# Patient Record
Sex: Male | Born: 1950 | Race: White | Hispanic: No | Marital: Married | Smoking: Former smoker
Health system: Southern US, Community
[De-identification: ages and names within clinical notes are randomized; demographics above are authoritative.]

## PROBLEM LIST (undated history)

## (undated) DIAGNOSIS — T7840XA Allergy, unspecified, initial encounter: Secondary | ICD-10-CM

## (undated) DIAGNOSIS — Z9889 Other specified postprocedural states: Secondary | ICD-10-CM

## (undated) DIAGNOSIS — M5412 Radiculopathy, cervical region: Secondary | ICD-10-CM

## (undated) DIAGNOSIS — I2510 Atherosclerotic heart disease of native coronary artery without angina pectoris: Secondary | ICD-10-CM

## (undated) DIAGNOSIS — T4145XA Adverse effect of unspecified anesthetic, initial encounter: Secondary | ICD-10-CM

## (undated) DIAGNOSIS — Z87442 Personal history of urinary calculi: Secondary | ICD-10-CM

## (undated) DIAGNOSIS — M1990 Unspecified osteoarthritis, unspecified site: Secondary | ICD-10-CM

## (undated) DIAGNOSIS — J189 Pneumonia, unspecified organism: Secondary | ICD-10-CM

## (undated) DIAGNOSIS — Z86718 Personal history of other venous thrombosis and embolism: Secondary | ICD-10-CM

## (undated) DIAGNOSIS — Z5189 Encounter for other specified aftercare: Secondary | ICD-10-CM

## (undated) DIAGNOSIS — J301 Allergic rhinitis due to pollen: Secondary | ICD-10-CM

## (undated) DIAGNOSIS — R112 Nausea with vomiting, unspecified: Secondary | ICD-10-CM

## (undated) DIAGNOSIS — G40909 Epilepsy, unspecified, not intractable, without status epilepticus: Secondary | ICD-10-CM

## (undated) DIAGNOSIS — N400 Enlarged prostate without lower urinary tract symptoms: Secondary | ICD-10-CM

## (undated) DIAGNOSIS — R569 Unspecified convulsions: Secondary | ICD-10-CM

## (undated) DIAGNOSIS — M797 Fibromyalgia: Secondary | ICD-10-CM

## (undated) DIAGNOSIS — G5601 Carpal tunnel syndrome, right upper limb: Secondary | ICD-10-CM

## (undated) DIAGNOSIS — I639 Cerebral infarction, unspecified: Secondary | ICD-10-CM

## (undated) DIAGNOSIS — T8859XA Other complications of anesthesia, initial encounter: Secondary | ICD-10-CM

## (undated) HISTORY — PX: BICEPS TENDON REPAIR: SHX566

## (undated) HISTORY — DX: Radiculopathy, cervical region: M54.12

## (undated) HISTORY — PX: ESOPHAGOGASTRODUODENOSCOPY: SHX1529

## (undated) HISTORY — DX: Other specified postprocedural states: Z98.890

## (undated) HISTORY — PX: SPINE SURGERY: SHX786

## (undated) HISTORY — DX: Epilepsy, unspecified, not intractable, without status epilepticus: G40.909

## (undated) HISTORY — DX: Allergy, unspecified, initial encounter: T78.40XA

## (undated) HISTORY — PX: CARDIAC VALVE REPLACEMENT: SHX585

## (undated) HISTORY — DX: Personal history of other venous thrombosis and embolism: Z86.718

## (undated) HISTORY — DX: Unspecified osteoarthritis, unspecified site: M19.90

## (undated) HISTORY — DX: Benign prostatic hyperplasia without lower urinary tract symptoms: N40.0

## (undated) HISTORY — DX: Cerebral infarction, unspecified: I63.9

## (undated) HISTORY — PX: JOINT REPLACEMENT: SHX530

## (undated) HISTORY — PX: TONSILLECTOMY: SUR1361

## (undated) HISTORY — PX: CARDIAC SURGERY: SHX584

## (undated) HISTORY — DX: Allergic rhinitis due to pollen: J30.1

## (undated) HISTORY — DX: Carpal tunnel syndrome, right upper limb: G56.01

## (undated) HISTORY — PX: ROTATOR CUFF REPAIR: SHX139

## (undated) HISTORY — DX: Encounter for other specified aftercare: Z51.89

## (undated) HISTORY — DX: Pneumonia, unspecified organism: J18.9

## (undated) HISTORY — PX: BACK SURGERY: SHX140

## (undated) HISTORY — DX: Unspecified convulsions: R56.9

---
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
## 1981-08-20 HISTORY — PX: KNEE SURGERY: SHX244

## 2013-08-20 HISTORY — PX: REPLACEMENT TOTAL KNEE: SUR1224

## 2014-08-20 HISTORY — PX: OTHER SURGICAL HISTORY: SHX169

## 2015-08-24 DIAGNOSIS — I712 Thoracic aortic aneurysm, without rupture: Secondary | ICD-10-CM | POA: Diagnosis not present

## 2015-08-24 DIAGNOSIS — I10 Essential (primary) hypertension: Secondary | ICD-10-CM | POA: Diagnosis not present

## 2015-09-02 DIAGNOSIS — I712 Thoracic aortic aneurysm, without rupture: Secondary | ICD-10-CM | POA: Diagnosis not present

## 2015-10-28 DIAGNOSIS — Z1389 Encounter for screening for other disorder: Secondary | ICD-10-CM | POA: Diagnosis not present

## 2015-10-28 DIAGNOSIS — R5383 Other fatigue: Secondary | ICD-10-CM | POA: Diagnosis not present

## 2015-10-28 DIAGNOSIS — A770 Spotted fever due to Rickettsia rickettsii: Secondary | ICD-10-CM | POA: Diagnosis not present

## 2015-10-28 DIAGNOSIS — M2550 Pain in unspecified joint: Secondary | ICD-10-CM | POA: Diagnosis not present

## 2015-11-09 DIAGNOSIS — I10 Essential (primary) hypertension: Secondary | ICD-10-CM | POA: Diagnosis not present

## 2015-11-09 DIAGNOSIS — R0602 Shortness of breath: Secondary | ICD-10-CM | POA: Diagnosis not present

## 2015-11-25 DIAGNOSIS — L299 Pruritus, unspecified: Secondary | ICD-10-CM | POA: Diagnosis not present

## 2015-11-25 DIAGNOSIS — N62 Hypertrophy of breast: Secondary | ICD-10-CM | POA: Diagnosis not present

## 2015-11-25 DIAGNOSIS — A770 Spotted fever due to Rickettsia rickettsii: Secondary | ICD-10-CM | POA: Diagnosis not present

## 2015-11-28 DIAGNOSIS — N6459 Other signs and symptoms in breast: Secondary | ICD-10-CM | POA: Diagnosis not present

## 2015-11-29 DIAGNOSIS — N62 Hypertrophy of breast: Secondary | ICD-10-CM | POA: Diagnosis not present

## 2015-11-29 DIAGNOSIS — E039 Hypothyroidism, unspecified: Secondary | ICD-10-CM | POA: Diagnosis not present

## 2015-12-15 DIAGNOSIS — A770 Spotted fever due to Rickettsia rickettsii: Secondary | ICD-10-CM | POA: Diagnosis not present

## 2015-12-15 DIAGNOSIS — L299 Pruritus, unspecified: Secondary | ICD-10-CM | POA: Diagnosis not present

## 2015-12-19 HISTORY — PX: CORONARY ARTERY BYPASS GRAFT: SHX141

## 2016-01-02 DIAGNOSIS — R079 Chest pain, unspecified: Secondary | ICD-10-CM | POA: Diagnosis not present

## 2016-01-02 DIAGNOSIS — I712 Thoracic aortic aneurysm, without rupture: Secondary | ICD-10-CM | POA: Diagnosis not present

## 2016-01-02 DIAGNOSIS — I313 Pericardial effusion (noninflammatory): Secondary | ICD-10-CM | POA: Diagnosis not present

## 2016-01-02 DIAGNOSIS — R9431 Abnormal electrocardiogram [ECG] [EKG]: Secondary | ICD-10-CM | POA: Diagnosis not present

## 2016-01-02 DIAGNOSIS — Z7901 Long term (current) use of anticoagulants: Secondary | ICD-10-CM | POA: Diagnosis not present

## 2016-01-02 DIAGNOSIS — Z951 Presence of aortocoronary bypass graft: Secondary | ICD-10-CM | POA: Diagnosis not present

## 2016-01-02 DIAGNOSIS — R1310 Dysphagia, unspecified: Secondary | ICD-10-CM | POA: Diagnosis not present

## 2016-01-02 DIAGNOSIS — J9811 Atelectasis: Secondary | ICD-10-CM | POA: Diagnosis not present

## 2016-01-02 DIAGNOSIS — I719 Aortic aneurysm of unspecified site, without rupture: Secondary | ICD-10-CM | POA: Diagnosis not present

## 2016-01-02 DIAGNOSIS — Z9889 Other specified postprocedural states: Secondary | ICD-10-CM | POA: Diagnosis not present

## 2016-01-02 DIAGNOSIS — A770 Spotted fever due to Rickettsia rickettsii: Secondary | ICD-10-CM | POA: Diagnosis not present

## 2016-01-02 DIAGNOSIS — J984 Other disorders of lung: Secondary | ICD-10-CM | POA: Diagnosis not present

## 2016-01-02 DIAGNOSIS — I352 Nonrheumatic aortic (valve) stenosis with insufficiency: Secondary | ICD-10-CM | POA: Diagnosis not present

## 2016-01-02 DIAGNOSIS — I2510 Atherosclerotic heart disease of native coronary artery without angina pectoris: Secondary | ICD-10-CM | POA: Diagnosis not present

## 2016-01-02 DIAGNOSIS — B338 Other specified viral diseases: Secondary | ICD-10-CM | POA: Diagnosis not present

## 2016-01-02 DIAGNOSIS — R69 Illness, unspecified: Secondary | ICD-10-CM | POA: Diagnosis not present

## 2016-01-02 DIAGNOSIS — Z4682 Encounter for fitting and adjustment of non-vascular catheter: Secondary | ICD-10-CM | POA: Diagnosis not present

## 2016-01-02 DIAGNOSIS — Z01818 Encounter for other preprocedural examination: Secondary | ICD-10-CM | POA: Diagnosis not present

## 2016-01-02 DIAGNOSIS — I9581 Postprocedural hypotension: Secondary | ICD-10-CM | POA: Diagnosis not present

## 2016-01-02 DIAGNOSIS — I10 Essential (primary) hypertension: Secondary | ICD-10-CM | POA: Diagnosis not present

## 2016-01-02 DIAGNOSIS — I718 Aortic aneurysm of unspecified site, ruptured: Secondary | ICD-10-CM | POA: Diagnosis not present

## 2016-01-02 DIAGNOSIS — I700 Atherosclerosis of aorta: Secondary | ICD-10-CM | POA: Diagnosis not present

## 2016-01-02 DIAGNOSIS — R0789 Other chest pain: Secondary | ICD-10-CM | POA: Diagnosis not present

## 2016-01-02 DIAGNOSIS — I714 Abdominal aortic aneurysm, without rupture: Secondary | ICD-10-CM | POA: Diagnosis not present

## 2016-01-02 DIAGNOSIS — Z743 Need for continuous supervision: Secondary | ICD-10-CM | POA: Diagnosis not present

## 2016-01-02 DIAGNOSIS — J90 Pleural effusion, not elsewhere classified: Secondary | ICD-10-CM | POA: Diagnosis not present

## 2016-01-02 DIAGNOSIS — D72829 Elevated white blood cell count, unspecified: Secondary | ICD-10-CM | POA: Diagnosis not present

## 2016-01-02 DIAGNOSIS — R0989 Other specified symptoms and signs involving the circulatory and respiratory systems: Secondary | ICD-10-CM | POA: Diagnosis not present

## 2016-01-02 DIAGNOSIS — I351 Nonrheumatic aortic (valve) insufficiency: Secondary | ICD-10-CM | POA: Diagnosis not present

## 2016-01-02 DIAGNOSIS — Z87891 Personal history of nicotine dependence: Secondary | ICD-10-CM | POA: Diagnosis not present

## 2016-01-02 DIAGNOSIS — Z86711 Personal history of pulmonary embolism: Secondary | ICD-10-CM | POA: Diagnosis not present

## 2016-01-02 DIAGNOSIS — Z452 Encounter for adjustment and management of vascular access device: Secondary | ICD-10-CM | POA: Diagnosis not present

## 2016-01-02 DIAGNOSIS — I358 Other nonrheumatic aortic valve disorders: Secondary | ICD-10-CM | POA: Diagnosis not present

## 2016-01-02 DIAGNOSIS — J951 Acute pulmonary insufficiency following thoracic surgery: Secondary | ICD-10-CM | POA: Diagnosis not present

## 2016-01-02 DIAGNOSIS — I359 Nonrheumatic aortic valve disorder, unspecified: Secondary | ICD-10-CM | POA: Diagnosis not present

## 2016-01-02 DIAGNOSIS — Z96652 Presence of left artificial knee joint: Secondary | ICD-10-CM | POA: Diagnosis not present

## 2016-01-04 HISTORY — PX: CORONARY ARTERY BYPASS GRAFT: SHX141

## 2016-01-04 HISTORY — PX: BENTALL PROCEDURE: SHX5058

## 2016-01-11 DIAGNOSIS — I2510 Atherosclerotic heart disease of native coronary artery without angina pectoris: Secondary | ICD-10-CM | POA: Diagnosis not present

## 2016-01-11 DIAGNOSIS — I10 Essential (primary) hypertension: Secondary | ICD-10-CM | POA: Diagnosis not present

## 2016-01-11 DIAGNOSIS — Z48812 Encounter for surgical aftercare following surgery on the circulatory system: Secondary | ICD-10-CM | POA: Diagnosis not present

## 2016-01-12 DIAGNOSIS — Z951 Presence of aortocoronary bypass graft: Secondary | ICD-10-CM | POA: Diagnosis not present

## 2016-01-12 DIAGNOSIS — Z952 Presence of prosthetic heart valve: Secondary | ICD-10-CM | POA: Diagnosis not present

## 2016-01-12 DIAGNOSIS — I712 Thoracic aortic aneurysm, without rupture: Secondary | ICD-10-CM | POA: Diagnosis not present

## 2016-01-12 DIAGNOSIS — I351 Nonrheumatic aortic (valve) insufficiency: Secondary | ICD-10-CM | POA: Diagnosis not present

## 2016-01-13 DIAGNOSIS — J90 Pleural effusion, not elsewhere classified: Secondary | ICD-10-CM | POA: Diagnosis not present

## 2016-01-13 DIAGNOSIS — R0602 Shortness of breath: Secondary | ICD-10-CM | POA: Diagnosis not present

## 2016-01-13 DIAGNOSIS — I712 Thoracic aortic aneurysm, without rupture: Secondary | ICD-10-CM | POA: Diagnosis not present

## 2016-01-13 DIAGNOSIS — Z952 Presence of prosthetic heart valve: Secondary | ICD-10-CM | POA: Diagnosis not present

## 2016-01-13 DIAGNOSIS — N6459 Other signs and symptoms in breast: Secondary | ICD-10-CM | POA: Diagnosis not present

## 2016-01-14 DIAGNOSIS — Z48812 Encounter for surgical aftercare following surgery on the circulatory system: Secondary | ICD-10-CM | POA: Diagnosis not present

## 2016-01-14 DIAGNOSIS — I10 Essential (primary) hypertension: Secondary | ICD-10-CM | POA: Diagnosis not present

## 2016-01-14 DIAGNOSIS — I2510 Atherosclerotic heart disease of native coronary artery without angina pectoris: Secondary | ICD-10-CM | POA: Diagnosis not present

## 2016-01-17 DIAGNOSIS — I10 Essential (primary) hypertension: Secondary | ICD-10-CM | POA: Diagnosis not present

## 2016-01-17 DIAGNOSIS — Z48812 Encounter for surgical aftercare following surgery on the circulatory system: Secondary | ICD-10-CM | POA: Diagnosis not present

## 2016-01-17 DIAGNOSIS — I2510 Atherosclerotic heart disease of native coronary artery without angina pectoris: Secondary | ICD-10-CM | POA: Diagnosis not present

## 2016-01-20 DIAGNOSIS — R05 Cough: Secondary | ICD-10-CM | POA: Diagnosis not present

## 2016-01-20 DIAGNOSIS — R0602 Shortness of breath: Secondary | ICD-10-CM | POA: Diagnosis not present

## 2016-01-20 DIAGNOSIS — Z952 Presence of prosthetic heart valve: Secondary | ICD-10-CM | POA: Diagnosis not present

## 2016-01-23 DIAGNOSIS — J9811 Atelectasis: Secondary | ICD-10-CM | POA: Diagnosis not present

## 2016-01-23 DIAGNOSIS — N6459 Other signs and symptoms in breast: Secondary | ICD-10-CM | POA: Diagnosis not present

## 2016-01-23 DIAGNOSIS — J90 Pleural effusion, not elsewhere classified: Secondary | ICD-10-CM | POA: Diagnosis not present

## 2016-01-24 DIAGNOSIS — R0600 Dyspnea, unspecified: Secondary | ICD-10-CM | POA: Diagnosis not present

## 2016-01-24 DIAGNOSIS — I712 Thoracic aortic aneurysm, without rupture: Secondary | ICD-10-CM | POA: Diagnosis not present

## 2016-01-24 DIAGNOSIS — R9431 Abnormal electrocardiogram [ECG] [EKG]: Secondary | ICD-10-CM | POA: Diagnosis not present

## 2016-01-24 DIAGNOSIS — I2510 Atherosclerotic heart disease of native coronary artery without angina pectoris: Secondary | ICD-10-CM | POA: Diagnosis not present

## 2016-01-24 DIAGNOSIS — Z953 Presence of xenogenic heart valve: Secondary | ICD-10-CM | POA: Diagnosis not present

## 2016-01-24 DIAGNOSIS — I110 Hypertensive heart disease with heart failure: Secondary | ICD-10-CM | POA: Diagnosis not present

## 2016-01-24 DIAGNOSIS — Z951 Presence of aortocoronary bypass graft: Secondary | ICD-10-CM | POA: Diagnosis not present

## 2016-01-24 DIAGNOSIS — R0602 Shortness of breath: Secondary | ICD-10-CM | POA: Diagnosis not present

## 2016-01-24 DIAGNOSIS — Y839 Surgical procedure, unspecified as the cause of abnormal reaction of the patient, or of later complication, without mention of misadventure at the time of the procedure: Secondary | ICD-10-CM | POA: Diagnosis not present

## 2016-01-24 DIAGNOSIS — J90 Pleural effusion, not elsewhere classified: Secondary | ICD-10-CM | POA: Diagnosis not present

## 2016-01-24 DIAGNOSIS — I951 Orthostatic hypotension: Secondary | ICD-10-CM | POA: Diagnosis not present

## 2016-01-24 DIAGNOSIS — I716 Thoracoabdominal aortic aneurysm, without rupture: Secondary | ICD-10-CM | POA: Diagnosis not present

## 2016-01-24 DIAGNOSIS — I313 Pericardial effusion (noninflammatory): Secondary | ICD-10-CM | POA: Diagnosis not present

## 2016-01-24 DIAGNOSIS — Z952 Presence of prosthetic heart valve: Secondary | ICD-10-CM | POA: Diagnosis not present

## 2016-01-24 DIAGNOSIS — I5032 Chronic diastolic (congestive) heart failure: Secondary | ICD-10-CM | POA: Diagnosis not present

## 2016-01-24 DIAGNOSIS — J9811 Atelectasis: Secondary | ICD-10-CM | POA: Diagnosis not present

## 2016-01-24 DIAGNOSIS — R918 Other nonspecific abnormal finding of lung field: Secondary | ICD-10-CM | POA: Diagnosis not present

## 2016-01-24 DIAGNOSIS — I351 Nonrheumatic aortic (valve) insufficiency: Secondary | ICD-10-CM | POA: Diagnosis not present

## 2016-01-24 DIAGNOSIS — J9589 Other postprocedural complications and disorders of respiratory system, not elsewhere classified: Secondary | ICD-10-CM | POA: Diagnosis not present

## 2016-01-31 DIAGNOSIS — I2510 Atherosclerotic heart disease of native coronary artery without angina pectoris: Secondary | ICD-10-CM | POA: Diagnosis not present

## 2016-01-31 DIAGNOSIS — I10 Essential (primary) hypertension: Secondary | ICD-10-CM | POA: Diagnosis not present

## 2016-01-31 DIAGNOSIS — Z48812 Encounter for surgical aftercare following surgery on the circulatory system: Secondary | ICD-10-CM | POA: Diagnosis not present

## 2016-02-01 DIAGNOSIS — Z952 Presence of prosthetic heart valve: Secondary | ICD-10-CM | POA: Diagnosis not present

## 2016-02-01 DIAGNOSIS — R7989 Other specified abnormal findings of blood chemistry: Secondary | ICD-10-CM | POA: Diagnosis not present

## 2016-02-01 DIAGNOSIS — Z79899 Other long term (current) drug therapy: Secondary | ICD-10-CM | POA: Diagnosis not present

## 2016-02-01 DIAGNOSIS — Z951 Presence of aortocoronary bypass graft: Secondary | ICD-10-CM | POA: Diagnosis not present

## 2016-02-01 DIAGNOSIS — E782 Mixed hyperlipidemia: Secondary | ICD-10-CM | POA: Diagnosis not present

## 2016-02-01 DIAGNOSIS — R079 Chest pain, unspecified: Secondary | ICD-10-CM | POA: Diagnosis not present

## 2016-02-03 DIAGNOSIS — E2681 Bartter's syndrome: Secondary | ICD-10-CM | POA: Diagnosis not present

## 2016-02-03 DIAGNOSIS — Z48812 Encounter for surgical aftercare following surgery on the circulatory system: Secondary | ICD-10-CM | POA: Diagnosis not present

## 2016-02-03 DIAGNOSIS — I10 Essential (primary) hypertension: Secondary | ICD-10-CM | POA: Diagnosis not present

## 2016-02-03 DIAGNOSIS — Z952 Presence of prosthetic heart valve: Secondary | ICD-10-CM | POA: Diagnosis not present

## 2016-02-03 DIAGNOSIS — I2510 Atherosclerotic heart disease of native coronary artery without angina pectoris: Secondary | ICD-10-CM | POA: Diagnosis not present

## 2016-02-03 DIAGNOSIS — R0602 Shortness of breath: Secondary | ICD-10-CM | POA: Diagnosis not present

## 2016-02-10 DIAGNOSIS — R0602 Shortness of breath: Secondary | ICD-10-CM | POA: Diagnosis not present

## 2016-02-10 DIAGNOSIS — J918 Pleural effusion in other conditions classified elsewhere: Secondary | ICD-10-CM | POA: Diagnosis not present

## 2016-02-10 DIAGNOSIS — M940 Chondrocostal junction syndrome [Tietze]: Secondary | ICD-10-CM | POA: Diagnosis not present

## 2016-02-17 DIAGNOSIS — Z952 Presence of prosthetic heart valve: Secondary | ICD-10-CM | POA: Diagnosis not present

## 2016-02-17 DIAGNOSIS — I2510 Atherosclerotic heart disease of native coronary artery without angina pectoris: Secondary | ICD-10-CM | POA: Diagnosis not present

## 2016-02-17 DIAGNOSIS — Z0000 Encounter for general adult medical examination without abnormal findings: Secondary | ICD-10-CM | POA: Diagnosis not present

## 2016-02-17 DIAGNOSIS — Z951 Presence of aortocoronary bypass graft: Secondary | ICD-10-CM | POA: Diagnosis not present

## 2016-02-20 DIAGNOSIS — I2510 Atherosclerotic heart disease of native coronary artery without angina pectoris: Secondary | ICD-10-CM | POA: Diagnosis not present

## 2016-02-20 DIAGNOSIS — Z951 Presence of aortocoronary bypass graft: Secondary | ICD-10-CM | POA: Diagnosis not present

## 2016-02-20 DIAGNOSIS — Z952 Presence of prosthetic heart valve: Secondary | ICD-10-CM | POA: Diagnosis not present

## 2016-02-22 DIAGNOSIS — Z951 Presence of aortocoronary bypass graft: Secondary | ICD-10-CM | POA: Diagnosis not present

## 2016-02-22 DIAGNOSIS — I2510 Atherosclerotic heart disease of native coronary artery without angina pectoris: Secondary | ICD-10-CM | POA: Diagnosis not present

## 2016-02-22 DIAGNOSIS — Z952 Presence of prosthetic heart valve: Secondary | ICD-10-CM | POA: Diagnosis not present

## 2016-02-24 DIAGNOSIS — I712 Thoracic aortic aneurysm, without rupture: Secondary | ICD-10-CM | POA: Diagnosis not present

## 2016-02-24 DIAGNOSIS — Z951 Presence of aortocoronary bypass graft: Secondary | ICD-10-CM | POA: Diagnosis not present

## 2016-02-24 DIAGNOSIS — Z952 Presence of prosthetic heart valve: Secondary | ICD-10-CM | POA: Diagnosis not present

## 2016-02-24 DIAGNOSIS — I2510 Atherosclerotic heart disease of native coronary artery without angina pectoris: Secondary | ICD-10-CM | POA: Diagnosis not present

## 2016-02-24 DIAGNOSIS — I25700 Atherosclerosis of coronary artery bypass graft(s), unspecified, with unstable angina pectoris: Secondary | ICD-10-CM | POA: Diagnosis not present

## 2016-02-27 DIAGNOSIS — Z952 Presence of prosthetic heart valve: Secondary | ICD-10-CM | POA: Diagnosis not present

## 2016-02-27 DIAGNOSIS — Z951 Presence of aortocoronary bypass graft: Secondary | ICD-10-CM | POA: Diagnosis not present

## 2016-02-27 DIAGNOSIS — I2510 Atherosclerotic heart disease of native coronary artery without angina pectoris: Secondary | ICD-10-CM | POA: Diagnosis not present

## 2016-02-29 DIAGNOSIS — Z951 Presence of aortocoronary bypass graft: Secondary | ICD-10-CM | POA: Diagnosis not present

## 2016-02-29 DIAGNOSIS — Z952 Presence of prosthetic heart valve: Secondary | ICD-10-CM | POA: Diagnosis not present

## 2016-02-29 DIAGNOSIS — I2510 Atherosclerotic heart disease of native coronary artery without angina pectoris: Secondary | ICD-10-CM | POA: Diagnosis not present

## 2016-03-02 DIAGNOSIS — I2510 Atherosclerotic heart disease of native coronary artery without angina pectoris: Secondary | ICD-10-CM | POA: Diagnosis not present

## 2016-03-02 DIAGNOSIS — Z952 Presence of prosthetic heart valve: Secondary | ICD-10-CM | POA: Diagnosis not present

## 2016-03-02 DIAGNOSIS — R05 Cough: Secondary | ICD-10-CM | POA: Diagnosis not present

## 2016-03-02 DIAGNOSIS — I208 Other forms of angina pectoris: Secondary | ICD-10-CM | POA: Diagnosis not present

## 2016-03-02 DIAGNOSIS — Z951 Presence of aortocoronary bypass graft: Secondary | ICD-10-CM | POA: Diagnosis not present

## 2016-03-02 DIAGNOSIS — D649 Anemia, unspecified: Secondary | ICD-10-CM | POA: Diagnosis not present

## 2016-03-02 DIAGNOSIS — E611 Iron deficiency: Secondary | ICD-10-CM | POA: Diagnosis not present

## 2016-03-05 DIAGNOSIS — Z952 Presence of prosthetic heart valve: Secondary | ICD-10-CM | POA: Diagnosis not present

## 2016-03-05 DIAGNOSIS — I2510 Atherosclerotic heart disease of native coronary artery without angina pectoris: Secondary | ICD-10-CM | POA: Diagnosis not present

## 2016-03-05 DIAGNOSIS — Z951 Presence of aortocoronary bypass graft: Secondary | ICD-10-CM | POA: Diagnosis not present

## 2016-03-07 DIAGNOSIS — Z951 Presence of aortocoronary bypass graft: Secondary | ICD-10-CM | POA: Diagnosis not present

## 2016-03-07 DIAGNOSIS — I2510 Atherosclerotic heart disease of native coronary artery without angina pectoris: Secondary | ICD-10-CM | POA: Diagnosis not present

## 2016-03-07 DIAGNOSIS — Z952 Presence of prosthetic heart valve: Secondary | ICD-10-CM | POA: Diagnosis not present

## 2016-03-09 DIAGNOSIS — I2510 Atherosclerotic heart disease of native coronary artery without angina pectoris: Secondary | ICD-10-CM | POA: Diagnosis not present

## 2016-03-09 DIAGNOSIS — Z951 Presence of aortocoronary bypass graft: Secondary | ICD-10-CM | POA: Diagnosis not present

## 2016-03-09 DIAGNOSIS — Z952 Presence of prosthetic heart valve: Secondary | ICD-10-CM | POA: Diagnosis not present

## 2016-03-12 DIAGNOSIS — Z951 Presence of aortocoronary bypass graft: Secondary | ICD-10-CM | POA: Diagnosis not present

## 2016-03-12 DIAGNOSIS — Z952 Presence of prosthetic heart valve: Secondary | ICD-10-CM | POA: Diagnosis not present

## 2016-03-12 DIAGNOSIS — I2510 Atherosclerotic heart disease of native coronary artery without angina pectoris: Secondary | ICD-10-CM | POA: Diagnosis not present

## 2016-03-14 DIAGNOSIS — I2510 Atherosclerotic heart disease of native coronary artery without angina pectoris: Secondary | ICD-10-CM | POA: Diagnosis not present

## 2016-03-14 DIAGNOSIS — Z952 Presence of prosthetic heart valve: Secondary | ICD-10-CM | POA: Diagnosis not present

## 2016-03-14 DIAGNOSIS — Z951 Presence of aortocoronary bypass graft: Secondary | ICD-10-CM | POA: Diagnosis not present

## 2016-03-16 DIAGNOSIS — Z951 Presence of aortocoronary bypass graft: Secondary | ICD-10-CM | POA: Diagnosis not present

## 2016-03-16 DIAGNOSIS — N6459 Other signs and symptoms in breast: Secondary | ICD-10-CM | POA: Diagnosis not present

## 2016-03-16 DIAGNOSIS — R0781 Pleurodynia: Secondary | ICD-10-CM | POA: Diagnosis not present

## 2016-03-16 DIAGNOSIS — Z952 Presence of prosthetic heart valve: Secondary | ICD-10-CM | POA: Diagnosis not present

## 2016-03-16 DIAGNOSIS — J9811 Atelectasis: Secondary | ICD-10-CM | POA: Diagnosis not present

## 2016-03-16 DIAGNOSIS — J90 Pleural effusion, not elsewhere classified: Secondary | ICD-10-CM | POA: Diagnosis not present

## 2016-03-16 DIAGNOSIS — I2510 Atherosclerotic heart disease of native coronary artery without angina pectoris: Secondary | ICD-10-CM | POA: Diagnosis not present

## 2016-03-16 DIAGNOSIS — Z955 Presence of coronary angioplasty implant and graft: Secondary | ICD-10-CM | POA: Diagnosis not present

## 2016-03-16 DIAGNOSIS — I208 Other forms of angina pectoris: Secondary | ICD-10-CM | POA: Diagnosis not present

## 2016-03-19 DIAGNOSIS — I2510 Atherosclerotic heart disease of native coronary artery without angina pectoris: Secondary | ICD-10-CM | POA: Diagnosis not present

## 2016-03-19 DIAGNOSIS — Z951 Presence of aortocoronary bypass graft: Secondary | ICD-10-CM | POA: Diagnosis not present

## 2016-03-19 DIAGNOSIS — Z952 Presence of prosthetic heart valve: Secondary | ICD-10-CM | POA: Diagnosis not present

## 2016-03-21 DIAGNOSIS — Z951 Presence of aortocoronary bypass graft: Secondary | ICD-10-CM | POA: Diagnosis not present

## 2016-03-21 DIAGNOSIS — I2510 Atherosclerotic heart disease of native coronary artery without angina pectoris: Secondary | ICD-10-CM | POA: Diagnosis not present

## 2016-03-21 DIAGNOSIS — Z952 Presence of prosthetic heart valve: Secondary | ICD-10-CM | POA: Diagnosis not present

## 2016-03-23 DIAGNOSIS — I2510 Atherosclerotic heart disease of native coronary artery without angina pectoris: Secondary | ICD-10-CM | POA: Diagnosis not present

## 2016-03-23 DIAGNOSIS — Z952 Presence of prosthetic heart valve: Secondary | ICD-10-CM | POA: Diagnosis not present

## 2016-03-23 DIAGNOSIS — Z951 Presence of aortocoronary bypass graft: Secondary | ICD-10-CM | POA: Diagnosis not present

## 2016-03-26 DIAGNOSIS — M79662 Pain in left lower leg: Secondary | ICD-10-CM | POA: Diagnosis not present

## 2016-03-26 DIAGNOSIS — Z881 Allergy status to other antibiotic agents status: Secondary | ICD-10-CM | POA: Diagnosis not present

## 2016-03-26 DIAGNOSIS — R609 Edema, unspecified: Secondary | ICD-10-CM | POA: Diagnosis not present

## 2016-03-26 DIAGNOSIS — M25562 Pain in left knee: Secondary | ICD-10-CM | POA: Diagnosis not present

## 2016-03-26 DIAGNOSIS — M79604 Pain in right leg: Secondary | ICD-10-CM | POA: Diagnosis not present

## 2016-03-26 DIAGNOSIS — M79661 Pain in right lower leg: Secondary | ICD-10-CM | POA: Diagnosis not present

## 2016-03-26 DIAGNOSIS — Z885 Allergy status to narcotic agent status: Secondary | ICD-10-CM | POA: Diagnosis not present

## 2016-03-26 DIAGNOSIS — Z888 Allergy status to other drugs, medicaments and biological substances status: Secondary | ICD-10-CM | POA: Diagnosis not present

## 2016-03-26 DIAGNOSIS — Z79899 Other long term (current) drug therapy: Secondary | ICD-10-CM | POA: Diagnosis not present

## 2016-03-26 DIAGNOSIS — M7989 Other specified soft tissue disorders: Secondary | ICD-10-CM | POA: Diagnosis not present

## 2016-03-26 DIAGNOSIS — M79605 Pain in left leg: Secondary | ICD-10-CM | POA: Diagnosis not present

## 2016-03-26 DIAGNOSIS — Z96652 Presence of left artificial knee joint: Secondary | ICD-10-CM | POA: Diagnosis not present

## 2016-03-26 DIAGNOSIS — I10 Essential (primary) hypertension: Secondary | ICD-10-CM | POA: Diagnosis not present

## 2016-03-26 DIAGNOSIS — Z87891 Personal history of nicotine dependence: Secondary | ICD-10-CM | POA: Diagnosis not present

## 2016-03-31 DIAGNOSIS — K644 Residual hemorrhoidal skin tags: Secondary | ICD-10-CM | POA: Diagnosis not present

## 2016-03-31 DIAGNOSIS — N401 Enlarged prostate with lower urinary tract symptoms: Secondary | ICD-10-CM | POA: Diagnosis not present

## 2016-03-31 DIAGNOSIS — N410 Acute prostatitis: Secondary | ICD-10-CM | POA: Diagnosis not present

## 2016-04-02 DIAGNOSIS — I2510 Atherosclerotic heart disease of native coronary artery without angina pectoris: Secondary | ICD-10-CM | POA: Diagnosis not present

## 2016-04-02 DIAGNOSIS — Z951 Presence of aortocoronary bypass graft: Secondary | ICD-10-CM | POA: Diagnosis not present

## 2016-04-02 DIAGNOSIS — Z952 Presence of prosthetic heart valve: Secondary | ICD-10-CM | POA: Diagnosis not present

## 2016-04-04 DIAGNOSIS — I2510 Atherosclerotic heart disease of native coronary artery without angina pectoris: Secondary | ICD-10-CM | POA: Diagnosis not present

## 2016-04-04 DIAGNOSIS — Z952 Presence of prosthetic heart valve: Secondary | ICD-10-CM | POA: Diagnosis not present

## 2016-04-04 DIAGNOSIS — Z951 Presence of aortocoronary bypass graft: Secondary | ICD-10-CM | POA: Diagnosis not present

## 2016-04-06 DIAGNOSIS — K625 Hemorrhage of anus and rectum: Secondary | ICD-10-CM | POA: Diagnosis not present

## 2016-04-06 DIAGNOSIS — E782 Mixed hyperlipidemia: Secondary | ICD-10-CM | POA: Diagnosis not present

## 2016-04-06 DIAGNOSIS — E039 Hypothyroidism, unspecified: Secondary | ICD-10-CM | POA: Diagnosis not present

## 2016-04-06 DIAGNOSIS — N410 Acute prostatitis: Secondary | ICD-10-CM | POA: Diagnosis not present

## 2016-04-06 DIAGNOSIS — R5383 Other fatigue: Secondary | ICD-10-CM | POA: Diagnosis not present

## 2016-04-06 DIAGNOSIS — S39011A Strain of muscle, fascia and tendon of abdomen, initial encounter: Secondary | ICD-10-CM | POA: Diagnosis not present

## 2016-04-09 DIAGNOSIS — Z951 Presence of aortocoronary bypass graft: Secondary | ICD-10-CM | POA: Diagnosis not present

## 2016-04-09 DIAGNOSIS — Z952 Presence of prosthetic heart valve: Secondary | ICD-10-CM | POA: Diagnosis not present

## 2016-04-09 DIAGNOSIS — Z471 Aftercare following joint replacement surgery: Secondary | ICD-10-CM | POA: Diagnosis not present

## 2016-04-09 DIAGNOSIS — M1711 Unilateral primary osteoarthritis, right knee: Secondary | ICD-10-CM | POA: Diagnosis not present

## 2016-04-09 DIAGNOSIS — M25561 Pain in right knee: Secondary | ICD-10-CM | POA: Diagnosis not present

## 2016-04-09 DIAGNOSIS — I2510 Atherosclerotic heart disease of native coronary artery without angina pectoris: Secondary | ICD-10-CM | POA: Diagnosis not present

## 2016-04-11 DIAGNOSIS — Z951 Presence of aortocoronary bypass graft: Secondary | ICD-10-CM | POA: Diagnosis not present

## 2016-04-11 DIAGNOSIS — Z952 Presence of prosthetic heart valve: Secondary | ICD-10-CM | POA: Diagnosis not present

## 2016-04-11 DIAGNOSIS — I2510 Atherosclerotic heart disease of native coronary artery without angina pectoris: Secondary | ICD-10-CM | POA: Diagnosis not present

## 2016-04-13 DIAGNOSIS — Z951 Presence of aortocoronary bypass graft: Secondary | ICD-10-CM | POA: Diagnosis not present

## 2016-04-13 DIAGNOSIS — I2510 Atherosclerotic heart disease of native coronary artery without angina pectoris: Secondary | ICD-10-CM | POA: Diagnosis not present

## 2016-04-13 DIAGNOSIS — Z952 Presence of prosthetic heart valve: Secondary | ICD-10-CM | POA: Diagnosis not present

## 2016-04-16 DIAGNOSIS — I2510 Atherosclerotic heart disease of native coronary artery without angina pectoris: Secondary | ICD-10-CM | POA: Diagnosis not present

## 2016-04-16 DIAGNOSIS — Z951 Presence of aortocoronary bypass graft: Secondary | ICD-10-CM | POA: Diagnosis not present

## 2016-04-16 DIAGNOSIS — Z952 Presence of prosthetic heart valve: Secondary | ICD-10-CM | POA: Diagnosis not present

## 2016-04-18 DIAGNOSIS — Z952 Presence of prosthetic heart valve: Secondary | ICD-10-CM | POA: Diagnosis not present

## 2016-04-18 DIAGNOSIS — I2510 Atherosclerotic heart disease of native coronary artery without angina pectoris: Secondary | ICD-10-CM | POA: Diagnosis not present

## 2016-04-18 DIAGNOSIS — Z951 Presence of aortocoronary bypass graft: Secondary | ICD-10-CM | POA: Diagnosis not present

## 2016-04-20 DIAGNOSIS — Z952 Presence of prosthetic heart valve: Secondary | ICD-10-CM | POA: Diagnosis not present

## 2016-04-20 DIAGNOSIS — Z951 Presence of aortocoronary bypass graft: Secondary | ICD-10-CM | POA: Diagnosis not present

## 2016-04-20 DIAGNOSIS — I2510 Atherosclerotic heart disease of native coronary artery without angina pectoris: Secondary | ICD-10-CM | POA: Diagnosis not present

## 2016-04-24 DIAGNOSIS — Z951 Presence of aortocoronary bypass graft: Secondary | ICD-10-CM | POA: Diagnosis not present

## 2016-04-24 DIAGNOSIS — Z952 Presence of prosthetic heart valve: Secondary | ICD-10-CM | POA: Diagnosis not present

## 2016-04-24 DIAGNOSIS — I2510 Atherosclerotic heart disease of native coronary artery without angina pectoris: Secondary | ICD-10-CM | POA: Diagnosis not present

## 2016-04-25 DIAGNOSIS — I2510 Atherosclerotic heart disease of native coronary artery without angina pectoris: Secondary | ICD-10-CM | POA: Diagnosis not present

## 2016-04-25 DIAGNOSIS — Z952 Presence of prosthetic heart valve: Secondary | ICD-10-CM | POA: Diagnosis not present

## 2016-04-25 DIAGNOSIS — Z951 Presence of aortocoronary bypass graft: Secondary | ICD-10-CM | POA: Diagnosis not present

## 2016-04-27 DIAGNOSIS — R972 Elevated prostate specific antigen [PSA]: Secondary | ICD-10-CM | POA: Diagnosis not present

## 2016-04-27 DIAGNOSIS — Z952 Presence of prosthetic heart valve: Secondary | ICD-10-CM | POA: Diagnosis not present

## 2016-04-27 DIAGNOSIS — I2510 Atherosclerotic heart disease of native coronary artery without angina pectoris: Secondary | ICD-10-CM | POA: Diagnosis not present

## 2016-04-27 DIAGNOSIS — Z951 Presence of aortocoronary bypass graft: Secondary | ICD-10-CM | POA: Diagnosis not present

## 2016-04-30 DIAGNOSIS — Z952 Presence of prosthetic heart valve: Secondary | ICD-10-CM | POA: Diagnosis not present

## 2016-04-30 DIAGNOSIS — Z951 Presence of aortocoronary bypass graft: Secondary | ICD-10-CM | POA: Diagnosis not present

## 2016-04-30 DIAGNOSIS — I2510 Atherosclerotic heart disease of native coronary artery without angina pectoris: Secondary | ICD-10-CM | POA: Diagnosis not present

## 2016-05-02 DIAGNOSIS — Z952 Presence of prosthetic heart valve: Secondary | ICD-10-CM | POA: Diagnosis not present

## 2016-05-02 DIAGNOSIS — Z951 Presence of aortocoronary bypass graft: Secondary | ICD-10-CM | POA: Diagnosis not present

## 2016-05-02 DIAGNOSIS — I2510 Atherosclerotic heart disease of native coronary artery without angina pectoris: Secondary | ICD-10-CM | POA: Diagnosis not present

## 2016-05-04 DIAGNOSIS — Z951 Presence of aortocoronary bypass graft: Secondary | ICD-10-CM | POA: Diagnosis not present

## 2016-05-04 DIAGNOSIS — Z952 Presence of prosthetic heart valve: Secondary | ICD-10-CM | POA: Diagnosis not present

## 2016-05-04 DIAGNOSIS — I2510 Atherosclerotic heart disease of native coronary artery without angina pectoris: Secondary | ICD-10-CM | POA: Diagnosis not present

## 2016-05-07 DIAGNOSIS — I2510 Atherosclerotic heart disease of native coronary artery without angina pectoris: Secondary | ICD-10-CM | POA: Diagnosis not present

## 2016-05-07 DIAGNOSIS — Z951 Presence of aortocoronary bypass graft: Secondary | ICD-10-CM | POA: Diagnosis not present

## 2016-05-07 DIAGNOSIS — Z952 Presence of prosthetic heart valve: Secondary | ICD-10-CM | POA: Diagnosis not present

## 2016-05-11 DIAGNOSIS — Z952 Presence of prosthetic heart valve: Secondary | ICD-10-CM | POA: Diagnosis not present

## 2016-05-11 DIAGNOSIS — Z951 Presence of aortocoronary bypass graft: Secondary | ICD-10-CM | POA: Diagnosis not present

## 2016-05-11 DIAGNOSIS — I2510 Atherosclerotic heart disease of native coronary artery without angina pectoris: Secondary | ICD-10-CM | POA: Diagnosis not present

## 2016-05-14 DIAGNOSIS — I2510 Atherosclerotic heart disease of native coronary artery without angina pectoris: Secondary | ICD-10-CM | POA: Diagnosis not present

## 2016-05-14 DIAGNOSIS — Z952 Presence of prosthetic heart valve: Secondary | ICD-10-CM | POA: Diagnosis not present

## 2016-05-14 DIAGNOSIS — Z951 Presence of aortocoronary bypass graft: Secondary | ICD-10-CM | POA: Diagnosis not present

## 2016-05-16 DIAGNOSIS — Z952 Presence of prosthetic heart valve: Secondary | ICD-10-CM | POA: Diagnosis not present

## 2016-05-16 DIAGNOSIS — I2510 Atherosclerotic heart disease of native coronary artery without angina pectoris: Secondary | ICD-10-CM | POA: Diagnosis not present

## 2016-05-16 DIAGNOSIS — Z951 Presence of aortocoronary bypass graft: Secondary | ICD-10-CM | POA: Diagnosis not present

## 2016-05-18 DIAGNOSIS — I2510 Atherosclerotic heart disease of native coronary artery without angina pectoris: Secondary | ICD-10-CM | POA: Diagnosis not present

## 2016-05-18 DIAGNOSIS — Z951 Presence of aortocoronary bypass graft: Secondary | ICD-10-CM | POA: Diagnosis not present

## 2016-05-18 DIAGNOSIS — Z952 Presence of prosthetic heart valve: Secondary | ICD-10-CM | POA: Diagnosis not present

## 2016-05-21 DIAGNOSIS — Z951 Presence of aortocoronary bypass graft: Secondary | ICD-10-CM | POA: Diagnosis not present

## 2016-05-21 DIAGNOSIS — Z952 Presence of prosthetic heart valve: Secondary | ICD-10-CM | POA: Diagnosis not present

## 2016-05-21 DIAGNOSIS — I2510 Atherosclerotic heart disease of native coronary artery without angina pectoris: Secondary | ICD-10-CM | POA: Diagnosis not present

## 2016-05-24 DIAGNOSIS — N6459 Other signs and symptoms in breast: Secondary | ICD-10-CM | POA: Diagnosis not present

## 2016-05-24 DIAGNOSIS — Z23 Encounter for immunization: Secondary | ICD-10-CM | POA: Diagnosis not present

## 2016-05-24 DIAGNOSIS — K439 Ventral hernia without obstruction or gangrene: Secondary | ICD-10-CM | POA: Diagnosis not present

## 2016-05-24 DIAGNOSIS — M4307 Spondylolysis, lumbosacral region: Secondary | ICD-10-CM | POA: Diagnosis not present

## 2016-05-24 DIAGNOSIS — R0781 Pleurodynia: Secondary | ICD-10-CM | POA: Diagnosis not present

## 2016-05-24 DIAGNOSIS — Z1389 Encounter for screening for other disorder: Secondary | ICD-10-CM | POA: Diagnosis not present

## 2016-05-24 DIAGNOSIS — J9811 Atelectasis: Secondary | ICD-10-CM | POA: Diagnosis not present

## 2016-05-24 DIAGNOSIS — D485 Neoplasm of uncertain behavior of skin: Secondary | ICD-10-CM | POA: Diagnosis not present

## 2016-05-24 DIAGNOSIS — Z6827 Body mass index (BMI) 27.0-27.9, adult: Secondary | ICD-10-CM | POA: Diagnosis not present

## 2016-05-24 DIAGNOSIS — J90 Pleural effusion, not elsewhere classified: Secondary | ICD-10-CM | POA: Diagnosis not present

## 2016-05-24 DIAGNOSIS — M4317 Spondylolisthesis, lumbosacral region: Secondary | ICD-10-CM | POA: Diagnosis not present

## 2016-06-08 DIAGNOSIS — I351 Nonrheumatic aortic (valve) insufficiency: Secondary | ICD-10-CM | POA: Diagnosis not present

## 2016-06-08 DIAGNOSIS — I712 Thoracic aortic aneurysm, without rupture: Secondary | ICD-10-CM | POA: Diagnosis not present

## 2016-06-08 DIAGNOSIS — I2510 Atherosclerotic heart disease of native coronary artery without angina pectoris: Secondary | ICD-10-CM | POA: Diagnosis not present

## 2016-06-08 DIAGNOSIS — I25810 Atherosclerosis of coronary artery bypass graft(s) without angina pectoris: Secondary | ICD-10-CM | POA: Diagnosis not present

## 2016-06-11 DIAGNOSIS — N411 Chronic prostatitis: Secondary | ICD-10-CM | POA: Diagnosis not present

## 2016-06-11 DIAGNOSIS — R339 Retention of urine, unspecified: Secondary | ICD-10-CM | POA: Diagnosis not present

## 2016-06-11 DIAGNOSIS — N401 Enlarged prostate with lower urinary tract symptoms: Secondary | ICD-10-CM | POA: Diagnosis not present

## 2016-06-11 DIAGNOSIS — N453 Epididymo-orchitis: Secondary | ICD-10-CM | POA: Diagnosis not present

## 2016-07-26 DIAGNOSIS — D2271 Melanocytic nevi of right lower limb, including hip: Secondary | ICD-10-CM | POA: Diagnosis not present

## 2016-07-26 DIAGNOSIS — D2272 Melanocytic nevi of left lower limb, including hip: Secondary | ICD-10-CM | POA: Diagnosis not present

## 2016-07-26 DIAGNOSIS — D2261 Melanocytic nevi of right upper limb, including shoulder: Secondary | ICD-10-CM | POA: Diagnosis not present

## 2016-07-26 DIAGNOSIS — D225 Melanocytic nevi of trunk: Secondary | ICD-10-CM | POA: Diagnosis not present

## 2016-07-26 DIAGNOSIS — L570 Actinic keratosis: Secondary | ICD-10-CM | POA: Diagnosis not present

## 2016-07-31 DIAGNOSIS — K5120 Ulcerative (chronic) proctitis without complications: Secondary | ICD-10-CM | POA: Diagnosis not present

## 2016-07-31 DIAGNOSIS — M797 Fibromyalgia: Secondary | ICD-10-CM | POA: Diagnosis not present

## 2016-07-31 DIAGNOSIS — I712 Thoracic aortic aneurysm, without rupture: Secondary | ICD-10-CM | POA: Diagnosis not present

## 2016-07-31 DIAGNOSIS — M069 Rheumatoid arthritis, unspecified: Secondary | ICD-10-CM | POA: Diagnosis not present

## 2016-07-31 DIAGNOSIS — R69 Illness, unspecified: Secondary | ICD-10-CM | POA: Diagnosis not present

## 2016-07-31 DIAGNOSIS — Z6828 Body mass index (BMI) 28.0-28.9, adult: Secondary | ICD-10-CM | POA: Diagnosis not present

## 2016-08-20 HISTORY — PX: GREATER OMENTAL FLAP CLOSURE: SHX6319

## 2016-11-14 DIAGNOSIS — Z23 Encounter for immunization: Secondary | ICD-10-CM | POA: Diagnosis not present

## 2016-11-14 DIAGNOSIS — K439 Ventral hernia without obstruction or gangrene: Secondary | ICD-10-CM | POA: Diagnosis not present

## 2016-11-14 DIAGNOSIS — I712 Thoracic aortic aneurysm, without rupture: Secondary | ICD-10-CM | POA: Diagnosis not present

## 2016-11-14 DIAGNOSIS — N400 Enlarged prostate without lower urinary tract symptoms: Secondary | ICD-10-CM | POA: Diagnosis not present

## 2016-11-19 ENCOUNTER — Encounter: Payer: Self-pay | Admitting: Cardiology

## 2016-11-27 ENCOUNTER — Other Ambulatory Visit: Payer: Self-pay | Admitting: *Deleted

## 2016-11-27 ENCOUNTER — Encounter: Payer: Self-pay | Admitting: *Deleted

## 2016-12-13 ENCOUNTER — Ambulatory Visit (HOSPITAL_COMMUNITY): Payer: Medicare HMO | Attending: Cardiovascular Disease

## 2016-12-13 ENCOUNTER — Encounter (INDEPENDENT_AMBULATORY_CARE_PROVIDER_SITE_OTHER): Payer: Self-pay

## 2016-12-13 ENCOUNTER — Other Ambulatory Visit: Payer: Self-pay

## 2016-12-13 ENCOUNTER — Ambulatory Visit (INDEPENDENT_AMBULATORY_CARE_PROVIDER_SITE_OTHER): Payer: Medicare HMO | Admitting: Cardiology

## 2016-12-13 ENCOUNTER — Encounter: Payer: Self-pay | Admitting: Cardiology

## 2016-12-13 VITALS — BP 140/82 | HR 87 | Ht 68.0 in | Wt 173.4 lb

## 2016-12-13 DIAGNOSIS — R42 Dizziness and giddiness: Secondary | ICD-10-CM

## 2016-12-13 DIAGNOSIS — Z953 Presence of xenogenic heart valve: Secondary | ICD-10-CM | POA: Diagnosis not present

## 2016-12-13 DIAGNOSIS — K439 Ventral hernia without obstruction or gangrene: Secondary | ICD-10-CM

## 2016-12-13 DIAGNOSIS — Z8679 Personal history of other diseases of the circulatory system: Secondary | ICD-10-CM

## 2016-12-13 DIAGNOSIS — R531 Weakness: Secondary | ICD-10-CM

## 2016-12-13 DIAGNOSIS — Z9889 Other specified postprocedural states: Secondary | ICD-10-CM

## 2016-12-13 DIAGNOSIS — E7800 Pure hypercholesterolemia, unspecified: Secondary | ICD-10-CM | POA: Diagnosis not present

## 2016-12-13 DIAGNOSIS — I071 Rheumatic tricuspid insufficiency: Secondary | ICD-10-CM | POA: Insufficient documentation

## 2016-12-13 LAB — CBC
HEMOGLOBIN: 15.4 g/dL (ref 13.0–17.7)
Hematocrit: 45.1 % (ref 37.5–51.0)
MCH: 28.4 pg (ref 26.6–33.0)
MCHC: 34.1 g/dL (ref 31.5–35.7)
MCV: 83 fL (ref 79–97)
PLATELETS: 303 10*3/uL (ref 150–379)
RBC: 5.42 x10E6/uL (ref 4.14–5.80)
RDW: 15 % (ref 12.3–15.4)
WBC: 8.8 10*3/uL (ref 3.4–10.8)

## 2016-12-13 LAB — LIPID PANEL
CHOL/HDL RATIO: 4.4 ratio (ref 0.0–5.0)
Cholesterol, Total: 199 mg/dL (ref 100–199)
HDL: 45 mg/dL (ref >39)
LDL CALC: 121 mg/dL — AB (ref 0–99)
Triglycerides: 166 mg/dL — ABNORMAL HIGH (ref 0–149)
VLDL CHOLESTEROL CAL: 33 mg/dL (ref 5–40)

## 2016-12-13 LAB — COMPREHENSIVE METABOLIC PANEL
ALBUMIN: 4.5 g/dL (ref 3.6–4.8)
ALK PHOS: 98 IU/L (ref 39–117)
ALT: 13 IU/L (ref 0–44)
AST: 13 IU/L (ref 0–40)
Albumin/Globulin Ratio: 1.8 (ref 1.2–2.2)
BUN/Creatinine Ratio: 18 (ref 10–24)
BUN: 16 mg/dL (ref 8–27)
Bilirubin Total: 0.8 mg/dL (ref 0.0–1.2)
CO2: 22 mmol/L (ref 18–29)
CREATININE: 0.9 mg/dL (ref 0.76–1.27)
Calcium: 9.7 mg/dL (ref 8.6–10.2)
Chloride: 94 mmol/L — ABNORMAL LOW (ref 96–106)
GFR calc Af Amer: 103 mL/min/{1.73_m2} (ref >59)
GFR calc non Af Amer: 89 mL/min/{1.73_m2} (ref >59)
GLUCOSE: 99 mg/dL (ref 65–99)
Globulin, Total: 2.5 g/dL (ref 1.5–4.5)
Potassium: 5.1 mmol/L (ref 3.5–5.2)
Sodium: 135 mmol/L (ref 134–144)
Total Protein: 7 g/dL (ref 6.0–8.5)

## 2016-12-13 LAB — TSH: TSH: 1.06 u[IU]/mL (ref 0.450–4.500)

## 2016-12-13 LAB — T4, FREE: FREE T4: 1.3 ng/dL (ref 0.82–1.77)

## 2016-12-13 LAB — ECHOCARDIOGRAM COMPLETE
HEIGHTINCHES: 68 in
WEIGHTICAEL: 2774 [oz_av]

## 2016-12-13 NOTE — Patient Instructions (Signed)
Medication Instructions:  Please decrease Metoprolol to 1/2 tablet (12.5 mg) a day. Continue all other medications as listed.  Labwork: Please have blood work today. (CBC, CMP, TSH ,LIPID and FREE T4)  Testing/Procedures: Your physician has requested that you have an echocardiogram. Echocardiography is a painless test that uses sound waves to create images of your heart. It provides your doctor with information about the size and shape of your heart and how well your heart's chambers and valves are working. This procedure takes approximately one hour. There are no restrictions for this procedure.  Follow-Up: Follow up in 3 months with Dr Marlou Porch.  You have been referred to [facility] for evaluation of hernia.  Thank you for choosing [facility]!!

## 2016-12-13 NOTE — Progress Notes (Addendum)
Cardiology Office Note:    Date:  12/18/2016   ID:  Erik Salazar, DOB 1950/10/12, MRN [id number]  PCP:  [facility]  Cardiologist:  Candee Furbish, MD    Referring MD: Judge Stall, FNP     History of Present Illness:    Erik Salazar is a 66 y.o. male with a hx of Aortic aneurysm repair here to establish care. In May 2017 he was found to have an ascending aortic aneurysm and had repair in [place]. He stated that they "repaired 2 valves "at the same time. He had a small hernia at the base of his incision that was being monitored. He was told to wait a full year before having this fixed by a Psychologist, sport and exercise. Sometimes he is bothered by but not much.  One-year prior to his surgery he had a documented 4 cm enlargement of his ascending aorta. He then underwent a another CAT scan of his chest which showed an aneurysm as large as 5.3 cm. There was no evidence of dissection no hematoma. He cardiac catheterization that showed no significant coronary artery disease. The aneurysm was located in the ascending aorta just above the sinotubular ridge, saccular.  He underwent placement of his ascending aorta and aortic valve. Bentall procedure. Bioprosthetic aortic valve #23 mm bovine model #2700 TF ask, and 28 mm Gelweave woven vascular sinus of Valsalva graft. He had reimplantation of the left and right coronary ostia. Coronary artery bypass grafting 2 also utilizing the SVG from aorta to LAD and circumflex artery were placed. Left leg vein. This was performed on 01/04/16 by Dr. Matthias Hughs at [facility] in [place].  He's had 14 total knee surgery since 1983 and a knee replacement in 2015. He had a DVT post knee surgery.  He's never smoked. Walks up to 1-2 miles a day. Stuggles afterwards. Sometimes walking into the wind hard to breath. Was very active before this.   He has been compliant with his medications.  Has esophageal dil x 3. 150 sit ups prior.  Retired Control and instrumentation engineer in jail.   Feels weak, lightheaded, dizzy, weak. Sometimes twinges.   BP at home 115-120. Once at [facility] 105.   Past Medical History:  Diagnosis Date  . Enlarged prostate   . H/O: knee surgery    15   . History of open heart surgery    ASCENDING AORTIC ANEURYSM  . Status post knee surgery    DVT POST KNEE SURGERY    Past Surgical History:  Procedure Laterality Date  . CARDIAC SURGERY     ANUERSYM MAY 2017  . KNEE SURGERY  1983  . REPLACEMENT TOTAL KNEE  2015    Current Medications: Current Meds  Medication Sig  . aspirin 325 MG tablet Take 325 mg by mouth daily.  . finasteride (PROSCAR) 5 MG tablet Take 5 mg by mouth daily.  . metoprolol succinate (TOPROL-XL) 25 MG 24 hr tablet Take 12.5 mg by mouth daily.  . pravastatin (PRAVACHOL) 20 MG tablet Take 20 mg by mouth daily.  . tamsulosin (FLOMAX) 0.4 MG CAPS capsule Take 0.4 mg by mouth.     Allergies:   Oxycodone hcl; Oxycontin [oxycodone]; Percocet [oxycodone-acetaminophen]; and Sulfur   Social History   Social History  . Marital status: Married    Spouse name: N/A  . Number of children: N/A  . Years of education: N/A   Occupational History  . RETIRED    Social History Main Topics  .  Smoking status: Never Smoker  . Smokeless tobacco: Never Used  . Alcohol use Yes  . Drug use: No  . Sexual activity: Yes   Other Topics Concern  . None   Social History Narrative  . None     family history includes Aneurysm in his mother. Other history of brain aneurysms on his side of the family  ROS:   Please see the history of present illness.     All other systems reviewed and are negative.   EKGs/Labs/Other Studies Reviewed:    EKG:  EKG is  ordered today.  The ekg ordered today demonstrates 12/13/16-sinus rhythm 87 with incomplete right bundle branch block personally viewed.  Recent Labs: 12/13/2016: ALT 13; BUN 16; Creatinine, Ser 0.90; Platelets 303; Potassium 5.1; Sodium 135; TSH 1.060     Recent Lipid Panel    Component Value Date/Time   CHOL 199 12/13/2016 0942   TRIG 166 (H) 12/13/2016 0942   HDL 45 12/13/2016 0942   CHOLHDL 4.4 12/13/2016 0942   LDLCALC 121 (H) 12/13/2016 0942    Physical Exam:    VS:  BP 140/82   Pulse 87   Ht 5\' 8"  (1.727 m)   Wt 173 lb 6 oz (78.6 kg)   SpO2 97%   BMI 26.36 kg/m     Wt Readings from Last 3 Encounters:  12/13/16 173 lb 6 oz (78.6 kg)     GEN:  Well nourished, well developed in no acute distress HEENT: Normal NECK: No JVD; No carotid bruits LYMPHATICS: No lymphadenopathy CARDIAC: RRR, no murmurs, rubs, gallops, incisional hernia, ventral at base of scar approximately 7 cm RESPIRATORY:  Clear to auscultation without rales, wheezing or rhonchi  ABDOMEN: Soft, non-tender, non-distended MUSCULOSKELETAL:  No edema; No deformity  SKIN: Warm and dry NEUROLOGIC:  Alert and oriented x 3 PSYCHIATRIC:  Normal affect   ASSESSMENT:    1. Weakness   2. Dizziness   3. H/O aortic valve repair   4. Hypercholesteremia   5. Ventral hernia without obstruction or gangrene   6. History of aortic root repair    PLAN:    In order of problems listed above:  Ascending aortic aneurysm repair status post Bentall procedure and bioprosthetic aortic valve replacement  - We will check echocardiogram  - Dental prophylaxis  - Aspirin 325  Dizziness/hypotension  - In the past he was unable to tolerate 25 mg of Toprol-XL at one time. We will cut back his Toprol from 12.5 mg twice a day to 12.5 mg in the evening.  - Continue to encourage hydration.  Hyperlipidemia  - Continue with pravastatin. We will check lipid panel  Weakness  - Checking CBC, TSH and free T4.  - Discussed postop fatigue at length. Encouraging that he is able to walk 1-2 miles a day. 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  Incisional  hernia  -We will have him see cardiothoracic surgery for their opinion. He states that it is bothersome to him especially when he coughs.    Medication Adjustments/Labs and Tests Ordered: Current medicines are reviewed at length with the patient today.  Concerns regarding medicines are outlined above. Labs and tests ordered and medication changes are outlined in the patient instructions below:  Patient Instructions  Medication Instructions:  Please decrease Metoprolol to 1/2 tablet (12.5 mg) a day. Continue  all other medications as listed.  Labwork: Please have blood work today. (CBC, CMP, TSH ,LIPID and FREE T4)  Testing/Procedures: Your physician has requested that you have an echocardiogram. Echocardiography is a painless test that uses sound waves to create images of your heart. It provides your doctor with information about the size and shape of your heart and how well your heart's chambers and valves are working. This procedure takes approximately one hour. There are no restrictions for this procedure.  Follow-Up: Follow up in 3 months with Dr Marlou Porch.  You have been referred to [facility] for evaluation of hernia.  Thank you for choosing [facility]!!        Signed, Candee Furbish, MD  12/18/2016 6:05 AM    [place]

## 2016-12-18 ENCOUNTER — Telehealth: Payer: Self-pay | Admitting: Cardiology

## 2016-12-18 DIAGNOSIS — E7800 Pure hypercholesterolemia, unspecified: Secondary | ICD-10-CM

## 2016-12-18 DIAGNOSIS — Z79899 Other long term (current) drug therapy: Secondary | ICD-10-CM

## 2016-12-18 NOTE — Telephone Encounter (Signed)
New message    Pt is returning call about lab results and echo.

## 2016-12-19 DIAGNOSIS — L989 Disorder of the skin and subcutaneous tissue, unspecified: Secondary | ICD-10-CM | POA: Diagnosis not present

## 2016-12-19 DIAGNOSIS — E782 Mixed hyperlipidemia: Secondary | ICD-10-CM | POA: Diagnosis not present

## 2016-12-19 NOTE — Telephone Encounter (Signed)
Reviewed results of lab work and echocardiogram.  Pt has only been taking Pravastatin 3 times a week and wants to try increasing it to daily to see how that does before trying a different medication.  He reports he has been taking Pravastatin like this for some time and has never had a problem with the results.  He is aware to recheck in 3 months (around 03/21/17)

## 2017-01-15 ENCOUNTER — Other Ambulatory Visit: Payer: Self-pay | Admitting: *Deleted

## 2017-01-15 ENCOUNTER — Encounter: Payer: Medicare HMO | Admitting: Cardiothoracic Surgery

## 2017-01-15 DIAGNOSIS — Z9889 Other specified postprocedural states: Principal | ICD-10-CM

## 2017-01-15 DIAGNOSIS — Z8679 Personal history of other diseases of the circulatory system: Secondary | ICD-10-CM

## 2017-01-23 ENCOUNTER — Ambulatory Visit
Admission: RE | Admit: 2017-01-23 | Discharge: 2017-01-23 | Disposition: A | Discharge status: Home / Self Care | Payer: Medicare HMO | Source: Ambulatory Visit | Attending: Cardiothoracic Surgery | Admitting: Cardiothoracic Surgery

## 2017-01-23 ENCOUNTER — Institutional Professional Consult (permissible substitution) (INDEPENDENT_AMBULATORY_CARE_PROVIDER_SITE_OTHER): Payer: Medicare HMO | Admitting: Cardiothoracic Surgery

## 2017-01-23 ENCOUNTER — Encounter: Payer: Self-pay | Admitting: Cardiothoracic Surgery

## 2017-01-23 VITALS — BP 128/80 | HR 92 | Resp 20 | Ht 68.0 in | Wt 165.0 lb

## 2017-01-23 DIAGNOSIS — K432 Incisional hernia without obstruction or gangrene: Secondary | ICD-10-CM | POA: Diagnosis not present

## 2017-01-23 DIAGNOSIS — Z8679 Personal history of other diseases of the circulatory system: Secondary | ICD-10-CM | POA: Diagnosis not present

## 2017-01-23 DIAGNOSIS — Z9889 Other specified postprocedural states: Principal | ICD-10-CM

## 2017-01-23 DIAGNOSIS — I712 Thoracic aortic aneurysm, without rupture: Secondary | ICD-10-CM | POA: Diagnosis not present

## 2017-01-23 MED ORDER — IOPAMIDOL (ISOVUE-370) INJECTION 76%
75.0000 mL | Freq: Once | INTRAVENOUS | Status: AC | PRN
  Administered 2017-01-23: 75 mL via INTRAVENOUS

## 2017-01-24 NOTE — Progress Notes (Signed)
PCP is Pa, [place] Referring Provider is Jerline Pain, MD  Chief Complaint  Patient presents with  . Incisional Hernia    Surgical eval with CTA Chest today, ECHO 12/13/16, HX of Bentall procedure 12/2015 in [place]  Patient examined, op note from biologic Bentall procedure performed [place] May 2017 reviewed, CTA of the thoracic aorta currently performed reviewed and counseled with patient and wife  HPI: Very nice 66 year old male nonsmoker referred by his cardiologist for follow-up of a biologic Bentall procedure [place] fusiform [place]. The patient's cardiologist performed an echocardiogram showing good LV function, normal function of the bioprosthetic aortic valve. Aortic root appeared normal. The patient had developed a epigastric hernia, 2 finger widths diameter mildly symptomatic and was interested in surgical repair. The patient has done well since the [facility] operation without chest pain. He still has not completely recovered and has mild dyspnea on exertion. He also complains of problems with short-term memory which have persisted after surgery.  Review of the op note demonstrates the procedure was done without circulatory arrest using a cross-clamp below the innominate artery. The patient had poor LV function upon separation from cardio pulmonary bypass and vein grafts were placed to the LAD and circumflex with improvement of LV function. Patient states she was discharged in approximately 6 days after surgery. Patient states he was never on any blood thinners stronger than aspirin.  On exam patient does have a small easily reducible epigastric hernia. 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 The pseudoaneurysm does not demonstrate contrast well and appears chronic and a low pressure structure. There is some  external compression of the SVC and right atrium by the false aneurysm. The patient was not aware of this postoperative problem. He states he had not had a CT scan postop by the cardiac surgeon in [place]  Past Medical History:  Diagnosis Date  . Enlarged prostate   . H/O: knee surgery    15   . History of open heart surgery    ASCENDING AORTIC ANEURYSM  . Status post knee surgery    DVT POST KNEE SURGERY    Past Surgical History:  Procedure Laterality Date  . CARDIAC SURGERY     ANUERSYM MAY 2017  . KNEE SURGERY  1983  . REPLACEMENT TOTAL KNEE  2015    Family History  Problem Relation Age of Onset  . Aneurysm Mother        brain aneurysm for mother.     Social History Social History  Substance Use Topics  . Smoking status: Never Smoker  . Smokeless tobacco: Never Used  . Alcohol use Yes    Current Outpatient Prescriptions  Medication Sig Dispense Refill  . aspirin 325 MG tablet Take 325 mg by mouth daily.    . finasteride (PROSCAR) 5 MG tablet Take 5 mg by mouth daily.    . metoprolol succinate (TOPROL-XL) 25 MG 24 hr tablet Take 12.5 mg by mouth daily.    . pravastatin (PRAVACHOL) 20 MG tablet Take 20 mg by mouth daily.    . tamsulosin (FLOMAX) 0.4 MG CAPS capsule Take 0.4 mg by mouth.     No current facility-administered medications for this visit.     Allergies  Allergen Reactions  . Oxycodone Hcl     DELUSIONAL  .  Oxycontin [Oxycodone]     DELUSIONAL   . Percocet [Oxycodone-Acetaminophen]     DELUSIONAL  . Sulfur Rash    Review of Systems         Review of Systems :  [ y ] = yes, [  ] = no        General :  Weight gain [   ]    Weight loss  [   ]  Fatigue [ y ]  Fever [  ]  Chills  [  ]                                Weakness  [  ]           HEENT    Headache [  ]  Dizziness [  ]  Blurred vision [  ] Glaucoma  [  ]                          Nosebleeds [  ] Painful or loose teeth [  ]        Cardiac :  Chest pain/ pressure [  ]   Resting SOB [  ] exertional SOB [  ]                        Orthopnea [  ]  Pedal edema  [  ]  Palpitations [  ] Syncope/presyncope [ ]                         Paroxysmal nocturnal dyspnea [  ]         Pulmonary : cough [  ]  wheezing [  ]  Hemoptysis [  ] Sputum [  ] Snoring [  ]                              Pneumothorax [  ]  Sleep apnea [  ]        GI : Vomiting [  ]  Dysphagia [  ]  Melena  [  ]  Abdominal pain [y-hernia site  ] BRBPR [  ]              Heart burn [  ]  Constipation [  ] Diarrhea  [  ] Colonoscopy [   ]        GU : Hematuria [  ]  Dysuria [  ]  Nocturia [yes bph  ] UTI's [  ]        Vascular : Claudication [  ]  Rest pain [  ]  DVT [  ] Vein stripping [  ] leg ulcers [  ]                          TIA [  ] Stroke [  ]  Varicose veins Blue.Reese  ]        NEURO :  Headaches  [  ] Seizures [  ] Vision changes [  ] Paresthesias [  ]  Seizures [  ]        Musculoskeletal :  Arthritis [  ] Gout  [  ]  Back pain [  ]  Joint pain [  ]        Skin :  Rash [  ]  Melanoma [  ] Sores [  ]        Heme : Bleeding problems [  ]Clotting Disorders [  ] Anemia [  ]Blood Transfusion [ ]         Endocrine : Diabetes [  ] Heat or Cold intolerance [  ] Polyuria [  ]excessive thirst [ ]         Psych : Depression [  ]  Anxiety [  ]  Psych hospitalizations [  ] Memory change [y short-term memory problems after heart surgery  ]                                               BP 128/80   Pulse 92   Resp 20   Ht 5\' 8"  (1.727 m)   Wt 165 lb (74.8 kg)   SpO2 98% Comment: RA  BMI 25.09 kg/m  Physical Exam     Physical Exam  General: Alert comfortable middle-aged male no acute distress HEENT: Normocephalic pupils equal , dentition adequate Neck: Supple without JVD, adenopathy, or bruit Chest: Clear to auscultation, symmetrical breath sounds, no rhonchi, no tenderness             or deformity. Well-healed sternal incision Cardiovascular: Regular rate and  rhythm, no murmur, no gallop, peripheral pulses             palpable in all extremities Abdomen:  Soft, nontender, no palpable mass or organomegaly. Small 2 finger breaths epigastric hernia easily reducible nontender Extremities: Warm, well-perfused, no clubbing cyanosis edema or tenderness,              Moderate bilateral venous stasis changes of the legs Rectal/GU: Deferred Neuro: Grossly non--focal and symmetrical throughout Skin: Clean and dry without rash or ulceration   Diagnostic Tests: CTA scan shows the false aneurysm from a probable small leak at the distal anastomosis of the graft to the aorta. There is some hypodense streaking at the area of the graft to the aorta as well which could be artifact or a problem with the anastomosis. There is no pleural effusion. Lungs are clear. The arch and arch vessels are intact. There is no dissection.  Impression: I discussed the problem with the false aneurysm with the patient and his wife and have  recommended surgical repair. Since they  had no knowledge of this problem and presented only for evaluation of his abdominal wall hernia they were very anxious and wanted to think about what to do next. I strongly recommended surgical repair as the only good option. The patient wishes to have a second opinion of the scan  from his original surgeon, Dr. Dannette Barbara in [place]. We will send the disc to Dr. Alver Fisher with a letter about the patient and then meet back with the patient to discuss the next step which, which I have recommended as surgical repair through redo sternotomy. Since the patient is in the process of moving to this area and will need follow-up of his aorta in the future it would probably better for him to have  the surgery here in [place].  Plan: The diagnosis of false aneurysm and recommendation for surgery has been discussed with the patient. They wish to have a second opinion by his original surgery before proceeding with  surgery. The patient will return in 2 weeks to review the situation and we will send a copy of the CT scan to the patients surgeon Dr. Dannette Barbara in [place].. The patient understands that he should not do any heavy lifting or exertional activities, he should report any sudden change in breathing difficulties or chest pain immediately.  Len Childs, MD [facility] [phone]

## 2017-02-05 ENCOUNTER — Encounter: Payer: Self-pay | Admitting: Cardiothoracic Surgery

## 2017-02-05 ENCOUNTER — Ambulatory Visit (INDEPENDENT_AMBULATORY_CARE_PROVIDER_SITE_OTHER): Payer: Medicare HMO | Admitting: Cardiothoracic Surgery

## 2017-02-05 VITALS — BP 120/75 | HR 99 | Resp 16 | Ht 68.0 in | Wt 165.0 lb

## 2017-02-05 DIAGNOSIS — Z9889 Other specified postprocedural states: Secondary | ICD-10-CM | POA: Diagnosis not present

## 2017-02-05 DIAGNOSIS — K432 Incisional hernia without obstruction or gangrene: Secondary | ICD-10-CM

## 2017-02-05 DIAGNOSIS — Z8679 Personal history of other diseases of the circulatory system: Secondary | ICD-10-CM

## 2017-02-05 NOTE — Progress Notes (Signed)
PCP is Pa, [place] Referring Provider is Jerline Pain, MD  Chief Complaint  Patient presents with  . Follow-up    2 wk to discuss possible surgery    HPI: Patient returns to discuss options for his asymptomatic false aneurysm at the distal suture line of a biologic-Bentall root replacement performed in [place] last year. This was performed by Dr. Alver Fisher. The patient presented with a route aneurysm, aortic insufficiency with normal coronaries. He had a biologic Bentall root replacement with additional 2 vein grafts placed to the LAD and circumflex because of problems with the left main coronary button. The patient did well and relocated to this area. He is seen by cardiologist, Dr. Marlou Porch who performed a echocardiogram showing normal LV function, normal aortic valve function, normal-appearing aortic root. I saw the patient because of a mild-moderate ventral hernia and had a CTA performed which showed the false aneurysm at the distal suture line. The procedure was performed under cross-clamp without circulatory arrest and a Teflon felt reinforced distal anastomosis. The leak appears to be small and the false aneurysm is probably mainly thrombus with some compression of the SVC. The patient denies any upper extremity swelling or chest pressure. No orthopnea or PND. He does have some dyspnea on exertion. He does have some short-term memory problems following surgery.  I recommended surgical repair of the false aneurysm. The patient wished to have a second opinion by his surgeon, Dr. Elwanda [place] sent a copy of the CTA disc with my evaluation to Dr. Alver Fisher for a second opinion. I have not heard back from Dr. Alver Fisher.  The patient is wife still are not sure they want to proceed with a second sternotomy and aortic repair. They wish to be referred for a second opinion here locally at [facility]. We'll send the patient's records and disc of his CTA to Dr.  Ysidro Evert at [facility] center. I am uncertain whether a TEVAR  approach would possible for this patient because of the close proximity of the distal suture line to the origin of the innominate artery. The patient understands he should do no exertional activities or heavy lifting because of risk of expansion of the pseudoaneurysm.  Past Medical History:  Diagnosis Date  . Enlarged prostate   . H/O: knee surgery    15   . History of open heart surgery    ASCENDING AORTIC ANEURYSM  . Status post knee surgery    DVT POST KNEE SURGERY    Past Surgical History:  Procedure Laterality Date  . CARDIAC SURGERY     ANUERSYM MAY 2017  . KNEE SURGERY  1983  . REPLACEMENT TOTAL KNEE  2015    Family History  Problem Relation Age of Onset  . Aneurysm Mother        brain aneurysm for mother.     Social History Social History  Substance Use Topics  . Smoking status: Never Smoker  . Smokeless tobacco: Never Used  . Alcohol use Yes    Current Outpatient Prescriptions  Medication Sig Dispense Refill  . aspirin 325 MG tablet Take 325 mg by mouth daily.    . finasteride (PROSCAR) 5 MG tablet Take 5 mg by mouth daily.    . metoprolol succinate (TOPROL-XL) 25 MG 24 hr tablet Take 12.5 mg by mouth daily.    . pravastatin (PRAVACHOL) 20 MG tablet Take 20 mg by mouth daily.    . tamsulosin (FLOMAX) 0.4 MG CAPS capsule  Take 0.4 mg by mouth.     No current facility-administered medications for this visit.     Allergies  Allergen Reactions  . Oxycodone Hcl     DELUSIONAL  . Oxycontin [Oxycodone]     DELUSIONAL   . Percocet [Oxycodone-Acetaminophen]     DELUSIONAL  . Sulfur Rash    Review of Systems  No change since his last office visit 2 weeks ago He is fairly sedentary at this point and comfortable. BP 120/75 (BP Location: Right Arm, Patient Position: Sitting, Cuff Size: Large)   Pulse 99   Resp 16   Ht 5\' 8"  (1.727 m)   Wt 165 lb (74.8 kg)   SpO2 95% Comment: ON RA  BMI 25.09  kg/m  Physical Exam      Exam    General- alert and comfortable. 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 Ysidro Evert at [facility] for evaluation and therapy.   Len Childs, MD [facility] [phone]

## 2017-02-12 DIAGNOSIS — L570 Actinic keratosis: Secondary | ICD-10-CM | POA: Diagnosis not present

## 2017-02-12 DIAGNOSIS — D485 Neoplasm of uncertain behavior of skin: Secondary | ICD-10-CM | POA: Diagnosis not present

## 2017-02-12 DIAGNOSIS — L821 Other seborrheic keratosis: Secondary | ICD-10-CM | POA: Diagnosis not present

## 2017-02-12 DIAGNOSIS — C4442 Squamous cell carcinoma of skin of scalp and neck: Secondary | ICD-10-CM | POA: Diagnosis not present

## 2017-02-12 DIAGNOSIS — D225 Melanocytic nevi of trunk: Secondary | ICD-10-CM | POA: Diagnosis not present

## 2017-02-26 DIAGNOSIS — I10 Essential (primary) hypertension: Secondary | ICD-10-CM | POA: Insufficient documentation

## 2017-02-26 DIAGNOSIS — E782 Mixed hyperlipidemia: Secondary | ICD-10-CM | POA: Insufficient documentation

## 2017-02-27 DIAGNOSIS — I719 Aortic aneurysm of unspecified site, without rupture: Secondary | ICD-10-CM | POA: Diagnosis not present

## 2017-02-27 DIAGNOSIS — Z9889 Other specified postprocedural states: Secondary | ICD-10-CM | POA: Diagnosis not present

## 2017-02-27 DIAGNOSIS — Z8679 Personal history of other diseases of the circulatory system: Secondary | ICD-10-CM | POA: Diagnosis not present

## 2017-03-07 DIAGNOSIS — L905 Scar conditions and fibrosis of skin: Secondary | ICD-10-CM | POA: Diagnosis not present

## 2017-03-07 DIAGNOSIS — C4442 Squamous cell carcinoma of skin of scalp and neck: Secondary | ICD-10-CM | POA: Diagnosis not present

## 2017-03-12 DIAGNOSIS — I25798 Atherosclerosis of other coronary artery bypass graft(s) with other forms of angina pectoris: Secondary | ICD-10-CM | POA: Diagnosis not present

## 2017-03-12 DIAGNOSIS — I719 Aortic aneurysm of unspecified site, without rupture: Secondary | ICD-10-CM | POA: Diagnosis not present

## 2017-03-12 DIAGNOSIS — I25118 Atherosclerotic heart disease of native coronary artery with other forms of angina pectoris: Secondary | ICD-10-CM | POA: Diagnosis not present

## 2017-03-12 DIAGNOSIS — Y838 Other surgical procedures as the cause of abnormal reaction of the patient, or of later complication, without mention of misadventure at the time of the procedure: Secondary | ICD-10-CM | POA: Diagnosis not present

## 2017-03-12 DIAGNOSIS — Z952 Presence of prosthetic heart valve: Secondary | ICD-10-CM | POA: Diagnosis not present

## 2017-03-12 DIAGNOSIS — T82858A Stenosis of vascular prosthetic devices, implants and grafts, initial encounter: Secondary | ICD-10-CM | POA: Insufficient documentation

## 2017-03-12 DIAGNOSIS — I77819 Aortic ectasia, unspecified site: Secondary | ICD-10-CM | POA: Diagnosis not present

## 2017-03-12 DIAGNOSIS — T8209XA Other mechanical complication of heart valve prosthesis, initial encounter: Secondary | ICD-10-CM | POA: Diagnosis not present

## 2017-03-12 DIAGNOSIS — Z9889 Other specified postprocedural states: Secondary | ICD-10-CM | POA: Diagnosis not present

## 2017-03-12 DIAGNOSIS — Z8679 Personal history of other diseases of the circulatory system: Secondary | ICD-10-CM | POA: Diagnosis not present

## 2017-03-12 DIAGNOSIS — Z951 Presence of aortocoronary bypass graft: Secondary | ICD-10-CM | POA: Diagnosis not present

## 2017-03-13 DIAGNOSIS — R569 Unspecified convulsions: Secondary | ICD-10-CM | POA: Diagnosis not present

## 2017-03-13 DIAGNOSIS — G4089 Other seizures: Secondary | ICD-10-CM | POA: Diagnosis not present

## 2017-03-13 DIAGNOSIS — I10 Essential (primary) hypertension: Secondary | ICD-10-CM | POA: Diagnosis not present

## 2017-03-13 DIAGNOSIS — R1312 Dysphagia, oropharyngeal phase: Secondary | ICD-10-CM | POA: Diagnosis not present

## 2017-03-13 DIAGNOSIS — T792XXA Traumatic secondary and recurrent hemorrhage and seroma, initial encounter: Secondary | ICD-10-CM | POA: Diagnosis not present

## 2017-03-13 DIAGNOSIS — Z9889 Other specified postprocedural states: Secondary | ICD-10-CM | POA: Diagnosis not present

## 2017-03-13 DIAGNOSIS — J90 Pleural effusion, not elsewhere classified: Secondary | ICD-10-CM | POA: Diagnosis not present

## 2017-03-13 DIAGNOSIS — T827XXA Infection and inflammatory reaction due to other cardiac and vascular devices, implants and grafts, initial encounter: Secondary | ICD-10-CM | POA: Diagnosis not present

## 2017-03-13 DIAGNOSIS — L02213 Cutaneous abscess of chest wall: Secondary | ICD-10-CM | POA: Diagnosis not present

## 2017-03-13 DIAGNOSIS — J811 Chronic pulmonary edema: Secondary | ICD-10-CM | POA: Diagnosis not present

## 2017-03-13 DIAGNOSIS — J948 Other specified pleural conditions: Secondary | ICD-10-CM | POA: Diagnosis not present

## 2017-03-13 DIAGNOSIS — I6349 Cerebral infarction due to embolism of other cerebral artery: Secondary | ICD-10-CM | POA: Diagnosis not present

## 2017-03-13 DIAGNOSIS — J9383 Other pneumothorax: Secondary | ICD-10-CM | POA: Diagnosis not present

## 2017-03-13 DIAGNOSIS — I712 Thoracic aortic aneurysm, without rupture: Secondary | ICD-10-CM | POA: Diagnosis not present

## 2017-03-13 DIAGNOSIS — J9811 Atelectasis: Secondary | ICD-10-CM | POA: Diagnosis not present

## 2017-03-13 DIAGNOSIS — J939 Pneumothorax, unspecified: Secondary | ICD-10-CM | POA: Diagnosis not present

## 2017-03-13 DIAGNOSIS — I700 Atherosclerosis of aorta: Secondary | ICD-10-CM | POA: Diagnosis not present

## 2017-03-13 DIAGNOSIS — E782 Mixed hyperlipidemia: Secondary | ICD-10-CM | POA: Diagnosis not present

## 2017-03-13 DIAGNOSIS — D62 Acute posthemorrhagic anemia: Secondary | ICD-10-CM | POA: Diagnosis not present

## 2017-03-13 DIAGNOSIS — Y832 Surgical operation with anastomosis, bypass or graft as the cause of abnormal reaction of the patient, or of later complication, without mention of misadventure at the time of the procedure: Secondary | ICD-10-CM | POA: Diagnosis not present

## 2017-03-13 DIAGNOSIS — I639 Cerebral infarction, unspecified: Secondary | ICD-10-CM | POA: Diagnosis not present

## 2017-03-13 DIAGNOSIS — I25810 Atherosclerosis of coronary artery bypass graft(s) without angina pectoris: Secondary | ICD-10-CM | POA: Diagnosis not present

## 2017-03-13 DIAGNOSIS — I240 Acute coronary thrombosis not resulting in myocardial infarction: Secondary | ICD-10-CM | POA: Diagnosis not present

## 2017-03-13 DIAGNOSIS — R918 Other nonspecific abnormal finding of lung field: Secondary | ICD-10-CM | POA: Diagnosis not present

## 2017-03-13 DIAGNOSIS — I672 Cerebral atherosclerosis: Secondary | ICD-10-CM | POA: Diagnosis not present

## 2017-03-13 DIAGNOSIS — I719 Aortic aneurysm of unspecified site, without rupture: Secondary | ICD-10-CM | POA: Diagnosis not present

## 2017-03-13 DIAGNOSIS — Z951 Presence of aortocoronary bypass graft: Secondary | ICD-10-CM | POA: Diagnosis not present

## 2017-03-13 DIAGNOSIS — Z953 Presence of xenogenic heart valve: Secondary | ICD-10-CM | POA: Diagnosis not present

## 2017-03-13 DIAGNOSIS — G8194 Hemiplegia, unspecified affecting left nondominant side: Secondary | ICD-10-CM | POA: Diagnosis not present

## 2017-03-13 DIAGNOSIS — Z8679 Personal history of other diseases of the circulatory system: Secondary | ICD-10-CM | POA: Diagnosis not present

## 2017-03-13 DIAGNOSIS — T82898A Other specified complication of vascular prosthetic devices, implants and grafts, initial encounter: Secondary | ICD-10-CM | POA: Diagnosis not present

## 2017-03-13 DIAGNOSIS — I97648 Postprocedural seroma of a circulatory system organ or structure following other circulatory system procedure: Secondary | ICD-10-CM | POA: Diagnosis not present

## 2017-03-13 DIAGNOSIS — I2510 Atherosclerotic heart disease of native coronary artery without angina pectoris: Secondary | ICD-10-CM | POA: Diagnosis not present

## 2017-03-13 HISTORY — PX: CORONARY ARTERY BYPASS GRAFT: SHX141

## 2017-03-13 HISTORY — PX: FALSE ANEURYSM REPAIR: SHX5152

## 2017-03-14 DIAGNOSIS — I639 Cerebral infarction, unspecified: Secondary | ICD-10-CM

## 2017-03-14 DIAGNOSIS — R569 Unspecified convulsions: Secondary | ICD-10-CM | POA: Insufficient documentation

## 2017-03-14 DIAGNOSIS — G40909 Epilepsy, unspecified, not intractable, without status epilepticus: Secondary | ICD-10-CM

## 2017-03-14 HISTORY — DX: Epilepsy, unspecified, not intractable, without status epilepticus: G40.909

## 2017-03-14 HISTORY — DX: Cerebral infarction, unspecified: I63.9

## 2017-03-18 ENCOUNTER — Ambulatory Visit: Payer: Medicare HMO | Admitting: Cardiology

## 2017-03-20 ENCOUNTER — Encounter: Payer: Medicare HMO | Admitting: Cardiothoracic Surgery

## 2017-03-21 ENCOUNTER — Other Ambulatory Visit: Payer: Medicare HMO

## 2017-03-27 DIAGNOSIS — Z9889 Other specified postprocedural states: Secondary | ICD-10-CM

## 2017-03-28 ENCOUNTER — Encounter (HOSPITAL_COMMUNITY): Payer: Self-pay | Admitting: *Deleted

## 2017-03-28 ENCOUNTER — Emergency Department (HOSPITAL_COMMUNITY): Payer: Medicare HMO

## 2017-03-28 ENCOUNTER — Emergency Department (HOSPITAL_BASED_OUTPATIENT_CLINIC_OR_DEPARTMENT_OTHER): Admit: 2017-03-28 | Discharge: 2017-03-28 | Disposition: A | Discharge status: Home / Self Care | Payer: Medicare HMO

## 2017-03-28 ENCOUNTER — Emergency Department (HOSPITAL_COMMUNITY)
Admission: EM | Admit: 2017-03-28 | Discharge: 2017-03-28 | Disposition: A | Discharge status: Home / Self Care | Payer: Medicare HMO | Attending: Emergency Medicine | Admitting: Emergency Medicine

## 2017-03-28 DIAGNOSIS — Z9889 Other specified postprocedural states: Secondary | ICD-10-CM

## 2017-03-28 DIAGNOSIS — M79609 Pain in unspecified limb: Secondary | ICD-10-CM

## 2017-03-28 DIAGNOSIS — M79631 Pain in right forearm: Secondary | ICD-10-CM

## 2017-03-28 DIAGNOSIS — M79601 Pain in right arm: Secondary | ICD-10-CM | POA: Diagnosis not present

## 2017-03-28 DIAGNOSIS — Z7982 Long term (current) use of aspirin: Secondary | ICD-10-CM | POA: Insufficient documentation

## 2017-03-28 DIAGNOSIS — Z79899 Other long term (current) drug therapy: Secondary | ICD-10-CM | POA: Insufficient documentation

## 2017-03-28 DIAGNOSIS — R52 Pain, unspecified: Secondary | ICD-10-CM | POA: Diagnosis not present

## 2017-03-28 DIAGNOSIS — R200 Anesthesia of skin: Secondary | ICD-10-CM | POA: Insufficient documentation

## 2017-03-28 DIAGNOSIS — R202 Paresthesia of skin: Secondary | ICD-10-CM | POA: Diagnosis not present

## 2017-03-28 DIAGNOSIS — J90 Pleural effusion, not elsewhere classified: Secondary | ICD-10-CM | POA: Diagnosis not present

## 2017-03-28 LAB — BASIC METABOLIC PANEL
ANION GAP: 10 (ref 5–15)
BUN: 18 mg/dL (ref 6–20)
CO2: 22 mmol/L (ref 22–32)
Calcium: 8.9 mg/dL (ref 8.9–10.3)
Chloride: 100 mmol/L — ABNORMAL LOW (ref 101–111)
Creatinine, Ser: 0.99 mg/dL (ref 0.61–1.24)
GFR calc Af Amer: >60 mL/min (ref >60)
GFR calc non Af Amer: >60 mL/min (ref >60)
GLUCOSE: 102 mg/dL — AB (ref 65–99)
POTASSIUM: 4.4 mmol/L (ref 3.5–5.1)
Sodium: 132 mmol/L — ABNORMAL LOW (ref 135–145)

## 2017-03-28 LAB — CBC
HEMATOCRIT: 36.4 % — AB (ref 39.0–52.0)
HEMOGLOBIN: 11.9 g/dL — AB (ref 13.0–17.0)
MCH: 28.7 pg (ref 26.0–34.0)
MCHC: 32.7 g/dL (ref 30.0–36.0)
MCV: 87.7 fL (ref 78.0–100.0)
Platelets: 418 10*3/uL — ABNORMAL HIGH (ref 150–400)
RBC: 4.15 MIL/uL — ABNORMAL LOW (ref 4.22–5.81)
RDW: 13.8 % (ref 11.5–15.5)
WBC: 11.3 10*3/uL — ABNORMAL HIGH (ref 4.0–10.5)

## 2017-03-28 LAB — I-STAT TROPONIN, ED: Troponin i, poc: 0 ng/mL (ref 0.00–0.08)

## 2017-03-28 MED ORDER — IOPAMIDOL (ISOVUE-370) INJECTION 76%
50.0000 mL | Freq: Once | INTRAVENOUS | Status: AC | PRN
  Administered 2017-03-28: 50 mL via INTRAVENOUS

## 2017-03-28 MED ORDER — IOPAMIDOL (ISOVUE-370) INJECTION 76%
INTRAVENOUS | Status: AC
  Administered 2017-03-28: 50 mL
  Filled 2017-03-28: qty 100

## 2017-03-28 MED ORDER — LACOSAMIDE 50 MG PO TABS
150.0000 mg | ORAL_TABLET | Freq: Once | ORAL | Status: AC
  Administered 2017-03-28: 150 mg via ORAL
  Filled 2017-03-28 (×2): qty 3

## 2017-03-28 MED ORDER — ACETAMINOPHEN 500 MG PO TABS: 500.0000 mg | ORAL_TABLET | Freq: Four times a day (QID) | ORAL | 0 refills | Status: DC | PRN

## 2017-03-28 MED ORDER — LEVETIRACETAM 750 MG PO TABS
750.0000 mg | ORAL_TABLET | Freq: Once | ORAL | Status: AC
  Administered 2017-03-28: 750 mg via ORAL
  Filled 2017-03-28: qty 1

## 2017-03-28 MED ORDER — FENTANYL CITRATE (PF) 100 MCG/2ML IJ SOLN
50.0000 ug | Freq: Once | INTRAMUSCULAR | Status: AC
  Administered 2017-03-28: 50 ug via INTRAVENOUS
  Filled 2017-03-28: qty 2

## 2017-03-28 NOTE — Discharge Instructions (Signed)
You have been evaluated for your right arm pain.  No evidence of infection, stroke, blood clot, or arterial stenosis causing your symptom.  Take tylenol as needed for pain and follow up closely with your doctor for further care.

## 2017-03-28 NOTE — Progress Notes (Signed)
Preliminary results by tech - Right Upper Ext. Arterial Duplex Completed. The right arterial system is patent without evidence of stenosis or abnormalities. Oda Cogan, BS, RDMS, RVT

## 2017-03-28 NOTE — Progress Notes (Signed)
Preliminary results by tech - Right Upper Ext. Venous Duplex Completed. Negative for deep and superficial vein thrombosis in the right arm. Oda Cogan, BS, RDMS, RVT

## 2017-03-28 NOTE — ED Provider Notes (Signed)
[facility] DEPT Provider Note   CSN: [id number] Arrival date & time: 03/28/17  0962     History   Chief Complaint No chief complaint on file.   HPI Erik Salazar is a 66 y.o. male.  HPI   66 year old male with significant cardiac history including ascending aortic aneurysm status post open heart surgery brought here by EMS for evaluation of R arm pain.  Patient report 12 days ago he had open heart surgery including repair of a valve, leakage, and a sterile abscess in his chest from a prior open heart surgery a year ago. This was done at [facility]. He was hospitalized from 7/24-8/1. He was discharge in good condition. 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 He is currently on Keppra and Vimpat. For the past several days he has been complaining of dizziness and lightheadedness. He noticed tingling sensation to his right thumb and right index finger the past 2 days and since yesterday he has been complaining of pain to his right forearm. Pain is described as a pressure sensation, moderate in severity, persistent. No report of fever, chills, headache, chest pain, difficulty breathing, productive cough, abdominal pain, back pain. He was seen in the office yesterday to have sutures from his surgical site removed. At that time he discussed about this lightheadedness and dizziness and his blood pressure medication was cut in half.  Past Medical History:  Diagnosis Date  . Enlarged prostate   . H/O: knee surgery    15   . History of open heart surgery    ASCENDING AORTIC ANEURYSM  . Status post knee surgery    DVT POST KNEE SURGERY    There are no active problems to display for this patient.   Past Surgical History:  Procedure Laterality Date  . CARDIAC SURGERY     ANUERSYM MAY 2017  . KNEE SURGERY  1983   . REPLACEMENT TOTAL KNEE  2015       Home Medications    Prior to Admission medications   Medication Sig Start Date End Date Taking? Authorizing Provider  aspirin 325 MG tablet Take 325 mg by mouth daily.    [provider]  finasteride (PROSCAR) 5 MG tablet Take 5 mg by mouth daily.    [provider]  metoprolol succinate (TOPROL-XL) 25 MG 24 hr tablet Take 12.5 mg by mouth daily.    [provider]  pravastatin (PRAVACHOL) 20 MG tablet Take 20 mg by mouth daily.    [provider]  tamsulosin (FLOMAX) 0.4 MG CAPS capsule Take 0.4 mg by mouth.    [provider]    Family History Family History  Problem Relation Age of Onset  . Aneurysm Mother        brain aneurysm for mother.     Social History Social History  Substance Use Topics  . Smoking status: Never Smoker  . Smokeless tobacco: Never Used  . Alcohol use Yes     Allergies   Oxycodone hcl; Oxycontin [oxycodone]; Percocet [oxycodone-acetaminophen]; and Sulfur   Review of Systems Review of Systems  All other systems reviewed and are negative.    Physical Exam Updated Vital Signs There were no vitals taken for this visit.  Physical Exam  Constitutional: He appears well-developed and well-nourished. No distress.  HENT:  Head: Atraumatic.  Eyes: Pupils are equal,  round, and reactive to light. Conjunctivae and EOM are normal.  Neck: Normal range of motion. Neck supple.  Cardiovascular:  Tachycardia, no M/R/G  Pulmonary/Chest: Effort normal and breath sounds normal.  Normal appearing mid thoracic surgical scar with Steri-Strips in place and no signs of infection.  Abdominal: Soft. Bowel sounds are normal. He exhibits no distension. There is no tenderness.  Musculoskeletal: He exhibits tenderness (R arm: tenderness to forearm on palpation.  normal compartment, no overlying skin changes.  diminished radial pulse but brisk cap refills).  Neurological: He is alert.    Subjective decrease in sensation throughout right thumb and right index finger with normal range of motion.  Skin: No rash noted.  Ecchymosis noted along right upper medial aspect of arm  Psychiatric: He has a normal mood and affect.  Nursing note and vitals reviewed.    ED Treatments / Results  Labs (all labs ordered are listed, but only abnormal results are displayed) Labs Reviewed  BASIC METABOLIC PANEL - Abnormal; Notable for the following:       Result Value   Sodium 132 (*)    Chloride 100 (*)    Glucose, Bld 102 (*)    All other components within normal limits  CBC - Abnormal; Notable for the following:    WBC 11.3 (*)    RBC 4.15 (*)    Hemoglobin 11.9 (*)    HCT 36.4 (*)    Platelets 418 (*)    All other components within normal limits  I-STAT TROPONIN, ED    EKG  EKG Interpretation None       Radiology Ct Angio Up Extrem Right W &/or Wo Contrast  Result Date: 03/28/2017 CLINICAL DATA:  66 year old male with right arm pain and tingling and numbness, started 2 days ago. Recent thoracic surgery. EXAM: CT ANGIOGRAPHY CHEST WITH CONTRAST CT ANGIOGRAM RIGHT UPPER EXTREMITY TECHNIQUE: Multidetector CT imaging of the chest and right upper extremity was performed using the standard protocol during bolus administration of intravenous contrast. Multiplanar CT image reconstructions and MIPs were obtained to evaluate the vascular anatomy. 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 There is also a previous CABG procedure. The large fluid collection or saccular structure surrounding the aortic root and ascending thoracic aorta has recently been surgically removed. The aortic root is patent. The great vessels are patent. Visualized portions of the common carotid arteries are patent. Bilateral vertebral arteries are patent. Mild atherosclerotic disease  at the origin of the left subclavian artery without significant stenosis. Left axillary artery is patent. Right subclavian artery and right axillary artery are patent. Pulmonary arteries are patent. No large filling defects in the pulmonary arteries. Normal caliber of the aortic arch and descending thoracic aorta. Negative for an aortic dissection. Celiac trunk and SMA are patent. Bilateral renal arteries are patent. Incidentally, there is an accessory right renal artery. Mediastinum/Nodes: There is a small amount of fluid surrounding the ascending aortic graft but the large fluid collection or saccular structure around the aortic graft has resolved or been removed. There is no significant lymph node enlargement in the mediastinum or hilar regions. Lungs/Pleura: Trachea and mainstem bronchi are patent. Dependent atelectasis in both lower lobes. Small amount of pleural fluid bilaterally. Punctate nodular density in the right upper lung on sequence 7, image 48 is stable from the prior examination and may be related to a vessel. No significant  airspace disease or consolidation in the upper lobes. Upper Abdomen: Probable cyst in the posterior right kidney. Probable left parapelvic cyst. 6 mm stone in the left kidney without significant hydronephrosis. Stranding or postoperative changes along the anterior upper abdomen. Musculoskeletal: No acute bone abnormality. Prior median sternotomy. Subcutaneous gas in the anterior chest most prominent in the right chest. In addition, there is evidence for a hematoma formation in the anterior right chest. CTA right upper extremity: Vascular structures: Right axillary artery is patent. Postsurgical changes at the right axillary artery related to prior cannulation. Small amount of subcutaneous gas near the surgical changes. Right brachial artery is patent. Unfortunately, the arterial structures in the forearm and wrist are not well demonstrated due to the timing of the study. Cannot  evaluate the vessels in this area adequately. Cannot evaluate the arterial vasculature in the hand. Musculoskeletal: No significant edema in the right upper extremity. There is a defect in the medial cortex of the right humerus on sequence 3, image 66. This could represent a large nutrient foramen. No evidence for an acute fracture. Review of the MIP images confirms the above findings. IMPRESSION: Chest CTA: Postsurgical changes in the chest related to recent removal of the saccular structure around the ascending thoracic aortic graft. Subcutaneous gas and hematoma formation in the chest, right side greater than left. Findings related to recent surgery. Small bilateral pleural effusions with dependent atelectasis at the lung bases. Left kidney stone. CTA right upper extremity: Postsurgical changes in the right axillary artery without significant vascular abnormality or narrowing in this area. Limited evaluation of the arterial structures below the right elbow. Cannot evaluate the forearm or hand vasculature. Electronically Signed   By: Markus Daft M.D.   On: 03/28/2017 13:52   Dg Chest Port 1 View  Result Date: 03/28/2017 CLINICAL DATA:  Chest and left upper extremity pain EXAM: PORTABLE CHEST 1 VIEW COMPARISON:  Chest CT January 23, 2017 FINDINGS: There is atelectatic change in the left base. There is a minimal pleural effusion on each side. No edema or consolidation. Heart is borderline prominent with pulmonary vascularity within normal limits. No adenopathy. Bones are osteoporotic. Patient is status post median sternotomy. IMPRESSION: Evon minimal pleural effusions bilaterally with scarring left base. Lungs elsewhere clear. Heart mildly prominent but stable. Bones osteoporotic. Electronically Signed   By: Lowella Grip III M.D.   On: 03/28/2017 09:22   Ct Angio Chest Aorta W/cm &/or Wo/cm  Result Date: 03/28/2017 CLINICAL DATA:  66 year old male with right arm pain and tingling and numbness, started 2 days  ago. Recent thoracic surgery. EXAM: CT ANGIOGRAPHY CHEST WITH CONTRAST CT ANGIOGRAM RIGHT UPPER EXTREMITY TECHNIQUE: Multidetector CT imaging of the chest and right upper extremity was performed using the standard protocol during bolus administration of intravenous contrast. Multiplanar CT image reconstructions and MIPs were obtained to evaluate the vascular anatomy. 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 There is also a previous CABG procedure. The large fluid collection or saccular structure surrounding the aortic root and ascending thoracic aorta has recently been surgically removed. The aortic root is patent. The great vessels are patent. Visualized portions of the common carotid arteries are patent. Bilateral vertebral arteries are patent. Mild atherosclerotic disease at the origin of the left subclavian artery without significant stenosis. Left axillary artery is patent. Right subclavian artery and right axillary artery are patent. Pulmonary arteries are  patent. No large filling defects in the pulmonary arteries. Normal caliber of the aortic arch and descending thoracic aorta. Negative for an aortic dissection. Celiac trunk and SMA are patent. Bilateral renal arteries are patent. Incidentally, there is an accessory right renal artery. Mediastinum/Nodes: There is a small amount of fluid surrounding the ascending aortic graft but the large fluid collection or saccular structure around the aortic graft has resolved or been removed. There is no significant lymph node enlargement in the mediastinum or hilar regions. Lungs/Pleura: Trachea and mainstem bronchi are patent. Dependent atelectasis in both lower lobes. Small amount of pleural fluid bilaterally. Punctate nodular density in the right upper lung on sequence 7, image 48 is stable from the prior examination and may be  related to a vessel. No significant airspace disease or consolidation in the upper lobes. Upper Abdomen: Probable cyst in the posterior right kidney. Probable left parapelvic cyst. 6 mm stone in the left kidney without significant hydronephrosis. Stranding or postoperative changes along the anterior upper abdomen. Musculoskeletal: No acute bone abnormality. Prior median sternotomy. Subcutaneous gas in the anterior chest most prominent in the right chest. In addition, there is evidence for a hematoma formation in the anterior right chest. CTA right upper extremity: Vascular structures: Right axillary artery is patent. Postsurgical changes at the right axillary artery related to prior cannulation. Small amount of subcutaneous gas near the surgical changes. Right brachial artery is patent. Unfortunately, the arterial structures in the forearm and wrist are not well demonstrated due to the timing of the study. Cannot evaluate the vessels in this area adequately. Cannot evaluate the arterial vasculature in the hand. Musculoskeletal: No significant edema in the right upper extremity. There is a defect in the medial cortex of the right humerus on sequence 3, image 66. This could represent a large nutrient foramen. No evidence for an acute fracture. Review of the MIP images confirms the above findings. IMPRESSION: Chest CTA: Postsurgical changes in the chest related to recent removal of the saccular structure around the ascending thoracic aortic graft. Subcutaneous gas and hematoma formation in the chest, right side greater than left. Findings related to recent surgery. Small bilateral pleural effusions with dependent atelectasis at the lung bases. Left kidney stone. CTA right upper extremity: Postsurgical changes in the right axillary artery without significant vascular abnormality or narrowing in this area. Limited evaluation of the arterial structures below the right elbow. Cannot evaluate the forearm or hand  vasculature. Electronically Signed   By: Markus Daft M.D.   On: 03/28/2017 13:52    Procedures Procedures (including critical care time)  Preliminary results by tech - Right Upper Ext. Venous Duplex Completed. Negative for deep and superficial vein thrombosis in the right arm. Oda Cogan, BS, RDMS, RVT  Vascular Lab    [] Hide copied text [] Hover for attribution information Preliminary results by tech - Right Upper Ext. Arterial Duplex Completed. The right arterial system is patent without evidence of stenosis or abnormalities. Oda Cogan, BS, RDMS, RVT        Medications Ordered in ED Medications  fentaNYL (SUBLIMAZE) injection 50 mcg (50 mcg Intravenous Given 03/28/17 0926)  iopamidol (ISOVUE-370) 76 % injection (50 mLs  Contrast Given 03/28/17 1207)  iopamidol (ISOVUE-370) 76 % injection 50 mL (50 mLs Intravenous Contrast Given 03/28/17 1210)  fentaNYL (SUBLIMAZE) injection 50 mcg (50 mcg Intravenous Given 03/28/17 1523)  lacosamide (VIMPAT) tablet 150 mg (150 mg Oral Given 03/28/17 1528)  levETIRAcetam (KEPPRA) tablet 750 mg (750 mg Oral Given 03/28/17 1523)  Initial Impression / Assessment and Plan / ED Course  I have reviewed the triage vital signs and the nursing notes.  Pertinent labs & imaging results that were available during my care of the patient were reviewed by me and considered in my medical decision making (see chart for details).     BP (!) 119/55   Pulse (!) 103   Temp 97.6 F (36.4 C) (Oral)   Resp 15   Ht 5\' 10"  (1.778 m)   Wt 71.2 kg (157 lb)   SpO2 96%   BMI 22.53 kg/m    Final Clinical Impressions(s) / ED Diagnoses   Final diagnoses:  None    New Prescriptions New Prescriptions   No medications on file   8:56 AM Patient here with tingling sensation to his right index and thumb as  as well as is tenderness to his right forearm. Recent open heart surgery. History of ascending aortic aneurysm. On exam, he does have  subjective decreased  sensation to his affected fingers with full range of motion. His right forearm is tender to the touch with normal forearm compartment. Diminished radial pulse with brisk cap refill. Given recent surgery, will obtain CTA dissection study as well as obtaining venous duplex ultrasound of right arm to rule out either arterial or venous etiology causing his pain. No signs of infection.   1:56 PM Labs are reassuring. Initial chest x-ray shows minimal pleural effusion but no other concerning feature. CT angiogram of the chest and the right upper extremities showing postsurgical changes without any concerning feature. However limited evaluation of the arterial structure below the right elbow therefore cannot evaluate forearm or hand vasculature. I did discuss this specifically with the radiologist who recommend obtaining a arterial duplex ultrasound to assess radial and ulnar artery. Patient made aware of finding, test ordered. A venous Doppler ultrasound performed today show no evidence of DVT in the right upper extremity  2:33 PM Pt receiving additional pain medication for his R forearm pain.  I also give pt his daily anti-seizure medications (Vimpat 150mg , and Keppra 750mg ).  Currently awaits vascular US.  Care discussed with DR. Long.   3:54 PM Arterial duplex studies shown no evidence of arterial occlusion. At this time, I have low suspicion for stroke causing his symptoms, doubt neurovascular compromise, no signs of compartment syndrome, no evidence suggestive of fractures or dislocation. I encouraged patient to follow-up closely with his doctor for further care. Return precaution discussed. Recommend Tylenol as needed for pain.   Domenic Moras, PA-C 03/28/17 1600    Long, Wonda [facility], MD 03/28/17 [phone]

## 2017-03-28 NOTE — ED Triage Notes (Signed)
Patient presents to ed via [facility] states he had CABG 7/25 c/o right arm pain onset yest, states he went to [facility]  To have staples out yest and states he mention that he was having arm pain was told to call the surgeron  Of which his wife did and was told it was prob, from the lopressor and lasix he was taking. States the pain was more severe yest afternoon. Pulse equal bilaterally right hand warm to touch .

## 2017-04-01 DIAGNOSIS — M79601 Pain in right arm: Secondary | ICD-10-CM | POA: Diagnosis not present

## 2017-04-01 DIAGNOSIS — I712 Thoracic aortic aneurysm, without rupture: Secondary | ICD-10-CM | POA: Diagnosis not present

## 2017-04-01 DIAGNOSIS — I639 Cerebral infarction, unspecified: Secondary | ICD-10-CM | POA: Diagnosis not present

## 2017-04-02 DIAGNOSIS — J90 Pleural effusion, not elsewhere classified: Secondary | ICD-10-CM | POA: Diagnosis not present

## 2017-04-02 DIAGNOSIS — Z952 Presence of prosthetic heart valve: Secondary | ICD-10-CM | POA: Diagnosis not present

## 2017-04-02 DIAGNOSIS — I4510 Unspecified right bundle-branch block: Secondary | ICD-10-CM | POA: Diagnosis not present

## 2017-04-02 DIAGNOSIS — I10 Essential (primary) hypertension: Secondary | ICD-10-CM | POA: Diagnosis not present

## 2017-04-02 DIAGNOSIS — Z8679 Personal history of other diseases of the circulatory system: Secondary | ICD-10-CM | POA: Diagnosis not present

## 2017-04-02 DIAGNOSIS — R918 Other nonspecific abnormal finding of lung field: Secondary | ICD-10-CM | POA: Diagnosis not present

## 2017-04-02 DIAGNOSIS — Z4889 Encounter for other specified surgical aftercare: Secondary | ICD-10-CM | POA: Diagnosis not present

## 2017-04-02 DIAGNOSIS — Z951 Presence of aortocoronary bypass graft: Secondary | ICD-10-CM | POA: Diagnosis not present

## 2017-04-02 DIAGNOSIS — I4581 Long QT syndrome: Secondary | ICD-10-CM | POA: Diagnosis not present

## 2017-04-02 DIAGNOSIS — Z953 Presence of xenogenic heart valve: Secondary | ICD-10-CM | POA: Diagnosis not present

## 2017-04-02 DIAGNOSIS — I498 Other specified cardiac arrhythmias: Secondary | ICD-10-CM | POA: Diagnosis not present

## 2017-04-02 DIAGNOSIS — I440 Atrioventricular block, first degree: Secondary | ICD-10-CM | POA: Diagnosis not present

## 2017-04-02 DIAGNOSIS — Z09 Encounter for follow-up examination after completed treatment for conditions other than malignant neoplasm: Secondary | ICD-10-CM | POA: Diagnosis not present

## 2017-04-02 DIAGNOSIS — Z9889 Other specified postprocedural states: Secondary | ICD-10-CM | POA: Diagnosis not present

## 2017-04-05 ENCOUNTER — Ambulatory Visit (INDEPENDENT_AMBULATORY_CARE_PROVIDER_SITE_OTHER): Payer: Medicare HMO | Admitting: Neurology

## 2017-04-05 ENCOUNTER — Encounter: Payer: Self-pay | Admitting: Neurology

## 2017-04-05 VITALS — BP 96/66 | HR 86 | Ht 70.0 in | Wt 162.0 lb

## 2017-04-05 DIAGNOSIS — M79641 Pain in right hand: Secondary | ICD-10-CM | POA: Insufficient documentation

## 2017-04-05 DIAGNOSIS — M79601 Pain in right arm: Secondary | ICD-10-CM

## 2017-04-05 DIAGNOSIS — G5601 Carpal tunnel syndrome, right upper limb: Secondary | ICD-10-CM

## 2017-04-05 DIAGNOSIS — G40909 Epilepsy, unspecified, not intractable, without status epilepticus: Secondary | ICD-10-CM

## 2017-04-05 DIAGNOSIS — I639 Cerebral infarction, unspecified: Secondary | ICD-10-CM

## 2017-04-05 MED ORDER — LEVETIRACETAM 750 MG PO TABS: 1500.0000 mg | ORAL_TABLET | Freq: Two times a day (BID) | ORAL | 5 refills | Status: DC

## 2017-04-05 MED ORDER — GABAPENTIN 300 MG PO CAPS: 300.0000 mg | ORAL_CAPSULE | Freq: Three times a day (TID) | ORAL | 11 refills | Status: DC

## 2017-04-05 MED ORDER — LACOSAMIDE 50 MG PO TABS: ORAL_TABLET | ORAL | 1 refills | Status: DC

## 2017-04-05 NOTE — Patient Instructions (Signed)
   We will slowly taper off of the Vimpat. We will increase the gabapentin to 300 mg three times a day. Start using a wrist splint.  We will get an EMG and NCV study to look at the nerve function of the right arm.  Neurontin (gabapentin) may result in drowsiness, ankle swelling, gait instability, or possibly dizziness. Please contact our office if significant side effects occur with this medication.

## 2017-04-05 NOTE — Progress Notes (Signed)
Reason for visit: Stroke, seizures, right arm pain  Referring physician: Dr. Benna Dunks is a 66 y.o. male  History of present illness:  Mr. Ohalloran is a 66 year old right-hand white male with a history of an ascending aortic aneurysm that was repaired on 01/04/2016. The patient did not have the surgery done here locally. The patient was seen by Dr. Nils Pyle for what appeared to be an upper abdominal hernia. 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 The patient was sent to [facility] thoracic surgery for an evaluation. The patient underwent surgery in the later part of July 2018, the patient was actually found to have a sterile abscess, not a pseudoaneurysm. The surgery was complicated by bifrontal stroke events, the patient began having seizures with left arm jerking. The patient had focal seizures without loss of consciousness. The patient was placed on Vimpat and Keppra. EEG evaluation showed right frontal irritability. The patient has recovered well from this, he has not had any recurring seizures since discharge from the hospital around 03/20/2017. Within several days after discharge, the patient began having pain and discomfort in the right forearm into the hand, he also reported numbness into the thumb and index finger on the right hand and a dull achy pain in the forearm. With physical activity involving arm he gets sharp jabbing pains that go down into the hand. The patient does not have hypersensitivity of the skin of the hand. The patient denies any true weakness, he denies neck pain or pain down the arm. He has not had any difficulty with the left arm or difficulty with the lower extremities. The patient denies any issues controlling the bowels or the bladder. He denies that any head or neck movement will induce the pain in the arm. The pain generally will come on with gripping or lifting using  the right arm. The patient is not sleeping well in part because of the discomfort. He is sent to this office for further evaluation.  Past Medical History:  Diagnosis Date  . Enlarged prostate   . H/O: knee surgery    15   . History of open heart surgery    ASCENDING AORTIC ANEURYSM  . Ischemic stroke of frontal lobe ([place]) 04/05/2017   Bilateral  . Seizure disorder () 04/05/2017  . Seizures ([place])   . Status post knee surgery    DVT POST KNEE SURGERY    Past Surgical History:  Procedure Laterality Date  . CARDIAC SURGERY     ANUERSYM MAY 2017  . KNEE SURGERY  1983  . open heart surgery     03-13-2017  . REPLACEMENT TOTAL KNEE  2015  . ROTATOR CUFF REPAIR      Family History  Problem Relation Age of Onset  . Aneurysm Mother        brain aneurysm for mother.     Social history:  reports that he has never smoked. He has never used smokeless tobacco. He reports that he does not drink alcohol or use drugs.  Medications:  Prior to Admission medications   Medication Sig Start Date End Date Taking? Authorizing Provider  aspirin 325 MG tablet Take 325 mg by mouth daily.   Yes [provider]  finasteride (PROSCAR) 5 MG tablet Take 5 mg by mouth daily.   Yes [provider]  gabapentin (NEURONTIN) 100 MG capsule Take 100 mg by mouth  3 (three) times daily.   Yes [provider]  Lacosamide 150 MG TABS Take 150 mg by mouth 2 (two) times daily. 03/20/17 03/20/18 Yes [provider]  levETIRAcetam (KEPPRA) 750 MG tablet Take 1,500 mg by mouth 2 (two) times daily. 03/20/17 03/20/18 Yes [provider]  MAGNESIUM-OXIDE 400 (241.3 Mg) MG tablet Take 800 mg by mouth 2 (two) times daily. 03/20/17  Yes [provider]  metoprolol tartrate (LOPRESSOR) 50 MG tablet Take 50 mg by mouth every evening. Does not take if Bp sys 90 or below 03/20/17  Yes [provider]  pravastatin (PRAVACHOL) 40 MG tablet Take 40 mg by mouth every evening. 03/20/17  03/20/18 Yes [provider]  tamsulosin (FLOMAX) 0.4 MG CAPS capsule Take 0.4 mg by mouth every evening.    Yes [provider]      Allergies  Allergen Reactions  . Oxycodone Hcl     DELUSIONAL  . Oxycontin [Oxycodone]     DELUSIONAL   . Percocet [Oxycodone-Acetaminophen]     DELUSIONAL  . Sulfur Rash    ROS:  Out of a complete 14 system review of symptoms, the patient complains only of the following symptoms, and all other reviewed systems are negative.  Fatigue Joint pain, achy muscles Numbness, weakness, dizziness, tremor Not enough sleep Insomnia  Blood pressure 96/66, pulse 86, height 5\' 10"  (1.778 m), weight 162 lb (73.5 kg).  Physical Exam  General: The patient is alert and cooperative at the time of the examination.  Eyes: Pupils are equal, round, and reactive to light. Discs are flat bilaterally.  Neck: The neck is supple, no carotid bruits are noted.  Respiratory: The respiratory examination is clear.  Cardiovascular: The cardiovascular examination reveals a regular rate and rhythm, no obvious murmurs or rubs are noted.  Neuromuscular: Range of movement of the cervical spine is full.  Skin: Extremities are without significant edema.  Neurologic Exam  Mental status: The patient is alert and oriented x 3 at the time of the examination. The patient has apparent normal recent and remote memory, with an apparently normal attention span and concentration ability.  Cranial nerves: Facial symmetry is present. There is good sensation of the face to pinprick and soft touch bilaterally. The strength of the facial muscles and the muscles to head turning and shoulder shrug are normal bilaterally. Speech is well enunciated, no aphasia or dysarthria is noted. Extraocular movements are full. Visual fields are full. The tongue is midline, and the patient has symmetric elevation of the soft palate. No obvious hearing deficits are noted.  Motor: The motor  testing reveals 5 over 5 strength of all 4 extremities. Good symmetric motor tone is noted throughout.  Sensory: Sensory testing is intact to pinprick, soft touch, vibration sensation, and position sense on all 4 extremities, with exception of some decreased pinprick sensation from the elbow down into the hand on the right. No evidence of extinction is noted.  Coordination: Cerebellar testing reveals good finger-nose-finger and heel-to-shin bilaterally.  Gait and station: Gait is normal. Tandem gait is normal. Romberg is negative. No drift is seen.  Reflexes: Deep tendon reflexes are symmetric and normal bilaterally. Toes are downgoing bilaterally.   Assessment/Plan:  1. Right arm discomfort  2. Bifrontal strokes, postoperative  3. Seizures, right frontal focus  The patient is having what appears to be a mechanical pain source in the right arm and forearm that comes on with lifting or gripping of the right hand. However, the patient  also reports numbness that goes into the index and thumb, and he reports sensory alteration from the elbow down. CT angiogram of the right arm did not show any definite blood vessel compromise, a venous Doppler study was negative for DVT. The patient has been placed on gabapentin the low-dose which is not effective. The dose will be increased to 300 mg 3 times daily, he will have a slow taper off of the Vimpat and remain on Keppra. The patient will be set up for nerve conduction studies of both arms and EMG on the right arm. He was given a prescription for a wrist splint to help immobilize the right wrist. He will follow-up for the EMG evaluation, he will have a revisit in 3 months. He will call for dose adjustment of the gabapentin. A prescription was also sent in for Keppra.  Jill Alexanders MD 04/05/2017 11:12 AM  [facility] [address] [place], [place] [address]  Phone [phone] Fax [phone]

## 2017-04-08 DIAGNOSIS — N400 Enlarged prostate without lower urinary tract symptoms: Secondary | ICD-10-CM | POA: Diagnosis not present

## 2017-04-08 DIAGNOSIS — Z0000 Encounter for general adult medical examination without abnormal findings: Secondary | ICD-10-CM | POA: Diagnosis not present

## 2017-04-09 ENCOUNTER — Telehealth: Payer: Self-pay | Admitting: Neurology

## 2017-04-09 MED ORDER — GABAPENTIN 300 MG PO CAPS: ORAL_CAPSULE | ORAL | 2 refills | Status: DC

## 2017-04-09 NOTE — Telephone Encounter (Signed)
Pt wife called back stating she missed the call but is asking for a call back at your convience.

## 2017-04-09 NOTE — Telephone Encounter (Signed)
Pt wife(one DPR) calling to inform that even with the arm brace and medication pt still having difficulty sleeping, pt wife is asking to be called

## 2017-04-09 NOTE — Telephone Encounter (Signed)
I called the wife, the patient is having some achy discomfort in the evening hours with arm pain, we will go up on the gabapentin taking 300 mg twice during the day and 600 mg at night. We can continue to go up on the dosing if needed.

## 2017-04-09 NOTE — Addendum Note (Signed)
Addended by: Kathrynn Ducking on: 04/09/2017 12:59 PM   Modules accepted: Orders

## 2017-04-23 ENCOUNTER — Other Ambulatory Visit (HOSPITAL_COMMUNITY): Payer: Self-pay | Admitting: *Deleted

## 2017-04-23 ENCOUNTER — Encounter: Payer: Self-pay | Admitting: Cardiology

## 2017-04-23 ENCOUNTER — Telehealth: Payer: Self-pay | Admitting: Cardiology

## 2017-04-23 ENCOUNTER — Telehealth (HOSPITAL_COMMUNITY): Payer: Self-pay | Admitting: *Deleted

## 2017-04-23 ENCOUNTER — Ambulatory Visit (INDEPENDENT_AMBULATORY_CARE_PROVIDER_SITE_OTHER): Payer: Medicare HMO | Admitting: Cardiology

## 2017-04-23 ENCOUNTER — Encounter (INDEPENDENT_AMBULATORY_CARE_PROVIDER_SITE_OTHER): Payer: Self-pay

## 2017-04-23 VITALS — BP 98/72 | HR 78 | Ht 70.0 in | Wt 166.8 lb

## 2017-04-23 DIAGNOSIS — Z8679 Personal history of other diseases of the circulatory system: Secondary | ICD-10-CM

## 2017-04-23 DIAGNOSIS — I959 Hypotension, unspecified: Secondary | ICD-10-CM | POA: Diagnosis not present

## 2017-04-23 DIAGNOSIS — R531 Weakness: Secondary | ICD-10-CM | POA: Diagnosis not present

## 2017-04-23 DIAGNOSIS — K439 Ventral hernia without obstruction or gangrene: Secondary | ICD-10-CM

## 2017-04-23 DIAGNOSIS — Z951 Presence of aortocoronary bypass graft: Secondary | ICD-10-CM | POA: Diagnosis not present

## 2017-04-23 DIAGNOSIS — I729 Aneurysm of unspecified site: Secondary | ICD-10-CM | POA: Diagnosis not present

## 2017-04-23 DIAGNOSIS — I2510 Atherosclerotic heart disease of native coronary artery without angina pectoris: Secondary | ICD-10-CM

## 2017-04-23 DIAGNOSIS — Z9889 Other specified postprocedural states: Secondary | ICD-10-CM | POA: Diagnosis not present

## 2017-04-23 DIAGNOSIS — E7800 Pure hypercholesterolemia, unspecified: Secondary | ICD-10-CM | POA: Diagnosis not present

## 2017-04-23 MED ORDER — AMOXICILLIN 500 MG PO CAPS
ORAL_CAPSULE | ORAL | 0 refills | Status: DC
Start: 2017-04-23 — End: 2017-07-08

## 2017-04-23 MED ORDER — METOPROLOL SUCCINATE ER 25 MG PO TB24: 12.5000 mg | ORAL_TABLET | Freq: Every day | ORAL | 11 refills | Status: DC

## 2017-04-23 MED ORDER — METOPROLOL SUCCINATE ER 25 MG PO TB24: 12.5000 mg | ORAL_TABLET | Freq: Every day | ORAL | 3 refills | Status: DC

## 2017-04-23 NOTE — Telephone Encounter (Signed)
-----   Message from Isaiah Serge, NP sent at 04/23/2017  1:13 PM EDT ----- Regarding: FW: Cardiac Rehab Dr. Marlou Porch can you send Phase II order to cardiac rehab for Mr. Monforte post surgery at [facility] s/p sterile abscess removal from dacron graft and omental flap around the aorta, CABG X 1 on 03/13/17 thanks.  Mickel Baas  ----- Message ----- From: Rowe Pavy, RN Sent: 04/23/2017  12:13 PM To: Isaiah Serge, NP Subject: Cardiac Rehab                                  Mickel Baas,   The above patient of Dr. Marlou Porch stopped by the cardiac rehab department this morning inquiring on scheduling for cardiac rehab.  Looks like he was seen by you this morning in follow up from his redo heart surgery at [facility].  He has a note for clearance to participate in CR from Dr. Ysidro Evert (surgeon at [facility]). Will Dr. Marlou Porch place an order for this pt for cardiac rehab or would sending over a paper order to be signed be quicker??  As you know pt is eager:) to start participating.  I have sent a message to Dr. Benson Norway for clearance from a neurology standpoint.  How best to proceed? Thanks for your help Maurice Small RN, BSN Cardiac and Pulmonary Rehab Nurse Navigator

## 2017-04-23 NOTE — Progress Notes (Signed)
Cardiology Office Note   Date:  04/24/2017   ID:  Erik Salazar, DOB Jun 16, 1951, MRN [id number]  PCP:  [name]  Cardiologist:  Dr. Marlou Porch    Chief Complaint  Patient presents with  . Coronary Artery Disease      History of Present Illness: Erik Salazar is a 66 y.o. male who presents for CAD and post sterile abscess removal from dacron graft , omental flap around aorta and CABG X 1 LIMA to LAD on 03/13/17.    He has a hx of Aortic aneurysm repair-- in May 2017 he was found to have an ascending aortic aneurysm and had repair in [place]. He stated that they "repaired 2 valves "at the same time. He had a small hernia at the base of his incision that was being monitored. He was told to wait a full year before having this fixed by a Psychologist, sport and exercise. He complained of mild discomfort with hernia to Dr. Marlou Porch in April this year.    One-year prior to his 2017 surgery he had a documented 4 cm enlargement of his ascending aorta. He then underwent a another CAT scan of his chest which showed an aneurysm as large as 5.3 cm. There was no evidence of dissection no hematoma. He cardiac catheterization that showed no significant coronary artery disease. The aneurysm was located in the ascending aorta just above the sinotubular ridge, saccular.  He underwent placement of his ascending aorta and aortic valve. Bentall procedure. Bioprosthetic aortic valve #23 mm bovine model #2700 TF ask, and 28 mm Gelweave woven vascular sinus of Valsalva graft. He had reimplantation of the left and right coronary ostia. Coronary artery bypass grafting 2 also utilizing the SVG from aorta to LAD and circumflex artery were placed. Left leg vein. This was performed on 01/04/16 by Dr. Matthias Hughs at [facility] in [place].  He's had 14 total knee surgery since 1983 and a knee replacement in 2015. He had a DVT post knee surgery.  He's never smoked. Was walking up to 1-2 miles  a day. Stuggles afterwards. Sometimes walking into the wind hard to breath. Was very active before this.   He has been compliant with his medications.  Has esophageal dil x 3. . Retired Control and instrumentation engineer in jail.   In past he has trouble taking BP meds due to hypotension.      In June pt had CTA of chest to eval ventral hernia.  A false aneurysm was noted at the distal suture line.  Dr. Prescott Gum recommended surgical repair of the false aneurysm.  Pt and wife had a second opinion at [facility]  With Dr. Ysidro Evert.  Recommendations " This is likely the source of his symptoms as there appears to be mass-effect on the right pulmonary artery and SVC. It also appears on imaging that the vein graft to his LAD is stenosed, and it is possible that he is experiencing angina. The patient will therefore require repair of the pseudoaneurysm, which will likely require replacement of that segment of the aorta and hemi-arch. 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  He did have intra op CVA and now residual weakness in his Lt hand and arm. Also with seizure.  He is now followed by Dr. [name] for seizure and he has pain in rt hand that he is wearing a brace on and to have EMG next week.     Today he tells me he has been recovering and is walking daily.  He does plan to return to cardiac rehab.  His diet is healthy.  With inability to use hands well he has had a fall and has bruise to eye and arm.  He is on higher dose of BB and BP is low, concern is with imbalance in combination with low BP he is at greater risk for  falls.    He is frustrated with his health.  They moved to this area for retirement and he has been recovering from surgeries since.  He is not suicidal but depressed.  Though he and his wife are seeing improvement in his ability to walk  And function.        Past Medical History:  Diagnosis Date  . Enlarged prostate   . H/O: knee surgery    15   . History of open heart surgery    ASCENDING AORTIC ANEURYSM  . Ischemic stroke of frontal lobe ([place]) 04/05/2017   Bilateral  . Seizure disorder ([place]) 04/05/2017  . Seizures ([place])   . Status post knee surgery    DVT POST KNEE SURGERY    Past Surgical History:  Procedure Laterality Date  . CARDIAC SURGERY     ANUERSYM MAY 2017  . KNEE SURGERY  1983  . open heart surgery     03-13-2017  . REPLACEMENT TOTAL KNEE  2015  . ROTATOR CUFF REPAIR       Current Outpatient Prescriptions  Medication Sig Dispense Refill  . aspirin 325 MG tablet Take 325 mg by mouth daily.    . finasteride (PROSCAR) 5 MG tablet Take 5 mg by mouth daily.    Marland Kitchen gabapentin (NEURONTIN) 300 MG capsule One capsule in the morning and at midday, 2 in the evening 120 capsule 2  . lacosamide (VIMPAT) 50 MG TABS tablet Begin 2 tablets twice a day for 4 weeks, then take one tablet twice a day for 4 weeks, then stop. 120 tablet 1  . levETIRAcetam (KEPPRA) 750 MG tablet Take 2 tablets (1,500 mg total) by mouth 2 (two) times daily. 120 tablet 5  . pravastatin (PRAVACHOL) 40 MG tablet Take 40 mg by mouth every evening.    . tamsulosin (FLOMAX) 0.4 MG CAPS capsule Take 0.4 mg by mouth every evening.     Marland Kitchen amoxicillin (AMOXIL) 500 MG capsule Take 2 grams (4 tablets) 1 hour prior to any dental procedures 4 capsule 0  . metoprolol succinate (TOPROL-XL) 25 MG 24 hr tablet Take 0.5 tablets (12.5 mg total) by mouth daily. Take with or immediately following a meal. 45 tablet 3   No current facility-administered medications for this visit.     Allergies:   Oxycodone hcl; Oxycontin  [oxycodone]; Percocet [oxycodone-acetaminophen]; and Sulfur    Social History:  The patient  reports that he has never smoked. He has never used smokeless tobacco. He reports that he does not drink alcohol or use drugs.   Family History:  The patient's family history includes Aneurysm in his mother.    ROS:  General:no colds or fevers, no weight changes  Skin:no rashes or ulcers HEENT:no blurred vision, no congestion CV:see HPI PUL:see HPI GI:no diarrhea constipation or melena, no indigestion GU:no hematuria, no dysuria MS:no joint pain, no claudication Neuro:no syncope, no lightheadedness, now with seizures.   Endo:no diabetes, no thyroid disease  Wt Readings from Last 3 Encounters:  04/23/17 166 lb 12.8 oz (75.7 kg)  04/05/17 162 lb (73.5 kg)  03/28/17 157 lb (71.2 kg)     PHYSICAL EXAM: VS:  BP 98/72   Pulse 78   Ht 5\' 10"  (1.778 m)   Wt 166 lb 12.8 oz (75.7 kg)   SpO2 98%   BMI 23.93 kg/m  , BMI Body mass index is 23.93 kg/m. General:Pleasant affect, NAD Skin:Warm and dry, brisk capillary refill HEENT:normocephalic, sclera clear, mucus membranes moist Neck:supple, no JVD, no bruits  Heart:S1S2 RRR without murmur, gallup, rub or click, chest incision without drainage and healing well. Lungs:clear without rales, rhonchi, or wheezes RWE:RXVQ, non tender, + BS, do not palpate liver spleen or masses Ext:no lower ext edema, 2+ pedal pulses, 2+ radial pulses, Rt hand with brace, difficulty moving Lt hand and pain moving rt hand.  Neuro:alert and oriented X3, MAE, follows commands, + facial symmetry    EKG:  EKG is ordered today. The ekg ordered today demonstrates SR T wave depressed lateral leads.    Recent Labs: 12/13/2016: ALT 13; TSH 1.060 03/28/2017: BUN 18; Creatinine, Ser 0.99; Hemoglobin 11.9; Platelets 418; Potassium 4.4; Sodium 132    Lipid Panel    Component Value Date/Time   CHOL 199 12/13/2016 0942   TRIG 166 (H) 12/13/2016 0942   HDL 45 12/13/2016  0942   CHOLHDL 4.4 12/13/2016 0942   LDLCALC 121 (H) 12/13/2016 0086       Other studies Reviewed: Additional studies/ records that were reviewed today include: . Echo 12/13/16  Study Conclusions  - Left ventricle: The cavity size was normal. There was mild   concentric hypertrophy. Systolic function was normal. The   estimated ejection fraction was in the range of 60% to 65%. Wall   motion was normal; there were no regional wall motion   abnormalities. Features are consistent with a pseudonormal left   ventricular filling pattern, with concomitant abnormal relaxation   and increased filling pressure (grade 2 diastolic dysfunction).   Doppler parameters are consistent with high ventricular filling   pressure. - Aortic valve: A bioprosthesis was present and functioning   normally. Transvalvular velocity was within the normal range.   There was no stenosis. There was no regurgitation. Valve area   (VTI): 2.62 cm^2. Valve area (Vmax): 2.49 cm^2. Valve area   (Vmean): 2.69 cm^2. - Mitral valve: Transvalvular velocity was within the normal range.   There was no evidence for stenosis. There was trivial   regurgitation. - Right ventricle: The cavity size was normal. Wall thickness was   normal. Systolic function was normal. - Atrial septum: No defect or patent foramen ovale was identified. - Tricuspid valve: There was mild regurgitation. - Pulmonary arteries: Systolic pressure was within the normal   range. PA peak pressure: 29 mm Hg (S).   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  Weakness, imbalance  hypotension will change lopressor to toprol XL 25 mg take half tablet daily.  ie 12.5 mg.  ----follow up with Dr. Marlou Porch in 6 weeks.  2.  S/p sterile abscess removal from dacron graft omental flap around aorta.  Also LIMA to LAD 03/13/17  3.   Hx ascending aortic aneurysm repair s/p bentall procedure and bioprosthetic aortic valve replacement.  12/2015        --stable on last echo, SBE prophylaxis, and asa 325 mg dialy   4.    CAD with prior CABG  2 also utilizing the SVG from aorta to LAD and circumflex artery were placed 01/04/16, now LIMA to LAD 03/13/17 no cardiac pain.  Pt to begin phase II cardiac rehab  5... HLD on pravastatin higher dose will recheck in 4-6 weeks thru PCP  6.   CVA with lt arm weakness followed by Dr. [name]  7.   Seizure since CVA followed by Dr. [name].    8.   Ventral hernia stable.     Current medicines are reviewed with the patient today.  The patient Has no concerns regarding medicines.  The following changes have been made:  See above Labs/ tests ordered today include:see above  Disposition:   FU:  see above  Signed, Cecilie Kicks, NP  04/24/2017 3:01 PM    [address], [place], [place] Phone: [phone]; Fax: [phone]

## 2017-04-23 NOTE — Patient Instructions (Signed)
Medication Instructions:  Your physician has recommended you make the following change in your medication:   1. STOP metoprolol tartrate (lopressor).  2. START metoprolol succinate (Toprol-XL). Take 12.5 mg (1/2 tablet) daily.   Labwork: Have your primary care doctor check LIPIDs (your cholesterol) in 6 weeks.  Testing/Procedures: None ordered  Follow-Up: Your physician wants you to follow-up in: 6 weeks with Dr. Marlou Porch.  Any Other Special Instructions Will Be Listed Below (If Applicable).  Discussed the use of prophylactic antibiotics before dental work and other surgeries.    If you need a refill on your cardiac medications before your next appointment, please call your pharmacy.

## 2017-04-23 NOTE — Telephone Encounter (Signed)
-----   Message from Kathrynn Ducking, MD sent at 04/23/2017  7:24 PM EDT ----- Regarding: RE: Clearance to participate in cardiac rehab OK from my standpoint for cardiac rehab ----- Message ----- From: Rowe Pavy, RN Sent: 04/23/2017  12:18 PM To: Kathrynn Ducking, MD Subject: Clearance to participate in cardiac rehab      Dr. [name],  The above pt has been referred to cardiac rehab s/p redo heart surgery on 7/25 at [facility].  Pt recently seen by you on 8/17 in follow up from Bifrontal Stroke.  Pt with history of seizures.  Pt will have nerve conduction study on 9/12.  Based upon your assessment, may pt participate in group exercise? Any restrictions of activity or sensory (sound/noise)  limitations needed?   Thank you for your input, we look forward to working with this patient!  Cherre Huger, BSN Cardiac and Training and development officer

## 2017-04-24 ENCOUNTER — Encounter: Payer: Self-pay | Admitting: Cardiology

## 2017-04-26 ENCOUNTER — Telehealth (HOSPITAL_COMMUNITY): Payer: Self-pay

## 2017-04-26 NOTE — Telephone Encounter (Signed)
Returned patient call. I left message on patient voicemail, office contact information.

## 2017-04-26 NOTE — Telephone Encounter (Signed)
Patient insurance is active and benefits verified.patient insurance is [employer] - no co-payment, no deductible, no out of pocket, no co-insurance and no pre-authorization. Passport/reference [id number].

## 2017-04-26 NOTE — Telephone Encounter (Signed)
I called and left message on voicemail to call office about scheduling for cardiac rehab. I left office contact information on patient voicemail to return call.  ° °

## 2017-05-01 ENCOUNTER — Ambulatory Visit (INDEPENDENT_AMBULATORY_CARE_PROVIDER_SITE_OTHER): Payer: Medicare HMO | Admitting: Neurology

## 2017-05-01 ENCOUNTER — Encounter: Payer: Self-pay | Admitting: Neurology

## 2017-05-01 ENCOUNTER — Ambulatory Visit (INDEPENDENT_AMBULATORY_CARE_PROVIDER_SITE_OTHER): Payer: Self-pay | Admitting: Neurology

## 2017-05-01 DIAGNOSIS — M79601 Pain in right arm: Secondary | ICD-10-CM

## 2017-05-01 MED ORDER — GABAPENTIN 400 MG PO CAPS: ORAL_CAPSULE | ORAL | 1 refills | Status: DC

## 2017-05-01 NOTE — Progress Notes (Addendum)
The patient comes in today for EMG and nerve conduction study. Some denervation is seen in the right pronator teres muscle and mild denervation in the biceps muscle excluding the cervical paraspinal muscles. The possibility of a C6 radiculopathy is entertained, but cannot be confirmed as the cervical paraspinal muscles were not involved. The possibility of a brachial plexopathy is also considered.  MRI of the cervical spine will be done. Gabapentin dosing will be increased to 400 mg twice during the day and 800 mg at night.  The patient reports some problems with tremors in both arms since the stroke event.  Mild right carpal tunnel syndrome was noted on nerve conduction studies. The patient will continue to wear the splint.      [place]    Nerve / Sites Muscle Latency Ref. Amplitude Ref. Rel Amp Segments Distance Velocity Ref. Area    ms ms mV mV %  cm m/s m/s mVms  L Median - APB     Wrist APB 4.0 ?4.4 10.0 ?4.0 100 Wrist - APB 7   32.6     Upper arm APB 8.1  9.5  95.5 Upper arm - Wrist 23 55 ?49 31.7  R Median - APB     Wrist APB 4.0 ?4.4 13.1 ?4.0 100 Wrist - APB 7   42.3     Upper arm APB 8.9  9.8  74.8 Upper arm - Wrist   ?49 37.2  L Ulnar - ADM     Wrist ADM 3.5 ?3.3 9.2 ?6.0 100 Wrist - ADM 7   31.9     B.Elbow ADM 7.8  7.8  84.3 B.Elbow - Wrist 21 50 ?49 35.0     A.Elbow ADM 9.7  7.3  94 A.Elbow - B.Elbow 10 52 ?49 26.3         A.Elbow - Wrist      R Ulnar - ADM     Wrist ADM 3.3 ?3.3 9.0 ?6.0 100 Wrist - ADM 7   32.9     B.Elbow ADM 7.4  8.7  96.6 B.Elbow - Wrist 22 53 ?49 32.4     A.Elbow ADM 9.3  8.6  98.2 A.Elbow - B.Elbow 10 53 ?49 32.0         A.Elbow - Wrist                 SNC    Nerve / Sites Rec. Site Peak Lat Amp Segments Distance    ms V  cm  L Median - Orthodromic (Dig II, Mid palm)     Dig II Wrist 3.8 20 Dig II - Wrist 13  R Median - Orthodromic (Dig II, Mid palm)     Dig II Wrist 4.2 7 Dig II - Wrist 13  L Ulnar - Orthodromic, (Dig V, Mid palm)     Dig  V Wrist 3.4 21 Dig V - Wrist 11  R Ulnar - Orthodromic, (Dig V, Mid palm)     Dig V Wrist 4.1 7 Dig V - Wrist 32             F  Wave    Nerve F Lat Ref.   ms ms  L Median - APB 30.3 ?31.0  L Ulnar - ADM 32.0 ?32.0  R Median - APB 30.9 ?31.0

## 2017-05-01 NOTE — Procedures (Signed)
     HISTORY:  Dio Giller is a 66 year old gentleman with a history of right hand and arm discomfort. 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  NERVE CONDUCTION STUDIES:  Nerve conduction studies of both upper extremities were performed. The distal motor latency for the right median nerve was borderline normal, with a normal motor amplitude. The distal motor latency and motor amplitude for the left median nerve was normal. The distal motor latencies and motor amplitudes for the ulnar nerves were normal bilaterally. The nerve conduction velocities for the median and ulnar nerves were normal bilaterally. The sensory latency for the right median nerve was prolonged, normal on the left median nerve and normal for the ulnar nerves bilaterally. The F wave latencies for the median nerve on the left is normal, and is borderline normal for the left ulnar nerve, normal for the right median nerve. The F wave latency for the right ulnar nerve was normal.  EMG STUDIES:  EMG study was performed on the right upper extremity:  The first dorsal interosseous muscle reveals 2 to 4 K units with full recruitment. No fibrillations or positive waves were noted. The abductor pollicis brevis muscle reveals 2 to 4 K units with full recruitment. No fibrillations or positive waves were noted. The extensor indicis proprius muscle reveals 1 to 3 K units with full recruitment. No fibrillations or positive waves were noted. The pronator teres muscle reveals 1 to 3 K units with slightly decreased recruitment. 2+ positive waves were noted. The biceps muscle reveals 1 to 2 K units with full recruitment. 1+ positive waves were noted. The triceps muscle reveals 2 to 4 K units with full recruitment. No fibrillations or positive waves were noted. The anterior deltoid muscle reveals 2 to 3 K  units with full recruitment. No fibrillations or positive waves were noted. The cervical paraspinal muscles were tested at 2 levels. No abnormalities of insertional activity were seen at either level tested. There was good relaxation.   IMPRESSION:  Nerve conduction studies done on both upper extremities shows evidence of a mild right carpal tunnel syndrome. EMG evaluation of the right upper extremity shows some denervation isolated to the biceps muscle and pronator teres muscle, the possibility of an overlying C6 radiculopathy is considered, but cervical paraspinal muscles were within normal limits and a cervical radiculopathy therefore cannot be confirmed. The possibility of a low-grade brachial plexopathy needs to be considered.  Jill Alexanders MD 05/01/2017 3:03 PM  [facility] [address] [place], [place] [address]  Phone [phone] Fax [phone]

## 2017-05-01 NOTE — Progress Notes (Signed)
Please refer EMG and nerve conduction study procedure note. 

## 2017-05-02 ENCOUNTER — Telehealth (HOSPITAL_COMMUNITY): Payer: Self-pay | Admitting: Pharmacist

## 2017-05-02 NOTE — Telephone Encounter (Signed)
Cardiac Rehab Medication Review by a Pharmacist  Does the patient  feel that his/her medications are working for him/her?  yes  Has the patient been experiencing any side effects to the medications prescribed?  Yes, some hypotensive episodes.   Does the patient measure his/her own blood pressure or blood glucose at home?  Yes, patient has been having variable BP some hypotensive and some hypertensive. Seems to drop some days but not all days.   Does the patient have any problems obtaining medications due to transportation or finances?   Yes, lacosamide is expensive there is a plan with neurology to taper off of lacosamide and transition to levetiracetam and increase gabapentin.   Understanding of regimen: excellent Understanding of indications: excellent Potential of compliance: excellent    Pharmacist comments: I was granted permission to speak to Erik Salazar's wife verbally. She was well versed in his medication regimen and had no further concerns than those documented above. They are working with neurology to control tremors/ seizures with levetiracetam and gabapentin and weaning off of lacosamide as this is expensive on their insurance and are close to having to pay out of pocket. There have also been some episodes of hypotension, but she checks his BP every day and it is not frequent and they plan to address this with cardiology at the next appointment. It seemed that the wife was very knowledgeable about the medications and they both feel that the medications are appropriate and working at this time.     Erik Salazar, Pharm.D. PGY1 Pharmacy Resident 05/02/2017 5:51 PM Main Pharmacy: [phone]

## 2017-05-07 ENCOUNTER — Encounter (HOSPITAL_COMMUNITY): Payer: Self-pay

## 2017-05-07 ENCOUNTER — Encounter (HOSPITAL_COMMUNITY)
Admission: RE | Admit: 2017-05-07 | Discharge: 2017-05-07 | Disposition: A | Discharge status: Home / Self Care | Payer: Medicare HMO | Source: Ambulatory Visit | Attending: Cardiology | Admitting: Cardiology

## 2017-05-07 ENCOUNTER — Telehealth (HOSPITAL_COMMUNITY): Payer: Self-pay | Admitting: Cardiac Rehabilitation

## 2017-05-07 VITALS — BP 117/64 | HR 97 | Ht 68.0 in | Wt 175.3 lb

## 2017-05-07 DIAGNOSIS — G5601 Carpal tunnel syndrome, right upper limb: Secondary | ICD-10-CM | POA: Diagnosis not present

## 2017-05-07 DIAGNOSIS — Z7982 Long term (current) use of aspirin: Secondary | ICD-10-CM | POA: Insufficient documentation

## 2017-05-07 DIAGNOSIS — Z951 Presence of aortocoronary bypass graft: Secondary | ICD-10-CM | POA: Diagnosis not present

## 2017-05-07 DIAGNOSIS — G40909 Epilepsy, unspecified, not intractable, without status epilepticus: Secondary | ICD-10-CM | POA: Diagnosis not present

## 2017-05-07 DIAGNOSIS — Z79899 Other long term (current) drug therapy: Secondary | ICD-10-CM | POA: Insufficient documentation

## 2017-05-07 NOTE — Telephone Encounter (Signed)
-----   Message from Isaiah Serge, NP sent at 05/07/2017 10:41 AM EDT ----- Regarding: RE: cardiac rehab  Yes stop toprol for now.    ----- Message ----- From: Lowell Guitar, RN Sent: 05/07/2017  10:12 AM To: Isaiah Serge, NP Subject: cardiac rehab                                  Dear Mickel Baas,  Pt started cardiac rehab.  Upon evaluation, pt c/o dizziness and hypotension at home.  Reports home morning BP:  89-90s/50-60s associated with dizziness.  Pt presently taking Toprol XL 12.5 mg daily.  Should pt discontinue metoprolol?   BP:  117/64 at rehab today  Thank you, Andi Hence, RN, BSN Cardiac Pulmonary Rehab

## 2017-05-07 NOTE — Progress Notes (Signed)
Brazos Juma 66 y.o. male DOB: Sep 04, 1950 MRN: [id number]      Nutrition Note  Dx: s/p redo CABG x 1 at [facility]  Past Medical History:  Diagnosis Date  . Enlarged prostate   . H/O: knee surgery    15   . History of open heart surgery    ASCENDING AORTIC ANEURYSM  . 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 Seizure disorder ([place]) 04/05/2017  . Seizures ([place])   . Status post knee surgery    DVT POST KNEE SURGERY   Meds reviewed.  HT: Ht Readings from Last 1 Encounters:  04/23/17 5\' 10"  (1.778 m)    WT: Wt Readings from Last 3 Encounters:  04/23/17 166 lb 12.8 oz (75.7 kg)  04/05/17 162 lb (73.5 kg)  03/28/17 157 lb (71.2 kg)     BMI 23.9   Current tobacco use? No  Labs:  Lipid Panel     Component Value Date/Time   CHOL 199 12/13/2016 0942   TRIG 166 (H) 12/13/2016 0942   HDL 45 12/13/2016 0942   CHOLHDL 4.4 12/13/2016 0942   LDLCALC 121 (H) 12/13/2016 0942        LDL, direct  65      03/19/2017  No results found for: HGBA1C CBG (last 3)  No results for input(s): GLUCAP in the last 72 hours.  Nutrition Diagnosis Food-and nutrition-related knowledge deficit related to lack of exposure to information as related to diagnosis of: ? CVD  Plan:  Pt to attend nutrition classes ? Nutrition I ? Nutrition II ? Portion Distortion  Will provide client-centered nutrition education as part of interdisciplinary care.   Monitor and evaluate progress toward nutrition goal with team.  Derek Mound, M.Ed, RD, LDN, CDE 05/07/2017 9:01 AM

## 2017-05-07 NOTE — Progress Notes (Signed)
Cardiac Individual Treatment Plan  Patient Details  Name: Erik Salazar MRN: [id number] Date of Birth: 07/21/1951 Referring Provider:     CARDIAC REHAB PHASE II ORIENTATION from 05/07/2017 in [place]  Referring Provider  Candee Furbish MD      Initial Encounter Date:    CARDIAC REHAB PHASE II ORIENTATION from 05/07/2017 in [place]  Date  05/07/17  Referring Provider  Candee Furbish MD      Visit Diagnosis: S/P CABG x 1  Patient's Home Medications on Admission:  Current Outpatient Prescriptions:  .  amoxicillin (AMOXIL) 500 MG capsule, Take 2 grams (4 tablets) 1 hour prior to any dental procedures (Patient not taking: Reported on 05/02/2017), Disp: 4 capsule, Rfl: 0 .  aspirin 325 MG tablet, Take 325 mg by mouth daily., Disp: , Rfl:  .  finasteride (PROSCAR) 5 MG tablet, Take 5 mg by mouth daily., Disp: , Rfl:  .  gabapentin (NEURONTIN) 400 MG capsule, One capsules twice daily, 2 at night (Patient taking differently: 400 mg 2 (two) times daily. 400mg  BID, then 800mg  at bedtime), Disp: 360 capsule, Rfl: 1 .  lacosamide (VIMPAT) 50 MG TABS tablet, Begin 2 tablets twice a day for 4 weeks, then take one tablet twice a day for 4 weeks, then stop. (Patient taking differently: 50 mg 2 (two) times daily. ), Disp: 120 tablet, Rfl: 1 .  levETIRAcetam (KEPPRA) 750 MG tablet, Take 2 tablets (1,500 mg total) by mouth 2 (two) times daily., Disp: 120 tablet, Rfl: 5 .  metoprolol succinate (TOPROL-XL) 25 MG 24 hr tablet, Take 0.5 tablets (12.5 mg total) by mouth daily. Take with or immediately following a meal., Disp: 45 tablet, Rfl: 3 .  pravastatin (PRAVACHOL) 40 MG tablet, Take 40 mg by mouth every evening., Disp: , Rfl:  .  tamsulosin (FLOMAX) 0.4 MG CAPS capsule, Take 0.4 mg by mouth every evening. , Disp: , Rfl:   Past Medical History: Past Medical History:  Diagnosis Date  . Enlarged prostate   . H/O: knee surgery    15   .  History of open heart surgery    ASCENDING AORTIC ANEURYSM  . 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 Seizure disorder ([place]) 04/05/2017  . Seizures ([place])   . Status post knee surgery    DVT POST KNEE SURGERY    Tobacco Use: History  Smoking Status  . Never Smoker  Smokeless Tobacco  . Never Used    Labs: Recent Review Flowsheet Data    Labs for ITP Cardiac and Pulmonary Rehab Latest Ref Rng & Units 12/13/2016   Cholestrol 100 - 199 mg/dL 199   LDLCALC 0 - 99 mg/dL 121(H)   HDL >39 mg/dL 45   Trlycerides 0 - 149 mg/dL 166(H)      Capillary Blood Glucose: No results found for: GLUCAP   Exercise Target Goals: Date: 05/07/17  Exercise Program Goal: Individual exercise prescription set with THRR, safety & activity barriers. Participant demonstrates ability to understand and report RPE using BORG scale, to self-measure pulse accurately, and to acknowledge the importance of the exercise prescription.  Exercise Prescription Goal: Starting with aerobic activity 30 plus minutes a day, 3 days per week for initial exercise prescription. Provide home exercise prescription and guidelines that participant acknowledges understanding prior to discharge.  Activity Barriers & Risk Stratification:     Activity Barriers & Cardiac Risk Stratification - 05/07/17 0957  Activity Barriers & Cardiac Risk Stratification   Activity Barriers Other (comment);Joint Problems;Deconditioning;Muscular Weakness   Comments hand complications from recent CVA, chronic knee pain, s/p TKR 2015 (L); seizures   Cardiac Risk Stratification High      6 Minute Walk:     6 Minute Walk    Row Name 05/07/17 0953         6 Minute Walk   Phase Initial     Distance 1560 feet     Walk Time 6 minutes     # of Rest Breaks 0     MPH 2.95     METS 3.82     RPE 11     VO2 Peak 13.4     Symptoms Yes (comment)     Comments 2/10 knee pain     Resting HR 97 bpm     Resting BP  117/64     Resting Oxygen Saturation  97 %     Exercise Oxygen Saturation  during 6 min walk 100 %     Max Ex. HR 126 bpm     Max Ex. BP 124/67     2 Minute Post BP 125/94        Oxygen Initial Assessment:   Oxygen Re-Evaluation:   Oxygen Discharge (Final Oxygen Re-Evaluation):   Initial Exercise Prescription:     Initial Exercise Prescription - 05/07/17 0900      Date of Initial Exercise RX and Referring Provider   Date 05/07/17   Referring Provider Candee Furbish MD     Bike   Level 2  upright scifit   Watts 30   Minutes 10   METs 3.18     NuStep   Level 3   SPM 80   Minutes 10   METs 2.5     Track   Laps 11   Minutes 10   METs 2.92     Prescription Details   Frequency (times per week) 3   Duration Progress to 30 minutes of continuous aerobic without signs/symptoms of physical distress     Intensity   THRR 40-80% of Max Heartrate 62-123   Ratings of Perceived Exertion 11-13   Perceived Dyspnea 0-4     Progression   Progression Continue to progress workloads to maintain intensity without signs/symptoms of physical distress.     Resistance Training   Training Prescription Yes   Weight 2lbs   Reps 10-15      Perform Capillary Blood Glucose checks as needed.  Exercise Prescription Changes:   Exercise Comments:   Exercise Goals and Review:     Exercise Goals    Row Name 05/07/17 1011             Exercise Goals   Increase Physical Activity Yes       Intervention Provide advice, education, support and counseling about physical activity/exercise needs.;Develop an individualized exercise prescription for aerobic and resistive training based on initial evaluation findings, risk stratification, comorbidities and participant's personal goals.       Expected Outcomes Achievement of increased cardiorespiratory fitness and enhanced flexibility, muscular endurance and strength shown through measurements of functional capacity and personal statement  of participant.       Increase Strength and Stamina Yes       Intervention Provide advice, education, support and counseling about physical activity/exercise needs.;Develop an individualized exercise prescription for aerobic and resistive training based on initial evaluation findings, risk stratification, comorbidities and participant's personal goals.  Expected Outcomes Achievement of increased cardiorespiratory fitness and enhanced flexibility, muscular endurance and strength shown through measurements of functional capacity and personal statement of participant.       Able to understand and use rate of perceived exertion (RPE) scale Yes       Intervention Provide education and explanation on how to use RPE scale       Expected Outcomes Short Term: Able to use RPE daily in rehab to express subjective intensity level;Long Term:  Able to use RPE to guide intensity level when exercising independently       Knowledge and understanding of Target Heart Rate Range (THRR) Yes       Intervention Provide education and explanation of THRR including how the numbers were predicted and where they are located for reference       Expected Outcomes Short Term: Able to state/look up THRR;Long Term: Able to use THRR to govern intensity when exercising independently;Short Term: Able to use daily as guideline for intensity in rehab       Able to check pulse independently Yes       Intervention Provide education and demonstration on how to check pulse in carotid and radial arteries.;Review the importance of being able to check your own pulse for safety during independent exercise       Expected Outcomes Short Term: Able to explain why pulse checking is important during independent exercise;Long Term: Able to check pulse independently and accurately       Understanding of Exercise Prescription Yes       Intervention Provide education, explanation, and written materials on patient's individual exercise prescription        Expected Outcomes Short Term: Able to explain program exercise prescription;Long Term: Able to explain home exercise prescription to exercise independently          Exercise Goals Re-Evaluation :    Discharge Exercise Prescription (Final Exercise Prescription Changes):   Nutrition:  Target Goals: Understanding of nutrition guidelines, daily intake of sodium 1500mg , cholesterol 200mg , calories 30% from fat and 7% or less from saturated fats, daily to have 5 or more servings of fruits and vegetables.  Biometrics:     Pre Biometrics - 05/07/17 1011      Pre Biometrics   Waist Circumference 36 inches   Hip Circumference 38.75 inches   Waist to Hip Ratio 0.93 %   Triceps Skinfold 10 mm   % Body Fat 26.4 %   Grip Strength 29.5 kg   Flexibility 15.5 in   Single Leg Stand 15.21 seconds       Nutrition Therapy Plan and Nutrition Goals:   Nutrition Discharge: Nutrition Scores:   Nutrition Goals Re-Evaluation:   Nutrition Goals Re-Evaluation:   Nutrition Goals Discharge (Final Nutrition Goals Re-Evaluation):   Psychosocial: Target Goals: Acknowledge presence or absence of significant depression and/or stress, maximize coping skills, provide positive support system. Participant is able to verbalize types and ability to use techniques and skills needed for reducing stress and depression.  Initial Review & Psychosocial Screening:     Initial Psych Review & Screening - 05/07/17 0849      Initial Review   Current issues with Current Stress Concerns;Current Anxiety/Panic  health related anxiety   Source of Stress Concerns Chronic Illness;Unable to perform yard/household activities;Unable to participate in former interests or hobbies   Comments pt s/p redo CABG and CVA with deficits.       Family Dynamics   Good Support System? Yes  spouse, family,  friends      Barriers   Psychosocial barriers to participate in program The patient should benefit from training in  stress management and relaxation.     Screening Interventions   Interventions Encouraged to exercise      Quality of Life Scores:     Quality of Life - 05/07/17 0851      Quality of Life Scores   Health/Function Pre 20 %   Socioeconomic Pre 27.7 %   Psych/Spiritual Pre 24.86 %   Family Pre 25.2 %   GLOBAL Pre 23.49 %      PHQ-9: Recent Review Flowsheet Data    There is no flowsheet data to display.     Interpretation of Total Score  Total Score Depression Severity:  1-4 = Minimal depression, 5-9 = Mild depression, 10-14 = Moderate depression, 15-19 = Moderately severe depression, 20-27 = Severe depression   Psychosocial Evaluation and Intervention:   Psychosocial Re-Evaluation:   Psychosocial Discharge (Final Psychosocial Re-Evaluation):   Vocational Rehabilitation: Provide vocational rehab assistance to qualifying candidates.   Vocational Rehab Evaluation & Intervention:     Vocational Rehab - 05/07/17 0848      Initial Vocational Rehab Evaluation & Intervention   Assessment shows need for Vocational Rehabilitation No  retired       Education: Education Goals: Education classes will be provided on a weekly basis, covering required topics. Participant will state understanding/return demonstration of topics presented.  Learning Barriers/Preferences:     Learning Barriers/Preferences - 05/07/17 1014      Learning Barriers/Preferences   Learning Barriers Sight   Learning Preferences Written Material;Video;Verbal Instruction;Skilled Demonstration;Pictoral      Education Topics: Count Your Pulse:  -Group instruction provided by verbal instruction, demonstration, patient participation and written materials to support subject.  Instructors address importance of being able to find your pulse and how to count your pulse when at home without a heart monitor.  Patients get hands on experience counting their pulse with staff help and individually.   Heart  Attack, Angina, and Risk Factor Modification:  -Group instruction provided by verbal instruction, video, and written materials to support subject.  Instructors address signs and symptoms of angina and heart attacks.    Also discuss risk factors for heart disease and how to make changes to improve heart health risk factors.   Functional Fitness:  -Group instruction provided by verbal instruction, demonstration, patient participation, and written materials to support subject.  Instructors address safety measures for doing things around the house.  Discuss how to get up and down off the floor, how to pick things up properly, how to safely get out of a chair without assistance, and balance training.   Meditation and Mindfulness:  -Group instruction provided by verbal instruction, patient participation, and written materials to support subject.  Instructor addresses importance of mindfulness and meditation practice to help reduce stress and improve awareness.  Instructor also leads participants through a meditation exercise.    Stretching for Flexibility and Mobility:  -Group instruction provided by verbal instruction, patient participation, and written materials to support subject.  Instructors lead participants through series of stretches that are designed to increase flexibility thus improving mobility.  These stretches are additional exercise for major muscle groups that are typically performed during regular warm up and cool down.   Hands Only CPR:  -Group verbal, video, and participation provides a basic overview of AHA guidelines for community CPR. Role-play of emergencies allow participants the opportunity to practice calling for help and  chest compression technique with discussion of AED use.   Hypertension: -Group verbal and written instruction that provides a basic overview of hypertension including the most recent diagnostic guidelines, risk factor reduction with self-care instructions  and medication management.    Nutrition I class: Heart Healthy Eating:  -Group instruction provided by PowerPoint slides, verbal discussion, and written materials to support subject matter. The instructor gives an explanation and review of the Therapeutic Lifestyle Changes diet recommendations, which includes a discussion on lipid goals, dietary fat, sodium, fiber, plant stanol/sterol esters, sugar, and the components of a well-balanced, healthy diet.   Nutrition II class: Lifestyle Skills:  -Group instruction provided by PowerPoint slides, verbal discussion, and written materials to support subject matter. The instructor gives an explanation and review of label reading, grocery shopping for heart health, heart healthy recipe modifications, and ways to make healthier choices when eating out.   Diabetes Question & Answer:  -Group instruction provided by PowerPoint slides, verbal discussion, and written materials to support subject matter. The instructor gives an explanation and review of diabetes co-morbidities, pre- and post-prandial blood glucose goals, pre-exercise blood glucose goals, signs, symptoms, and treatment of hypoglycemia and hyperglycemia, and foot care basics.   Diabetes Blitz:  -Group instruction provided by PowerPoint slides, verbal discussion, and written materials to support subject matter. The instructor gives an explanation and review of the physiology behind type 1 and type 2 diabetes, diabetes medications and rational behind using different medications, pre- and post-prandial blood glucose recommendations and Hemoglobin A1c goals, diabetes diet, and exercise including blood glucose guidelines for exercising safely.    Portion Distortion:  -Group instruction provided by PowerPoint slides, verbal discussion, written materials, and food models to support subject matter. The instructor gives an explanation of serving size versus portion size, changes in portions sizes over the  last 20 years, and what consists of a serving from each food group.   Stress Management:  -Group instruction provided by verbal instruction, video, and written materials to support subject matter.  Instructors review role of stress in heart disease and how to cope with stress positively.     Exercising on Your Own:  -Group instruction provided by verbal instruction, power point, and written materials to support subject.  Instructors discuss benefits of exercise, components of exercise, frequency and intensity of exercise, and end points for exercise.  Also discuss use of nitroglycerin and activating EMS.  Review options of places to exercise outside of rehab.  Review guidelines for sex with heart disease.   Cardiac Drugs I:  -Group instruction provided by verbal instruction and written materials to support subject.  Instructor reviews cardiac drug classes: antiplatelets, anticoagulants, beta blockers, and statins.  Instructor discusses reasons, side effects, and lifestyle considerations for each drug class.   Cardiac Drugs II:  -Group instruction provided by verbal instruction and written materials to support subject.  Instructor reviews cardiac drug classes: angiotensin converting enzyme inhibitors (ACE-I), angiotensin II receptor blockers (ARBs), nitrates, and calcium channel blockers.  Instructor discusses reasons, side effects, and lifestyle considerations for each drug class.   Anatomy and Physiology of the Circulatory System:  Group verbal and written instruction and models provide basic cardiac anatomy and physiology, with the coronary electrical and arterial systems. Review of: AMI, Angina, Valve disease, Heart Failure, Peripheral Artery Disease, Cardiac Arrhythmia, Pacemakers, and the ICD.   Other Education:  -Group or individual verbal, written, or video instructions that support the educational goals of the cardiac rehab program.   Knowledge Questionnaire Score:  Knowledge  Questionnaire Score - 05/07/17 0848      Knowledge Questionnaire Score   Pre Score 23/24      Core Components/Risk Factors/Patient Goals at Admission:     Personal Goals and Risk Factors at Admission - 05/07/17 1031      Core Components/Risk Factors/Patient Goals on Admission   Admit Weight 175 lb 4.3 oz (79.5 kg)      Core Components/Risk Factors/Patient Goals Review:    Core Components/Risk Factors/Patient Goals at Discharge (Final Review):    ITP Comments:     ITP Comments    Row Name 05/07/17 0750           ITP Comments Dr. Fransico Him, Medical Director           Comments: Patient attended orientation from 0720  to [phone] to review rules and guidelines for program. Completed 6 minute walk test, Intitial ITP, and exercise prescription.  VSS. Telemetry-sinus rhythm.  Asymptomatic.

## 2017-05-08 ENCOUNTER — Telehealth (HOSPITAL_COMMUNITY): Payer: Self-pay | Admitting: Cardiac Rehabilitation

## 2017-05-08 NOTE — Telephone Encounter (Signed)
-----   Message from Isaiah Serge, NP sent at 05/07/2017 10:41 AM EDT ----- Regarding: RE: cardiac rehab  Yes stop toprol for now.    ----- Message ----- From: Lowell Guitar, RN Sent: 05/07/2017  10:12 AM To: Isaiah Serge, NP Subject: cardiac rehab                                  Dear Mickel Baas,  Pt started cardiac rehab.  Upon evaluation, pt c/o dizziness and hypotension at home.  Reports home morning BP:  89-90s/50-60s associated with dizziness.  Pt presently taking Toprol XL 12.5 mg daily.  Should pt discontinue metoprolol?   BP:  117/64 at rehab today  Thank you, Andi Hence, RN, BSN Cardiac Pulmonary Rehab

## 2017-05-08 NOTE — Telephone Encounter (Signed)
Pt informed to hold Toprol XL per Cecilie Kicks, NP. Pt verbalized understanding.

## 2017-05-09 ENCOUNTER — Telehealth: Payer: Self-pay | Admitting: Neurology

## 2017-05-09 NOTE — Telephone Encounter (Signed)
-----   Message from Lowell Guitar, RN sent at 05/07/2017  9:52 AM EDT ----- Regarding: cardiac rehab  Dear Dr. [name],  Pt has started cardiac rehab. His is c/p CVA following 03/13/2017 CABG.   Upon entrance evaluation, pt c/o hand weakness, numbness and tremors with right shoulder numbness/weakness.   Would occupational therapy and/or physical therapy assist with these symptoms?  Pt did not have therapy following his CVA.  Pt is interested in therapy if it would be beneficial.   Thank you, Andi Hence, RN, BSN Cardiac Pulmonary Rehab

## 2017-05-09 NOTE — Telephone Encounter (Signed)
I called the patient. The cardiac rehabilitation contact her office, the patient is having some problems with use of the hands, we will consider occupational therapy evaluation if the pain level in the right arm is down to the point where he believes that he can participate with the therapy.  The patient is to contact our office if he is amenable to having occupational therapy ordered.

## 2017-05-10 ENCOUNTER — Telehealth (HOSPITAL_COMMUNITY): Payer: Self-pay | Admitting: Cardiac Rehabilitation

## 2017-05-10 NOTE — Telephone Encounter (Signed)
-----   Message from Kathrynn Ducking, MD sent at 05/09/2017  8:27 AM EDT ----- Regarding: RE: cardiac rehab  I have called the patient left a message, if the pain level in the right arm is down enough, and he believes that he can participate in occupational therapy, I will order this for him.  He is to contact our office if this is the case. ----- Message ----- From: Lowell Guitar, RN Sent: 05/07/2017   9:52 AM To: Kathrynn Ducking, MD Subject: cardiac rehab                                  Dear Dr. [name],  Pt has started cardiac rehab. His is c/p CVA following 03/13/2017 CABG.   Upon entrance evaluation, pt c/o hand weakness, numbness and tremors with right shoulder numbness/weakness.   Would occupational therapy and/or physical therapy assist with these symptoms?  Pt did not have therapy following his CVA.  Pt is interested in therapy if it would be beneficial.   Thank you, Andi Hence, RN, BSN Cardiac Pulmonary Rehab

## 2017-05-13 ENCOUNTER — Encounter (HOSPITAL_COMMUNITY): Payer: Self-pay

## 2017-05-13 ENCOUNTER — Encounter (HOSPITAL_COMMUNITY)
Admission: RE | Admit: 2017-05-13 | Discharge: 2017-05-13 | Disposition: A | Discharge status: Home / Self Care | Payer: Medicare HMO | Source: Ambulatory Visit | Attending: Cardiology | Admitting: Cardiology

## 2017-05-13 DIAGNOSIS — Z951 Presence of aortocoronary bypass graft: Secondary | ICD-10-CM

## 2017-05-13 DIAGNOSIS — Z79899 Other long term (current) drug therapy: Secondary | ICD-10-CM | POA: Diagnosis not present

## 2017-05-13 DIAGNOSIS — Z7982 Long term (current) use of aspirin: Secondary | ICD-10-CM | POA: Diagnosis not present

## 2017-05-13 DIAGNOSIS — G40909 Epilepsy, unspecified, not intractable, without status epilepticus: Secondary | ICD-10-CM | POA: Diagnosis not present

## 2017-05-13 NOTE — Progress Notes (Signed)
Daily Session Note  Patient Details  Name: Erik Salazar MRN: [id number] Date of Birth: 08-24-1950 Referring Provider:     CARDIAC REHAB PHASE II ORIENTATION from 05/07/2017 in [place]  Referring Provider  Candee Furbish MD      Encounter Date: 05/13/2017  Check In:     Session Check In - 05/13/17 0940      Check-In   Location [facility]   Staff Present Andi Hence, RN, Deland Pretty, MS, ACSM CEP, Exercise Physiologist;Maria Whitaker, RN, BSN;Amber Fair, MS, ACSM RCEP, Exercise Physiologist   Supervising physician immediately available to respond to emergencies [facility] immediately available   Physician(s) Dr. Wynelle Cleveland   Medication changes reported     No   Fall or balance concerns reported    No   Tobacco Cessation No Change   Warm-up and Cool-down Performed as group-led instruction   Resistance Training Performed Yes   VAD Patient? No     Pain Assessment   Currently in Pain? No/denies   Multiple Pain Sites No      Capillary Blood Glucose: No results found for this or any previous visit (from the past 24 hour(s)).    History  Smoking Status  . Never Smoker  Smokeless Tobacco  . Never Used    Goals Met:  Exercise tolerated well  Goals Unmet:  Not Applicable  Comments: Pt started cardiac rehab today.  Pt tolerated light exercise without difficulty. VSS, telemetry-sinus rhythm, asymptomatic.  Medication list reconciled. Pt denies barriers to medicaiton compliance.  PSYCHOSOCIAL ASSESSMENT:  PHQ-0. Pt exhibits positive coping skills, hopeful outlook with supportive family. No psychosocial needs identified at this time, no psychosocial interventions necessary.    Pt enjoys watching sports, favorite team is [place].  Pt also enjoys walking. Pt previously enjoyed farming and yardwork however currently unable to participate.  Pt goals for cardiac rehab are to increase muscle tone..   Pt oriented to exercise  equipment and routine.    Understanding verbalized.   Dr. Fransico Him is Medical Director for Cardiac Rehab at [facility]

## 2017-05-14 ENCOUNTER — Telehealth: Payer: Self-pay | Admitting: Cardiology

## 2017-05-14 NOTE — Telephone Encounter (Signed)
Patient states that he had a missed phone call from our office at 10:00 AM and the person did not leave a message. There is no record of this phone call and the patient has not had any recent testing. Patient denies any complaints at this time.

## 2017-05-14 NOTE — Telephone Encounter (Signed)
New Message  Pt wife call stating she was returning RN call. Please call back to discuss

## 2017-05-15 ENCOUNTER — Encounter (HOSPITAL_COMMUNITY)
Admission: RE | Admit: 2017-05-15 | Discharge: 2017-05-15 | Disposition: A | Discharge status: Home / Self Care | Payer: Medicare HMO | Source: Ambulatory Visit | Attending: Cardiology | Admitting: Cardiology

## 2017-05-15 DIAGNOSIS — Z79899 Other long term (current) drug therapy: Secondary | ICD-10-CM | POA: Diagnosis not present

## 2017-05-15 DIAGNOSIS — Z951 Presence of aortocoronary bypass graft: Secondary | ICD-10-CM | POA: Diagnosis not present

## 2017-05-15 DIAGNOSIS — G40909 Epilepsy, unspecified, not intractable, without status epilepticus: Secondary | ICD-10-CM | POA: Diagnosis not present

## 2017-05-15 DIAGNOSIS — Z7982 Long term (current) use of aspirin: Secondary | ICD-10-CM | POA: Diagnosis not present

## 2017-05-15 NOTE — Progress Notes (Signed)
Erik Salazar 66 y.o. male DOB: 1950/10/05 MRN: [id number]      Nutrition Note  Dx: s/p redo CABG x 1 at [facility] Note Spoke with pt. Nutrition Plan and Nutrition Survey goals reviewed with pt. Pt is following Step 2 of the Therapeutic Lifestyle Changes diet. Pt wants to lose wt "especially around the middle." Pt with muscle wasting due to 2 open heart surgeries in a year. Pt expressed understanding of the information reviewed. Pt aware of nutrition education classes offered and plans on attending nutrition classes.  Nutrition Diagnosis Food-and nutrition-related knowledge deficit related to lack of exposure to information as related to diagnosis of: ? CVD  Nutrition Intervention ?  Pt's individual nutrition plan reviewed with pt. ? Benefits of adopting Therapeutic Lifestyle Changes discussed when Medficts reviewed.    Goal(s) ? Pt to identify food quantities necessary to achieve weight loss of 6-15 lb at graduation from cardiac rehab.   Plan:  Pt to attend nutrition classes ? Nutrition I ? Nutrition II ? Portion Distortion  Will provide client-centered nutrition education as part of interdisciplinary care.   Monitor and evaluate progress toward nutrition goal with team.  Derek Mound, M.Ed, RD, LDN, CDE 05/15/2017 10:18 AM

## 2017-05-16 ENCOUNTER — Ambulatory Visit
Admission: RE | Admit: 2017-05-16 | Discharge: 2017-05-16 | Disposition: A | Discharge status: Home / Self Care | Payer: Medicare HMO | Source: Ambulatory Visit | Attending: Neurology | Admitting: Neurology

## 2017-05-16 DIAGNOSIS — M4802 Spinal stenosis, cervical region: Secondary | ICD-10-CM | POA: Diagnosis not present

## 2017-05-16 DIAGNOSIS — M79601 Pain in right arm: Secondary | ICD-10-CM | POA: Diagnosis not present

## 2017-05-17 ENCOUNTER — Telehealth: Payer: Self-pay | Admitting: Neurology

## 2017-05-17 ENCOUNTER — Encounter (HOSPITAL_COMMUNITY)
Admission: RE | Admit: 2017-05-17 | Discharge: 2017-05-17 | Disposition: A | Discharge status: Home / Self Care | Payer: Medicare HMO | Source: Ambulatory Visit | Attending: Cardiology | Admitting: Cardiology

## 2017-05-17 DIAGNOSIS — G40909 Epilepsy, unspecified, not intractable, without status epilepticus: Secondary | ICD-10-CM | POA: Diagnosis not present

## 2017-05-17 DIAGNOSIS — Z79899 Other long term (current) drug therapy: Secondary | ICD-10-CM | POA: Diagnosis not present

## 2017-05-17 DIAGNOSIS — Z7982 Long term (current) use of aspirin: Secondary | ICD-10-CM | POA: Diagnosis not present

## 2017-05-17 DIAGNOSIS — Z951 Presence of aortocoronary bypass graft: Secondary | ICD-10-CM | POA: Diagnosis not present

## 2017-05-17 NOTE — Telephone Encounter (Signed)
I called patient. The MRI of the cervical spine does show some spondylitic changes at C5-6 level, with possible impingement of the C6 nerve roots on both sides, this does correlate well with the EMG finding showing evidence of denervation in some of the C6 innervated muscles of the right arm.  The patient believes that he is improving some of his pain, we will continue to watch the patient conservatively, may consider an epidural steroid injection in the neck in the future.   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

## 2017-05-20 ENCOUNTER — Ambulatory Visit (INDEPENDENT_AMBULATORY_CARE_PROVIDER_SITE_OTHER): Payer: Medicare HMO | Admitting: Cardiology

## 2017-05-20 ENCOUNTER — Encounter: Payer: Self-pay | Admitting: Cardiology

## 2017-05-20 ENCOUNTER — Encounter (HOSPITAL_COMMUNITY)
Admission: RE | Admit: 2017-05-20 | Discharge: 2017-05-20 | Disposition: A | Discharge status: Home / Self Care | Payer: Medicare HMO | Source: Ambulatory Visit | Attending: Cardiology | Admitting: Cardiology

## 2017-05-20 VITALS — BP 128/82 | HR 78 | Ht 70.0 in | Wt 168.4 lb

## 2017-05-20 DIAGNOSIS — Z951 Presence of aortocoronary bypass graft: Secondary | ICD-10-CM

## 2017-05-20 DIAGNOSIS — Z7982 Long term (current) use of aspirin: Secondary | ICD-10-CM | POA: Diagnosis not present

## 2017-05-20 DIAGNOSIS — G40909 Epilepsy, unspecified, not intractable, without status epilepticus: Secondary | ICD-10-CM | POA: Diagnosis not present

## 2017-05-20 DIAGNOSIS — I959 Hypotension, unspecified: Secondary | ICD-10-CM | POA: Diagnosis not present

## 2017-05-20 DIAGNOSIS — Z79899 Other long term (current) drug therapy: Secondary | ICD-10-CM | POA: Diagnosis not present

## 2017-05-20 DIAGNOSIS — Z8679 Personal history of other diseases of the circulatory system: Secondary | ICD-10-CM

## 2017-05-20 DIAGNOSIS — I729 Aneurysm of unspecified site: Secondary | ICD-10-CM | POA: Diagnosis not present

## 2017-05-20 DIAGNOSIS — Z9889 Other specified postprocedural states: Secondary | ICD-10-CM

## 2017-05-20 NOTE — Patient Instructions (Signed)

## 2017-05-20 NOTE — Progress Notes (Signed)
Cardiology Office Note:    Date:  05/20/2017   ID:  Erik Salazar, DOB May 21, 1951, MRN [id number]  PCP:  [name]  Cardiologist:  Candee Furbish, MD    Referring MD: [name]     History of Present Illness:    Erik Salazar is a 66 y.o. male with a hx of Aortic aneurysm repair here to establish care. In May 2017 he was found to have an ascending aortic aneurysm and had repair in [place]. He stated that they "repaired 2 valves "at the same time. He had a small hernia at the base of his incision that was being monitored. He was told to wait a full year before having this fixed by a Psychologist, sport and exercise.   He then had a sterile abscess removal from Dr. Payton Mccallum with omental flap around aorta and CABG 1 LIMA to LAD on 03/13/17. Dr. Prescott Gum recommended repair. They had second opinion at [facility] and Had procedure there with Dr. Ysidro Evert. He had a periprocedural bifrontal lobe stroke. Dr. [name] season. He is on antiseizure medication. He is just starting cardiac rehabilitation. Doing well.  One-year prior to his surgery he had a documented 4 cm enlargement of his ascending aorta. He then underwent a another CAT scan of his chest which showed an aneurysm as large as 5.3 cm. There was no evidence of dissection no hematoma. He cardiac catheterization that showed no significant coronary artery disease. The aneurysm was located in the ascending aorta just above the sinotubular ridge, saccular.  He underwent placement of his ascending aorta and aortic valve. Bentall procedure. Bioprosthetic aortic valve #23 mm bovine model #2700 TF ask, and 28 mm Gelweave woven vascular sinus of Valsalva graft. He had reimplantation of the left and right coronary ostia. Coronary artery bypass grafting 2 also utilizing the SVG from aorta to LAD and circumflex artery were placed. Left leg vein. This was performed on 01/04/16 by Dr. Matthias Hughs at [facility] in [place].  He's had 14 total knee surgeries since 1983 and a knee replacement in 2015. He had a DVT post knee surgery.  He's never smoked. Walks up to 1-2 miles a day. Stuggles afterwards. Sometimes walking into the wind hard to breath. Was very active before this.   He has been compliant with his medications.  Has esophageal dil x 3. 150 sit ups prior. Retired Control and instrumentation engineer in jail.   Feels weak, lightheaded, dizzy, weak. Sometimes twinges.   BP at home 115-120. Once at [facility] 105.   Overall had been quite frustrated with his overall health.  Past Medical History:  Diagnosis Date  . Enlarged prostate   . H/O: knee surgery    15   . History of open heart surgery    ASCENDING AORTIC ANEURYSM  . Ischemic stroke of frontal lobe ([place]) 04/05/2017   Bilateral  . Seizure disorder ([place]) 04/05/2017  . Seizures ([place])   . Status post knee surgery    DVT POST KNEE SURGERY    Past Surgical History:  Procedure Laterality Date  . CARDIAC SURGERY     ANUERSYM MAY 2017   Bentall procedure. 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 CORONARY ARTERY BYPASS GRAFT  03/13/2017   LIMA to LAD with steril abcess removal from dacron  graft  . FALSE ANEURYSM REPAIR  03/13/2017   at [facility]   . KNEE SURGERY  1983  . open heart surgery     03-13-2017  . REPLACEMENT TOTAL KNEE  2015  . ROTATOR CUFF REPAIR      Current Medications: Current Meds  Medication Sig  . amoxicillin (AMOXIL) 500 MG capsule Take 2 grams (4 tablets) 1 hour prior to any dental procedures  . aspirin 325 MG tablet Take 325 mg by mouth daily.  . finasteride (PROSCAR) 5 MG tablet Take 5 mg by mouth daily.  Marland Kitchen gabapentin (NEURONTIN) 400 MG capsule Take 400 mg by mouth. Patient takes 400mg  by mouth in the morning,400 mg by mouth in the afternoon, and 400-800 by mouth in the evening.  .  lacosamide (VIMPAT) 50 MG TABS tablet Begin 2 tablets twice a day for 4 weeks, then take one tablet twice a day for 4 weeks, then stop. (Patient taking differently: 50 mg 2 (two) times daily. )  . levETIRAcetam (KEPPRA) 750 MG tablet Take 2 tablets (1,500 mg total) by mouth 2 (two) times daily.  . pravastatin (PRAVACHOL) 40 MG tablet Take 40 mg by mouth every evening.  . tamsulosin (FLOMAX) 0.4 MG CAPS capsule Take 0.4 mg by mouth every evening.   . [DISCONTINUED] gabapentin (NEURONTIN) 400 MG capsule One capsules twice daily, 2 at night (Patient taking differently: 400 mg 2 (two) times daily. 400mg  BID, then 800mg  at bedtime)     Allergies:   Oxycodone hcl; Oxycontin [oxycodone]; Percocet [oxycodone-acetaminophen]; and Sulfur   Social History   Social History  . Marital status: Married    Spouse name: N/A  . Number of children: N/A  . Years of education: N/A   Occupational History  . RETIRED    Social History Main Topics  . Smoking status: Never Smoker  . Smokeless tobacco: Never Used  . Alcohol use No  . Drug use: No  . Sexual activity: Yes   Other Topics Concern  . None   Social History Narrative   Lives at home with wife, is retired.  Education: 2 yrs college.   2 Children.     family history includes Aneurysm in his mother. Other history of brain aneurysms on his side of the family  ROS:   Please see the history of present illness.     All other systems reviewed and are negative.   EKGs/Labs/Other Studies Reviewed:    EKG:  EKG is  ordered today.  The ekg ordered today demonstrates 12/13/16-sinus rhythm 87 with incomplete right bundle branch block personally viewed.  Recent Labs: 12/13/2016: ALT 13; TSH 1.060 03/28/2017: BUN 18; Creatinine, Ser 0.99; Hemoglobin 11.9; Platelets 418; Potassium 4.4; Sodium 132   Recent Lipid Panel    Component Value Date/Time   CHOL 199 12/13/2016 0942   TRIG 166 (H) 12/13/2016 0942   HDL 45 12/13/2016 0942   CHOLHDL 4.4  12/13/2016 0942   LDLCALC 121 (H) 12/13/2016 0942    Physical Exam:    VS:  BP 128/82   Pulse 78   Ht 5\' 10"  (1.778 m)   Wt 168 lb 6.4 oz (76.4 kg)   BMI 24.16 kg/m     Wt Readings from Last 3 Encounters:  05/20/17 168 lb 6.4 oz (76.4 kg)  05/07/17 175 lb 4.3 oz (79.5 kg)  04/23/17 166 lb 12.8 oz (75.7 kg)     GEN: Well nourished, well developed, in no acute distress  HEENT: normal  Neck: no JVD,  carotid bruits, or masses Cardiac: RRR; no murmurs, rubs, or gallops,no edema , CABG scar noted Respiratory:  clear to auscultation bilaterally, normal work of breathing GI: soft, nontender, nondistended, + BS MS: no deformity or atrophy , wearing right splint Skin: warm and dry, no rash Neuro:  Alert and Oriented x 3, Strength and sensation are intact Psych: euthymic mood, full affect   Echo 12/13/16  Study Conclusions  - Left ventricle: The cavity size was normal. There was mild concentric hypertrophy. Systolic function was normal. The estimated ejection fraction was in the range of 60% to 65%. Wall motion was normal; there were no regional wall motion abnormalities. Features are consistent with a pseudonormal left ventricular filling pattern, with concomitant abnormal relaxation and increased filling pressure (grade 2 diastolic dysfunction). Doppler parameters are consistent with high ventricular filling pressure. - Aortic valve: A bioprosthesis was present and functioning normally. Transvalvular velocity was within the normal range. There was no stenosis. There was no regurgitation. Valve area (VTI): 2.62 cm^2. Valve area (Vmax): 2.49 cm^2. Valve area (Vmean): 2.69 cm^2. - Mitral valve: Transvalvular velocity was within the normal range. There was no evidence for stenosis. There was trivial regurgitation. - Right ventricle: The cavity size was normal. Wall thickness was normal. Systolic function was normal. - Atrial septum: No defect or  patent foramen ovale was identified. - Tricuspid valve: There was mild regurgitation. - Pulmonary arteries: Systolic pressure was within the normal range. PA peak pressure: 29 mm Hg (S).   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 History of aortic root repair   2. S/P CABG x 2   3. H/O aortic valve repair   4. 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 Hypotension, unspecified hypotension type    PLAN:    In order of problems listed above:  Ascending aortic aneurysm repair status post Bentall procedure and bioprosthetic aortic valve replacementWith redo sternotomy secondary to a false aneurysm noted at the distal suture line.   - Dental prophylaxis  - Aspirin 325  - Weakness/imbalance/hypotension-12.5 mg of Toprol-XL was being utilized.  CAD  - LIMA to LAD on 03/13/17/redo sternotomy with sterile abscess removal, Dacron graft  - SVG from aorta to LAD and SVG to circumflex previously on 01/04/16 with SVG to mid LAD 90% stenosis and subsequent LIMA to LAD placed. SVG to OM 2 was patent.  Dizziness/hypotension  - Improved after stopping Toprol.  - Continue to encourage hydration.  Hyperlipidemia  - Continue with pravastatin. Lipids are being checked by Dr. 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   Medication Adjustments/Labs and Tests Ordered: Current medicines are reviewed at length with the patient today.  Concerns regarding medicines are outlined above. Labs and tests ordered  and medication changes are outlined in the patient instructions below:  Patient Instructions  Medication Instructions:  The current medical regimen is effective;  continue present plan and medications.  Follow-Up: Follow up in  6 months with Dr. Marlou Porch.  You will receive a letter in the mail 2 months before you are due.  Please call us when you receive this letter to schedule your follow up appointment.  If you need a refill on your cardiac medications before your next appointment, please call your pharmacy.  Thank you for choosing [facility]!!        Signed, Candee Furbish, MD  05/20/2017 2:18 PM    [facility]

## 2017-05-22 ENCOUNTER — Encounter (HOSPITAL_COMMUNITY)
Admission: RE | Admit: 2017-05-22 | Discharge: 2017-05-22 | Disposition: A | Discharge status: Home / Self Care | Payer: Medicare HMO | Source: Ambulatory Visit | Attending: Cardiology | Admitting: Cardiology

## 2017-05-22 DIAGNOSIS — Z951 Presence of aortocoronary bypass graft: Secondary | ICD-10-CM | POA: Diagnosis not present

## 2017-05-22 DIAGNOSIS — Z79899 Other long term (current) drug therapy: Secondary | ICD-10-CM | POA: Diagnosis not present

## 2017-05-22 DIAGNOSIS — G40909 Epilepsy, unspecified, not intractable, without status epilepticus: Secondary | ICD-10-CM | POA: Diagnosis not present

## 2017-05-22 DIAGNOSIS — Z7982 Long term (current) use of aspirin: Secondary | ICD-10-CM | POA: Diagnosis not present

## 2017-05-24 ENCOUNTER — Encounter (HOSPITAL_COMMUNITY)
Admission: RE | Admit: 2017-05-24 | Discharge: 2017-05-24 | Disposition: A | Discharge status: Home / Self Care | Payer: Medicare HMO | Source: Ambulatory Visit | Attending: Cardiology | Admitting: Cardiology

## 2017-05-24 DIAGNOSIS — Z951 Presence of aortocoronary bypass graft: Secondary | ICD-10-CM | POA: Diagnosis not present

## 2017-05-24 DIAGNOSIS — Z79899 Other long term (current) drug therapy: Secondary | ICD-10-CM | POA: Diagnosis not present

## 2017-05-24 DIAGNOSIS — G40909 Epilepsy, unspecified, not intractable, without status epilepticus: Secondary | ICD-10-CM | POA: Diagnosis not present

## 2017-05-24 DIAGNOSIS — Z7982 Long term (current) use of aspirin: Secondary | ICD-10-CM | POA: Diagnosis not present

## 2017-05-24 NOTE — Progress Notes (Signed)
Reviewed home exercise with pt today.  Pt plans to ride stationary bike for exercise.  Reviewed THR, pulse, RPE, sign and symptoms, and when to call 911 or MD.  Also discussed weather considerations and indoor options.  Pt voiced understanding.    Erik Salazar [employer]

## 2017-05-27 ENCOUNTER — Encounter (HOSPITAL_COMMUNITY)
Admission: RE | Admit: 2017-05-27 | Discharge: 2017-05-27 | Disposition: A | Discharge status: Home / Self Care | Payer: Medicare HMO | Source: Ambulatory Visit | Attending: Cardiology | Admitting: Cardiology

## 2017-05-27 DIAGNOSIS — Z79899 Other long term (current) drug therapy: Secondary | ICD-10-CM | POA: Diagnosis not present

## 2017-05-27 DIAGNOSIS — Z7982 Long term (current) use of aspirin: Secondary | ICD-10-CM | POA: Diagnosis not present

## 2017-05-27 DIAGNOSIS — Z951 Presence of aortocoronary bypass graft: Secondary | ICD-10-CM

## 2017-05-27 DIAGNOSIS — G40909 Epilepsy, unspecified, not intractable, without status epilepticus: Secondary | ICD-10-CM | POA: Diagnosis not present

## 2017-05-28 DIAGNOSIS — E782 Mixed hyperlipidemia: Secondary | ICD-10-CM | POA: Diagnosis not present

## 2017-05-28 DIAGNOSIS — Z23 Encounter for immunization: Secondary | ICD-10-CM | POA: Diagnosis not present

## 2017-05-28 DIAGNOSIS — L989 Disorder of the skin and subcutaneous tissue, unspecified: Secondary | ICD-10-CM | POA: Diagnosis not present

## 2017-05-28 NOTE — Progress Notes (Signed)
Cardiac Individual Treatment Plan  Patient Details  Name: Christien Frankl MRN: [id number] Date of Birth: 1951-04-21 Referring Provider:     CARDIAC REHAB PHASE II ORIENTATION from 05/07/2017 in [place]  Referring Provider  Candee Furbish MD      Initial Encounter Date:    CARDIAC REHAB PHASE II ORIENTATION from 05/07/2017 in [place]  Date  05/07/17  Referring Provider  Candee Furbish MD      Visit Diagnosis: S/P CABG x 1  Patient's Home Medications on Admission:  Current Outpatient Prescriptions:  .  amoxicillin (AMOXIL) 500 MG capsule, Take 2 grams (4 tablets) 1 hour prior to any dental procedures, Disp: 4 capsule, Rfl: 0 .  aspirin 325 MG tablet, Take 325 mg by mouth daily., Disp: , Rfl:  .  finasteride (PROSCAR) 5 MG tablet, Take 5 mg by mouth daily., Disp: , Rfl:  .  gabapentin (NEURONTIN) 400 MG capsule, Take 400 mg by mouth. Patient takes 400mg  by mouth in the morning,400 mg by mouth in the afternoon, and 400-800 by mouth in the evening., Disp: , Rfl:  .  lacosamide (VIMPAT) 50 MG TABS tablet, Begin 2 tablets twice a day for 4 weeks, then take one tablet twice a day for 4 weeks, then stop. (Patient taking differently: 50 mg 2 (two) times daily. ), Disp: 120 tablet, Rfl: 1 .  levETIRAcetam (KEPPRA) 750 MG tablet, Take 2 tablets (1,500 mg total) by mouth 2 (two) times daily., Disp: 120 tablet, Rfl: 5 .  pravastatin (PRAVACHOL) 40 MG tablet, Take 40 mg by mouth every evening., Disp: , Rfl:  .  tamsulosin (FLOMAX) 0.4 MG CAPS capsule, Take 0.4 mg by mouth every evening. , Disp: , Rfl:   Past Medical History: Past Medical History:  Diagnosis Date  . Enlarged prostate   . H/O: knee surgery    15   . History of open heart surgery    ASCENDING AORTIC ANEURYSM  . 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 Seizure disorder ([place]) 04/05/2017  . Seizures ([place])   . Status post knee surgery    DVT POST  KNEE SURGERY    Tobacco Use: History  Smoking Status  . Never Smoker  Smokeless Tobacco  . Never Used    Labs: Recent Review Flowsheet Data    Labs for ITP Cardiac and Pulmonary Rehab Latest Ref Rng & Units 12/13/2016   Cholestrol 100 - 199 mg/dL 199   LDLCALC 0 - 99 mg/dL 121(H)   HDL >39 mg/dL 45   Trlycerides 0 - 149 mg/dL 166(H)      Capillary Blood Glucose: No results found for: GLUCAP   Exercise Target Goals:    Exercise Program Goal: Individual exercise prescription set with THRR, safety & activity barriers. Participant demonstrates ability to understand and report RPE using BORG scale, to self-measure pulse accurately, and to acknowledge the importance of the exercise prescription.  Exercise Prescription Goal: Starting with aerobic activity 30 plus minutes a day, 3 days per week for initial exercise prescription. Provide home exercise prescription and guidelines that participant acknowledges understanding prior to discharge.  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 HR 126 bpm     Max Ex. BP 124/67     2 Minute Post BP 125/94        Oxygen Initial Assessment:   Oxygen  Re-Evaluation:   Oxygen Discharge (Final Oxygen Re-Evaluation):   Initial Exercise Prescription:     Initial Exercise Prescription - 05/07/17 0900      Date of Initial Exercise RX and Referring Provider   Date 05/07/17   Referring Provider Candee Furbish MD     Bike   Level 2  upright scifit   Watts 30   Minutes 10   METs 3.18     NuStep   Level 3   SPM 80   Minutes 10   METs 2.5     Track   Laps 11   Minutes 10   METs 2.92     Prescription Details   Frequency (times per week) 3   Duration Progress to 30 minutes of continuous aerobic without signs/symptoms of physical distress     Intensity   THRR 40-80% of Max Heartrate 62-123   Ratings of Perceived Exertion 11-13   Perceived Dyspnea 0-4     Progression   Progression Continue to progress workloads to maintain intensity without signs/symptoms of physical distress.     Resistance Training   Training Prescription Yes   Weight 2lbs   Reps 10-15      Perform Capillary Blood Glucose checks as needed.  Exercise Prescription Changes:     Exercise Prescription Changes    Row Name 05/13/17 1239 05/21/17 1200           Response to Exercise   Blood Pressure (Admit) 120/78 107/74      Blood Pressure (Exercise) 132/62 116/64      Blood Pressure (Exit) 104/69 106/72      Heart Rate (Admit) 97 bpm 86 bpm      Heart Rate (Exercise) 137 bpm 130 bpm      Heart Rate (Exit) 104 bpm 99 bpm      Rating of Perceived Exertion (Exercise) 12 13      Symptoms none none      Duration Continue with 30 min of aerobic exercise without signs/symptoms of physical distress. Continue with 30 min of aerobic exercise without signs/symptoms of physical distress.      Intensity THRR unchanged THRR unchanged        Progression   Progression Continue to progress workloads to maintain intensity without signs/symptoms of physical distress. Continue to progress workloads to maintain intensity without signs/symptoms of physical distress.       Average METs 3.3 3.1  Resistance Training   Training Prescription Yes Yes      Weight 2lbs 2lbs      Reps 10-15 10-15      Time 10 Minutes 10 Minutes        Bike   Level 2 3  upright scifit      Watts 25 25      Minutes 10 10      METs 2.9 2.8        NuStep   Level 3 4      SPM 100 85      Minutes 10 10      METs 3.8 3.3        Track   Laps 12 13      Minutes 10 10      METs 3.09 3.26         Exercise Comments:     Exercise Comments    Row Name 05/28/17 1434           Exercise Comments Reviewed METs and goals. Pt is tolerating exercise very well; will continue to monitor pt's progress and activity levels.           Exercise Goals and Review:     Exercise Goals    Row Name 05/07/17 1011             Exercise Goals   Increase Physical Activity Yes       Intervention Provide advice, education, support and counseling about physical activity/exercise needs.;Develop an individualized exercise prescription for aerobic and resistive training based on initial evaluation findings, risk stratification, comorbidities and participant's personal goals.       Expected Outcomes Achievement of increased cardiorespiratory fitness and enhanced flexibility, muscular endurance and strength shown through measurements of functional capacity and personal statement of participant.       Increase Strength and Stamina Yes       Intervention Provide advice, education, support and counseling about physical activity/exercise needs.;Develop an individualized exercise prescription for aerobic and resistive training based on initial evaluation findings, risk stratification, comorbidities and participant's personal goals.       Expected Outcomes Achievement of increased cardiorespiratory fitness and enhanced flexibility, muscular endurance and strength shown through measurements of functional capacity and personal statement of participant.       Able to understand and use rate of  perceived exertion (RPE) scale Yes       Intervention Provide education and explanation on how to use RPE scale       Expected Outcomes Short Term: Able to use RPE daily in rehab to express subjective intensity level;Long Term:  Able to use RPE to guide intensity level when exercising independently       Knowledge and understanding of Target Heart Rate Range (THRR) Yes       Intervention Provide education and explanation of THRR including how the numbers were predicted and where they are located for reference       Expected Outcomes Short Term: Able to state/look up THRR;Long Term: Able to use THRR to govern intensity when exercising independently;Short Term: Able to use daily as guideline for intensity in rehab       Able to check pulse independently Yes       Intervention Provide education and demonstration on how to check pulse in carotid and radial arteries.;Review the importance of being able to check your own pulse for safety during independent exercise       Expected Outcomes Short Term: Able to  explain why pulse checking is important during independent exercise;Long Term: Able to check pulse independently and accurately       Understanding of Exercise Prescription Yes       Intervention Provide education, explanation, and written materials on patient's individual exercise prescription       Expected Outcomes Short Term: Able to explain program exercise prescription;Long Term: Able to explain home exercise prescription to exercise independently          Exercise Goals Re-Evaluation :     Exercise Goals Re-Evaluation    Row Name 05/24/17 1425 05/28/17 1440           Exercise Goal Re-Evaluation   Exercise Goals Review Increase Physical Activity;Able to understand and use rate of perceived exertion (RPE) scale;Knowledge and understanding of Target Heart Rate Range (THRR);Understanding of Exercise Prescription;Increase Strength and Stamina;Able to check pulse independently Increase  Physical Activity;Able to understand and use rate of perceived exertion (RPE) scale;Knowledge and understanding of Target Heart Rate Range (THRR);Understanding of Exercise Prescription;Increase Strength and Stamina;Able to check pulse independently      Comments Reviewed home exercise with pt today.  Pt plans to ride stationary bike for exercise.  Reviewed THR, pulse, RPE, sign and symptoms, and when to call 911 or MD.  Also discussed weather considerations and indoor options.  Pt voiced understanding. Reviewed home exercise with pt today.  Pt plans to ride stationary bike for exercise.  Reviewed THR, pulse, RPE, sign and symptoms, and when to call 911 or MD.  Also discussed weather considerations and indoor options.  Pt voiced understanding.      Expected Outcomes Pt will be compliant with HEP and improve in cardiorespiratory fitness Pt will be compliant with HEP and improve in cardiorespiratory fitness          Discharge Exercise Prescription (Final Exercise Prescription Changes):     Exercise Prescription Changes - 05/21/17 1200      Response to Exercise   Blood Pressure (Admit) 107/74   Blood Pressure (Exercise) 116/64   Blood Pressure (Exit) 106/72   Heart Rate (Admit) 86 bpm   Heart Rate (Exercise) 130 bpm   Heart Rate (Exit) 99 bpm   Rating of Perceived Exertion (Exercise) 13   Symptoms none   Duration Continue with 30 min of aerobic exercise without signs/symptoms of physical distress.   Intensity THRR unchanged     Progression   Progression Continue to progress workloads to maintain intensity without signs/symptoms of physical distress.   Average METs 3.1     Resistance Training   Training Prescription Yes   Weight 2lbs   Reps 10-15   Time 10 Minutes     Bike   Level 3  upright scifit   Watts 25   Minutes 10   METs 2.8     NuStep   Level 4   SPM 85   Minutes 10   METs 3.3     Track   Laps 13   Minutes 10   METs 3.26      Nutrition:  Target Goals:  Understanding of nutrition guidelines, daily intake of sodium 1500mg , cholesterol 200mg , calories 30% from fat and 7% or less from saturated fats, daily to have 5 or more servings of fruits and vegetables.  Biometrics:     Pre Biometrics - 05/07/17 1011      Pre Biometrics   Waist Circumference 36 inches   Hip Circumference 38.75 inches   Waist to Hip Ratio 0.93 %  Triceps Skinfold 10 mm   % Body Fat 26.4 %   Grip Strength 29.5 kg   Flexibility 15.5 in   Single Leg Stand 15.21 seconds       Nutrition Therapy Plan and Nutrition Goals:     Nutrition Therapy & Goals - 05/15/17 1025      Nutrition Therapy   Diet Therapeutic Lifestyle Changes     Personal Nutrition Goals   Nutrition Goal Pt to identify food quantities necessary to achieve weight loss of 6-15 lb at graduation from cardiac rehab.      Intervention Plan   Intervention Prescribe, educate and counsel regarding individualized specific dietary modifications aiming towards targeted core components such as weight, hypertension, lipid management, diabetes, heart failure and other comorbidities.   Expected Outcomes Short Term Goal: Understand basic principles of dietary content, such as calories, fat, sodium, cholesterol and nutrients.;Long Term Goal: Adherence to prescribed nutrition plan.      Nutrition Discharge: Nutrition Scores:     Nutrition Assessments - 05/15/17 1024      MEDFICTS Scores   Pre Score 30      Nutrition Goals Re-Evaluation:   Nutrition Goals Re-Evaluation:   Nutrition Goals Discharge (Final Nutrition Goals Re-Evaluation):   Psychosocial: Target Goals: Acknowledge presence or absence of significant depression and/or stress, maximize coping skills, provide positive support system. Participant is able to verbalize types and ability to use techniques and skills needed for reducing stress and depression.  Initial Review & Psychosocial Screening:     Initial Psych Review & Screening -  05/07/17 0849      Initial Review   Current issues with Current Stress Concerns;Current Anxiety/Panic  health related anxiety   Source of Stress Concerns Chronic Illness;Unable to perform yard/household activities;Unable to participate in former interests or hobbies   Comments pt s/p redo CABG and CVA with deficits.       Family Dynamics   Good Support System? Yes  spouse, family, friends      Barriers   Psychosocial barriers to participate in program The patient should benefit from training in stress management and relaxation.     Screening Interventions   Interventions Encouraged to exercise      Quality of Life Scores:     Quality of Life - 05/24/17 1527      Quality of Life Scores   Health/Function Pre 20 %   Socioeconomic Pre 27.7 %   Psych/Spiritual Pre 24.86 %   Family Pre 25.2 %   GLOBAL Pre 23.49 %  QOL scores reviewed with pt.  pt admits biggest concern is functional decline from CVA deficits and recent cardiac event. pt eager to participate in rehab services to regain strength.  pt displays hopeful outlook with good coping skills.,       PHQ-9: Recent Review Flowsheet Data    Depression screen [facility] 2/9 05/13/2017   Decreased Interest 0   Down, Depressed, Hopeless 0   PHQ - 2 Score 0     Interpretation of Total Score  Total Score Depression Severity:  1-4 = Minimal depression, 5-9 = Mild depression, 10-14 = Moderate depression, 15-19 = Moderately severe depression, 20-27 = Severe depression   Psychosocial Evaluation and Intervention:     Psychosocial Evaluation - 05/13/17 1638      Psychosocial Evaluation & Interventions   Interventions Encouraged to exercise with the program and follow exercise prescription;Stress management education;Relaxation education   Comments pt with health related stress and anxiety. pt discouraged with the amount of  disability that has resulted from his recent CVA and cardiac event.  pt looking forward to improving his health,  funcational capaciity and wellbeing.    Expected Outcomes pt will exhibit positive outlook with good coping skills.    Continue Psychosocial Services  Follow up required by staff      Psychosocial Re-Evaluation:     Psychosocial Re-Evaluation    [place] Name 05/22/17 1052             Psychosocial Re-Evaluation   Current issues with Current Stress Concerns;Current Anxiety/Panic       Comments pt demonstrates decreased health related stress and anxiety with CR participation.  pt eager to improve funcational ability       Expected Outcomes pt will exhibit positive outlook with good coping skills.        Interventions Encouraged to attend Cardiac Rehabilitation for the exercise;Stress management education;Relaxation education       Continue Psychosocial Services  No Follow up required       Comments pt s/p redo CABG and CVA with deficits.           Initial Review   Source of Stress Concerns Chronic Illness;Unable to perform yard/household activities;Unable to participate in former interests or hobbies          Psychosocial Discharge (Final Psychosocial Re-Evaluation):     Psychosocial Re-Evaluation - 05/22/17 1052      Psychosocial Re-Evaluation   Current issues with Current Stress Concerns;Current Anxiety/Panic   Comments pt demonstrates decreased health related stress and anxiety with CR participation.  pt eager to improve funcational ability   Expected Outcomes pt will exhibit positive outlook with good coping skills.    Interventions Encouraged to attend Cardiac Rehabilitation for the exercise;Stress management education;Relaxation education   Continue Psychosocial Services  No Follow up required   Comments pt s/p redo CABG and CVA with deficits.       Initial Review   Source of Stress Concerns Chronic Illness;Unable to perform yard/household activities;Unable to participate in former interests or hobbies      Vocational Rehabilitation: Provide vocational rehab assistance  to qualifying candidates.   Vocational Rehab Evaluation & Intervention:     Vocational Rehab - 05/07/17 0848      Initial Vocational Rehab Evaluation & Intervention   Assessment shows need for Vocational Rehabilitation No  retired       Education: Education Goals: Education classes will be provided on a weekly basis, covering required topics. Participant will state understanding/return demonstration of topics presented.  Learning Barriers/Preferences:     Learning Barriers/Preferences - 05/07/17 1014      Learning Barriers/Preferences   Learning Barriers Sight   Learning Preferences Written Material;Video;Verbal Instruction;Skilled Demonstration;Pictoral      Education Topics: Count Your Pulse:  -Group instruction provided by verbal instruction, demonstration, patient participation and written materials to support subject.  Instructors address importance of being able to find your pulse and how to count your pulse when at home without a heart monitor.  Patients get hands on experience counting their pulse with staff help and individually.   Heart Attack, Angina, and Risk Factor Modification:  -Group instruction provided by verbal instruction, video, and written materials to support subject.  Instructors address signs and symptoms of angina and heart attacks.    Also discuss risk factors for heart disease and how to make changes to improve heart health risk factors.   Functional Fitness:  -Group instruction provided by verbal instruction, demonstration, patient participation, and written materials  to support subject.  Instructors address safety measures for doing things around the house.  Discuss how to get up and down off the floor, how to pick things up properly, how to safely get out of a chair without assistance, and balance training.   Meditation and Mindfulness:  -Group instruction provided by verbal instruction, patient participation, and written materials to support  subject.  Instructor addresses importance of mindfulness and meditation practice to help reduce stress and improve awareness.  Instructor also leads participants through a meditation exercise.    CARDIAC REHAB PHASE II EXERCISE from 05/24/2017 in [facility]  Date  05/22/17  Instruction Review Code  2- meets goals/outcomes      Stretching for Flexibility and Mobility:  -Group instruction provided by verbal instruction, patient participation, and written materials to support subject.  Instructors lead participants through series of stretches that are designed to increase flexibility thus improving mobility.  These stretches are additional exercise for major muscle groups that are typically performed during regular warm up and cool down.   CARDIAC REHAB PHASE II EXERCISE from 05/24/2017 in [place]  Date  05/24/17  Instruction Review Code  2- meets goals/outcomes      Hands Only CPR:  -Group verbal, video, and participation provides a basic overview of AHA guidelines for community CPR. Role-play of emergencies allow participants the opportunity to practice calling for help and chest compression technique with discussion of AED use.   Hypertension: -Group verbal and written instruction that provides a basic overview of hypertension including the most recent diagnostic guidelines, risk factor reduction with self-care instructions and medication management.   CARDIAC REHAB PHASE II EXERCISE from 05/24/2017 in [place]  Date  05/17/17  Instruction Review Code  2- meets goals/outcomes       Nutrition I class: Heart Healthy Eating:  -Group instruction provided by PowerPoint slides, verbal discussion, and written materials to support subject matter. The instructor gives an explanation and review of the Therapeutic Lifestyle Changes diet recommendations, which includes a discussion on lipid goals,  dietary fat, sodium, fiber, plant stanol/sterol esters, sugar, and the components of a well-balanced, healthy diet.   CARDIAC REHAB PHASE II EXERCISE from 05/24/2017 in [place]  Date  05/21/17  Educator  RD  Instruction Review Code  2- meets goals/outcomes      Nutrition II class: Lifestyle Skills:  -Group instruction provided by PowerPoint slides, verbal discussion, and written materials to support subject matter. The instructor gives an explanation and review of label reading, grocery shopping for heart health, heart healthy recipe modifications, and ways to make healthier choices when eating out.   Diabetes Question & Answer:  -Group instruction provided by PowerPoint slides, verbal discussion, and written materials to support subject matter. The instructor gives an explanation and review of diabetes co-morbidities, pre- and post-prandial blood glucose goals, pre-exercise blood glucose goals, signs, symptoms, and treatment of hypoglycemia and hyperglycemia, and foot care basics.   Diabetes Blitz:  -Group instruction provided by PowerPoint slides, verbal discussion, and written materials to support subject matter. The instructor gives an explanation and review of the physiology behind type 1 and type 2 diabetes, diabetes medications and rational behind using different medications, pre- and post-prandial blood glucose recommendations and Hemoglobin A1c goals, diabetes diet, and exercise including blood glucose guidelines for exercising safely.    Portion Distortion:  -Group instruction provided by PowerPoint slides, verbal discussion, written  materials, and food models to support subject matter. The instructor gives an explanation of serving size versus portion size, changes in portions sizes over the last 20 years, and what consists of a serving from each food group.   Stress Management:  -Group instruction provided by verbal instruction, video, and  written materials to support subject matter.  Instructors review role of stress in heart disease and how to cope with stress positively.     Exercising on Your Own:  -Group instruction provided by verbal instruction, power point, and written materials to support subject.  Instructors discuss benefits of exercise, components of exercise, frequency and intensity of exercise, and end points for exercise.  Also discuss use of nitroglycerin and activating EMS.  Review options of places to exercise outside of rehab.  Review guidelines for sex with heart disease.   Cardiac Drugs I:  -Group instruction provided by verbal instruction and written materials to support subject.  Instructor reviews cardiac drug classes: antiplatelets, anticoagulants, beta blockers, and statins.  Instructor discusses reasons, side effects, and lifestyle considerations for each drug class.   Cardiac Drugs II:  -Group instruction provided by verbal instruction and written materials to support subject.  Instructor reviews cardiac drug classes: angiotensin converting enzyme inhibitors (ACE-I), angiotensin II receptor blockers (ARBs), nitrates, and calcium channel blockers.  Instructor discusses reasons, side effects, and lifestyle considerations for each drug class.   Anatomy and Physiology of the Circulatory System:  Group verbal and written instruction and models provide basic cardiac anatomy and physiology, with the coronary electrical and arterial systems. Review of: AMI, Angina, Valve disease, Heart Failure, Peripheral Artery Disease, Cardiac Arrhythmia, Pacemakers, and the ICD.   CARDIAC REHAB PHASE II EXERCISE from 05/24/2017 in [place]  Date  05/15/17  Educator  RN  Instruction Review Code  2- meets goals/outcomes      Other Education:  -Group or individual verbal, written, or video instructions that support the educational goals of the cardiac rehab program.   Knowledge  Questionnaire Score:     Knowledge Questionnaire Score - 05/07/17 0848      Knowledge Questionnaire Score   Pre Score 23/24      Core Components/Risk Factors/Patient Goals at Admission:     Personal Goals and Risk Factors at Admission - 05/07/17 1031      Core Components/Risk Factors/Patient Goals on Admission   Admit Weight 175 lb 4.3 oz (79.5 kg)      Core Components/Risk Factors/Patient Goals Review:      Goals and Risk Factor Review    Row Name 05/22/17 1048 05/24/17 1532           Core Components/Risk Factors/Patient Goals Review   Personal Goals Review Hypertension;Improve shortness of breath with ADL's;Stress Hypertension;Improve shortness of breath with ADL's;Stress      Review pt with desire to maintain current weight and improve functional symptoms. pt demonstrates eagerness to particpate in [place] activities.  BP well managed at CR.  pt with desire to maintain current weight and improve functional symptoms. pt demonstrates eagerness to particpate in [place] activities.  BP well managed at CR.       Expected Outcomes pt will participate in CR exercise, nutrition and lifestyle modification activities to decrease overall RF.  pt will participate in CR exercise, nutrition and lifestyle modification activities to decrease overall RF.          Core Components/Risk Factors/Patient Goals at Discharge (Final Review):      Goals and Risk  Factor Review - 05/24/17 1532      Core Components/Risk Factors/Patient Goals Review   Personal Goals Review Hypertension;Improve shortness of breath with ADL's;Stress   Review pt with desire to maintain current weight and improve functional symptoms. pt demonstrates eagerness to particpate in [place] activities.  BP well managed at CR.    Expected Outcomes pt will participate in CR exercise, nutrition and lifestyle modification activities to decrease overall RF.       ITP Comments:     ITP Comments    Row Name 05/07/17 0750 05/28/17 1644          ITP Comments Dr. Fransico Him, Medical Director  30day ITP review.  pt with good attendance and participation.          Comments:

## 2017-05-29 ENCOUNTER — Telehealth: Payer: Self-pay | Admitting: Cardiology

## 2017-05-29 ENCOUNTER — Encounter (HOSPITAL_COMMUNITY)
Admission: RE | Admit: 2017-05-29 | Discharge: 2017-05-29 | Disposition: A | Discharge status: Home / Self Care | Payer: Medicare HMO | Source: Ambulatory Visit | Attending: Cardiology | Admitting: Cardiology

## 2017-05-29 DIAGNOSIS — Z79899 Other long term (current) drug therapy: Secondary | ICD-10-CM | POA: Diagnosis not present

## 2017-05-29 DIAGNOSIS — G40909 Epilepsy, unspecified, not intractable, without status epilepticus: Secondary | ICD-10-CM | POA: Diagnosis not present

## 2017-05-29 DIAGNOSIS — Z951 Presence of aortocoronary bypass graft: Secondary | ICD-10-CM | POA: Diagnosis not present

## 2017-05-29 DIAGNOSIS — Z7982 Long term (current) use of aspirin: Secondary | ICD-10-CM | POA: Diagnosis not present

## 2017-05-29 MED ORDER — PRAVASTATIN SODIUM 40 MG PO TABS: 40.0000 mg | ORAL_TABLET | Freq: Every evening | ORAL | 3 refills | Status: DC

## 2017-05-29 NOTE — Telephone Encounter (Signed)
New message   Patient wants to know if this medication should be continued.  Please call   Pt c/o medication issue:  1. Name of Medication: pravastatin (PRAVACHOL) 40 MG tablet  2. How are you currently taking this medication (dosage and times per day)? Take 40 mg by mouth every evening.  3. Are you having a reaction (difficulty breathing--STAT)? no  4. What is your medication issue? Patient request clarification if he should continue taking

## 2017-05-29 NOTE — Telephone Encounter (Signed)
Patient calling and asking if he needs to continue taking pravastatin 40 mg QD. Per Dr. Marlou Porch OV note on 10/1, patinet is to continue with pravastatin and that his Lipids are being checked by Dr. Olen Pel. Patient states that he had his cholesterol checked by Dr. Olen Pel, results as follows:  Total- 142 TG- 93 LDL- 81  Patient advised to continue pravastatin for now and that the information would be forwarded to Dr. Marlou Porch for review. Patient verbalized understanding.

## 2017-05-31 ENCOUNTER — Encounter (HOSPITAL_COMMUNITY)
Admission: RE | Admit: 2017-05-31 | Discharge: 2017-05-31 | Disposition: A | Discharge status: Home / Self Care | Payer: Medicare HMO | Source: Ambulatory Visit | Attending: Cardiology | Admitting: Cardiology

## 2017-05-31 DIAGNOSIS — G40909 Epilepsy, unspecified, not intractable, without status epilepticus: Secondary | ICD-10-CM | POA: Diagnosis not present

## 2017-05-31 DIAGNOSIS — Z951 Presence of aortocoronary bypass graft: Secondary | ICD-10-CM

## 2017-05-31 DIAGNOSIS — Z79899 Other long term (current) drug therapy: Secondary | ICD-10-CM | POA: Diagnosis not present

## 2017-05-31 DIAGNOSIS — Z7982 Long term (current) use of aspirin: Secondary | ICD-10-CM | POA: Diagnosis not present

## 2017-06-03 ENCOUNTER — Encounter (HOSPITAL_COMMUNITY)
Admission: RE | Admit: 2017-06-03 | Discharge: 2017-06-03 | Disposition: A | Discharge status: Home / Self Care | Payer: Medicare HMO | Source: Ambulatory Visit | Attending: Cardiology | Admitting: Cardiology

## 2017-06-03 DIAGNOSIS — Z7982 Long term (current) use of aspirin: Secondary | ICD-10-CM | POA: Diagnosis not present

## 2017-06-03 DIAGNOSIS — G40909 Epilepsy, unspecified, not intractable, without status epilepticus: Secondary | ICD-10-CM | POA: Diagnosis not present

## 2017-06-03 DIAGNOSIS — Z951 Presence of aortocoronary bypass graft: Secondary | ICD-10-CM

## 2017-06-03 DIAGNOSIS — Z79899 Other long term (current) drug therapy: Secondary | ICD-10-CM | POA: Diagnosis not present

## 2017-06-03 NOTE — Telephone Encounter (Signed)
Patient made aware of Dr. Marlou Porch recommendations to continue pravastatin. Patient verbalized understanding and thanked me for the call.

## 2017-06-03 NOTE — Telephone Encounter (Signed)
Please continue pravastatin.  Thanks Candee Furbish, MD

## 2017-06-05 ENCOUNTER — Encounter (HOSPITAL_COMMUNITY)
Admission: RE | Admit: 2017-06-05 | Discharge: 2017-06-05 | Disposition: A | Discharge status: Home / Self Care | Payer: Medicare HMO | Source: Ambulatory Visit | Attending: Cardiology | Admitting: Cardiology

## 2017-06-05 DIAGNOSIS — G40909 Epilepsy, unspecified, not intractable, without status epilepticus: Secondary | ICD-10-CM | POA: Diagnosis not present

## 2017-06-05 DIAGNOSIS — Z79899 Other long term (current) drug therapy: Secondary | ICD-10-CM | POA: Diagnosis not present

## 2017-06-05 DIAGNOSIS — Z951 Presence of aortocoronary bypass graft: Secondary | ICD-10-CM | POA: Diagnosis not present

## 2017-06-05 DIAGNOSIS — Z7982 Long term (current) use of aspirin: Secondary | ICD-10-CM | POA: Diagnosis not present

## 2017-06-07 ENCOUNTER — Encounter (HOSPITAL_COMMUNITY)
Admission: RE | Admit: 2017-06-07 | Discharge: 2017-06-07 | Disposition: A | Discharge status: Home / Self Care | Payer: Medicare HMO | Source: Ambulatory Visit | Attending: Cardiology | Admitting: Cardiology

## 2017-06-07 DIAGNOSIS — Z79899 Other long term (current) drug therapy: Secondary | ICD-10-CM | POA: Diagnosis not present

## 2017-06-07 DIAGNOSIS — Z951 Presence of aortocoronary bypass graft: Secondary | ICD-10-CM | POA: Diagnosis not present

## 2017-06-07 DIAGNOSIS — G40909 Epilepsy, unspecified, not intractable, without status epilepticus: Secondary | ICD-10-CM | POA: Diagnosis not present

## 2017-06-07 DIAGNOSIS — Z7982 Long term (current) use of aspirin: Secondary | ICD-10-CM | POA: Diagnosis not present

## 2017-06-10 ENCOUNTER — Encounter (HOSPITAL_COMMUNITY)
Admission: RE | Admit: 2017-06-10 | Discharge: 2017-06-10 | Disposition: A | Discharge status: Home / Self Care | Payer: Medicare HMO | Source: Ambulatory Visit | Attending: Cardiology | Admitting: Cardiology

## 2017-06-10 DIAGNOSIS — Z79899 Other long term (current) drug therapy: Secondary | ICD-10-CM | POA: Diagnosis not present

## 2017-06-10 DIAGNOSIS — G40909 Epilepsy, unspecified, not intractable, without status epilepticus: Secondary | ICD-10-CM | POA: Diagnosis not present

## 2017-06-10 DIAGNOSIS — Z951 Presence of aortocoronary bypass graft: Secondary | ICD-10-CM

## 2017-06-10 DIAGNOSIS — Z7982 Long term (current) use of aspirin: Secondary | ICD-10-CM | POA: Diagnosis not present

## 2017-06-10 NOTE — Progress Notes (Addendum)
Pt arrived at cardiac rehab reporting he has discontinued metoprolol and vimpat per MD instructions.  Pt has increased gabapentin however unsure of dosage.  Med list reconciled. Andi Hence, RN, BSN Cardiac Pulmonary Rehab

## 2017-06-12 ENCOUNTER — Encounter (HOSPITAL_COMMUNITY)
Admission: RE | Admit: 2017-06-12 | Discharge: 2017-06-12 | Disposition: A | Discharge status: Home / Self Care | Payer: Medicare HMO | Source: Ambulatory Visit | Attending: Cardiology | Admitting: Cardiology

## 2017-06-12 DIAGNOSIS — Z79899 Other long term (current) drug therapy: Secondary | ICD-10-CM | POA: Diagnosis not present

## 2017-06-12 DIAGNOSIS — Z951 Presence of aortocoronary bypass graft: Secondary | ICD-10-CM | POA: Diagnosis not present

## 2017-06-12 DIAGNOSIS — Z7982 Long term (current) use of aspirin: Secondary | ICD-10-CM | POA: Diagnosis not present

## 2017-06-12 DIAGNOSIS — G40909 Epilepsy, unspecified, not intractable, without status epilepticus: Secondary | ICD-10-CM | POA: Diagnosis not present

## 2017-06-13 ENCOUNTER — Telehealth: Payer: Self-pay | Admitting: Neurology

## 2017-06-13 ENCOUNTER — Encounter: Payer: Self-pay | Admitting: Neurology

## 2017-06-13 NOTE — Telephone Encounter (Signed)
I will dictate a letter. 

## 2017-06-13 NOTE — Telephone Encounter (Signed)
Pt called said he has went over the limit with his prescription drug plan for the year. He was advised he needs a letter stating he needs to stay onlevETIRAcetam (KEPPRA) 750 MG tablet,gabapentin (NEURONTIN) 400 MG capsule and lacosamide (VIMPAT) 50 MG TABS tablet  due to the stroke and seizure. Please address to [employer]  [employer]). Pls call when ready, he will pick up the letter

## 2017-06-14 ENCOUNTER — Encounter: Payer: Self-pay | Admitting: Podiatry

## 2017-06-14 ENCOUNTER — Encounter (HOSPITAL_COMMUNITY)
Admission: RE | Admit: 2017-06-14 | Discharge: 2017-06-14 | Disposition: A | Discharge status: Home / Self Care | Payer: Medicare HMO | Source: Ambulatory Visit | Attending: Cardiology | Admitting: Cardiology

## 2017-06-14 ENCOUNTER — Ambulatory Visit (INDEPENDENT_AMBULATORY_CARE_PROVIDER_SITE_OTHER): Payer: Medicare HMO | Admitting: Podiatry

## 2017-06-14 DIAGNOSIS — L600 Ingrowing nail: Secondary | ICD-10-CM

## 2017-06-14 DIAGNOSIS — Z951 Presence of aortocoronary bypass graft: Secondary | ICD-10-CM

## 2017-06-14 DIAGNOSIS — G40909 Epilepsy, unspecified, not intractable, without status epilepticus: Secondary | ICD-10-CM | POA: Diagnosis not present

## 2017-06-14 DIAGNOSIS — Z7982 Long term (current) use of aspirin: Secondary | ICD-10-CM | POA: Diagnosis not present

## 2017-06-14 DIAGNOSIS — Z79899 Other long term (current) drug therapy: Secondary | ICD-10-CM | POA: Diagnosis not present

## 2017-06-14 NOTE — Telephone Encounter (Signed)
Called and LVM for pt letting him know letter ready for pick up. Advised we close at 12pm today and re-open Monday at [place] up front for patient pick up.

## 2017-06-14 NOTE — Progress Notes (Signed)
   Subjective:    Patient ID: Erik Salazar, male    DOB: 05-Aug-1951, 66 y.o.   MRN: [id number]  HPI    Review of Systems  All other systems reviewed and are negative.      Objective:   Physical Exam        Assessment & Plan:

## 2017-06-14 NOTE — Patient Instructions (Signed)

## 2017-06-17 ENCOUNTER — Encounter (HOSPITAL_COMMUNITY)
Admission: RE | Admit: 2017-06-17 | Discharge: 2017-06-17 | Disposition: A | Discharge status: Home / Self Care | Payer: Medicare HMO | Source: Ambulatory Visit | Attending: Cardiology | Admitting: Cardiology

## 2017-06-17 ENCOUNTER — Telehealth: Payer: Self-pay | Admitting: Neurology

## 2017-06-17 DIAGNOSIS — Z7982 Long term (current) use of aspirin: Secondary | ICD-10-CM | POA: Diagnosis not present

## 2017-06-17 DIAGNOSIS — G40909 Epilepsy, unspecified, not intractable, without status epilepticus: Secondary | ICD-10-CM | POA: Diagnosis not present

## 2017-06-17 DIAGNOSIS — Z951 Presence of aortocoronary bypass graft: Secondary | ICD-10-CM | POA: Diagnosis not present

## 2017-06-17 DIAGNOSIS — Z79899 Other long term (current) drug therapy: Secondary | ICD-10-CM | POA: Diagnosis not present

## 2017-06-17 DIAGNOSIS — R972 Elevated prostate specific antigen [PSA]: Secondary | ICD-10-CM | POA: Diagnosis not present

## 2017-06-17 NOTE — Telephone Encounter (Signed)
can pt remove hand brace? He states has no pain in the arm but a couple fingers have numbness. Best call back is [phone]

## 2017-06-17 NOTE — Telephone Encounter (Signed)
I called the patient.  If the patient has not had any pain whatsoever in the arm, he may stop using the brace, he will restart if the pain returns.

## 2017-06-17 NOTE — Telephone Encounter (Signed)
Called and LVM for pt letting him know Dr [name] out of the office today. I will address his questions with Dr [name] once he is back in the office tomorrow. We will call him back to advise.

## 2017-06-19 ENCOUNTER — Telehealth (HOSPITAL_COMMUNITY): Payer: Self-pay | Admitting: Cardiac Rehabilitation

## 2017-06-19 ENCOUNTER — Encounter (HOSPITAL_COMMUNITY)
Admission: RE | Admit: 2017-06-19 | Discharge: 2017-06-19 | Disposition: A | Discharge status: Home / Self Care | Payer: Medicare HMO | Source: Ambulatory Visit | Attending: Cardiology | Admitting: Cardiology

## 2017-06-19 DIAGNOSIS — Z951 Presence of aortocoronary bypass graft: Secondary | ICD-10-CM | POA: Diagnosis not present

## 2017-06-19 DIAGNOSIS — G40909 Epilepsy, unspecified, not intractable, without status epilepticus: Secondary | ICD-10-CM | POA: Diagnosis not present

## 2017-06-19 DIAGNOSIS — Z7982 Long term (current) use of aspirin: Secondary | ICD-10-CM | POA: Diagnosis not present

## 2017-06-19 DIAGNOSIS — Z79899 Other long term (current) drug therapy: Secondary | ICD-10-CM | POA: Diagnosis not present

## 2017-06-19 NOTE — Telephone Encounter (Signed)
-----   Message from Jerline Pain, MD sent at 06/12/2017  5:18 PM EDT ----- Regarding: RE: cardiac rehab  Thank you for the update.  Please continue with current rehabilitation.  I am comfortable with his heart rate elevated in that range during exercise.  We are unable to use Toprol because of hypotension. Candee Furbish, MD  ----- Message ----- From: Lowell Guitar, RN Sent: 06/12/2017   5:00 PM To: Jerline Pain, MD Subject: cardiac rehab                                  Dear Dr. Marlou Porch,  Pt tachycardic at rest today at cardiac rehab.  Resting HR-sinus 105  down to 94 with sitting.  In addition, pt frequently exceeds THR-139 with exercise.  Exercise HR-140s.  BP:  100/60 rest, 130/40 peak exercise.  Pt has recently stopped vimpat per neurologist and increased gabapentin.  Pt also stopped toprol XL one 4 weeks ago due to dizziness, weakness and fatigue.  He is tolerating CR exercises without difficulty.    Please advise.  Thank you, Andi Hence, RN, BSN Cardiac Pulmonary Rehab

## 2017-06-19 NOTE — Progress Notes (Signed)
Subjective:    Patient ID: Erik Salazar, male   DOB: 66 y.o.   MRN: [id number]   HPI patient presents stating I got painful ingrown toenails of both big toes and I've tried to soak them and tried to trim them myself without relief and they're gradually more aggravating    Review of Systems  All other systems reviewed and are negative.       Objective:  Physical Exam  Constitutional: He appears well-developed and well-nourished.  Cardiovascular: Intact distal pulses.   Pulmonary/Chest: Effort normal.  Musculoskeletal: Normal range of motion.  Neurological: He is alert.  Skin: Skin is warm.  Nursing note and vitals reviewed.  neurovascular status intact muscle strength adequate range of motion within normal limits with patient found to have incurvated hallux nail borders medial side of both feet that are painful when pressed with distal redness but no active drainage. 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 Explaining procedure risk and patient signed consent form understanding risk of surgery. Today I infiltrated each hallux 60 mg I can Marcaine mixture and under sterile conditions with sterile instrument I removed the medial borders bilateral exposed matrix and applied phenol 3 applications 30 seconds followed by alcohol lavaged sterile dressing. Gave instructions on soaks and reappoint

## 2017-06-21 ENCOUNTER — Encounter (HOSPITAL_COMMUNITY)
Admission: RE | Admit: 2017-06-21 | Discharge: 2017-06-21 | Disposition: A | Discharge status: Home / Self Care | Payer: Medicare HMO | Source: Ambulatory Visit | Attending: Cardiology | Admitting: Cardiology

## 2017-06-21 ENCOUNTER — Ambulatory Visit (INDEPENDENT_AMBULATORY_CARE_PROVIDER_SITE_OTHER): Payer: Self-pay | Admitting: Podiatry

## 2017-06-21 ENCOUNTER — Telehealth: Payer: Self-pay | Admitting: Podiatry

## 2017-06-21 DIAGNOSIS — Z951 Presence of aortocoronary bypass graft: Secondary | ICD-10-CM | POA: Diagnosis not present

## 2017-06-21 DIAGNOSIS — Z7982 Long term (current) use of aspirin: Secondary | ICD-10-CM | POA: Insufficient documentation

## 2017-06-21 DIAGNOSIS — G40909 Epilepsy, unspecified, not intractable, without status epilepticus: Secondary | ICD-10-CM | POA: Insufficient documentation

## 2017-06-21 DIAGNOSIS — Z79899 Other long term (current) drug therapy: Secondary | ICD-10-CM | POA: Insufficient documentation

## 2017-06-21 DIAGNOSIS — L03031 Cellulitis of right toe: Secondary | ICD-10-CM

## 2017-06-21 MED ORDER — CEPHALEXIN 500 MG PO CAPS
500.0000 mg | ORAL_CAPSULE | Freq: Three times a day (TID) | ORAL | 1 refills | Status: DC
Start: 2017-06-21 — End: 2017-07-08

## 2017-06-21 NOTE — Progress Notes (Signed)
Cardiac Individual Treatment Plan  Patient Details  Name: Erik Salazar MRN: [id number] Date of Birth: 1951/08/03 Referring Provider:     CARDIAC REHAB PHASE II ORIENTATION from 05/07/2017 in [facility]  Referring Provider  Candee Furbish MD      Initial Encounter Date:    CARDIAC REHAB PHASE II ORIENTATION from 05/07/2017 in [facility]  Date  05/07/17  Referring Provider  Candee Furbish MD      Visit Diagnosis: S/P CABG x 1  Patient's Home Medications on Admission:  Current Outpatient Prescriptions:  .  amoxicillin (AMOXIL) 500 MG capsule, Take 2 grams (4 tablets) 1 hour prior to any dental procedures, Disp: 4 capsule, Rfl: 0 .  aspirin 325 MG tablet, Take 325 mg by mouth daily., Disp: , Rfl:  .  cephALEXin (KEFLEX) 500 MG capsule, Take 1 capsule (500 mg total) by mouth 3 (three) times daily., Disp: 30 capsule, Rfl: 1 .  cephALEXin (KEFLEX) 500 MG capsule, Take 1 capsule (500 mg total) by mouth 3 (three) times daily., Disp: 27 capsule, Rfl: 1 .  finasteride (PROSCAR) 5 MG tablet, Take 5 mg by mouth daily., Disp: , Rfl:  .  gabapentin (NEURONTIN) 400 MG capsule, Take 400 mg by mouth. Patient takes 400mg  by mouth in the morning,400 mg by mouth in the afternoon, and 400-800 by mouth in the evening., Disp: , Rfl:  .  levETIRAcetam (KEPPRA) 750 MG tablet, Take 2 tablets (1,500 mg total) by mouth 2 (two) times daily., Disp: 120 tablet, Rfl: 5 .  pravastatin (PRAVACHOL) 40 MG tablet, Take 1 tablet (40 mg total) by mouth every evening., Disp: 90 tablet, Rfl: 3 .  tamsulosin (FLOMAX) 0.4 MG CAPS capsule, Take 0.4 mg by mouth every evening. , Disp: , Rfl:   Past Medical History: Past Medical History:  Diagnosis Date  . Enlarged prostate   . H/O: knee surgery    15   . History of open heart surgery    ASCENDING AORTIC ANEURYSM  . 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 Seizure disorder ([place]) 04/05/2017  .  Seizures ([place])   . Status post knee surgery    DVT POST KNEE SURGERY    Tobacco Use: History  Smoking Status  . Never Smoker  Smokeless Tobacco  . Never Used    Labs: Recent Review Flowsheet Data    Labs for ITP Cardiac and Pulmonary Rehab Latest Ref Rng & Units 12/13/2016   Cholestrol 100 - 199 mg/dL 199   LDLCALC 0 - 99 mg/dL 121(H)   HDL >39 mg/dL 45   Trlycerides 0 - 149 mg/dL 166(H)      Capillary Blood Glucose: No results found for: GLUCAP   Exercise Target Goals:    Exercise Program Goal: Individual exercise prescription set with THRR, safety & activity barriers. Participant demonstrates ability to understand and report RPE using BORG scale, to self-measure pulse accurately, and to acknowledge the importance of the exercise prescription.  Exercise Prescription Goal: Starting with aerobic activity 30 plus minutes a day, 3 days per week for initial exercise prescription. Provide home exercise prescription and guidelines that participant acknowledges understanding prior to discharge.  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 HR 126 bpm     Max Ex. BP 124/67     2 Minute Post BP 125/94         Oxygen Initial Assessment:   Oxygen Re-Evaluation:   Oxygen Discharge (Final Oxygen Re-Evaluation):   Initial Exercise Prescription:     Initial Exercise Prescription - 05/07/17 0900      Date of Initial Exercise RX and Referring Provider   Date 05/07/17   Referring Provider Candee Furbish MD     Bike   Level 2  upright scifit   Watts 30   Minutes 10   METs 3.18     NuStep   Level 3   SPM 80   Minutes 10   METs 2.5     Track   Laps 11   Minutes 10   METs 2.92     Prescription Details   Frequency (times per week) 3   Duration Progress to 30 minutes of continuous aerobic without signs/symptoms of physical distress     Intensity   THRR 40-80% of Max Heartrate 62-123   Ratings of Perceived Exertion 11-13   Perceived Dyspnea 0-4     Progression   Progression Continue to progress workloads to maintain intensity without signs/symptoms of physical distress.     Resistance Training   Training Prescription Yes   Weight 2lbs   Reps 10-15      Perform Capillary Blood Glucose checks as needed.  Exercise Prescription Changes:      Exercise Prescription Changes    Row Name 05/13/17 1239 05/21/17 1200 06/10/17 1600         Response to Exercise   Blood Pressure (Admit) 120/78 107/74 136/80     Blood Pressure (Exercise) 132/62 116/64 110/70     Blood Pressure (Exit) 104/69 106/72 117/76     Heart Rate (Admit) 97 bpm 86 bpm 78 bpm     Heart Rate (Exercise) 137 bpm 130 bpm 147 bpm     Heart Rate (Exit) 104 bpm 99 bpm 88 bpm     Rating of Perceived Exertion (Exercise) 12 13 13      Symptoms none none none     Duration Continue with 30 min of aerobic exercise without signs/symptoms of physical distress. Continue with 30 min of aerobic exercise without signs/symptoms of physical distress. Continue with 30 min of aerobic exercise without signs/symptoms of physical distress.     Intensity THRR unchanged THRR unchanged THRR unchanged       Progression    Progression Continue to progress workloads to maintain intensity without signs/symptoms of physical distress.  Continue to progress workloads to maintain intensity without signs/symptoms of physical distress. Continue to progress workloads to maintain intensity without signs/symptoms of physical distress.     Average METs 3.3 3.1 4.6       Resistance Training   Training Prescription Yes Yes Yes     Weight 2lbs 2lbs 6lbs     Reps 10-15 10-15 10-15     Time 10 Minutes 10 Minutes 10 Minutes       Bike   Level 2 3  upright scifit 3.5  upright scifit     Watts 25 25  -     Minutes 10 10 10      METs 2.9 2.8 5.3       NuStep   Level 3 4 4      SPM 100 85 100     Minutes 10 10 10      METs 3.8 3.3 4.3       Track   Laps 12 13 21      Minutes 10 10 10      METs 3.09 3.26 4.13       Home Exercise Plan   Plans to continue exercise at  -  - Home (comment)     Frequency  -  - Add 2 additional days to program exercise sessions.     Initial Home Exercises Provided  -  - 05/24/17        Exercise Comments:      Exercise Comments    Row Name 05/28/17 1434 06/18/17 1500         Exercise Comments Reviewed METs and goals. Pt is tolerating exercise very well; will continue to monitor pt's progress and activity levels.  Reviewed METs and goals. Pt is tolerating exercise very well; will continue to monitor pt's progress and activity levels.          Exercise Goals and Review:      Exercise Goals    Row Name 05/07/17 1011             Exercise Goals   Increase Physical Activity Yes       Intervention Provide advice, education, support and counseling about physical activity/exercise needs.;Develop an individualized exercise prescription for aerobic and resistive training based on initial evaluation findings, risk stratification, comorbidities and participant's personal goals.       Expected Outcomes Achievement of increased cardiorespiratory fitness and enhanced flexibility, muscular  endurance and strength shown through measurements of functional capacity and personal statement of participant.       Increase Strength and Stamina Yes       Intervention Provide advice, education, support and counseling about physical activity/exercise needs.;Develop an individualized exercise prescription for aerobic and resistive training based on initial evaluation findings, risk stratification, comorbidities and participant's personal goals.       Expected Outcomes Achievement of increased cardiorespiratory fitness and enhanced flexibility, muscular endurance and strength shown through measurements of functional capacity and personal statement of participant.       Able to understand and use rate of perceived exertion (RPE) scale Yes       Intervention Provide education and explanation on how to use RPE scale       Expected Outcomes Short Term: Able to use RPE daily in rehab to express subjective intensity level;Long Term:  Able to use RPE to guide intensity level when exercising independently       Knowledge and understanding of Target Heart Rate Range (THRR) Yes  Intervention Provide education and explanation of THRR including how the numbers were predicted and where they are located for reference       Expected Outcomes Short Term: Able to state/look up THRR;Long Term: Able to use THRR to govern intensity when exercising independently;Short Term: Able to use daily as guideline for intensity in rehab       Able to check pulse independently Yes       Intervention Provide education and demonstration on how to check pulse in carotid and radial arteries.;Review the importance of being able to check your own pulse for safety during independent exercise       Expected Outcomes Short Term: Able to explain why pulse checking is important during independent exercise;Long Term: Able to check pulse independently and accurately       Understanding of Exercise Prescription Yes       Intervention Provide  education, explanation, and written materials on patient's individual exercise prescription       Expected Outcomes Short Term: Able to explain program exercise prescription;Long Term: Able to explain home exercise prescription to exercise independently          Exercise Goals Re-Evaluation :     Exercise Goals Re-Evaluation    Row Name 05/24/17 1425 05/28/17 1440 06/18/17 1500         Exercise Goal Re-Evaluation   Exercise Goals Review Increase Physical Activity;Able to understand and use rate of perceived exertion (RPE) scale;Knowledge and understanding of Target Heart Rate Range (THRR);Understanding of Exercise Prescription;Increase Strength and Stamina;Able to check pulse independently Increase Physical Activity;Able to understand and use rate of perceived exertion (RPE) scale;Knowledge and understanding of Target Heart Rate Range (THRR);Understanding of Exercise Prescription;Increase Strength and Stamina;Able to check pulse independently Increase Physical Activity;Able to understand and use rate of perceived exertion (RPE) scale;Knowledge and understanding of Target Heart Rate Range (THRR);Understanding of Exercise Prescription;Increase Strength and Stamina;Able to check pulse independently     Comments Reviewed home exercise with pt today.  Pt plans to ride stationary bike for exercise.  Reviewed THR, pulse, RPE, sign and symptoms, and when to call 911 or MD.  Also discussed weather considerations and indoor options.  Pt voiced understanding. Reviewed home exercise with pt today.  Pt plans to ride stationary bike for exercise.  Reviewed THR, pulse, RPE, sign and symptoms, and when to call 911 or MD.  Also discussed weather considerations and indoor options.  Pt voiced understanding. pt is walking 1 mile 3x/week in addition to stretching and performing household chores/duties.     Expected Outcomes Pt will be compliant with HEP and improve in cardiorespiratory fitness Pt will be compliant with  HEP and improve in cardiorespiratory fitness Pt will be compliant with HEP and improve in cardiorespiratory fitness         Discharge Exercise Prescription (Final Exercise Prescription Changes):     Exercise Prescription Changes - 06/10/17 1600      Response to Exercise   Blood Pressure (Admit) 136/80   Blood Pressure (Exercise) 110/70   Blood Pressure (Exit) 117/76   Heart Rate (Admit) 78 bpm   Heart Rate (Exercise) 147 bpm   Heart Rate (Exit) 88 bpm   Rating of Perceived Exertion (Exercise) 13   Symptoms none   Duration Continue with 30 min of aerobic exercise without signs/symptoms of physical distress.   Intensity THRR unchanged     Progression   Progression Continue to progress workloads to maintain intensity without signs/symptoms of physical distress.   Average  METs 4.6     Resistance Training   Training Prescription Yes   Weight 6lbs   Reps 10-15   Time 10 Minutes     Bike   Level 3.5  upright scifit   Minutes 10   METs 5.3     NuStep   Level 4   SPM 100   Minutes 10   METs 4.3     Track   Laps 21   Minutes 10   METs 4.13     Home Exercise Plan   Plans to continue exercise at Home (comment)   Frequency Add 2 additional days to program exercise sessions.   Initial Home Exercises Provided 05/24/17      Nutrition:  Target Goals: Understanding of nutrition guidelines, daily intake of sodium 1500mg , cholesterol 200mg , calories 30% from fat and 7% or less from saturated fats, daily to have 5 or more servings of fruits and vegetables.  Biometrics:     Pre Biometrics - 05/07/17 1011      Pre Biometrics   Waist Circumference 36 inches   Hip Circumference 38.75 inches   Waist to Hip Ratio 0.93 %   Triceps Skinfold 10 mm   % Body Fat 26.4 %   Grip Strength 29.5 kg   Flexibility 15.5 in   Single Leg Stand 15.21 seconds       Nutrition Therapy Plan and Nutrition Goals:     Nutrition Therapy & Goals - 05/15/17 1025      Nutrition Therapy    Diet Therapeutic Lifestyle Changes     Personal Nutrition Goals   Nutrition Goal Pt to identify food quantities necessary to achieve weight loss of 6-15 lb at graduation from cardiac rehab.      Intervention Plan   Intervention Prescribe, educate and counsel regarding individualized specific dietary modifications aiming towards targeted core components such as weight, hypertension, lipid management, diabetes, heart failure and other comorbidities.   Expected Outcomes Short Term Goal: Understand basic principles of dietary content, such as calories, fat, sodium, cholesterol and nutrients.;Long Term Goal: Adherence to prescribed nutrition plan.      Nutrition Discharge: Nutrition Scores:     Nutrition Assessments - 05/15/17 1024      MEDFICTS Scores   Pre Score 30      Nutrition Goals Re-Evaluation:   Nutrition Goals Re-Evaluation:   Nutrition Goals Discharge (Final Nutrition Goals Re-Evaluation):   Psychosocial: Target Goals: Acknowledge presence or absence of significant depression and/or stress, maximize coping skills, provide positive support system. Participant is able to verbalize types and ability to use techniques and skills needed for reducing stress and depression.  Initial Review & Psychosocial Screening:     Initial Psych Review & Screening - 05/07/17 0849      Initial Review   Current issues with Current Stress Concerns;Current Anxiety/Panic  health related anxiety   Source of Stress Concerns Chronic Illness;Unable to perform yard/household activities;Unable to participate in former interests or hobbies   Comments pt s/p redo CABG and CVA with deficits.       Family Dynamics   Good Support System? Yes  spouse, family, friends      Barriers   Psychosocial barriers to participate in program The patient should benefit from training in stress management and relaxation.     Screening Interventions   Interventions Encouraged to exercise      Quality of  Life Scores:     Quality of Life - 05/24/17 1527      Quality  of Life Scores   Health/Function Pre 20 %   Socioeconomic Pre 27.7 %   Psych/Spiritual Pre 24.86 %   Family Pre 25.2 %   GLOBAL Pre 23.49 %  QOL scores reviewed with pt.  pt admits biggest concern is functional decline from CVA deficits and recent cardiac event. pt eager to participate in rehab services to regain strength.  pt displays hopeful outlook with good coping skills.,       PHQ-9: Recent Review Flowsheet Data    Depression screen [facility] 2/9 05/13/2017   Decreased Interest 0   Down, Depressed, Hopeless 0   PHQ - 2 Score 0     Interpretation of Total Score  Total Score Depression Severity:  1-4 = Minimal depression, 5-9 = Mild depression, 10-14 = Moderate depression, 15-19 = Moderately severe depression, 20-27 = Severe depression   Psychosocial Evaluation and Intervention:     Psychosocial Evaluation - 05/13/17 1638      Psychosocial Evaluation & Interventions   Interventions Encouraged to exercise with the program and follow exercise prescription;Stress management education;Relaxation education   Comments pt with health related stress and anxiety. pt discouraged with the amount of disability that has resulted from his recent CVA and cardiac event.  pt looking forward to improving his health, funcational capaciity and wellbeing.    Expected Outcomes pt will exhibit positive outlook with good coping skills.    Continue Psychosocial Services  Follow up required by staff      Psychosocial Re-Evaluation:     Psychosocial Re-Evaluation    [place] Name 05/22/17 1052 06/19/17 0913           Psychosocial Re-Evaluation   Current issues with Current Stress Concerns;Current Anxiety/Panic Current Stress Concerns;Current Anxiety/Panic      Comments pt demonstrates decreased health related stress and anxiety with CR participation.  pt eager to improve funcational ability pt demonstrates decreased health related stress  and anxiety with CR participation.  pt eager to improve funcational ability      Expected Outcomes pt will exhibit positive outlook with good coping skills.  pt will exhibit positive outlook with good coping skills.       Interventions Encouraged to attend Cardiac Rehabilitation for the exercise;Stress management education;Relaxation education Encouraged to attend Cardiac Rehabilitation for the exercise;Stress management education;Relaxation education      Continue Psychosocial Services  No Follow up required No Follow up required      Comments pt s/p redo CABG and CVA with deficits.    -        Initial Review   Source of Stress Concerns Chronic Illness;Unable to perform yard/household activities;Unable to participate in former interests or hobbies Chronic Illness;Unable to perform yard/household activities;Unable to participate in former interests or hobbies         Psychosocial Discharge (Final Psychosocial Re-Evaluation):     Psychosocial Re-Evaluation - 06/19/17 0913      Psychosocial Re-Evaluation   Current issues with Current Stress Concerns;Current Anxiety/Panic   Comments pt demonstrates decreased health related stress and anxiety with CR participation.  pt eager to improve funcational ability   Expected Outcomes pt will exhibit positive outlook with good coping skills.    Interventions Encouraged to attend Cardiac Rehabilitation for the exercise;Stress management education;Relaxation education   Continue Psychosocial Services  No Follow up required     Initial Review   Source of Stress Concerns Chronic Illness;Unable to perform yard/household activities;Unable to participate in former interests or hobbies      Vocational Rehabilitation:  Provide vocational rehab assistance to qualifying candidates.   Vocational Rehab Evaluation & Intervention:     Vocational Rehab - 05/07/17 0848      Initial Vocational Rehab Evaluation & Intervention   Assessment shows need for  Vocational Rehabilitation No  retired       Education: Education Goals: Education classes will be provided on a weekly basis, covering required topics. Participant will state understanding/return demonstration of topics presented.  Learning Barriers/Preferences:     Learning Barriers/Preferences - 05/07/17 1014      Learning Barriers/Preferences   Learning Barriers Sight   Learning Preferences Written Material;Video;Verbal Instruction;Skilled Demonstration;Pictoral      Education Topics: Count Your Pulse:  -Group instruction provided by verbal instruction, demonstration, patient participation and written materials to support subject.  Instructors address importance of being able to find your pulse and how to count your pulse when at home without a heart monitor.  Patients get hands on experience counting their pulse with staff help and individually.   CARDIAC REHAB PHASE II EXERCISE from 06/21/2017 in [facility]  Date  06/21/17  Instruction Review Code  2- meets goals/outcomes      Heart Attack, Angina, and Risk Factor Modification:  -Group instruction provided by verbal instruction, video, and written materials to support subject.  Instructors address signs and symptoms of angina and heart attacks.    Also discuss risk factors for heart disease and how to make changes to improve heart health risk factors.   CARDIAC REHAB PHASE II EXERCISE from 06/21/2017 in [facility]  Date  06/19/17  Instruction Review Code  2- meets goals/outcomes      Functional Fitness:  -Group instruction provided by verbal instruction, demonstration, patient participation, and written materials to support subject.  Instructors address safety measures for doing things around the house.  Discuss how to get up and down off the floor, how to pick things up properly, how to safely get out of a chair without assistance, and balance training.    CARDIAC REHAB PHASE II EXERCISE from 06/21/2017 in [place]  Date  06/07/17  Instruction Review Code  2- meets goals/outcomes      Meditation and Mindfulness:  -Group instruction provided by verbal instruction, patient participation, and written materials to support subject.  Instructor addresses importance of mindfulness and meditation practice to help reduce stress and improve awareness.  Instructor also leads participants through a meditation exercise.    CARDIAC REHAB PHASE II EXERCISE from 06/21/2017 in [place]  Date  05/22/17  Instruction Review Code  2- meets goals/outcomes      Stretching for Flexibility and Mobility:  -Group instruction provided by verbal instruction, patient participation, and written materials to support subject.  Instructors lead participants through series of stretches that are designed to increase flexibility thus improving mobility.  These stretches are additional exercise for major muscle groups that are typically performed during regular warm up and cool down.   CARDIAC REHAB PHASE II EXERCISE from 06/21/2017 in [place]  Date  06/12/17  Instruction Review Code  2- meets goals/outcomes      Hands Only CPR:  -Group verbal, video, and participation provides a basic overview of AHA guidelines for community CPR. Role-play of emergencies allow participants the opportunity to practice calling for help and chest compression technique with discussion of AED use.   Hypertension: -Group verbal and written instruction that  provides a basic overview of hypertension including the most recent diagnostic guidelines, risk factor reduction with self-care instructions and medication management.   CARDIAC REHAB PHASE II EXERCISE from 06/21/2017 in [place]  Date  05/17/17  Instruction Review Code  2- meets goals/outcomes       Nutrition  I class: Heart Healthy Eating:  -Group instruction provided by PowerPoint slides, verbal discussion, and written materials to support subject matter. The instructor gives an explanation and review of the Therapeutic Lifestyle Changes diet recommendations, which includes a discussion on lipid goals, dietary fat, sodium, fiber, plant stanol/sterol esters, sugar, and the components of a well-balanced, healthy diet.   CARDIAC REHAB PHASE II EXERCISE from 06/21/2017 in [place]  Date  05/21/17  Educator  RD  Instruction Review Code  2- meets goals/outcomes      Nutrition II class: Lifestyle Skills:  -Group instruction provided by PowerPoint slides, verbal discussion, and written materials to support subject matter. The instructor gives an explanation and review of label reading, grocery shopping for heart health, heart healthy recipe modifications, and ways to make healthier choices when eating out.   CARDIAC REHAB PHASE II EXERCISE from 06/21/2017 in [place]  Date  06/04/17  Educator  RD  Instruction Review Code  2- meets goals/outcomes      Diabetes Question & Answer:  -Group instruction provided by PowerPoint slides, verbal discussion, and written materials to support subject matter. The instructor gives an explanation and review of diabetes co-morbidities, pre- and post-prandial blood glucose goals, pre-exercise blood glucose goals, signs, symptoms, and treatment of hypoglycemia and hyperglycemia, and foot care basics.   CARDIAC REHAB PHASE II EXERCISE from 06/21/2017 in [place]  Date  05/31/17  Educator  RD  Instruction Review Code  2- meets goals/outcomes      Diabetes Blitz:  -Group instruction provided by PowerPoint slides, verbal discussion, and written materials to support subject matter. The instructor gives an explanation and review of the physiology behind type 1 and type 2  diabetes, diabetes medications and rational behind using different medications, pre- and post-prandial blood glucose recommendations and Hemoglobin A1c goals, diabetes diet, and exercise including blood glucose guidelines for exercising safely.    Portion Distortion:  -Group instruction provided by PowerPoint slides, verbal discussion, written materials, and food models to support subject matter. The instructor gives an explanation of serving size versus portion size, changes in portions sizes over the last 20 years, and what consists of a serving from each food group.   CARDIAC REHAB PHASE II EXERCISE from 06/21/2017 in [place]  Date  06/05/17  Educator  RD  Instruction Review Code  2- meets goals/outcomes      Stress Management:  -Group instruction provided by verbal instruction, video, and written materials to support subject matter.  Instructors review role of stress in heart disease and how to cope with stress positively.     Exercising on Your Own:  -Group instruction provided by verbal instruction, power point, and written materials to support subject.  Instructors discuss benefits of exercise, components of exercise, frequency and intensity of exercise, and end points for exercise.  Also discuss use of nitroglycerin and activating EMS.  Review options of places to exercise outside of rehab.  Review guidelines for sex with heart disease.   Cardiac Drugs I:  -Group instruction provided by verbal instruction and written materials to  support subject.  Instructor reviews cardiac drug classes: antiplatelets, anticoagulants, beta blockers, and statins.  Instructor discusses reasons, side effects, and lifestyle considerations for each drug class.   Cardiac Drugs II:  -Group instruction provided by verbal instruction and written materials to support subject.  Instructor reviews cardiac drug classes: angiotensin converting enzyme inhibitors (ACE-I),  angiotensin II receptor blockers (ARBs), nitrates, and calcium channel blockers.  Instructor discusses reasons, side effects, and lifestyle considerations for each drug class.   CARDIAC REHAB PHASE II EXERCISE from 06/21/2017 in [place]  Date  05/29/17  Instruction Review Code  2- meets goals/outcomes      Anatomy and Physiology of the Circulatory System:  Group verbal and written instruction and models provide basic cardiac anatomy and physiology, with the coronary electrical and arterial systems. Review of: AMI, Angina, Valve disease, Heart Failure, Peripheral Artery Disease, Cardiac Arrhythmia, Pacemakers, and the ICD.   CARDIAC REHAB PHASE II EXERCISE from 06/21/2017 in [place]  Date  05/15/17  Educator  RN  Instruction Review Code  2- meets goals/outcomes      Other Education:  -Group or individual verbal, written, or video instructions that support the educational goals of the cardiac rehab program.   Knowledge Questionnaire Score:     Knowledge Questionnaire Score - 05/07/17 0848      Knowledge Questionnaire Score   Pre Score 23/24      Core Components/Risk Factors/Patient Goals at Admission:     Personal Goals and Risk Factors at Admission - 05/07/17 1031      Core Components/Risk Factors/Patient Goals on Admission   Admit Weight 175 lb 4.3 oz (79.5 kg)      Core Components/Risk Factors/Patient Goals Review:      Goals and Risk Factor Review    Row Name 05/22/17 1048 05/24/17 1532 06/19/17 0913 06/21/17 1139       Core Components/Risk Factors/Patient Goals Review   Personal Goals Review Hypertension;Improve shortness of breath with ADL's;Stress Hypertension;Improve shortness of breath with ADL's;Stress Hypertension;Improve shortness of breath with ADL's;Stress Hypertension;Improve shortness of breath with ADL's;Stress    Review pt with desire to maintain current weight and improve functional  symptoms. pt demonstrates eagerness to particpate in [place] activities.  BP well managed at CR.  pt with desire to maintain current weight and improve functional symptoms. pt demonstrates eagerness to particpate in [place] activities.  BP well managed at CR.  pt with desire to maintain current weight and improve functional symptoms. pt demonstrates eagerness to particpate in [place] activities.  BP well managed at CR.  pt with desire to maintain current weight and improve functional symptoms. pt reports decreased hand discomfort.  pt demonstrates eagerness to particpate in [place] activities.  BP well managed at [facility].     Expected Outcomes pt will participate in [facility] exercise, nutrition and lifestyle modification activities to decrease overall RF.  pt will participate in [facility] exercise, nutrition and lifestyle modification activities to decrease overall RF.  pt will participate in CR exercise, nutrition and lifestyle modification activities to decrease overall RF.  pt will participate in CR exercise, nutrition and lifestyle modification activities to decrease overall RF.        Core Components/Risk Factors/Patient Goals at Discharge (Final Review):      Goals and Risk Factor Review - 06/21/17 1139      Core Components/Risk Factors/Patient Goals Review   Personal Goals Review Hypertension;Improve shortness of breath with ADL's;Stress   Review pt with  desire to maintain current weight and improve functional symptoms. pt reports decreased hand discomfort.  pt demonstrates eagerness to particpate in [place] activities.  BP well managed at CR.    Expected Outcomes pt will participate in [facility] exercise, nutrition and lifestyle modification activities to decrease overall RF.       ITP Comments:     ITP Comments    Row Name 05/07/17 0750 05/28/17 1644 06/19/17 0913       ITP Comments Dr. Fransico Him, Medical Director  30day ITP review.  pt with good attendance and participation.  30day ITP review.  pt with good attendance and  participation.         Comments:

## 2017-06-21 NOTE — Telephone Encounter (Signed)
I'm calling for Erik Salazar. He had an ingrown toenail procedure bilateral great toes last Friday by Dr. Paulla Dolly. He has been doing what he is supposed to do, but one toe seems to be a little bit swollen and red. Please call me back at [phone]. Thank you.

## 2017-06-21 NOTE — Telephone Encounter (Signed)
I spoke to [place] and she stated they had been seen by Dr. Paulla Dolly today and has an antibiotic.

## 2017-06-24 ENCOUNTER — Encounter (HOSPITAL_COMMUNITY)
Admission: RE | Admit: 2017-06-24 | Discharge: 2017-06-24 | Disposition: A | Discharge status: Home / Self Care | Payer: Medicare HMO | Source: Ambulatory Visit | Attending: Cardiology | Admitting: Cardiology

## 2017-06-24 DIAGNOSIS — Z79899 Other long term (current) drug therapy: Secondary | ICD-10-CM | POA: Diagnosis not present

## 2017-06-24 DIAGNOSIS — G40909 Epilepsy, unspecified, not intractable, without status epilepticus: Secondary | ICD-10-CM | POA: Diagnosis not present

## 2017-06-24 DIAGNOSIS — Z7982 Long term (current) use of aspirin: Secondary | ICD-10-CM | POA: Diagnosis not present

## 2017-06-24 DIAGNOSIS — Z951 Presence of aortocoronary bypass graft: Secondary | ICD-10-CM

## 2017-06-25 ENCOUNTER — Telehealth (HOSPITAL_COMMUNITY): Payer: Self-pay

## 2017-06-25 NOTE — Progress Notes (Signed)
Subjective:    Patient ID: Erik Salazar, male   DOB: 66 y.o.   MRN: [id number]   HPI patient presents concerned about the right hallux nail stating it's been some redness in the border and is concerned about infection    ROS      Objective:  Physical Exam neurovascular status intact with patient noted to have mild redness medial aspect right hallux that is extending to the interphalangeal joint but no further proximal with no active odor or drainage noted     Assessment:   Possible localized paronychia infection left hallux      Plan:    Reviewed importance of soaks and bandage usage and as precautionary measure placed on cephalexin 500 mg and gave instructions on coming in immediately if any further redness or drainage were to occur or it should heal uneventfully

## 2017-06-25 NOTE — Telephone Encounter (Signed)
-----   Message from Jerline Pain, MD sent at 06/03/2017  2:30 PM EDT ----- Regarding: RE: HR increase I am fine with increase in target to 90% Candee Furbish, MD  ----- Message ----- From: Dorna Bloom D Sent: 05/31/2017   3:19 PM To: Jerline Pain, MD Subject: HR increase                                    Patient has been in cardiac rehab approximately 4 weeks and is doing well. BP's WNL's, but HR's are beginning to exceed Target Heart Rate (THR) of 62-123 bpm (40%-80% of age predicted max HR). If no GXT is planned for the near future and MD agrees, request to increase THR to 40-90% of age predicted max HR 62-139bpm.     Dorna Bloom, MS ACSM RCEP

## 2017-06-26 ENCOUNTER — Encounter (HOSPITAL_COMMUNITY)
Admission: RE | Admit: 2017-06-26 | Discharge: 2017-06-26 | Disposition: A | Discharge status: Home / Self Care | Payer: Medicare HMO | Source: Ambulatory Visit | Attending: Cardiology | Admitting: Cardiology

## 2017-06-26 DIAGNOSIS — Z951 Presence of aortocoronary bypass graft: Secondary | ICD-10-CM

## 2017-06-26 DIAGNOSIS — Z7982 Long term (current) use of aspirin: Secondary | ICD-10-CM | POA: Diagnosis not present

## 2017-06-26 DIAGNOSIS — G40909 Epilepsy, unspecified, not intractable, without status epilepticus: Secondary | ICD-10-CM | POA: Diagnosis not present

## 2017-06-26 DIAGNOSIS — Z79899 Other long term (current) drug therapy: Secondary | ICD-10-CM | POA: Diagnosis not present

## 2017-06-28 ENCOUNTER — Encounter (HOSPITAL_COMMUNITY)
Admission: RE | Admit: 2017-06-28 | Discharge: 2017-06-28 | Disposition: A | Discharge status: Home / Self Care | Payer: Medicare HMO | Source: Ambulatory Visit | Attending: Cardiology | Admitting: Cardiology

## 2017-06-28 DIAGNOSIS — Z951 Presence of aortocoronary bypass graft: Secondary | ICD-10-CM | POA: Diagnosis not present

## 2017-06-28 DIAGNOSIS — Z79899 Other long term (current) drug therapy: Secondary | ICD-10-CM | POA: Diagnosis not present

## 2017-06-28 DIAGNOSIS — Z7982 Long term (current) use of aspirin: Secondary | ICD-10-CM | POA: Diagnosis not present

## 2017-06-28 DIAGNOSIS — G40909 Epilepsy, unspecified, not intractable, without status epilepticus: Secondary | ICD-10-CM | POA: Diagnosis not present

## 2017-07-01 ENCOUNTER — Encounter (HOSPITAL_COMMUNITY)
Admission: RE | Admit: 2017-07-01 | Discharge: 2017-07-01 | Disposition: A | Discharge status: Home / Self Care | Payer: Medicare HMO | Source: Ambulatory Visit | Attending: Cardiology | Admitting: Cardiology

## 2017-07-01 DIAGNOSIS — Z79899 Other long term (current) drug therapy: Secondary | ICD-10-CM | POA: Diagnosis not present

## 2017-07-01 DIAGNOSIS — Z951 Presence of aortocoronary bypass graft: Secondary | ICD-10-CM

## 2017-07-01 DIAGNOSIS — Z7982 Long term (current) use of aspirin: Secondary | ICD-10-CM | POA: Diagnosis not present

## 2017-07-01 DIAGNOSIS — G40909 Epilepsy, unspecified, not intractable, without status epilepticus: Secondary | ICD-10-CM | POA: Diagnosis not present

## 2017-07-02 ENCOUNTER — Ambulatory Visit: Payer: Medicare HMO | Admitting: Cardiology

## 2017-07-03 ENCOUNTER — Encounter (HOSPITAL_COMMUNITY)
Admission: RE | Admit: 2017-07-03 | Discharge: 2017-07-03 | Disposition: A | Discharge status: Home / Self Care | Payer: Medicare HMO | Source: Ambulatory Visit | Attending: Cardiology | Admitting: Cardiology

## 2017-07-03 DIAGNOSIS — G40909 Epilepsy, unspecified, not intractable, without status epilepticus: Secondary | ICD-10-CM | POA: Diagnosis not present

## 2017-07-03 DIAGNOSIS — Z951 Presence of aortocoronary bypass graft: Secondary | ICD-10-CM

## 2017-07-03 DIAGNOSIS — Z7982 Long term (current) use of aspirin: Secondary | ICD-10-CM | POA: Diagnosis not present

## 2017-07-03 DIAGNOSIS — Z79899 Other long term (current) drug therapy: Secondary | ICD-10-CM | POA: Diagnosis not present

## 2017-07-05 ENCOUNTER — Encounter (HOSPITAL_COMMUNITY)
Admission: RE | Admit: 2017-07-05 | Discharge: 2017-07-05 | Disposition: A | Discharge status: Home / Self Care | Payer: Medicare HMO | Source: Ambulatory Visit | Attending: Cardiology | Admitting: Cardiology

## 2017-07-05 DIAGNOSIS — Z7982 Long term (current) use of aspirin: Secondary | ICD-10-CM | POA: Diagnosis not present

## 2017-07-05 DIAGNOSIS — G40909 Epilepsy, unspecified, not intractable, without status epilepticus: Secondary | ICD-10-CM | POA: Diagnosis not present

## 2017-07-05 DIAGNOSIS — Z79899 Other long term (current) drug therapy: Secondary | ICD-10-CM | POA: Diagnosis not present

## 2017-07-05 DIAGNOSIS — Z951 Presence of aortocoronary bypass graft: Secondary | ICD-10-CM | POA: Diagnosis not present

## 2017-07-08 ENCOUNTER — Ambulatory Visit (INDEPENDENT_AMBULATORY_CARE_PROVIDER_SITE_OTHER): Payer: Medicare HMO | Admitting: Neurology

## 2017-07-08 ENCOUNTER — Encounter: Payer: Self-pay | Admitting: Neurology

## 2017-07-08 ENCOUNTER — Encounter (HOSPITAL_COMMUNITY)
Admission: RE | Admit: 2017-07-08 | Discharge: 2017-07-08 | Disposition: A | Discharge status: Home / Self Care | Payer: Medicare HMO | Source: Ambulatory Visit | Attending: Cardiology | Admitting: Cardiology

## 2017-07-08 VITALS — BP 110/61 | HR 87 | Ht 70.0 in | Wt 173.0 lb

## 2017-07-08 DIAGNOSIS — G250 Essential tremor: Secondary | ICD-10-CM | POA: Diagnosis not present

## 2017-07-08 DIAGNOSIS — G5601 Carpal tunnel syndrome, right upper limb: Secondary | ICD-10-CM | POA: Insufficient documentation

## 2017-07-08 DIAGNOSIS — G40909 Epilepsy, unspecified, not intractable, without status epilepticus: Secondary | ICD-10-CM | POA: Diagnosis not present

## 2017-07-08 DIAGNOSIS — M79601 Pain in right arm: Secondary | ICD-10-CM | POA: Diagnosis not present

## 2017-07-08 DIAGNOSIS — Z951 Presence of aortocoronary bypass graft: Secondary | ICD-10-CM | POA: Diagnosis not present

## 2017-07-08 DIAGNOSIS — Z79899 Other long term (current) drug therapy: Secondary | ICD-10-CM | POA: Diagnosis not present

## 2017-07-08 DIAGNOSIS — Z7982 Long term (current) use of aspirin: Secondary | ICD-10-CM | POA: Diagnosis not present

## 2017-07-08 HISTORY — DX: Carpal tunnel syndrome, right upper limb: G56.01

## 2017-07-08 NOTE — Patient Instructions (Signed)
   Taper off of gabapentin 400 mg capsules, go to one twice a day for 2 weeks, then take one a day for 2 weeks, then stop.

## 2017-07-08 NOTE — Progress Notes (Signed)
Reason for visit: Right arm pain, seizures  Erik Salazar is an 66 y.o. male  History of present illness:  Erik Salazar is a 66 year old right-handed white male with a history of a cardiogenic stroke event, and some problems with right arm discomfort.  The patient underwent EMG and nerve conduction study that showed evidence of a C6 radiculopathy, MRI of the cervical spine did show narrowing of the neuroforamina that could involve the C6 nerve roots on both sides.  The patient has had gradual improvement of the pain in the right arm, he no longer has any pain.  He has some residual numbness of the thumb and index finger primarily, he does have mild carpal tunnel syndrome by nerve conduction study evaluation.  The patient reports no weakness of the extremities.  He has not had any recurrent seizures.  The patient is on gabapentin still taking 400 mg 3 times daily.  He remains on Keppra for the seizures.  The patient is in cardiac rehab, he does have some clumsiness of the right hand due to lack of sensation.  He returns to this office for an evaluation.  The patient continues to have some tremors of both upper extremities, but this has improved over time, he did have some tremors prior to surgery.  The tremors affect both arms.  Past Medical History:  Diagnosis Date  . Enlarged prostate   . H/O: knee surgery    15   . History of open heart surgery    ASCENDING AORTIC ANEURYSM  . 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 Seizure disorder ([place]) 04/05/2017  . Seizures ([place])   . Status post knee surgery    DVT POST KNEE SURGERY    Past Surgical History:  Procedure Laterality Date  . CARDIAC SURGERY     ANUERSYM MAY 2017   [place] procedure. 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 CORONARY ARTERY BYPASS GRAFT  03/13/2017   LIMA to LAD with steril abcess removal from dacron graft  . FALSE ANEURYSM REPAIR  03/13/2017   at [facility]   . KNEE SURGERY  1983  . open heart surgery     03-13-2017  . REPLACEMENT TOTAL KNEE  2015  . ROTATOR CUFF REPAIR      Family History  Problem Relation Age of Onset  . Aneurysm Mother        brain aneurysm for mother.     Social history:  reports that  has never smoked. he has never used smokeless tobacco. He reports that he does not drink alcohol or use drugs.    Allergies  Allergen Reactions  . Oxycodone Hcl     DELUSIONAL  . Oxycontin [Oxycodone]     DELUSIONAL   . Percocet [Oxycodone-Acetaminophen]     DELUSIONAL  . Sulfur Rash    Medications:  Prior to Admission medications   Medication Sig Start Date End Date Taking? Authorizing Provider  aspirin 325 MG tablet Take 325 mg by mouth daily.   Yes [provider]  finasteride (PROSCAR) 5 MG tablet Take 5 mg by mouth daily.   Yes [provider]  gabapentin (NEURONTIN) 400 MG capsule Take 400 mg by mouth. Patient takes 400mg  by mouth in the morning,400 mg by mouth in the afternoon, and 400-800  by mouth in the evening.   Yes [provider]  levETIRAcetam (KEPPRA) 750 MG tablet Take 2 tablets (1,500 mg total) by mouth 2 (two) times daily. 04/05/17 04/05/18 Yes Kathrynn Ducking, MD  pravastatin (PRAVACHOL) 40 MG tablet Take 1 tablet (40 mg total) by mouth every evening. 05/29/17 05/29/18 Yes Jerline Pain, MD  tamsulosin (FLOMAX) 0.4 MG CAPS capsule Take 0.4 mg by mouth every evening.    Yes [provider]    ROS:  Out of a complete 14 system review of symptoms, the patient complains only of the following symptoms, and all other reviewed systems are negative.  Joint pain, back pain Bruising easily Memory loss, tremors Decreased concentration  Blood pressure 110/61, pulse 87, height 5\' 10"  (1.778 m), weight 173 lb (78.5 kg).  Physical Exam  General: The patient is alert and  cooperative at the time of the examination.  Skin: No significant peripheral edema is noted.   Neurologic Exam  Mental status: The patient is alert and oriented x 3 at the time of the examination. The patient has apparent normal recent and remote memory, with an apparently normal attention span and concentration ability.   Cranial nerves: Facial symmetry is present. Speech is normal, no aphasia or dysarthria is noted. Extraocular movements are full. Visual fields are full.  Motor: The patient has good strength in all 4 extremities.  Sensory examination: Soft touch sensation is symmetric on the face, arms, and legs, with exception that there is some decreased sensation to soft touch on the right thumb as compared to the left.  Coordination: The patient has good finger-nose-finger and heel-to-shin bilaterally.  The patient is able to draw a spiral without significant tremor.  Gait and station: The patient has a normal gait. Tandem gait is normal. Romberg is negative. No drift is seen.  Reflexes: Deep tendon reflexes are symmetric.   MRI cervical 05/17/17:  IMPRESSION: Abnormal MRI scan of the cervical spine showing prominent spondylitic changes at C5-6 resulting in mild canal and moderate bilateral foraminal narrowing but likely impingement on the nerve roots.  * MRI scan images were reviewed online. I agree with the written report.   Assessment/Plan:  1.  Right arm pain, resolved  2.  Mild right carpal tunnel syndrome  3.  History of seizure following stroke  4.  Upper extremity tremor  The patient will be tapered off of his gabapentin 400 mg every 2 weeks until off the medication.  We will keep him on Keppra for now, we will consider initiating a gradual withdrawal from the [place] on his next revisit in 6 months.  He will contact our office if any new issues arise.  Tremors at this point do not appear to be significant, they may represent essential tremors as they were  present prior to surgery.  Jill Alexanders MD 07/08/2017 1:58 PM  [facility] [address] [place], [place] [address]  Phone [phone] Fax [phone]

## 2017-07-10 ENCOUNTER — Encounter (HOSPITAL_COMMUNITY)
Admission: RE | Admit: 2017-07-10 | Discharge: 2017-07-10 | Disposition: A | Discharge status: Home / Self Care | Payer: Medicare HMO | Source: Ambulatory Visit | Attending: Cardiology | Admitting: Cardiology

## 2017-07-10 DIAGNOSIS — Z79899 Other long term (current) drug therapy: Secondary | ICD-10-CM | POA: Diagnosis not present

## 2017-07-10 DIAGNOSIS — G40909 Epilepsy, unspecified, not intractable, without status epilepticus: Secondary | ICD-10-CM | POA: Diagnosis not present

## 2017-07-10 DIAGNOSIS — Z951 Presence of aortocoronary bypass graft: Secondary | ICD-10-CM | POA: Diagnosis not present

## 2017-07-10 DIAGNOSIS — Z7982 Long term (current) use of aspirin: Secondary | ICD-10-CM | POA: Diagnosis not present

## 2017-07-15 ENCOUNTER — Encounter (HOSPITAL_COMMUNITY)
Admission: RE | Admit: 2017-07-15 | Discharge: 2017-07-15 | Disposition: A | Discharge status: Home / Self Care | Payer: Medicare HMO | Source: Ambulatory Visit | Attending: Cardiology | Admitting: Cardiology

## 2017-07-15 DIAGNOSIS — G40909 Epilepsy, unspecified, not intractable, without status epilepticus: Secondary | ICD-10-CM | POA: Diagnosis not present

## 2017-07-15 DIAGNOSIS — Z7982 Long term (current) use of aspirin: Secondary | ICD-10-CM | POA: Diagnosis not present

## 2017-07-15 DIAGNOSIS — Z951 Presence of aortocoronary bypass graft: Secondary | ICD-10-CM | POA: Diagnosis not present

## 2017-07-15 DIAGNOSIS — Z79899 Other long term (current) drug therapy: Secondary | ICD-10-CM | POA: Diagnosis not present

## 2017-07-17 ENCOUNTER — Encounter (HOSPITAL_COMMUNITY)
Admission: RE | Admit: 2017-07-17 | Discharge: 2017-07-17 | Disposition: A | Discharge status: Home / Self Care | Payer: Medicare HMO | Source: Ambulatory Visit | Attending: Cardiology | Admitting: Cardiology

## 2017-07-17 DIAGNOSIS — Z951 Presence of aortocoronary bypass graft: Secondary | ICD-10-CM

## 2017-07-17 DIAGNOSIS — G40909 Epilepsy, unspecified, not intractable, without status epilepticus: Secondary | ICD-10-CM | POA: Diagnosis not present

## 2017-07-17 DIAGNOSIS — Z7982 Long term (current) use of aspirin: Secondary | ICD-10-CM | POA: Diagnosis not present

## 2017-07-17 DIAGNOSIS — Z79899 Other long term (current) drug therapy: Secondary | ICD-10-CM | POA: Diagnosis not present

## 2017-07-19 ENCOUNTER — Encounter (HOSPITAL_COMMUNITY)
Admission: RE | Admit: 2017-07-19 | Discharge: 2017-07-19 | Disposition: A | Discharge status: Home / Self Care | Payer: Medicare HMO | Source: Ambulatory Visit | Attending: Cardiology | Admitting: Cardiology

## 2017-07-19 VITALS — Ht 68.0 in | Wt 170.2 lb

## 2017-07-19 DIAGNOSIS — Z7982 Long term (current) use of aspirin: Secondary | ICD-10-CM | POA: Diagnosis not present

## 2017-07-19 DIAGNOSIS — Z951 Presence of aortocoronary bypass graft: Secondary | ICD-10-CM | POA: Diagnosis not present

## 2017-07-19 DIAGNOSIS — Z79899 Other long term (current) drug therapy: Secondary | ICD-10-CM | POA: Diagnosis not present

## 2017-07-19 DIAGNOSIS — G40909 Epilepsy, unspecified, not intractable, without status epilepticus: Secondary | ICD-10-CM | POA: Diagnosis not present

## 2017-07-19 NOTE — Progress Notes (Signed)
Cardiac Individual Treatment Plan  Patient Details  Name: Erik Salazar MRN: [id number] Date of Birth: 1950-09-20 Referring Provider:     CARDIAC REHAB PHASE II ORIENTATION from 05/07/2017 in [place]  Referring Provider  Candee Furbish MD      Initial Encounter Date:    CARDIAC REHAB PHASE II ORIENTATION from 05/07/2017 in [place]  Date  05/07/17  Referring Provider  Candee Furbish MD      Visit Diagnosis: S/P CABG x 1  Patient's Home Medications on Admission:  Current Outpatient Medications:  .  aspirin 325 MG tablet, Take 325 mg by mouth daily., Disp: , Rfl:  .  finasteride (PROSCAR) 5 MG tablet, Take 5 mg by mouth daily., Disp: , Rfl:  .  levETIRAcetam (KEPPRA) 750 MG tablet, Take 2 tablets (1,500 mg total) by mouth 2 (two) times daily., Disp: 120 tablet, Rfl: 5 .  pravastatin (PRAVACHOL) 40 MG tablet, Take 1 tablet (40 mg total) by mouth every evening., Disp: 90 tablet, Rfl: 3 .  tamsulosin (FLOMAX) 0.4 MG CAPS capsule, Take 0.4 mg by mouth every evening. , Disp: , Rfl:   Past Medical History: Past Medical History:  Diagnosis Date  . Carpal tunnel syndrome on right 07/08/2017  . Enlarged prostate   . H/O: knee surgery    15   . History of open heart surgery    ASCENDING AORTIC ANEURYSM  . 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 Seizure disorder ([place]) 04/05/2017  . Seizures ([place])   . Status post knee surgery    DVT POST KNEE SURGERY    Tobacco Use: Social History   Tobacco Use  Smoking Status Never Smoker  Smokeless Tobacco Never Used    Labs: Recent Review Scientist, physiological    Labs for ITP Cardiac and Pulmonary Rehab Latest Ref Rng & Units 12/13/2016   Cholestrol 100 - 199 mg/dL 199   LDLCALC 0 - 99 mg/dL 121(H)   HDL >39 mg/dL 45   Trlycerides 0 - 149 mg/dL 166(H)      Capillary Blood Glucose: No results found for: GLUCAP   Exercise Target Goals:    Exercise  Program Goal: Individual exercise prescription set with THRR, safety & activity barriers. Participant demonstrates ability to understand and report RPE using BORG scale, to self-measure pulse accurately, and to acknowledge the importance of the exercise prescription.  Exercise Prescription Goal: Starting with aerobic activity 30 plus minutes a day, 3 days per week for initial exercise prescription. Provide home exercise prescription and guidelines that participant acknowledges understanding prior to discharge.  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); seizures    Cardiac Risk Stratification  High       6 Minute Walk: 6 Minute Walk    Row Name 05/07/17 0953 07/19/17 1049       6 Minute Walk   Phase  Initial  Discharge    Distance  1560 feet  1819 feet    Distance % Change  -  16.6 %    Distance Feet Change  -  259 ft    Walk Time  6 minutes  6 minutes    # of Rest Breaks  0  0    MPH  2.95  3.4    METS  3.82  4.4    RPE  11  11    VO2 Peak  13.4  15.4    Symptoms  Yes (comment)  No    Comments  2/10 knee pain  -    Resting HR  97 bpm  87 bpm    Resting BP  117/64  114/82    Resting Oxygen Saturation   97 %  -    Exercise Oxygen Saturation  during 6 min walk  100 %  -    Max Ex. HR  126 bpm  147 bpm    Max Ex. BP  124/67  114/60    2 Minute Post BP  125/94  104/60       Oxygen Initial Assessment:   Oxygen Re-Evaluation:   Oxygen Discharge (Final Oxygen Re-Evaluation):   Initial Exercise Prescription: Initial Exercise Prescription - 05/07/17 0900      Date of Initial Exercise RX and Referring Provider   Date  05/07/17    Referring Provider  Candee Furbish MD      Bike   Level  2  upright scifit    Watts  30    Minutes  10    METs  3.18      NuStep   Level  3    SPM  80    Minutes  10    METs  2.5      Track   Laps  11    Minutes  10    METs  2.92      Prescription Details   Frequency (times per week)  3    Duration  Progress to 30 minutes of continuous aerobic without signs/symptoms of physical distress      Intensity   THRR 40-80% of Max Heartrate  62-123    Ratings of Perceived Exertion  11-13    Perceived Dyspnea  0-4      Progression   Progression  Continue to progress workloads to maintain intensity without signs/symptoms of physical distress.      Resistance Training   Training Prescription  Yes    Weight  2lbs    Reps  10-15       Perform Capillary Blood Glucose checks as needed.  Exercise Prescription Changes: Exercise Prescription Changes    Row Name 05/13/17 1239 05/21/17 1200 06/10/17 1600 07/01/17 1406 07/15/17 1441     Response to Exercise   Blood Pressure (Admit)  120/78  107/74  136/80  117/72  108/70   Blood Pressure (Exercise)  132/62  116/64  110/70  120/60  112/70   Blood Pressure (Exit)  104/69  106/72  117/76  94/60  130/80   Heart Rate (Admit)  97 bpm  86 bpm  78 bpm  83 bpm  82 bpm   Heart Rate (Exercise)  137 bpm  130 bpm  147 bpm  145 bpm  132 bpm   Heart Rate (Exit)  104 bpm  99 bpm  88 bpm  100 bpm  99 bpm   Rating of Perceived Exertion (Exercise)  12  13  13  12  12    Symptoms  none  none  none  none  none   Comments  -  -  -  -  pt does resisted cable column as an exercise station for 10 minutes   Duration  Continue with 30 min of aerobic exercise  without signs/symptoms of physical distress.  Continue with 30 min of aerobic exercise without signs/symptoms of physical distress.  Continue with 30 min of aerobic exercise without signs/symptoms of physical distress.  Continue with 30 min of aerobic exercise without signs/symptoms of physical distress.  Continue with 30 min of aerobic exercise without signs/symptoms  of physical distress.   Intensity  THRR unchanged  THRR unchanged  THRR unchanged  THRR unchanged  THRR unchanged     Progression   Progression  Continue to progress workloads to maintain intensity without signs/symptoms of physical distress.  Continue to progress workloads to maintain intensity without signs/symptoms of physical distress.  Continue to progress workloads to maintain intensity without signs/symptoms of physical distress.  Continue to progress workloads to maintain intensity without signs/symptoms of physical distress.  Continue to progress workloads to maintain intensity without signs/symptoms of physical distress.   Average METs  3.3  3.1  4.6  4.9  4.8     Resistance Training   Training Prescription  Yes  Yes  Yes  Yes  Yes   Weight  2lbs  2lbs  6lbs  8lbs  8lbs   Reps  10-15  10-15  10-15  10-15  10-15   Time  10 Minutes  10 Minutes  10 Minutes  10 Minutes  10 Minutes     Bike   Level  2  3 upright scifit  3.5 upright scifit  4 upright scifit  4 upright scifit   Watts  25  25  -  -  -   Minutes  10  10  10  20  10    METs  2.9  2.8  5.3  3.5  6     NuStep   Level  3  4  4  4  4    SPM  100  85  100  100  100   Minutes  10  10  10  10  10    METs  3.8  3.3  4.3  3.5  3.6     Track   Laps  12  13  21   -  -   Minutes  10  10  10   -  -   METs  3.09  3.26  4.13  -  -     Home Exercise Plan   Plans to continue exercise at  -  -  Home (comment)  Home (comment) walking  Home (comment) walking   Frequency  -  -  Add 2 additional days to program exercise sessions.  Add 2 additional days to program exercise sessions.  Add 2 additional days to program exercise sessions.   Initial Home Exercises Provided  -  -  05/24/17  05/24/17  05/24/17      Exercise Comments: Exercise Comments    Row Name 05/28/17 1434 06/18/17 1500 07/16/17 1444       Exercise Comments  Reviewed METs and goals. Pt is tolerating exercise very well; will continue to monitor pt's progress and activity  levels.   Reviewed METs and goals. Pt is tolerating exercise very well; will continue to monitor pt's progress and activity levels.   Reviewed METs and goals. Pt is tolerating exercise very well; will continue to monitor pt's progress and activity levels.         Exercise Goals and Review: Exercise Goals    Row Name 05/07/17 1011             Exercise Goals  Increase Physical Activity  Yes       Intervention  Provide advice, education, support and counseling about physical activity/exercise needs.;Develop an individualized exercise prescription for aerobic and resistive training based on initial evaluation findings, risk stratification, comorbidities and participant's personal goals.       Expected Outcomes  Achievement of increased cardiorespiratory fitness and enhanced flexibility, muscular endurance and strength shown through measurements of functional capacity and personal statement of participant.       Increase Strength and Stamina  Yes       Intervention  Provide advice, education, support and counseling about physical activity/exercise needs.;Develop an individualized exercise prescription for aerobic and resistive training based on initial evaluation findings, risk stratification, comorbidities and participant's personal goals.       Expected Outcomes  Achievement of increased cardiorespiratory fitness and enhanced flexibility, muscular endurance and strength shown through measurements of functional capacity and personal statement of participant.       Able to understand and use rate of perceived exertion (RPE) scale  Yes       Intervention  Provide education and explanation on how to use RPE scale       Expected Outcomes  Short Term: Able to use RPE daily in rehab to express subjective intensity level;Long Term:  Able to use RPE to guide intensity level when exercising independently       Knowledge and understanding of Target Heart Rate Range (THRR)  Yes       Intervention  Provide  education and explanation of THRR including how the numbers were predicted and where they are located for reference       Expected Outcomes  Short Term: Able to state/look up THRR;Long Term: Able to use THRR to govern intensity when exercising independently;Short Term: Able to use daily as guideline for intensity in rehab       Able to check pulse independently  Yes       Intervention  Provide education and demonstration on how to check pulse in carotid and radial arteries.;Review the importance of being able to check your own pulse for safety during independent exercise       Expected Outcomes  Short Term: Able to explain why pulse checking is important during independent exercise;Long Term: Able to check pulse independently and accurately       Understanding of Exercise Prescription  Yes       Intervention  Provide education, explanation, and written materials on patient's individual exercise prescription       Expected Outcomes  Short Term: Able to explain program exercise prescription;Long Term: Able to explain home exercise prescription to exercise independently          Exercise Goals Re-Evaluation : Exercise Goals Re-Evaluation    Row Name 05/24/17 1425 05/28/17 1440 06/18/17 1500 07/16/17 1442       Exercise Goal Re-Evaluation   Exercise Goals Review  Increase Physical Activity;Able to understand and use rate of perceived exertion (RPE) scale;Knowledge and understanding of Target Heart Rate Range (THRR);Understanding of Exercise Prescription;Increase Strength and Stamina;Able to check pulse independently  Increase Physical Activity;Able to understand and use rate of perceived exertion (RPE) scale;Knowledge and understanding of Target Heart Rate Range (THRR);Understanding of Exercise Prescription;Increase Strength and Stamina;Able to check pulse independently  Increase Physical Activity;Able to understand and use rate of perceived exertion (RPE) scale;Knowledge and understanding of Target  Heart Rate Range (THRR);Understanding of Exercise Prescription;Increase Strength and Stamina;Able to check pulse independently  Increase Physical Activity;Able to understand and  use rate of perceived exertion (RPE) scale;Knowledge and understanding of Target Heart Rate Range (THRR);Understanding of Exercise Prescription;Increase Strength and Stamina;Able to check pulse independently    Comments  Reviewed home exercise with pt today.  Pt plans to ride stationary bike for exercise.  Reviewed THR, pulse, RPE, sign and symptoms, and when to call 911 or MD.  Also discussed weather considerations and indoor options.  Pt voiced understanding.  Reviewed home exercise with pt today.  Pt plans to ride stationary bike for exercise.  Reviewed THR, pulse, RPE, sign and symptoms, and when to call 911 or MD.  Also discussed weather considerations and indoor options.  Pt voiced understanding.  pt is walking 1 mile 3x/week in addition to stretching and performing household chores/duties.  Pt is compliant with HEP. Pt has also noticed an increase with strength since using weights at pulley system. Pt has also introduced core training to home exercise routine. Dicussed activity and exercise limitations. Pt voiced understanding.    Expected Outcomes  Pt will be compliant with HEP and improve in cardiorespiratory fitness  Pt will be compliant with HEP and improve in cardiorespiratory fitness  Pt will be compliant with HEP and improve in cardiorespiratory fitness  Pt will be compliant with HEP, improve in cardiorespiratory fitness and understand exercise limitations.         Discharge Exercise Prescription (Final Exercise Prescription Changes): Exercise Prescription Changes - 07/15/17 1441      Response to Exercise   Blood Pressure (Admit)  108/70    Blood Pressure (Exercise)  112/70    Blood Pressure (Exit)  130/80    Heart Rate (Admit)  82 bpm    Heart Rate (Exercise)  132 bpm    Heart Rate (Exit)  99 bpm    Rating of  Perceived Exertion (Exercise)  12    Symptoms  none    Comments  pt does resisted cable column as an exercise station for 10 minutes    Duration  Continue with 30 min of aerobic exercise without signs/symptoms of physical distress.    Intensity  THRR unchanged      Progression   Progression  Continue to progress workloads to maintain intensity without signs/symptoms of physical distress.    Average METs  4.8      Resistance Training   Training Prescription  Yes    Weight  8lbs    Reps  10-15    Time  10 Minutes      Bike   Level  4 upright scifit    Minutes  10    METs  6      NuStep   Level  4    SPM  100    Minutes  10    METs  3.6      Home Exercise Plan   Plans to continue exercise at  Home (comment) walking    Frequency  Add 2 additional days to program exercise sessions.    Initial Home Exercises Provided  05/24/17       Nutrition:  Target Goals: Understanding of nutrition guidelines, daily intake of sodium 1500mg , cholesterol 200mg , calories 30% from fat and 7% or less from saturated fats, daily to have 5 or more servings of fruits and vegetables.  Biometrics: Pre Biometrics - 07/19/17 1056      Pre Biometrics   % Body Fat  23.5 %      Post Biometrics - 07/19/17 1056       Post  Biometrics   % Body Fat  24.6 %       Nutrition Therapy Plan and Nutrition Goals: Nutrition Therapy & Goals - 05/15/17 1025      Nutrition Therapy   Diet  Therapeutic Lifestyle Changes      Personal Nutrition Goals   Nutrition Goal  Pt to identify food quantities necessary to achieve weight loss of 6-15 lb at graduation from cardiac rehab.       Intervention Plan   Intervention  Prescribe, educate and counsel regarding individualized specific dietary modifications aiming towards targeted core components such as weight, hypertension, lipid management, diabetes, heart failure and other comorbidities.    Expected Outcomes  Short Term Goal: Understand basic principles of  dietary content, such as calories, fat, sodium, cholesterol and nutrients.;Long Term Goal: Adherence to prescribed nutrition plan.       Nutrition Discharge: Nutrition Scores: Nutrition Assessments - 05/15/17 1024      MEDFICTS Scores   Pre Score  30       Nutrition Goals Re-Evaluation:   Nutrition Goals Re-Evaluation:   Nutrition Goals Discharge (Final Nutrition Goals Re-Evaluation):   Psychosocial: Target Goals: Acknowledge presence or absence of significant depression and/or stress, maximize coping skills, provide positive support system. Participant is able to verbalize types and ability to use techniques and skills needed for reducing stress and depression.  Initial Review & Psychosocial Screening: Initial Psych Review & Screening - 05/07/17 0849      Initial Review   Current issues with  Current Stress Concerns;Current Anxiety/Panic health related anxiety    Source of Stress Concerns  Chronic Illness;Unable to perform yard/household activities;Unable to participate in former interests or hobbies    Comments  pt s/p redo CABG and CVA with deficits.        Family Dynamics   Good Support System?  Yes spouse, family, friends       Barriers   Psychosocial barriers to participate in program  The patient should benefit from training in stress management and relaxation.      Screening Interventions   Interventions  Encouraged to exercise       Quality of Life Scores: Quality of Life - 05/24/17 1527      Quality of Life Scores   Health/Function Pre  20 %    Socioeconomic Pre  27.7 %    Psych/Spiritual Pre  24.86 %    Family Pre  25.2 %    GLOBAL Pre  23.49 % QOL scores reviewed with pt.  pt admits biggest concern is functional decline from CVA deficits and recent cardiac event. pt eager to participate in rehab services to regain strength.  pt displays hopeful outlook with good coping skills.,        PHQ-9: Recent Review Flowsheet Data    Depression screen [facility] 2/9  05/13/2017   Decreased Interest 0   Down, Depressed, Hopeless 0   PHQ - 2 Score 0     Interpretation of Total Score  Total Score Depression Severity:  1-4 = Minimal depression, 5-9 = Mild depression, 10-14 = Moderate depression, 15-19 = Moderately severe depression, 20-27 = Severe depression   Psychosocial Evaluation and Intervention: Psychosocial Evaluation - 05/13/17 1638      Psychosocial Evaluation & Interventions   Interventions  Encouraged to exercise with the program and follow exercise prescription;Stress management education;Relaxation education    Comments  pt with health related stress and anxiety. pt discouraged with the amount of disability that has resulted from his recent  CVA and cardiac event.  pt looking forward to improving his health, funcational capaciity and wellbeing.     Expected Outcomes  pt will exhibit positive outlook with good coping skills.     Continue Psychosocial Services   Follow up required by staff       Psychosocial Re-Evaluation: Psychosocial Re-Evaluation    [place] Name 05/22/17 1052 06/19/17 0913 07/19/17 1636         Psychosocial Re-Evaluation   Current issues with  Current Stress Concerns;Current Anxiety/Panic  Current Stress Concerns;Current Anxiety/Panic  Current Stress Concerns;Current Anxiety/Panic     Comments  pt demonstrates decreased health related stress and anxiety with CR participation.  pt eager to improve funcational ability  pt demonstrates decreased health related stress and anxiety with CR participation.  pt eager to improve funcational ability  pt demonstrates decreased health related stress and anxiety with CR participation.  pt eager to improve funcational ability     Expected Outcomes  pt will exhibit positive outlook with good coping skills.   pt will exhibit positive outlook with good coping skills.   pt will exhibit positive outlook with good coping skills.      Interventions  Encouraged to attend Cardiac Rehabilitation for  the exercise;Stress management education;Relaxation education  Encouraged to attend Cardiac Rehabilitation for the exercise;Stress management education;Relaxation education  Encouraged to attend Cardiac Rehabilitation for the exercise;Stress management education;Relaxation education     Continue Psychosocial Services   No Follow up required  No Follow up required  No Follow up required     Comments  pt s/p redo CABG and CVA with deficits.    -  -       Initial Review   Source of Stress Concerns  Chronic Illness;Unable to perform yard/household activities;Unable to participate in former interests or hobbies  Chronic Illness;Unable to perform yard/household activities;Unable to participate in former interests or hobbies  -        Psychosocial Discharge (Final Psychosocial Re-Evaluation): Psychosocial Re-Evaluation - 07/19/17 1636      Psychosocial Re-Evaluation   Current issues with  Current Stress Concerns;Current Anxiety/Panic    Comments  pt demonstrates decreased health related stress and anxiety with CR participation.  pt eager to improve funcational ability    Expected Outcomes  pt will exhibit positive outlook with good coping skills.     Interventions  Encouraged to attend Cardiac Rehabilitation for the exercise;Stress management education;Relaxation education    Continue Psychosocial Services   No Follow up required       Vocational Rehabilitation: Provide vocational rehab assistance to qualifying candidates.   Vocational Rehab Evaluation & Intervention: Vocational Rehab - 05/07/17 0848      Initial Vocational Rehab Evaluation & Intervention   Assessment shows need for Vocational Rehabilitation  No retired        Education: Education Goals: Education classes will be provided on a weekly basis, covering required topics. Participant will state understanding/return demonstration of topics presented.  Learning Barriers/Preferences: Learning Barriers/Preferences - 05/07/17 1014       Learning Barriers/Preferences   Learning Barriers  Sight    Learning Preferences  Written Material;Video;Verbal Instruction;Skilled Demonstration;Pictoral       Education Topics: Count Your Pulse:  -Group instruction provided by verbal instruction, demonstration, patient participation and written materials to support subject.  Instructors address importance of being able to find your pulse and how to count your pulse when at home without a heart monitor.  Patients get hands on experience counting their pulse with  staff help and individually.   CARDIAC REHAB PHASE II EXERCISE from 07/19/2017 in [place]  Date  06/21/17  Instruction Review Code  2- meets goals/outcomes      Heart Attack, Angina, and Risk Factor Modification:  -Group instruction provided by verbal instruction, video, and written materials to support subject.  Instructors address signs and symptoms of angina and heart attacks.    Also discuss risk factors for heart disease and how to make changes to improve heart health risk factors.   CARDIAC REHAB PHASE II EXERCISE from 07/19/2017 in [place]  Date  06/19/17  Instruction Review Code  2- meets goals/outcomes      Functional Fitness:  -Group instruction provided by verbal instruction, demonstration, patient participation, and written materials to support subject.  Instructors address safety measures for doing things around the house.  Discuss how to get up and down off the floor, how to pick things up properly, how to safely get out of a chair without assistance, and balance training.   CARDIAC REHAB PHASE II EXERCISE from 07/19/2017 in [place]  Date  07/05/17  Instruction Review Code  2- meets goals/outcomes      Meditation and Mindfulness:  -Group instruction provided by verbal instruction, patient participation, and written materials to support subject.   Instructor addresses importance of mindfulness and meditation practice to help reduce stress and improve awareness.  Instructor also leads participants through a meditation exercise.    CARDIAC REHAB PHASE II EXERCISE from 07/19/2017 in [place]  Date  05/22/17  Instruction Review Code  2- meets goals/outcomes      Stretching for Flexibility and Mobility:  -Group instruction provided by verbal instruction, patient participation, and written materials to support subject.  Instructors lead participants through series of stretches that are designed to increase flexibility thus improving mobility.  These stretches are additional exercise for major muscle groups that are typically performed during regular warm up and cool down.   CARDIAC REHAB PHASE II EXERCISE from 07/19/2017 in [place]  Date  07/19/17  Instruction Review Code  2- meets goals/outcomes      Hands Only CPR:  -Group verbal, video, and participation provides a basic overview of AHA guidelines for community CPR. Role-play of emergencies allow participants the opportunity to practice calling for help and chest compression technique with discussion of AED use.   Hypertension: -Group verbal and written instruction that provides a basic overview of hypertension including the most recent diagnostic guidelines, risk factor reduction with self-care instructions and medication management.   CARDIAC REHAB PHASE II EXERCISE from 07/19/2017 in [place]  Date  05/17/17  Instruction Review Code  2- meets goals/outcomes       Nutrition I class: Heart Healthy Eating:  -Group instruction provided by PowerPoint slides, verbal discussion, and written materials to support subject matter. The instructor gives an explanation and review of the Therapeutic Lifestyle Changes diet recommendations, which includes a discussion on lipid goals, dietary fat,  sodium, fiber, plant stanol/sterol esters, sugar, and the components of a well-balanced, healthy diet.   CARDIAC REHAB PHASE II EXERCISE from 07/19/2017 in [place]  Date  05/21/17  Educator  RD  Instruction Review Code  2- meets goals/outcomes      Nutrition II class: Lifestyle Skills:  -Group instruction provided by PowerPoint slides, verbal discussion, and  written materials to support subject matter. The instructor gives an explanation and review of label reading, grocery shopping for heart health, heart healthy recipe modifications, and ways to make healthier choices when eating out.   CARDIAC REHAB PHASE II EXERCISE from 07/19/2017 in [place]  Date  06/04/17  Educator  RD  Instruction Review Code  2- meets goals/outcomes      Diabetes Question & Answer:  -Group instruction provided by PowerPoint slides, verbal discussion, and written materials to support subject matter. The instructor gives an explanation and review of diabetes co-morbidities, pre- and post-prandial blood glucose goals, pre-exercise blood glucose goals, signs, symptoms, and treatment of hypoglycemia and hyperglycemia, and foot care basics.   CARDIAC REHAB PHASE II EXERCISE from 07/19/2017 in [place]  Date  05/31/17  Educator  RD  Instruction Review Code  2- meets goals/outcomes      Diabetes Blitz:  -Group instruction provided by PowerPoint slides, verbal discussion, and written materials to support subject matter. The instructor gives an explanation and review of the physiology behind type 1 and type 2 diabetes, diabetes medications and rational behind using different medications, pre- and post-prandial blood glucose recommendations and Hemoglobin A1c goals, diabetes diet, and exercise including blood glucose guidelines for exercising safely.    Portion Distortion:  -Group instruction provided by PowerPoint  slides, verbal discussion, written materials, and food models to support subject matter. The instructor gives an explanation of serving size versus portion size, changes in portions sizes over the last 20 years, and what consists of a serving from each food group.   CARDIAC REHAB PHASE II EXERCISE from 07/19/2017 in [place]  Date  06/05/17  Educator  RD  Instruction Review Code  2- meets goals/outcomes      Stress Management:  -Group instruction provided by verbal instruction, video, and written materials to support subject matter.  Instructors review role of stress in heart disease and how to cope with stress positively.     Exercising on Your Own:  -Group instruction provided by verbal instruction, power point, and written materials to support subject.  Instructors discuss benefits of exercise, components of exercise, frequency and intensity of exercise, and end points for exercise.  Also discuss use of nitroglycerin and activating EMS.  Review options of places to exercise outside of rehab.  Review guidelines for sex with heart disease.   CARDIAC REHAB PHASE II EXERCISE from 07/19/2017 in [place]  Date  06/26/17  Instruction Review Code  2- meets goals/outcomes      Cardiac Drugs I:  -Group instruction provided by verbal instruction and written materials to support subject.  Instructor reviews cardiac drug classes: antiplatelets, anticoagulants, beta blockers, and statins.  Instructor discusses reasons, side effects, and lifestyle considerations for each drug class.   CARDIAC REHAB PHASE II EXERCISE from 07/19/2017 in [place]  Date  07/03/17  Instruction Review Code  2- meets goals/outcomes      Cardiac Drugs II:  -Group instruction provided by verbal instruction and written materials to support subject.  Instructor reviews cardiac drug classes: angiotensin converting enzyme  inhibitors (ACE-I), angiotensin II receptor blockers (ARBs), nitrates, and calcium channel blockers.  Instructor discusses reasons, side effects, and lifestyle considerations for each drug class.   CARDIAC REHAB PHASE II EXERCISE from 07/19/2017 in [place]  Date  05/29/17  Instruction Review Code  2- meets goals/outcomes      Anatomy and Physiology of the Circulatory System:  Group verbal and written instruction and models provide basic cardiac anatomy and physiology, with the coronary electrical and arterial systems. Review of: AMI, Angina, Valve disease, Heart Failure, Peripheral Artery Disease, Cardiac Arrhythmia, Pacemakers, and the ICD.   CARDIAC REHAB PHASE II EXERCISE from 07/19/2017 in [place]  Date  07/17/17  Educator  RN  Instruction Review Code  2- meets goals/outcomes      Other Education:  -Group or individual verbal, written, or video instructions that support the educational goals of the cardiac rehab program.   CARDIAC REHAB PHASE II EXERCISE from 07/19/2017 in [place]  Date  06/28/17 [[id number] Eating]  Educator  RD  Instruction Review Code  2- Demonstrated Understanding      Knowledge Questionnaire Score: Knowledge Questionnaire Score - 05/07/17 0848      Knowledge Questionnaire Score   Pre Score  23/24       Core Components/Risk Factors/Patient Goals at Admission: Personal Goals and Risk Factors at Admission - 05/07/17 1031      Core Components/Risk Factors/Patient Goals on Admission   Admit Weight  175 lb 4.3 oz (79.5 kg)       Core Components/Risk Factors/Patient Goals Review:  Goals and Risk Factor Review    Row Name 05/22/17 1048 05/24/17 1532 06/19/17 0913 06/21/17 1139 07/19/17 1636     Core Components/Risk Factors/Patient Goals Review   Personal Goals Review  Hypertension;Improve shortness of breath with ADL's;Stress  Hypertension;Improve  shortness of breath with ADL's;Stress  Hypertension;Improve shortness of breath with ADL's;Stress  Hypertension;Improve shortness of breath with ADL's;Stress  Hypertension;Improve shortness of breath with ADL's;Stress   Review  pt with desire to maintain current weight and improve functional symptoms. pt demonstrates eagerness to particpate in [place] activities.  BP well managed at CR.   pt with desire to maintain current weight and improve functional symptoms. pt demonstrates eagerness to particpate in [place] activities.  BP well managed at CR.   pt with desire to maintain current weight and improve functional symptoms. pt demonstrates eagerness to particpate in [place] activities.  BP well managed at CR.   pt with desire to maintain current weight and improve functional symptoms. pt reports decreased hand discomfort.  pt demonstrates eagerness to particpate in [place] activities.  BP well managed at CR.   pt with desire to maintain current weight and improve functional symptoms. pt reports increased strength/stamina.  pt demonstrates eagerness to particpate in [place] activities.  BP well managed at CR.    Expected Outcomes  pt will participate in CR exercise, nutrition and lifestyle modification activities to decrease overall RF.   pt will participate in CR exercise, nutrition and lifestyle modification activities to decrease overall RF.   pt will participate in CR exercise, nutrition and lifestyle modification activities to decrease overall RF.   pt will participate in CR exercise, nutrition and lifestyle modification activities to decrease overall RF.   pt will participate in CR exercise, nutrition and lifestyle modification activities to decrease overall RF.       Core Components/Risk Factors/Patient Goals at Discharge (Final Review):  Goals and Risk Factor Review - 07/19/17 1636      Core Components/Risk Factors/Patient Goals Review   Personal Goals Review  Hypertension;Improve shortness of breath with ADL's;Stress     Review  pt with desire to maintain current weight and improve functional symptoms. pt reports  increased strength/stamina.  pt demonstrates eagerness to particpate in [place] activities.  BP well managed at CR.     Expected Outcomes  pt will participate in CR exercise, nutrition and lifestyle modification activities to decrease overall RF.        ITP Comments: ITP Comments    Row Name 05/07/17 0750 05/28/17 1644 06/19/17 0913 07/19/17 1635     ITP Comments  Dr. Fransico Him, Medical Director   30day ITP review.  pt with good attendance and participation.   30day ITP review.  pt with good attendance and participation.   30day ITP review.  pt with good attendance and participation.        Comments:

## 2017-07-19 NOTE — Addendum Note (Signed)
Encounter addended by: Dorna Bloom D on: 07/19/2017 10:56 AM  Actions taken: Visit Navigator Flowsheet section accepted

## 2017-07-22 ENCOUNTER — Telehealth: Payer: Self-pay | Admitting: Neurology

## 2017-07-22 ENCOUNTER — Encounter (HOSPITAL_COMMUNITY)
Admission: RE | Admit: 2017-07-22 | Discharge: 2017-07-22 | Disposition: A | Discharge status: Home / Self Care | Payer: Medicare HMO | Source: Ambulatory Visit | Attending: Cardiology | Admitting: Cardiology

## 2017-07-22 DIAGNOSIS — I69398 Other sequelae of cerebral infarction: Principal | ICD-10-CM

## 2017-07-22 DIAGNOSIS — Z7982 Long term (current) use of aspirin: Secondary | ICD-10-CM | POA: Insufficient documentation

## 2017-07-22 DIAGNOSIS — G40909 Epilepsy, unspecified, not intractable, without status epilepticus: Secondary | ICD-10-CM | POA: Diagnosis not present

## 2017-07-22 DIAGNOSIS — I69359 Hemiplegia and hemiparesis following cerebral infarction affecting unspecified side: Secondary | ICD-10-CM

## 2017-07-22 DIAGNOSIS — Z951 Presence of aortocoronary bypass graft: Secondary | ICD-10-CM | POA: Diagnosis not present

## 2017-07-22 DIAGNOSIS — Z79899 Other long term (current) drug therapy: Secondary | ICD-10-CM | POA: Diagnosis not present

## 2017-07-22 NOTE — Telephone Encounter (Signed)
I called patient.  The pain level has reduced with the right arm, he is ready for occupational therapy at this time.  I will get this set up.

## 2017-07-22 NOTE — Telephone Encounter (Signed)
Patient requesting referral for OT. Best call back [phone]

## 2017-07-23 DIAGNOSIS — R351 Nocturia: Secondary | ICD-10-CM | POA: Diagnosis not present

## 2017-07-23 DIAGNOSIS — N401 Enlarged prostate with lower urinary tract symptoms: Secondary | ICD-10-CM | POA: Diagnosis not present

## 2017-07-24 ENCOUNTER — Encounter (HOSPITAL_COMMUNITY)
Admission: RE | Admit: 2017-07-24 | Discharge: 2017-07-24 | Disposition: A | Discharge status: Home / Self Care | Payer: Medicare HMO | Source: Ambulatory Visit | Attending: Cardiology | Admitting: Cardiology

## 2017-07-24 DIAGNOSIS — Z79899 Other long term (current) drug therapy: Secondary | ICD-10-CM | POA: Diagnosis not present

## 2017-07-24 DIAGNOSIS — G40909 Epilepsy, unspecified, not intractable, without status epilepticus: Secondary | ICD-10-CM | POA: Diagnosis not present

## 2017-07-24 DIAGNOSIS — Z7982 Long term (current) use of aspirin: Secondary | ICD-10-CM | POA: Diagnosis not present

## 2017-07-24 DIAGNOSIS — Z951 Presence of aortocoronary bypass graft: Secondary | ICD-10-CM

## 2017-07-26 ENCOUNTER — Encounter (HOSPITAL_COMMUNITY)
Admission: RE | Admit: 2017-07-26 | Discharge: 2017-07-26 | Disposition: A | Discharge status: Home / Self Care | Payer: Medicare HMO | Source: Ambulatory Visit | Attending: Cardiology | Admitting: Cardiology

## 2017-07-26 DIAGNOSIS — Z7982 Long term (current) use of aspirin: Secondary | ICD-10-CM | POA: Diagnosis not present

## 2017-07-26 DIAGNOSIS — Z951 Presence of aortocoronary bypass graft: Secondary | ICD-10-CM

## 2017-07-26 DIAGNOSIS — Z79899 Other long term (current) drug therapy: Secondary | ICD-10-CM | POA: Diagnosis not present

## 2017-07-26 DIAGNOSIS — G40909 Epilepsy, unspecified, not intractable, without status epilepticus: Secondary | ICD-10-CM | POA: Diagnosis not present

## 2017-07-29 ENCOUNTER — Encounter (HOSPITAL_COMMUNITY): Payer: Medicare HMO

## 2017-07-31 ENCOUNTER — Encounter (HOSPITAL_COMMUNITY)
Admission: RE | Admit: 2017-07-31 | Discharge: 2017-07-31 | Disposition: A | Discharge status: Home / Self Care | Payer: Medicare HMO | Source: Ambulatory Visit | Attending: Cardiology | Admitting: Cardiology

## 2017-07-31 DIAGNOSIS — Z951 Presence of aortocoronary bypass graft: Secondary | ICD-10-CM

## 2017-07-31 DIAGNOSIS — Z79899 Other long term (current) drug therapy: Secondary | ICD-10-CM | POA: Diagnosis not present

## 2017-07-31 DIAGNOSIS — Z7982 Long term (current) use of aspirin: Secondary | ICD-10-CM | POA: Diagnosis not present

## 2017-07-31 DIAGNOSIS — G40909 Epilepsy, unspecified, not intractable, without status epilepticus: Secondary | ICD-10-CM | POA: Diagnosis not present

## 2017-08-01 DIAGNOSIS — Z23 Encounter for immunization: Secondary | ICD-10-CM | POA: Diagnosis not present

## 2017-08-02 ENCOUNTER — Encounter (HOSPITAL_COMMUNITY)
Admission: RE | Admit: 2017-08-02 | Discharge: 2017-08-02 | Disposition: A | Discharge status: Home / Self Care | Payer: Medicare HMO | Source: Ambulatory Visit | Attending: Cardiology | Admitting: Cardiology

## 2017-08-02 DIAGNOSIS — Z951 Presence of aortocoronary bypass graft: Secondary | ICD-10-CM | POA: Diagnosis not present

## 2017-08-02 DIAGNOSIS — Z79899 Other long term (current) drug therapy: Secondary | ICD-10-CM | POA: Diagnosis not present

## 2017-08-02 DIAGNOSIS — Z7982 Long term (current) use of aspirin: Secondary | ICD-10-CM | POA: Diagnosis not present

## 2017-08-02 DIAGNOSIS — G40909 Epilepsy, unspecified, not intractable, without status epilepticus: Secondary | ICD-10-CM | POA: Diagnosis not present

## 2017-08-05 ENCOUNTER — Encounter (HOSPITAL_COMMUNITY): Payer: Self-pay

## 2017-08-05 ENCOUNTER — Telehealth: Payer: Self-pay | Admitting: Cardiology

## 2017-08-05 ENCOUNTER — Encounter (HOSPITAL_COMMUNITY)
Admission: RE | Admit: 2017-08-05 | Discharge: 2017-08-05 | Disposition: A | Discharge status: Home / Self Care | Payer: Medicare HMO | Source: Ambulatory Visit | Attending: Cardiology | Admitting: Cardiology

## 2017-08-05 DIAGNOSIS — Z951 Presence of aortocoronary bypass graft: Secondary | ICD-10-CM

## 2017-08-05 DIAGNOSIS — Z79899 Other long term (current) drug therapy: Secondary | ICD-10-CM | POA: Diagnosis not present

## 2017-08-05 DIAGNOSIS — G40909 Epilepsy, unspecified, not intractable, without status epilepticus: Secondary | ICD-10-CM | POA: Diagnosis not present

## 2017-08-05 DIAGNOSIS — Z7982 Long term (current) use of aspirin: Secondary | ICD-10-CM | POA: Diagnosis not present

## 2017-08-05 MED ORDER — METOPROLOL SUCCINATE 25 MG PO CS24: 12.5000 mg | EXTENDED_RELEASE_CAPSULE | Freq: Every day | ORAL | Status: DC

## 2017-08-05 NOTE — Telephone Encounter (Signed)
With start very low dose Toprol 12.5 mg once a day.  Had dizziness before. Thanks Candee Furbish, MD

## 2017-08-05 NOTE — Telephone Encounter (Signed)
Pt aware OK to restart 12.5 mg of succinate.  He will do so at bedtime.  He reports having an RX on hand at home and will restart this.  Medication list will be updated and pt will c/b if any questions or concerns.

## 2017-08-05 NOTE — Telephone Encounter (Signed)
Spoke with patient who is reporting having an elvated HR during exercise over the past week,  Up to 150 beats per min.  It returns to normal after resting in a dark room for some time.  Pt reports cardiac rehab suggested he call Dr Marlou Porch to see if he should restart his Metoprolol.  BP has been 120/80 to 110/?.  He denies any s/s.    He has 2 RXs for Metoprolol at home.  One is for succinate 25 mg tablets and the other is for 50 mg tablets of tartrate.  Advised I will forward this information to Dr Marlou Porch for review and will c/b with further recommendations/orders.  Pt states understanding and is in agreement.

## 2017-08-05 NOTE — Telephone Encounter (Signed)
New message     Pt c/o medication issue:  1. Name of Medication: metoprolol   2. How are you currently taking this medication (dosage and times per day)? metoprolol ER 25mg  or his metoprolol 50mg   3. Are you having a reaction (difficulty breathing--STAT)? no  4. What is your medication issue? Pt verbalized that he is calling to speak to rn because he want to go back on metoprolol ER 25mg  or his metoprolol 50mg  that was prescribed in the hospital   and he has two in the house and he want to know which one he can start back

## 2017-08-06 ENCOUNTER — Ambulatory Visit: Payer: Medicare HMO | Attending: Neurology | Admitting: Occupational Therapy

## 2017-08-06 ENCOUNTER — Encounter: Payer: Self-pay | Admitting: Occupational Therapy

## 2017-08-06 DIAGNOSIS — R208 Other disturbances of skin sensation: Secondary | ICD-10-CM | POA: Diagnosis present

## 2017-08-06 DIAGNOSIS — R251 Tremor, unspecified: Secondary | ICD-10-CM

## 2017-08-06 DIAGNOSIS — R278 Other lack of coordination: Secondary | ICD-10-CM

## 2017-08-06 DIAGNOSIS — M6281 Muscle weakness (generalized): Secondary | ICD-10-CM | POA: Diagnosis present

## 2017-08-06 NOTE — Therapy (Signed)
[facility] [address] [place], [place], [address] Phone: [phone]   Fax:  [phone]  Occupational Therapy Evaluation  Patient Details  Name: Erik Salazar MRN: [id number] Date of Birth: [birth year] No Data Recorded  Encounter Date: 08/06/2017  OT End of Session - 08/06/17 0905    Visit Number  1    Number of Visits  13    Date for OT Re-Evaluation  10/05/17    Authorization Type  [employer] 100% coverage, no visit limit, no auth req., needs G-code    Authorization - Visit Number  1    Authorization - Number of Visits  10    OT Start Time  0804    OT Stop Time  0845    OT Time Calculation (min)  41 min    Activity Tolerance  Patient tolerated treatment well    Behavior During Therapy  WFL for tasks assessed/performed       Past Medical History:  Diagnosis Date  . Carpal tunnel syndrome on right 07/08/2017  . Enlarged prostate   . H/O: knee surgery    15   . History of open heart surgery    ASCENDING AORTIC ANEURYSM  . 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 Seizure disorder ([place]) 04/05/2017  . Seizures ([place])   . Status post knee surgery    DVT POST KNEE SURGERY    Past Surgical History:  Procedure Laterality Date  . CARDIAC SURGERY     ANUERSYM MAY 2017   [place] procedure. 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 CORONARY ARTERY BYPASS GRAFT  03/13/2017   LIMA to LAD with steril abcess removal from dacron graft  . FALSE ANEURYSM REPAIR  03/13/2017   at [facility]   . KNEE SURGERY  1983  . open heart surgery     03-13-2017  . REPLACEMENT TOTAL KNEE  2015  . ROTATOR CUFF REPAIR      There were no vitals filed for this visit.  Subjective Assessment - 08/06/17 0809    Subjective   Pt reports that he was having trouble reading,  but 2 weeks ago he was able to read again.  Just finished cardiac rehab and plans to start going to gym with focus on cardio at [facility] or [employer].    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  Yes    Pain Score  1     Pain Location  Wrist    Pain Orientation  Right    Pain Descriptors / Indicators  Sharp;Stabbing    Pain Type  Chronic pain    Pain Frequency  Constant    Aggravating Factors   repetitive use    Pain Relieving Factors  rest    Effect of Pain on Daily Activities  OT will monitor as relates to therapy, but will no directly address due to chronic nature        [facility] OT Assessment - 08/06/17 0001      Assessment   Medical Diagnosis  CVA    Referring Provider  Dr. Margette Fast    Onset Date/Surgical Date  03/13/17 CABG with CVA after    Hand Dominance  Right    Prior Therapy  just finished cardiac rehab, no outpatient rehab for CTS or CVA      Precautions   Precautions  None      Balance Screen   Has the patient fallen in the past 6 months  No      Home  Environment   Family/patient expects to be discharged to:  Private residence    Lives With  Spouse      Prior Function   Level of [place]  Retired    Leisure  walks everyday, enjoys going to gym      ADL   Eating/Feeding  -- min difficulty cutting, min spills    Upper Body Dressing  Increased time for buttons    ADL comments  all BADLs mod I; difficulty with putting on watch,  difficulty getting things out of wallet      IADL   Light Housekeeping  -- independent for prior activities    Meal Prep  -- incr time, min A carrying hot/heavy items    Community Mobility  Drives own vehicle short distances, wife drives more    Medication Management  Is responsible for taking medication in correct dosages at correct time    Prior Level of Function Financial Management  pt performed    Financial Management  -- wife/pt shares, wife assists more with fatigue      Mobility   Mobility Status  Independent      Written Expression   Dominant Hand  Right    Handwriting  100% legible      Vision - History   Baseline Vision  Wears glasses only for reading    Additional Comments  reports mild blurriness with distance      Cognition   Overall Cognitive Status  Impaired/Different from baseline    Memory  Impaired    Memory Impairment  Decreased short term memory    Cognition Comments  pt reports delayed recall/slower thinking.  Initially could not read, but has been able to for last 2-3 weeks      Sensation   Additional Comments  Pt reports numbness/tingling in R 2nd digit and thumb, denies numbness in L hand      Coordination   9 Hole Peg Test  Right;Left    Right 9 Hole Peg Test  38.50    Left 9 Hole Peg Test  37.60    Tremors  bilateral tremors with action      ROM / Strength   AROM / PROM / Strength  AROM;Strength      AROM   Overall AROM   Within functional limits for tasks performed BUEs       Strength   Overall Strength  Within functional limits for tasks performed    Overall Strength Comments  BUE proximal strength grossly 5/5      Hand Function   Right Hand Grip (lbs)  66    Left Hand Grip (lbs)  48  OT Education - 08/06/17 1042    Education provided  Yes    Education Details  OT POC/eval results; Recommendation to wear splint at night if numbness incr or waking up pt and rationale and recommendation to wear splint  during the day with sustained gripping, lifting tasks    Person(s) Educated  Patient    Methods  Explanation    Comprehension  Verbalized understanding       OT Short Term Goals - 08/06/17 1610      OT SHORT TERM GOAL #1   Title  Pt will be independent with HEP for coordination.--check STGs 09/04/17    Time  4    Period  Weeks    Status  New      OT SHORT TERM GOAL #2   Title  Pt will verbalize understanding of activity modifications to decr pain in R hand.    Time  4    Period  Weeks    Status  New      OT SHORT TERM GOAL #3   Title  Pt will improve bilateral coordination for ADLs as shown by improving time on 9-hole peg test by at least 4 sec bilaterally.    Baseline  R-38.50sec, L-37.60sec    Time  4    Period  Weeks    Status  New      OT SHORT TERM GOAL #4   Title  Pt will verbalize understanding of compensation strategies for tremors prn.    Baseline  4    Period  Weeks    Status  New        OT Long Term Goals - 08/06/17 9604      OT LONG TERM GOAL #1   Title  Pt will be independent with HEP for conservative management of R CTS.--check LTGs 10/05/16    Time  6    Period  Weeks    Status  New      OT LONG TERM GOAL #2   Title  Pt will improve L grip strength by at least 8lbs for lifting/gripping tasks.    Baseline  R-66lbs, L-48lbs    Time  6    Period  Weeks    Status  New      OT LONG TERM GOAL #3   Title  Pt will improve bilateral coordination for ADLs as shown by improving time on 9-hole peg test by at least 8 sec bilaterally.    Baseline  R-38.50sec, L-37.60sec    Time  6    Period  Weeks    Status  New      OT LONG TERM GOAL #4   Title  Pt will report/demo incr ease of getting cards/money out of wallet using strategies prn.    Time  6    Period  Weeks    Status  New            Plan - 08/06/17 5409    Clinical Impression Statement  Pt is a 66 y.o. male who sustained CVA 03/13/17 s/p CABG.  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  Pt presents today with decr coordination, tremors, decr sensation, decr  strength resulting in difficulty with UE functional use.  Pt would benefit from occupational therapy to address these deficits for improved bilateraL UE control/use, and incr ease/effectiveness for ADL/IADL performance.    Occupational Profile and client history currently impacting functional performance  Pt is retired, but was independent prior to CVA.  Pt enjoys going to the gym.  Pt completed cardiac rehab yesterday.  Pt's goals are to be able to use bilateral hands for functional tasks with incr ease/control.     Occupational performance deficits (Please refer to evaluation for details):  ADL's;IADL's    Rehab Potential  Good    Current Impairments/barriers affecting progress:  R CTS    OT Frequency  2x / week    OT Duration  6 weeks +eval    OT Treatment/Interventions  Self-care/ADL training;Moist Heat;Fluidtherapy;DME and/or AE instruction;Splinting;Therapeutic activities;Therapeutic exercise;Cryotherapy;Paraffin;Neuromuscular education;Functional Mobility Training;Passive range of motion;Manual Therapy;Patient/family education;Ultrasound;Contrast Bath    Plan  coordination HEP bilateral hands, tendon glides/median nerve glide R hand     Clinical Decision Making  Limited treatment options, no task modification necessary    Recommended Other Services  completed Cardiac Rehab 08/05/17    Consulted and Agree with Plan of Care  Patient       Patient will benefit from skilled therapeutic intervention in order to improve the following deficits and impairments:  Impaired sensation, Decreased coordination, Decreased strength,  Impaired UE functional use, Decreased knowledge of use of DME  Visit Diagnosis: Other lack of coordination  Muscle weakness (generalized)  Other disturbances of skin sensation  Tremor  G-Codes - 08/12/17 0927    Functional Assessment Tool Used (Outpatient only)  9-hole peg test R-38.50sec, L-37.60sec    Functional Limitation  Carrying, moving and handling objects    Carrying, Moving and Handling Objects Current Status ([id number])  At least 20 percent but less than 40 percent impaired, limited or restricted    Carrying, Moving and Handling Objects Goal Status (L3810)  At least 1 percent but less than 20 percent impaired, limited or restricted       Problem List Patient Active Problem List   Diagnosis Date Noted  . Carpal tunnel syndrome on right 07/08/2017  . Tremor, essential 07/08/2017  . 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 Seizure disorder ([place]) 04/05/2017  . Right arm pain 04/05/2017    [facility] August 12, 2017, 10:43 AM  [facility] [address] [place], [place], [address] Phone: [phone]   Fax:  [phone]  Name: Erik Salazar MRN: [id number] Date of Birth: 15-Dec-1950   Vianne Bulls, OTR/L [facility] [address] [place], [place]  [address] [phone] phone [phone] 08-12-2017 10:43 AM

## 2017-08-07 ENCOUNTER — Encounter (HOSPITAL_COMMUNITY): Payer: Medicare HMO

## 2017-08-08 ENCOUNTER — Encounter: Payer: Self-pay | Admitting: Occupational Therapy

## 2017-08-08 ENCOUNTER — Ambulatory Visit: Payer: Medicare HMO | Admitting: Occupational Therapy

## 2017-08-08 DIAGNOSIS — R278 Other lack of coordination: Secondary | ICD-10-CM

## 2017-08-08 DIAGNOSIS — M6281 Muscle weakness (generalized): Secondary | ICD-10-CM

## 2017-08-08 DIAGNOSIS — R251 Tremor, unspecified: Secondary | ICD-10-CM

## 2017-08-08 DIAGNOSIS — R208 Other disturbances of skin sensation: Secondary | ICD-10-CM

## 2017-08-08 NOTE — Patient Instructions (Addendum)
  Coordination Activities  Perform the following activities for 20 minutes 1-2 times per day with right hand(s).   Rotate ball in fingertips (clockwise and counter-clockwise).  Flip cards 1 at a time as fast as you can.  Deal cards with your thumb (Hold deck in hand and push card off top with thumb).  Pick up coins and stack.  Pick up coins one at a time until you get 5-10 in your hand, then move coins from palm to fingertips to place in container/coin bank one at a time.  Practice writing   Rotate 2 golf balls in palm

## 2017-08-08 NOTE — Therapy (Signed)
[place] [facility] [address] [place], [place], [address] Phone: [phone]   Fax:  [phone]  Occupational Therapy Treatment  Patient Details  Name: Erik Salazar MRN: [id number] Date of Birth: 08/29/1950 No Data Recorded  Encounter Date: 08/08/2017  OT End of Session - 08/08/17 0826    Visit Number  2    Number of Visits  13    Date for OT Re-Evaluation  10/05/17    Authorization Type  [employer] Medicare 100% coverage, no visit limit, no auth req., needs G-code    Authorization - Visit Number  2    Authorization - Number of Visits  10    OT Start Time  0804    OT Stop Time  0845    OT Time Calculation (min)  41 min    Activity Tolerance  Patient tolerated treatment well    Behavior During Therapy  [facility] for tasks assessed/performed       Past Medical History:  Diagnosis Date  . Carpal tunnel syndrome on right 07/08/2017  . Enlarged prostate   . H/O: knee surgery    15   . History of open heart surgery    ASCENDING AORTIC ANEURYSM  . 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 Seizure disorder ([place]) 04/05/2017  . Seizures ([place])   . Status post knee surgery    DVT POST KNEE SURGERY    Past Surgical History:  Procedure Laterality Date  . CARDIAC SURGERY     ANUERSYM MAY 2017   Bentall procedure. 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 CORONARY ARTERY BYPASS GRAFT  03/13/2017   LIMA to LAD with steril abcess removal from dacron graft  . FALSE ANEURYSM REPAIR  03/13/2017   at [facility]   . KNEE SURGERY  1983  . open heart surgery     03-13-2017  . REPLACEMENT TOTAL KNEE  2015  . ROTATOR CUFF REPAIR      There were no vitals filed for this visit.  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  Yes    Pain Score  5     Pain Location  Wrist    Pain Orientation  Right    Pain Descriptors / Indicators  Stabbing    Pain Type  Chronic pain    Pain Onset  More than a month ago    Pain Frequency  Constant    Aggravating Factors   repetitive use    Pain Relieving Factors  rest  OT Education - 08/08/17 6063    Education Details  CTS HEP (Tendon Glides, Wrist Ext stretch, Median Nerve Glides); Coordination HEP (see pt instructions)    Person(s) Educated  Patient    Methods  Explanation;Demonstration;Verbal cues;Handout    Comprehension  Verbalized understanding;Returned demonstration;Verbal cues required;Need further instruction       OT Short Term Goals - 08/06/17 0160      OT SHORT TERM GOAL #1   Title  Pt will be independent with HEP for coordination.--check STGs 09/04/17    Time  4    Period  Weeks    Status  New      OT SHORT TERM GOAL #2   Title  Pt will verbalize understanding of activity modifications to decr pain in R hand.    Time  4    Period  Weeks    Status  New      OT SHORT TERM GOAL #3   Title  Pt will improve bilateral coordination for ADLs as shown by improving time on 9-hole peg test by at least 4 sec bilaterally.    Baseline  R-38.50sec, L-37.60sec    Time  4    Period  Weeks    Status  New      OT SHORT TERM GOAL #4   Title  Pt will  verbalize understanding of compensation strategies for tremors prn.    Baseline  4    Period  Weeks    Status  New        OT Long Term Goals - 08/06/17 1093      OT LONG TERM GOAL #1   Title  Pt will be independent with HEP for conservative management of R CTS.--check LTGs 10/05/16    Time  6    Period  Weeks    Status  New      OT LONG TERM GOAL #2   Title  Pt will improve L grip strength by at least 8lbs for lifting/gripping tasks.    Baseline  R-66lbs, L-48lbs    Time  6    Period  Weeks    Status  New      OT LONG TERM GOAL #3   Title  Pt will improve bilateral coordination for ADLs as shown by improving time on 9-hole peg test by at least 8 sec bilaterally.    Baseline  R-38.50sec, L-37.60sec    Time  6    Period  Weeks    Status  New      OT LONG TERM GOAL #4   Title  Pt will report/demo incr ease of getting cards/money out of wallet using strategies prn.    Time  6    Period  Weeks    Status  New            Plan - 08/08/17 0827    Clinical Impression Statement  Pt is progressing towards goals, but will need review of HEP due to pt reports of memory deficits.  Pt demo improvement with repetition.  However, pt also appears to demo "freezing" of movement at times.    Occupational Profile and client history currently impacting functional performance  Pt is retired, but was independent prior to CVA.  Pt enjoys going to the gym.  Pt completed cardiac rehab yesterday.  Pt's goals are to be able to use bilateral hands for functional tasks with incr ease/control.     Occupational performance deficits (Please refer to evaluation for details):  ADL's;IADL's  Rehab Potential  Good    Current Impairments/barriers affecting progress:  R CTS    OT Frequency  2x / week    OT Duration  6 weeks +eval    OT Treatment/Interventions  Self-care/ADL training;Moist Heat;Fluidtherapy;DME and/or AE instruction;Splinting;Therapeutic activities;Therapeutic  exercise;Cryotherapy;Paraffin;Neuromuscular education;Functional Mobility Training;Passive range of motion;Manual Therapy;Patient/family education;Ultrasound;Contrast Bath    Plan  [place]; review HEP    Clinical Decision Making  Limited treatment options, no task modification necessary    OT Home Exercise Plan  Education Provided:  Positions to avoid with CTS: CTS HEP (Tendon Glides, Wrist Ext stretch, Median Nerve Glides); Coordination HEP    Recommended Other Services  completed Cardiac Rehab 08/05/17    Consulted and Agree with Plan of Care  Patient       Patient will benefit from skilled therapeutic intervention in order to improve the following deficits and impairments:  Impaired sensation, Decreased coordination, Decreased strength, Impaired UE functional use, Decreased knowledge of use of DME  Visit Diagnosis: Other lack of coordination  Muscle weakness (generalized)  Other disturbances of skin sensation  Tremor    Problem List Patient Active Problem List   Diagnosis Date Noted  . Carpal tunnel syndrome on right 07/08/2017  . Tremor, essential 07/08/2017  . 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 Seizure disorder ([place]) 04/05/2017  . Right arm pain 04/05/2017    [facility] 08/08/2017, 1:42 PM  [place] [address] [place], [place], [address] Phone: [phone]   Fax:  [phone]  Name: Erik Salazar MRN: [id number] Date of Birth: 10/21/1950   Vianne Bulls, OTR/L [facility] [address] [place], [place]  [address] [phone] phone [phone] 08/08/17 1:42 PM

## 2017-08-09 ENCOUNTER — Encounter (HOSPITAL_COMMUNITY): Payer: Medicare HMO

## 2017-08-12 ENCOUNTER — Encounter (HOSPITAL_COMMUNITY): Payer: Medicare HMO

## 2017-08-14 ENCOUNTER — Encounter (HOSPITAL_COMMUNITY): Payer: Medicare HMO

## 2017-08-22 ENCOUNTER — Encounter: Payer: Self-pay | Admitting: Occupational Therapy

## 2017-08-22 ENCOUNTER — Ambulatory Visit: Payer: Medicare HMO | Attending: Neurology | Admitting: Occupational Therapy

## 2017-08-22 ENCOUNTER — Telehealth: Payer: Self-pay | Admitting: Cardiology

## 2017-08-22 DIAGNOSIS — R208 Other disturbances of skin sensation: Secondary | ICD-10-CM | POA: Diagnosis present

## 2017-08-22 DIAGNOSIS — M6281 Muscle weakness (generalized): Secondary | ICD-10-CM | POA: Diagnosis present

## 2017-08-22 DIAGNOSIS — R278 Other lack of coordination: Secondary | ICD-10-CM

## 2017-08-22 DIAGNOSIS — R251 Tremor, unspecified: Secondary | ICD-10-CM | POA: Diagnosis present

## 2017-08-22 DIAGNOSIS — R41841 Cognitive communication deficit: Secondary | ICD-10-CM | POA: Diagnosis present

## 2017-08-22 NOTE — Telephone Encounter (Signed)
Per wife - states pt's handicap placid runs out in February.  She is requesting another one.  I do not show documentation of Dr Marlou Porch giving one in the part.  Asked if pt is having any trouble with walking, SOB etc. To which wife reports the patient at times feels "wobbly" but no other s/s.  Advised I will review with Dr Marlou Porch however he may need to obtain placard from neuro d/t hx of CVA and no current cardiac issues.  She is aware I will c/b after reviewing with Dr Marlou Porch.

## 2017-08-22 NOTE — Therapy (Signed)
[place] [address] [place], [place], [address] Phone: [phone]   Fax:  [phone]   +  Occupational Therapy Treatment  Patient Details  Name: Erik Salazar MRN: [id number] Date of Birth: 09/02/1950 No Data Recorded  Encounter Date: 08/22/2017  OT End of Session - 08/22/17 0809    Visit Number  3    Number of Visits  13    Date for OT Re-Evaluation  10/05/17    Authorization Type  [employer] Medicare 100% coverage, no visit limit, no auth req., needs G-code    Authorization - Visit Number  3    Authorization - Number of Visits  10    OT Start Time  0804    OT Stop Time  0845    OT Time Calculation (min)  41 min    Activity Tolerance  Patient tolerated treatment well    Behavior During Therapy  WFL for tasks assessed/performed       Past Medical History:  Diagnosis Date  . Carpal tunnel syndrome on right 07/08/2017  . Enlarged prostate   . H/O: knee surgery    15   . History of open heart surgery    ASCENDING AORTIC ANEURYSM  . 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 Seizure disorder ([place]) 04/05/2017  . Seizures ([place])   . Status post knee surgery    DVT POST KNEE SURGERY    Past Surgical History:  Procedure Laterality Date  . CARDIAC SURGERY     ANUERSYM MAY 2017   Bentall procedure. 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 CORONARY ARTERY BYPASS GRAFT  03/13/2017   LIMA to LAD with steril abcess removal from dacron graft  . FALSE ANEURYSM REPAIR  03/13/2017   at [facility]   . KNEE SURGERY  1983  . open heart surgery     03-13-2017  . REPLACEMENT TOTAL KNEE  2015  . ROTATOR CUFF REPAIR      There were no vitals filed for this visit.  Subjective Assessment - 08/22/17 0807    Subjective   Pt reports more pain with exercises    Pertinent History  CVA (pt reports bilateral) following CABG 03/13/17, hx of aortic aneurysm repair and CABG x2 (2017), mild CTS, bilateral tremors/?hx of essential tremors, hx of seizure following CVA, disc damage in neck, hx of multiple orthopediac issues/surgeries including hx of 14 knee surgeries, hx of L knee replacement, hx of R bicep tendon repair, hx of R RTC repair, RA, OA,  (hx per Epic and pt report)    Limitations  hx of seizures per Epic    Patient Stated Goals  improve coordintation for fine motor/close up things    Currently in Pain?  Yes    Pain Score  5     Pain Location  Wrist    Pain Orientation  Right    Pain Descriptors / Indicators  Stabbing    Pain Type  Chronic pain    Pain Onset  More than a month ago    Pain Frequency  Constant    Aggravating Factors   repetitive use    Pain Relieving Factors  rest       Manual:  Gentle joint mobs to wrist within pt tolerance.  OT Treatments/Exercises (OP) - 08/22/17 0001      Modalities   Modalities  Fluidotherapy      RUE Fluidotherapy   Number Minutes Fluidotherapy  10 Minutes    RUE Fluidotherapy Location  Wrist;Hand    Comments  with no adverse reactions for pain/stiffness             OT Education - 08/22/17 1012    Education Details  Reviewed CTS HEP and coordination HEP    Person(s) Educated  Patient    Methods  Explanation;Demonstration;Verbal cues;Handout handout as a guide   handout as a guide   Comprehension  Verbalized understanding;Returned demonstration min cueing for sequencing   min cueing for sequencing      OT Short Term Goals - 08/06/17 4196      OT SHORT TERM GOAL #1   Title  Pt will be independent with HEP for coordination.--check STGs 09/04/17    Time  4    Period  Weeks    Status  New      OT SHORT TERM GOAL #2   Title  Pt will verbalize understanding of activity modifications to decr pain in R hand.    Time  4    Period  Weeks    Status  New      OT SHORT TERM GOAL  #3   Title  Pt will improve bilateral coordination for ADLs as shown by improving time on 9-hole peg test by at least 4 sec bilaterally.    Baseline  R-38.50sec, L-37.60sec    Time  4    Period  Weeks    Status  New      OT SHORT TERM GOAL #4   Title  Pt will verbalize understanding of compensation strategies for tremors prn.    Baseline  4    Period  Weeks    Status  New        OT Long Term Goals - 08/06/17 2229      OT LONG TERM GOAL #1   Title  Pt will be independent with HEP for conservative management of R CTS.--check LTGs 10/05/16    Time  6    Period  Weeks    Status  New      OT LONG TERM GOAL #2   Title  Pt will improve L grip strength by at least 8lbs for lifting/gripping tasks.    Baseline  R-66lbs, L-48lbs    Time  6    Period  Weeks    Status  New      OT LONG TERM GOAL #3   Title  Pt will improve bilateral coordination for ADLs as shown by improving time on 9-hole peg test by at least 8 sec bilaterally.    Baseline  R-38.50sec, L-37.60sec    Time  6    Period  Weeks    Status  New      OT LONG TERM GOAL #4   Title  Pt will report/demo incr ease of getting cards/money out of wallet using strategies prn.    Time  6    Period  Weeks    Status  New            Plan - 08/22/17 0809    Clinical Impression Statement  Pt demo incr pain with use and with combined wrist/thumb movement (positive Finkelstein test).  Pt may benefit from thumb spica splint.  However, pt demo less "freezing" and improved coordination today.  Occupational Profile and client history currently impacting functional performance  Pt is retired, but was independent prior to CVA.  Pt enjoys going to the gym.  Pt completed cardiac rehab yesterday.  Pt's goals are to be able to use bilateral hands for functional tasks with incr ease/control.     Occupational performance deficits (Please refer to evaluation for details):  ADL's;IADL's;Play    Rehab Potential  Good    Current  Impairments/barriers affecting progress:  R CTS    OT Frequency  2x / week    OT Duration  6 weeks +eval    OT Treatment/Interventions  Self-care/ADL training;Moist Heat;Fluidtherapy;DME and/or AE instruction;Splinting;Therapeutic activities;Therapeutic exercise;Cryotherapy;Paraffin;Neuromuscular education;Functional Mobility Training;Passive range of motion;Manual Therapy;Patient/family education;Ultrasound;Contrast Bath    Plan  ultrasound at volar wrist and base of thumb/radial wrist; fit pt for thumb spica splint for lifting/gripping tasks    Clinical Decision Making  Limited treatment options, no task modification necessary    OT Home Exercise Plan  Education Provided:  Positions to avoid with CTS: CTS HEP (Tendon Glides, Wrist Ext stretch, Median Nerve Glides); Coordination HEP    Recommended Other Services  completed Cardiac Rehab 08/05/17    Consulted and Agree with Plan of Care  Patient       Patient will benefit from skilled therapeutic intervention in order to improve the following deficits and impairments:  Impaired sensation, Decreased coordination, Decreased strength, Impaired UE functional use, Decreased knowledge of use of DME  Visit Diagnosis: Other lack of coordination  Muscle weakness (generalized)  Other disturbances of skin sensation  Tremor    Problem List Patient Active Problem List   Diagnosis Date Noted  . Carpal tunnel syndrome on right 07/08/2017  . Tremor, essential 07/08/2017  . 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 Seizure disorder ([place]) 04/05/2017  . Right arm pain 04/05/2017    [facility] 08/22/2017, 10:31 AM  [facility] [address] [place], [place], [address] Phone: [phone]   Fax:  [phone]  Name: Zaion Hreha MRN: [id number] Date of Birth: 1951/05/25   Vianne Bulls, OTR/L [facility] [address] [place], [place]   [address] [phone] phone [phone] 08/22/17 10:31 AM

## 2017-08-22 NOTE — Telephone Encounter (Signed)
Patient wife Raiford Noble) calling, states that patient needs to renew handicap placard and would like to discuss next steps.

## 2017-08-23 ENCOUNTER — Encounter: Payer: Self-pay | Admitting: Occupational Therapy

## 2017-08-23 ENCOUNTER — Ambulatory Visit: Payer: Medicare HMO | Admitting: Occupational Therapy

## 2017-08-23 DIAGNOSIS — R208 Other disturbances of skin sensation: Secondary | ICD-10-CM | POA: Diagnosis not present

## 2017-08-23 DIAGNOSIS — R278 Other lack of coordination: Secondary | ICD-10-CM

## 2017-08-23 DIAGNOSIS — R41841 Cognitive communication deficit: Secondary | ICD-10-CM | POA: Diagnosis not present

## 2017-08-23 DIAGNOSIS — R251 Tremor, unspecified: Secondary | ICD-10-CM | POA: Diagnosis not present

## 2017-08-23 DIAGNOSIS — M6281 Muscle weakness (generalized): Secondary | ICD-10-CM

## 2017-08-23 NOTE — Patient Instructions (Addendum)
Extension (Active With Finger Extension)    With forearm on table and wrist over edge, lift hand with fingers straight. Hold 5 seconds.  Keep thumb relaxed Repeat 5 times. Do 2sessions per day.    Copyright  VHI. All rights reserved.  WRIST: Ulnar Deviation No Gravity    Rest arm and hand on surface, palm down. Move wrist away from body.  Keep thumb still, then move hand back to the middle. 5 reps, hold 10sec.  2 times per day   Copyright  VHI. All rights reserved.  AROM: Thumb Flexion / Extension    Actively bend right thumb across palm as far as possible. Hold 3 seconds. Relax. Then pull thumb back into hitchhike position.  Keep wrist straight Repeat 5 times, 10sec. Do 2 sessions per day.     Radial Adduction/Abduction (Active)    Palm down on table.  Move thumb out to side. Move back alongside index finger.  Keep wrist straight Repeat 10 times. Do 2 sessions per day.

## 2017-08-23 NOTE — Therapy (Addendum)
[facility] [address] [place], [place], [address] Phone: [phone]   Fax:  [phone]  Occupational Therapy Treatment  Patient Details  Name: Erik Salazar MRN: [id number] Date of Birth: 1950-10-17 No Data Recorded  Encounter Date: 08/23/2017  OT End of Session - 08/23/17 0921    Visit Number  4    Number of Visits  13    Date for OT Re-Evaluation  10/05/17    Authorization Type  [employer] Medicare 100% coverage, no visit limit, no auth req., needs G-code    Authorization - Visit Number  4    Authorization - Number of Visits  10    OT Start Time  0803    OT Stop Time  0845    OT Time Calculation (min)  42 min    Activity Tolerance  Patient tolerated treatment well    Behavior During Therapy  [facility] for tasks assessed/performed       Past Medical History:  Diagnosis Date  . Carpal tunnel syndrome on right 07/08/2017  . Enlarged prostate   . H/O: knee surgery    15   . History of open heart surgery    ASCENDING AORTIC ANEURYSM  . 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 Seizure disorder ([place]) 04/05/2017  . Seizures ([place])   . Status post knee surgery    DVT POST KNEE SURGERY    Past Surgical History:  Procedure Laterality Date  . CARDIAC SURGERY     ANUERSYM MAY 2017   Bentall procedure. 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 CORONARY ARTERY BYPASS GRAFT  03/13/2017   LIMA to LAD with steril abcess removal from dacron graft  . FALSE ANEURYSM REPAIR  03/13/2017   at [facility]   . KNEE SURGERY  1983  . open heart surgery     03-13-2017  . REPLACEMENT TOTAL KNEE  2015  . ROTATOR CUFF REPAIR      There were no vitals filed for this visit.  Subjective Assessment - 08/23/17 0840    Subjective   my wife massaged my hand last night    Pertinent  History  CVA (pt reports bilateral) following CABG 03/13/17, hx of aortic aneurysm repair and CABG x2 (2017), mild CTS, bilateral tremors/?hx of essential tremors, hx of seizure following CVA, disc damage in neck, hx of multiple orthopediac issues/surgeries including hx of 14 knee surgeries, hx of L knee replacement, hx of R bicep tendon repair, hx of R RTC repair, RA, OA,  (hx per Epic and pt report)    Limitations  hx of seizures per Epic    Patient Stated Goals  improve coordintation for fine motor/close up things    Currently in Pain?  Yes    Pain Score  4     Pain Location  Wrist    Pain Orientation  Right    Pain Descriptors / Indicators  Stabbing    Pain Type  Chronic pain    Pain Onset  More than a month ago    Pain Frequency  Constant    Aggravating Factors   repetitive use, combined wrist/thumb movement    Pain Relieving Factors  rest  OT Treatments/Exercises (OP) - 08/23/17 0001      Modalities   Modalities  Ultrasound      Ultrasound   Ultrasound Location  R volar wrist and radial wrist/forearm    Ultrasound Parameters  88mhz, 50% pulsed, 0.8wts/cm2 x8 min with no adverse reactions    Ultrasound Goals  Pain      Manual Therapy   Manual Therapy  gentle joint mobs to wrist, wrist ext/flex PROM stretch, and soft tissue mobs to base of thumb/radial wrist/forearm       Splinting:  Pt fitted for forearm-based thumb spica splint for RUE (pre-fab) due to pain with wrist/thumb combined movements.  Pt instructed in splint wear/care and verbalized understanding.     OT Education - 08/23/17 [phone]    Education Details  Additions to HEP--see pt instructions    Person(s) Educated  Patient    Methods  Explanation;Demonstration;Verbal cues;Handout    Comprehension  Verbalized understanding;Returned demonstration;Verbal cues required       OT Short Term Goals - 08/06/17 9604      OT SHORT TERM GOAL #1   Title  Pt will be independent with HEP for  coordination.--check STGs 09/04/17    Time  4    Period  Weeks    Status  New      OT SHORT TERM GOAL #2   Title  Pt will verbalize understanding of activity modifications to decr pain in R hand.    Time  4    Period  Weeks    Status  New      OT SHORT TERM GOAL #3   Title  Pt will improve bilateral coordination for ADLs as shown by improving time on 9-hole peg test by at least 4 sec bilaterally.    Baseline  R-38.50sec, L-37.60sec    Time  4    Period  Weeks    Status  New      OT SHORT TERM GOAL #4   Title  Pt will verbalize understanding of compensation strategies for tremors prn.    Baseline  4    Period  Weeks    Status  New        OT Long Term Goals - 08/06/17 5409      OT LONG TERM GOAL #1   Title  Pt will be independent with HEP for conservative management of R CTS.--check LTGs 10/05/16    Time  6    Period  Weeks    Status  New      OT LONG TERM GOAL #2   Title  Pt will improve L grip strength by at least 8lbs for lifting/gripping tasks.    Baseline  R-66lbs, L-48lbs    Time  6    Period  Weeks    Status  New      OT LONG TERM GOAL #3   Title  Pt will improve bilateral coordination for ADLs as shown by improving time on 9-hole peg test by at least 8 sec bilaterally.    Baseline  R-38.50sec, L-37.60sec    Time  6    Period  Weeks    Status  New      OT LONG TERM GOAL #4   Title  Pt will report/demo incr ease of getting cards/money out of wallet using strategies prn.    Time  6    Period  Weeks    Status  New            Plan -  08/23/17 3536    Clinical Impression Statement  Pt progressing towards goals.  History of multiple injuries to R hand/wrist affect R hand functional use.    Occupational Profile and client history currently impacting functional performance  Pt is retired, but was independent prior to CVA.  Pt enjoys going to the gym.  Pt completed cardiac rehab yesterday.  Pt's goals are to be able to use bilateral hands for functional tasks  with incr ease/control.     Occupational performance deficits (Please refer to evaluation for details):  ADL's;IADL's;Play    Rehab Potential  Good    Current Impairments/barriers affecting progress:  R CTS    OT Frequency  2x / week    OT Duration  6 weeks +eval    OT Treatment/Interventions  Self-care/ADL training;Moist Heat;Fluidtherapy;DME and/or AE instruction;Splinting;Therapeutic activities;Therapeutic exercise;Cryotherapy;Paraffin;Neuromuscular education;Functional Mobility Training;Passive range of motion;Manual Therapy;Patient/family education;Ultrasound;Contrast Bath    Plan  ultrasound at volar wrist and base of thumb/radial wrist; review additions to HEP    Clinical Decision Making  Limited treatment options, no task modification necessary    OT Home Exercise Plan  Education Provided:  Positions to avoid with CTS: CTS HEP (Tendon Glides, Wrist Ext stretch, Median Nerve Glides); Coordination HEP; 08/23/17-additions to HEP    Recommended Other Services  completed Cardiac Rehab 08/05/17    Consulted and Agree with Plan of Care  Patient       Patient will benefit from skilled therapeutic intervention in order to improve the following deficits and impairments:  Impaired sensation, Decreased coordination, Decreased strength, Impaired UE functional use, Decreased knowledge of use of DME  Visit Diagnosis: Other lack of coordination  Muscle weakness (generalized)  Other disturbances of skin sensation    Problem List Patient Active Problem List   Diagnosis Date Noted  . Carpal tunnel syndrome on right 07/08/2017  . Tremor, essential 07/08/2017  . 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 Seizure disorder ([place]) 04/05/2017  . Right arm pain 04/05/2017    [facility] 08/23/2017, 9:26 AM  [facility] [address] [place], [place], [address] Phone: [phone]   Fax:  [phone]  Name: Erik Salazar MRN: [id number] Date of Birth: Nov 19, 1950   Vianne Bulls, OTR/L [facility] [address] [place], [place]  [address] [phone] phone [phone] 08/29/17 8:10 AM

## 2017-08-23 NOTE — Telephone Encounter (Signed)
Recommend discussing with PCP. From cardiac perspective, does not need handicap pass.  Candee Furbish, MD

## 2017-08-27 ENCOUNTER — Ambulatory Visit: Payer: Medicare HMO | Admitting: Occupational Therapy

## 2017-08-27 DIAGNOSIS — R251 Tremor, unspecified: Secondary | ICD-10-CM | POA: Diagnosis not present

## 2017-08-27 DIAGNOSIS — M6281 Muscle weakness (generalized): Secondary | ICD-10-CM

## 2017-08-27 DIAGNOSIS — R208 Other disturbances of skin sensation: Secondary | ICD-10-CM | POA: Diagnosis not present

## 2017-08-27 DIAGNOSIS — R278 Other lack of coordination: Secondary | ICD-10-CM | POA: Diagnosis not present

## 2017-08-27 DIAGNOSIS — R41841 Cognitive communication deficit: Secondary | ICD-10-CM | POA: Diagnosis not present

## 2017-08-27 NOTE — Therapy (Signed)
[facility] [address] [place], [place], [address] Phone: [phone]   Fax:  [phone]  Occupational Therapy Treatment  Patient Details  Name: Erik Salazar MRN: [id number] Date of Birth: 01-21-1951 No Data Recorded  Encounter Date: 08/27/2017  OT End of Session - 08/27/17 1235    Visit Number  5    Number of Visits  13    Date for OT Re-Evaluation  10/05/17    Authorization Type  [employer]na Medicare 100% coverage, no visit limit, no auth req., needs G-code    Authorization - Visit Number  5    Authorization - Number of Visits  10    OT Start Time  0806    OT Stop Time  0850    OT Time Calculation (min)  44 min    Activity Tolerance  Patient tolerated treatment well    Behavior During Therapy  [facility] for tasks assessed/performed       Past Medical History:  Diagnosis Date  . Carpal tunnel syndrome on right 07/08/2017  . Enlarged prostate   . H/O: knee surgery    15   . History of open heart surgery    ASCENDING AORTIC ANEURYSM  . 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 Seizure disorder ([place]) 04/05/2017  . Seizures ([place])   . Status post knee surgery    DVT POST KNEE SURGERY    Past Surgical History:  Procedure Laterality Date  . CARDIAC SURGERY     ANUERSYM MAY 2017   Bentall procedure. 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 CORONARY ARTERY BYPASS GRAFT  03/13/2017   LIMA to LAD with steril abcess removal from dacron graft  . FALSE ANEURYSM REPAIR  03/13/2017   at [facility]   . KNEE SURGERY  1983  . open heart surgery     03-13-2017  . REPLACEMENT TOTAL KNEE  2015  . ROTATOR CUFF REPAIR      There were no vitals filed for this visit.  Subjective Assessment - 08/27/17 1234    Subjective   pt reports his hand has been hurting alot since  last week    Patient Stated Goals  improve coordintation for fine motor/close up things    Currently in Pain?  Yes    Pain Score  6     Pain Location  Wrist    Pain Orientation  Right    Pain Descriptors / Indicators  Aching    Pain Type  Chronic pain    Pain Onset  More than a month ago    Pain Frequency  Constant    Aggravating Factors   use, malpositioning    Pain Relieving Factors  rest         Treatment: [place] 11mhz, 50%, x 8 mins  To right radial wrist. No adverse reactions. Reviewed previously issued HEP, therapist instructed pt to hold off on median n. Glides and wrist flexion / extension as it provokes pain. Pt performed 10-15 reps of remaining HEP. Placing small pegs into pegboard with RUE for increased fine motor coordination, min difficulty. Rotating 2 balls in right hand min-mod difficulty. Fluidotherapy x 10 mins at end of session to RUE for pain relief, no adverse reactions.  OT Short Term Goals - 08/06/17 6962      OT SHORT TERM GOAL #1   Title  Pt will be independent with HEP for coordination.--check STGs 09/04/17    Time  4    Period  Weeks    Status  New      OT SHORT TERM GOAL #2   Title  Pt will verbalize understanding of activity modifications to decr pain in R hand.    Time  4    Period  Weeks    Status  New      OT SHORT TERM GOAL #3   Title  Pt will improve bilateral coordination for ADLs as shown by improving time on 9-hole peg test by at least 4 sec bilaterally.    Baseline  R-38.50sec, L-37.60sec    Time  4    Period  Weeks    Status  New      OT SHORT TERM GOAL #4   Title  Pt will verbalize understanding of compensation strategies for tremors prn.    Baseline  4    Period  Weeks    Status  New        OT Long Term Goals - 08/06/17 9528      OT LONG TERM GOAL #1   Title  Pt will be independent with HEP for conservative management of R CTS.--check LTGs 10/05/16    Time  6    Period  Weeks    Status   New      OT LONG TERM GOAL #2   Title  Pt will improve L grip strength by at least 8lbs for lifting/gripping tasks.    Baseline  R-66lbs, L-48lbs    Time  6    Period  Weeks    Status  New      OT LONG TERM GOAL #3   Title  Pt will improve bilateral coordination for ADLs as shown by improving time on 9-hole peg test by at least 8 sec bilaterally.    Baseline  R-38.50sec, L-37.60sec    Time  6    Period  Weeks    Status  New      OT LONG TERM GOAL #4   Title  Pt will report/demo incr ease of getting cards/money out of wallet using strategies prn.    Time  6    Period  Weeks    Status  New            Plan - 08/27/17 1236    Clinical Impression Statement  Pt progressing towards goals.  He remains limited by pain and sensory impairment.    Rehab Potential  Good    Current Impairments/barriers affecting progress:  R CTS    OT Frequency  2x / week    OT Duration  6 weeks    Plan  fluidotherapy, functional use of RUE    Consulted and Agree with Plan of Care  Patient       Patient will benefit from skilled therapeutic intervention in order to improve the following deficits and impairments:  Impaired sensation, Decreased coordination, Decreased strength, Impaired UE functional use, Decreased knowledge of use of DME  Visit Diagnosis: No diagnosis found.    Problem List Patient Active Problem List   Diagnosis Date Noted  . Carpal tunnel syndrome on right 07/08/2017  . Tremor, essential 07/08/2017  . 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 Seizure disorder ([place]) 04/05/2017  . Right arm pain  04/05/2017    RINE,KATHRYN 08/27/2017, 12:38 PM  [facility] [address] [place], [place], [address] Phone: [phone]   Fax:  [phone]  Name: Erik Salazar MRN: [id number] Date of Birth: [birth year]

## 2017-08-29 ENCOUNTER — Encounter: Payer: Self-pay | Admitting: Occupational Therapy

## 2017-08-29 ENCOUNTER — Ambulatory Visit: Payer: Medicare HMO | Admitting: Occupational Therapy

## 2017-08-29 DIAGNOSIS — R278 Other lack of coordination: Secondary | ICD-10-CM

## 2017-08-29 DIAGNOSIS — M6281 Muscle weakness (generalized): Secondary | ICD-10-CM | POA: Diagnosis not present

## 2017-08-29 DIAGNOSIS — R208 Other disturbances of skin sensation: Secondary | ICD-10-CM

## 2017-08-29 DIAGNOSIS — R251 Tremor, unspecified: Secondary | ICD-10-CM | POA: Diagnosis not present

## 2017-08-29 DIAGNOSIS — R41841 Cognitive communication deficit: Secondary | ICD-10-CM | POA: Diagnosis not present

## 2017-08-29 NOTE — Therapy (Signed)
[facility] [address] [place], [place], [address] Phone: [phone]   Fax:  [phone]  Occupational Therapy Treatment  Patient Details  Name: Erik Salazar MRN: [id number] Date of Birth: 09-Aug-1951 Referring Provider: Dr. Margette Fast   Encounter Date: 08/29/2017  OT End of Session - 08/29/17 0807    Visit Number  6    Number of Visits  13    Date for OT Re-Evaluation  10/05/17    Authorization Type  [employer]na Medicare 100% coverage, no visit limit, no auth req., needs G-code    Authorization - Visit Number  0    Authorization - Number of Visits  0    OT Start Time  0804    OT Stop Time  0845    OT Time Calculation (min)  41 min    Activity Tolerance  Patient tolerated treatment well    Behavior During Therapy  [facility] for tasks assessed/performed       Past Medical History:  Diagnosis Date  . Carpal tunnel syndrome on right 07/08/2017  . Enlarged prostate   . H/O: knee surgery    15   . History of open heart surgery    ASCENDING AORTIC ANEURYSM  . 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 Seizure disorder ([place]) 04/05/2017  . Seizures ([place])   . Status post knee surgery    DVT POST KNEE SURGERY    Past Surgical History:  Procedure Laterality Date  . CARDIAC SURGERY     ANUERSYM MAY 2017   Bentall procedure. 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 CORONARY ARTERY BYPASS GRAFT  03/13/2017   LIMA to LAD with steril abcess removal from dacron graft  . FALSE ANEURYSM REPAIR  03/13/2017   at [facility]   . KNEE SURGERY  1983  . open heart surgery     03-13-2017  . REPLACEMENT TOTAL KNEE  2015  . ROTATOR CUFF REPAIR      There were no vitals filed for this visit.  Subjective Assessment - 08/29/17 0806    Subjective   "not good"--re: pain     Pertinent History  CVA (pt reports bilateral) following CABG 03/13/17, hx of aortic aneurysm repair and CABG x2 (2017), mild CTS, bilateral tremors/?hx of essential tremors, hx of seizure following CVA, disc damage in neck, hx of multiple orthopediac issues/surgeries including hx of 14 knee surgeries, hx of L knee replacement, hx of R bicep tendon repair, hx of R RTC repair, RA, OA,  (hx per Epic and pt report)    Limitations  hx of seizures per Epic    Patient Stated Goals  improve coordintation for fine motor/close up things    Currently in Pain?  Yes    Pain Score  6     Pain Location  Wrist    Pain Orientation  Right    Pain Descriptors / Indicators  Aching;Stabbing    Pain Onset  More than a month ago    Pain Frequency  Constant    Aggravating Factors   use, malpositioning     Pain Relieving Factors  rest                   OT Treatments/Exercises (OP) - 08/29/17 0001  RUE Fluidotherapy   Number Minutes Fluidotherapy  12 Minutes    RUE Fluidotherapy Location  Wrist;Hand    Comments  no adverse reactions due to pain          Manual:  gentle joint mobs to wrist and soft tissue mobs to base of thumb/radial wrist/forearm   Placing grooved pegs in pegboard with min difficulty for in-hand manipulation.  Picking up coins to stack with R hand.  Then manipulating in hand to place in coin back with min difficulty.   Isolated wrist flex/ext AROM, isolated UD AROM stretch, isolated thumb flex with wrist neutral x10 each with min v.c. For positioning.       OT Short Term Goals - 08/06/17 9381      OT SHORT TERM GOAL #1   Title  Pt will be independent with HEP for coordination.--check STGs 09/04/17    Time  4    Period  Weeks    Status  New      OT SHORT TERM GOAL #2   Title  Pt will verbalize understanding of activity modifications to decr pain in R hand.    Time  4    Period  Weeks    Status  New      OT SHORT TERM GOAL #3   Title  Pt will improve bilateral  coordination for ADLs as shown by improving time on 9-hole peg test by at least 4 sec bilaterally.    Baseline  R-38.50sec, L-37.60sec    Time  4    Period  Weeks    Status  New      OT SHORT TERM GOAL #4   Title  Pt will verbalize understanding of compensation strategies for tremors prn.    Baseline  4    Period  Weeks    Status  New        OT Long Term Goals - 08/06/17 8299      OT LONG TERM GOAL #1   Title  Pt will be independent with HEP for conservative management of R CTS.--check LTGs 10/05/16    Time  6    Period  Weeks    Status  New      OT LONG TERM GOAL #2   Title  Pt will improve L grip strength by at least 8lbs for lifting/gripping tasks.    Baseline  R-66lbs, L-48lbs    Time  6    Period  Weeks    Status  New      OT LONG TERM GOAL #3   Title  Pt will improve bilateral coordination for ADLs as shown by improving time on 9-hole peg test by at least 8 sec bilaterally.    Baseline  R-38.50sec, L-37.60sec    Time  6    Period  Weeks    Status  New      OT LONG TERM GOAL #4   Title  Pt will report/demo incr ease of getting cards/money out of wallet using strategies prn.    Time  6    Period  Weeks    Status  New            Plan - 08/29/17 0807    Clinical Impression Statement  Pt demo improving coordination, however, pain continues to limit R hand functional use.  Pt reports wearing splint during the day (thumb spica) and wrist cock-up at night and is avoiding lifting at the gym.    Occupational Profile and client history  currently impacting functional performance  Pt is retired, but was independent prior to CVA.  Pt enjoys going to the gym.  Pt completed cardiac rehab yesterday.  Pt's goals are to be able to use bilateral hands for functional tasks with incr ease/control.     Occupational performance deficits (Please refer to evaluation for details):  ADL's;IADL's;Play    Rehab Potential  Good    Current Impairments/barriers affecting progress:  R CTS     OT Frequency  2x / week    OT Duration  6 weeks +eval    OT Treatment/Interventions  Self-care/ADL training;Moist Heat;Fluidtherapy;DME and/or AE instruction;Splinting;Therapeutic activities;Therapeutic exercise;Cryotherapy;Paraffin;Neuromuscular education;Functional Mobility Training;Passive range of motion;Manual Therapy;Patient/family education;Ultrasound;Contrast Bath    Plan  fluidotherapy, functional use of RUE    Clinical Decision Making  Limited treatment options, no task modification necessary    OT Home Exercise Plan  Education Provided:  Positions to avoid with CTS: CTS HEP (Tendon Glides, Wrist Ext stretch, Median Nerve Glides); Coordination HEP; 08/23/17-additions to HEP    Recommended Other Services  completed Cardiac Rehab 08/05/17    Consulted and Agree with Plan of Care  Patient       Patient will benefit from skilled therapeutic intervention in order to improve the following deficits and impairments:  Impaired sensation, Decreased coordination, Decreased strength, Impaired UE functional use, Decreased knowledge of use of DME  Visit Diagnosis: Other lack of coordination  Muscle weakness (generalized)  Other disturbances of skin sensation    Problem List Patient Active Problem List   Diagnosis Date Noted  . Carpal tunnel syndrome on right 07/08/2017  . Tremor, essential 07/08/2017  . 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 Seizure disorder ([place]) 04/05/2017  . Right arm pain 04/05/2017    [facility] 08/29/2017, 9:59 AM  [facility] [address] [place], [place], [address] Phone: [phone]   Fax:  [phone]  Name: Erik Salazar MRN: [id number] Date of Birth: 1951/02/04   Vianne Bulls, OTR/L [facility] [address] [place], [place]  [address] [phone] phone [phone] 08/29/17 9:59 AM

## 2017-09-03 ENCOUNTER — Telehealth: Payer: Self-pay | Admitting: Neurology

## 2017-09-03 ENCOUNTER — Ambulatory Visit: Payer: Medicare HMO | Admitting: Occupational Therapy

## 2017-09-03 ENCOUNTER — Encounter: Payer: Self-pay | Admitting: Occupational Therapy

## 2017-09-03 DIAGNOSIS — M6281 Muscle weakness (generalized): Secondary | ICD-10-CM

## 2017-09-03 DIAGNOSIS — H538 Other visual disturbances: Secondary | ICD-10-CM

## 2017-09-03 DIAGNOSIS — R208 Other disturbances of skin sensation: Secondary | ICD-10-CM | POA: Diagnosis not present

## 2017-09-03 DIAGNOSIS — R278 Other lack of coordination: Secondary | ICD-10-CM | POA: Diagnosis not present

## 2017-09-03 DIAGNOSIS — R41841 Cognitive communication deficit: Secondary | ICD-10-CM | POA: Diagnosis not present

## 2017-09-03 DIAGNOSIS — R251 Tremor, unspecified: Secondary | ICD-10-CM | POA: Diagnosis not present

## 2017-09-03 DIAGNOSIS — R413 Other amnesia: Secondary | ICD-10-CM

## 2017-09-03 NOTE — Therapy (Signed)
[facility] [address] [place], [place], [address] Phone: [phone]   Fax:  [phone]  Occupational Therapy Treatment  Patient Details  Name: Erik Salazar MRN: [id number] Date of Birth: [birth year] Referring Provider: Dr. Margette Fast   Encounter Date: 09/03/2017  OT End of Session - 09/03/17 1043    Visit Number  7    Number of Visits  13    Date for OT Re-Evaluation  10/05/17    Authorization Type  [employer] Medicare 100% coverage, no visit limit, no auth req.    Authorization - Visit Number  0    Authorization - Number of Visits  0    OT Start Time  1018    OT Stop Time  1100    OT Time Calculation (min)  42 min    Activity Tolerance  Patient tolerated treatment well    Behavior During Therapy  WFL for tasks assessed/performed       Past Medical History:  Diagnosis Date  . Carpal tunnel syndrome on right 07/08/2017  . Enlarged prostate   . H/O: knee surgery    15   . History of open heart surgery    ASCENDING AORTIC ANEURYSM  . 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 Seizure disorder ([place]) 04/05/2017  . Seizures ([place])   . Status post knee surgery    DVT POST KNEE SURGERY    Past Surgical History:  Procedure Laterality Date  . CARDIAC SURGERY     ANUERSYM MAY 2017   Bentall procedure. 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 CORONARY ARTERY BYPASS GRAFT  03/13/2017   LIMA to LAD with steril abcess removal from dacron graft  . FALSE ANEURYSM REPAIR  03/13/2017   at [facility]   . KNEE SURGERY  1983  . open heart surgery     03-13-2017  . REPLACEMENT TOTAL KNEE  2015  . ROTATOR CUFF REPAIR      There were no vitals filed for this visit.  Subjective Assessment - 09/03/17 1023    Subjective   It's about the same    Pertinent History   CVA (pt reports bilateral) following CABG 03/13/17, hx of aortic aneurysm repair and CABG x2 (2017), mild CTS, bilateral tremors/?hx of essential tremors, hx of seizure following CVA, disc damage in neck, hx of multiple orthopediac issues/surgeries including hx of 14 knee surgeries, hx of L knee replacement, hx of R bicep tendon repair, hx of R RTC repair, RA, OA,  (hx per Epic and pt report)    Limitations  hx of seizures per Epic    Patient Stated Goals  improve coordintation for fine motor/close up things    Currently in Pain?  No/denies    Pain Score  6     Pain Orientation  Right    Pain Descriptors / Indicators  Aching;Stabbing    Pain Type  Chronic pain    Pain Onset  More than a month ago    Pain Frequency  Constant    Aggravating Factors   use, malpositioning    Pain Relieving Factors  rest            Fludio x10 min to R hand/wrist with no adverse reactions for pain.  Isolated wrist flex/ext AROM, isolated  UD AROM stretch, isolated thumb flex with wrist neutral x10 each with min v.c. For positioning.    Tendon glides with min v.c. x10  Manual:  Gentle joint mobs to R wrist.  Ice massage to R radial wrist/forearm due to pain with no adverse reactions x72min.  Discussed benefits of speech therapy referral (pt to request from MD) due to memory/cognitive deficits.  Recommended that pt see neuro ophthalmologist  Due to reports of eye fatigue and blurriness if he reads for a period of time.  Pt verbalized desire to see eye doctor (gave pt neuro ophthalmologist contact info)                    OT Education - 09/03/17 1303    Education Details  Basic activity modifications, positions to avoid; use of ice after incr R hand use    Person(s) Educated  Patient    Methods  Explanation    Comprehension  Verbalized understanding       OT Short Term Goals - 08/06/17 2130      OT SHORT TERM GOAL #1   Title  Pt will be independent with HEP for coordination.--check  STGs 09/04/17    Time  4    Period  Weeks    Status  New      OT SHORT TERM GOAL #2   Title  Pt will verbalize understanding of activity modifications to decr pain in R hand.    Time  4    Period  Weeks    Status  New      OT SHORT TERM GOAL #3   Title  Pt will improve bilateral coordination for ADLs as shown by improving time on 9-hole peg test by at least 4 sec bilaterally.    Baseline  R-38.50sec, L-37.60sec    Time  4    Period  Weeks    Status  New      OT SHORT TERM GOAL #4   Title  Pt will verbalize understanding of compensation strategies for tremors prn.    Baseline  4    Period  Weeks    Status  New        OT Long Term Goals - 08/06/17 8657      OT LONG TERM GOAL #1   Title  Pt will be independent with HEP for conservative management of R CTS.--check LTGs 10/05/16    Time  6    Period  Weeks    Status  New      OT LONG TERM GOAL #2   Title  Pt will improve L grip strength by at least 8lbs for lifting/gripping tasks.    Baseline  R-66lbs, L-48lbs    Time  6    Period  Weeks    Status  New      OT LONG TERM GOAL #3   Title  Pt will improve bilateral coordination for ADLs as shown by improving time on 9-hole peg test by at least 8 sec bilaterally.    Baseline  R-38.50sec, L-37.60sec    Time  6    Period  Weeks    Status  New      OT LONG TERM GOAL #4   Title  Pt will report/demo incr ease of getting cards/money out of wallet using strategies prn.    Time  6    Period  Weeks    Status  New  Plan - 09/03/17 1302    Clinical Impression Statement  Pain continues to limit R hand functional use.   Pt reports that splint helps when wearing.       Occupational Profile and client history currently impacting functional performance  Pt is retired, but was independent prior to CVA.  Pt enjoys going to the gym.  Pt completed cardiac rehab yesterday.  Pt's goals are to be able to use bilateral hands for functional tasks with incr ease/control.      Occupational performance deficits (Please refer to evaluation for details):  ADL's;IADL's;Play    Rehab Potential  Good    Current Impairments/barriers affecting progress:  R CTS    OT Frequency  2x / week    OT Duration  6 weeks +eval    OT Treatment/Interventions  Self-care/ADL training;Moist Heat;Fluidtherapy;DME and/or AE instruction;Splinting;Therapeutic activities;Therapeutic exercise;Cryotherapy;Paraffin;Neuromuscular education;Functional Mobility Training;Passive range of motion;Manual Therapy;Patient/family education;Ultrasound;Contrast Bath    Plan  fluidotherapy, check STGs, review activity modifications     Clinical Decision Making  Limited treatment options, no task modification necessary    OT Home Exercise Plan  Education Provided:  Positions to avoid with CTS: CTS HEP (Tendon Glides, Wrist Ext stretch, Median Nerve Glides); Coordination HEP; 08/23/17-additions to HEP    Recommended Other Services  completed Cardiac Rehab 08/05/17    Consulted and Agree with Plan of Care  Patient       Patient will benefit from skilled therapeutic intervention in order to improve the following deficits and impairments:  Impaired sensation, Decreased coordination, Decreased strength, Impaired UE functional use, Decreased knowledge of use of DME  Visit Diagnosis: Other lack of coordination  Muscle weakness (generalized)  Other disturbances of skin sensation    Problem List Patient Active Problem List   Diagnosis Date Noted  . Carpal tunnel syndrome on right 07/08/2017  . Tremor, essential 07/08/2017  . 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 Seizure disorder ([place]) 04/05/2017  . Right arm pain 04/05/2017    [facility] 09/03/2017, 1:05 PM  [facility] [address] [place], [place], [address] Phone: [phone]   Fax:  [phone]  Name: Kara Mierzejewski MRN: [id number] Date of Birth: 1950/09/06   Vianne Bulls, OTR/L [facility] [address] [place], [place]  [address] [phone] phone [phone] 09/03/17 1:15 PM

## 2017-09-03 NOTE — Telephone Encounter (Signed)
I called the patient.  The patient has been seen by occupational therapy, they are recommending speech therapy for cognitive training, I contact the patient, left a message, if he is amenable to this I will get this ordered.  He apparently is having some sort of visual changes as well, I am not completely sure what this is all about.

## 2017-09-03 NOTE — Telephone Encounter (Signed)
I called the patient.  The patient has been seen through occupational therapy, they have recommended cognitive therapy for him.  He is aware of this recommendation, he is amenable to this, I will get speech therapy set up.  He also complains of some new issues with his vision, he reports that he is having blurring of vision after reading 30 minutes or so, this does not go away if he covers one eye or the other, no overt double vision.  He wishes to have a referral for an ophthalmologic evaluation.  I will get this set up as well.

## 2017-09-04 ENCOUNTER — Telehealth: Payer: Self-pay | Admitting: Cardiology

## 2017-09-04 NOTE — Telephone Encounter (Signed)
New message  Pt verbalized that he is calling for RN  He did not disclose the reason for the call

## 2017-09-04 NOTE — Telephone Encounter (Signed)
Attempted to inform pt of Dr Marlou Porch recommendations but is really resistant wants to know why tests have to be done at [facility] and not here locally  Per pt main complaint is for 3 mo office visit he  was there at 9:30 am and did not get done til 4:30 pm.Would like a call from  Cecilie Kicks NP .Adonis Housekeeper

## 2017-09-04 NOTE — Telephone Encounter (Signed)
My advice is to follow-up with his doctor at [facility].  Continuity is key in the situations.  They perform the surgery.  I am not able to clear him/release him to drive.  This will have to be done by his surgeon and will also likely need input from neurology as well.  Candee Furbish, MD

## 2017-09-04 NOTE — Telephone Encounter (Signed)
Pt calling because has an upcoming appt with Dr Ysidro Evert at [facility] in February was not happy with care at [facility] and would like to follow up with [facility] Pt called Dr Lucianne Lei Tright's office but would not schedule an appt because pt was sent to [facility]. Pt doesn't know what to do and still has not been released to drive as of yet ,Will forward to Dr Marlou Porch for review .Adonis Housekeeper

## 2017-09-05 ENCOUNTER — Encounter: Payer: Self-pay | Admitting: Occupational Therapy

## 2017-09-05 ENCOUNTER — Ambulatory Visit: Payer: Medicare HMO | Admitting: Occupational Therapy

## 2017-09-05 DIAGNOSIS — M6281 Muscle weakness (generalized): Secondary | ICD-10-CM

## 2017-09-05 DIAGNOSIS — R278 Other lack of coordination: Secondary | ICD-10-CM | POA: Diagnosis not present

## 2017-09-05 DIAGNOSIS — R208 Other disturbances of skin sensation: Secondary | ICD-10-CM

## 2017-09-05 DIAGNOSIS — R251 Tremor, unspecified: Secondary | ICD-10-CM | POA: Diagnosis not present

## 2017-09-05 DIAGNOSIS — R41841 Cognitive communication deficit: Secondary | ICD-10-CM | POA: Diagnosis not present

## 2017-09-05 NOTE — Telephone Encounter (Signed)
Pt to request from neuro or PCP.

## 2017-09-05 NOTE — Telephone Encounter (Signed)
Follow up     Patient would like to speak with Erik Salazar about his upcoming visit, does not want to say what call is regarding, he said he seen Mickel Baas the last time he was in the office

## 2017-09-05 NOTE — Therapy (Signed)
[facility] [address] [place], [place], [address] Phone: [phone]   Fax:  [phone]  Occupational Therapy Treatment  Patient Details  Name: Erik Salazar MRN: [id number] Date of Birth: November 05, 1950 Referring Provider: Dr. Margette Fast   Encounter Date: 09/05/2017  OT End of Session - 09/05/17 0814    Visit Number  8    Number of Visits  13    Date for OT Re-Evaluation  10/05/17    Authorization Type  [employer]na Medicare 100% coverage, no visit limit, no auth req.    Authorization - Visit Number  0    Authorization - Number of Visits  0    OT Start Time  [phone]    OT Stop Time  0848    OT Time Calculation (min)  41 min    Activity Tolerance  Patient tolerated treatment well    Behavior During Therapy  [facility] for tasks assessed/performed       Past Medical History:  Diagnosis Date  . Carpal tunnel syndrome on right 07/08/2017  . Enlarged prostate   . H/O: knee surgery    15   . History of open heart surgery    ASCENDING AORTIC ANEURYSM  . 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 Seizure disorder ([place]) 04/05/2017  . Seizures ([place])   . Status post knee surgery    DVT POST KNEE SURGERY    Past Surgical History:  Procedure Laterality Date  . CARDIAC SURGERY     ANUERSYM MAY 2017   Bentall procedure. 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 CORONARY ARTERY BYPASS GRAFT  03/13/2017   LIMA to LAD with steril abcess removal from dacron graft  . FALSE ANEURYSM REPAIR  03/13/2017   at [facility]   . KNEE SURGERY  1983  . open heart surgery     03-13-2017  . REPLACEMENT TOTAL KNEE  2015  . ROTATOR CUFF REPAIR      There were no vitals filed for this visit.  Subjective Assessment - 09/05/17 0813    Subjective   It's about the same (pain).  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  Yes    Pain Score  5     Pain Location  Wrist    Pain Orientation  Right    Pain Descriptors / Indicators  Aching;Stabbing    Pain Type  Chronic pain    Pain Onset  More than a month ago    Pain Frequency  Constant    Aggravating Factors   use, malpositioning    Pain Relieving Factors  rest        Fludio x10 min to R hand/wrist  with no adverse reactions for pain.  Isolated wrist flex/ext AROM, isolated UD AROM stretch, Isolated RD stretch with thumb relaxed  x10 each with min v.c. For positioning.   Tendon glides with min v.c. x10  Pt reports that ice seems to make pain worse (tried at home after last session).  Checked STGs and discussed progress.--see below                  OT Education - 09/05/17 0900    Education Details  Reviewed activity modifications to decr pain; recommended home paraffin unit for pain relief; Tremor Compensation Strategies--see pt instructions    Person(s) Educated  Patient    Methods  Explanation;Handout    Comprehension  Verbalized understanding       OT Short Term Goals - 09/05/17 0814      OT SHORT TERM GOAL #1   Title  Pt will be independent with HEP for coordination.--check STGs 09/04/17    Time  4    Period  Weeks    Status  Achieved      OT SHORT TERM GOAL #2   Title  Pt will verbalize understanding of activity modifications to decr pain in R hand.    Time   4    Period  Weeks    Status  Achieved      OT SHORT TERM GOAL #3   Title  Pt will improve bilateral coordination for ADLs as shown by improving time on 9-hole peg test by at least 4 sec bilaterally.    Baseline  R-38.50sec, L-37.60sec    Time  4    Period  Weeks    Status  Achieved 09/05/17:  30.68sec      OT SHORT TERM GOAL #4   Title  Pt will verbalize understanding of compensation strategies for tremors prn.    Baseline  4    Period  Weeks    Status  Achieved        OT Long Term Goals - 09/05/17 0831      OT LONG TERM GOAL #1   Title  Pt will be independent with HEP for conservative management of R CTS.--check LTGs 10/05/16    Time  6    Period  Weeks    Status  New      OT LONG TERM GOAL #2   Title  Pt will improve L grip strength by at least 8lbs for lifting/gripping tasks.    Baseline  R-66lbs, L-48lbs    Time  6    Period  Weeks    Status  New      OT LONG TERM GOAL #3   Title  Pt will improve bilateral coordination for ADLs as shown by improving time on 9-hole peg test by at least 8 sec bilaterally.    Baseline  R-38.50sec, L-37.60sec    Time  6    Period  Weeks    Status  On-going 09/05/17:  R-30.68sec, L-56.34       OT LONG TERM GOAL #4   Title  Pt will report/demo incr ease of getting cards/money out of wallet using strategies prn.    Time  6    Period  Weeks    Status  New            Plan - 09/05/17 3716    Clinical Impression Statement  Pain continues to limit R hand functional use.   Pt reports attempting to use activity modifications.   Pt reports  LUE worse today and that he continues to have tremors, but inconsistent.  (Tremors have been improved during sessions except for today).    Occupational Profile and client history currently impacting functional performance  Pt is retired, but was independent prior to CVA.  Pt enjoys going to the gym.  Pt completed cardiac rehab yesterday.  Pt's goals are to be able to use bilateral hands for functional  tasks with incr ease/control.     Occupational performance deficits (Please refer to evaluation for details):  ADL's;IADL's;Play    Rehab Potential  Good    Current Impairments/barriers affecting progress:  R CTS    OT Frequency  2x / week    OT Duration  6 weeks +eval    OT Treatment/Interventions  Self-care/ADL training;Moist Heat;Fluidtherapy;DME and/or AE instruction;Splinting;Therapeutic activities;Therapeutic exercise;Cryotherapy;Paraffin;Neuromuscular education;Functional Mobility Training;Passive range of motion;Manual Therapy;Patient/family education;Ultrasound;Contrast Bath    Plan  fludiotherapy; discuss d/c vs. continuing OT    Clinical Decision Making  Limited treatment options, no task modification necessary    OT Home Exercise Plan  Education Provided:  Positions to avoid with CTS: CTS HEP (Tendon Glides, Wrist Ext stretch, Median Nerve Glides); Coordination HEP; 08/23/17-additions to HEP    Recommended Other Services  completed Cardiac Rehab 08/05/17    Consulted and Agree with Plan of Care  Patient       Patient will benefit from skilled therapeutic intervention in order to improve the following deficits and impairments:  Impaired sensation, Decreased coordination, Decreased strength, Impaired UE functional use, Decreased knowledge of use of DME  Visit Diagnosis: Other lack of coordination  Muscle weakness (generalized)  Other disturbances of skin sensation    Problem List Patient Active Problem List   Diagnosis Date Noted  . Carpal tunnel syndrome on right 07/08/2017  . Tremor, essential 07/08/2017  . 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 Seizure disorder ([place]) 04/05/2017  . Right arm pain 04/05/2017    [facility] 09/05/2017, 9:11 AM  [facility] [address] [place], [place], [address] Phone: [phone]   Fax:  [phone]  Name: Erik Salazar MRN: [id number] Date of Birth:  Jan 02, 1951   Vianne Bulls, OTR/L [facility] [address] [place], [place]  [address] [phone] phone [phone] 09/05/17 9:11 AM

## 2017-09-05 NOTE — Patient Instructions (Signed)
Compensation Strategies for Tremors  In general: . Avoid stress, fatigue, or rushing as this can increase tremors. . Sit down for activities that require more control/coordination. . Take 2-3 deep breathe to try to relax before doing activities that you know are difficulty. . Try stabilizing elbows on the table or against your body. Marland Kitchen Opening your hand big before using it. . Using larger grip utensils, pens, etc.

## 2017-09-10 ENCOUNTER — Ambulatory Visit: Payer: Medicare HMO | Admitting: Occupational Therapy

## 2017-09-10 DIAGNOSIS — R208 Other disturbances of skin sensation: Secondary | ICD-10-CM | POA: Diagnosis not present

## 2017-09-10 DIAGNOSIS — R251 Tremor, unspecified: Secondary | ICD-10-CM | POA: Diagnosis not present

## 2017-09-10 DIAGNOSIS — M6281 Muscle weakness (generalized): Secondary | ICD-10-CM

## 2017-09-10 DIAGNOSIS — R278 Other lack of coordination: Secondary | ICD-10-CM | POA: Diagnosis not present

## 2017-09-10 DIAGNOSIS — R41841 Cognitive communication deficit: Secondary | ICD-10-CM | POA: Diagnosis not present

## 2017-09-10 NOTE — Patient Instructions (Signed)
1. Grip Strengthening (Resistive Putty)   Squeeze putty using thumb and all fingers. Repeat _20___ times. Do __2__ sessions per day.   2. Roll putty into tube on table and pinch between each finger and thumb x 10 reps each. (can do ring and small finger together)     Copyright  VHI. All rights reserved.   

## 2017-09-10 NOTE — Therapy (Signed)
[facility] [address] [place], [place], [address] Phone: [phone]   Fax:  [phone]  Occupational Therapy Treatment  Patient Details  Name: Erik Salazar MRN: [id number] Date of Birth: Jan 10, 1951 Referring Provider: Dr. Margette Fast   Encounter Date: 09/10/2017  OT End of Session - 09/10/17 1052    Visit Number  9    Number of Visits  13    Date for OT Re-Evaluation  10/05/17    Authorization Type  [employer] 100% coverage, no visit limit, no auth req.    OT Start Time  1018    OT Stop Time  1100    OT Time Calculation (min)  42 min       Past Medical History:  Diagnosis Date  . Carpal tunnel syndrome on right 07/08/2017  . Enlarged prostate   . H/O: knee surgery    15   . History of open heart surgery    ASCENDING AORTIC ANEURYSM  . Ischemic stroke of frontal lobe ([facility]) 04/05/2017   Bilateral  . Seizure disorder ([place]) 04/05/2017  . Seizures ([place])   . Status post knee surgery    DVT POST KNEE SURGERY    Past Surgical History:  Procedure Laterality Date  . CARDIAC SURGERY     ANUERSYM MAY 2017   [place] procedure. 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 CORONARY ARTERY BYPASS GRAFT  03/13/2017   LIMA to LAD with steril abcess removal from dacron graft  . FALSE ANEURYSM REPAIR  03/13/2017   at [facility]   . KNEE SURGERY  1983  . open heart surgery     03-13-2017  . REPLACEMENT TOTAL KNEE  2015  . ROTATOR CUFF REPAIR      There were no vitals filed for this visit.  Subjective Assessment - 09/10/17 1315    Subjective   It's about the same (pain).      Pertinent History  CVA (pt reports bilateral) following CABG 03/13/17, hx of aortic aneurysm repair and CABG x2 (2017), mild CTS, bilateral tremors/?hx of essential tremors, hx of seizure following CVA,  disc damage in neck, hx of multiple orthopediac issues/surgeries including hx of 14 knee surgeries, hx of L knee replacement, hx of R bicep tendon repair, hx of R RTC repair, RA, OA,  (hx per Epic and pt report)    Patient Stated Goals  improve coordintation for fine motor/close up things    Currently in Pain?  Yes    Pain Score  5     Pain Location  Wrist    Pain Orientation  Right    Pain Descriptors / Indicators  Aching    Pain Type  Chronic pain    Pain Onset  More than a month ago    Pain Frequency  Constant    Aggravating Factors   use, malpositioning    Pain Relieving Factors  rest              Treatment: Fluidotherapy x 10 mins to LUE for pain, no adverse reactions.             OT Education - 09/10/17 1313    Education provided  Yes    Education Details  Reveiwed previous wrist ulnar / radial dev, thumb flexion and tendon glidding added yellow  putty HEP, reviewed fine motor coordination activities(cards, ball, pennies)    Person(s) Educated  Patient    Methods  Explanation;Demonstration;Verbal cues;Handout    Comprehension  Verbalized understanding;Returned demonstration       OT Short Term Goals - 09/05/17 2952      OT SHORT TERM GOAL #1   Title  Pt will be independent with HEP for coordination.--check STGs 09/04/17    Time  4    Period  Weeks    Status  Achieved      OT SHORT TERM GOAL #2   Title  Pt will verbalize understanding of activity modifications to decr pain in R hand.    Time  4    Period  Weeks    Status  Achieved      OT SHORT TERM GOAL #3   Title  Pt will improve bilateral coordination for ADLs as shown by improving time on 9-hole peg test by at least 4 sec bilaterally.    Baseline  R-38.50sec, L-37.60sec    Time  4    Period  Weeks    Status  Achieved 09/05/17:  30.68sec      OT SHORT TERM GOAL #4   Title  Pt will verbalize understanding of compensation strategies for tremors prn.    Baseline  4    Period  Weeks    Status   Achieved        OT Long Term Goals - 09/05/17 0831      OT LONG TERM GOAL #1   Title  Pt will be independent with HEP for conservative management of R CTS.--check LTGs 10/05/16    Time  6    Period  Weeks    Status  New      OT LONG TERM GOAL #2   Title  Pt will improve L grip strength by at least 8lbs for lifting/gripping tasks.    Baseline  R-66lbs, L-48lbs    Time  6    Period  Weeks    Status  New      OT LONG TERM GOAL #3   Title  Pt will improve bilateral coordination for ADLs as shown by improving time on 9-hole peg test by at least 8 sec bilaterally.    Baseline  R-38.50sec, L-37.60sec    Time  6    Period  Weeks    Status  On-going 09/05/17:  R-30.68sec, L-56.34       OT LONG TERM GOAL #4   Title  Pt will report/demo incr ease of getting cards/money out of wallet using strategies prn.    Time  6    Period  Weeks    Status  New            Plan - 09/10/17 1052    Clinical Impression Statement  Pt is progressing slowly towards goals. Pt agrees with plans to d/c next visit.    Rehab Potential  Good    OT Frequency  2x / week    OT Duration  6 weeks    OT Treatment/Interventions  Self-care/ADL training;Moist Heat;Fluidtherapy;DME and/or AE instruction;Splinting;Therapeutic activities;Therapeutic exercise;Cryotherapy;Paraffin;Neuromuscular education;Functional Mobility Training;Passive range of motion;Manual Therapy;Patient/family education;Ultrasound;Contrast Bath    Plan  d/c OT, check on putty HEP    Consulted and Agree with Plan of Care  Patient       Patient will benefit from skilled therapeutic intervention in order to improve the following deficits and impairments:  Impaired sensation, Decreased coordination, Decreased strength, Impaired UE functional  use, Decreased knowledge of use of DME  Visit Diagnosis: Other lack of coordination  Muscle weakness (generalized)  Other disturbances of skin sensation    Problem List Patient Active Problem List    Diagnosis Date Noted  . Carpal tunnel syndrome on right 07/08/2017  . Tremor, essential 07/08/2017  . 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 Seizure disorder ([place]) 04/05/2017  . Right arm pain 04/05/2017     [place] 09/10/2017, 1:16 PM  [facility] [address] [place], [place], [address] Phone: [phone]   Fax:  [phone]  Name: Huckleberry Martinson MRN: [id number] Date of Birth: 1951/04/24

## 2017-09-10 NOTE — Addendum Note (Signed)
Encounter addended by: Lowell Guitar, RN on: 09/10/2017 2:43 PM  Actions taken: Episode resolved

## 2017-09-10 NOTE — Addendum Note (Signed)
Encounter addended by: Lowell Guitar, RN on: 09/10/2017 2:41 PM  Actions taken: Flowsheet data copied forward, Visit Navigator Flowsheet section accepted, Sign clinical note

## 2017-09-10 NOTE — Progress Notes (Signed)
Late entry for 07/19/2017

## 2017-09-10 NOTE — Progress Notes (Signed)
Discharge Progress Report  Patient Details  Name: Erik Salazar MRN: [id number] Date of Birth: 1951-03-16 Referring Provider:     [name] from 05/07/2017 in [place]  Referring Provider  Candee Furbish MD       Number of Visits: 36   Reason for Discharge:  Patient has met program and personal goals.  Smoking History:  Social History   Tobacco Use  Smoking Status Never Smoker  Smokeless Tobacco Never Used    Diagnosis:  S/P CABG x 1  ADL UCSD:   Initial Exercise Prescription: Initial Exercise Prescription - 05/07/17 0900      Date of Initial Exercise RX and Referring Provider   Date  05/07/17    Referring Provider  Candee Furbish MD      Bike   Level  2 upright scifit    Watts  30    Minutes  10    METs  3.18      NuStep   Level  3    SPM  80    Minutes  10    METs  2.5      Track   Laps  11    Minutes  10    METs  2.92      Prescription Details   Frequency (times per week)  3    Duration  Progress to 30 minutes of continuous aerobic without signs/symptoms of physical distress      Intensity   THRR 40-80% of Max Heartrate  62-123    Ratings of Perceived Exertion  11-13    Perceived Dyspnea  0-4      Progression   Progression  Continue to progress workloads to maintain intensity without signs/symptoms of physical distress.      Resistance Training   Training Prescription  Yes    Weight  2lbs    Reps  10-15       Discharge Exercise Prescription (Final Exercise Prescription Changes): Exercise Prescription Changes - 08/05/17 1200      Response to Exercise   Blood Pressure (Admit)  110/70    Blood Pressure (Exercise)  120/80    Blood Pressure (Exit)  120/80    Heart Rate (Admit)  99 bpm    Heart Rate (Exercise)  150 bpm    Heart Rate (Exit)  105 bpm    Rating of Perceived Exertion (Exercise)  12    Symptoms  none reduced WL on activity due to elevated HR    Comments  pt does resisted  cable column as an exercise station for 10 minutes    Duration  Continue with 30 min of aerobic exercise without signs/symptoms of physical distress.    Intensity  THRR unchanged      Progression   Progression  Continue to progress workloads to maintain intensity without signs/symptoms of physical distress.    Average METs  4.5      Resistance Training   Training Prescription  Yes    Weight  8lbs    Reps  10-15    Time  10 Minutes      Bike   Level  5 upright scifit    Minutes  10    METs  5.9      NuStep   Level  5    SPM  100    Minutes  10    METs  3      Home Exercise Plan   Plans to continue  exercise at  Home (comment) walking    Frequency  Add 2 additional days to program exercise sessions.    Initial Home Exercises Provided  05/24/17       Functional Capacity: 6 Minute Walk    Row Name 05/07/17 0953 07/19/17 1049       6 Minute Walk   Phase  Initial  Discharge    Distance  1560 feet  1819 feet    Distance % Change  -  16.6 %    Distance Feet Change  -  259 ft    Walk Time  6 minutes  6 minutes    # of Rest Breaks  0  0    MPH  2.95  3.4    METS  3.82  4.4    RPE  11  11    VO2 Peak  13.4  15.4    Symptoms  Yes (comment)  No    Comments  2/10 knee pain  -    Resting HR  97 bpm  87 bpm    Resting BP  117/64  114/82    Resting Oxygen Saturation   97 %  -    Exercise Oxygen Saturation  during 6 min walk  100 %  -    Max Ex. HR  126 bpm  147 bpm    Max Ex. BP  124/67  114/60    2 Minute Post BP  125/94  104/60       Psychological, QOL, Others - Outcomes: PHQ 2/9: Depression screen [facility] 2/9 08/05/2017 05/13/2017  Decreased Interest 0 0  Down, Depressed, Hopeless 0 0  PHQ - 2 Score 0 0    Quality of Life: Quality of Life - 07/22/17 1546      Quality of Life Scores   Health/Function Pre  20 %    Health/Function Post  26.4 %    Health/Function % Change  32 %    Socioeconomic Pre  27.7 %    Socioeconomic Post  27 %    Socioeconomic % Change    -2.53 %    Psych/Spiritual Pre  24.86 %    Psych/Spiritual Post  24.86 %    Psych/Spiritual % Change  0 %    Family Pre  25.2 %    Family Post  27.6 %    Family % Change  9.52 %    GLOBAL Pre  23.49 %    GLOBAL Post  26.36 %    GLOBAL % Change  12.22 %       Personal Goals: Goals established at orientation with interventions provided to work toward goal. Personal Goals and Risk Factors at Admission - 05/07/17 1031      Core Components/Risk Factors/Patient Goals on Admission   Admit Weight  175 lb 4.3 oz (79.5 kg)        Personal Goals Discharge: Goals and Risk Factor Review    Row Name 05/22/17 1048 05/24/17 1532 06/19/17 0913 06/21/17 1139 07/19/17 1636     Core Components/Risk Factors/Patient Goals Review   Personal Goals Review  Hypertension;Improve shortness of breath with ADL's;Stress  Hypertension;Improve shortness of breath with ADL's;Stress  Hypertension;Improve shortness of breath with ADL's;Stress  Hypertension;Improve shortness of breath with ADL's;Stress  Hypertension;Improve shortness of breath with ADL's;Stress   Review  pt with desire to maintain current weight and improve functional symptoms. pt demonstrates eagerness to particpate in [place] activities.  BP well managed at CR.   pt with desire to  maintain current weight and improve functional symptoms. pt demonstrates eagerness to particpate in [place] activities.  BP well managed at CR.   pt with desire to maintain current weight and improve functional symptoms. pt demonstrates eagerness to particpate in [place] activities.  BP well managed at CR.   pt with desire to maintain current weight and improve functional symptoms. pt reports decreased hand discomfort.  pt demonstrates eagerness to particpate in [place] activities.  BP well managed at CR.   pt with desire to maintain current weight and improve functional symptoms. pt reports increased strength/stamina.  pt demonstrates eagerness to particpate in [place] activities.  BP well managed at  CR.    Expected Outcomes  pt will participate in CR exercise, nutrition and lifestyle modification activities to decrease overall RF.   pt will participate in CR exercise, nutrition and lifestyle modification activities to decrease overall RF.   pt will participate in CR exercise, nutrition and lifestyle modification activities to decrease overall RF.   pt will participate in CR exercise, nutrition and lifestyle modification activities to decrease overall RF.   pt will participate in CR exercise, nutrition and lifestyle modification activities to decrease overall RF.    [place] Name 08/05/17 1046             Core Components/Risk Factors/Patient Goals Review   Personal Goals Review  Hypertension;Improve shortness of breath with ADL's;Stress       Review  pt with significantly improved functional capacity. pt tolerates exercise without difficulty. pt hand strength improved with decreased tremors. pt has discontinued gabapentin.pt plans to start occupational  therapy next week to further improve hand strength.        Expected Outcomes  pt will participate in exercise, nutrition and lifestyle modification activities to decrease overall RF.           Exercise Goals and Review: Exercise Goals    Row Name 05/07/17 1011             Exercise Goals   Increase Physical Activity  Yes       Intervention  Provide advice, education, support and counseling about physical activity/exercise needs.;Develop an individualized exercise prescription for aerobic and resistive training based on initial evaluation findings, risk stratification, comorbidities and participant's personal goals.       Expected Outcomes  Achievement of increased cardiorespiratory fitness and enhanced flexibility, muscular endurance and strength shown through measurements of functional capacity and personal statement of participant.       Increase Strength and Stamina  Yes       Intervention  Provide advice, education, support and  counseling about physical activity/exercise needs.;Develop an individualized exercise prescription for aerobic and resistive training based on initial evaluation findings, risk stratification, comorbidities and participant's personal goals.       Expected Outcomes  Achievement of increased cardiorespiratory fitness and enhanced flexibility, muscular endurance and strength shown through measurements of functional capacity and personal statement of participant.       Able to understand and use rate of perceived exertion (RPE) scale  Yes       Intervention  Provide education and explanation on how to use RPE scale       Expected Outcomes  Short Term: Able to use RPE daily in rehab to express subjective intensity level;Long Term:  Able to use RPE to guide intensity level when exercising independently       Knowledge and understanding of Target Heart Rate Range (THRR)  Yes  Intervention  Provide education and explanation of THRR including how the numbers were predicted and where they are located for reference       Expected Outcomes  Short Term: Able to state/look up THRR;Long Term: Able to use THRR to govern intensity when exercising independently;Short Term: Able to use daily as guideline for intensity in rehab       Able to check pulse independently  Yes       Intervention  Provide education and demonstration on how to check pulse in carotid and radial arteries.;Review the importance of being able to check your own pulse for safety during independent exercise       Expected Outcomes  Short Term: Able to explain why pulse checking is important during independent exercise;Long Term: Able to check pulse independently and accurately       Understanding of Exercise Prescription  Yes       Intervention  Provide education, explanation, and written materials on patient's individual exercise prescription       Expected Outcomes  Short Term: Able to explain program exercise prescription;Long Term: Able to  explain home exercise prescription to exercise independently          Nutrition & Weight - Outcomes: Pre Biometrics - 09/10/17 1436      Pre Biometrics   Height  --    Weight  --    Waist Circumference  --    Hip Circumference  --    Waist to Hip Ratio  --    BMI (Calculated)  --    Triceps Skinfold  --    % Body Fat  --    Grip Strength  --    Flexibility  --    Single Leg Stand  --      Post Biometrics - 07/19/17 1056       Post  Biometrics   % Body Fat  24.6 %       Nutrition: Nutrition Therapy & Goals - 05/15/17 1025      Nutrition Therapy   Diet  Therapeutic Lifestyle Changes      Personal Nutrition Goals   Nutrition Goal  Pt to identify food quantities necessary to achieve weight loss of 6-15 lb at graduation from cardiac rehab.       Intervention Plan   Intervention  Prescribe, educate and counsel regarding individualized specific dietary modifications aiming towards targeted core components such as weight, hypertension, lipid management, diabetes, heart failure and other comorbidities.    Expected Outcomes  Short Term Goal: Understand basic principles of dietary content, such as calories, fat, sodium, cholesterol and nutrients.;Long Term Goal: Adherence to prescribed nutrition plan.       Nutrition Discharge: Nutrition Assessments - 08/02/17 1351      MEDFICTS Scores   Pre Score  30    Post Score  3    Score Difference  -27       Education Questionnaire Score: Knowledge Questionnaire Score - 07/22/17 1546      Knowledge Questionnaire Score   Pre Score  23/24    Post Score  24/24       Goals reviewed with patient; copy given to patient.

## 2017-09-12 ENCOUNTER — Encounter: Payer: Self-pay | Admitting: Occupational Therapy

## 2017-09-12 ENCOUNTER — Ambulatory Visit: Payer: Medicare HMO | Admitting: Occupational Therapy

## 2017-09-12 ENCOUNTER — Other Ambulatory Visit: Payer: Self-pay

## 2017-09-12 ENCOUNTER — Ambulatory Visit: Payer: Medicare HMO | Admitting: Speech Pathology

## 2017-09-12 ENCOUNTER — Encounter: Payer: Self-pay | Admitting: Speech Pathology

## 2017-09-12 DIAGNOSIS — M6281 Muscle weakness (generalized): Secondary | ICD-10-CM

## 2017-09-12 DIAGNOSIS — R278 Other lack of coordination: Secondary | ICD-10-CM

## 2017-09-12 DIAGNOSIS — R208 Other disturbances of skin sensation: Secondary | ICD-10-CM | POA: Diagnosis not present

## 2017-09-12 DIAGNOSIS — R251 Tremor, unspecified: Secondary | ICD-10-CM | POA: Diagnosis not present

## 2017-09-12 DIAGNOSIS — R41841 Cognitive communication deficit: Secondary | ICD-10-CM

## 2017-09-12 NOTE — Patient Instructions (Signed)
   Memory Strategies  W - Write it down  A - Associate it with something  R - Repeat it  M - Mental Image     Play the memory game  Try to remember 3-5 items on your store list without looking  Study a detailed picture in a magazine for 1 minute, then write down everything you can remember from the picture  Use mental imaging or story to recall list of words

## 2017-09-12 NOTE — Telephone Encounter (Signed)
I called but no answer, I left a message.

## 2017-09-12 NOTE — Therapy (Signed)
[facility] [address] [place], [place], [address] Phone: [phone]   Fax:  [phone]  Speech Language Pathology Evaluation  Patient Details  Name: Erik Salazar MRN: [id number] Date of Birth: September 09, 1950 Referring Provider: Dr. Margette Fast   Encounter Date: 09/12/2017  End of Session - 09/12/17 1219    Visit Number  1    Number of Visits  17 8 weeks or total of 17 visits    Date for SLP Re-Evaluation  11/08/17       Past Medical History:  Diagnosis Date  . Carpal tunnel syndrome on right 07/08/2017  . Enlarged prostate   . H/O: knee surgery    15   . History of open heart surgery    ASCENDING AORTIC ANEURYSM  . 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 Seizure disorder ([place]) 04/05/2017  . Seizures ([place])   . Status post knee surgery    DVT POST KNEE SURGERY    Past Surgical History:  Procedure Laterality Date  . CARDIAC SURGERY     ANUERSYM MAY 2017   Bentall procedure. 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 CORONARY ARTERY BYPASS GRAFT  03/13/2017   LIMA to LAD with steril abcess removal from dacron graft  . FALSE ANEURYSM REPAIR  03/13/2017   at [facility]   . KNEE SURGERY  1983  . open heart surgery     03-13-2017  . REPLACEMENT TOTAL KNEE  2015  . ROTATOR CUFF REPAIR      There were no vitals filed for this visit.  Subjective Assessment - 09/12/17 1213    Subjective  "I cant remember information for any amount of time - I used to have a photographic type memory"         SLP Evaluation [facility] - 09/12/17 0846      SLP Visit Information   SLP Received On  09/12/17    Referring Provider  Dr. 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  Yes    Pain Score  5     Pain Location  Wrist    Pain Orientation  Right    Pain Type  Chronic pain    Pain Onset  More than a month ago    Pain Frequency  Constant    Pain Relieving Factors  rest      General Information   HPI  CVA (pt reports bilateral) following CABG 03/13/17, hx of aortic aneurysm repair and CABG x2 (2017), mild CTS, bilateral tremors/?hx of essential tremors, hx of seizure following CVA, disc damage in neck, hx of multiple orthopediac issues/surgeries including hx of 14 knee surgeries, hx of L knee replacement, hx of R bicep tendon repair, hx of R RTC repair, RA, OA,  (    Mobility Status  walks independently      Balance Screen   Has the patient fallen in the past 6 months  No    Has the patient had a decrease in activity level because of  a fear of falling?   No    Is the patient reluctant to leave their home because of a fear of falling?   No      Prior Functional Status   Cognitive/Linguistic Baseline  Within functional limits    Type of Home  House     Lives With  Spouse    Available Support  Family      Cognition   Overall Cognitive Status  Impaired/Different from baseline    Attention  Alternating    Memory  Impaired    Memory Impairment  Decreased short term memory    Decreased Short Term Memory  Verbal basic;Functional basic    Awareness  Appears intact    Problem Solving  Impaired    Problem Solving Impairment  Functional complex    Executive Function  Self Correcting;Self Monitoring      Auditory Comprehension   Overall Auditory Comprehension  Appears within functional limits for tasks assessed      Visual Recognition/Discrimination   Discrimination  Within Function Limits      Reading Comprehension   Reading Status  Within funtional limits      Expression   Primary Mode of Expression  Verbal      Written Expression   Dominant Hand  Right     Written Expression  Within Functional Limits      Motor Speech   Overall Motor Speech  Appears within functional limits for tasks assessed      Standardized Assessments   Standardized Assessments   Cognitive Linguistic Quick Test      Cognitive Linguistic Quick Test (Ages 18-69)   Attention  Mild    Memory  WNL lower limit normal - 156    Executive Function  WNL    Language  WNL    Visuospatial Skills  Mild    Severity Rating Total  18    Composite Severity Rating  15.6                      SLP Education - 09/12/17 1218    Education provided  Yes    Education Details  memory strategies, memory enhancing activities to do at home, goals for ST. high level attention impairment    Person(s) Educated  Patient    Methods  Explanation;Demonstration;Verbal cues;Handout    Comprehension  Verbalized understanding;Returned demonstration       SLP Short Term Goals - 09/12/17 1229      SLP SHORT TERM GOAL #1   Title  Pt will carryover 2 memory strategies at home with rare min A over 3 sessions, per pt report    Time  4    Period  Weeks    Status  New      SLP SHORT TERM GOAL #2   Title  Pt will divide attention between 2 milldly complex cognitive linguistic tasks with 90% on each and rare min A over 2 sessions    Time  4    Period  Weeks      SLP SHORT TERM GOAL #3   Title  Pt will report completing daily memory activities at home with rare min A over 3 sessions    Time  4    Period  Weeks    Status  New       SLP Long Term Goals - 09/12/17 1231      SLP LONG TERM GOAL #1   Title  Pt will carryover 3 memory  strategies at home/work over 3 sessions with rare min A per pt report    Time  8    Period  Weeks    Status  New      SLP LONG TERM GOAL #2   Title  Pt will divide attention between 2 complex cognitive lingusitic activites with 85% on each and occasional min A    Time  8    Period  Weeks    Status  New      SLP LONG TERM GOAL #3   Title  Pt will  verbalize 3 compensations for attention for possbile return to work with occasional min A    Time  8    Period  Weeks    Status  New       Plan - 09/12/17 1220    Clinical Impression Statement  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 He is referred by neurologist due to persistent cognitive deficits. Mr. Mah has not received ST prior to this visit. Mr. Thull's primary complaint is reduced short term memory, and some long term memory (books of the Bible). He recalls his meds and schedule, however, has difficulty retaining grocery lists, recalling readings etc. 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 Today, he presents with mild high level cognitive impairment, including divided attention, complex attention to details, short term memory. Pt recalled 3/5 words with a delay. The Cognitive Linguistic Quick Test revealed mild attention impairment and mild visuospatial skills. Mr. Robb Matar reports his memory stategy is repeitition. On clock drawing, design generation, and trail making, Mr. Hennick ID'd his errors , but did not correct them with cues. Overall, processing slightly slow. I recommend skilled ST to maximize cognition and carryover of memory strategies for improved independence and possible return to work.     Speech Therapy Frequency  2x / week    Duration  -- 8 weeks or 17 visits    Treatment/Interventions  SLP instruction and feedback;Cognitive reorganization;Functional tasks;Compensatory strategies;Patient/family education;Internal/external aids;Environmental controls    Potential to Achieve Goals  Good    Potential Considerations  -- pt prior Curator, no offical h/o concussion, however he does report some hits to his head    Consulted and Agree with Plan of Care  Patient       Patient will benefit from skilled therapeutic intervention in order to improve the following deficits  and impairments:   Cognitive communication deficit - Plan: SLP plan of care cert/re-cert    Problem List Patient Active Problem List   Diagnosis Date Noted  . Carpal tunnel syndrome on right 07/08/2017  . Tremor, essential 07/08/2017  . 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 Seizure disorder ([place]) 04/05/2017  . Right arm pain 04/05/2017    Nori Poland, Annye Rusk MS, CCC-SLP 09/12/2017, 2:33 PM  [facility] [address] [place], [place], [address] Phone: [phone]   Fax:  [phone]  Name: Khyren Hing MRN: [id number] Date of Birth: [birth year]

## 2017-09-12 NOTE — Therapy (Signed)
[facility] [address] [place], [place], [address] Phone: [phone]   Fax:  [phone]  Occupational Therapy Treatment  Patient Details  Name: Erik Salazar MRN: [id number] Date of Birth: 1951/01/01 Referring Provider: Dr. Margette Fast   Encounter Date: 09/12/2017  OT End of Session - 09/12/17 0814    Visit Number  10    Number of Visits  13    Date for OT Re-Evaluation  10/05/17    Authorization Type  [employer] 100% coverage, no visit limit, no auth req.    OT Start Time  0808    OT Stop Time  0847    OT Time Calculation (min)  39 min    Activity Tolerance  Patient tolerated treatment well    Behavior During Therapy  [facility] for tasks assessed/performed       Past Medical History:  Diagnosis Date  . Carpal tunnel syndrome on right 07/08/2017  . Enlarged prostate   . H/O: knee surgery    15   . History of open heart surgery    ASCENDING AORTIC ANEURYSM  . 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 Seizure disorder ([place]) 04/05/2017  . Seizures ([place])   . Status post knee surgery    DVT POST KNEE SURGERY    Past Surgical History:  Procedure Laterality Date  . CARDIAC SURGERY     ANUERSYM MAY 2017   Bentall procedure. 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 CORONARY ARTERY BYPASS GRAFT  03/13/2017   LIMA to LAD with steril abcess removal from dacron graft  . FALSE ANEURYSM REPAIR  03/13/2017   at [facility]   . KNEE SURGERY  1983  . open heart surgery     03-13-2017  . REPLACEMENT TOTAL KNEE  2015  . ROTATOR CUFF REPAIR      There were no vitals filed for this visit.  Subjective Assessment - 09/12/17 0813    Subjective   It's about the same (pain).      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  Yes    Pain Score  5     Pain Location  Wrist    Pain Orientation  Right    Pain Descriptors / Indicators  Aching    Pain Type  Chronic pain    Pain Onset  More than a month ago    Pain Frequency  Constant    Aggravating Factors   use, malpositioning    Pain Relieving Factors  rest        Fludio x12 min to R hand/wrist with no adverse reactions for pain.  Manual:  gentle joint mobs to wrist and soft tissue mobs to base of thumb/radial wrist/forearm  Isolated wrist flex/ext AROM, isolated UD AROM stretch, Isolated RD stretch  with thumb relaxed  X10, isolated thumb flex each with min v.c. For positioning.   Tendon glides with min v.c. For neutral wrist x10   Checked remaining goals and discussed progress.--see below.  Discussed considerations/recommendations to continue at home (activity modifications, observing wrist positioning with functional tasks, reviewed tremor compensations, use of home Paraffin unit for pain, continued use of splint for heavier tasks, consider Ortho MD assessment if pain worsens and remains limiting factor).  Pt verbalized understanding of continued recommendations.  Pt pleased with progress and agrees to d/c at this time.                      OT Short Term Goals - 09/12/17 4562      OT SHORT TERM GOAL #1   Title  Pt will be independent with HEP for coordination.--check STGs 09/04/17    Time  4    Period  Weeks    Status  Achieved      OT SHORT TERM GOAL #2   Title  Pt will verbalize understanding of activity  modifications to decr pain in R hand.    Time  4    Period  Weeks    Status  Achieved      OT SHORT TERM GOAL #3   Title  Pt will improve bilateral coordination for ADLs as shown by improving time on 9-hole peg test by at least 4 sec bilaterally.    Baseline  R-38.50sec, L-37.60sec    Time  4    Period  Weeks    Status  Achieved 09/05/17:  30.68sec      OT SHORT TERM GOAL #4   Title  Pt will verbalize understanding of compensation strategies for tremors prn.    Baseline  4    Period  Weeks    Status  Achieved        OT Long Term Goals - 09/12/17 5638      OT LONG TERM GOAL #1   Title  Pt will be independent with HEP for conservative management of R CTS.--check LTGs 10/05/16    Time  6    Period  Weeks    Status  Achieved      OT LONG TERM GOAL #2   Title  Pt will improve L grip strength by at least 8lbs for lifting/gripping tasks.    Baseline  R-66lbs, L-48lbs    Time  6    Period  Weeks    Status  Achieved L-64lbs      OT LONG TERM GOAL #3   Title  Pt will improve bilateral coordination for ADLs as shown by improving time on 9-hole peg test by at least 8 sec bilaterally.    Baseline  R-38.50sec, L-37.60sec    Time  6    Period  Weeks    Status  Partially Met 09/05/17:  R-30.68sec, L-56.34.  09/12/17:  Met with R 29.16sec, Not met with L-32.69sec      OT LONG TERM GOAL #4   Title  Pt will report/demo incr ease of getting cards/money out of wallet using strategies prn.    Time  6    Period  Weeks    Status  Achieved            Plan - 09/12/17 9373    Clinical Impression Statement  Pt has made some progress, but RUE functional use continues to be limited due to chronic pain.      Rehab Potential  Good    OT Frequency  2x / week    OT Duration  6 weeks    OT Treatment/Interventions  Self-care/ADL training;Moist Heat;Fluidtherapy;DME and/or AE instruction;Splinting;Therapeutic activities;Therapeutic exercise;Cryotherapy;Paraffin;Neuromuscular education;Functional  Mobility Training;Passive range of motion;Manual Therapy;Patient/family education;Ultrasound;Contrast Bath    Plan  d/c OT    OT Home Exercise Plan  Education Provided:  Positions to avoid with CTS: CTS HEP (Tendon Glides, Wrist Ext stretch, Median Nerve Glides); Coordination HEP; 08/23/17-additions to HEP    Consulted and Agree with Plan of Care  Patient       Patient will benefit from skilled therapeutic intervention in order to improve the following deficits and impairments:  Impaired sensation, Decreased coordination, Decreased strength, Impaired UE functional use, Decreased knowledge of use of DME  Visit Diagnosis: Other lack of coordination  Muscle weakness (generalized)  Other disturbances of skin sensation    Problem List Patient Active Problem List   Diagnosis Date Noted  . Carpal tunnel syndrome on right 07/08/2017  . Tremor, essential 07/08/2017  . 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 Seizure disorder ([place]) 04/05/2017  . Right arm pain 04/05/2017   OCCUPATIONAL THERAPY DISCHARGE SUMMARY  Visits from Start of Care: 10  Current functional level related to goals / functional outcomes: See above   Remaining deficits: Pt continues to experience chronic pain with minor relief with heat or wearing forearm-based thumb spica splint. Decr sensation, tremors, decr coordination, decr strength.   Education / Equipment: Pt instructed in HEP, activity modifications to decr pain and  Tremors, use of home paraffin.  Pt verbalized understanding of education provided.  Plan: Patient agrees to discharge.  Patient goals were partially met. Patient is being discharged due to                                                     Reaching maximal rehab potential at this time.?????        [facility] 09/12/2017, 9:53 AM  [facility] [address] [place], [place], [address] Phone: [phone]   Fax:   [phone]  Name: Erik Salazar MRN: [id number] Date of Birth: 10-09-1950   Vianne Bulls, OTR/L [facility] [address] [place], [place]  [address] [phone] phone [phone] 09/12/17 9:53 AM

## 2017-09-13 ENCOUNTER — Encounter: Payer: Medicare HMO | Admitting: Occupational Therapy

## 2017-09-13 ENCOUNTER — Telehealth: Payer: Self-pay | Admitting: Cardiology

## 2017-09-13 DIAGNOSIS — Z952 Presence of prosthetic heart valve: Secondary | ICD-10-CM

## 2017-09-13 NOTE — Telephone Encounter (Signed)
New message     Patient request call from Cecilie Kicks. Will not give any details for reason of call. Please call again

## 2017-09-16 NOTE — Telephone Encounter (Signed)
New message ° °Pt verbalized that she is calling for the RN °

## 2017-09-16 NOTE — Telephone Encounter (Signed)
Routed call to Cecilie Kicks, NP

## 2017-09-17 ENCOUNTER — Encounter: Payer: Self-pay | Admitting: Speech Pathology

## 2017-09-17 ENCOUNTER — Ambulatory Visit: Payer: Medicare HMO | Admitting: Speech Pathology

## 2017-09-17 DIAGNOSIS — M6281 Muscle weakness (generalized): Secondary | ICD-10-CM | POA: Diagnosis not present

## 2017-09-17 DIAGNOSIS — R41841 Cognitive communication deficit: Secondary | ICD-10-CM | POA: Diagnosis not present

## 2017-09-17 DIAGNOSIS — R278 Other lack of coordination: Secondary | ICD-10-CM | POA: Diagnosis not present

## 2017-09-17 DIAGNOSIS — R208 Other disturbances of skin sensation: Secondary | ICD-10-CM | POA: Diagnosis not present

## 2017-09-17 DIAGNOSIS — R251 Tremor, unspecified: Secondary | ICD-10-CM | POA: Diagnosis not present

## 2017-09-17 NOTE — Telephone Encounter (Signed)
I talked to [name] and he is not happy with care at [facility] and would like to be followed here in [place].  He lives here. I discussed with Dr. Marlou Porch and we will do echo but I will send message to Dr. Prescott Gum concerning post op.  Perhaps if Dr. Prescott Gum wants him seen at [facility] for follow up pt will be more agreeable with this.  I discussed with pt.  Erik Salazar

## 2017-09-17 NOTE — Patient Instructions (Signed)
  Continue memory and brain activities daily  Bring homework back in your folder  Make note of any difficulties or frustrations you have due to your memory/cognition for next session  You are doing a great job doing brain work at home!! Keep it up!

## 2017-09-17 NOTE — Therapy (Signed)
[facility] [address] [place], [place], [address] Phone: [phone]   Fax:  [phone]  Speech Language Pathology Treatment  Patient Details  Name: Erik Salazar MRN: [id number] Date of Birth: 1951-03-04 Referring Provider: Dr. Margette Fast   Encounter Date: 09/17/2017  End of Session - 09/17/17 1151    Visit Number  2    Number of Visits  17    Date for SLP Re-Evaluation  11/08/17    SLP Start Time  0845    SLP Stop Time   0928    SLP Time Calculation (min)  43 min    Activity Tolerance  Patient tolerated treatment well       Past Medical History:  Diagnosis Date  . Carpal tunnel syndrome on right 07/08/2017  . Enlarged prostate   . H/O: knee surgery    15   . History of open heart surgery    ASCENDING AORTIC ANEURYSM  . 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 Seizure disorder ([place]) 04/05/2017  . Seizures ([place])   . Status post knee surgery    DVT POST KNEE SURGERY    Past Surgical History:  Procedure Laterality Date  . CARDIAC SURGERY     ANUERSYM MAY 2017   Bentall procedure. 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 CORONARY ARTERY BYPASS GRAFT  03/13/2017   LIMA to LAD with steril abcess removal from dacron graft  . FALSE ANEURYSM REPAIR  03/13/2017   at [facility]   . KNEE SURGERY  1983  . open heart surgery     03-13-2017  . REPLACEMENT TOTAL KNEE  2015  . ROTATOR CUFF REPAIR      There were no vitals filed for this visit.         ADULT SLP TREATMENT - 09/17/17 0852      General Information   Behavior/Cognition  Alert;Cooperative;Pleasant mood      Treatment Provided   Treatment provided  Cognitive-Linquistic      Pain Assessment   Pain Assessment  0-10    Pain Score  5     Pain Location  right wrist    Pain  Descriptors / Indicators  Aching;Constant    Pain Intervention(s)  Monitored during session      Cognitive-Linquistic Treatment   Treatment focused on  Cognition    Skilled Treatment  Facilitated divided attention with simple math reasoning problem and sparse intermittent simple  conversation  with extended time, pt required min A for mathmatical reasoning.  Visual memory strategies recalling details from picture with  usual min A with delay and distractor.       Assessment / Recommendations / Plan   Plan  Continue with current plan of care      General Recommendations   General recommendations  OT consult       SLP Education - 09/17/17 0935    Education provided  Yes    Education Details  memory strategies; attention/high level problem solving     Person(s) Educated  Patient    Methods  Explanation;Demonstration;Handout    Comprehension  Verbalized understanding       SLP Short Term Goals - 09/17/17 0937      SLP SHORT TERM GOAL #1   Title  Pt  will carryover 2 memory strategies at home with rare min A over 3 sessions, per pt report    Time  4    Period  Weeks    Status  On-going      SLP SHORT TERM GOAL #2   Title  Pt will divide attention between 2 milldly complex cognitive linguistic tasks with 90% on each and rare min A over 2 sessions    Time  4    Period  Weeks    Status  On-going      SLP SHORT TERM GOAL #3   Title  Pt will report completing daily memory activities at home with rare min A over 3 sessions    Time  4    Period  Weeks    Status  On-going       SLP Long Term Goals - 09/17/17 7353      SLP LONG TERM GOAL #1   Title  Pt will carryover 3 memory strategies at home/work over 3 sessions with rare min A per pt report    Time  8    Period  Weeks    Status  On-going      SLP LONG TERM GOAL #2   Title  Pt will divide attention between 2 complex cognitive lingusitic activites with 85% on each and occasional min A    Time  8    Period  Weeks    Status   On-going      SLP LONG TERM GOAL #3   Title  Pt will verbalize 3 compensations for attention for possbile return to work with occasional min A    Time  8    Period  Weeks    Status  On-going       Plan - 09/17/17 0935    Clinical Impression Statement  Pt verbalized "I now realize I  have more impaired than I thought" Pt with improved awareness of attention/high level reasoning/problem solving, as well as memory. Pt with excellent carryover of memory and cognitive activities at home. He required min A and extended time today for visual memory and simple diviided attention. 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     Speech Therapy Frequency  2x / week    Treatment/Interventions  SLP instruction and feedback;Cognitive reorganization;Functional tasks;Compensatory strategies;Patient/family education;Internal/external aids;Environmental controls    Potential to Achieve Goals  Good    Consulted and Agree with Plan of Care  Patient       Patient will benefit from skilled therapeutic intervention in order to improve the following deficits and impairments:   Cognitive communication deficit    Problem List Patient Active Problem List   Diagnosis Date Noted  . Carpal tunnel syndrome on right 07/08/2017  . Tremor, essential 07/08/2017  . 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 Seizure disorder ([place]) 04/05/2017  . Right arm pain 04/05/2017    Darcus Edds, Annye Rusk MS,C CC-SLP 09/17/2017, 11:52 AM  [facility] [address] [place], [place], [address] Phone: [phone]   Fax:  [phone]   Name: Erik Salazar MRN: [id number] Date of Birth: [birth year]

## 2017-09-18 NOTE — Telephone Encounter (Signed)
Echo ordered and message sent to schedulers./cy

## 2017-09-18 NOTE — Telephone Encounter (Signed)
Follow up   Patient says he is calling to see if his echo and cat scan was set up by [facility]

## 2017-09-19 NOTE — Telephone Encounter (Signed)
Called pt and he has already been scheduled for the Echocardiogram for 09/24/17. Pt aware and thanked me for the call.

## 2017-09-23 ENCOUNTER — Other Ambulatory Visit: Payer: Self-pay | Admitting: *Deleted

## 2017-09-23 DIAGNOSIS — I712 Thoracic aortic aneurysm, without rupture, unspecified: Secondary | ICD-10-CM

## 2017-09-23 NOTE — Progress Notes (Unsigned)
t

## 2017-09-24 ENCOUNTER — Ambulatory Visit (HOSPITAL_COMMUNITY): Payer: Medicare HMO | Attending: Cardiovascular Disease

## 2017-09-24 ENCOUNTER — Other Ambulatory Visit: Payer: Self-pay

## 2017-09-24 ENCOUNTER — Encounter (INDEPENDENT_AMBULATORY_CARE_PROVIDER_SITE_OTHER): Payer: Self-pay

## 2017-09-24 DIAGNOSIS — Z48812 Encounter for surgical aftercare following surgery on the circulatory system: Secondary | ICD-10-CM | POA: Insufficient documentation

## 2017-09-24 DIAGNOSIS — Z952 Presence of prosthetic heart valve: Secondary | ICD-10-CM | POA: Diagnosis not present

## 2017-09-24 DIAGNOSIS — Z8673 Personal history of transient ischemic attack (TIA), and cerebral infarction without residual deficits: Secondary | ICD-10-CM | POA: Insufficient documentation

## 2017-09-24 DIAGNOSIS — G40909 Epilepsy, unspecified, not intractable, without status epilepticus: Secondary | ICD-10-CM | POA: Diagnosis not present

## 2017-10-03 ENCOUNTER — Ambulatory Visit: Payer: Medicare HMO | Admitting: Speech Pathology

## 2017-10-08 ENCOUNTER — Encounter: Payer: Self-pay | Admitting: Speech Pathology

## 2017-10-08 ENCOUNTER — Ambulatory Visit: Payer: Medicare HMO | Attending: Neurology | Admitting: Speech Pathology

## 2017-10-08 DIAGNOSIS — R41841 Cognitive communication deficit: Secondary | ICD-10-CM | POA: Diagnosis not present

## 2017-10-08 NOTE — Therapy (Signed)
[facility] [address] [place], [place], [address] Phone: [phone]   Fax:  [phone]  Speech Language Pathology Treatment  Patient Details  Name: Erik Salazar MRN: [id number] Date of Birth: May 17, 1951 Referring Provider: Dr. Margette Fast   Encounter Date: 10/08/2017  End of Session - 10/08/17 1213    Visit Number  3    Number of Visits  17    Date for SLP Re-Evaluation  11/08/17    SLP Start Time  0845    SLP Stop Time   0930    SLP Time Calculation (min)  45 min    Activity Tolerance  Patient tolerated treatment well       Past Medical History:  Diagnosis Date  . Carpal tunnel syndrome on right 07/08/2017  . Enlarged prostate   . H/O: knee surgery    15   . History of open heart surgery    ASCENDING AORTIC ANEURYSM  . 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 Seizure disorder ([place]) 04/05/2017  . Seizures ([place])   . Status post knee surgery    DVT POST KNEE SURGERY    Past Surgical History:  Procedure Laterality Date  . CARDIAC SURGERY     ANUERSYM MAY 2017   Bentall procedure. 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 CORONARY ARTERY BYPASS GRAFT  03/13/2017   LIMA to LAD with steril abcess removal from dacron graft  . FALSE ANEURYSM REPAIR  03/13/2017   at [facility]   . KNEE SURGERY  1983  . open heart surgery     03-13-2017  . REPLACEMENT TOTAL KNEE  2015  . ROTATOR CUFF REPAIR      There were no vitals filed for this visit.  Subjective Assessment - 10/08/17 0913    Currently in Pain?  Yes    Pain Score  6     Pain Location  Back    Pain Onset  More than a month ago    Pain Frequency  Constant            ADULT SLP TREATMENT - 10/08/17 0913      General Information   Behavior/Cognition  Alert;Cooperative;Pleasant  mood      Treatment Provided   Treatment provided  Cognitive-Linquistic      Cognitive-Linquistic Treatment   Treatment focused on  Cognition    Skilled Treatment  High level reasoning/problem solving with extended time and occasional min to mod A - pt verbalizes frustration that he would have been able to complete these tasks with ease prior to CVA. Divided attention targeted on mildly complex card sort with multiple piles/rules for each pile and simple conversation. Pt required extended time for processing and recall of rules whten conversation was interjected.       Assessment / Recommendations / Plan   Plan  Continue with current plan of care      Progression Toward Goals   Progression toward goals  Progressing toward goals         SLP Short Term Goals - 10/08/17 1212      SLP SHORT TERM GOAL #1   Title  Pt will carryover 2 memory strategies at home with rare min A over 3 sessions, per pt report    Time  3    Period  Weeks    Status  On-going      SLP SHORT TERM GOAL #2   Title  Pt will divide attention between 2 milldly complex cognitive linguistic tasks with 90% on each and rare min A over 2 sessions    Time  3    Period  Weeks    Status  On-going      SLP SHORT TERM GOAL #3   Title  Pt will report completing daily memory activities at home with rare min A over 3 sessions    Time  3    Period  Weeks    Status  On-going       SLP Long Term Goals - 10/08/17 1213      SLP LONG TERM GOAL #1   Title  Pt will carryover 3 memory strategies at home/work over 3 sessions with rare min A per pt report    Time  7    Period  Weeks    Status  On-going      SLP LONG TERM GOAL #2   Title  Pt will divide attention between 2 complex cognitive lingusitic activites with 85% on each and occasional min A    Time  7    Period  Weeks    Status  On-going      SLP LONG TERM GOAL #3   Title  Pt will verbalize 3 compensations for attention for possbile return to work with occasional  min A    Time  7    Period  Weeks    Status  On-going       Plan - 10/08/17 1211    Clinical Impression Statement  Pt continues to demonstrate reduced attention and memory as well as high level reasoning/problem solving. He verbalizes frustration with his difficulty in therapy tasks. Min to mod A for divided attention. Continue skilled ST to maximize cognition for possible return to work.     Speech Therapy Frequency  2x / week    Treatment/Interventions  SLP instruction and feedback;Cognitive reorganization;Functional tasks;Compensatory strategies;Patient/family education;Internal/external aids;Environmental controls    Potential to Achieve Goals  Good    Consulted and Agree with Plan of Care  Patient       Patient will benefit from skilled therapeutic intervention in order to improve the following deficits and impairments:   Cognitive communication deficit    Problem List Patient Active Problem List   Diagnosis Date Noted  . Carpal tunnel syndrome on right 07/08/2017  . Tremor, essential 07/08/2017  . 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 Seizure disorder ([place]) 04/05/2017  . Right arm pain 04/05/2017    Kanin Lia, Annye Rusk MS, CCC-SLP 10/08/2017, 12:14 PM  [facility] [address] [place], [place], [address] Phone: [phone]   Fax:  [phone]   Name: Yuvraj Pfeifer MRN: [id number] Date of Birth: 20-Apr-1951

## 2017-10-10 ENCOUNTER — Ambulatory Visit: Payer: Medicare HMO | Admitting: Speech Pathology

## 2017-10-10 ENCOUNTER — Encounter: Payer: Self-pay | Admitting: Speech Pathology

## 2017-10-10 ENCOUNTER — Telehealth: Payer: Self-pay | Admitting: Neurology

## 2017-10-10 DIAGNOSIS — R41841 Cognitive communication deficit: Secondary | ICD-10-CM | POA: Diagnosis not present

## 2017-10-10 NOTE — Telephone Encounter (Signed)
Pt would like a call back regarding being able to drive. Best call back is [phone]

## 2017-10-10 NOTE — Telephone Encounter (Signed)
The patient has not had any seizures since July 2018 following surgery, okay for him to operate a motor vehicle.

## 2017-10-10 NOTE — Therapy (Signed)
[facility] [address] [place], [place], [address] Phone: [phone]   Fax:  [phone]  Speech Language Pathology Treatment  Patient Details  Name: Erik Salazar MRN: [id number] Date of Birth: Feb 10, 1951 Referring Provider: Dr. Margette Fast   Encounter Date: 10/10/2017  End of Session - 10/10/17 0930    Visit Number  4    Number of Visits  17    Date for SLP Re-Evaluation  11/08/17    SLP Start Time  0845    SLP Stop Time   0927    SLP Time Calculation (min)  42 min    Activity Tolerance  Patient tolerated treatment well       Past Medical History:  Diagnosis Date  . Carpal tunnel syndrome on right 07/08/2017  . Enlarged prostate   . H/O: knee surgery    15   . History of open heart surgery    ASCENDING AORTIC ANEURYSM  . Ischemic stroke of frontal lobe ([place]) 04/05/2017   Bilateral  . Seizure disorder ([place]) 04/05/2017  . Seizures ([place])   . Status post knee surgery    DVT POST KNEE SURGERY    Past Surgical History:  Procedure Laterality Date  . CARDIAC SURGERY     ANUERSYM MAY 2017   Bentall procedure. 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 CORONARY ARTERY BYPASS GRAFT  03/13/2017   LIMA to LAD with steril abcess removal from dacron graft  . FALSE ANEURYSM REPAIR  03/13/2017   at [facility]   . KNEE SURGERY  1983  . open heart surgery     03-13-2017  . REPLACEMENT TOTAL KNEE  2015  . ROTATOR CUFF REPAIR      There were no vitals filed for this visit.  Subjective Assessment - 10/10/17 0848    Subjective  "I do find my self asking myself why I just came into the room"    Pain Score  7     Pain Location  Back    Pain Descriptors / Indicators  Aching    Pain Type  Chronic pain    Pain Onset  More than a month ago    Pain Frequency  Constant             ADULT SLP TREATMENT - 10/10/17 0900      General Information   Behavior/Cognition  Alert;Cooperative;Pleasant mood      Cognitive-Linquistic Treatment   Treatment focused on  Cognition    Skilled Treatment  Divided attnetion on mildly complex card sort, while performing auditory/verbal task - aphabetizing and money counting tasks with 90% accuracy on each and occasional min repeat or visual cue required for each task. Moderately complex functional math reasoning completed timely and with mod I.       Progression Toward Goals   Progression toward goals  Progressing toward goals         SLP Short Term Goals - 10/10/17 0930      SLP SHORT TERM GOAL #1   Title  Pt will carryover 2 memory strategies at home with rare min A over 3 sessions, per pt report    Time  3    Period  Weeks    Status  On-going      SLP SHORT TERM GOAL #2  Title  Pt will divide attention between 2 milldly complex cognitive linguistic tasks with 90% on each and rare min A over 2 sessions    Time  3    Period  Weeks    Status  On-going      SLP SHORT TERM GOAL #3   Title  Pt will report completing daily memory activities at home with rare min A over 3 sessions    Time  3    Period  Weeks    Status  On-going       SLP Long Term Goals - 10/10/17 0930      SLP LONG TERM GOAL #1   Title  Pt will carryover 3 memory strategies at home/work over 3 sessions with rare min A per pt report    Time  7    Period  Weeks    Status  On-going      SLP LONG TERM GOAL #2   Title  Pt will divide attention between 2 complex cognitive lingusitic activites with 85% on each and occasional min A    Time  7    Period  Weeks    Status  On-going      SLP LONG TERM GOAL #3   Title  Pt will verbalize 3 compensations for attention for possbile return to work with occasional min A    Time  7    Period  Weeks    Status  On-going       Plan - 10/10/17 0929    Clinical Impression Statement  Pt continues to  demonstrate mildly reduced divided attention and memory as well as high level reasoning/problem solving. He verbalizes frustration with his difficulty in therapy tasks. Min to mod A for divided attention. Continue skilled ST to maximize cognition for possible return to work.     Speech Therapy Frequency  2x / week    Treatment/Interventions  SLP instruction and feedback;Cognitive reorganization;Functional tasks;Compensatory strategies;Patient/family education;Internal/external aids;Environmental controls    Potential to Achieve Goals  Good       Patient will benefit from skilled therapeutic intervention in order to improve the following deficits and impairments:   Cognitive communication deficit    Problem List Patient Active Problem List   Diagnosis Date Noted  . Carpal tunnel syndrome on right 07/08/2017  . Tremor, essential 07/08/2017  . Ischemic stroke of frontal lobe ([place]) 04/05/2017  . Seizure disorder ([place]) 04/05/2017  . Right arm pain 04/05/2017    Erik Salazar, Erik Rusk MS, CCC-SLP 10/10/2017, 9:31 AM  [facility] [address] [place], [place], [address] Phone: [phone]   Fax:  [phone]   Name: Erik Salazar MRN: [id number] Date of Birth: 1951-05-07

## 2017-10-15 ENCOUNTER — Other Ambulatory Visit: Payer: Self-pay | Admitting: Neurology

## 2017-10-16 ENCOUNTER — Encounter: Payer: Self-pay | Admitting: Cardiothoracic Surgery

## 2017-10-16 ENCOUNTER — Ambulatory Visit
Admission: RE | Admit: 2017-10-16 | Discharge: 2017-10-16 | Disposition: A | Discharge status: Home / Self Care | Payer: Medicare HMO | Source: Ambulatory Visit | Attending: Cardiothoracic Surgery | Admitting: Cardiothoracic Surgery

## 2017-10-16 ENCOUNTER — Ambulatory Visit (INDEPENDENT_AMBULATORY_CARE_PROVIDER_SITE_OTHER): Payer: Medicare HMO | Admitting: Cardiothoracic Surgery

## 2017-10-16 VITALS — BP 152/84 | HR 96 | Resp 20 | Ht 68.0 in | Wt 183.0 lb

## 2017-10-16 DIAGNOSIS — I712 Thoracic aortic aneurysm, without rupture, unspecified: Secondary | ICD-10-CM

## 2017-10-16 DIAGNOSIS — Z9889 Other specified postprocedural states: Secondary | ICD-10-CM

## 2017-10-16 DIAGNOSIS — Z8679 Personal history of other diseases of the circulatory system: Secondary | ICD-10-CM

## 2017-10-16 MED ORDER — IOPAMIDOL (ISOVUE-370) INJECTION 76%
75.0000 mL | Freq: Once | INTRAVENOUS | Status: AC | PRN
  Administered 2017-10-16: 75 mL via INTRAVENOUS

## 2017-10-16 NOTE — Progress Notes (Signed)
PCP is [facility], [place] Referring Provider is Jerline Pain, MD  Chief Complaint  Patient presents with  . Thoracic Aortic Aneurysm    6 month f/u with CTA Chest    HPI: Patient presents for follow-up with CTA of thoracic aorta as well as recent echocardiogram performed by Dr. Marlou Porch.  Patient had resection of a ascending aneurysm with Bentall procedure and CABG  in [place] in 2017.  He moved to this area and establish with Dr. Marlou Porch for cardiology follow-up.  I was asked to follow his surgical disease and a CTA performed.  This demonstrated a probable false aneurysm of the distal suture line of the aortic graft.  It measures over 6 cm in length.  Patient after just having moved here was appropriately set and asked for a second opinion at [facility].  The patient subsequently underwent redo sternotomy in the summer 2018 at [facility].  A 9 cm abscess collection along the graft was found.  There is no false aneurysm.  Gram stain of this area was negative.  The abscess was debrided and the space was filled with omental flap performed at the same time by plastic surgery.  Patient also had a repeat coronary graft using left IMA to LAD for a stenotic vein  graft from the original operation. Patient had a postoperative CVA with expressive aphasia and some swallowing difficulty.  He is being followed here by Dr. [name].  He has undergone physical therapy and speech therapy.  He has improved and now has a fairly good functional status.  He remains active with daily aerobic fitness center activities and minimal weight lifting.  He has declined follow-up at [facility] and wishes to be followed [facility] for his aortic cardiac surgery.  CTA today shows resolution of the collection along the right side of the ascending aortic graft.  The ascending aortic graft arch and descending thoracic aorta all all 3 cm or less.  There is no false aneurysm.  Recent echocardiogram images were reviewed which shows normal  LV function, normal functioning of the prosthetic aortic valve.  The patient is in sinus rhythm.  Following the CTA injection today at the radiology center the patient developed extravasation of the dye into his right arm at the antecubital fossa with swelling and redness.  He presents the office with a cold pack on his arm.  Past Medical History:  Diagnosis Date  . Carpal tunnel syndrome on right 07/08/2017  . Enlarged prostate   . H/O: knee surgery    15   . History of open heart surgery    ASCENDING AORTIC ANEURYSM  . Ischemic stroke of frontal lobe ([place]) 04/05/2017   Bilateral  . Seizure disorder ([place]) 04/05/2017  . Seizures ([place])   . Status post knee surgery    DVT POST KNEE SURGERY    Past Surgical History:  Procedure Laterality Date  . CARDIAC SURGERY     ANUERSYM MAY 2017   Bentall procedure. 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 CORONARY ARTERY BYPASS GRAFT  03/13/2017   LIMA to LAD with steril abcess removal from dacron graft  . FALSE ANEURYSM REPAIR  03/13/2017   at [facility]   . KNEE SURGERY  1983  . open heart surgery     03-13-2017  . REPLACEMENT TOTAL  KNEE  2015  . ROTATOR CUFF REPAIR      Family History  Problem Relation Age of Onset  . Aneurysm Mother        brain aneurysm for mother.     Social History Social History   Tobacco Use  . Smoking status: Never Smoker  . Smokeless tobacco: Never Used  Substance Use Topics  . Alcohol use: No  . Drug use: No    Current Outpatient Medications  Medication Sig Dispense Refill  . aspirin 325 MG tablet Take 325 mg by mouth daily.    Marland Kitchen levETIRAcetam (KEPPRA) 750 MG tablet TAKE TWO TABLETS BY MOUTH TWICE A DAY 120 tablet 2  . Metoprolol Succinate 25 MG CS24 Take 12.5 mg by mouth at bedtime. 30 capsule   . pravastatin (PRAVACHOL) 40 MG tablet Take 1 tablet (40 mg  total) by mouth every evening. 90 tablet 3  . tamsulosin (FLOMAX) 0.4 MG CAPS capsule Take 0.4 mg by mouth every evening.      No current facility-administered medications for this visit.     Allergies  Allergen Reactions  . Oxycodone Hcl     DELUSIONAL  . Oxycontin [Oxycodone]     DELUSIONAL   . Percocet [Oxycodone-Acetaminophen]     DELUSIONAL  . Sulfur Rash    Review of Systems  Weight stable No dizziness Some difficulty with memory and expressive aphasia persist No focal weakness All surgical incisions well-healed No pedal edema No abdominal pain or swelling  BP (!) 152/84   Pulse 96   Resp 20   Ht 5\' 8"  (1.727 m)   Wt 183 lb (83 kg)   SpO2 98% Comment: RA  BMI 27.83 kg/m  Physical Exam      Exam    General- alert and comfortable    Neck- no JVD, no cervical adenopathy palpable, no carotid bruit   Lungs- clear without rales, wheezes   Cor- regular rate and rhythm, no murmur , gallop   Abdomen- soft, non-tender   Extremities - warm, non-tender, minimal edema   Neuro- oriented, appropriate, no focal weakness  Diagnostic Tests: CT images personally reviewed and show normal postoperative reconstruction of the ascending aorta and aortic arch  Impression: Doing well after redo aortic surgery this past summer.  Plan: Continue current medications and activities. Continue heart healthy diet and lifestyle. He understands heavy weight lifting would be dangerous to his thoracic aortic disease. Patient will return for follow-up with CTA in 1 year  Len Childs, MD [facility] [phone]

## 2017-10-18 ENCOUNTER — Telehealth: Payer: Self-pay

## 2017-10-18 NOTE — Telephone Encounter (Signed)
Spoke with Erik Salazar after seeing Dr. Prescott Gum in the office Wednesday.  Patient had a CT done before appointment to which his IV infiltrated and cause a large swollen area on his arm.  Called him to check to see how it was today.  He stated that it was some better and he was taking his last dose of prednisone today.  He requested more; however, I told him that once he completed the course, he did not need anymore medication.  He acknowledged receipt.  He stated that he was keeping his arm elevated and using cold compresses.

## 2017-10-18 NOTE — Telephone Encounter (Signed)
-----   Message from Laury Deep, RN sent at 10/16/2017  2:43 PM EST ----- Marykay Lex,  Can you call this patient Friday sometime to check on his arm??  CT was done today before PVT visit and it was huge afterwards.  PVT gave him prednisone and antibiotics but I think it would be nice to check on patient b/c he was upset with [facility].  He told them it was hurting (IV site) but they didn't pay him much attention.  Thanks so much,  [employer]

## 2017-10-21 ENCOUNTER — Telehealth: Payer: Self-pay | Admitting: Neurology

## 2017-10-21 ENCOUNTER — Telehealth: Payer: Self-pay | Admitting: Radiology

## 2017-10-21 NOTE — Telephone Encounter (Signed)
I called Mr. Altergott today to follow up with him regarding a contrast extravasation he experienced 10/16/17 at [place].    I had seen him at [place] on 10/17/17 and examined his arm.  He had excellent capillary refill and sensation was intact in all five digits of his right hand.  The lateral soft tissues about he right elbow were somewhat firm compared to the left.  There was mild erythema.  I contacted him by phone on 10/18/17.  At that time he reported improvement in the area with some residual swelling.  He did not complain of any tingling or paresthesias.    Today he expressed frustration that he is still having symptoms.  He has had progressive paresthesias in his right hand.  He spoke with Dr. [name] earlier today who told him that this likely is related to irritation of the median nerve either secondary to the pressure or chemical irritation.  He was told that he could expect 2-3 weeks for full recovery.  I expressed my concern for him.  He has been on steroids and antibiotics.    At this point there is no other treatment for him other than watchful waiting.  Dr. [name] had told him the same.  I asked if there was anything else I could do for him and he said there was not.  The patient has instructions to follow up with Dr. [name] if the paresthesias do not improve as expected.

## 2017-10-21 NOTE — Telephone Encounter (Signed)
Patient had a CT scan with contrast last Thursday and a few fingers on his right hand are still very painful, feels like they are on fire and tingling. He saw Dr. Nils Pyle the same day of CT scan  and was given steroid Rx and antibiotic for this. Patient thinks he needs to see Dr. [name] because symptoms are still there.

## 2017-10-21 NOTE — Telephone Encounter (Signed)
I called the patient.  The patient had extravasation of x-ray dye on a angiographic procedure on his right arm.  This was done at the elbow.  The patient has had some numbness and paresthesias involving the hand following the procedure, may have pressure as well as chemical irritation of the median nerve.  If the patient is not improving over the next couple weeks I will be happy to see him in office.  This apparently occurred with a study that was done on 16 October 2017.

## 2017-10-22 ENCOUNTER — Ambulatory Visit: Payer: Medicare HMO

## 2017-10-24 ENCOUNTER — Encounter: Payer: Medicare HMO | Admitting: Speech Pathology

## 2017-10-24 ENCOUNTER — Telehealth: Payer: Self-pay | Admitting: Neurology

## 2017-10-24 NOTE — Telephone Encounter (Signed)
I called the patient.  I talk with the wife, the patient was not available, I will call back later.

## 2017-10-24 NOTE — Telephone Encounter (Signed)
I called the patient.  The patient is having problems locating his car when he parks secondary to memory problems, he indicates that he does not have problems with driving the car.  I will give him a disability placard.  The patient may start tapering his Keppra, he will go down by 1 tablet every 3 weeks until off the medication.  He will follow-up in May.

## 2017-10-24 NOTE — Telephone Encounter (Signed)
Pt has returned the call to Dr [name], Dr [name] is with a pt.  Pt will await a call back

## 2017-10-25 NOTE — Telephone Encounter (Signed)
I called pt, advised him that his form for a disability placard is ready for pick up at the front desk and I gave him the clinic hours. Pt verbalized understanding.

## 2017-10-28 ENCOUNTER — Ambulatory Visit (INDEPENDENT_AMBULATORY_CARE_PROVIDER_SITE_OTHER): Payer: Medicare HMO | Admitting: Cardiology

## 2017-10-28 ENCOUNTER — Encounter: Payer: Self-pay | Admitting: Cardiology

## 2017-10-28 ENCOUNTER — Telehealth: Payer: Self-pay | Admitting: Cardiology

## 2017-10-28 ENCOUNTER — Encounter (INDEPENDENT_AMBULATORY_CARE_PROVIDER_SITE_OTHER): Payer: Self-pay

## 2017-10-28 VITALS — BP 124/84 | HR 88 | Ht 69.0 in | Wt 180.1 lb

## 2017-10-28 DIAGNOSIS — Z952 Presence of prosthetic heart valve: Secondary | ICD-10-CM

## 2017-10-28 DIAGNOSIS — I2510 Atherosclerotic heart disease of native coronary artery without angina pectoris: Secondary | ICD-10-CM | POA: Diagnosis not present

## 2017-10-28 DIAGNOSIS — I493 Ventricular premature depolarization: Secondary | ICD-10-CM

## 2017-10-28 DIAGNOSIS — Z951 Presence of aortocoronary bypass graft: Secondary | ICD-10-CM | POA: Diagnosis not present

## 2017-10-28 DIAGNOSIS — Z9889 Other specified postprocedural states: Secondary | ICD-10-CM

## 2017-10-28 DIAGNOSIS — Z8679 Personal history of other diseases of the circulatory system: Secondary | ICD-10-CM | POA: Diagnosis not present

## 2017-10-28 DIAGNOSIS — I2583 Coronary atherosclerosis due to lipid rich plaque: Secondary | ICD-10-CM

## 2017-10-28 MED ORDER — METOPROLOL SUCCINATE 25 MG PO CS24: 25.0000 mg | EXTENDED_RELEASE_CAPSULE | Freq: Every day | ORAL | 11 refills | Status: DC

## 2017-10-28 MED ORDER — METOPROLOL SUCCINATE ER 25 MG PO TB24: 25.0000 mg | ORAL_TABLET | Freq: Every day | ORAL | 3 refills | Status: DC

## 2017-10-28 NOTE — Telephone Encounter (Signed)
Advised pharmacy tablet form of Metoprolol is fine to fill.  Corrected medication list to reflex tablet form.

## 2017-10-28 NOTE — Progress Notes (Signed)
Cardiology Office Note:    Date:  10/28/2017   ID:  Erik Salazar, DOB 02/13/1951, MRN [id number]  PCP:  [name]  Cardiologist:  No primary care provider on file.   Referring MD: [name]     History of Present Illness:    Erik Salazar is a 67 y.o. 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 After moving here, a follow up CTA bu Dr. 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  It measures over 6 cm in length.  Patient asked for a second opinion at [facility].  The patient subsequently underwent redo sternotomy in the summer 2018 at [facility].  A 9 cm abscess collection along the graft was found.  There is no false aneurysm.  Gram stain of this area was negative.  The abscess was debrided and the space was filled with omental flap performed at the same time by plastic surgery.  Patient also had a repeat coronary graft using left IMA to LAD for a stenotic vein graft from the original operation.  He had a postoperative CVA with expressive aphasia and some swallowing difficulty.  He is being followed here by Dr. [name].  He has undergone physical therapy and speech therapy.  He has improved and now has a fairly good functional status. He has declined follow-up at [facility] and wishes to be followed [facility] for his aortic cardiac surgery.  Follow up CT shows resolution of abscess (had dye extravasation). ECHO with normal AV function. Plan is to have one year CTA by Dr. [name].   Dr. [name] has given him a disability placard. He indicated that he has trouble locating his car secondary to memory problems. He said he had no problem driving. He was instructed to taper off Keppra.  10/28/17-sinus rhythm 80 with occasional bigeminy noted on his EKG. he is doing well exercising 6 days a week, no syncope, no orthopnea, no chest pain.  He did still state that he has some  numbness in his first 3 finger distribution.  Wearing a brace.  Past Medical History:  Diagnosis Date  . Carpal tunnel syndrome on right 07/08/2017  . Enlarged prostate   . H/O: knee surgery    15   . History of open heart surgery    ASCENDING AORTIC ANEURYSM  . Ischemic stroke of frontal lobe ([place]) 04/05/2017   Bilateral  . Seizure disorder ([place]) 04/05/2017  . Seizures ([place])   . Status post knee surgery    DVT POST KNEE SURGERY    Past Surgical History:  Procedure Laterality Date  . CARDIAC SURGERY     ANUERSYM MAY 2017   Bentall procedure. 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 CORONARY ARTERY BYPASS GRAFT  03/13/2017   LIMA to LAD with steril abcess removal from dacron graft  . FALSE ANEURYSM REPAIR  03/13/2017   at [facility]   . KNEE SURGERY  1983  . open heart surgery     03-13-2017  . REPLACEMENT TOTAL KNEE  2015  . ROTATOR CUFF REPAIR      Current Medications: Current Meds  Medication Sig  . aspirin 325 MG tablet Take 325 mg by mouth daily.  Marland Kitchen  levETIRAcetam (KEPPRA) 750 MG tablet TAKE TWO TABLETS BY MOUTH TWICE A DAY  . Metoprolol Succinate 25 MG CS24 Take 25 mg by mouth at bedtime.  . pravastatin (PRAVACHOL) 40 MG tablet Take 1 tablet (40 mg total) by mouth every evening.  . tamsulosin (FLOMAX) 0.4 MG CAPS capsule Take 0.4 mg by mouth every evening.   . [DISCONTINUED] Metoprolol Succinate 25 MG CS24 Take 12.5 mg by mouth at bedtime.     Allergies:   Oxycodone hcl; Oxycontin [oxycodone]; Percocet [oxycodone-acetaminophen]; and Sulfur   Social History   Socioeconomic History  . Marital status: Married    Spouse name: None  . Number of children: None  . Years of education: None  . Highest education level: None  Social Needs  . Financial resource strain: None  . Food insecurity - worry: None  . Food insecurity -  inability: None  . Transportation needs - medical: None  . Transportation needs - non-medical: None  Occupational History  . Occupation: RETIRED  Tobacco Use  . Smoking status: Never Salazar  . Smokeless tobacco: Never Used  Substance and Sexual Activity  . Alcohol use: No  . Drug use: No  . Sexual activity: Yes  Other Topics Concern  . None  Social History Narrative   Lives at home with wife, is retired.  Education: 2 yrs college.   2 Children.     Family History: The patient's family history includes Aneurysm in his mother.  ROS:   Please see the history of present illness.     All other systems reviewed and are negative.  EKGs/Labs/Other Studies Reviewed:    The following studies were reviewed today:  ECHO: 09/24/17 - Left ventricle: The cavity size was normal. There was mild focal   basal hypertrophy of the septum. Systolic function was normal.   The estimated ejection fraction was in the range of 60% to 65%.   Wall motion was normal; there were no regional wall motion   abnormalities. Doppler parameters are equivocal for elevated mean   left atrial filling pressure. - Ventricular septum: Septal motion showed paradox. These changes   are consistent with a post-thoracotomy state. - Aortic valve: A biopros prosthesis was present and functioning   normally. - Mitral valve: Calcified annulus. Mildly thickened leaflets .   Systolic bowing without prolapse. - Left atrium: The atrium was mildly dilated. - Pulmonary arteries: Systolic pressure was mildly increased. PA   peak pressure: 34 mm Hg (S).  EKG:  EKG is ordered today.  The ekg ordered today demonstrates sinus rhythm, frequent PVCs, bigeminy, right bundle branch block, 88  Recent Labs: 12/13/2016: ALT 13; TSH 1.060 03/28/2017: BUN 18; Creatinine, Ser 0.99; Hemoglobin 11.9; Platelets 418; Potassium 4.4; Sodium 132  Recent Lipid Panel    Component Value Date/Time   CHOL 199 12/13/2016 0942   TRIG 166 (H) 12/13/2016  0942   HDL 45 12/13/2016 0942   CHOLHDL 4.4 12/13/2016 0942   LDLCALC 121 (H) 12/13/2016 0942    Physical Exam:    VS:  BP 124/84   Pulse 88   Ht 5\' 9"  (1.753 m)   Wt 180 lb 1.9 oz (81.7 kg)   BMI 26.60 kg/m     Wt Readings from Last 3 Encounters:  10/28/17 180 lb 1.9 oz (81.7 kg)  10/16/17 183 lb (83 kg)  07/19/17 170 lb 3.1 oz (77.2 kg)    GEN: Well nourished, well developed, in no acute distress  HEENT: normal  Neck: no JVD,  carotid bruits, or masses Cardiac: RRR; soft systolic murmur, no rubs, or gallops,no edema  Respiratory:  clear to auscultation bilaterally, normal work of breathing GI: soft, nontender, nondistended, + BS MS: no deformity or atrophy  Skin: warm and dry, no rash, right wrist brace.  Neuro:  Alert and Oriented x 3, Strength and sensation are intact, mild numbness 1-3 right fingers.  Psych: euthymic mood, full affect   ASSESSMENT:    1. Coronary artery disease due to lipid rich plaque   2. S/P AVR   3. H/O aortic valve repair   4. S/P CABG x 2   5. History of aortic root repair   6. PVC (premature ventricular contraction)    PLAN:    In order of problems listed above:  Ascending aortic aneurysm repair status post Bentall procedure and bioprosthetic aortic valve replacementWith redo sternotomy secondary to a false aneurysm noted at the distal suture line.   - Dental prophylaxis  - Aspirin 325, okay to continue at this time given his bioprosthetic aortic valve and CABG and prior stroke.  Overall have been doing quite well with exercise.  Feeling great.  CAD  - LIMA to LAD on 03/13/17/redo sternotomy with sterile abscess removal, Dacron graft  - SVG from aorta to LAD and SVG to circumflex previously on 01/04/16 with SVG to mid LAD 90% stenosis and subsequent LIMA to LAD placed. SVG to OM 2 was patent.  -No anginal symptoms  Premature ventricular contractions -Quadrigeminy and bigeminy seen on ECG.  I will increase his Toprol from 12.5-25.  He  is asymptomatic with these.  Hyperlipidemia  - Continue with pravastatin. Lipids are being checked by Dr. Olen Pel  History of CVA  - post op. Dr. [name].     Medication Adjustments/Labs and Tests Ordered: Current medicines are reviewed at length with the patient today.  Concerns regarding medicines are outlined above.  Orders Placed This Encounter  Procedures  . EKG 12-Lead   Meds ordered this encounter  Medications  . Metoprolol Succinate 25 MG CS24    Sig: Take 25 mg by mouth at bedtime.    Dispense:  30 capsule    Refill:  11    Signed, Candee Furbish, MD  10/28/2017 9:43 AM    [place]

## 2017-10-28 NOTE — Patient Instructions (Addendum)
Medication Instructions:  Please increase your Metoprolol to 25 mg daily.  Continue all other medications as listed.  Follow-Up: Follow up in 6 months with Dr. Marlou Porch.  You will receive a letter in the mail 2 months before you are due.  Please call us when you receive this letter to schedule your follow up appointment.  If you need a refill on your cardiac medications before your next appointment, please call your pharmacy.  Thank you for choosing [facility]!!

## 2017-10-28 NOTE — Telephone Encounter (Signed)
[facility] calling requesting a change in Metoprolol succinate 25 mg CS 24 that was sent in today. Pharmacy stated that pt was taking metoprolol succinate (Toprol XL) 25 mg  tablet before that is less expensive and would like to see if Dr. Marlou Porch could send it in instead. Please address.

## 2017-10-29 ENCOUNTER — Ambulatory Visit: Payer: Medicare HMO | Admitting: Speech Pathology

## 2017-10-31 ENCOUNTER — Encounter: Payer: Medicare HMO | Admitting: Speech Pathology

## 2017-11-04 ENCOUNTER — Telehealth: Payer: Self-pay | Admitting: Cardiology

## 2017-11-04 NOTE — Telephone Encounter (Signed)
Pt's pharmacy is requesting a refill on Tamsulosin 0.4 mg tablet. Would Dr. Marlou Porch like to refill this medication? Please address

## 2017-11-06 ENCOUNTER — Ambulatory Visit: Payer: Medicare HMO | Admitting: Speech Pathology

## 2017-11-06 NOTE — Telephone Encounter (Signed)
Will need to obtain from PCP or urology.

## 2017-11-06 NOTE — Telephone Encounter (Signed)
Called pharmacy to informed them that pt has to request medication from his PCP. Pharmacist stated that he has already picked medication up, that was sent in from another provider.

## 2017-11-07 DIAGNOSIS — Z23 Encounter for immunization: Secondary | ICD-10-CM | POA: Diagnosis not present

## 2017-11-11 ENCOUNTER — Telehealth: Payer: Self-pay | Admitting: Cardiology

## 2017-11-11 MED ORDER — METOPROLOL SUCCINATE ER 25 MG PO TB24: 12.5000 mg | ORAL_TABLET | Freq: Every day | ORAL | 3 refills | Status: DC

## 2017-11-11 NOTE — Telephone Encounter (Signed)
Pt aware and will take 1/2 tablet daily.

## 2017-11-11 NOTE — Telephone Encounter (Signed)
Agree with Metop succ 12.5 QD.  Candee Furbish, MD

## 2017-11-11 NOTE — Telephone Encounter (Signed)
New Message    Pt c/o BP issue:  1. What are your last 5 BP readings? 99/54, 101/66, 88/54, 100/66 2. Are you having any other symptoms (ex. Dizziness, headache, blurred vision, passed out)? Lightheaded  3. What is your medication issue? metoprolol succinate (TOPROL-XL) 25 MG 24 hr tablet Patient states that since his medication was increased his BP has been low and he has felt lightheaded. Please call to discuss.

## 2017-11-11 NOTE — Telephone Encounter (Signed)
Pt calling c/o weakness, lightheadedness and dizziness since increasing metoprolol to 25 mg a day on 10/28/17.  BP has been as listed above.  HR has been 62-86 bpm.  He denies any palps.  He reports having this same reaction last time he was on 25 mg daily.  He states he is going decrease the medication to 12.5 mg a day.  Advised I will forward this information to Dr Marlou Porch for review and orders.  Pt is aware I will c/b once I hear back from Dr Marlou Porch.

## 2017-11-13 NOTE — Telephone Encounter (Signed)
I called the patient, I will be seeing him tomorrow for revisit, he is still having pain and paresthesias in the arm.  He may require EMG and nerve conduction study evaluation.

## 2017-11-13 NOTE — Telephone Encounter (Signed)
FYI-Called pt back. Advised I see from previous conversation with Dr. [name] that if he continued to have sx he would like to see him back in the office. I scheduled work in appt for 11/14/17 at 53am. He verbalized understanding and appreciation for call.

## 2017-11-13 NOTE — Telephone Encounter (Signed)
Pt has asked a message be forwarded for Dr [name] to know that it has almost been a month and from the injection @ the imaging center he is still experiencing numbness and tingling in his fingers in right hand.  Please call

## 2017-11-14 ENCOUNTER — Encounter: Payer: Self-pay | Admitting: Neurology

## 2017-11-14 ENCOUNTER — Encounter (INDEPENDENT_AMBULATORY_CARE_PROVIDER_SITE_OTHER): Payer: Self-pay

## 2017-11-14 ENCOUNTER — Ambulatory Visit (INDEPENDENT_AMBULATORY_CARE_PROVIDER_SITE_OTHER): Payer: Medicare HMO | Admitting: Neurology

## 2017-11-14 DIAGNOSIS — M5412 Radiculopathy, cervical region: Secondary | ICD-10-CM

## 2017-11-14 HISTORY — DX: Radiculopathy, cervical region: M54.12

## 2017-11-14 MED ORDER — PREDNISONE 10 MG PO TABS
ORAL_TABLET | ORAL | 0 refills | Status: DC
Start: 2017-11-14 — End: 2017-11-27

## 2017-11-14 NOTE — Patient Instructions (Signed)
   We will start prednisone 10 mg dose pack, call if the symptoms do not improve.

## 2017-11-14 NOTE — Progress Notes (Signed)
Reason for visit: Right arm pain  Erik Salazar is an 67 y.o. male  History of present illness:  Erik Salazar is a 67 year old right-handed white male with a history of cerebrovascular disease and a history of rheumatoid arthritis.  The patient has been seen previously with right arm discomfort, EMG and nerve conduction study done in September 2018 showed evidence of a mild right carpal tunnel syndrome and evidence of an overlying acute C6 radiculopathy.  The patient improved from his pain and had done quite well until 16 October 2017.  The patient underwent an angiographic procedure and x-ray dye was injected into the vein on the right arm, the patient indicates that the injection site was in the lateral portion of the elbow.  The dye extravasated causing significant swelling.  The patient was treated with ice packs and steroids for 3 days.  The swelling has gone down, 1 month later he still has numbness and discomfort in the right arm and forearm.  He reports numbness in the lateral aspect of the forearm, and the thumb and second and third fingers both on the palmar side as well as the back of the hand.  The patient feels somewhat weak in the hand.  He indicates that he can turn his head to the right and induce tingling sensations down the arm.  The patient comes into the office today for evaluation of his recurring right arm discomfort.  Past Medical History:  Diagnosis Date  . Carpal tunnel syndrome on right 07/08/2017  . Enlarged prostate   . H/O: knee surgery    15   . History of open heart surgery    ASCENDING AORTIC ANEURYSM  . Ischemic stroke of frontal lobe ([place]) 04/05/2017   Bilateral  . Seizure disorder ([place]) 04/05/2017  . Seizures ([place])   . Status post knee surgery    DVT POST KNEE SURGERY    Past Surgical History:  Procedure Laterality Date  . CARDIAC SURGERY     ANUERSYM MAY 2017   Bentall procedure. 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 CORONARY ARTERY BYPASS GRAFT  03/13/2017   LIMA to LAD with steril abcess removal from dacron graft  . FALSE ANEURYSM REPAIR  03/13/2017   at [facility]   . KNEE SURGERY  1983  . open heart surgery     03-13-2017  . REPLACEMENT TOTAL KNEE  2015  . ROTATOR CUFF REPAIR      Family History  Problem Relation Age of Onset  . Aneurysm Mother        brain aneurysm for mother.     Social history:  reports that he has never smoked. He has never used smokeless tobacco. He reports that he does not drink alcohol or use drugs.    Allergies  Allergen Reactions  . Oxycodone Hcl     DELUSIONAL  . Oxycontin [Oxycodone]     DELUSIONAL   . Percocet [Oxycodone-Acetaminophen]     DELUSIONAL  . Sulfur Rash    Medications:  Prior to Admission medications   Medication Sig Start Date End Date Taking? Authorizing Provider  aspirin 325 MG tablet Take 325 mg by mouth daily.   Yes [provider]  levETIRAcetam (KEPPRA) 750 MG tablet TAKE TWO TABLETS BY MOUTH TWICE A DAY 10/16/17  Yes [name],  Elon Alas, MD  metoprolol succinate (TOPROL-XL) 25 MG 24 hr tablet Take 0.5 tablets (12.5 mg total) by mouth daily. 11/11/17  Yes Jerline Pain, MD  pravastatin (PRAVACHOL) 40 MG tablet Take 1 tablet (40 mg total) by mouth every evening. 05/29/17 05/29/18 Yes Jerline Pain, MD  tamsulosin (FLOMAX) 0.4 MG CAPS capsule Take 0.4 mg by mouth every evening.    Yes [provider]  predniSONE (DELTASONE) 10 MG tablet Begin taking 6 tablets daily, taper by one tablet every other day until off the medication. 11/14/17   Kathrynn Ducking, MD    ROS:  Out of a complete 14 system review of symptoms, the patient complains only of the following symptoms, and all other reviewed systems are negative.  Numbness Aching muscles, muscle cramps  Blood pressure 137/73, pulse 79, height 5\' 10"  (1.778  m), weight 183 lb (83 kg).  Physical Exam  General: The patient is alert and cooperative at the time of the examination.  Skin: No significant peripheral edema is noted.   Neurologic Exam  Mental status: The patient is alert and oriented x 3 at the time of the examination. The patient has apparent normal recent and remote memory, with an apparently normal attention span and concentration ability.   Cranial nerves: Facial symmetry is present. Speech is normal, no aphasia or dysarthria is noted. Extraocular movements are full. Visual fields are full.  Motor: The patient has good strength in all 4 extremities.  Sensory examination: Soft touch sensation is symmetric on the face, arms, and legs, with exception of decreased soft touch sensation in a C6 dermatome pattern on the right arm.  Coordination: The patient has good finger-nose-finger and heel-to-shin bilaterally.  Gait and station: The patient has a normal gait. Tandem gait is normal. Romberg is negative. No drift is seen.  Reflexes: Deep tendon reflexes are symmetric.   Assessment/Plan:  1.  Probable C6 radiculopathy  2.  Right carpal tunnel syndrome  The pattern of numbness and tingling the patient demonstrates is in a C6 nerve root distribution, the patient has known C6 nerve root impingement by EMG study done in September 2018.  The patient will be placed on a 12-day prednisone taper, if the discomfort persists, he is to contact our office.  We may consider repeat EMG nerve conduction study.  If the pain improved on the prednisone but comes back after cessation of the medication, we may consider an epidural steroid injection.  Otherwise, the patient will follow-up in 6 months.  Jill Alexanders MD 11/14/2017 7:47 AM  [facility] [address] [place], [place] [address]  Phone [phone] Fax [phone]

## 2017-11-15 DIAGNOSIS — I639 Cerebral infarction, unspecified: Secondary | ICD-10-CM | POA: Diagnosis not present

## 2017-11-15 DIAGNOSIS — I2510 Atherosclerotic heart disease of native coronary artery without angina pectoris: Secondary | ICD-10-CM | POA: Diagnosis not present

## 2017-11-15 DIAGNOSIS — H2511 Age-related nuclear cataract, right eye: Secondary | ICD-10-CM | POA: Diagnosis not present

## 2017-11-15 DIAGNOSIS — H538 Other visual disturbances: Secondary | ICD-10-CM | POA: Diagnosis not present

## 2017-11-21 ENCOUNTER — Telehealth: Payer: Self-pay | Admitting: Neurology

## 2017-11-21 NOTE — Telephone Encounter (Signed)
The patient was seen by Dr. Gevena Cotton from ophthalmology.  Overall, the ophthalmologic evaluation was unremarkable.  The patient was being evaluated for some blurring of vision.

## 2017-11-27 ENCOUNTER — Telehealth: Payer: Self-pay | Admitting: Neurology

## 2017-11-27 DIAGNOSIS — M5412 Radiculopathy, cervical region: Secondary | ICD-10-CM

## 2017-11-27 NOTE — Telephone Encounter (Signed)
Pt called stating that 2 days ago he finished his steroids but since the pain throughout his hand has become numb and his forearm is in terrible pain. Please call to discuss

## 2017-11-27 NOTE — Telephone Encounter (Signed)
Called and spoke to patient and he is requesting to go to [place] but does not want to got to [facility] location.  Patient is going to call me back with details if he want's to go to another location.  I relayed to patient that [facility] location was the location that did epidural steroid injection .

## 2017-11-27 NOTE — Telephone Encounter (Signed)
I called the patient.  The patient had some benefit with the prednisone but as soon as the prednisone was stopped the pain is returned.  I would try to get an epidural steroid injection, the patient does not wish to go back to [facility] imaging at the same office where the infusion was done.

## 2017-11-28 DIAGNOSIS — N400 Enlarged prostate without lower urinary tract symptoms: Secondary | ICD-10-CM | POA: Diagnosis not present

## 2017-11-28 DIAGNOSIS — I639 Cerebral infarction, unspecified: Secondary | ICD-10-CM | POA: Diagnosis not present

## 2017-11-28 DIAGNOSIS — N529 Male erectile dysfunction, unspecified: Secondary | ICD-10-CM | POA: Diagnosis not present

## 2017-11-28 DIAGNOSIS — E782 Mixed hyperlipidemia: Secondary | ICD-10-CM | POA: Diagnosis not present

## 2018-01-06 ENCOUNTER — Telehealth: Payer: Self-pay | Admitting: Neurology

## 2018-01-06 NOTE — Telephone Encounter (Signed)
I called the patient.  His initial seizures were in a hyper acute.  Following a frontal stroke event, he is okay to initiate a taper off of Keppra, going down by 1 tablet every 3 weeks until off the medication.  He will call me if there are any new issues.

## 2018-01-06 NOTE — Telephone Encounter (Signed)
Okay to work in this patient if we have a slot this week.

## 2018-01-06 NOTE — Telephone Encounter (Signed)
Patient called regarding his appointment scheduled for tomorrow with Dr. [name]. I advised appointment was probably cancelled because he saw Dr. [name] sooner on 11-14-17 and was to follow up in 6 months. He said tomorrow's appointment was not to be cancelled and he needs to see Dr. [name]. I advised will send message to Dr. [name]' nurse and she will call and schedule.

## 2018-01-06 NOTE — Telephone Encounter (Signed)
I called patient. Offered appt times of 1000am, 230pm, or 400pm on 01/08/18. He declined stating he already has appt that day at [facility]. I offered appt on 01/17/18 at 12pm. He also declined. He would like to hold off on appt and would like a call back to discuss how he can tapper of keppra. He states Dr. [name] previously went over this but he cannot remember the directions.

## 2018-01-07 ENCOUNTER — Other Ambulatory Visit: Payer: Self-pay | Admitting: Neurology

## 2018-01-07 ENCOUNTER — Ambulatory Visit: Payer: Medicare HMO | Admitting: Neurology

## 2018-01-07 MED ORDER — LEVETIRACETAM 750 MG PO TABS: ORAL_TABLET | ORAL | 2 refills | Status: DC

## 2018-01-07 NOTE — Addendum Note (Signed)
Addended by: Kathrynn Ducking on: 01/07/2018 05:08 PM   Modules accepted: Orders

## 2018-01-07 NOTE — Telephone Encounter (Signed)
Erik Salazar [phone] called to advise the pt is out of keppra, will need refill for taper. Please call to advise

## 2018-01-07 NOTE — Telephone Encounter (Signed)
A prescription for the Keppra was sent in. 

## 2018-01-16 NOTE — Telephone Encounter (Signed)
I called pt back. He stated he was taking Keppra 750mg  tablet( two in the morning and two at night). He believes taper instructions are incorrect. Dr. [name] verbally told him something different on how to taper off medication. He would like some clarification. Advised I will send MD a message for clarification.

## 2018-01-16 NOTE — Telephone Encounter (Signed)
I called the patient.  Basically he is to take 3 tablets a day for 3 weeks, 2 tablets a day for 3 weeks, and then 1 tablet daily for 3 weeks and then stop the drug.  I discussed that with him.

## 2018-01-16 NOTE — Telephone Encounter (Signed)
Pt requesting a call back stating that when he picked up his medication for Keppra the directions were to take 3 times daily for 3 weeks then twice  for 3 weeks then once for 3 weeks. Pt stating he never took 3 tablets daily originally so is unclear why he should go up on the dosing.

## 2018-01-21 DIAGNOSIS — N401 Enlarged prostate with lower urinary tract symptoms: Secondary | ICD-10-CM | POA: Diagnosis not present

## 2018-01-21 DIAGNOSIS — N5201 Erectile dysfunction due to arterial insufficiency: Secondary | ICD-10-CM | POA: Diagnosis not present

## 2018-01-21 DIAGNOSIS — R351 Nocturia: Secondary | ICD-10-CM | POA: Diagnosis not present

## 2018-02-27 DIAGNOSIS — Z9889 Other specified postprocedural states: Secondary | ICD-10-CM | POA: Diagnosis not present

## 2018-02-27 DIAGNOSIS — I639 Cerebral infarction, unspecified: Secondary | ICD-10-CM | POA: Diagnosis not present

## 2018-02-27 DIAGNOSIS — Z792 Long term (current) use of antibiotics: Secondary | ICD-10-CM | POA: Diagnosis not present

## 2018-02-27 DIAGNOSIS — Z951 Presence of aortocoronary bypass graft: Secondary | ICD-10-CM | POA: Diagnosis not present

## 2018-02-27 DIAGNOSIS — E782 Mixed hyperlipidemia: Secondary | ICD-10-CM | POA: Diagnosis not present

## 2018-02-27 DIAGNOSIS — I712 Thoracic aortic aneurysm, without rupture: Secondary | ICD-10-CM | POA: Diagnosis not present

## 2018-03-10 ENCOUNTER — Telehealth: Payer: Self-pay | Admitting: Neurology

## 2018-03-10 MED ORDER — LEVETIRACETAM 750 MG PO TABS: 750.0000 mg | ORAL_TABLET | Freq: Two times a day (BID) | ORAL | 2 refills | Status: DC

## 2018-03-10 NOTE — Telephone Encounter (Signed)
Called and LVM for pt relaying Dr. Bernestine Amass recommendations. Advised I will forward message to Dr. [name] to advise on next steps.

## 2018-03-10 NOTE — Addendum Note (Signed)
Addended by: Kathrynn Ducking on: 03/10/2018 04:16 PM   Modules accepted: Orders

## 2018-03-10 NOTE — Telephone Encounter (Signed)
Pt called stating he stopped levETIRAcetam (KEPPRA) 750 MG tablet last Wednesday 03/05/18 and stating his left hand began shaking. Pt stating he took once tablet yesterday and one tablet today and his hand has stopped shaking. Pt requesting a call to know if this is ok to continue doing.

## 2018-03-10 NOTE — Telephone Encounter (Signed)
I called the patient.  The patient had some jerking of the left hand with a Keppra taper.  He is to start the Keppra 750 mg twice daily, he will call if he is still having issues.

## 2018-03-10 NOTE — Telephone Encounter (Signed)
Dr. Leta Baptist- please advise. This is a Dr. [name] pt. You are the WID this am, thank you

## 2018-03-10 NOTE — Telephone Encounter (Signed)
Restart levetiracetam 750mg  twice a day and discuss with Dr. [name]. -VRP

## 2018-03-10 NOTE — Telephone Encounter (Signed)
Pt returning RNs call, advised there was a detailed messaged left also advised of Dr. Gladstone Lighter recommendations.

## 2018-04-08 ENCOUNTER — Encounter: Payer: Self-pay | Admitting: Cardiology

## 2018-05-01 ENCOUNTER — Encounter: Payer: Self-pay | Admitting: Cardiology

## 2018-05-01 ENCOUNTER — Ambulatory Visit (INDEPENDENT_AMBULATORY_CARE_PROVIDER_SITE_OTHER): Payer: Medicare HMO | Admitting: Cardiology

## 2018-05-01 VITALS — BP 126/80 | HR 88 | Ht 70.0 in | Wt 193.6 lb

## 2018-05-01 DIAGNOSIS — I2583 Coronary atherosclerosis due to lipid rich plaque: Secondary | ICD-10-CM | POA: Diagnosis not present

## 2018-05-01 DIAGNOSIS — I729 Aneurysm of unspecified site: Secondary | ICD-10-CM | POA: Diagnosis not present

## 2018-05-01 DIAGNOSIS — Z952 Presence of prosthetic heart valve: Secondary | ICD-10-CM

## 2018-05-01 DIAGNOSIS — I2510 Atherosclerotic heart disease of native coronary artery without angina pectoris: Secondary | ICD-10-CM

## 2018-05-01 DIAGNOSIS — K439 Ventral hernia without obstruction or gangrene: Secondary | ICD-10-CM | POA: Diagnosis not present

## 2018-05-01 MED ORDER — ASPIRIN 81 MG PO TABS: 81.0000 mg | ORAL_TABLET | Freq: Every day | ORAL | Status: DC

## 2018-05-01 NOTE — Patient Instructions (Signed)
Medication Instructions:  Please decrease Aspirin to 81 mg a day. Continue all other medications as listed.  You have been referred back to Dr Prescott Gum.  Appointment Friday 05/02/2018 at 3 pm.  Follow-Up: Follow up in 6 months with Dr. Marlou Porch.  You will receive a letter in the mail 2 months before you are due.  Please call us when you receive this letter to schedule your follow up appointment.  If you need a refill on your cardiac medications before your next appointment, please call your pharmacy.  Thank you for choosing [facility]!!

## 2018-05-01 NOTE — Progress Notes (Signed)
Cardiology Office Note:    Date:  05/01/2018   ID:  Erik Salazar, DOB 07-17-1951, MRN [id number]  PCP:  [name]  Cardiologist:  No primary care provider on file.   Referring MD: [name]     History of Present Illness:    Erik Salazar is a 67 y.o. 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 After moving here, a follow up CTA bu Dr. 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  It measures over 6 cm in length.  Patient asked for a second opinion at [facility].  The patient subsequently underwent redo sternotomy in the summer 2018 at [facility].  A 9 cm abscess collection along the graft was found.  There is no false aneurysm.  Gram stain of this area was negative.  The abscess was debrided and the space was filled with omental flap performed at the same time by plastic surgery.  Patient also had a repeat coronary graft using left IMA to LAD for a stenotic vein graft from the original operation.  He had a postoperative CVA with expressive aphasia and some swallowing difficulty.  He is being followed here by Dr. [name].  He has undergone physical therapy and speech therapy.  He has improved and now has a fairly good functional status. He has declined follow-up at [facility] and wishes to be followed [facility] for his aortic cardiac surgery.  Follow up CT shows resolution of abscess (had dye extravasation). ECHO with normal AV function. Plan is to have one year CTA by Dr. [name].   Dr. [name] has given him a disability placard. He indicated that he has trouble locating his car secondary to memory problems. He said he had no problem driving. He was instructed to taper off Keppra.  10/28/17-sinus rhythm 80 with occasional bigeminy noted on his EKG. he is doing well exercising 6 days a week, no syncope, no orthopnea, no chest pain.  He did still state that he has some  numbness in his first 3 finger distribution.  Wearing a brace.  05/01/18 -today he is concerned about 2 separate issues.  First is his ventral hernia in the epigastric region surrounding his redo sternotomy scar is bothering him and he would like this looked at again.  Secondly, he notes that he has been having some coughing at night when laying down and some mild discomfort between his scapula reminding him somewhat of how he felt prior to discovery of sterile abscess surrounding his prior aortic repair.  Unlike the prior episode, he is still able to exercise very well, he walks 3 miles a day on the treadmill for 47 minutes.  He could not do that previously.  He denies any GERD-like symptoms, no fevers, no seasonal allergies/postnasal drip.  He has gained 13 pounds but he states that this is from muscle mass since he has been working out more at the gym.  No fevers.    Past Medical History:  Diagnosis Date  . Carpal tunnel syndrome on right 07/08/2017  . Cervical radiculopathy at C6 11/14/2017   Right  . Enlarged prostate   . H/O: knee surgery    15   . History of open heart surgery    ASCENDING AORTIC ANEURYSM  . Ischemic stroke of frontal lobe ([place]) 04/05/2017   Bilateral  . Seizure disorder ([place]) 04/05/2017  . Seizures ([place])   .  Status post knee surgery    DVT POST KNEE SURGERY    Past Surgical History:  Procedure Laterality Date  . CARDIAC SURGERY     ANUERSYM MAY 2017   Bentall procedure. 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 CORONARY ARTERY BYPASS GRAFT  03/13/2017   LIMA to LAD with steril abcess removal from dacron graft  . FALSE ANEURYSM REPAIR  03/13/2017   at [facility]   . KNEE SURGERY  1983  . open heart surgery     03-13-2017  . REPLACEMENT TOTAL KNEE  2015  . ROTATOR CUFF REPAIR      Current Medications: Current Meds    Medication Sig  . aspirin 81 MG tablet Take 1 tablet (81 mg total) by mouth daily.  Marland Kitchen levETIRAcetam (KEPPRA) 750 MG tablet Take 1 tablet (750 mg total) by mouth 2 (two) times daily.  . metoprolol succinate (TOPROL-XL) 25 MG 24 hr tablet Take 0.5 tablets (12.5 mg total) by mouth daily.  . pravastatin (PRAVACHOL) 40 MG tablet Take 1 tablet (40 mg total) by mouth every evening.  . tamsulosin (FLOMAX) 0.4 MG CAPS capsule Take 0.4 mg by mouth every evening.   . [DISCONTINUED] aspirin 325 MG tablet Take 325 mg by mouth daily.     Allergies:   Oxycodone hcl; Oxycontin [oxycodone]; Percocet [oxycodone-acetaminophen]; and Sulfur   Social History   Socioeconomic History  . Marital status: Married    Spouse name: Not on file  . Number of children: Not on file  . Years of education: Not on file  . Highest education level: Not on file  Occupational History  . Occupation: RETIRED  Social Needs  . Financial resource strain: Not on file  . Food insecurity:    Worry: Not on file    Inability: Not on file  . Transportation needs:    Medical: Not on file    Non-medical: Not on file  Tobacco Use  . Smoking status: Never Salazar  . Smokeless tobacco: Never Used  Substance and Sexual Activity  . Alcohol use: No  . Drug use: No  . Sexual activity: Yes  Lifestyle  . Physical activity:    Days per week: Not on file    Minutes per session: Not on file  . Stress: Not on file  Relationships  . Social connections:    Talks on phone: Not on file    Gets together: Not on file    Attends religious service: Not on file    Active member of club or organization: Not on file    Attends meetings of clubs or organizations: Not on file    Relationship status: Not on file  Other Topics Concern  . Not on file  Social History Narrative   Lives at home with wife, is retired.  Education: 2 yrs college.   2 Children.     Family History: The patient's family history includes Aneurysm in his  mother.  ROS:   Please see the history of present illness.       EKGs/Labs/Other Studies Reviewed:    The following studies were reviewed today:  ECHO: 09/24/17 - Left ventricle: The cavity size was normal. There was mild focal   basal hypertrophy of the septum. Systolic function was normal.   The estimated ejection fraction was in the  range of 60% to 65%.   Wall motion was normal; there were no regional wall motion   abnormalities. Doppler parameters are equivocal for elevated mean   left atrial filling pressure. - Ventricular septum: Septal motion showed paradox. These changes   are consistent with a post-thoracotomy state. - Aortic valve: A biopros prosthesis was present and functioning   normally. - Mitral valve: Calcified annulus. Mildly thickened leaflets .   Systolic bowing without prolapse. - Left atrium: The atrium was mildly dilated. - Pulmonary arteries: Systolic pressure was mildly increased. PA   peak pressure: 34 mm Hg (S).  EKG:  EKG is ordered today.  The ekg ordered today demonstrates sinus rhythm, frequent PVCs, bigeminy, right bundle branch block, 88  Recent Labs: No results found for requested labs within last 8760 hours.  Recent Lipid Panel    Component Value Date/Time   CHOL 199 12/13/2016 0942   TRIG 166 (H) 12/13/2016 0942   HDL 45 12/13/2016 0942   CHOLHDL 4.4 12/13/2016 0942   LDLCALC 121 (H) 12/13/2016 0942    Physical Exam:    VS:  BP 126/80   Pulse 88   Ht 5\' 10"  (1.778 m)   Wt 193 lb 9.6 oz (87.8 kg)   SpO2 94%   BMI 27.78 kg/m     Wt Readings from Last 3 Encounters:  05/01/18 193 lb 9.6 oz (87.8 kg)  11/14/17 183 lb (83 kg)  10/28/17 180 lb 1.9 oz (81.7 kg)    GEN: Well nourished, well developed, in no acute distress  HEENT: normal  Neck: no JVD, carotid bruits, or masses Cardiac: RRR; soft systolic murmur, no rubs, or gallops,no edema  Respiratory:  clear to auscultation bilaterally, normal work of breathing GI: soft,  nontender, nondistended, + BS MS: no deformity or atrophy  Skin: warm and dry, no rash, right wrist brace.  Neuro:  Alert and Oriented x 3, Strength and sensation are intact, mild numbness 1-3 right fingers.  Psych: euthymic mood, full affect   ASSESSMENT:    1. S/P AVR   2. Coronary artery disease due to lipid rich plaque   3. 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 Ventral hernia without obstruction or gangrene    PLAN:    In order of problems listed above:  Ascending aortic aneurysm repair status post Bentall procedure and bioprosthetic aortic valve replacement with redo sternotomy secondary to a false aneurysm noted at the distal suture line.   - Dental prophylaxis  - Aspirin 81,-bioprosthetic aortic valve and CABG and prior stroke.  -He is exercising very well at the gym without any difficulties, 47 minutes on the treadmill for instance however when he is laying down at night he is feeling some similar symptoms that he felt with his prior "abscess "he states.  No fevers.  No tearing sensation.  He was requesting another visit with Dr. [name] to discuss.  He also is having some difficulty with his ventral hernia in his epigastric region surrounding his sternotomy scar.  I will expedite an appointment with Dr. [name] to have him evaluate.  He was due to get a CT scan in February of next year, however Dr. [name] may wish to obtain this sooner given some of the symptoms.  His blood pressures/pulses are equal in both arms.  He appears well.  Avoid Cipro.  Recent EF in February was normal.  He is not demonstrating any other signs of heart failure.  Does not appear to be fluid overloaded.  He  has gained 13 pounds but he states that much of this is from muscle mass.  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  If all looks normal, one may consider a trial of low-dose Lasix 20 mg a day to see if this helps reduce some of his symptoms.  My first step  however  is to have him see Dr. [name] on Friday.    CAD  - LIMA to LAD on 03/13/17/redo sternotomy with sterile abscess removal, Dacron graft  - SVG from aorta to LAD and SVG to circumflex previously on 01/04/16 with SVG to mid LAD 90% stenosis and subsequent LIMA to LAD placed. SVG to OM 2 was patent.  -No anginal symptoms  Premature ventricular contractions -Quadrigeminy and bigeminy previously seen on ECG. On low dose toprol 12.5 QD.  He is asymptomatic with these.  No changes.  Hyperlipidemia  - Continue with pravastatin.  No changes made.  History of CVA  - post op. Dr. [name].     Medication Adjustments/Labs and Tests Ordered: Current medicines are reviewed at length with the patient today.  Concerns regarding medicines are outlined above.  No orders of the defined types were placed in this encounter.  Meds ordered this encounter  Medications  . aspirin 81 MG tablet    Sig: Take 1 tablet (81 mg total) by mouth daily.    Signed, Candee Furbish, MD  05/01/2018 9:57 AM    [facility]

## 2018-05-02 ENCOUNTER — Other Ambulatory Visit: Payer: Self-pay | Admitting: *Deleted

## 2018-05-02 ENCOUNTER — Ambulatory Visit: Payer: Medicare HMO

## 2018-05-02 ENCOUNTER — Other Ambulatory Visit: Payer: Self-pay

## 2018-05-02 ENCOUNTER — Telehealth: Payer: Self-pay | Admitting: Cardiology

## 2018-05-02 DIAGNOSIS — I7121 Aneurysm of the ascending aorta, without rupture: Secondary | ICD-10-CM

## 2018-05-02 DIAGNOSIS — Z8679 Personal history of other diseases of the circulatory system: Secondary | ICD-10-CM

## 2018-05-02 DIAGNOSIS — I712 Thoracic aortic aneurysm, without rupture, unspecified: Secondary | ICD-10-CM

## 2018-05-02 DIAGNOSIS — Z9889 Other specified postprocedural states: Principal | ICD-10-CM

## 2018-05-02 NOTE — Telephone Encounter (Signed)
appt was scheduled by Erik Salazar.  Cancellation comments state provider in surgery all day.

## 2018-05-02 NOTE — Telephone Encounter (Signed)
Pt called back in this afternoon upset as he was recently called by [facility] and given an appt for his CTA on 9/20 and a f/u with Dr Prescott Gum that is not scheduled until October.  He feels this is not soon enough.  Advised I will contact Dr Marlou Porch to review appropriateness.  Per Dr Marlou Porch - he feels the CT-A timing is reasonable given patient's preferences.  Advised patient if the result is abnormal [facility] will move appt up based on those results and Dr Lucianne Lei Trigt's recommendations/orders.  Pt was also advised per Dr Marlou Porch it is always a good idea to make the original surgical team at [facility] aware of his concerns and s/s.  Pt states he will not do this because he never gets to see or speak to the surgeon, it's always someone else down the line from him.  He will call back with further concerns.

## 2018-05-02 NOTE — Telephone Encounter (Signed)
New Message   Patient is calling because an appointment was made for him at [facility] and cancelled. He wanting to know who the nurse spoke with to schedule and let Dr. Marlou Porch know that it has been cancelled. Please call to discuss.

## 2018-05-02 NOTE — Telephone Encounter (Signed)
° °  Patient calling back, patient informed that Dr Nils Pyle is not available today. Spoke with Manuela Schwartz at Dr Lucianne Lei Tright's office; she will contact patient today to arrange office visit and  CT scan. Patient informed

## 2018-05-05 ENCOUNTER — Encounter (HOSPITAL_COMMUNITY): Payer: Self-pay | Admitting: Emergency Medicine

## 2018-05-05 ENCOUNTER — Emergency Department (HOSPITAL_COMMUNITY)
Admission: EM | Admit: 2018-05-05 | Discharge: 2018-05-05 | Disposition: A | Discharge status: Home / Self Care | Payer: Medicare HMO | Attending: Emergency Medicine | Admitting: Emergency Medicine

## 2018-05-05 ENCOUNTER — Emergency Department (HOSPITAL_COMMUNITY): Payer: Medicare HMO

## 2018-05-05 ENCOUNTER — Other Ambulatory Visit: Payer: Self-pay

## 2018-05-05 DIAGNOSIS — R0602 Shortness of breath: Secondary | ICD-10-CM | POA: Diagnosis not present

## 2018-05-05 DIAGNOSIS — Z7982 Long term (current) use of aspirin: Secondary | ICD-10-CM | POA: Diagnosis not present

## 2018-05-05 DIAGNOSIS — R0789 Other chest pain: Secondary | ICD-10-CM | POA: Insufficient documentation

## 2018-05-05 DIAGNOSIS — Z79899 Other long term (current) drug therapy: Secondary | ICD-10-CM | POA: Insufficient documentation

## 2018-05-05 DIAGNOSIS — R9431 Abnormal electrocardiogram [ECG] [EKG]: Secondary | ICD-10-CM | POA: Diagnosis not present

## 2018-05-05 DIAGNOSIS — I712 Thoracic aortic aneurysm, without rupture: Secondary | ICD-10-CM | POA: Diagnosis not present

## 2018-05-05 LAB — BASIC METABOLIC PANEL
Anion gap: 12 (ref 5–15)
BUN: 20 mg/dL (ref 8–23)
CALCIUM: 9.7 mg/dL (ref 8.9–10.3)
CO2: 25 mmol/L (ref 22–32)
Chloride: 98 mmol/L (ref 98–111)
Creatinine, Ser: 1.08 mg/dL (ref 0.61–1.24)
GFR calc non Af Amer: >60 mL/min (ref >60)
Glucose, Bld: 109 mg/dL — ABNORMAL HIGH (ref 70–99)
Potassium: 4.4 mmol/L (ref 3.5–5.1)
SODIUM: 135 mmol/L (ref 135–145)

## 2018-05-05 LAB — I-STAT TROPONIN, ED: TROPONIN I, POC: 0 ng/mL (ref 0.00–0.08)

## 2018-05-05 LAB — CBC
HCT: 49.2 % (ref 39.0–52.0)
Hemoglobin: 15.8 g/dL (ref 13.0–17.0)
MCH: 27.5 pg (ref 26.0–34.0)
MCHC: 32.1 g/dL (ref 30.0–36.0)
MCV: 85.6 fL (ref 78.0–100.0)
PLATELETS: 275 10*3/uL (ref 150–400)
RBC: 5.75 MIL/uL (ref 4.22–5.81)
RDW: 13.2 % (ref 11.5–15.5)
WBC: 10.1 10*3/uL (ref 4.0–10.5)

## 2018-05-05 LAB — BRAIN NATRIURETIC PEPTIDE: B NATRIURETIC PEPTIDE 5: 46.5 pg/mL (ref 0.0–100.0)

## 2018-05-05 MED ORDER — IOPAMIDOL (ISOVUE-370) INJECTION 76%
100.0000 mL | Freq: Once | INTRAVENOUS | Status: AC | PRN
  Administered 2018-05-05: 100 mL via INTRAVENOUS

## 2018-05-05 MED ORDER — IOPAMIDOL (ISOVUE-370) INJECTION 76%
INTRAVENOUS | Status: AC
  Filled 2018-05-05: qty 100

## 2018-05-05 NOTE — ED Triage Notes (Signed)
Pt arrives via POV with chest pain and shortness of breath intermittently for a while. PCP wants CT scan but keeps reschedule. Pt reports hx of open heart Surgery.

## 2018-05-05 NOTE — Discharge Instructions (Addendum)
You were evaluated in the emergency department for intermittent chest pain.  You had an EKG blood work and a CT chest that did not show an obvious cause of your symptoms.  Will be important for you to follow-up with your cardiologist and your cardiothoracic surgeon.  Please continue your regular medicines and return if any worsening symptoms.

## 2018-05-05 NOTE — ED Provider Notes (Signed)
[facility] EMERGENCY DEPARTMENT Provider Note   CSN: [id number] Arrival date & time: 05/05/18  3244     History   Chief Complaint Chief Complaint  Patient presents with  . Shortness of Breath  . Chest Pain    HPI Erik Salazar is a 67 y.o. male.  He has a complex cardiac history with aortic valve replacement CABG and aortic dissection status post repair in [place].  He had some element of a redo at [facility] complicated by a sternal infection.  He follows with Dr. Marlou Porch from cardiology here along with Dr. [name] from cardiothoracic.  He was in his cardiologist office few days ago complaining of this intermittent chest pain and shortness of breath.  They were trying to set him up with a repeat appointment with cardiothoracic along with a CAT scan but he does not sound like this could be accomplished quickly and so they were referred here for imaging.  He says his pain is sharp and intermittent on the left side and brief along with shortness of breath that is been more constant.  He is having trouble laying down flat.  No fevers no chills.  Dry cough.  No nausea no vomiting no diarrhea no urine symptoms no swollen joints or rashes no peripheral edema.  The history is provided by the patient.  Shortness of Breath  This is a new problem. The average episode lasts 1 week. The current episode started more than 1 week ago. The problem has not changed since onset.Associated symptoms include cough, PND, orthopnea and chest pain. Pertinent negatives include no fever, no rhinorrhea, no sore throat, no neck pain, no sputum production, no hemoptysis, no wheezing, no syncope, no vomiting, no abdominal pain, no rash, no leg pain and no leg swelling. It is unknown what precipitated the problem. He has tried nothing for the symptoms. Associated medical issues include CAD.  Chest Pain   This is a new problem. The problem occurs daily. The problem has not changed since onset.The pain is  present in the substernal region. The pain is moderate. The quality of the pain is described as brief, sharp and stabbing. The pain does not radiate. Associated symptoms include cough, orthopnea, PND and shortness of breath. Pertinent negatives include no abdominal pain, no fever, no hemoptysis, no leg pain, no sputum production, no syncope and no vomiting. He has tried nothing for the symptoms.  His past medical history is significant for aortic aneurysm and CAD.    Past Medical History:  Diagnosis Date  . Carpal tunnel syndrome on right 07/08/2017  . Cervical radiculopathy at C6 11/14/2017   Right  . Enlarged prostate   . H/O: knee surgery    15   . History of open heart surgery    ASCENDING AORTIC ANEURYSM  . Ischemic stroke of frontal lobe ([place]) 04/05/2017   Bilateral  . Seizure disorder ([place]) 04/05/2017  . Seizures ([place])   . Status post knee surgery    DVT POST KNEE SURGERY    Patient Active Problem List   Diagnosis Date Noted  . Cervical radiculopathy at C6 11/14/2017  . Carpal tunnel syndrome on right 07/08/2017  . Tremor, essential 07/08/2017  . Ischemic stroke of frontal lobe ([place]) 04/05/2017  . Seizure disorder ([place]) 04/05/2017  . Right arm pain 04/05/2017    Past Surgical History:  Procedure Laterality Date  . CARDIAC SURGERY     ANUERSYM MAY 2017   Bentall procedure. Bioprosthetic aortic valve #23 mm bovine  model #2700 TF ask, and 28 mm Gelweave woven vascular sinus of Valsalva graft  . 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 CORONARY ARTERY BYPASS GRAFT  03/13/2017   LIMA to LAD with steril abcess removal from dacron graft  . FALSE ANEURYSM REPAIR  03/13/2017   at [facility]   . KNEE SURGERY  1983  . open heart surgery     03-13-2017  . REPLACEMENT TOTAL KNEE  2015  . ROTATOR CUFF REPAIR          Home Medications    Prior to Admission medications   Medication Sig Start Date End Date Taking? Authorizing Provider  aspirin 81 MG tablet  Take 1 tablet (81 mg total) by mouth daily. 05/01/18   Jerline Pain, MD  levETIRAcetam (KEPPRA) 750 MG tablet Take 1 tablet (750 mg total) by mouth 2 (two) times daily. 03/10/18   Kathrynn Ducking, MD  metoprolol succinate (TOPROL-XL) 25 MG 24 hr tablet Take 0.5 tablets (12.5 mg total) by mouth daily. 11/11/17   Jerline Pain, MD  pravastatin (PRAVACHOL) 40 MG tablet Take 1 tablet (40 mg total) by mouth every evening. 05/29/17 05/29/18  Jerline Pain, MD  tamsulosin (FLOMAX) 0.4 MG CAPS capsule Take 0.4 mg by mouth every evening.     [provider]    Family History Family History  Problem Relation Age of Onset  . Aneurysm Mother        brain aneurysm for mother.     Social History Social History   Tobacco Use  . Smoking status: Never Smoker  . Smokeless tobacco: Never Used  Substance Use Topics  . Alcohol use: No  . Drug use: No     Allergies   Oxycodone hcl; Oxycontin [oxycodone]; Percocet [oxycodone-acetaminophen]; and Sulfur   Review of Systems Review of Systems  Constitutional: Negative for fever.  HENT: Negative for rhinorrhea and sore throat.   Eyes: Negative for visual disturbance.  Respiratory: Positive for cough and shortness of breath. Negative for hemoptysis, sputum production and wheezing.   Cardiovascular: Positive for chest pain, orthopnea and PND. Negative for leg swelling and syncope.  Gastrointestinal: Negative for abdominal pain and vomiting.  Genitourinary: Negative for dysuria.  Musculoskeletal: Negative for neck pain.  Skin: Negative for rash.     Physical Exam Updated Vital Signs BP (!) 168/70 (BP Location: Right Arm)   Pulse 88   Temp 97.9 F (36.6 C) (Oral)   Resp 18   Ht 5\' 10"  (1.778 m)   Wt 87 kg   SpO2 98%   BMI 27.52 kg/m   Physical Exam  Constitutional: He appears well-developed and well-nourished.  HENT:  Head: Normocephalic and atraumatic.  Eyes: Conjunctivae are normal.  Neck: Neck supple.  Cardiovascular:  Normal rate and regular rhythm.  No murmur heard. Pulmonary/Chest: Effort normal and breath sounds normal. No respiratory distress.  Abdominal: Soft. He exhibits no mass. There is no tenderness. There is no guarding.  Musculoskeletal:       Right lower leg: Normal. He exhibits no tenderness and no edema.       Left lower leg: Normal. He exhibits no tenderness and no edema.  Neurological: He is alert.  Skin: Skin is warm and dry. Capillary refill takes less than 2 seconds.  Psychiatric: He has a normal mood and affect.  Nursing note and vitals reviewed.    ED Treatments / Results  Labs (all labs ordered  are listed, but only abnormal results are displayed) Labs Reviewed  BASIC METABOLIC PANEL - Abnormal; Notable for the following components:      Result Value   Glucose, Bld 109 (*)    All other components within normal limits  CBC  BRAIN NATRIURETIC PEPTIDE  I-STAT TROPONIN, ED    EKG EKG Interpretation  Date/Time:  Monday May 05 2018 06:57:23 EDT Ventricular Rate:  88 PR Interval:  170 QRS Duration: 140 QT Interval:  392 QTC Calculation: 474 R Axis:   82 Text Interpretation:  Normal sinus rhythm Right bundle branch block Abnormal ECG no prior to compare with Confirmed by Aletta Edouard [phone]) on 05/05/2018 8:04:01 AM   Radiology Dg Chest 2 View  Result Date: 05/05/2018 CLINICAL DATA:  Chest pain and shortness of breath EXAM: CHEST - 2 VIEW COMPARISON:  Chest radiograph March 28, 2017 and chest CT October 16, 2017 FINDINGS: There is scarring in the lung bases, slightly more on the left than on the right. There is no frank edema consolidation. Heart size and pulmonary vascularity are normal. No adenopathy. Patient is status post aortic valve replacement. No evident adenopathy. No bone lesions. There are surgical clips in the right axillary region. IMPRESSION: Bibasilar scarring. No edema or consolidation. Heart size within normal limits. Status post aortic valve  replacement. Electronically Signed   By: Lowella Grip III M.D.   On: 05/05/2018 07:43    Procedures Procedures (including critical care time)  Medications Ordered in ED Medications - No data to display   Initial Impression / Assessment and Plan / ED Course  I have reviewed the triage vital signs and the nursing notes.  Pertinent labs & imaging results that were available during my care of the patient were reviewed by me and considered in my medical decision making (see chart for details).  Clinical Course as of May 06 808  Mon May 05, 2018  1005 Patient with known aorta repair including AVR complicated by a sternal infection requiring a second surgery.  He is here now with some vague chest discomfort.  Troponin BNP EKG unchanged.  He was quite upset about not getting a chest CT scheduled soon enough with cardiothoracic/cardiology and so we were able to get an IV in him and arrange for him to get a CT angios chest.  Awaiting results.   [MB]  2703 Reviewed the results of the CT with the patient and he is comfortable following up outpatient with his CT surgeon.   [MB]    Clinical Course User Index [MB] Hayden Rasmussen, MD     Final Clinical Impressions(s) / ED Diagnoses   Final diagnoses:  Atypical chest pain    ED Discharge Orders    None       Hayden Rasmussen, MD 05/06/18 [phone]

## 2018-05-05 NOTE — ED Notes (Signed)
Patient returned from CT

## 2018-05-05 NOTE — ED Notes (Signed)
Patient transported to CT 

## 2018-05-08 DIAGNOSIS — D1801 Hemangioma of skin and subcutaneous tissue: Secondary | ICD-10-CM | POA: Diagnosis not present

## 2018-05-08 DIAGNOSIS — L570 Actinic keratosis: Secondary | ICD-10-CM | POA: Diagnosis not present

## 2018-05-08 DIAGNOSIS — L821 Other seborrheic keratosis: Secondary | ICD-10-CM | POA: Diagnosis not present

## 2018-05-08 DIAGNOSIS — D225 Melanocytic nevi of trunk: Secondary | ICD-10-CM | POA: Diagnosis not present

## 2018-05-08 DIAGNOSIS — Z85828 Personal history of other malignant neoplasm of skin: Secondary | ICD-10-CM | POA: Diagnosis not present

## 2018-05-08 DIAGNOSIS — L814 Other melanin hyperpigmentation: Secondary | ICD-10-CM | POA: Diagnosis not present

## 2018-05-09 ENCOUNTER — Other Ambulatory Visit: Payer: Medicare HMO

## 2018-05-14 ENCOUNTER — Ambulatory Visit: Payer: Medicare HMO | Admitting: Cardiothoracic Surgery

## 2018-05-14 ENCOUNTER — Ambulatory Visit (INDEPENDENT_AMBULATORY_CARE_PROVIDER_SITE_OTHER): Payer: Medicare HMO | Admitting: Cardiothoracic Surgery

## 2018-05-14 ENCOUNTER — Encounter: Payer: Self-pay | Admitting: Cardiothoracic Surgery

## 2018-05-14 ENCOUNTER — Other Ambulatory Visit: Payer: Self-pay

## 2018-05-14 VITALS — BP 140/80 | HR 95 | Resp 18 | Ht 70.0 in | Wt 194.2 lb

## 2018-05-14 DIAGNOSIS — Z9889 Other specified postprocedural states: Secondary | ICD-10-CM | POA: Diagnosis not present

## 2018-05-14 DIAGNOSIS — Z8679 Personal history of other diseases of the circulatory system: Secondary | ICD-10-CM | POA: Diagnosis not present

## 2018-05-14 NOTE — Progress Notes (Signed)
PCP is Pa, [place] Referring Provider is Jerline Pain, MD  Chief Complaint  Patient presents with  . Follow-up    f/u with CTA chest 05/05/18  . Thoracic Aortic Aneurysm    s/p Bentall    HPI: Patient returns for follow-up.  He has a complicated history of undergoing a biologic Bentall in 2017 with a 23 mm bioprosthetic AVR in 28 mm Valsalva graft.  Patient had difficulty separating from cardiac point bypass and 2 vein grafts were harvested and anastomosed to the LAD and circumflex.  2018 a routine CTA of his aorta showed a possible pseudoaneurysm at the distal suture line which turned out to be a sterile abscess at the time of reexploration at [facility] by Dr. Ysidro Evert.  The sterile abscess was debrided irrigated and a omental flap was placed in that space through laparotomy.  At that time the vein graft to the LAD was occluded and a LIMA graft was placed to the LAD.  The patient had a stroke after that procedure affecting his speech memory and possibly right upper extremity.  The patient returned to the ED with nonspecific complaints of chest pain and shortness of breath and a CTA was performed earlier this month.  I have reviewed those images personally and discussed those findings with the patient and family.  The aortic repair looks fine.  The omental flap is fine.  A recent echocardiogram has shown normal LV function and there is no evidence of coronary ischemia.  The patient currently has been able to return to a very high functional level and works out at the fitness center almost 2 hours daily.  His main complaint now is right hand weakness, poor grip and numbness.  He is right-hand dominant.  The right hand symptoms have also been related to a extravasated IV injection for CT scan in late February 2019.  He says the weakness and numbness have been significantly worse since that time.  Patient has no symptoms currently of angina or CHF.  All surgical  incisions have healed well but he does have a small to finger breath ventral hernia below the xiphoid.  This does not look impressive on CT scan. Past Medical History:  Diagnosis Date  . Carpal tunnel syndrome on right 07/08/2017  . Cervical radiculopathy at C6 11/14/2017   Right  . Enlarged prostate   . H/O: knee surgery    15   . History of open heart surgery    ASCENDING AORTIC ANEURYSM  . Ischemic stroke of frontal lobe ([place]) 04/05/2017   Bilateral  . Seizure disorder ([place]) 04/05/2017  . Seizures ([place])   . Status post knee surgery    DVT POST KNEE SURGERY    Past Surgical History:  Procedure Laterality Date  . CARDIAC SURGERY     ANUERSYM MAY 2017   Bentall procedure. 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 CORONARY ARTERY BYPASS GRAFT  03/13/2017   LIMA to LAD with steril abcess removal from dacron graft  . FALSE ANEURYSM REPAIR  03/13/2017   at [facility]   . KNEE SURGERY  1983  . open heart surgery     03-13-2017  . REPLACEMENT TOTAL KNEE  2015  . ROTATOR CUFF REPAIR      Family History  Problem Relation  Age of Onset  . Aneurysm Mother        brain aneurysm for mother.     Social History Social History   Tobacco Use  . Smoking status: Never Smoker  . Smokeless tobacco: Never Used  Substance Use Topics  . Alcohol use: No  . Drug use: No    Current Outpatient Medications  Medication Sig Dispense Refill  . aspirin 81 MG tablet Take 1 tablet (81 mg total) by mouth daily.    Marland Kitchen levETIRAcetam (KEPPRA) 750 MG tablet Take 1 tablet (750 mg total) by mouth 2 (two) times daily. 120 tablet 2  . metoprolol succinate (TOPROL-XL) 25 MG 24 hr tablet Take 0.5 tablets (12.5 mg total) by mouth daily. 90 tablet 3  . pravastatin (PRAVACHOL) 40 MG tablet Take 1 tablet (40 mg total) by mouth every evening. 90 tablet 3  . tamsulosin (FLOMAX)  0.4 MG CAPS capsule Take 0.4 mg by mouth every evening.      No current facility-administered medications for this visit.     Allergies  Allergen Reactions  . Oxycodone Hcl     DELUSIONAL  . Oxycontin [Oxycodone]     DELUSIONAL   . Percocet [Oxycodone-Acetaminophen]     DELUSIONAL  . Sulfur Rash    Review of Systems  Patient is careful not to lift any weight more than 10 pounds when he works out.  BP 140/80 (BP Location: Right Arm, Patient Position: Sitting, Cuff Size: Normal)   Pulse 95   Resp 18   Ht 5\' 10"  (1.778 m)   Wt 194 lb 3.2 oz (88.1 kg)   SpO2 95% Comment: RA  BMI 27.86 kg/m  Physical Exam      Exam    General- alert and comfortable    Neck- no JVD, no cervical adenopathy palpable, no carotid bruit   Lungs- clear without rales, wheezes   Cor- regular rate and rhythm, no murmur , gallop   Abdomen- soft, non-tender   Extremities - warm, non-tender, minimal edema   Neuro- oriented, appropriate, moderate to severe focal weakness of right hand grip   Diagnostic Tests: CTA performed a week ago shows excellent repair of the ascending aortic aneurysm, omental flap to treat the sterile abscess, and lungs are clear.  Sternal wires intact.  Impression: Cardiac status stable after 2 major cardiac procedures. Dominant right hand dysfunction is significantly limiting his daily activities.  Plan: We will plan an annual CTA of his thoracic aorta every summer.  He will be referred to Dr. Amedeo Plenty for evaluation of his hand weakness and possible peripheral nerve injury related to the previous surgery and soft tissue extravasation of IV contrast into the right antecubital fossa. Return in 3 months for review.  CTA will need to be scheduled for September 2021  Len Childs, MD [facility] [phone]

## 2018-05-16 DIAGNOSIS — Z23 Encounter for immunization: Secondary | ICD-10-CM | POA: Diagnosis not present

## 2018-05-20 ENCOUNTER — Other Ambulatory Visit: Payer: Self-pay | Admitting: Cardiology

## 2018-05-28 ENCOUNTER — Ambulatory Visit: Payer: Medicare HMO | Admitting: Cardiothoracic Surgery

## 2018-06-12 DIAGNOSIS — J0100 Acute maxillary sinusitis, unspecified: Secondary | ICD-10-CM | POA: Diagnosis not present

## 2018-07-10 DIAGNOSIS — R071 Chest pain on breathing: Secondary | ICD-10-CM | POA: Diagnosis not present

## 2018-07-14 DIAGNOSIS — M13849 Other specified arthritis, unspecified hand: Secondary | ICD-10-CM | POA: Diagnosis not present

## 2018-07-14 DIAGNOSIS — M79641 Pain in right hand: Secondary | ICD-10-CM | POA: Diagnosis not present

## 2018-07-14 DIAGNOSIS — G5601 Carpal tunnel syndrome, right upper limb: Secondary | ICD-10-CM | POA: Diagnosis not present

## 2018-07-28 ENCOUNTER — Telehealth: Payer: Self-pay | Admitting: Neurology

## 2018-07-28 DIAGNOSIS — Z125 Encounter for screening for malignant neoplasm of prostate: Secondary | ICD-10-CM | POA: Diagnosis not present

## 2018-07-28 MED ORDER — LEVETIRACETAM 750 MG PO TABS: 750.0000 mg | ORAL_TABLET | Freq: Two times a day (BID) | ORAL | 0 refills | Status: DC

## 2018-07-28 NOTE — Telephone Encounter (Signed)
Refill for Keppra submitted for a 60 day supply. Patient advised to call our office to schedule f/u l(ast visit was 11/14/2016). MTB RN.

## 2018-07-28 NOTE — Telephone Encounter (Signed)
Pt requesting refills for levETIRAcetam (KEPPRA) 750 MG tablet sent to [facility] stating he is down to his last tablet.

## 2018-07-30 ENCOUNTER — Other Ambulatory Visit: Payer: Self-pay | Admitting: Cardiology

## 2018-08-20 HISTORY — PX: CARPAL TUNNEL RELEASE: SHX101

## 2018-08-21 DIAGNOSIS — M79641 Pain in right hand: Secondary | ICD-10-CM | POA: Diagnosis not present

## 2018-08-21 DIAGNOSIS — G5601 Carpal tunnel syndrome, right upper limb: Secondary | ICD-10-CM | POA: Diagnosis not present

## 2018-08-21 DIAGNOSIS — M19049 Primary osteoarthritis, unspecified hand: Secondary | ICD-10-CM | POA: Insufficient documentation

## 2018-08-21 DIAGNOSIS — M13841 Other specified arthritis, right hand: Secondary | ICD-10-CM | POA: Diagnosis not present

## 2018-09-02 ENCOUNTER — Ambulatory Visit (INDEPENDENT_AMBULATORY_CARE_PROVIDER_SITE_OTHER): Payer: Medicare HMO | Admitting: Cardiothoracic Surgery

## 2018-09-02 ENCOUNTER — Encounter: Payer: Self-pay | Admitting: Cardiothoracic Surgery

## 2018-09-02 ENCOUNTER — Other Ambulatory Visit: Payer: Self-pay

## 2018-09-02 VITALS — BP 142/83 | HR 76 | Resp 18 | Ht 70.0 in | Wt 197.0 lb

## 2018-09-02 DIAGNOSIS — Z9889 Other specified postprocedural states: Secondary | ICD-10-CM

## 2018-09-02 DIAGNOSIS — Z8679 Personal history of other diseases of the circulatory system: Secondary | ICD-10-CM | POA: Diagnosis not present

## 2018-09-02 NOTE — Progress Notes (Signed)
PCP is Pa, [place] Referring Provider is Jerline Pain, MD  Chief Complaint  Patient presents with  . Thoracic Aortic Dissection    f/u after repair done at [facility], return for review    HPI: The patient returns for scheduled follow-up.  He is status post biologic Bentall procedure in 2017 in [place] followed by revision of the graft and placement of left IMA graft to LAD at [facility] in 2018.  He presents for further discussion of severe pain in his right hand and wrist.  This keeps him up at night.  The patient has been carefully evaluated by Dr. Roseanne Kaufman who has recommended an outpatient carpal tunnel procedure for relief of symptoms.  Patient has no cardiac symptoms currently.  His last CTA and echocardiogram show normal cardiac function, no pseudoaneurysm of the graft, and normal function of the biologic AVR.  He is in sinus rhythm.  Blood pressure is controlled.  Patient is medically cleared for the carpal tunnel surgery by Dr. Amedeo Plenty.  Past Medical History:  Diagnosis Date  . Carpal tunnel syndrome on right 07/08/2017  . Cervical radiculopathy at C6 11/14/2017   Right  . Enlarged prostate   . H/O: knee surgery    15   . History of open heart surgery    ASCENDING AORTIC ANEURYSM  . Ischemic stroke of frontal lobe ([place]) 04/05/2017   Bilateral  . Seizure disorder ([place]) 04/05/2017  . Seizures ([place])   . Status post knee surgery    DVT POST KNEE SURGERY    Past Surgical History:  Procedure Laterality Date  . CARDIAC SURGERY     ANUERSYM MAY 2017   Bentall procedure. 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 CORONARY ARTERY BYPASS GRAFT  03/13/2017   LIMA to LAD with steril abcess removal from dacron graft  . FALSE ANEURYSM REPAIR  03/13/2017   at [facility]   . KNEE SURGERY  1983  . open heart surgery     03-13-2017  . REPLACEMENT TOTAL KNEE  2015  . ROTATOR CUFF REPAIR      Family History  Problem Relation Age of Onset  . Aneurysm Mother        brain aneurysm for mother.     Social History Social History   Tobacco Use  . Smoking status: Never Smoker  . Smokeless tobacco: Never Used  Substance Use Topics  . Alcohol use: No  . Drug use: No    Current Outpatient Medications  Medication Sig Dispense Refill  . aspirin 81 MG tablet Take 1 tablet (81 mg total) by mouth daily.    Marland Kitchen levETIRAcetam (KEPPRA) 750 MG tablet Take 1 tablet (750 mg total) by mouth 2 (two) times daily. Please have patient call to schedule follow up appointment. #[phone] 120 tablet 0  . metoprolol succinate (TOPROL-XL) 25 MG 24 hr tablet TAKE ONE-HALF TABLET BY MOUTH DAILY WITH OR IMMEDIATELY FOLLOWING A MEAL 45 tablet 3  . pravastatin (PRAVACHOL) 40 MG tablet TAKE ONE TABLET BY MOUTH EVERY EVENING 90 tablet 3  . tamsulosin (FLOMAX) 0.4 MG CAPS capsule Take 0.4 mg by mouth every evening.      No current facility-administered medications for this visit.     Allergies  Allergen Reactions  . Oxycodone Hcl     DELUSIONAL  .  Oxycontin [Oxycodone]     DELUSIONAL   . Percocet [Oxycodone-Acetaminophen]     DELUSIONAL  . Sulfur Rash    Review of Systems  No symptoms of chest pain or shortness of breath Epigastric hernia is minimally symptomatic No peripheral edema Patient scheduled for dental hygiene this winter for which he will take preoperative amoxicillin as instructed  BP (!) 142/83 (BP Location: Right Arm, Patient Position: Sitting, Cuff Size: Normal)   Pulse 76   Resp 18   Ht 5\' 10"  (1.778 m)   Wt 197 lb (89.4 kg)   SpO2 96% Comment: RA  BMI 28.27 kg/m  Physical Exam      Exam    General- alert and comfortable    Neck- no JVD, no cervical adenopathy palpable, no carotid bruit   Lungs- clear without rales, wheezes   Cor- regular rate and rhythm, no murmur , gallop   Abdomen- soft,  non-tender.  Small easily reducible two finger epigastric hernia   Extremities - warm, non-tender, minimal edema   Neuro- oriented, appropriate, no focal weakness   Diagnostic Tests: Recent CTA and echocardiogram from 5 months ago reviewed  Impression: Patient doing well from a cardiac status. He is cleared for carpal tunnel surgery for symptomatic right hand and wrist pain  Plan: Patient will return for follow-up in 3 to 4 months for blood pressure check and assessment of progress.   Len Childs, MD [facility] [phone]

## 2018-09-03 ENCOUNTER — Ambulatory Visit: Payer: Medicare HMO | Admitting: Cardiothoracic Surgery

## 2018-09-11 DIAGNOSIS — J069 Acute upper respiratory infection, unspecified: Secondary | ICD-10-CM | POA: Diagnosis not present

## 2018-09-11 DIAGNOSIS — M25512 Pain in left shoulder: Secondary | ICD-10-CM | POA: Diagnosis not present

## 2018-09-11 DIAGNOSIS — J0100 Acute maxillary sinusitis, unspecified: Secondary | ICD-10-CM | POA: Diagnosis not present

## 2018-09-23 ENCOUNTER — Other Ambulatory Visit: Payer: Self-pay | Admitting: Neurology

## 2018-09-23 DIAGNOSIS — G5601 Carpal tunnel syndrome, right upper limb: Secondary | ICD-10-CM | POA: Diagnosis not present

## 2018-10-07 DIAGNOSIS — M25641 Stiffness of right hand, not elsewhere classified: Secondary | ICD-10-CM | POA: Diagnosis not present

## 2018-10-23 DIAGNOSIS — M25631 Stiffness of right wrist, not elsewhere classified: Secondary | ICD-10-CM | POA: Diagnosis not present

## 2018-10-28 ENCOUNTER — Ambulatory Visit (INDEPENDENT_AMBULATORY_CARE_PROVIDER_SITE_OTHER): Payer: Medicare HMO | Admitting: Cardiology

## 2018-10-28 ENCOUNTER — Encounter: Payer: Self-pay | Admitting: Cardiology

## 2018-10-28 ENCOUNTER — Encounter (INDEPENDENT_AMBULATORY_CARE_PROVIDER_SITE_OTHER): Payer: Self-pay

## 2018-10-28 VITALS — BP 126/86 | HR 80 | Ht 70.0 in | Wt 198.0 lb

## 2018-10-28 DIAGNOSIS — I2510 Atherosclerotic heart disease of native coronary artery without angina pectoris: Secondary | ICD-10-CM | POA: Diagnosis not present

## 2018-10-28 DIAGNOSIS — Z951 Presence of aortocoronary bypass graft: Secondary | ICD-10-CM | POA: Diagnosis not present

## 2018-10-28 DIAGNOSIS — I2583 Coronary atherosclerosis due to lipid rich plaque: Secondary | ICD-10-CM | POA: Diagnosis not present

## 2018-10-28 DIAGNOSIS — I729 Aneurysm of unspecified site: Secondary | ICD-10-CM | POA: Diagnosis not present

## 2018-10-28 DIAGNOSIS — Z9889 Other specified postprocedural states: Secondary | ICD-10-CM

## 2018-10-28 DIAGNOSIS — Z8679 Personal history of other diseases of the circulatory system: Secondary | ICD-10-CM | POA: Diagnosis not present

## 2018-10-28 NOTE — Patient Instructions (Signed)
Medication Instructions:  The current medical regimen is effective;  continue present plan and medications.  If you need a refill on your cardiac medications before your next appointment, please call your pharmacy.   Follow-Up: At [facility], you and your health needs are our priority.  As part of our continuing mission to provide you with exceptional heart care, we have created designated Provider Care Teams.  These Care Teams include your primary Cardiologist (physician) and Advanced Practice Providers (APPs -  Physician Assistants and Nurse Practitioners) who all work together to provide you with the care you need, when you need it. You will need a follow up appointment in 12 months.  Please call our office 2 months in advance to schedule this appointment.  You may see Mark Skains, MD or one of the following Advanced Practice Providers on your designated Care Team:   Lori Gerhardt, NP Laura Ingold, NP . Jill McDaniel, NP  Thank you for choosing [facility]!!      

## 2018-10-28 NOTE — Progress Notes (Signed)
Cardiology Office Note:    Date:  10/28/2018   ID:  Erik Salazar, DOB 07/26/1951, MRN [id number]  PCP:  [name]  Cardiologist:  Candee Furbish, MD  Electrophysiologist:  None   Referring MD: [name]     History of Present Illness:    Erik Salazar is a 68 y.o. 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 After moving here, a follow up CTA bu Dr. 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 It measures over 6 cm in length. Patient asked for a second opinion at [facility]. The patient subsequently underwent redo sternotomy in the summer 2018 at [facility]. A 9 cm abscess collection along the graft was found. There is no false aneurysm. Gram stain of this area was negative. The abscess was debrided and the space was filled with omental flap performed at the same time by plastic surgery. Patient also had a repeat coronary graft using left IMA to LAD for a stenotic veingraft from the original operation.  He had a postoperative CVA with expressive aphasia and some swallowing difficulty. He is being followed here by Dr. [name]. He has undergone physical therapy and speech therapy. He has improved and now has a fairly good functional status. He has declined follow-up at [facility] and wishes to be followed [facility] for his aortic cardiac surgery.  Follow up CT shows resolution of abscess (had dye extravasation). ECHO with normal AV function. Plan is to have one year CTA by Dr. [name].   Dr. [name] has given him a disability placard. He indicated that he has trouble locating his car secondary to memory problems. He said he had no problem driving. He was instructed to taper off Keppra.  10/28/17-sinus rhythm 80 with occasional bigeminy noted on his EKG. he is doing well exercising 6 days a week, no syncope, no orthopnea, no chest pain.  He did still  state that he has some numbness in his first 3 finger distribution.  Wearing a brace.  10/28/2018- he is here for follow-up of ascending aneurysm, CAD CABG, stroke.  On 05/05/2018 he was in the emergency department with shortness of breath and chest pain.  Arrived as a daily issue in the substernal region moderate.  CT scan was performed as well as troponin BMP and EKG.  These results were reviewed and he had follow-up with Dr. [name].Marland Kitchen  He reviewed CT images with he and family.  Right hand weakness poor grip.  No symptoms of angina or CHF.  Small ventral hernia noted below xiphoid.  Not impressive on CT scan.  Aortic repair looks fine.  Dr. Amedeo Plenty evaluated hand.  Had carpal tunnel procedure. Gym 5 days weights.  Doing very well without any difficulty.  Using light weights.  Maintaining muscle mass.   Past Medical History:  Diagnosis Date  . Carpal tunnel syndrome on right 07/08/2017  . Cervical radiculopathy at C6 11/14/2017   Right  . Enlarged prostate   . H/O: knee surgery    15   . History of open heart surgery    ASCENDING AORTIC ANEURYSM  . Ischemic stroke of frontal lobe ([place]) 04/05/2017   Bilateral  . Seizure disorder ([place]) 04/05/2017  . Seizures ([place])   . Status post knee surgery    DVT POST KNEE SURGERY    Past Surgical History:  Procedure Laterality Date  . CARDIAC SURGERY  ANUERSYM MAY 2017   Bentall procedure. 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 CORONARY ARTERY BYPASS GRAFT  03/13/2017   LIMA to LAD with steril abcess removal from dacron graft  . FALSE ANEURYSM REPAIR  03/13/2017   at [facility]   . KNEE SURGERY  1983  . open heart surgery     03-13-2017  . REPLACEMENT TOTAL KNEE  2015  . ROTATOR CUFF REPAIR      Current Medications: Current Meds  Medication Sig  . aspirin 81 MG tablet Take 1 tablet (81 mg total) by  mouth daily.  Marland Kitchen levETIRAcetam (KEPPRA) 750 MG tablet Take 1 tablet by mouth twice daily  . metoprolol succinate (TOPROL-XL) 25 MG 24 hr tablet TAKE ONE-HALF TABLET BY MOUTH DAILY WITH OR IMMEDIATELY FOLLOWING A MEAL  . pravastatin (PRAVACHOL) 40 MG tablet TAKE ONE TABLET BY MOUTH EVERY EVENING  . tamsulosin (FLOMAX) 0.4 MG CAPS capsule Take 0.4 mg by mouth every evening.      Allergies:   Oxycodone hcl; Oxycontin [oxycodone]; Percocet [oxycodone-acetaminophen]; and Sulfur   Social History   Socioeconomic History  . Marital status: Married    Spouse name: Not on file  . Number of children: Not on file  . Years of education: Not on file  . Highest education level: Not on file  Occupational History  . Occupation: RETIRED  Social Needs  . Financial resource strain: Not on file  . Food insecurity:    Worry: Not on file    Inability: Not on file  . Transportation needs:    Medical: Not on file    Non-medical: Not on file  Tobacco Use  . Smoking status: Never Smoker  . Smokeless tobacco: Never Used  Substance and Sexual Activity  . Alcohol use: No  . Drug use: No  . Sexual activity: Yes  Lifestyle  . Physical activity:    Days per week: Not on file    Minutes per session: Not on file  . Stress: Not on file  Relationships  . Social connections:    Talks on phone: Not on file    Gets together: Not on file    Attends religious service: Not on file    Active member of club or organization: Not on file    Attends meetings of clubs or organizations: Not on file    Relationship status: Not on file  Other Topics Concern  . Not on file  Social History Narrative   Lives at home with wife, is retired.  Education: 2 yrs college.   2 Children.     Family History: The patient's family history includes Aneurysm in his mother.  ROS:   Please see the history of present illness.    No f/c/n/v All other systems reviewed and are negative.  EKGs/Labs/Other Studies Reviewed:    The  following studies were reviewed today: Prior office notes lab work EKG  EKG:  EKG is not ordered today.  T  Recent Labs: 05/05/2018: B Natriuretic Peptide 46.5; BUN 20; Creatinine, Ser 1.08; Hemoglobin 15.8; Platelets 275; Potassium 4.4; Sodium 135  Recent Lipid Panel    Component Value Date/Time   CHOL 199 12/13/2016 0942   TRIG 166 (H) 12/13/2016 0942   HDL 45 12/13/2016 0942   CHOLHDL 4.4 12/13/2016 0942   LDLCALC 121 (  H) 12/13/2016 7253    Physical Exam:    VS:  BP 126/86   Pulse 80   Ht 5\' 10"  (1.778 m)   Wt 198 lb (89.8 kg)   SpO2 95%   BMI 28.41 kg/m     Wt Readings from Last 3 Encounters:  10/28/18 198 lb (89.8 kg)  09/02/18 197 lb (89.4 kg)  05/14/18 194 lb 3.2 oz (88.1 kg)     GEN:  Well nourished, well developed in no acute distress HEENT: Normal NECK: No JVD; No carotid bruits LYMPHATICS: No lymphadenopathy CARDIAC: RRR, no murmurs, rubs, gallops, small ventral hernia noted RESPIRATORY:  Clear to auscultation without rales, wheezing or rhonchi  ABDOMEN: Soft, non-tender, non-distended MUSCULOSKELETAL:  No edema; No deformity  SKIN: Warm and dry NEUROLOGIC:  Alert and oriented x 3 PSYCHIATRIC:  Normal affect   ASSESSMENT:    1. S/P CABG x 2   2. Coronary artery disease due to lipid rich plaque   3. 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 H/O aortic valve repair    PLAN:    In order of problems listed above:  Ascending aortic aneurysm repair Bentall procedure bioprosthetic aortic valve replacement redo sternotomy secondary to false aneurysm at distal suture line stable - Dr. [name] has been monitoring.  Recent CT unremarkable.  Small ventral hernia noted.  Avoiding Cipro.  Recent EF normal.  Coronary artery disease status post CABG - To LAD, 03/13/2017, sterile abscess removed.  SVG from aorta to LAD and SVG to circumflex previously on 01/04/2016.  SVG to OM 2 patent.  No anginal symptoms.  Continue with aggressive secondary risk factor prevention.  Statin,  aspirin, beta-blocker.  Hyperlipidemia -Pravastatin, no changes.  He has had troubles in the past with Crestor atorvastatin other statins.  Leg discomfort.  History of stroke -Dr. [name] has been monitoring.  Mild cognitive issues.  1 Year follow-up seems reasonable.   Medication Adjustments/Labs and Tests Ordered: Current medicines are reviewed at length with the patient today.  Concerns regarding medicines are outlined above.  No orders of the defined types were placed in this encounter.  No orders of the defined types were placed in this encounter.   Patient Instructions  Medication Instructions:  The current medical regimen is effective;  continue present plan and medications.  If you need a refill on your cardiac medications before your next appointment, please call your pharmacy.   Follow-Up: At [facility], you and your health needs are our priority.  As part of our continuing mission to provide you with exceptional heart care, we have created designated Provider Care Teams.  These Care Teams include your primary Cardiologist (physician) and Advanced Practice Providers (APPs -  Physician Assistants and Nurse Practitioners) who all work together to provide you with the care you need, when you need it. You will need a follow up appointment in 12 months.  Please call our office 2 months in advance to schedule this appointment.  You may see Candee Furbish, MD or one of the following Advanced Practice Providers on your designated Care Team:   Truitt Merle, NP Cecilie Kicks, NP . Kathyrn Drown, NP  Thank you for choosing [facility]!!         Signed, Candee Furbish, MD  10/28/2018 10:17 AM    [place]

## 2018-11-07 DIAGNOSIS — M79641 Pain in right hand: Secondary | ICD-10-CM | POA: Diagnosis not present

## 2018-11-11 ENCOUNTER — Encounter: Payer: Self-pay | Admitting: Speech Pathology

## 2018-11-11 NOTE — Therapy (Signed)
[facility] [address] 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  Patient goals were not met. Patient is being discharged due to not returning since the last visit.  ?????          Past Medical History:  Diagnosis Date  . Carpal tunnel syndrome on right 07/08/2017  . Cervical radiculopathy at C6 11/14/2017   Right  . Enlarged prostate   . H/O: knee surgery    15   . History of open heart surgery    ASCENDING AORTIC ANEURYSM  . Ischemic stroke of frontal lobe ([place]) 04/05/2017   Bilateral  . Seizure disorder ([place]) 04/05/2017  . Seizures ([place])   . Status post knee surgery    DVT POST KNEE SURGERY    Past Surgical History:  Procedure Laterality Date  . CARDIAC SURGERY     ANUERSYM MAY 2017   Bentall procedure. 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 CORONARY ARTERY BYPASS GRAFT  03/13/2017   LIMA to LAD with steril abcess removal from dacron graft  . FALSE ANEURYSM REPAIR  03/13/2017   at [facility]   . KNEE SURGERY  1983  . open heart surgery     03-13-2017  . REPLACEMENT TOTAL KNEE  2015  . ROTATOR CUFF REPAIR      There were no vitals filed for this visit.             SLP Short Term Goals - 11/11/18 1224      SLP SHORT TERM GOAL #1   Title  Pt will carryover  2 memory strategies at home with rare min A over 3 sessions, per pt report    Time  3    Period  Weeks    Status  On-going      SLP SHORT TERM GOAL #2   Title  Pt will divide attention between 2 milldly complex cognitive linguistic tasks with 90% on each and rare min A over 2 sessions    Time  3    Period  Weeks    Status  On-going      SLP SHORT TERM GOAL #3   Title  Pt will report completing daily memory activities at home with rare min A over 3 sessions    Time  3    Period  Weeks    Status  On-going       SLP Long Term Goals - 11/11/18 1224      SLP LONG TERM GOAL #1   Title  Pt will carryover 3 memory strategies at home/work over 3 sessions with rare min A per pt report    Time  7    Period  Weeks    Status  Not Met      SLP LONG TERM GOAL #2   Title  Pt will divide  attention between 2 complex cognitive lingusitic activites with 85% on each and occasional min A    Time  7    Period  Weeks    Status  Not Met      SLP LONG TERM GOAL #3   Title  Pt will verbalize 3 compensations for attention for possbile return to work with occasional min A    Time  7    Period  Weeks    Status  Not Met         Patient will benefit from skilled therapeutic intervention in order to improve the following deficits and impairments:   No diagnosis found.    Problem List Patient Active Problem List   Diagnosis Date Noted  . Cervical radiculopathy at C6 11/14/2017  . Carpal tunnel syndrome on right 07/08/2017  . Tremor, essential 07/08/2017  . 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 Seizure disorder ([place]) 04/05/2017  . Right arm pain 04/05/2017    Lovvorn, Annye Rusk MS, CCC-SLP 11/11/2018, 12:25 PM  [facility] [address] [place], [place], [address] Phone: [phone]   Fax:  [phone]   Name: Erik Salazar MRN: [id number] Date of Birth: [birth year]

## 2018-11-12 DIAGNOSIS — M13841 Other specified arthritis, right hand: Secondary | ICD-10-CM | POA: Diagnosis not present

## 2018-11-12 DIAGNOSIS — Z5189 Encounter for other specified aftercare: Secondary | ICD-10-CM | POA: Diagnosis not present

## 2018-11-12 DIAGNOSIS — G5601 Carpal tunnel syndrome, right upper limb: Secondary | ICD-10-CM | POA: Diagnosis not present

## 2018-11-12 DIAGNOSIS — M79641 Pain in right hand: Secondary | ICD-10-CM | POA: Diagnosis not present

## 2018-11-12 DIAGNOSIS — M65332 Trigger finger, left middle finger: Secondary | ICD-10-CM | POA: Diagnosis not present

## 2018-11-17 DIAGNOSIS — H538 Other visual disturbances: Secondary | ICD-10-CM | POA: Diagnosis not present

## 2018-11-17 DIAGNOSIS — I639 Cerebral infarction, unspecified: Secondary | ICD-10-CM | POA: Diagnosis not present

## 2018-11-17 DIAGNOSIS — H2511 Age-related nuclear cataract, right eye: Secondary | ICD-10-CM | POA: Diagnosis not present

## 2018-11-17 DIAGNOSIS — I2510 Atherosclerotic heart disease of native coronary artery without angina pectoris: Secondary | ICD-10-CM | POA: Diagnosis not present

## 2018-11-18 ENCOUNTER — Other Ambulatory Visit: Payer: Self-pay | Admitting: Neurology

## 2018-11-19 ENCOUNTER — Other Ambulatory Visit: Payer: Self-pay | Admitting: Neurology

## 2018-12-11 ENCOUNTER — Telehealth: Payer: Self-pay

## 2018-12-11 NOTE — Telephone Encounter (Signed)
Email sent to [email or web address]

## 2018-12-11 NOTE — Telephone Encounter (Signed)
Noted thanks °

## 2018-12-11 NOTE — Telephone Encounter (Signed)
Due to current COVID 19 pandemic, our office is severely reducing in office visits for at least the next 2 weeks, in order to minimize the risk to our patients and healthcare providers.   I called pt, discussed this with him. He is agreeable to a virtual visit with Dr. [name] on 12/16/2018 at 9:00am.  Pt understands that although there may be some limitations with this type of visit, we will take all precautions to reduce any security or privacy concerns.  Pt understands that this will be treated like an in office visit and we will file with pt's insurance, and there may be a patient responsible charge related to this service.  Pt's email is [email or web address]. Pt understands that the [employer] webex software must be downloaded and operational on the device pt plans to use for the visit.  Pt's meds, allergies, and PMH were updated.  I offered pt a "test run" for the virtual visit with one of the RNs here, he will decide if he needs this and call us back. He will be using his laptop with a camera.

## 2018-12-16 ENCOUNTER — Ambulatory Visit (INDEPENDENT_AMBULATORY_CARE_PROVIDER_SITE_OTHER): Payer: Medicare HMO | Admitting: Neurology

## 2018-12-16 ENCOUNTER — Encounter: Payer: Self-pay | Admitting: Neurology

## 2018-12-16 ENCOUNTER — Other Ambulatory Visit: Payer: Self-pay

## 2018-12-16 DIAGNOSIS — G250 Essential tremor: Secondary | ICD-10-CM

## 2018-12-16 DIAGNOSIS — G5601 Carpal tunnel syndrome, right upper limb: Secondary | ICD-10-CM | POA: Diagnosis not present

## 2018-12-16 DIAGNOSIS — M5412 Radiculopathy, cervical region: Secondary | ICD-10-CM | POA: Diagnosis not present

## 2018-12-16 DIAGNOSIS — G40909 Epilepsy, unspecified, not intractable, without status epilepticus: Secondary | ICD-10-CM

## 2018-12-16 NOTE — Progress Notes (Signed)
Virtual Visit via Telephone Note  I connected with Erik Salazar on 12/16/18 at  9:00 AM EDT by telephone and verified that I am speaking with the correct person using two identifiers.   I discussed the limitations, risks, security and privacy concerns of performing an evaluation and management service by telephone and the availability of in person appointments. I also discussed with the patient that there may be a patient responsible charge related to this service. The patient expressed understanding and agreed to proceed.  The patient is at home, physician in the office.   History of Present Illness: Erik Salazar is a 68 year old right-handed white male with a history of bifrontal strokes following surgery on the ascending aorta in July 2018.  The patient had seizures around the time of the acute strokes, he was placed on Vimpat and Keppra, he was able to come off of the Vimpat.  Attempts at tapering the Keppra resulted in an increase in his essential tremors, the patient has wanted to stay on the [place].  The patient remains on 750 mg twice daily.  The patient has complained of right hand numbness and pain.  He was found to have right carpal tunnel syndrome and evidence of an overlying right C6 radiculopathy.  MRI of the cervical spine shows neuroforaminal stenosis bilaterally at the C5-6 level.  The patient has undergone carpal tunnel surgery but his right hand pain continues and may have actually increased.  Previously he claimed that turning his head to the right induced discomfort down the right arm, he no longer indicates this.  The patient is having some difficulty sleeping at night at times because of the right hand pain, he does not wish to go on any other medications for the discomfort.  He otherwise has not noted any new medical issues since last seen.  The patient has not had any recurrent seizures.   Observations/Objective: WebEx evaluation reveals that the patient is alert and  cooperative.  He is able to generate full extraocular movements.  He has good facial symmetry, he is able to protrude the tongue midline with good lateral movement of the tongue.  He has good range of movement the cervical spine.  He has good finger-nose-finger and heel shin bilaterally.  Gait is normal.  Tandem gait normal.  Romberg is negative.  Assessment and Plan: 1.  Right C6 radiculopathy  2.  Right carpal tunnel syndrome, status post surgery  3.  Bifrontal strokes, postsurgical  4.  History of seizures  5.  Essential tremors  The patient continues to have right arm discomfort that is likely related to the overlying C6 radiculopathy.  He is starting to have some numbness and discomfort in the left hand as well, he may have bilateral C6 impingement.  The patient is amenable to a neurosurgical consultation regarding the C6 radiculopathy.  We will get this set up.  The patient will remain on Keppra, mainly to help control his essential tremors.  Follow Up Instructions: Follow-up through this office in 1 year, may see nurse practitioner.   I discussed the assessment and treatment plan with the patient. The patient was provided an opportunity to ask questions and all were answered. The patient agreed with the plan and demonstrated an understanding of the instructions.   The patient was advised to call back or seek an in-person evaluation if the symptoms worsen or if the condition fails to improve as anticipated.  I provided 25 minutes of non-face-to-face time during this  encounter.   Kathrynn Ducking, MD

## 2018-12-22 ENCOUNTER — Ambulatory Visit: Payer: Medicare HMO | Admitting: Neurology

## 2018-12-23 ENCOUNTER — Other Ambulatory Visit: Payer: Self-pay

## 2018-12-24 ENCOUNTER — Encounter: Payer: Self-pay | Admitting: Cardiothoracic Surgery

## 2018-12-24 ENCOUNTER — Ambulatory Visit (INDEPENDENT_AMBULATORY_CARE_PROVIDER_SITE_OTHER): Payer: Medicare HMO | Admitting: Cardiothoracic Surgery

## 2018-12-24 VITALS — BP 146/86 | HR 82 | Temp 97.7°F | Resp 20 | Ht 70.0 in | Wt 199.0 lb

## 2018-12-24 DIAGNOSIS — Z9889 Other specified postprocedural states: Secondary | ICD-10-CM | POA: Diagnosis not present

## 2018-12-24 DIAGNOSIS — I712 Thoracic aortic aneurysm, without rupture, unspecified: Secondary | ICD-10-CM

## 2018-12-24 DIAGNOSIS — Z8679 Personal history of other diseases of the circulatory system: Secondary | ICD-10-CM | POA: Diagnosis not present

## 2018-12-24 NOTE — Progress Notes (Signed)
PCP is [facility], [facility] Referring Provider is Jerline Pain, MD  Chief Complaint  Patient presents with  . Thoracic Aortic Aneurysm    4 month f/u     HPI: Patient returns for scheduled office follow-up Patient status post biologic Bentall procedure in 2017 for ascending aneurysm and AVR.  Status post redo sternotomy in 2018 at [facility] for pseudoaneurysm of the aortic root and LIMA to LAD for stenosis of previously placed vein graft.  Patient was seen in 2019.  Echocardiogram at that time showed normal LV function, normal function of the aortic valve.  CTA showed normal graft repair of the ascending aorta without residual aneurysm or pseudoaneurysm.  \Patient now doing well without cardiac symptoms.  Blood pressure control with metoprolol 25 mg daily.  He is still following a heart healthy lifestyle with regards to diet and daily exercise including aerobic activity 30 minutes 6 days a week.  He understands he should not do heavy weightlifting because of his aortic surgery.  Patient's main complaint now is numbness in hands right greater than left.  Probably related to cervical spine disease at C6.  Scheduled to see neurosurgery for evaluation.    Past Medical History:  Diagnosis Date  . Carpal tunnel syndrome on right 07/08/2017  . Cervical radiculopathy at C6 11/14/2017   Right  . Enlarged prostate   . H/O: knee surgery    15   . History of open heart surgery    ASCENDING AORTIC ANEURYSM  . 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 Seizure disorder ([place]) 04/05/2017  . Seizures ([place])   . Status post knee surgery    DVT POST KNEE SURGERY    Past Surgical History:  Procedure Laterality Date  . CARDIAC SURGERY     ANUERSYM MAY 2017   Bentall procedure. 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  CORONARY ARTERY BYPASS GRAFT  03/13/2017   LIMA to LAD with steril abcess removal from dacron graft  . FALSE ANEURYSM REPAIR  03/13/2017   at [facility]   . KNEE SURGERY  1983  . open heart surgery     03-13-2017  . REPLACEMENT TOTAL KNEE  2015  . ROTATOR CUFF REPAIR      Family History  Problem Relation Age of Onset  . Aneurysm Mother        brain aneurysm for mother.     Social History Social History   Tobacco Use  . Smoking status: Never Smoker  . Smokeless tobacco: Never Used  Substance Use Topics  . Alcohol use: No  . Drug use: No    Current Outpatient Medications  Medication Sig Dispense Refill  . aspirin 81 MG tablet Take 1 tablet (81 mg total) by mouth daily.    Marland Kitchen levETIRAcetam (KEPPRA) 750 MG tablet TAKE ONE TABLET BY MOUTH TWICE A DAY 120 tablet 2  . metoprolol succinate (TOPROL-XL) 25 MG 24 hr tablet TAKE ONE-HALF TABLET BY MOUTH DAILY WITH OR IMMEDIATELY FOLLOWING A MEAL 45 tablet 3  . pravastatin (PRAVACHOL) 40 MG tablet TAKE ONE TABLET BY MOUTH EVERY EVENING 90 tablet 3  . tamsulosin (FLOMAX) 0.4 MG CAPS capsule Take 0.4 mg by mouth every evening.      No current facility-administered medications for this visit.  Allergies  Allergen Reactions  . Oxycodone Hcl     DELUSIONAL  . Oxycontin [Oxycodone]     DELUSIONAL   . Percocet [Oxycodone-Acetaminophen]     DELUSIONAL  . Sulfur Rash    Review of Systems  Weight stable Follows regular dental hygiene and dental cleaning with antibiotic prophylaxis No ankle edema No chest pain No shortness of breath No presyncope Positive for bilateral finger numbness related to cervical spine disease  BP (!) 146/86   Pulse 82   Temp 97.7 F (36.5 C) (Skin)   Resp 20   Ht 5\' 10"  (1.778 m)   Wt 199 lb (90.3 kg)   SpO2 98% Comment: RA  BMI 28.55 kg/m  Physical Exam      Exam    General- alert and comfortable    Neck- no JVD, no cervical adenopathy palpable, no carotid bruit   Lungs- clear without rales,  wheezes   Cor- regular rate and rhythm, no murmur , gallop   Abdomen- soft, non-tender   Extremities - warm, non-tender, minimal edema   Neuro- oriented, appropriate, no focal weakness  Diagnostic Tests: Most recent echo and CTA reviewed.  Both look fine.  Impression: Patient doing well now.  He is following a heart healthy lifestyle with respect to diet and exercise program.  He is very committed and discipline.  I told the patient his risk for recurrent aortic disease or aortic valve disease is very small.  His main risk going forward to be redeveloping coronary disease and he is doing everything possible to prevent that.  Plan: I will plan on seeing the patient back in mid 2021 with reassessment of his aorta with a surveillance CTA and also probable echocardiogram to assess biologic aortic valve if agreeable with his cardiologist Dr. Marlou Porch. He certainly understands importance of heart healthy lifestyle. He understands importance of good dental hygiene and antibiotic prophylaxis with his bioprosthetic  aortic valve Len Childs, MD [facility] [phone]

## 2019-02-05 ENCOUNTER — Other Ambulatory Visit: Payer: Self-pay | Admitting: Cardiology

## 2019-02-09 DIAGNOSIS — M545 Low back pain: Secondary | ICD-10-CM | POA: Diagnosis not present

## 2019-02-09 DIAGNOSIS — G8929 Other chronic pain: Secondary | ICD-10-CM | POA: Diagnosis not present

## 2019-03-04 ENCOUNTER — Emergency Department (HOSPITAL_BASED_OUTPATIENT_CLINIC_OR_DEPARTMENT_OTHER)
Admission: EM | Admit: 2019-03-04 | Discharge: 2019-03-04 | Disposition: A | Discharge status: Home / Self Care | Payer: Medicare HMO | Attending: Emergency Medicine | Admitting: Emergency Medicine

## 2019-03-04 ENCOUNTER — Other Ambulatory Visit: Payer: Self-pay

## 2019-03-04 ENCOUNTER — Encounter (HOSPITAL_BASED_OUTPATIENT_CLINIC_OR_DEPARTMENT_OTHER): Payer: Self-pay

## 2019-03-04 DIAGNOSIS — Y9389 Activity, other specified: Secondary | ICD-10-CM | POA: Diagnosis not present

## 2019-03-04 DIAGNOSIS — Z5321 Procedure and treatment not carried out due to patient leaving prior to being seen by health care provider: Secondary | ICD-10-CM | POA: Insufficient documentation

## 2019-03-04 DIAGNOSIS — M542 Cervicalgia: Secondary | ICD-10-CM | POA: Insufficient documentation

## 2019-03-04 DIAGNOSIS — Y999 Unspecified external cause status: Secondary | ICD-10-CM | POA: Insufficient documentation

## 2019-03-04 DIAGNOSIS — Y92410 Unspecified street and highway as the place of occurrence of the external cause: Secondary | ICD-10-CM | POA: Insufficient documentation
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
## 2019-03-05 ENCOUNTER — Other Ambulatory Visit: Payer: Self-pay

## 2019-03-05 ENCOUNTER — Telehealth: Payer: Self-pay

## 2019-03-05 ENCOUNTER — Emergency Department (HOSPITAL_COMMUNITY)
Admission: EM | Admit: 2019-03-05 | Discharge: 2019-03-05 | Disposition: A | Discharge status: Home / Self Care | Payer: Medicare HMO | Attending: Emergency Medicine | Admitting: Emergency Medicine

## 2019-03-05 ENCOUNTER — Emergency Department (HOSPITAL_COMMUNITY): Payer: Medicare HMO

## 2019-03-05 DIAGNOSIS — R0789 Other chest pain: Secondary | ICD-10-CM | POA: Insufficient documentation

## 2019-03-05 DIAGNOSIS — Z7982 Long term (current) use of aspirin: Secondary | ICD-10-CM | POA: Diagnosis not present

## 2019-03-05 DIAGNOSIS — S0990XA Unspecified injury of head, initial encounter: Secondary | ICD-10-CM | POA: Diagnosis not present

## 2019-03-05 DIAGNOSIS — M542 Cervicalgia: Secondary | ICD-10-CM | POA: Insufficient documentation

## 2019-03-05 DIAGNOSIS — Z951 Presence of aortocoronary bypass graft: Secondary | ICD-10-CM | POA: Diagnosis not present

## 2019-03-05 DIAGNOSIS — Z79899 Other long term (current) drug therapy: Secondary | ICD-10-CM | POA: Insufficient documentation

## 2019-03-05 DIAGNOSIS — M545 Low back pain: Secondary | ICD-10-CM | POA: Diagnosis not present

## 2019-03-05 DIAGNOSIS — S199XXA Unspecified injury of neck, initial encounter: Secondary | ICD-10-CM | POA: Diagnosis not present

## 2019-03-05 DIAGNOSIS — Z8673 Personal history of transient ischemic attack (TIA), and cerebral infarction without residual deficits: Secondary | ICD-10-CM | POA: Insufficient documentation

## 2019-03-05 DIAGNOSIS — S299XXA Unspecified injury of thorax, initial encounter: Secondary | ICD-10-CM | POA: Diagnosis not present

## 2019-03-05 DIAGNOSIS — S4991XA Unspecified injury of right shoulder and upper arm, initial encounter: Secondary | ICD-10-CM | POA: Diagnosis not present

## 2019-03-05 DIAGNOSIS — M25511 Pain in right shoulder: Secondary | ICD-10-CM | POA: Diagnosis not present

## 2019-03-05 DIAGNOSIS — R0781 Pleurodynia: Secondary | ICD-10-CM | POA: Diagnosis not present

## 2019-03-05 LAB — BASIC METABOLIC PANEL
Anion gap: 12 (ref 5–15)
BUN: 13 mg/dL (ref 8–23)
CO2: 20 mmol/L — ABNORMAL LOW (ref 22–32)
Calcium: 8.9 mg/dL (ref 8.9–10.3)
Chloride: 103 mmol/L (ref 98–111)
Creatinine, Ser: 1.02 mg/dL (ref 0.61–1.24)
GFR calc Af Amer: >60 mL/min (ref >60)
GFR calc non Af Amer: >60 mL/min (ref >60)
Glucose, Bld: 113 mg/dL — ABNORMAL HIGH (ref 70–99)
Potassium: 4.2 mmol/L (ref 3.5–5.1)
Sodium: 135 mmol/L (ref 135–145)

## 2019-03-05 LAB — CBC
HCT: 45.4 % (ref 39.0–52.0)
Hemoglobin: 14.7 g/dL (ref 13.0–17.0)
MCH: 28.1 pg (ref 26.0–34.0)
MCHC: 32.4 g/dL (ref 30.0–36.0)
MCV: 86.8 fL (ref 80.0–100.0)
Platelets: 278 10*3/uL (ref 150–400)
RBC: 5.23 MIL/uL (ref 4.22–5.81)
RDW: 13.7 % (ref 11.5–15.5)
WBC: 9 10*3/uL (ref 4.0–10.5)
nRBC: 0 % (ref 0.0–0.2)

## 2019-03-05 LAB — TROPONIN I (HIGH SENSITIVITY): Troponin I (High Sensitivity): 6 ng/L (ref <18)

## 2019-03-05 MED ORDER — KETOROLAC TROMETHAMINE 30 MG/ML IJ SOLN
30.0000 mg | Freq: Once | INTRAMUSCULAR | Status: AC
  Administered 2019-03-05: 30 mg via INTRAMUSCULAR
  Filled 2019-03-05: qty 1

## 2019-03-05 NOTE — Discharge Instructions (Signed)
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
## 2019-03-05 NOTE — ED Notes (Signed)
Patients wife  Raiford Noble [phone] CALL FOR UPDATES
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
## 2019-03-09 ENCOUNTER — Telehealth: Payer: Self-pay | Admitting: Neurology
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
## 2019-03-09 NOTE — Telephone Encounter (Signed)
I called the patient, left a message, I will call back later. 

## 2019-03-31 DIAGNOSIS — K219 Gastro-esophageal reflux disease without esophagitis: Secondary | ICD-10-CM | POA: Diagnosis not present

## 2019-03-31 DIAGNOSIS — R1310 Dysphagia, unspecified: Secondary | ICD-10-CM | POA: Diagnosis not present

## 2019-04-24 ENCOUNTER — Other Ambulatory Visit: Payer: Self-pay | Admitting: Cardiology

## 2019-04-30 ENCOUNTER — Other Ambulatory Visit: Payer: Self-pay | Admitting: Neurology

## 2019-05-14 DIAGNOSIS — Z23 Encounter for immunization: Secondary | ICD-10-CM | POA: Diagnosis not present

## 2019-05-21 DIAGNOSIS — Z23 Encounter for immunization: Secondary | ICD-10-CM | POA: Diagnosis not present

## 2019-05-21 DIAGNOSIS — L821 Other seborrheic keratosis: Secondary | ICD-10-CM | POA: Diagnosis not present

## 2019-05-21 DIAGNOSIS — Z85828 Personal history of other malignant neoplasm of skin: Secondary | ICD-10-CM | POA: Diagnosis not present

## 2019-05-21 DIAGNOSIS — L814 Other melanin hyperpigmentation: Secondary | ICD-10-CM | POA: Diagnosis not present

## 2019-05-21 DIAGNOSIS — L570 Actinic keratosis: Secondary | ICD-10-CM | POA: Diagnosis not present

## 2019-05-21 DIAGNOSIS — D225 Melanocytic nevi of trunk: Secondary | ICD-10-CM | POA: Diagnosis not present

## 2019-05-26 DIAGNOSIS — K219 Gastro-esophageal reflux disease without esophagitis: Secondary | ICD-10-CM | POA: Diagnosis not present

## 2019-05-26 DIAGNOSIS — Z9889 Other specified postprocedural states: Secondary | ICD-10-CM | POA: Diagnosis not present

## 2019-05-26 DIAGNOSIS — R1310 Dysphagia, unspecified: Secondary | ICD-10-CM | POA: Diagnosis not present

## 2019-05-26 DIAGNOSIS — Z8679 Personal history of other diseases of the circulatory system: Secondary | ICD-10-CM | POA: Diagnosis not present

## 2019-05-29 ENCOUNTER — Other Ambulatory Visit: Payer: Self-pay | Admitting: Gastroenterology

## 2019-06-19 ENCOUNTER — Other Ambulatory Visit (HOSPITAL_COMMUNITY)
Admission: RE | Admit: 2019-06-19 | Discharge: 2019-06-19 | Disposition: A | Discharge status: Home / Self Care | Payer: Medicare HMO | Source: Ambulatory Visit | Attending: Gastroenterology | Admitting: Gastroenterology

## 2019-06-19 DIAGNOSIS — Z01812 Encounter for preprocedural laboratory examination: Secondary | ICD-10-CM | POA: Diagnosis not present

## 2019-06-19 DIAGNOSIS — Z20828 Contact with and (suspected) exposure to other viral communicable diseases: Secondary | ICD-10-CM | POA: Insufficient documentation

## 2019-06-20 LAB — NOVEL CORONAVIRUS, NAA (HOSP ORDER, SEND-OUT TO REF LAB; TAT 18-24 HRS): SARS-CoV-2, NAA: NOT DETECTED

## 2019-06-22 ENCOUNTER — Other Ambulatory Visit: Payer: Self-pay

## 2019-06-22 ENCOUNTER — Encounter (HOSPITAL_COMMUNITY): Payer: Self-pay | Admitting: *Deleted

## 2019-06-23 ENCOUNTER — Encounter (HOSPITAL_COMMUNITY)
Admission: RE | Disposition: A | Discharge status: Home / Self Care | Payer: Self-pay | Source: Home / Self Care | Attending: Gastroenterology

## 2019-06-23 ENCOUNTER — Encounter (HOSPITAL_COMMUNITY): Payer: Self-pay | Admitting: *Deleted

## 2019-06-23 ENCOUNTER — Other Ambulatory Visit: Payer: Self-pay

## 2019-06-23 ENCOUNTER — Ambulatory Visit (HOSPITAL_COMMUNITY): Payer: Medicare HMO | Admitting: Anesthesiology

## 2019-06-23 ENCOUNTER — Ambulatory Visit (HOSPITAL_COMMUNITY)
Admission: RE | Admit: 2019-06-23 | Discharge: 2019-06-23 | Disposition: A | Discharge status: Home / Self Care | Payer: Medicare HMO | Attending: Gastroenterology | Admitting: Gastroenterology

## 2019-06-23 DIAGNOSIS — Z952 Presence of prosthetic heart valve: Secondary | ICD-10-CM | POA: Insufficient documentation

## 2019-06-23 DIAGNOSIS — I10 Essential (primary) hypertension: Secondary | ICD-10-CM | POA: Insufficient documentation

## 2019-06-23 DIAGNOSIS — K449 Diaphragmatic hernia without obstruction or gangrene: Secondary | ICD-10-CM | POA: Diagnosis not present

## 2019-06-23 DIAGNOSIS — F17290 Nicotine dependence, other tobacco product, uncomplicated: Secondary | ICD-10-CM | POA: Diagnosis not present

## 2019-06-23 DIAGNOSIS — Z951 Presence of aortocoronary bypass graft: Secondary | ICD-10-CM | POA: Diagnosis not present

## 2019-06-23 DIAGNOSIS — I252 Old myocardial infarction: Secondary | ICD-10-CM | POA: Insufficient documentation

## 2019-06-23 DIAGNOSIS — Z79899 Other long term (current) drug therapy: Secondary | ICD-10-CM | POA: Insufficient documentation

## 2019-06-23 DIAGNOSIS — Z8673 Personal history of transient ischemic attack (TIA), and cerebral infarction without residual deficits: Secondary | ICD-10-CM | POA: Diagnosis not present

## 2019-06-23 DIAGNOSIS — R1310 Dysphagia, unspecified: Secondary | ICD-10-CM | POA: Diagnosis not present

## 2019-06-23 DIAGNOSIS — N400 Enlarged prostate without lower urinary tract symptoms: Secondary | ICD-10-CM | POA: Insufficient documentation

## 2019-06-23 DIAGNOSIS — M5412 Radiculopathy, cervical region: Secondary | ICD-10-CM | POA: Diagnosis not present

## 2019-06-23 DIAGNOSIS — K222 Esophageal obstruction: Secondary | ICD-10-CM | POA: Insufficient documentation

## 2019-06-23 DIAGNOSIS — R569 Unspecified convulsions: Secondary | ICD-10-CM | POA: Insufficient documentation

## 2019-06-23 DIAGNOSIS — R130 Aphagia: Secondary | ICD-10-CM | POA: Diagnosis not present

## 2019-06-23 DIAGNOSIS — Z7982 Long term (current) use of aspirin: Secondary | ICD-10-CM | POA: Insufficient documentation

## 2019-06-23 DIAGNOSIS — Z882 Allergy status to sulfonamides status: Secondary | ICD-10-CM | POA: Diagnosis not present

## 2019-06-23 DIAGNOSIS — R1314 Dysphagia, pharyngoesophageal phase: Secondary | ICD-10-CM | POA: Diagnosis present

## 2019-06-23 DIAGNOSIS — K228 Other specified diseases of esophagus: Secondary | ICD-10-CM | POA: Insufficient documentation

## 2019-06-23 DIAGNOSIS — K219 Gastro-esophageal reflux disease without esophagitis: Secondary | ICD-10-CM | POA: Insufficient documentation

## 2019-06-23 DIAGNOSIS — R69 Illness, unspecified: Secondary | ICD-10-CM | POA: Diagnosis not present

## 2019-06-23 DIAGNOSIS — I639 Cerebral infarction, unspecified: Secondary | ICD-10-CM | POA: Diagnosis not present

## 2019-06-23 DIAGNOSIS — K2950 Unspecified chronic gastritis without bleeding: Secondary | ICD-10-CM | POA: Diagnosis not present

## 2019-06-23 DIAGNOSIS — Z885 Allergy status to narcotic agent status: Secondary | ICD-10-CM | POA: Insufficient documentation

## 2019-06-23 HISTORY — DX: Other complications of anesthesia, initial encounter: T88.59XA

## 2019-06-23 HISTORY — PX: ESOPHAGOGASTRODUODENOSCOPY (EGD) WITH PROPOFOL: SHX5813

## 2019-06-23 HISTORY — PX: BIOPSY: SHX5522

## 2019-06-23 HISTORY — DX: Other specified postprocedural states: Z98.890

## 2019-06-23 HISTORY — PX: BALLOON DILATION: SHX5330

## 2019-06-23 HISTORY — DX: Other specified postprocedural states: R11.2

## 2019-06-23 SURGERY — ESOPHAGOGASTRODUODENOSCOPY (EGD) WITH PROPOFOL
Anesthesia: Monitor Anesthesia Care

## 2019-06-23 MED ORDER — SODIUM CHLORIDE 0.9 % IV SOLN: INTRAVENOUS | Status: DC

## 2019-06-23 MED ORDER — PANTOPRAZOLE SODIUM 40 MG PO TBEC: 40.0000 mg | DELAYED_RELEASE_TABLET | Freq: Every day | ORAL | 1 refills | Status: DC

## 2019-06-23 MED ORDER — PROPOFOL 500 MG/50ML IV EMUL
INTRAVENOUS | Status: DC | PRN
  Administered 2019-06-23: 135 ug/kg/min via INTRAVENOUS

## 2019-06-23 MED ORDER — PROPOFOL 10 MG/ML IV BOLUS
INTRAVENOUS | Status: AC
  Filled 2019-06-23: qty 20

## 2019-06-23 MED ORDER — LACTATED RINGERS IV SOLN
INTRAVENOUS | Status: DC
  Administered 2019-06-23: 09:00:00 1000 mL via INTRAVENOUS

## 2019-06-23 MED ORDER — PROPOFOL 500 MG/50ML IV EMUL
INTRAVENOUS | Status: DC | PRN
  Administered 2019-06-23: 50 mg via INTRAVENOUS

## 2019-06-23 MED ORDER — PHENYLEPHRINE HCL (PRESSORS) 10 MG/ML IV SOLN
INTRAVENOUS | Status: DC | PRN
  Administered 2019-06-23: 40 ug via INTRAVENOUS

## 2019-06-23 MED ORDER — ONDANSETRON HCL 4 MG/2ML IJ SOLN
INTRAMUSCULAR | Status: DC | PRN
  Administered 2019-06-23: 4 mg via INTRAVENOUS

## 2019-06-23 MED ORDER — LIDOCAINE HCL (CARDIAC) PF 100 MG/5ML IV SOSY
PREFILLED_SYRINGE | INTRAVENOUS | Status: DC | PRN
  Administered 2019-06-23: 50 mg via INTRAVENOUS

## 2019-06-23 SURGICAL SUPPLY — 15 items

## 2019-06-23 NOTE — Discharge Instructions (Signed)

## 2019-06-23 NOTE — Op Note (Signed)
[facility] Patient Name: Erik Salazar Procedure Date: 06/23/2019 MRN: [id number] Attending MD: Otis Brace , MD Date of Birth: 1951/03/20 CSN: [id number] Age: 68 Admit Type: Outpatient Procedure:                Upper GI endoscopy Indications:              Dysphagia Providers:                Otis Brace, MD, Cleda Daub, RN, Lazaro Arms, Technician Referring MD:              Medicines:                Sedation Administered by an Anesthesia Professional Complications:            No immediate complications. Estimated Blood Loss:     Estimated blood loss was minimal. Procedure:                Pre-Anesthesia Assessment:                           - Prior to the procedure, a History and Physical                            was performed, and patient medications and                            allergies were reviewed. The patient's tolerance of                            previous anesthesia was also reviewed. The risks                            and benefits of the procedure and the sedation                            options and risks were discussed with the patient.                            All questions were answered, and informed consent                            was obtained. Prior Anticoagulants: The patient has                            taken no previous anticoagulant or antiplatelet                            agents except for aspirin. ASA Grade Assessment: II                            - A patient with mild systemic disease. After  reviewing the risks and benefits, the patient was                            deemed in satisfactory condition to undergo the                            procedure.                           After obtaining informed consent, the endoscope was                            passed under direct vision. Throughout the                            procedure, the patient's blood  pressure, pulse, and                            oxygen saturations were monitored continuously. The                            [id number]) Olympus gastroscope was                            introduced through the mouth, and advanced to the                            second part of duodenum. The upper GI endoscopy was                            accomplished without difficulty. The patient                            tolerated the procedure well. Scope In: Scope Out: Findings:      A non-obstructing Schatzki ring was found at the gastroesophageal       junction at 38 cm. A TTS dilator was passed through the scope. Dilation       with a 15-16.5-18 mm balloon dilator was performed to 18 mm. The       dilation site was examined following endoscope reinsertion and showed no       perforation.Superficial mucosal tear noted after dilation.      A small hiatal hernia was present.      Scattered mild mucosal changes characterized by altered texture and       white specks were found in the distal esophagus. Biopsies were taken       with a cold forceps for histology.      Normal mucosa was found in the entire examined stomach. Biopsies were       taken with a cold forceps for histology.      The cardia and gastric fundus were normal on retroflexion.      The duodenal bulb, first portion of the duodenum and second portion of       the duodenum were normal. Impression:               - Non-obstructing Schatzki ring. Dilated.                           -  Small hiatal hernia.                           - Texture changed, white specked mucosa in the                            esophagus. Biopsied.                           - Normal mucosa was found in the entire stomach.                            Biopsied.                           - Normal duodenal bulb, first portion of the                            duodenum and second portion of the duodenum. Moderate Sedation:      Moderate (conscious)  sedation was personally administered by an       anesthesia professional. The following parameters were monitored: oxygen       saturation, heart rate, blood pressure, and response to care. Recommendation:           - Patient has a contact number available for                            emergencies. The signs and symptoms of potential                            delayed complications were discussed with the                            patient. Return to normal activities tomorrow.                            Written discharge instructions were provided to the                            patient.                           - Resume previous diet.                           - Continue present medications.                           - Await pathology results.                           - Repeat upper endoscopy PRN for retreatment.                           - Return to my office as previously scheduled. Procedure Code(s):        --- Professional ---  [id number], Esophagogastroduodenoscopy, flexible,                            transoral; with transendoscopic balloon dilation of                            esophagus (less than 30 mm diameter)                           43239, 59, Esophagogastroduodenoscopy, flexible,                            transoral; with biopsy, single or multiple Diagnosis Code(s):        --- Professional ---                           K22.2, Esophageal obstruction                           K44.9, Diaphragmatic hernia without obstruction or                            gangrene                           K22.8, Other specified diseases of esophagus                           R13.10, Dysphagia, unspecified CPT copyright 2019 American Medical Association. All rights reserved. The codes documented in this report are preliminary and upon coder review may  be revised to meet current compliance requirements. Otis Brace, MD Otis Brace, MD 06/23/2019 10:30:20  AM Number of Addenda: 0

## 2019-06-23 NOTE — Anesthesia Postprocedure Evaluation (Signed)
Anesthesia Post Note  Patient: Erik Salazar  Procedure(s) Performed: ESOPHAGOGASTRODUODENOSCOPY (EGD) WITH PROPOFOL (N/A ) BALLOON DILATION (N/A )     Patient location during evaluation: Endoscopy Anesthesia Type: MAC Level of consciousness: sedated Pain management: pain level controlled Vital Signs Assessment: post-procedure vital signs reviewed and stable Respiratory status: spontaneous breathing Cardiovascular status: stable Postop Assessment: no apparent nausea or vomiting Anesthetic complications: no    Last Vitals:  Vitals:   06/23/19 1040 06/23/19 1045  BP: (!) 130/48   Pulse: 71 75  Resp: 20 20  Temp:    SpO2: 97% 99%    Last Pain:  Vitals:   06/23/19 1045  TempSrc:   PainSc: 0-No pain   Pain Goal:                   [name]

## 2019-06-23 NOTE — Transfer of Care (Signed)
Immediate Anesthesia Transfer of Care Note  Patient: Erik Salazar  Procedure(s) Performed: Procedure(s): ESOPHAGOGASTRODUODENOSCOPY (EGD) WITH PROPOFOL (N/A) BALLOON DILATION (N/A)  Patient Location: Endoscopy Unit  Anesthesia Type:MAC  Level of Consciousness:  sedated, patient cooperative and responds to stimulation  Airway & Oxygen Therapy:Patient Spontanous Breathing and Patient connected to face mask oxgen  Post-op Assessment:  Report given to PACU RN and Post -op Vital signs reviewed and stable  Post vital signs:  Reviewed and stable  Last Vitals:  Vitals:   06/23/19 0831 06/23/19 0850  BP: (!) 154/67   Pulse: 76   Resp: 16   Temp:  36.9 C  SpO2: 15%     Complications: No apparent anesthesia complications

## 2019-06-23 NOTE — H&P (Signed)
Erik Salazar is a 68 y.o. male was seen in the office for evaluation of esophageal dysphagia.    The various methods of treatment have been discussed with the patient and family. After consideration of risks, benefits and other options for treatment, the patient has consented to  Procedure(s): EGD with possible dilation as a surgical intervention .  The patient's history has been reviewed, patient examined, no change in status, stable for surgery.  I have reviewed the patient's chart and labs.  Questions were answered to the patient's satisfaction.   Risks (bleeding, infection, bowel perforation that could require surgery, sedation-related changes in cardiopulmonary systems), benefits (identification and possible treatment of source of symptoms, exclusion of certain causes of symptoms), and alternatives (watchful waiting, radiographic imaging studies, empiric medical treatment)  were explained to patient in detail and patient wishes to proceed.  Otis Brace MD, [place] 06/23/2019, 9:45 AM  Contact #  [phone]

## 2019-06-23 NOTE — Anesthesia Preprocedure Evaluation (Signed)
Anesthesia Evaluation  Patient identified by MRN, date of birth, ID band Patient awake    Reviewed: Allergy & Precautions, NPO status , Patient's Chart, lab work & pertinent test results, reviewed documented beta blocker date and time   History of Anesthesia Complications (+) PONV and history of anesthetic complications  Airway Mallampati: I       Dental no notable dental hx. (+) Teeth Intact   Pulmonary neg pulmonary ROS,    Pulmonary exam normal breath sounds clear to auscultation       Cardiovascular hypertension, Pt. on home beta blockers Normal cardiovascular exam Rhythm:Regular Rate:Normal     Neuro/Psych Seizures -, Well Controlled,  negative psych ROS   GI/Hepatic Neg liver ROS,   Endo/Other  negative endocrine ROS  Renal/GU negative Renal ROS  negative genitourinary   Musculoskeletal   Abdominal Normal abdominal exam  (+)   Peds  Hematology   Anesthesia Other Findings   Reproductive/Obstetrics                             Anesthesia Physical Anesthesia Plan  ASA: II  Anesthesia Plan: MAC   Post-op Pain Management:    Induction:   PONV Risk Score and Plan: 2 and Ondansetron and Dexamethasone  Airway Management Planned: Mask and Natural Airway  Additional Equipment: None  Intra-op Plan:   Post-operative Plan:   Informed Consent: I have reviewed the patients History and Physical, chart, labs and discussed the procedure including the risks, benefits and alternatives for the proposed anesthesia with the patient or authorized representative who has indicated his/her understanding and acceptance.     Dental advisory given  Plan Discussed with: CRNA  Anesthesia Plan Comments:         Anesthesia Quick Evaluation

## 2019-06-24 ENCOUNTER — Encounter (HOSPITAL_COMMUNITY): Payer: Self-pay | Admitting: Gastroenterology

## 2019-06-30 LAB — SURGICAL PATHOLOGY

## 2019-07-02 ENCOUNTER — Other Ambulatory Visit: Payer: Self-pay | Admitting: Cardiothoracic Surgery

## 2019-07-02 DIAGNOSIS — Z9889 Other specified postprocedural states: Secondary | ICD-10-CM

## 2019-07-02 DIAGNOSIS — I712 Thoracic aortic aneurysm, without rupture, unspecified: Secondary | ICD-10-CM

## 2019-07-02 DIAGNOSIS — Z8679 Personal history of other diseases of the circulatory system: Secondary | ICD-10-CM

## 2019-07-28 ENCOUNTER — Other Ambulatory Visit: Payer: Self-pay | Admitting: Cardiology

## 2019-07-29 ENCOUNTER — Emergency Department (HOSPITAL_COMMUNITY)
Admission: EM | Admit: 2019-07-29 | Discharge: 2019-07-29 | Disposition: A | Discharge status: Home / Self Care | Payer: Medicare HMO | Attending: Emergency Medicine | Admitting: Emergency Medicine

## 2019-07-29 ENCOUNTER — Emergency Department (HOSPITAL_COMMUNITY): Payer: Medicare HMO

## 2019-07-29 ENCOUNTER — Other Ambulatory Visit: Payer: Self-pay

## 2019-07-29 DIAGNOSIS — M546 Pain in thoracic spine: Secondary | ICD-10-CM | POA: Diagnosis not present

## 2019-07-29 DIAGNOSIS — N200 Calculus of kidney: Secondary | ICD-10-CM | POA: Diagnosis not present

## 2019-07-29 DIAGNOSIS — Z96659 Presence of unspecified artificial knee joint: Secondary | ICD-10-CM | POA: Insufficient documentation

## 2019-07-29 DIAGNOSIS — Z79899 Other long term (current) drug therapy: Secondary | ICD-10-CM | POA: Insufficient documentation

## 2019-07-29 DIAGNOSIS — R079 Chest pain, unspecified: Secondary | ICD-10-CM | POA: Diagnosis not present

## 2019-07-29 DIAGNOSIS — Z8673 Personal history of transient ischemic attack (TIA), and cerebral infarction without residual deficits: Secondary | ICD-10-CM | POA: Diagnosis not present

## 2019-07-29 DIAGNOSIS — Z951 Presence of aortocoronary bypass graft: Secondary | ICD-10-CM | POA: Diagnosis not present

## 2019-07-29 DIAGNOSIS — M25512 Pain in left shoulder: Secondary | ICD-10-CM | POA: Diagnosis present

## 2019-07-29 DIAGNOSIS — Z7982 Long term (current) use of aspirin: Secondary | ICD-10-CM | POA: Insufficient documentation

## 2019-07-29 DIAGNOSIS — K760 Fatty (change of) liver, not elsewhere classified: Secondary | ICD-10-CM | POA: Diagnosis not present

## 2019-07-29 LAB — BASIC METABOLIC PANEL
Anion gap: 11 (ref 5–15)
BUN: 21 mg/dL (ref 8–23)
CO2: 24 mmol/L (ref 22–32)
Calcium: 9.3 mg/dL (ref 8.9–10.3)
Chloride: 99 mmol/L (ref 98–111)
Creatinine, Ser: 0.99 mg/dL (ref 0.61–1.24)
GFR calc Af Amer: >60 mL/min (ref >60)
GFR calc non Af Amer: >60 mL/min (ref >60)
Glucose, Bld: 111 mg/dL — ABNORMAL HIGH (ref 70–99)
Potassium: 4.2 mmol/L (ref 3.5–5.1)
Sodium: 134 mmol/L — ABNORMAL LOW (ref 135–145)

## 2019-07-29 LAB — PROTIME-INR
INR: 0.9 (ref 0.8–1.2)
Prothrombin Time: 11.8 seconds (ref 11.4–15.2)

## 2019-07-29 LAB — CBC
HCT: 47.4 % (ref 39.0–52.0)
Hemoglobin: 15.5 g/dL (ref 13.0–17.0)
MCH: 28.2 pg (ref 26.0–34.0)
MCHC: 32.7 g/dL (ref 30.0–36.0)
MCV: 86.3 fL (ref 80.0–100.0)
Platelets: 267 10*3/uL (ref 150–400)
RBC: 5.49 MIL/uL (ref 4.22–5.81)
RDW: 13.2 % (ref 11.5–15.5)
WBC: 8.1 10*3/uL (ref 4.0–10.5)
nRBC: 0 % (ref 0.0–0.2)

## 2019-07-29 LAB — TROPONIN I (HIGH SENSITIVITY)
Troponin I (High Sensitivity): 5 ng/L (ref <18)
Troponin I (High Sensitivity): 5 ng/L (ref <18)

## 2019-07-29 MED ORDER — IOHEXOL 350 MG/ML SOLN
100.0000 mL | Freq: Once | INTRAVENOUS | Status: AC | PRN
  Administered 2019-07-29: 100 mL via INTRAVENOUS

## 2019-07-29 NOTE — ED Provider Notes (Signed)
[facility] EMERGENCY DEPARTMENT Provider Note   CSN: [id number] Arrival date & time: 07/29/19  0306     History   Chief Complaint Chief Complaint  Patient presents with  . Back Pain    HPI Erik Salazar is a 68 y.o. male.     68 yo M with chief complaints of left scapular pain.  Started yesterday.  Feels exactly the same as when he has had a thoracic and or aortic aneurysm before.  Nothing seems to make this better or worse.  Denies chest pain denies shortness of breath denies remedy pain.  The history is provided by the patient.  Back Pain Associated symptoms: no abdominal pain, no chest pain, no fever and no headaches   Illness Severity:  Moderate Onset quality:  Gradual Duration:  2 days Timing:  Constant Progression:  Unchanged Chronicity:  New Associated symptoms: no abdominal pain, no chest pain, no congestion, no diarrhea, no fever, no headaches, no myalgias, no rash, no shortness of breath and no vomiting     Past Medical History:  Diagnosis Date  . Carpal tunnel syndrome on right 07/08/2017  . Cervical radiculopathy at C6 11/14/2017   Right  . Complication of anesthesia   . Enlarged prostate   . H/O: knee surgery    15   . History of open heart surgery    ASCENDING AORTIC ANEURYSM  . 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 PONV (postoperative nausea and vomiting)    only after CABG surgeries  . Seizure disorder ([place]) 04/05/2017  . Seizures ([place])   . Status post knee surgery    DVT POST KNEE SURGERY    Patient Active Problem List   Diagnosis Date Noted  . Cervical radiculopathy at C6 11/14/2017  . Carpal tunnel syndrome on right 07/08/2017  . Tremor, essential 07/08/2017  . 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 Seizure disorder ([place]) 04/05/2017  . Right arm pain 04/05/2017    Past Surgical History:  Procedure Laterality Date  . BALLOON DILATION N/A 06/23/2019   Procedure: BALLOON  DILATION;  Surgeon: Otis Brace, MD;  Location: [facility];  Service: Gastroenterology;  Laterality: N/A;  . BIOPSY  06/23/2019   Procedure: BIOPSY;  Surgeon: Otis Brace, MD;  Location: [facility];  Service: Gastroenterology;;  . CARDIAC SURGERY     ANUERSYM MAY 2017   Bentall procedure. 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 CARPAL TUNNEL RELEASE  08/2018   right hand   . 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 CORONARY ARTERY BYPASS GRAFT  03/13/2017   LIMA to LAD with steril abcess removal from dacron graft  . ESOPHAGOGASTRODUODENOSCOPY (EGD) WITH PROPOFOL N/A 06/23/2019   Procedure: ESOPHAGOGASTRODUODENOSCOPY (EGD) WITH PROPOFOL;  Surgeon: Otis Brace, MD;  Location: [facility] ENDOS[facility];  Service: Gastroenterology;  Laterality: N/A;  . FALSE ANEURYSM REPAIR  03/13/2017   at [facility]   . knee surgeries     13 surgeries on knee done before knee replacement   . KNEE SURGERY  1983  . open heart surgery     03-13-2017  . REPLACEMENT TOTAL KNEE  2015  . ROTATOR CUFF REPAIR          Home Medications    Prior to Admission medications   Medication Sig Start Date End Date Taking? Authorizing Provider  aspirin 81 MG tablet Take 1 tablet (81 mg total) by mouth daily. 05/01/18   Jerline Pain, MD  cholecalciferol (VITAMIN D3) 25 MCG (1000 UT) tablet Take 1,000 Units by mouth daily.    [provider]  levETIRAcetam (KEPPRA) 750 MG tablet TAKE ONE TABLET BY MOUTH TWICE A DAY Patient taking differently: Take 750 mg by mouth 2 (two) times daily. TAKE ONE TABLET BY MOUTH TWICE A DAY 04/30/19   Kathrynn Ducking, MD  metoprolol succinate (TOPROL-XL) 25 MG 24 hr tablet TAKE ONE-HALF TABLET BY MOUTH DAILY WITH OR IMMEDIATELY FOLLOWING A MEAL Patient taking differently: Take 12.5 mg by mouth at bedtime.  08/01/18   Jerline Pain, MD  pantoprazole (PROTONIX) 40 MG tablet  Take 1 tablet (40 mg total) by mouth daily. 06/23/19 06/22/20  Otis Brace, MD  pravastatin (PRAVACHOL) 40 MG tablet TAKE ONE TABLET BY MOUTH EVERY EVENING 07/28/19   Jerline Pain, MD  tamsulosin (FLOMAX) 0.4 MG CAPS capsule Take 0.4 mg by mouth every evening.     [provider]  vitamin C (ASCORBIC ACID) 500 MG tablet Take 500 mg by mouth daily.    [provider]    Family History Family History  Problem Relation Age of Onset  . Aneurysm Mother        brain aneurysm for mother.     Social History Social History   Tobacco Use  . Smoking status: Never Smoker  . Smokeless tobacco: Never Used  Substance Use Topics  . Alcohol use: Yes    Alcohol/week: 1.0 standard drinks    Types: 1 Cans of beer per week    Comment: daily  . Drug use: No     Allergies   Oxycodone hcl, Oxycontin [oxycodone], Percocet [oxycodone-acetaminophen], and Sulfur   Review of Systems Review of Systems  Constitutional: Negative for chills and fever.  HENT: Negative for congestion and facial swelling.   Eyes: Negative for discharge and visual disturbance.  Respiratory: Negative for shortness of breath.   Cardiovascular: Negative for chest pain and palpitations.  Gastrointestinal: Negative for abdominal pain, diarrhea and vomiting.  Musculoskeletal: Positive for back pain. Negative for arthralgias and myalgias.  Skin: Negative for color change and rash.  Neurological: Negative for tremors, syncope and headaches.  Psychiatric/Behavioral: Negative for confusion and dysphoric mood.  68   Physical Exam Updated Vital Signs BP 128/67   Pulse 75   Temp 97.9 F (36.6 C) (Oral)   Resp 20   Ht 5\' 10"  (1.778 m)   Wt 84.8 kg   SpO2 99%   BMI 26.83 kg/m   Physical Exam Vitals signs and nursing note reviewed.  Constitutional:      Appearance: He is well-developed.  HENT:     Head: Normocephalic and atraumatic.  Eyes:     Pupils: Pupils are equal, round, and reactive to  light.  Neck:     Musculoskeletal: Normal range of motion and neck supple.     Vascular: No JVD.  Cardiovascular:     Rate and Rhythm: Normal rate and regular rhythm.     Heart sounds: No murmur. No friction rub. No gallop.   Pulmonary:     Effort: No respiratory distress.     Breath sounds: No wheezing.  Abdominal:     General: There is no distension.     Tenderness: There is no guarding or rebound.  Musculoskeletal: Normal range of motion.        General: Tenderness present.  Comments: Some tenderness about the left trapezius muscle.  Pulses 2+ and intact in the upper and lower extremities.  Skin:    Coloration: Skin is not pale.     Findings: No rash.  Neurological:     Mental Status: He is alert and oriented to person, place, and time.  Psychiatric:        Behavior: Behavior normal.      ED Treatments / Results  Labs (all labs ordered are listed, but only abnormal results are displayed) Labs Reviewed  BASIC METABOLIC PANEL - Abnormal; Notable for the following components:      Result Value   Sodium 134 (*)    Glucose, Bld 111 (*)    All other components within normal limits  CBC  PROTIME-INR  TROPONIN I (HIGH SENSITIVITY)  TROPONIN I (HIGH SENSITIVITY)    EKG EKG Interpretation  Date/Time:  Wednesday July 29 2019 03:20:30 EST Ventricular Rate:  80 PR Interval:  180 QRS Duration: 140 QT Interval:  404 QTC Calculation: 465 R Axis:   76 Text Interpretation: Normal sinus rhythm Right bundle branch block Abnormal ECG No significant change since last tracing Confirmed by Deno Etienne [phone]) on 07/29/2019 7:46:18 AM   Radiology Ct Angio Chest/abd/pel For Dissection W And/or Wo Contrast  Result Date: 07/29/2019 CLINICAL DATA:  History of aortic aneurysm, chest pain radiating into left shoulder EXAM: CT ANGIOGRAPHY CHEST, ABDOMEN AND PELVIS TECHNIQUE: Multidetector CT imaging through the chest, abdomen and pelvis was performed using the standard protocol  during bolus administration of intravenous contrast. Multiplanar reconstructed images and MIPs were obtained and reviewed to evaluate the vascular anatomy. CONTRAST:  131mL OMNIPAQUE IOHEXOL 350 MG/ML SOLN COMPARISON:  CT chest angiogram, 05/05/2018 FINDINGS: CTA CHEST FINDINGS Cardiovascular: Preferential opacification of the thoracic aorta. Redemonstrated postoperative findings of Bentall type aortic valve and root graft repair in addition to CABG. There is minimal atherosclerosis of the native aorta. Normal heart size. No pericardial effusion. Mediastinum/Nodes: No enlarged mediastinal, hilar, or axillary lymph nodes. Thyroid gland, trachea, and esophagus demonstrate no significant findings. Lungs/Pleura: Lungs are clear. No pleural effusion or pneumothorax. Musculoskeletal: No chest wall abnormality. No acute or significant osseous findings. Review of the MIP images confirms the above findings. 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 Incidental note of a small accessory inferior pole right renal artery, which arises anteriorly from the aorta at the same level as the main right renal artery. Accessory inferior pole left renal artery, which arises inferiorly. Otherwise standard branching pattern of the abdominal vasculature. Review of the MIP images confirms the above findings. NON-VASCULAR Hepatobiliary: Hepatic steatosis. Somewhat shrunken and nodular contour of the liver. No gallstones, gallbladder wall thickening, or biliary dilatation. Pancreas: Unremarkable. No pancreatic ductal dilatation or surrounding inflammatory changes. Spleen: Normal in size without significant abnormality. Adrenals/Urinary Tract: Adrenal glands are unremarkable. Nonobstructive left  nephrolithiasis. Left-sided parapelvic cysts. There is an exophytic lesion of the inferior pole of the left kidney measuring approximately 1.5 cm, which appears to have a mixed composition but has internal components that are isodense to renal cortex (approximately 200 HU). Bladder is unremarkable. Stomach/Bowel: Stomach is within normal limits. Appendix appears normal. No evidence of bowel wall thickening, distention, or inflammatory changes. Lymphatic: No enlarged abdominal or pelvic lymph nodes. Reproductive: No mass  or other significant abnormality. Other: No abdominal wall hernia or abnormality. No abdominopelvic ascites. Musculoskeletal: No acute or significant osseous findings. Review of the MIP images confirms the above findings. IMPRESSION: 1. Redemonstrated postoperative findings of Bentall type aortic valve and root graft repair in addition to CABG, unchanged in appearance in comparison to prior examinations. No evidence of dehiscence or other postoperative complication on this nongated examination. Minimal atherosclerosis of the native thoracic aorta. 2. Generally modest atherosclerosis of the abdominal aorta. 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 No prior imaging of the abdomen is available for comparison. 3. Hepatic steatosis. Somewhat shrunken and nodular contour of the liver, suggestive of hepatic cirrhosis. 4. There is an exophytic lesion of the inferior pole of the left kidney measuring approximately 1.5 cm, which appears to have a mixed composition but has internal components that are isodense to renal cortex (approximately 200 HU). This finding is suspicious for a complex cyst with solid component. As above, there is no prior imaging of the abdomen available for comparison. Recommend nonemergent follow-up renal ultrasound to further evaluate for solid lesion, which may further indicate  multiphasic renal protocol CT or MRI to exclude renal cell carcinoma. 5. Nonobstructive left nephrolithiasis. 6.  Aortic Atherosclerosis (ICD10-I70.0). Electronically Signed   By: Eddie Candle M.D.   On: 07/29/2019 08:55    Procedures Procedures (including critical care time)  Medications Ordered in ED Medications  iohexol (OMNIPAQUE) 350 MG/ML injection 100 mL (100 mLs Intravenous Contrast Given 07/29/19 0828)     Initial Impression / Assessment and Plan / ED Course  I have reviewed the triage vital signs and the nursing notes.  Pertinent labs & imaging results that were available during my care of the patient were reviewed by me and considered in my medical decision making (see chart for details).        68 yo M with a cc of left scapular pain.  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  Not hypertensive not tachycardic.  Initial lab work without any significant finding.  Will obtain a CT angiogram of the chest abdomen pelvis.  CT scan without any change to his thoracic aorta repair.  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  He is not having any abdominal or low back pain.  His pain is localized to the trapezius and can be reproduced with palpation.  Seems at this is unlikely to be the cause of his symptoms that brought him here today.  His delta troponin is negative lab work is otherwise unremarkable.  He also has a cystic lesion on his left kidney which I discussed with him and he will discuss this with his family doctor for further imaging.  We will follow-up with CT surgery.  Discharge home.  9:33 AM:  I have discussed the diagnosis/risks/treatment options with the patient and believe the pt to be eligible for discharge home to follow-up with PCP. We also discussed returning to the ED immediately if new or worsening sx occur. We discussed the sx which are most concerning (e.g., sudden worsening pain, fever, inability to tolerate by mouth)  that necessitate immediate return. Medications administered to the patient during their visit and any new prescriptions provided to the patient are listed below.  Medications given during this visit Medications  iohexol (OMNIPAQUE) 350 MG/ML injection 100 mL (100 mLs Intravenous Contrast Given 07/29/19 0828)  The patient appears reasonably screen and/or stabilized for discharge and I doubt any other medical condition or other [facility] requiring further screening, evaluation, or treatment in the ED at this time prior to discharge.    Final Clinical Impressions(s) / ED Diagnoses   Final diagnoses:  Acute left-sided thoracic back pain    ED Discharge Orders    None       Deno Etienne, DO 07/29/19 [id number]

## 2019-07-29 NOTE — ED Triage Notes (Signed)
Pt c/o left scapular pain that began yesterday. States that he has had (2) aneurysms before and this (scapular pain) is how they began. NAD noted. Pt states that the aneurysms were in his heart.

## 2019-07-29 NOTE — ED Notes (Signed)
7/10 pain in scapula-- no changes with breathing/moving.

## 2019-07-29 NOTE — ED Notes (Signed)
Pt up to BR without incident noted.  On phone with family contacts, no acute distress or outward pain responses noted.  Pt slightly agitated, took his seizure meds while in the waiting room and MD aware.

## 2019-07-29 NOTE — Discharge Instructions (Signed)
Your CT scan did not show an issue with your thoracic aorta repair.  There was a new finding in the abdomen which is likely an old tear that is below the kidneys.  Please discuss this with your cardiothoracic surgeon.  They may send you to see a vascular surgeon for this.  There is also a finding in your left kidney where you had a complex cyst.  This is something that your family doctor may want to further evaluate with an MRI, ultrasound or a CT scan that looks specifically at the kidney.  Please return for worsening or change in your symptoms.

## 2019-07-31 ENCOUNTER — Other Ambulatory Visit (HOSPITAL_BASED_OUTPATIENT_CLINIC_OR_DEPARTMENT_OTHER): Payer: Self-pay | Admitting: Family Medicine

## 2019-07-31 ENCOUNTER — Ambulatory Visit (HOSPITAL_BASED_OUTPATIENT_CLINIC_OR_DEPARTMENT_OTHER)
Admission: RE | Admit: 2019-07-31 | Discharge: 2019-07-31 | Disposition: A | Discharge status: Home / Self Care | Payer: Medicare HMO | Source: Ambulatory Visit | Attending: Family Medicine | Admitting: Family Medicine

## 2019-07-31 ENCOUNTER — Other Ambulatory Visit: Payer: Self-pay

## 2019-07-31 DIAGNOSIS — N281 Cyst of kidney, acquired: Secondary | ICD-10-CM | POA: Diagnosis not present

## 2019-07-31 DIAGNOSIS — N2889 Other specified disorders of kidney and ureter: Secondary | ICD-10-CM

## 2019-08-01 ENCOUNTER — Other Ambulatory Visit: Payer: Self-pay | Admitting: Neurology

## 2019-08-13 DIAGNOSIS — R351 Nocturia: Secondary | ICD-10-CM | POA: Diagnosis not present

## 2019-08-13 DIAGNOSIS — N401 Enlarged prostate with lower urinary tract symptoms: Secondary | ICD-10-CM | POA: Diagnosis not present

## 2019-08-13 DIAGNOSIS — N5201 Erectile dysfunction due to arterial insufficiency: Secondary | ICD-10-CM | POA: Diagnosis not present

## 2019-08-13 DIAGNOSIS — D4102 Neoplasm of uncertain behavior of left kidney: Secondary | ICD-10-CM | POA: Diagnosis not present

## 2019-08-24 ENCOUNTER — Other Ambulatory Visit: Payer: Self-pay | Admitting: Cardiology

## 2019-08-29 ENCOUNTER — Other Ambulatory Visit: Payer: Self-pay | Admitting: Cardiology

## 2019-09-03 DIAGNOSIS — D4102 Neoplasm of uncertain behavior of left kidney: Secondary | ICD-10-CM | POA: Diagnosis not present

## 2019-09-03 DIAGNOSIS — N200 Calculus of kidney: Secondary | ICD-10-CM | POA: Diagnosis not present

## 2019-09-08 DIAGNOSIS — N281 Cyst of kidney, acquired: Secondary | ICD-10-CM | POA: Diagnosis not present

## 2019-09-08 DIAGNOSIS — N5201 Erectile dysfunction due to arterial insufficiency: Secondary | ICD-10-CM | POA: Diagnosis not present

## 2019-10-24 ENCOUNTER — Other Ambulatory Visit: Payer: Self-pay | Admitting: Cardiology

## 2019-11-04 ENCOUNTER — Other Ambulatory Visit: Payer: Self-pay | Admitting: Neurology

## 2019-11-06 ENCOUNTER — Encounter: Payer: Self-pay | Admitting: Cardiology

## 2019-11-06 ENCOUNTER — Ambulatory Visit (INDEPENDENT_AMBULATORY_CARE_PROVIDER_SITE_OTHER): Payer: Medicare HMO | Admitting: Cardiology

## 2019-11-06 ENCOUNTER — Other Ambulatory Visit: Payer: Self-pay

## 2019-11-06 ENCOUNTER — Telehealth: Payer: Self-pay | Admitting: *Deleted

## 2019-11-06 VITALS — BP 122/80 | HR 86 | Ht 70.0 in | Wt 220.2 lb

## 2019-11-06 DIAGNOSIS — Z8679 Personal history of other diseases of the circulatory system: Secondary | ICD-10-CM

## 2019-11-06 DIAGNOSIS — I2510 Atherosclerotic heart disease of native coronary artery without angina pectoris: Secondary | ICD-10-CM

## 2019-11-06 DIAGNOSIS — I729 Aneurysm of unspecified site: Secondary | ICD-10-CM

## 2019-11-06 DIAGNOSIS — Z79899 Other long term (current) drug therapy: Secondary | ICD-10-CM | POA: Diagnosis not present

## 2019-11-06 DIAGNOSIS — I2583 Coronary atherosclerosis due to lipid rich plaque: Secondary | ICD-10-CM

## 2019-11-06 DIAGNOSIS — Z951 Presence of aortocoronary bypass graft: Secondary | ICD-10-CM

## 2019-11-06 DIAGNOSIS — Z9889 Other specified postprocedural states: Secondary | ICD-10-CM

## 2019-11-06 LAB — LIPID PANEL
Chol/HDL Ratio: 4.3 ratio (ref 0.0–5.0)
Cholesterol, Total: 200 mg/dL — ABNORMAL HIGH (ref 100–199)
HDL: 47 mg/dL (ref >39)
LDL Chol Calc (NIH): 122 mg/dL — ABNORMAL HIGH (ref 0–99)
Triglycerides: 177 mg/dL — ABNORMAL HIGH (ref 0–149)
VLDL Cholesterol Cal: 31 mg/dL (ref 5–40)

## 2019-11-06 LAB — ALT: ALT: 37 IU/L (ref 0–44)

## 2019-11-06 MED ORDER — EZETIMIBE 10 MG PO TABS: 10.0000 mg | ORAL_TABLET | Freq: Every day | ORAL | 11 refills | Status: DC

## 2019-11-06 NOTE — Telephone Encounter (Signed)
-----   Message from Jerline Pain, MD sent at 11/06/2019  4:51 PM EDT ----- His LDL is 122.  Optimal goal less than 70.  He has had trouble in the past with Crestor and atorvastatin.  He is tolerating his pravastatin. Lets go ahead and add Zetia 10 mg once a day to his regimen.  Repeat lipid panel in 3 months. Candee Furbish, MD

## 2019-11-06 NOTE — Telephone Encounter (Signed)
Pt notified. Will send prescription for Zetia to [facility] on [address].  Pt will come in for fasting lipid profile on June 17,2021

## 2019-11-06 NOTE — Progress Notes (Signed)
Cardiology Office Note:    Date:  11/06/2019   ID:  Erik Salazar, DOB 1950-11-05, MRN [id number]  PCP:  [name]  Cardiologist:  Candee Furbish, MD  Electrophysiologist:  None   Referring MD: [name]     History of Present Illness:    Erik Salazar is a 69 y.o. 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 After moving here, a follow up CTA bu Dr. 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 It measures over 6 cm in length. Patient asked for a second opinion at [facility]. The patient subsequently underwent redo sternotomy in the summer 2018 at [facility]. A 9 cm abscess collection along the graft was found. There is no false aneurysm. Gram stain of this area was negative. The abscess was debrided and the space was filled with omental flap performed at the same time by plastic surgery. Patient also had a repeat coronary graft using left IMA to LAD for a stenotic veingraft from the original operation.  He had a postoperative CVA with expressive aphasia and some swallowing difficulty. He is being followed here by Dr. [name]. He has undergone physical therapy and speech therapy. He has improved and now has a fairly good functional status. He has declined follow-up at [facility] and wishes to be followed [facility] for his aortic cardiac surgery.  Follow up CT shows resolution of abscess (had dye extravasation). ECHO with normal AV function. Plan is to have one year CTA by Dr. [name].   Dr. [name] has given him a disability placard. He indicated that he has trouble locating his car secondary to memory problems. He said he had no problem driving. He was instructed to taper off Keppra.  10/28/17-sinus rhythm 80 with occasional bigeminy noted on his EKG. he is doing well exercising 6 days a week, no syncope, no orthopnea, no chest pain.  He did still  state that he has some numbness in his first 3 finger distribution.  Wearing a brace.  10/28/2018- he is here for follow-up of ascending aneurysm, CAD CABG, stroke.  On 05/05/2018 he was in the emergency department with shortness of breath and chest pain.  Arrived as a daily issue in the substernal region moderate.  CT scan was performed as well as troponin BMP and EKG.  These results were reviewed and he had follow-up with Dr. [name].Marland Kitchen  He reviewed CT images with he and family.  Right hand weakness poor grip.  No symptoms of angina or CHF.  Small ventral hernia noted below xiphoid.  Not impressive on CT scan.  Aortic repair looks fine.  Dr. Amedeo Plenty evaluated hand.  Had carpal tunnel procedure. Gym 5 days weights.  Doing very well without any difficulty.  Using light weights.  Maintaining muscle mass.   11/06/2019-here for follow-up of ascending aortic aneurysm repair.  Back on 07/29/2019 he did go to the emergency room after feeling some left upper back pain.  CT scan was negative for any changes to aneurysm.  Descending aorta infrarenal dissection noted.  Medically managed.  Kidney cyst noted.  No changes.  Overall doing well with his blood pressure control.  He feels well.  His wife unfortunately slipped on water in the garage hit her head and had a brain bleed.  It has been several weeks and she still cannot taste or smell.  This was not related to  Covid.   Past Medical History:  Diagnosis Date  . Carpal tunnel syndrome on right 07/08/2017  . Cervical radiculopathy at C6 11/14/2017   Right  . Complication of anesthesia   . Enlarged prostate   . H/O: knee surgery    15   . History of open heart surgery    ASCENDING AORTIC ANEURYSM  . Ischemic stroke of frontal lobe ([place]) 04/05/2017   Bilateral  . PONV (postoperative nausea and vomiting)    only after CABG surgeries  . Seizure disorder ([place]) 04/05/2017  . Seizures ([place])   . Status post knee surgery    DVT POST KNEE SURGERY    Past Surgical  History:  Procedure Laterality Date  . BALLOON DILATION N/A 06/23/2019   Procedure: BALLOON DILATION;  Surgeon: Otis Brace, MD;  Location: [facility];  Service: Gastroenterology;  Laterality: N/A;  . BIOPSY  06/23/2019   Procedure: BIOPSY;  Surgeon: Otis Brace, MD;  Location: [facility];  Service: Gastroenterology;;  . CARDIAC SURGERY     ANUERSYM MAY 2017   Bentall procedure. 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 CARPAL TUNNEL RELEASE  08/2018   right hand   . 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 CORONARY ARTERY BYPASS GRAFT  03/13/2017   LIMA to LAD with steril abcess removal from dacron graft  . ESOPHAGOGASTRODUODENOSCOPY (EGD) WITH PROPOFOL N/A 06/23/2019   Procedure: ESOPHAGOGASTRODUODENOSCOPY (EGD) WITH PROPOFOL;  Surgeon: Otis Brace, MD;  Location: [facility];  Service: Gastroenterology;  Laterality: N/A;  . FALSE ANEURYSM REPAIR  03/13/2017   at [facility]   . knee surgeries     13 surgeries on knee done before knee replacement   . KNEE SURGERY  1983  . open heart surgery     03-13-2017  . REPLACEMENT TOTAL KNEE  2015  . ROTATOR CUFF REPAIR      Current Medications: Current Meds  Medication Sig  . aspirin 81 MG tablet Take 1 tablet (81 mg total) by mouth daily.  . cholecalciferol (VITAMIN D3) 25 MCG (1000 UT) tablet Take 1,000 Units by mouth daily.  Marland Kitchen levETIRAcetam (KEPPRA) 750 MG tablet TAKE ONE TABLET BY MOUTH TWICE A DAY  . metoprolol succinate (TOPROL-XL) 25 MG 24 hr tablet Take 0.5 tablets (12.5 mg total) by mouth daily. With or immediately following a meal. Please keep upcoming appt with Dr. Marlou Porch. Thank you  . pantoprazole (PROTONIX) 40 MG tablet Take 1 tablet (40 mg total) by mouth daily.  . pravastatin (PRAVACHOL) 40 MG tablet TAKE ONE TABLET BY MOUTH EVERY EVENING  . tamsulosin (FLOMAX) 0.4 MG CAPS capsule Take 0.4 mg by mouth every  evening.   . vitamin C (ASCORBIC ACID) 500 MG tablet Take 500 mg by mouth daily.     Allergies:   Oxycodone hcl, Oxycontin [oxycodone], Percocet [oxycodone-acetaminophen], and Sulfur   Social History   Socioeconomic History  . Marital status: Married    Spouse name: Not on file  . Number of children: Not on file  . Years of education: Not on file  . Highest education level: Not on file  Occupational History  . Occupation: RETIRED  Tobacco Use  . Smoking status: Never Salazar  . Smokeless tobacco: Never Used  Substance and Sexual Activity  . Alcohol use: Yes    Alcohol/week: 1.0 standard drinks    Types: 1 Cans of  beer per week    Comment: daily  . Drug use: No  . Sexual activity: Not on file  Other Topics Concern  . Not on file  Social History Narrative   Lives at home with wife, is retired.  Education: 2 yrs college.   2 Children.   Social Determinants of Health   Financial Resource Strain:   . Difficulty of Paying Living Expenses:   Food Insecurity:   . Worried About Charity fundraiser in the Last Year:   . Arboriculturist in the Last Year:   Transportation Needs:   . Film/video editor (Medical):   Marland Kitchen Lack of Transportation (Non-Medical):   Physical Activity:   . Days of Exercise per Week:   . Minutes of Exercise per Session:   Stress:   . Feeling of Stress :   Social Connections:   . Frequency of Communication with Friends and Family:   . Frequency of Social Gatherings with Friends and Family:   . Attends Religious Services:   . Active Member of Clubs or Organizations:   . Attends Archivist Meetings:   Marland Kitchen Marital Status:      Family History: The patient's family history includes Aneurysm in his mother.  He has 3 daughters.  ROS:   Please see the history of present illness.    No f/c/n/v All other systems reviewed and are negative.  EKGs/Labs/Other Studies Reviewed:    The following studies were reviewed today: Prior office notes lab  work EKG  EKG:  EKG is not ordered today.    Recent Labs: 07/29/2019: BUN 21; Creatinine, Ser 0.99; Hemoglobin 15.5; Platelets 267; Potassium 4.2; Sodium 134  Recent Lipid Panel    Component Value Date/Time   CHOL 199 12/13/2016 0942   TRIG 166 (H) 12/13/2016 0942   HDL 45 12/13/2016 0942   CHOLHDL 4.4 12/13/2016 0942   LDLCALC 121 (H) 12/13/2016 0942    Physical Exam:    VS:  BP 122/80   Pulse 86   Ht 5\' 10"  (1.778 m)   Wt 220 lb 3.2 oz (99.9 kg)   SpO2 96%   BMI 31.60 kg/m     Wt Readings from Last 3 Encounters:  11/06/19 220 lb 3.2 oz (99.9 kg)  07/29/19 187 lb (84.8 kg)  06/23/19 187 lb (84.8 kg)     GEN: Well nourished, well developed, in no acute distress  HEENT: normal  Neck: no JVD, carotid bruits, or masses Cardiac: RRR; no murmurs, rubs, or gallops,no edema  Respiratory:  clear to auscultation bilaterally, normal work of breathing GI: soft, nontender, nondistended, + BS, abdominal scar MS: no deformity or atrophy wearing wrist guards bilaterally Skin: warm and dry, no rash Neuro:  Alert and Oriented x 3, Strength and sensation are intact Psych: euthymic mood, full affect   ASSESSMENT:    1. False aneurysm (HCC)   2. Coronary artery disease due to lipid rich plaque   3. Medication management   4. S/P CABG x 2   5. H/O aortic valve repair    PLAN:    In order of problems listed above:  Ascending aortic aneurysm repair Bentall procedure bioprosthetic aortic valve replacement redo sternotomy secondary to false aneurysm at distal suture line stable - Dr. [name] has been monitoring.  Recent CT unremarkable.  Small ventral hernia noted.  Avoiding Cipro.  Recent EF normal. ER 07/29/2019.  CT scan in ER stable.  Infrarenal distal aortic dissection noted.  Medically managed.  Coronary artery disease status post CABG -  03/13/2017, sterile abscess removed.  SVG from aorta to LAD and SVG to circumflex previously on 01/04/2016.  SVG to OM 2 patent.  No anginal  symptoms.  Continue with aggressive secondary risk factor prevention.  Statin, aspirin, beta-blocker.  Hyperlipidemia -Pravastatin, no changes.  He has had troubles in the past with Crestor atorvastatin other statins.  Leg discomfort.  We will go ahead and check lipid panel today and ALT.  History of stroke -Dr. [name] has been monitoring.  Mild cognitive issues.  No changes made.  1 Year follow-up seems reasonable.   Medication Adjustments/Labs and Tests Ordered: Current medicines are reviewed at length with the patient today.  Concerns regarding medicines are outlined above.  Orders Placed This Encounter  Procedures  . ALT  . Lipid panel   No orders of the defined types were placed in this encounter.   Patient Instructions  Medication Instructions:  The current medical regimen is effective;  continue present plan and medications.  *If you need a refill on your cardiac medications before your next appointment, please call your pharmacy*  Lab Work: Please have blood work today ([place])  If you have labs (blood work) drawn today and your tests are completely normal, you will receive your results only by: Marland Kitchen MyChart Message (if you have MyChart) OR . A paper copy in the mail If you have any lab test that is abnormal or we need to change your treatment, we will call you to review the results.  Follow-Up: At [facility], you and your health needs are our priority.  As part of our continuing mission to provide you with exceptional heart care, we have created designated Provider Care Teams.  These Care Teams include your primary Cardiologist (physician) and Advanced Practice Providers (APPs -  Physician Assistants and Nurse Practitioners) who all work together to provide you with the care you need, when you need it.  We recommend signing up for the patient portal called "MyChart".  Sign up information is provided on this After Visit Summary.  MyChart is used to connect with  patients for Virtual Visits (Telemedicine).  Patients are able to view lab/test results, encounter notes, upcoming appointments, etc.  Non-urgent messages can be sent to your provider as well.   To learn more about what you can do with MyChart, go to [email or web address].    Your next appointment:   12 month(s)  The format for your next appointment:   In Person  Provider:   Candee Furbish, MD   Thank you for choosing [facility]!!        Signed, Candee Furbish, MD  11/06/2019 9:50 AM    [place]

## 2019-11-06 NOTE — Patient Instructions (Addendum)
Medication Instructions:  The current medical regimen is effective;  continue present plan and medications.  *If you need a refill on your cardiac medications before your next appointment, please call your pharmacy*  Lab Work: Please have blood work today ([place])  If you have labs (blood work) drawn today and your tests are completely normal, you will receive your results only by: Marland Kitchen MyChart Message (if you have MyChart) OR . A paper copy in the mail If you have any lab test that is abnormal or we need to change your treatment, we will call you to review the results.  Follow-Up: At [facility], you and your health needs are our priority.  As part of our continuing mission to provide you with exceptional heart care, we have created designated Provider Care Teams.  These Care Teams include your primary Cardiologist (physician) and Advanced Practice Providers (APPs -  Physician Assistants and Nurse Practitioners) who all work together to provide you with the care you need, when you need it.  We recommend signing up for the patient portal called "MyChart".  Sign up information is provided on this After Visit Summary.  MyChart is used to connect with patients for Virtual Visits (Telemedicine).  Patients are able to view lab/test results, encounter notes, upcoming appointments, etc.  Non-urgent messages can be sent to your provider as well.   To learn more about what you can do with MyChart, go to [email or web address].    Your next appointment:   12 month(s)  The format for your next appointment:   In Person  Provider:   Candee Furbish, MD   Thank you for choosing [facility]!!

## 2019-12-02 ENCOUNTER — Other Ambulatory Visit: Payer: Self-pay | Admitting: Cardiology

## 2019-12-16 ENCOUNTER — Ambulatory Visit: Payer: Medicare HMO | Admitting: Neurology

## 2020-01-13 ENCOUNTER — Other Ambulatory Visit: Payer: Self-pay

## 2020-01-13 ENCOUNTER — Ambulatory Visit (INDEPENDENT_AMBULATORY_CARE_PROVIDER_SITE_OTHER): Payer: Medicare HMO | Admitting: Neurology

## 2020-01-13 ENCOUNTER — Encounter: Payer: Self-pay | Admitting: Neurology

## 2020-01-13 VITALS — BP 132/58 | HR 77 | Ht 70.0 in | Wt 199.0 lb

## 2020-01-13 DIAGNOSIS — M5412 Radiculopathy, cervical region: Secondary | ICD-10-CM | POA: Diagnosis not present

## 2020-01-13 DIAGNOSIS — G40909 Epilepsy, unspecified, not intractable, without status epilepticus: Secondary | ICD-10-CM | POA: Diagnosis not present

## 2020-01-13 MED ORDER — LEVETIRACETAM 750 MG PO TABS: 750.0000 mg | ORAL_TABLET | Freq: Two times a day (BID) | ORAL | 1 refills | Status: DC

## 2020-01-13 NOTE — Patient Instructions (Signed)
Continue Keppra  I will refer you to neurosurgery for evaluation  See you back in 6 months

## 2020-01-13 NOTE — Progress Notes (Signed)
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  He had seizures around the time of the strokes, he was placed on Vimpat and Keppra, he was able to come off Vimpat.  Tapering the Keppra resulted in an increase in his essential tremors.  He remains on Keppra 750 mg twice a day, he tried to taper the dose in July 2020, had a seizure while driving.  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  He had carpal tunnel surgery, but continues to have right hand pain, now having left pain and numbness.  When last seen, he was referred for neurosurgical evaluation, he never heard about referral.  He continues to have the numbness and discomfort in the right and left hand.  He has chronic neck pain, has weakness in both hands.  He feels his balance is a little off, but has not fallen, admits he has high expectations for himself, is a former Sports administrator. He does 3 miles on the treadmill daily.  He presents today for evaluation unaccompanied.  HISTORY 12/16/2018 Dr. 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  The patient had seizures around the time of the acute strokes, he was placed on Vimpat and Keppra, he was able to come off of the Vimpat.  Attempts at tapering the Keppra resulted in an increase in his essential tremors, the patient has wanted to stay on the [place].  The patient remains on 750 mg twice daily.  The patient has complained of right hand numbness and pain.  He was found to have right carpal tunnel syndrome and evidence of an overlying right C6  radiculopathy.  MRI of the cervical spine shows neuroforaminal stenosis bilaterally at the C5-6 level.  The patient has undergone carpal tunnel surgery but his right hand pain continues and may have actually increased.  Previously he claimed that turning his head to the right induced discomfort down the right arm, he no longer indicates this.  The patient is having some difficulty sleeping at night at times because of the right hand pain, he does not wish to go on any other medications for the discomfort.  He otherwise has not noted any new medical issues since last seen.  The patient has not had any recurrent seizures.   REVIEW OF SYSTEMS: Out of a complete 14 system review of symptoms, the patient complains only of the following symptoms, and all other reviewed systems are negative.  Seizure, weakness  ALLERGIES: Allergies  Allergen Reactions  . Oxycodone Hcl     DELUSIONAL  . Oxycontin [Oxycodone]     DELUSIONAL   . Percocet [Oxycodone-Acetaminophen]     DELUSIONAL  . Sulfur Rash    HOME MEDICATIONS: Outpatient Medications Prior to Visit  Medication Sig Dispense Refill  . aspirin 81 MG tablet Take 1 tablet (81 mg total) by mouth daily.    . cholecalciferol (VITAMIN D3) 25 MCG (1000 UT) tablet Take 1,000 Units by mouth daily.    . metoprolol succinate (TOPROL-XL) 25 MG 24 hr tablet Take 0.5 tablets (12.5 mg total) by mouth daily. With or immediately following  a meal. 45 tablet 3  . pravastatin (PRAVACHOL) 40 MG tablet TAKE ONE TABLET BY MOUTH EVERY EVENING 90 tablet 0  . Pyridoxine HCl (B-6) 100 MG TABS Take by mouth.    . tamsulosin (FLOMAX) 0.4 MG CAPS capsule Take 0.4 mg by mouth every evening.     . vitamin C (ASCORBIC ACID) 500 MG tablet Take 500 mg by mouth daily.    Marland Kitchen levETIRAcetam (KEPPRA) 750 MG tablet TAKE ONE TABLET BY MOUTH TWICE A DAY 180 tablet 1  . ezetimibe (ZETIA) 10 MG tablet Take 1 tablet (10 mg total) by mouth daily. 30 tablet 11  . pantoprazole (PROTONIX) 40 MG  tablet Take 1 tablet (40 mg total) by mouth daily. 90 tablet 1   No facility-administered medications prior to visit.    PAST MEDICAL HISTORY: Past Medical History:  Diagnosis Date  . Carpal tunnel syndrome on right 07/08/2017  . Cervical radiculopathy at C6 11/14/2017   Right  . Complication of anesthesia   . Enlarged prostate   . H/O: knee surgery    15   . History of open heart surgery    ASCENDING AORTIC ANEURYSM  . Ischemic stroke of frontal lobe ([place]) 04/05/2017   Bilateral  . PONV (postoperative nausea and vomiting)    only after CABG surgeries  . Seizure disorder ([place]) 04/05/2017  . Seizures ([place])   . Status post knee surgery    DVT POST KNEE SURGERY    PAST SURGICAL HISTORY: Past Surgical History:  Procedure Laterality Date  . BALLOON DILATION N/A 06/23/2019   Procedure: BALLOON DILATION;  Surgeon: Otis Brace, MD;  Location: [facility];  Service: Gastroenterology;  Laterality: N/A;  . BIOPSY  06/23/2019   Procedure: BIOPSY;  Surgeon: Otis Brace, MD;  Location: [facility];  Service: Gastroenterology;;  . CARDIAC SURGERY     ANUERSYM MAY 2017   Bentall procedure. 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 CARPAL TUNNEL RELEASE  08/2018   right hand   . 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 CORONARY ARTERY BYPASS GRAFT  03/13/2017   LIMA to LAD with steril abcess removal from dacron graft  . ESOPHAGOGASTRODUODENOSCOPY (EGD) WITH PROPOFOL N/A 06/23/2019   Procedure: ESOPHAGOGASTRODUODENOSCOPY (EGD) WITH PROPOFOL;  Surgeon: Otis Brace, MD;  Location: [facility];  Service: Gastroenterology;  Laterality: N/A;  . FALSE ANEURYSM REPAIR  03/13/2017   at [facility]   . knee surgeries     13 surgeries on knee done before knee replacement   . KNEE SURGERY  1983  . open heart surgery     03-13-2017  . REPLACEMENT TOTAL KNEE  2015  . ROTATOR CUFF REPAIR       FAMILY HISTORY: Family History  Problem Relation Age of Onset  . Aneurysm Mother        brain aneurysm for mother.     SOCIAL HISTORY: Social History   Socioeconomic History  . Marital status: Married    Spouse name: Not on file  . Number of children: Not on file  . Years of education: Not on file  . Highest education level: Not on file  Occupational History  . Occupation: RETIRED  Tobacco Use  . Smoking status: Never Smoker  . Smokeless tobacco: Never Used  Substance and Sexual Activity  . Alcohol use: Yes    Alcohol/week: 1.0 standard drinks  Types: 1 Cans of beer per week    Comment: daily  . Drug use: No  . Sexual activity: Not on file  Other Topics Concern  . Not on file  Social History Narrative   Lives at home with wife, is retired.  Education: 2 yrs college.   2 Children.   Social Determinants of Health   Financial Resource Strain:   . Difficulty of Paying Living Expenses:   Food Insecurity:   . Worried About Charity fundraiser in the Last Year:   . Arboriculturist in the Last Year:   Transportation Needs:   . Film/video editor (Medical):   Marland Kitchen Lack of Transportation (Non-Medical):   Physical Activity:   . Days of Exercise per Week:   . Minutes of Exercise per Session:   Stress:   . Feeling of Stress :   Social Connections:   . Frequency of Communication with Friends and Family:   . Frequency of Social Gatherings with Friends and Family:   . Attends Religious Services:   . Active Member of Clubs or Organizations:   . Attends Archivist Meetings:   Marland Kitchen Marital Status:   Intimate Partner Violence:   . Fear of Current or Ex-Partner:   . Emotionally Abused:   Marland Kitchen Physically Abused:   . Sexually Abused:    PHYSICAL EXAM  Vitals:   01/13/20 0729  BP: (!) 132/58  Pulse: 77  Weight: 199 lb (90.3 kg)  Height: 5\' 10"  (1.778 m)   Body mass index is 28.55 kg/m.  Generalized: Well developed, in no acute distress   Neurological  examination  Mentation: Alert oriented to time, place, history taking. Follows all commands speech and language fluent Cranial nerve II-XII: Pupils were equal round reactive to light. Extraocular movements were full, visual field were full on confrontational test. Facial sensation and strength were normal. Head turning and shoulder shrug  were normal and symmetric. Motor: The motor testing reveals 5 over 5 strength of all 4 extremities. Good symmetric motor tone is noted throughout.  Slight weak handgrip bilaterally. Wearing bilateral wrist braces Sensory: Decreased sensation to soft touch on bilateral upper extremities Coordination: Cerebellar testing reveals good finger-nose-finger and heel-to-shin bilaterally.  Gait and station: Gait is normal. Tandem gait is normal. Romberg is negative. No drift is seen.  Reflexes: Deep tendon reflexes are symmetric and normal bilaterally.   DIAGNOSTIC DATA (LABS, IMAGING, TESTING) - I reviewed patient records, labs, notes, testing and imaging myself where available.  Lab Results  Component Value Date   WBC 8.1 07/29/2019   HGB 15.5 07/29/2019   HCT 47.4 07/29/2019   MCV 86.3 07/29/2019   PLT 267 07/29/2019      Component Value Date/Time   NA 134 (L) 07/29/2019 0331   NA 135 12/13/2016 0942   K 4.2 07/29/2019 0331   CL 99 07/29/2019 0331   CO2 24 07/29/2019 0331   GLUCOSE 111 (H) 07/29/2019 0331   BUN 21 07/29/2019 0331   BUN 16 12/13/2016 0942   CREATININE 0.99 07/29/2019 0331   CALCIUM 9.3 07/29/2019 0331   PROT 7.0 12/13/2016 0942   ALBUMIN 4.5 12/13/2016 0942   AST 13 12/13/2016 0942   ALT 37 11/06/2019 0954   ALKPHOS 98 12/13/2016 0942   BILITOT 0.8 12/13/2016 0942   GFRNONAA >60 07/29/2019 0331   GFRAA >60 07/29/2019 0331   Lab Results  Component Value Date   CHOL 200 (H) 11/06/2019   HDL 47 11/06/2019  [place] 122 (H) 11/06/2019   TRIG 177 (H) 11/06/2019   CHOLHDL 4.3 11/06/2019   No results found for: HGBA1C No results  found for: VITAMINB12 Lab Results  Component Value Date   TSH 1.060 12/13/2016      ASSESSMENT AND PLAN 69 y.o. year old male  has a past medical history of Carpal tunnel syndrome on right (07/08/2017), Cervical radiculopathy at C6 ([id number]), Complication of anesthesia, Enlarged prostate, H/O: knee surgery, History of open heart surgery, Ischemic stroke of frontal lobe ([place]) (04/05/2017), PONV (postoperative nausea and vomiting), Seizure disorder ([place]) (04/05/2017), Seizures ([place]), and Status post knee surgery. here with:  1.  Right C6 radiculopathy 2.  Right carpal tunnel syndrome, status post surgery -Is symptomatic, has numbness and discomfort in the left hand now, as well as the right, may have bilateral C6 impingement -Will send for neurosurgical consultation regarding the C6 radiculopathy -Follow-up in our office in 6 months or sooner if needed  3.  Bifrontal strokes, postsurgical 4.  History of seizures -Recurrent seizure in July 2020, while tapering 750 mg Keppra daily -No recurrent seizure since, continue Keppra 750 mg twice a day  5.  Essential tremors -No longer notices as long as continues Keppra  I spent 30 minutes of face-to-face and non-face-to-face time with patient.  This included previsit chart review, lab review, study review, order entry, electronic health record documentation, patient education.  Butler Denmark, AGNP-C, DNP 01/13/2020, 8:05 AM [facility] [address] [place], [place] [address] [phone]

## 2020-01-13 NOTE — Progress Notes (Signed)
I have read the note, and I agree with the clinical assessment and plan.  Camrie Stock K Sadaf Przybysz   

## 2020-01-26 ENCOUNTER — Other Ambulatory Visit: Payer: Self-pay | Admitting: Neurology

## 2020-01-26 ENCOUNTER — Other Ambulatory Visit: Payer: Self-pay | Admitting: Cardiology

## 2020-01-27 ENCOUNTER — Other Ambulatory Visit: Payer: Self-pay

## 2020-01-27 MED ORDER — LEVETIRACETAM 750 MG PO TABS: 750.0000 mg | ORAL_TABLET | Freq: Two times a day (BID) | ORAL | 1 refills | Status: DC

## 2020-02-02 ENCOUNTER — Telehealth: Payer: Self-pay

## 2020-02-02 ENCOUNTER — Telehealth: Payer: Self-pay | Admitting: Cardiology

## 2020-02-02 ENCOUNTER — Other Ambulatory Visit: Payer: Self-pay

## 2020-02-02 DIAGNOSIS — Z789 Other specified health status: Secondary | ICD-10-CM

## 2020-02-02 DIAGNOSIS — Z79899 Other long term (current) drug therapy: Secondary | ICD-10-CM

## 2020-02-02 DIAGNOSIS — I2510 Atherosclerotic heart disease of native coronary artery without angina pectoris: Secondary | ICD-10-CM

## 2020-02-02 NOTE — Telephone Encounter (Signed)
Called and spoke with pt who is reporting having had leg cramps after starting his most recent medication for cholesterol.  They were so bad he could not sleep at night.  He didn't take it for 2 weeks and the cramps resolved.  He restarted it and had leg cramps again.  The medication was Zetia and was removed from pt's medication list 01/13/2020 by another office.  Advised pt to remain off this medication.  Added to pt's allergy list as an intolerance.

## 2020-02-02 NOTE — Telephone Encounter (Signed)
Pt c/o medication issue:  1. Name of Medication: metoprolol succinate (TOPROL-XL) 25 MG 24 hr tablet / pravastatin (PRAVACHOL) 40 MG tablet  2. How are you currently taking this medication (dosage and times per day)? n/a  3. Are you having a reaction (difficulty breathing--STAT)? Yes  4. What is your medication issue? Patient's wife states that one of these medications gave her husband leg cramps and he had to stop it. She was unsure of which one it was.

## 2020-02-02 NOTE — Telephone Encounter (Signed)
Patient contacted the office asking if it is ok to fly.  [place] Dr. Prescott Gum has been following.  Advised that it is ok for patient to fly.  He acknowledged receipt.

## 2020-02-02 NOTE — Telephone Encounter (Signed)
Started in error

## 2020-02-02 NOTE — Telephone Encounter (Signed)
Wife called by and verified it was Zetia the pt had been taking that caused him leg cramps.

## 2020-02-04 ENCOUNTER — Other Ambulatory Visit: Payer: Self-pay | Admitting: Cardiothoracic Surgery

## 2020-02-04 ENCOUNTER — Other Ambulatory Visit: Payer: Medicare HMO

## 2020-02-04 DIAGNOSIS — I712 Thoracic aortic aneurysm, without rupture, unspecified: Secondary | ICD-10-CM

## 2020-02-04 NOTE — Telephone Encounter (Signed)
Thanks for the update.  Okay to stop Zetia as he is done.  Thank you for adding this to the intolerance list. His LDL is 122.  Goal is less than 70 to help prevent future risk of stroke and heart attack.  Lets have him visit the lipid clinic.  I wonder if he can be a candidate for PCSK9 inhibitor.  Thanks Candee Furbish, MD

## 2020-02-04 NOTE — Telephone Encounter (Signed)
Left message for pt of referral to Lipid Clinic.  Advised he will be receiving a call to be scheduled for this appt which will be here at the [facility] office.

## 2020-03-07 ENCOUNTER — Other Ambulatory Visit: Payer: Self-pay | Admitting: Cardiothoracic Surgery

## 2020-03-09 ENCOUNTER — Other Ambulatory Visit: Payer: Self-pay

## 2020-03-09 ENCOUNTER — Ambulatory Visit
Admission: RE | Admit: 2020-03-09 | Discharge: 2020-03-09 | Disposition: A | Discharge status: Home / Self Care | Payer: Medicare HMO | Source: Ambulatory Visit | Attending: Cardiothoracic Surgery | Admitting: Cardiothoracic Surgery

## 2020-03-09 ENCOUNTER — Telehealth: Payer: Medicare HMO | Admitting: Cardiothoracic Surgery

## 2020-03-09 ENCOUNTER — Telehealth: Payer: Self-pay | Admitting: Cardiothoracic Surgery

## 2020-03-09 DIAGNOSIS — I712 Thoracic aortic aneurysm, without rupture, unspecified: Secondary | ICD-10-CM

## 2020-03-09 MED ORDER — IOPAMIDOL (ISOVUE-370) INJECTION 76%
60.0000 mL | Freq: Once | INTRAVENOUS | Status: AC | PRN
  Administered 2020-03-09: 60 mL via INTRAVENOUS

## 2020-03-09 NOTE — Telephone Encounter (Signed)
[address]       [place] [address]             [phone]     CARDIOTHORACIC SURGERY TELEPHONE VIRTUAL OFFICE NOTE  Referring Provider is No ref. provider found Primary Cardiologist is Candee Furbish, MD PCP is [facility]   HPI:  I spoke with Erik Salazar (DOB Nov 22, 1950 ) via telephone on 03/09/2020 at 5:54 PM and verified that I was speaking with the correct person using more than one form of identification.  We discussed the reason(s) for conducting our visit virtually instead of in-person.  The patient expressed understanding the circumstances and agreed to proceed as described.  I discussed the CTA of the thoracic aorta performed today on this patient.  I personally reviewed the images of his biologic Bentall procedure performed in 2017.  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  The ascending aorta is clear without pseudoaneurysm.  Last echo showed normal function of the bioprosthetic valve.  The arch and descending thoracic aorta showed no abnormality  Patient denies any chest pain or shortness of breath.  He is following a healthy-heart healthy lifestyle. He has numbness in his hands from cervical spine disease and has an appointment to see Dr. Saintclair Halsted of neurosurgery for evaluation.  He understands the importance of blood pressure monitoring and control.  He will be set up for a follow-up CTA to follow his aortic repair in 12 to 18 months.   Current Outpatient Medications  Medication Sig Dispense Refill  . aspirin 81 MG tablet Take 1 tablet (81 mg total) by mouth daily.    . cholecalciferol (VITAMIN D3) 25 MCG (1000 UT) tablet Take 1,000 Units by mouth daily.    Marland Kitchen levETIRAcetam (KEPPRA) 750 MG tablet Take 1 tablet (750 mg total) by mouth 2 (two) times daily. 180 tablet 1  . metoprolol succinate (TOPROL-XL) 25 MG 24 hr tablet Take 0.5 tablets (12.5 mg total) by mouth daily. With or immediately following a  meal. 45 tablet 3  . pravastatin (PRAVACHOL) 40 MG tablet TAKE ONE TABLET BY MOUTH EVERY EVENING 90 tablet 2  . Pyridoxine HCl (B-6) 100 MG TABS Take by mouth.    . tamsulosin (FLOMAX) 0.4 MG CAPS capsule Take 0.4 mg by mouth every evening.     . vitamin C (ASCORBIC ACID) 500 MG tablet Take 500 mg by mouth daily.     No current facility-administered medications for this visit.     Diagnostic Tests:  Intact repair of the aortic root and ascending aorta on today's scan.   Impression:    Plan:  Repeat surveillance scan in 18 months. Continue blood pressure monitoring and control to keep systolic blood pressure less than 140.   I discussed limitations of evaluation and management via telephone.  The patient was advised to call back for repeat telephone consultation or to seek an in-person evaluation if questions arise or the patient's clinical condition changes in any significant manner.  I spent in excess of 5 minutes of non-face-to-face time during the conduct of this telephone virtual office consultation.  Level 1  (99441)             5-10 minutes Level 2  (99442)            11-20 minutes Level 3  (99443)            21-30 minutes  03/09/2020 5:54 PM

## 2020-03-21 ENCOUNTER — Telehealth: Payer: Self-pay | Admitting: *Deleted

## 2020-03-21 NOTE — Telephone Encounter (Signed)
   [facility] Pre-operative Risk Assessment    HEARTCARE STAFF: - Please ensure there is not already an duplicate clearance open for this procedure. - Under Visit Info/Reason for Call, type in Other and utilize the format Clearance MM/DD/YY or Clearance TBD. Do not use dashes or single digits. - If request is for dental extraction, please clarify the # of teeth to be extracted.  Request for surgical clearance:  1. What type of surgery is being performed? C3-4, C4-5, C5-6 ANTERIOR CERVICAL DECOMPRESSION/DISCECTOMY/FUSION   2. When is this surgery scheduled? 04/06/20   3. What type of clearance is required (medical clearance vs. Pharmacy clearance to hold med vs. Both)? MEDICAL  4. Are there any medications that need to be held prior to surgery and how long? ASA   5. Practice name and name of physician performing surgery? [facility]; DR. GARY CRAM   6. What is the office phone number? [phone]   7.   What is the office fax number? [place]: JESSICA  8.   Anesthesia type (None, local, MAC, general) ? GENERAL   Julaine Hua 03/21/2020, 5:41 PM  _________________________________________________________________   (provider comments below)

## 2020-03-22 NOTE — Telephone Encounter (Signed)
   Primary Cardiologist: Candee Furbish, MD  Chart reviewed as part of pre-operative protocol coverage. Patient was contacted 03/22/2020 in reference to pre-operative risk assessment for pending surgery as outlined below.  Erik Salazar was last seen on 11/06/2019 by Dr. Marlou Porch.  Since that day, Erik Salazar has done well without chest pain or shortness of breath. He is able to walk several miles every morning without any issue. Since he is able to accomplish more than 4 METS of activity, no further workup is needed. Last catheterization was performed at [facility] in 2018.   Therefore, based on ACC/AHA guidelines, the patient would be at acceptable risk for the planned procedure without further cardiovascular testing.   I will route this recommendation to the requesting party via Epic fax function and remove from pre-op pool. Please call with questions.  Will forward to Dr. Marlou Porch to make sure he is ok with holding aspirin for 7 days prior to neck surgery.   [place], [place] 03/22/2020, 2:55 PM

## 2020-03-23 NOTE — Telephone Encounter (Signed)
Okay with holding aspirin 7 days prior to neck surgery. Candee Furbish, MD

## 2020-03-23 NOTE — Telephone Encounter (Signed)
Faxed to Dr. Windy Carina office

## 2020-04-05 ENCOUNTER — Other Ambulatory Visit (HOSPITAL_COMMUNITY)
Admission: RE | Admit: 2020-04-05 | Discharge: 2020-04-05 | Disposition: A | Discharge status: Home / Self Care | Payer: Medicare HMO | Source: Ambulatory Visit | Attending: Neurosurgery | Admitting: Neurosurgery

## 2020-04-05 DIAGNOSIS — Z20822 Contact with and (suspected) exposure to covid-19: Secondary | ICD-10-CM | POA: Diagnosis not present

## 2020-04-05 DIAGNOSIS — Z01812 Encounter for preprocedural laboratory examination: Secondary | ICD-10-CM | POA: Insufficient documentation

## 2020-04-05 LAB — SARS CORONAVIRUS 2 (TAT 6-24 HRS): SARS Coronavirus 2: NEGATIVE

## 2020-04-06 ENCOUNTER — Ambulatory Visit (HOSPITAL_COMMUNITY): Admission: RE | Admit: 2020-04-06 | Payer: Medicare HMO | Source: Home / Self Care | Admitting: Neurosurgery

## 2020-04-06 ENCOUNTER — Encounter (HOSPITAL_COMMUNITY): Admission: RE | Payer: Self-pay | Source: Home / Self Care

## 2020-04-06 SURGERY — ANTERIOR CERVICAL DECOMPRESSION/DISCECTOMY FUSION 3 LEVELS
Anesthesia: General

## 2020-04-22 ENCOUNTER — Other Ambulatory Visit: Payer: Self-pay | Admitting: Neurosurgery

## 2020-04-29 NOTE — Progress Notes (Signed)
Your procedure is scheduled on Wednesday, September 15th.  Report to [facility] Main Entrance "A" at 9:05 A.M., and check in at the Admitting office.  Call this number if you have problems the morning of surgery:  [phone]  Call [phone] if you have any questions prior to your surgery date Monday-Friday 8am-4pm   Remember:  Do not eat or drink after midnight the night before your surgery    Take these medicines the morning of surgery with A SIP OF WATER  levETIRAcetam (KEPPRA)   If needed: acetaminophen (TYLENOL)   Follow your surgeon's instructions on when to stop Aspirin.  If no instructions were given by your surgeon then you will need to call the office to get those instructions.    As of today, STOP taking Aleve, Naproxen, Ibuprofen, Motrin, Advil, Goody's, BC's, all herbal medications, fish oil, and all vitamins.                     Do not wear jewelry.            Do not wear lotions, powders, colognes, or deodorant.            Men may shave face and neck.            Do not bring valuables to the hospital.            [facility] is not responsible for any belongings or valuables.  Do NOT Smoke (Tobacco/Vaping) or drink Alcohol 24 hours prior to your procedure If you use a CPAP at night, you may bring all equipment for your overnight stay.   Contacts, glasses, dentures or bridgework may not be worn into surgery.      For patients admitted to the hospital, discharge time will be determined by your treatment team.   Patients discharged the day of surgery will not be allowed to drive home, and someone needs to stay with them for 24 hours.  Special instructions:   [facility]- Preparing For Surgery  Before surgery, you can play an important role. Because skin is not sterile, your skin needs to be as free of germs as possible. You can reduce the number of germs on your skin by washing with CHG (chlorahexidine gluconate) Soap before surgery.  CHG is an antiseptic cleaner  which kills germs and bonds with the skin to continue killing germs even after washing.    Oral Hygiene is also important to reduce your risk of infection.  Remember - BRUSH YOUR TEETH THE MORNING OF SURGERY WITH YOUR REGULAR TOOTHPASTE  Please do not use if you have an allergy to CHG or antibacterial soaps. If your skin becomes reddened/irritated stop using the CHG.  Do not shave (including legs and underarms) for at least 48 hours prior to first CHG shower. It is OK to shave your face.  Please follow these instructions carefully.   1. Shower the NIGHT BEFORE SURGERY and the MORNING OF SURGERY with CHG Soap.   2. If you chose to wash your hair, wash your hair first as usual with your normal shampoo.  3. After you shampoo, rinse your hair and body thoroughly to remove the shampoo.  4. Use CHG as you would any other liquid soap. You can apply CHG directly to the skin and wash gently with a scrungie or a clean washcloth.   5. Apply the CHG Soap to your body ONLY FROM THE NECK DOWN.  Do not use on open wounds or open  sores. Avoid contact with your eyes, ears, mouth and genitals (private parts). Wash Face and genitals (private parts)  with your normal soap.   6. Wash thoroughly, paying special attention to the area where your surgery will be performed.  7. Thoroughly rinse your body with warm water from the neck down.  8. DO NOT shower/wash with your normal soap after using and rinsing off the CHG Soap.  9. Pat yourself dry with a CLEAN TOWEL.  10. Wear CLEAN PAJAMAS to bed the night before surgery  11. Place CLEAN SHEETS on your bed the night of your first shower and DO NOT SLEEP WITH PETS.  Day of Surgery: Wear Clean/Comfortable clothing the morning of surgery Do not apply any deodorants/lotions.   Remember to brush your teeth WITH YOUR REGULAR TOOTHPASTE.   Please read over the following fact sheets that you were given.

## 2020-05-02 ENCOUNTER — Other Ambulatory Visit (HOSPITAL_COMMUNITY)
Admission: RE | Admit: 2020-05-02 | Discharge: 2020-05-02 | Disposition: A | Discharge status: Home / Self Care | Payer: Medicare HMO | Source: Ambulatory Visit | Attending: Neurosurgery | Admitting: Neurosurgery

## 2020-05-02 ENCOUNTER — Other Ambulatory Visit: Payer: Self-pay

## 2020-05-02 ENCOUNTER — Encounter (HOSPITAL_COMMUNITY): Payer: Self-pay

## 2020-05-02 ENCOUNTER — Encounter (HOSPITAL_COMMUNITY)
Admission: RE | Admit: 2020-05-02 | Discharge: 2020-05-02 | Disposition: A | Discharge status: Home / Self Care | Payer: Medicare HMO | Source: Ambulatory Visit | Attending: Neurosurgery | Admitting: Neurosurgery

## 2020-05-02 DIAGNOSIS — Z96659 Presence of unspecified artificial knee joint: Secondary | ICD-10-CM | POA: Insufficient documentation

## 2020-05-02 DIAGNOSIS — G40909 Epilepsy, unspecified, not intractable, without status epilepticus: Secondary | ICD-10-CM | POA: Insufficient documentation

## 2020-05-02 DIAGNOSIS — Z20822 Contact with and (suspected) exposure to covid-19: Secondary | ICD-10-CM | POA: Insufficient documentation

## 2020-05-02 DIAGNOSIS — M5412 Radiculopathy, cervical region: Secondary | ICD-10-CM | POA: Insufficient documentation

## 2020-05-02 DIAGNOSIS — I2510 Atherosclerotic heart disease of native coronary artery without angina pectoris: Secondary | ICD-10-CM | POA: Diagnosis not present

## 2020-05-02 DIAGNOSIS — Z01812 Encounter for preprocedural laboratory examination: Secondary | ICD-10-CM | POA: Insufficient documentation

## 2020-05-02 DIAGNOSIS — Z951 Presence of aortocoronary bypass graft: Secondary | ICD-10-CM | POA: Insufficient documentation

## 2020-05-02 DIAGNOSIS — Z79899 Other long term (current) drug therapy: Secondary | ICD-10-CM | POA: Insufficient documentation

## 2020-05-02 DIAGNOSIS — Z953 Presence of xenogenic heart valve: Secondary | ICD-10-CM | POA: Diagnosis not present

## 2020-05-02 DIAGNOSIS — N400 Enlarged prostate without lower urinary tract symptoms: Secondary | ICD-10-CM | POA: Diagnosis not present

## 2020-05-02 DIAGNOSIS — Z86718 Personal history of other venous thrombosis and embolism: Secondary | ICD-10-CM | POA: Insufficient documentation

## 2020-05-02 DIAGNOSIS — Z8673 Personal history of transient ischemic attack (TIA), and cerebral infarction without residual deficits: Secondary | ICD-10-CM | POA: Diagnosis not present

## 2020-05-02 DIAGNOSIS — Z7982 Long term (current) use of aspirin: Secondary | ICD-10-CM | POA: Diagnosis not present

## 2020-05-02 HISTORY — DX: Atherosclerotic heart disease of native coronary artery without angina pectoris: I25.10

## 2020-05-02 LAB — TYPE AND SCREEN
ABO/RH(D): O POS
Antibody Screen: NEGATIVE

## 2020-05-02 LAB — CBC
HCT: 48.5 % (ref 39.0–52.0)
Hemoglobin: 15.7 g/dL (ref 13.0–17.0)
MCH: 28.4 pg (ref 26.0–34.0)
MCHC: 32.4 g/dL (ref 30.0–36.0)
MCV: 87.9 fL (ref 80.0–100.0)
Platelets: 232 10*3/uL (ref 150–400)
RBC: 5.52 MIL/uL (ref 4.22–5.81)
RDW: 13.4 % (ref 11.5–15.5)
WBC: 9.6 10*3/uL (ref 4.0–10.5)
nRBC: 0 % (ref 0.0–0.2)

## 2020-05-02 LAB — BASIC METABOLIC PANEL
Anion gap: 12 (ref 5–15)
BUN: 14 mg/dL (ref 8–23)
CO2: 21 mmol/L — ABNORMAL LOW (ref 22–32)
Calcium: 9.2 mg/dL (ref 8.9–10.3)
Chloride: 100 mmol/L (ref 98–111)
Creatinine, Ser: 0.97 mg/dL (ref 0.61–1.24)
GFR calc Af Amer: >60 mL/min (ref >60)
GFR calc non Af Amer: >60 mL/min (ref >60)
Glucose, Bld: 97 mg/dL (ref 70–99)
Potassium: 4.3 mmol/L (ref 3.5–5.1)
Sodium: 133 mmol/L — ABNORMAL LOW (ref 135–145)

## 2020-05-02 LAB — SARS CORONAVIRUS 2 (TAT 6-24 HRS): SARS Coronavirus 2: NEGATIVE

## 2020-05-02 LAB — SURGICAL PCR SCREEN
MRSA, PCR: NEGATIVE
Staphylococcus aureus: NEGATIVE

## 2020-05-02 NOTE — Progress Notes (Signed)
Anesthesia Chart Review:  Case: [id number] Date/Time: 05/04/20 1053   Procedure: ACDF - C3-C4 - C4-C5 - C5-C6 (N/A ) - 3C   Anesthesia type: General   Pre-op diagnosis: Stenosis   Location: [place] OR ROOM 14 / [place] [facility]   Surgeons: Kary Kos, MD      DISCUSSION: Patient is a 69 year old male scheduled for the above procedure.  History includes former smoker, postoperative N/V, CAD/Ascending TAA (s/p Bentall with 23 mm pericardial AVR with CABGx2: SVG to LAD and CX Marg[place] due to issue with LM coronary button 01/04/16 [facility] in [place], Dr. Matthias Hughs; s/p redo median sternotomy, sterile abscess removal from Dacron graft, omental flap around aorta, CABGx1: LIMA-LAD on 03/13/17 [name], Dr. 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 Notes indicate that he is a former [employer].  Preoperative cardiac input outlined on 03/22/2020 by Almyra Deforest, PA, "... Erik Salazar was last seen on 11/06/2019 by Dr. Marlou Porch.  Since that day, Erik Salazar has done well without chest pain or shortness of breath. He is able to walk several miles every morning without any issue. Since he is able to accomplish more than 4 METS of activity, no further workup is needed. Last catheterization was performed at [facility] in 2018.   Therefore, based on ACC/AHA guidelines, the patient would be at acceptable risk for the planned procedure without further cardiovascular testing." Dr. Marlou Porch added that patient can hold ASA for 7 days prior to surgery.   05/02/2020 presurgical COVID-19 test is in process.  Anesthesia team to evaluate on the day of surgery.   VS: BP (!) 142/67   Pulse 74   Temp 36.7 C (Oral)   Resp 17   Ht 5\' 10"  (1.778 m)   Wt 92 kg   SpO2 98%   BMI 29.10 kg/m     PROVIDERS: [place], [facility] is PCP [facility]) -  Candee Furbish, MD is cardiologist - Prescott Gum, Collier Salina, MD is CT surgeon. Last evaluation 03/09/20.  Good blood pressure control discussed.  Plan for follow-up surveillance chest CTA in 12-18 months. Kathrynn Ducking, MD is neurologist. Last evaluation by Butler Denmark, NP on 01/13/20. No recurrent seizure since 02/2019 on Keppra 750 mg BID.   LABS: Labs reviewed: Acceptable for surgery. (all labs ordered are listed, but only abnormal results are displayed)  Labs Reviewed  BASIC METABOLIC PANEL - Abnormal; Notable for the following components:      Result Value   Sodium 133 (*)    CO2 21 (*)    All other components within normal limits  SURGICAL PCR SCREEN  CBC  TYPE AND SCREEN     IMAGES: MRI L-spine 03/30/20 [facility]): IMPRESSION:  - L5-S1: Bilateral pars defects with 7 mm of anterolisthesis. Pseudo  disc herniation. Bilateral foraminal stenosis could compress either  or both exiting L5 nerves.  - L3-4: Mild multifactorial spinal stenosis because of bulging of the  disc in combination with mild facet and ligamentous prominence. No  visible neural compression however.  - Disc bulges and facet degeneration at L2-3 and L4-5 but no apparent  neural compressive stenosis.   CTA Chest 03/09/20: IMPRESSION: 1. Stable postsurgical changes in the chest. The aortic graft is patent without complicating features. Negative for aortic dissection. 2. No acute abnormality in the chest. 3. Hyperdense cyst in left kidney upper pole. This  is similar to the prior abdominal CT and probably represents a hemorrhagic or proteinaceous cyst.  MRI C-spine 03/03/20 [facility]): IMPRESSION:  Degenerative cervical spondylosis and facet arthritis. No  compressive canal stenosis. Left foraminal stenosis at C3-4 that  could affect the left C4 nerve. Bilateral foraminal stenosis at C5-6  that could affect either C6 nerve.     EKG: 07/29/19: NSR, right BBB   CV: Echo 09/24/17: Study Conclusions  - Left  ventricle: The cavity size was normal. There was mild focal  basal hypertrophy of the septum. Systolic function was normal.  The estimated ejection fraction was in the range of 60% to 65%.  Wall motion was normal; there were no regional wall motion  abnormalities. Doppler parameters are equivocal for elevated mean  left atrial filling pressure.  - Ventricular septum: Septal motion showed paradox. These changes  are consistent with a post-thoracotomy state.  - Aortic valve: A bioprosthetic prosthesis was present and  functioning normally.  - Mitral valve: Calcified annulus. Mildly thickened leaflets .  Systolic bowing without prolapse.  - Left atrium: The atrium was mildly dilated.  - Pulmonary arteries: Systolic pressure was mildly increased. PA  peak pressure: 34 mm Hg (S).    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 Mart Piggs)   [place] 01/02/16 [facility]; prior to [place]): LMCA: Normal LAD: Moderate caliber artery with mild luminal irregularities. LCX: Moderate caliber artery with mild luminal irregularities. RCA: Moderate caliber artery with mild luminal irregularities.   Past Medical History:  Diagnosis Date  . Carpal tunnel syndrome on right 07/08/2017  . Cervical radiculopathy at C6 11/14/2017   Right  . Complication of anesthesia   . Coronary artery disease   . Enlarged prostate   . H/O: knee  surgery    15   . History of open heart surgery    ASCENDING AORTIC ANEURYSM  . Ischemic stroke of frontal lobe ([place]) 03/14/2017   Bilateral; post-redo CT surgery  . PONV (postoperative nausea and vomiting)    only after CABG surgeries  . Seizure disorder ([place]) 03/14/2017  . Seizures ([place])   . Status post knee surgery    DVT POST KNEE SURGERY    Past Surgical History:  Procedure Laterality Date  . BALLOON DILATION N/A 06/23/2019   Procedure: BALLOON DILATION;  Surgeon: Otis Brace, MD;  Location: [facility];  Service: Gastroenterology;  Laterality: N/A;  . BENTALL PROCEDURE  01/04/2016   Bentall with 23 mm pericardial AVR; SVG-LAD, SVG-CX ([place])  . BIOPSY  06/23/2019   Procedure: BIOPSY;  Surgeon: Otis Brace, MD;  Location: [facility];  Service: Gastroenterology;;  . CARDIAC SURGERY     ANUERSYM MAY 2017   Bentall procedure. 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 CARPAL TUNNEL RELEASE  08/2018   right hand   . CORONARY ARTERY BYPASS GRAFT  01/04/2016   VG to LAD & VG to LCX  . CORONARY ARTERY BYPASS GRAFT  03/13/2017   LIMA to LAD with steril abcess removal from dacron graft  . ESOPHAGOGASTRODUODENOSCOPY (EGD) WITH PROPOFOL N/A 06/23/2019   Procedure: ESOPHAGOGASTRODUODENOSCOPY (EGD) WITH PROPOFOL;  Surgeon: Otis Brace, MD;  Location: [facility];  Service: Gastroenterology;  Laterality: N/A;  . FALSE ANEURYSM REPAIR  03/13/2017   redo sternotomy, sterile abscess removal from Dacron graft, omental flap around aorta, CABG: LIMA-LAD ([name], Dr. Mart Piggs)  . knee surgeries     13 surgeries on knee done before knee replacement   . KNEE SURGERY  1983  . open heart surgery     03-13-2017  . REPLACEMENT TOTAL KNEE  2015  . ROTATOR CUFF REPAIR      MEDICATIONS: . acetaminophen (TYLENOL) 325 MG tablet  . ascorbic acid (VITAMIN C) 500 MG tablet  .  aspirin EC 81 MG tablet  . Cholecalciferol (VITAMIN D3) 250 MCG (10000 UT) TABS  . levETIRAcetam (KEPPRA) 750 MG tablet  . metoprolol succinate (TOPROL-XL) 25 MG 24 hr tablet  . pravastatin (PRAVACHOL) 40 MG tablet  . tamsulosin (FLOMAX) 0.4 MG CAPS capsule   No current facility-administered medications for this encounter.   ASA is on hold for surgery.   Myra Gianotti, PA-C Surgical Short Stay/Anesthesiology [facility] Phone [phone] [facility] Phone [phone] 05/02/2020 4:27 PM

## 2020-05-02 NOTE — Progress Notes (Signed)
PCP:  [facility] Cardiologist: Candee Furbish, MD  EKG:  07/29/19 CXR:  N/A ECHO:  10/04/17 Stress Test:  Denies Cardiac Cath:  01/02/16  Covid test 05/02/20  Anesthesia Review:  Yes, history of CABG.  Patient denies shortness of breath, fever, cough, and chest pain at PAT appointment.  Patient verbalized understanding of instructions provided today at the PAT appointment.  Patient asked to review instructions at home and day of surgery.

## 2020-05-02 NOTE — Anesthesia Preprocedure Evaluation (Addendum)
Anesthesia Evaluation  Patient identified by MRN, date of birth, ID band Patient awake    Reviewed: Allergy & Precautions, NPO status , Patient's Chart, lab work & pertinent test results, reviewed documented beta blocker date and time   History of Anesthesia Complications (+) PONV  Airway Mallampati: II  TM Distance: >3 FB Neck ROM: Full    Dental  (+) Dental Advisory Given   Pulmonary former smoker,  05/02/2020 SARS coronavirus NEG   breath sounds clear to auscultation       Cardiovascular hypertension, Pt. on medications and Pt. on home beta blockers (-) angina+ CAD ('17 cath: mild, non-obstructive luminal irreg) and + Peripheral Vascular Disease (s/p Bentall asc aorta graft)  + Valvular Problems/Murmurs (s/p Bentall AVR)  Rhythm:Regular Rate:Normal  Echo 09/24/17: Study Conclusions  - Left ventricle: The cavity size was normal. There was mild focal  basal hypertrophy of the septum. Systolic function was normal.  The estimated ejection fraction was in the range of 60% to 65%.  Wall motion was normal; there were no regional wall motion  abnormalities. Doppler parameters are equivocal for elevated mean  left atrial filling pressure.  - Ventricular septum: Septal motion showed paradox. These changes  are consistent with a post-thoracotomy state.  - Aortic valve: A bioprosthetic prosthesis was present and  functioning normally.  - Mitral valve: Calcified annulus. Mildly thickened leaflets .  Systolic bowing without prolapse.  - Left atrium: The atrium was mildly dilated.  - Pulmonary arteries: Systolic pressure was mildly increased. PA  peak pressure: 34 mm Hg (S).     Neuro/Psych Seizures -, Well Controlled,  CVA (frontal lobe)    GI/Hepatic negative GI ROS, Neg liver ROS,   Endo/Other  negative endocrine ROS  Renal/GU negative Renal ROS     Musculoskeletal   Abdominal   Peds  Hematology negative  hematology ROS (+)   Anesthesia Other Findings   Reproductive/Obstetrics                           Anesthesia Physical Anesthesia Plan  ASA: III  Anesthesia Plan: General   Post-op Pain Management:    Induction: Intravenous  PONV Risk Score and Plan: 3 and Ondansetron, Dexamethasone and Droperidol  Airway Management Planned: Oral ETT and Video Laryngoscope Planned  Additional Equipment: None  Intra-op Plan:   Post-operative Plan: Extubation in OR  Informed Consent: I have reviewed the patients History and Physical, chart, labs and discussed the procedure including the risks, benefits and alternatives for the proposed anesthesia with the patient or authorized representative who has indicated his/her understanding and acceptance.     Dental advisory given  Plan Discussed with: CRNA and Surgeon  Anesthesia Plan Comments: (PAT note written 05/02/2020 by Myra Gianotti, PA-C. )      Anesthesia Quick Evaluation

## 2020-05-04 ENCOUNTER — Other Ambulatory Visit: Payer: Self-pay

## 2020-05-04 ENCOUNTER — Encounter (HOSPITAL_COMMUNITY): Payer: Self-pay | Admitting: Neurosurgery

## 2020-05-04 ENCOUNTER — Ambulatory Visit (HOSPITAL_COMMUNITY)
Admission: RE | Admit: 2020-05-04 | Discharge: 2020-05-05 | Disposition: A | Discharge status: Home / Self Care | Payer: Medicare HMO | Attending: Neurosurgery | Admitting: Neurosurgery

## 2020-05-04 ENCOUNTER — Ambulatory Visit (HOSPITAL_COMMUNITY): Payer: Medicare HMO | Admitting: Emergency Medicine

## 2020-05-04 ENCOUNTER — Ambulatory Visit (HOSPITAL_COMMUNITY): Payer: Medicare HMO

## 2020-05-04 ENCOUNTER — Ambulatory Visit (HOSPITAL_COMMUNITY)
Admission: RE | Disposition: A | Discharge status: Home / Self Care | Payer: Self-pay | Source: Home / Self Care | Attending: Neurosurgery

## 2020-05-04 DIAGNOSIS — Z953 Presence of xenogenic heart valve: Secondary | ICD-10-CM | POA: Diagnosis not present

## 2020-05-04 DIAGNOSIS — Z951 Presence of aortocoronary bypass graft: Secondary | ICD-10-CM | POA: Diagnosis not present

## 2020-05-04 DIAGNOSIS — M4722 Other spondylosis with radiculopathy, cervical region: Secondary | ICD-10-CM | POA: Insufficient documentation

## 2020-05-04 DIAGNOSIS — Z882 Allergy status to sulfonamides status: Secondary | ICD-10-CM | POA: Insufficient documentation

## 2020-05-04 DIAGNOSIS — Z96659 Presence of unspecified artificial knee joint: Secondary | ICD-10-CM | POA: Insufficient documentation

## 2020-05-04 DIAGNOSIS — Z885 Allergy status to narcotic agent status: Secondary | ICD-10-CM | POA: Diagnosis not present

## 2020-05-04 DIAGNOSIS — I10 Essential (primary) hypertension: Secondary | ICD-10-CM | POA: Diagnosis not present

## 2020-05-04 DIAGNOSIS — G40909 Epilepsy, unspecified, not intractable, without status epilepticus: Secondary | ICD-10-CM | POA: Insufficient documentation

## 2020-05-04 DIAGNOSIS — M4802 Spinal stenosis, cervical region: Secondary | ICD-10-CM | POA: Diagnosis not present

## 2020-05-04 DIAGNOSIS — Z888 Allergy status to other drugs, medicaments and biological substances status: Secondary | ICD-10-CM | POA: Insufficient documentation

## 2020-05-04 DIAGNOSIS — Z87891 Personal history of nicotine dependence: Secondary | ICD-10-CM | POA: Insufficient documentation

## 2020-05-04 DIAGNOSIS — I739 Peripheral vascular disease, unspecified: Secondary | ICD-10-CM | POA: Diagnosis not present

## 2020-05-04 DIAGNOSIS — Z79899 Other long term (current) drug therapy: Secondary | ICD-10-CM | POA: Insufficient documentation

## 2020-05-04 DIAGNOSIS — Z8249 Family history of ischemic heart disease and other diseases of the circulatory system: Secondary | ICD-10-CM | POA: Insufficient documentation

## 2020-05-04 DIAGNOSIS — I2510 Atherosclerotic heart disease of native coronary artery without angina pectoris: Secondary | ICD-10-CM | POA: Diagnosis not present

## 2020-05-04 DIAGNOSIS — N400 Enlarged prostate without lower urinary tract symptoms: Secondary | ICD-10-CM | POA: Insufficient documentation

## 2020-05-04 DIAGNOSIS — Z8673 Personal history of transient ischemic attack (TIA), and cerebral infarction without residual deficits: Secondary | ICD-10-CM | POA: Diagnosis not present

## 2020-05-04 DIAGNOSIS — Z7982 Long term (current) use of aspirin: Secondary | ICD-10-CM | POA: Diagnosis not present

## 2020-05-04 DIAGNOSIS — Z86718 Personal history of other venous thrombosis and embolism: Secondary | ICD-10-CM | POA: Insufficient documentation

## 2020-05-04 DIAGNOSIS — Z419 Encounter for procedure for purposes other than remedying health state, unspecified: Secondary | ICD-10-CM

## 2020-05-04 HISTORY — PX: ANTERIOR CERVICAL DECOMP/DISCECTOMY FUSION: SHX1161

## 2020-05-04 LAB — ABO/RH: ABO/RH(D): O POS

## 2020-05-04 SURGERY — ANTERIOR CERVICAL DECOMPRESSION/DISCECTOMY FUSION 3 LEVELS
Anesthesia: General | Site: Neck

## 2020-05-04 MED ORDER — TAMSULOSIN HCL 0.4 MG PO CAPS
0.4000 mg | ORAL_CAPSULE | Freq: Every evening | ORAL | Status: DC
  Administered 2020-05-04: 0.4 mg via ORAL
  Filled 2020-05-04: qty 1

## 2020-05-04 MED ORDER — DEXAMETHASONE SODIUM PHOSPHATE 10 MG/ML IJ SOLN
10.0000 mg | Freq: Once | INTRAMUSCULAR | Status: AC
  Administered 2020-05-04: 10 mg via INTRAVENOUS

## 2020-05-04 MED ORDER — SCOPOLAMINE 1 MG/3DAYS TD PT72
MEDICATED_PATCH | TRANSDERMAL | Status: AC
  Administered 2020-05-04: 1.5 mg via TRANSDERMAL
  Filled 2020-05-04: qty 1

## 2020-05-04 MED ORDER — PROPOFOL 500 MG/50ML IV EMUL
INTRAVENOUS | Status: DC | PRN
  Administered 2020-05-04: 25 ug/kg/min via INTRAVENOUS

## 2020-05-04 MED ORDER — HYDROMORPHONE HCL 1 MG/ML IJ SOLN: 0.5000 mg | INTRAMUSCULAR | Status: DC | PRN

## 2020-05-04 MED ORDER — THROMBIN 5000 UNITS EX SOLR
CUTANEOUS | Status: AC
  Filled 2020-05-04: qty 5000

## 2020-05-04 MED ORDER — CHLORHEXIDINE GLUCONATE CLOTH 2 % EX PADS: 6.0000 | MEDICATED_PAD | Freq: Once | CUTANEOUS | Status: DC

## 2020-05-04 MED ORDER — FENTANYL CITRATE (PF) 250 MCG/5ML IJ SOLN
INTRAMUSCULAR | Status: DC | PRN
Start: 2020-05-04 — End: 2020-05-04
  Administered 2020-05-04: 250 ug via INTRAVENOUS

## 2020-05-04 MED ORDER — ASPIRIN EC 81 MG PO TBEC
81.0000 mg | DELAYED_RELEASE_TABLET | Freq: Every day | ORAL | Status: DC
  Filled 2020-05-04: qty 1

## 2020-05-04 MED ORDER — ASCORBIC ACID 500 MG PO TABS
500.0000 mg | ORAL_TABLET | Freq: Every day | ORAL | Status: DC
  Filled 2020-05-04: qty 1

## 2020-05-04 MED ORDER — 0.9 % SODIUM CHLORIDE (POUR BTL) OPTIME
TOPICAL | Status: DC | PRN
  Administered 2020-05-04: 1000 mL

## 2020-05-04 MED ORDER — ONDANSETRON HCL 4 MG/2ML IJ SOLN
INTRAMUSCULAR | Status: AC
  Filled 2020-05-04: qty 4

## 2020-05-04 MED ORDER — ACETAMINOPHEN 500 MG PO TABS: 1000.0000 mg | ORAL_TABLET | Freq: Once | ORAL | Status: AC

## 2020-05-04 MED ORDER — ROCURONIUM BROMIDE 10 MG/ML (PF) SYRINGE
PREFILLED_SYRINGE | INTRAVENOUS | Status: DC | PRN
  Administered 2020-05-04: 40 mg via INTRAVENOUS
  Administered 2020-05-04: 60 mg via INTRAVENOUS
  Administered 2020-05-04: 30 mg via INTRAVENOUS

## 2020-05-04 MED ORDER — HYDROMORPHONE HCL 1 MG/ML IJ SOLN
INTRAMUSCULAR | Status: AC
  Filled 2020-05-04: qty 1

## 2020-05-04 MED ORDER — THROMBIN 5000 UNITS EX SOLR
OROMUCOSAL | Status: DC | PRN
  Administered 2020-05-04: 5 mL via TOPICAL

## 2020-05-04 MED ORDER — PANTOPRAZOLE SODIUM 40 MG IV SOLR: 40.0000 mg | Freq: Every day | INTRAVENOUS | Status: DC

## 2020-05-04 MED ORDER — HYDROCODONE-ACETAMINOPHEN 5-325 MG PO TABS
2.0000 | ORAL_TABLET | ORAL | Status: DC | PRN
  Filled 2020-05-04: qty 2

## 2020-05-04 MED ORDER — CEFAZOLIN SODIUM-DEXTROSE 2-4 GM/100ML-% IV SOLN
2.0000 g | Freq: Three times a day (TID) | INTRAVENOUS | Status: AC
  Administered 2020-05-04 – 2020-05-05 (×2): 2 g via INTRAVENOUS
  Filled 2020-05-04 (×2): qty 100

## 2020-05-04 MED ORDER — ROCURONIUM BROMIDE 10 MG/ML (PF) SYRINGE
PREFILLED_SYRINGE | INTRAVENOUS | Status: AC
  Filled 2020-05-04: qty 10

## 2020-05-04 MED ORDER — PRAVASTATIN SODIUM 40 MG PO TABS
40.0000 mg | ORAL_TABLET | Freq: Every evening | ORAL | Status: DC
  Administered 2020-05-04: 40 mg via ORAL
  Filled 2020-05-04: qty 1

## 2020-05-04 MED ORDER — ACETAMINOPHEN 325 MG PO TABS
650.0000 mg | ORAL_TABLET | ORAL | Status: DC | PRN
  Administered 2020-05-04 – 2020-05-05 (×4): 650 mg via ORAL
  Filled 2020-05-04 (×4): qty 2

## 2020-05-04 MED ORDER — PROPOFOL 10 MG/ML IV BOLUS
INTRAVENOUS | Status: AC
  Filled 2020-05-04: qty 20

## 2020-05-04 MED ORDER — CYCLOBENZAPRINE HCL 10 MG PO TABS
10.0000 mg | ORAL_TABLET | Freq: Three times a day (TID) | ORAL | Status: DC | PRN
  Administered 2020-05-04 – 2020-05-05 (×2): 10 mg via ORAL
  Filled 2020-05-04 (×2): qty 1

## 2020-05-04 MED ORDER — MIDAZOLAM HCL 2 MG/2ML IJ SOLN
INTRAMUSCULAR | Status: AC
  Filled 2020-05-04: qty 2

## 2020-05-04 MED ORDER — THROMBIN 20000 UNITS EX SOLR
CUTANEOUS | Status: AC
  Filled 2020-05-04: qty 20000

## 2020-05-04 MED ORDER — THROMBIN 20000 UNITS EX SOLR: CUTANEOUS | Status: DC | PRN

## 2020-05-04 MED ORDER — FENTANYL CITRATE (PF) 250 MCG/5ML IJ SOLN
INTRAMUSCULAR | Status: AC
  Filled 2020-05-04: qty 5

## 2020-05-04 MED ORDER — CHLORHEXIDINE GLUCONATE 0.12 % MT SOLN
OROMUCOSAL | Status: AC
  Administered 2020-05-04: 15 mL via OROMUCOSAL
  Filled 2020-05-04: qty 15

## 2020-05-04 MED ORDER — MEPERIDINE HCL 25 MG/ML IJ SOLN: 6.2500 mg | INTRAMUSCULAR | Status: DC | PRN

## 2020-05-04 MED ORDER — SODIUM CHLORIDE 0.9% FLUSH: 3.0000 mL | INTRAVENOUS | Status: DC | PRN

## 2020-05-04 MED ORDER — ALUM & MAG HYDROXIDE-SIMETH 200-200-20 MG/5ML PO SUSP: 30.0000 mL | Freq: Four times a day (QID) | ORAL | Status: DC | PRN

## 2020-05-04 MED ORDER — LEVETIRACETAM 250 MG PO TABS
750.0000 mg | ORAL_TABLET | Freq: Two times a day (BID) | ORAL | Status: DC
  Administered 2020-05-04 – 2020-05-05 (×2): 750 mg via ORAL
  Filled 2020-05-04 (×2): qty 3

## 2020-05-04 MED ORDER — LACTATED RINGERS IV SOLN: INTRAVENOUS | Status: DC

## 2020-05-04 MED ORDER — PHENOL 1.4 % MT LIQD
1.0000 | OROMUCOSAL | Status: DC | PRN
  Filled 2020-05-04: qty 177

## 2020-05-04 MED ORDER — SODIUM CHLORIDE 0.9% FLUSH
3.0000 mL | Freq: Two times a day (BID) | INTRAVENOUS | Status: DC
  Administered 2020-05-04: 3 mL via INTRAVENOUS

## 2020-05-04 MED ORDER — PANTOPRAZOLE SODIUM 40 MG PO TBEC
40.0000 mg | DELAYED_RELEASE_TABLET | Freq: Every day | ORAL | Status: DC
  Administered 2020-05-04: 40 mg via ORAL
  Filled 2020-05-04: qty 1

## 2020-05-04 MED ORDER — PROMETHAZINE HCL 25 MG/ML IJ SOLN: 6.2500 mg | INTRAMUSCULAR | Status: DC | PRN

## 2020-05-04 MED ORDER — ONDANSETRON HCL 4 MG PO TABS: 4.0000 mg | ORAL_TABLET | Freq: Four times a day (QID) | ORAL | Status: DC | PRN

## 2020-05-04 MED ORDER — PHENYLEPHRINE HCL (PRESSORS) 10 MG/ML IV SOLN
INTRAVENOUS | Status: DC | PRN
  Administered 2020-05-04: 120 ug via INTRAVENOUS

## 2020-05-04 MED ORDER — ACETAMINOPHEN 650 MG RE SUPP: 650.0000 mg | RECTAL | Status: DC | PRN

## 2020-05-04 MED ORDER — MIDAZOLAM HCL 2 MG/2ML IJ SOLN
INTRAMUSCULAR | Status: DC | PRN
  Administered 2020-05-04 (×2): 1 mg via INTRAVENOUS

## 2020-05-04 MED ORDER — ACETAMINOPHEN 325 MG PO TABS: 650.0000 mg | ORAL_TABLET | Freq: Four times a day (QID) | ORAL | Status: DC | PRN

## 2020-05-04 MED ORDER — SCOPOLAMINE 1 MG/3DAYS TD PT72
1.0000 | MEDICATED_PATCH | TRANSDERMAL | Status: DC
  Filled 2020-05-04: qty 1

## 2020-05-04 MED ORDER — PROPOFOL 10 MG/ML IV BOLUS
INTRAVENOUS | Status: DC | PRN
  Administered 2020-05-04: 130 mg via INTRAVENOUS

## 2020-05-04 MED ORDER — ONDANSETRON HCL 4 MG/2ML IJ SOLN
INTRAMUSCULAR | Status: DC | PRN
  Administered 2020-05-04: 4 mg via INTRAVENOUS

## 2020-05-04 MED ORDER — HYDROMORPHONE HCL 1 MG/ML IJ SOLN
0.2500 mg | INTRAMUSCULAR | Status: DC | PRN
  Administered 2020-05-04 (×2): 0.5 mg via INTRAVENOUS

## 2020-05-04 MED ORDER — ORAL CARE MOUTH RINSE: 15.0000 mL | Freq: Once | OROMUCOSAL | Status: AC

## 2020-05-04 MED ORDER — EPHEDRINE SULFATE-NACL 50-0.9 MG/10ML-% IV SOSY
PREFILLED_SYRINGE | INTRAVENOUS | Status: DC | PRN
  Administered 2020-05-04: 5 mg via INTRAVENOUS

## 2020-05-04 MED ORDER — CEFAZOLIN SODIUM-DEXTROSE 2-4 GM/100ML-% IV SOLN
2.0000 g | INTRAVENOUS | Status: AC
  Administered 2020-05-04: 2 g via INTRAVENOUS

## 2020-05-04 MED ORDER — ACETAMINOPHEN 500 MG PO TABS
ORAL_TABLET | ORAL | Status: AC
Start: 2020-05-04 — End: 2020-05-04
  Administered 2020-05-04: 1000 mg via ORAL
  Filled 2020-05-04: qty 2

## 2020-05-04 MED ORDER — NON FORMULARY
Status: DC | PRN
  Administered 2020-05-04: 500 mL via SURGICAL_CAVITY

## 2020-05-04 MED ORDER — CHLORHEXIDINE GLUCONATE 0.12 % MT SOLN: 15.0000 mL | Freq: Once | OROMUCOSAL | Status: AC

## 2020-05-04 MED ORDER — VITAMIN D 25 MCG (1000 UNIT) PO TABS
10000.0000 [IU] | ORAL_TABLET | Freq: Every day | ORAL | Status: DC
  Filled 2020-05-04 (×2): qty 10

## 2020-05-04 MED ORDER — SODIUM CHLORIDE 0.9 % IV SOLN: 250.0000 mL | INTRAVENOUS | Status: DC

## 2020-05-04 MED ORDER — LIDOCAINE 2% (20 MG/ML) 5 ML SYRINGE
INTRAMUSCULAR | Status: DC | PRN
  Administered 2020-05-04: 40 mg via INTRAVENOUS

## 2020-05-04 MED ORDER — ONDANSETRON HCL 4 MG/2ML IJ SOLN: 4.0000 mg | Freq: Four times a day (QID) | INTRAMUSCULAR | Status: DC | PRN

## 2020-05-04 MED ORDER — METOPROLOL SUCCINATE ER 25 MG PO TB24
12.5000 mg | ORAL_TABLET | Freq: Every day | ORAL | Status: DC
  Administered 2020-05-04: 12.5 mg via ORAL
  Filled 2020-05-04: qty 1

## 2020-05-04 MED ORDER — PHENYLEPHRINE HCL-NACL 10-0.9 MG/250ML-% IV SOLN
INTRAVENOUS | Status: DC | PRN
  Administered 2020-05-04: 15 ug/min via INTRAVENOUS

## 2020-05-04 MED ORDER — MENTHOL 3 MG MT LOZG
1.0000 | LOZENGE | OROMUCOSAL | Status: DC | PRN
  Filled 2020-05-04: qty 9

## 2020-05-04 MED ORDER — SUGAMMADEX SODIUM 200 MG/2ML IV SOLN
INTRAVENOUS | Status: DC | PRN
  Administered 2020-05-04: 200 mg via INTRAVENOUS

## 2020-05-04 MED ORDER — MIDAZOLAM HCL 2 MG/2ML IJ SOLN: 0.5000 mg | Freq: Once | INTRAMUSCULAR | Status: DC | PRN

## 2020-05-04 SURGICAL SUPPLY — 72 items
BAG DECANTER FOR FLEXI CONT (MISCELLANEOUS) ×2 IMPLANT
BAND RUBBER #18 3X1/16 STRL (MISCELLANEOUS) ×4 IMPLANT
BASKET BONE COLLECTION (BASKET) ×2 IMPLANT
BENZOIN TINCTURE PRP APPL 2/3 (GAUZE/BANDAGES/DRESSINGS) ×2 IMPLANT
BIT DRILL 13 (BIT) ×2 IMPLANT
BIT DRILL NEURO 2X3.1 SFT TUCH (MISCELLANEOUS) ×1 IMPLANT
BUR MATCHSTICK NEURO 3.0 LAGG (BURR) ×2 IMPLANT
CANISTER SUCT 3000ML PPV (MISCELLANEOUS) ×2 IMPLANT
CARTRIDGE OIL MAESTRO DRILL (MISCELLANEOUS) ×1 IMPLANT
COLLAR CERV LO CONTOUR FIRM DE (SOFTGOODS) ×2 IMPLANT
COVER WAND RF STERILE (DRAPES) IMPLANT
DECANTER SPIKE VIAL GLASS SM (MISCELLANEOUS) IMPLANT
DERMABOND ADVANCED (GAUZE/BANDAGES/DRESSINGS) ×1
DERMABOND ADVANCED .7 DNX12 (GAUZE/BANDAGES/DRESSINGS) ×1 IMPLANT
DIFFUSER DRILL AIR PNEUMATIC (MISCELLANEOUS) ×2 IMPLANT
DRAIN JACKSON PRATT 10MM FLAT (MISCELLANEOUS) ×2 IMPLANT
DRAPE C-ARM 42X72 X-RAY (DRAPES) ×4 IMPLANT
DRAPE LAPAROTOMY 100X72 PEDS (DRAPES) ×2 IMPLANT
DRAPE MICROSCOPE LEICA (MISCELLANEOUS) ×2 IMPLANT
DRILL NEURO 2X3.1 SOFT TOUCH (MISCELLANEOUS) ×2
DRSG OPSITE POSTOP 4X6 (GAUZE/BANDAGES/DRESSINGS) ×2 IMPLANT
DURAPREP 6ML APPLICATOR 50/CS (WOUND CARE) ×2 IMPLANT
ELECT COATED BLADE 2.86 ST (ELECTRODE) ×2 IMPLANT
ELECT REM PT RETURN 9FT ADLT (ELECTROSURGICAL) ×2
ELECTRODE REM PT RTRN 9FT ADLT (ELECTROSURGICAL) ×1 IMPLANT
EVACUATOR SILICONE 100CC (DRAIN) ×2 IMPLANT
GAUZE 4X4 16PLY RFD (DISPOSABLE) IMPLANT
GAUZE SPONGE 4X4 12PLY STRL (GAUZE/BANDAGES/DRESSINGS) ×2 IMPLANT
GLOVE BIO SURGEON STRL SZ 6.5 (GLOVE) ×8 IMPLANT
GLOVE BIO SURGEON STRL SZ7 (GLOVE) ×2 IMPLANT
GLOVE BIO SURGEON STRL SZ8 (GLOVE) ×2 IMPLANT
GLOVE BIOGEL PI IND STRL 6.5 (GLOVE) ×3 IMPLANT
GLOVE BIOGEL PI IND STRL 7.0 (GLOVE) ×1 IMPLANT
GLOVE BIOGEL PI IND STRL 7.5 (GLOVE) ×1 IMPLANT
GLOVE BIOGEL PI INDICATOR 6.5 (GLOVE) ×3
GLOVE BIOGEL PI INDICATOR 7.0 (GLOVE) ×1
GLOVE BIOGEL PI INDICATOR 7.5 (GLOVE) ×1
GLOVE ECLIPSE 6.5 STRL STRAW (GLOVE) ×2 IMPLANT
GLOVE EXAM NITRILE XL STR (GLOVE) IMPLANT
GLOVE INDICATOR 8.5 STRL (GLOVE) ×2 IMPLANT
GLOVE SURG SS PI 6.0 STRL IVOR (GLOVE) ×4 IMPLANT
GOWN STRL REUS W/ TWL LRG LVL3 (GOWN DISPOSABLE) ×4 IMPLANT
GOWN STRL REUS W/ TWL XL LVL3 (GOWN DISPOSABLE) ×1 IMPLANT
GOWN STRL REUS W/TWL 2XL LVL3 (GOWN DISPOSABLE) IMPLANT
GOWN STRL REUS W/TWL LRG LVL3 (GOWN DISPOSABLE) ×4
GOWN STRL REUS W/TWL XL LVL3 (GOWN DISPOSABLE) ×1
HALTER HD/CHIN CERV TRACTION D (MISCELLANEOUS) ×2 IMPLANT
HEMOSTAT POWDER KIT SURGIFOAM (HEMOSTASIS) ×4 IMPLANT
KIT BASIN OR (CUSTOM PROCEDURE TRAY) ×2 IMPLANT
KIT TURNOVER KIT B (KITS) ×2 IMPLANT
NEEDLE SPNL 20GX3.5 QUINCKE YW (NEEDLE) ×2 IMPLANT
NS IRRIG 1000ML POUR BTL (IV SOLUTION) ×2 IMPLANT
OIL CARTRIDGE MAESTRO DRILL (MISCELLANEOUS) ×2
PACK LAMINECTOMY NEURO (CUSTOM PROCEDURE TRAY) ×2 IMPLANT
PIN DISTRACTION 14MM (PIN) ×4 IMPLANT
PLATE 3 60XNS SPNE CVD ANT T (Plate) ×1 IMPLANT
PLATE 3 ATLANTIS TRANS (Plate) ×1 IMPLANT
PUTTY BONE DBX 2.5 MIS (Bone Implant) ×2 IMPLANT
SCREW ST 15X4XST FXANG NS (Screw) ×7 IMPLANT
SCREW ST FIX 4 ATL (Screw) ×7 IMPLANT
SCREW ST FIX 4 ATL 3120213 (Screw) ×2 IMPLANT
SPACER HEDRON C 12X14X6 0D (Spacer) ×2 IMPLANT
SPACER HEDRON C 12X14X7 0D (Spacer) ×4 IMPLANT
SPONGE INTESTINAL PEANUT (DISPOSABLE) ×2 IMPLANT
SPONGE SURGIFOAM ABS GEL 100 (HEMOSTASIS) ×2 IMPLANT
STRIP CLOSURE SKIN 1/2X4 (GAUZE/BANDAGES/DRESSINGS) ×2 IMPLANT
SUT VIC AB 3-0 SH 8-18 (SUTURE) ×4 IMPLANT
SUT VIC AB 4-0 PS2 27 (SUTURE) ×2 IMPLANT
TAPE CLOTH 4X10 WHT NS (GAUZE/BANDAGES/DRESSINGS) IMPLANT
TOWEL GREEN STERILE (TOWEL DISPOSABLE) ×2 IMPLANT
TOWEL GREEN STERILE FF (TOWEL DISPOSABLE) ×2 IMPLANT
WATER STERILE IRR 1000ML POUR (IV SOLUTION) ×2 IMPLANT

## 2020-05-04 NOTE — Transfer of Care (Signed)
Immediate Anesthesia Transfer of Care Note  Patient: Erik Salazar  Procedure(s) Performed: Anterior Cervical Decompression/Discectomy Fuion Cervical three-four, Cervical four-five, Cervical five-six (N/A Neck)  Patient Location: PACU  Anesthesia Type:General  Level of Consciousness: sedated, drowsy and patient cooperative  Airway & Oxygen Therapy: Patient Spontanous Breathing and Patient connected to face mask  Post-op Assessment: Report given to RN  Post vital signs: Reviewed and stable  Last Vitals:  Vitals Value Taken Time  BP 152/76 05/04/20 1351  Temp    Pulse 82 05/04/20 1353  Resp 19 05/04/20 1353  SpO2 100 % 05/04/20 1353  Vitals shown include unvalidated device data.  Last Pain:  Vitals:   05/04/20 0919  PainSc: 7       Patients Stated Pain Goal: 2 (62/82/41 7530)  Complications: No complications documented.

## 2020-05-04 NOTE — Op Note (Signed)
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  Anterior cervical plating with the Atlantis translational plating system  Surgeon: Dominica Severin Shyloh Derosa  Assistant: Ashok Pall  Anesthesia: General  EBL: Minimal  HPI: 69 year old gentleman is a progressive worsening neck bilateral shoulder and arm pain worse on the left.  Work-up revealed severe foraminal stenosis C3-4 and central stenosis with cord compression C4-5 and C5-6.  Due to patient's progressive clinical syndrome imaging findings and failed conservative treatment I recommended anterior cervical discectomies and fusion at those 3 levels.  I extensively went over the risks and benefits of that operation with him as well as perioperative course expectations of outcome and alternatives of surgery and he understood and agreed to proceed forward.  Operative procedure: Patient was brought into the OR was due to general anesthesia positioned supine the neck in slight extension 5 pounds halter traction the rest of his neck was prepped and draped in routine sterile fashion.  Preoperative x-ray localized the appropriate level so a curvilinear incision was made just off the midline to the anterior border of the sternocleidomastoid and superficial layer platysma was dissected out divided longitudinally.  The avascular plane between the sternocleidomastoid and strap muscles was developed down to the prevertebral fascia and prevertebral fascia was dissected with Kitners.  Intraoperative x-ray confirmed identification appropriate level.  So annulotomy's were made with a 15 blade scalpel to marked the disc base longus was reflected laterally and self-retaining retractors were placed.  Anterior osteophytes  were removed at all 3 levels all 3 levels were then drilled down capturing the bone shavings and mucus trap and under microscopic lamination first working at C3-4 further drilling down and under biting of the endplates allowed indication of posterior longitudinal ligament which was removed in piecemeal fashion.  Extensive mount of posterior endplate spur was removed as well as a lot of uncinate hypertrophy which was also removed both C4 pedicles identified both C4 nerve roots are decompressed flush the pedicle.  After adequate decompression been achieved centrally and foraminally this was packed with Gelfoam tension taken at C5-6.  At C5-6 further drilling down again rule out removal of the posterior longitudinal ligament a lot of posterior spurring was aggressively removed by underbite in both endplates of both vertebral bodies marching laterally both C6 pedicles were identified and both C6 nerve roots were skeletonized and decompressed flush the pedicle.  After adequate decompression achieved here he was packed with Gelfoam 10-second C4-5.  In a similar fashion C4-5 was decompressed pathology was all posterior central and foraminal primarily on the left at C5 [place] this was removed decompressing both C5 nerve roots and central canal.  I then sized up implants submillimeter graft at 3 4 and 4 5 6  mm 5 6.  All implants went in 1 to 2 mm deep to the anterior to byline then used Surgifoam for hemostasis and packs an additional bone graft around the implants and underneath the plate and selected a 28mm lance translational plate all screws had excellent purchase and locking mechanism was engaged.  Postop fluoroscopy confirmed good position of the implants.  Wounds and copiously irrigated to Kassim states was maintained I placed a JP drain and then closed the wound in layers with Vicryl and a running 4 subcuticular Dermabond benzoin Steri-Strips and a sterile  dressing was applied patient recovery room in stable condition.   At the end the case all needle count sponge counts were correct.

## 2020-05-04 NOTE — Anesthesia Postprocedure Evaluation (Signed)
Anesthesia Post Note  Patient: Jaret Votaw  Procedure(s) Performed: Anterior Cervical Decompression/Discectomy Fuion Cervical three-four, Cervical four-five, Cervical five-six (N/A Neck)     Patient location during evaluation: PACU Anesthesia Type: General Level of consciousness: awake and alert, patient cooperative and oriented Pain management: pain level controlled Vital Signs Assessment: post-procedure vital signs reviewed and stable Respiratory status: spontaneous breathing, nonlabored ventilation and respiratory function stable Cardiovascular status: blood pressure returned to baseline and stable Postop Assessment: no apparent nausea or vomiting Anesthetic complications: no   No complications documented.  Last Vitals:  Vitals:   05/04/20 1445 05/04/20 1452  BP:  (!) 154/74  Pulse: 82 79  Resp: 14 14  Temp:    SpO2: 93% 96%    Last Pain:  Vitals:   05/04/20 1430  PainSc: 7                  Zofia Peckinpaugh,E. Merri Dimaano

## 2020-05-04 NOTE — Anesthesia Procedure Notes (Signed)
Procedure Name: Intubation Date/Time: 05/04/2020 11:08 AM Performed by: Colin Benton, CRNA Pre-anesthesia Checklist: Patient identified, Emergency Drugs available, Suction available and Patient being monitored Patient Re-evaluated:Patient Re-evaluated prior to induction Oxygen Delivery Method: Circle System Utilized Preoxygenation: Pre-oxygenation with 100% oxygen Induction Type: IV induction Ventilation: Mask ventilation without difficulty Laryngoscope Size: Glidescope Grade View: Grade I Tube type: Oral Tube size: 7.5 mm Number of attempts: 1 Airway Equipment and Method: Stylet and Oral airway Placement Confirmation: ETT inserted through vocal cords under direct vision,  positive ETCO2 and breath sounds checked- equal and bilateral Tube secured with: Tape Dental Injury: Teeth and Oropharynx as per pre-operative assessment  Comments: Performed by Darrin Nipper

## 2020-05-04 NOTE — H&P (Signed)
Erik Salazar is an 69 y.o. male.   Chief Complaint: Neck pain left greater than right arm pain HPI: 66-year gentleman with progressive worsening neck pain bilateral shoulder and arm pain worse on the left work-up revealed severe cervical spondylosis spinal stenosis degenerative disease C3-4 C4-5 C5-6 with severe foraminal stenosis on the left at C3-4 bilaterally at C4-5 and C5-6.  Due to patient progression of clinical syndrome failed conservative treatment imaging findings I recommended anterior cervical discectomies and fusion at those 3 levels.  I extensively reviewed the risks and benefits of the operation with him as well as perioperative course expectations of outcome and alternatives of surgery and he understood and agreed to proceed forward.  Past Medical History:  Diagnosis Date  . Carpal tunnel syndrome on right 07/08/2017  . Cervical radiculopathy at C6 11/14/2017   Right  . Complication of anesthesia   . Coronary artery disease   . Enlarged prostate   . H/O: knee surgery    15   . History of open heart surgery    ASCENDING AORTIC ANEURYSM  . Ischemic stroke of frontal lobe ([place]) 03/14/2017   Bilateral; post-redo CT surgery  . PONV (postoperative nausea and vomiting)    only after CABG surgeries  . Seizure disorder ([place]) 03/14/2017  . Seizures ([place])   . Status post knee surgery    DVT POST KNEE SURGERY    Past Surgical History:  Procedure Laterality Date  . BALLOON DILATION N/A 06/23/2019   Procedure: BALLOON DILATION;  Surgeon: Otis Brace, MD;  Location: [facility];  Service: Gastroenterology;  Laterality: N/A;  . BENTALL PROCEDURE  01/04/2016   Bentall with 23 mm pericardial AVR; SVG-LAD, SVG-CX ([place])  . BIOPSY  06/23/2019   Procedure: BIOPSY;  Surgeon: Otis Brace, MD;  Location: [facility];  Service: Gastroenterology;;  . CARDIAC SURGERY     ANUERSYM MAY 2017   Bentall procedure. Bioprosthetic aortic valve #23 mm bovine model  #2700 TF ask, and 28 mm Gelweave woven vascular sinus of Valsalva graft  . CARPAL TUNNEL RELEASE  08/2018   right hand   . CORONARY ARTERY BYPASS GRAFT  01/04/2016   VG to LAD & VG to LCX  . CORONARY ARTERY BYPASS GRAFT  03/13/2017   LIMA to LAD with steril abcess removal from dacron graft  . ESOPHAGOGASTRODUODENOSCOPY (EGD) WITH PROPOFOL N/A 06/23/2019   Procedure: ESOPHAGOGASTRODUODENOSCOPY (EGD) WITH PROPOFOL;  Surgeon: Otis Brace, MD;  Location: [facility];  Service: Gastroenterology;  Laterality: N/A;  . FALSE ANEURYSM REPAIR  03/13/2017   redo sternotomy, sterile abscess removal from Dacron graft, omental flap around aorta, CABG: LIMA-LAD ([name], Dr. Mart Piggs)  . knee surgeries     13 surgeries on knee done before knee replacement   . KNEE SURGERY  1983  . open heart surgery     03-13-2017  . REPLACEMENT TOTAL KNEE  2015  . ROTATOR CUFF REPAIR      Family History  Problem Relation Age of Onset  . Aneurysm Mother        brain aneurysm for mother.    Social History:  reports that he has quit smoking. His smoking use included cigars. He has never used smokeless tobacco. He reports current alcohol use of about 1.0 standard drink of alcohol per week. He reports that he does not use drugs.  Allergies:  Allergies  Allergen Reactions  . Zetia [Ezetimibe] Other (See Comments)    Leg cramps  . Oxycodone Hcl  DELUSIONAL  . Oxycontin [Oxycodone]     DELUSIONAL   . Percocet [Oxycodone-Acetaminophen]     DELUSIONAL  . Sulfur Rash    Medications Prior to Admission  Medication Sig Dispense Refill  . acetaminophen (TYLENOL) 325 MG tablet Take 650 mg by mouth every 6 (six) hours as needed for moderate pain or headache.    Marland Kitchen ascorbic acid (VITAMIN C) 500 MG tablet Take 500 mg by mouth daily.    Marland Kitchen aspirin EC 81 MG tablet Take 81 mg by mouth daily. Swallow whole.    . Cholecalciferol (VITAMIN D3) 250 MCG (10000 UT) TABS Take 10,000 Units by mouth daily.    Marland Kitchen  levETIRAcetam (KEPPRA) 750 MG tablet Take 1 tablet (750 mg total) by mouth 2 (two) times daily. 180 tablet 1  . metoprolol succinate (TOPROL-XL) 25 MG 24 hr tablet Take 0.5 tablets (12.5 mg total) by mouth daily. With or immediately following a meal. (Patient taking differently: Take 12.5 mg by mouth at bedtime. ) 45 tablet 3  . pravastatin (PRAVACHOL) 40 MG tablet TAKE ONE TABLET BY MOUTH EVERY EVENING (Patient taking differently: Take 40 mg by mouth every evening. ) 90 tablet 2  . tamsulosin (FLOMAX) 0.4 MG CAPS capsule Take 0.4 mg by mouth every evening.       Results for orders placed or performed during the hospital encounter of 05/04/20 (from the past 48 hour(s))  ABO/Rh     Status: None (Preliminary result)   Collection Time: 05/04/20  9:25 AM  Result Value Ref Range   ABO/RH(D) PENDING    No results found.  Review of Systems  Musculoskeletal: Positive for neck pain.  Neurological: Positive for numbness.    Blood pressure (!) 180/74, pulse 84, temperature 97.6 F (36.4 C), resp. rate 18, height 5\' 10"  (1.778 m), weight 92 kg, SpO2 98 %. Physical Exam HENT:     Head: Normocephalic.     Right Ear: Tympanic membrane normal.     Nose: Nose normal.  Eyes:     Pupils: Pupils are equal, round, and reactive to light.  Cardiovascular:     Rate and Rhythm: Normal rate.     Pulses: Normal pulses.  Pulmonary:     Effort: Pulmonary effort is normal.  Abdominal:     General: Abdomen is flat.  Musculoskeletal:        General: Normal range of motion.     Cervical back: Normal range of motion.  Skin:    General: Skin is warm.  Neurological:     Mental Status: He is alert.     Comments: Patient is awake and alert strength 5-5 deltoid, bicep, tricep, wrist flexion, wrist extension, hand intrinsics.      Assessment/Plan 69 year old presents for ACDF C3-4 C4-5 C5-6  Elaina Hoops, MD 05/04/2020, 10:00 AM

## 2020-05-05 ENCOUNTER — Encounter (HOSPITAL_COMMUNITY): Payer: Self-pay | Admitting: Neurosurgery

## 2020-05-05 DIAGNOSIS — M4802 Spinal stenosis, cervical region: Secondary | ICD-10-CM | POA: Diagnosis not present

## 2020-05-05 MED ORDER — CYCLOBENZAPRINE HCL 10 MG PO TABS: 10.0000 mg | ORAL_TABLET | Freq: Three times a day (TID) | ORAL | 0 refills | Status: DC | PRN

## 2020-05-05 MED FILL — Thrombin For Soln 5000 Unit: CUTANEOUS | Qty: 5000 | Status: AC

## 2020-05-05 MED FILL — Sodium Chloride Irrigation Soln 0.9%: Qty: 500 | Status: AC

## 2020-05-05 MED FILL — Bacitracin Intramuscular For Soln 50000 Unit: INTRAMUSCULAR | Qty: 1 | Status: AC

## 2020-05-05 NOTE — Progress Notes (Signed)
Pt doing well. Pt and wife given D/C instructions with verbal understanding. Rx was e-prescribed to the Pt's pharmacy. Pt's incision is clean and dry with no sign of infection. Pt's IV and JP drain were removed prior to D/C. Pt D/C'd home via wheelchair per MD order. Pt is stable @ D/C and has no other needs at this time. Holli Humbles, RN

## 2020-05-05 NOTE — Discharge Instructions (Signed)
Wound Care Keep incision covered and dry for two days.    Do not put any creams, lotions, or ointments on incision. Leave steri-strips on back.  They will fall off by themselves. Activity Walk each and every day, increasing distance each day. No lifting greater than 5 lbs.  Avoid excessive neck motion. No driving for 2 weeks; may ride as a passenger locally. Diet Resume your normal diet.  Return to Work Will be discussed at your follow up appointment. Call Your Doctor If Any of These Occur Redness, drainage, or swelling at the wound.  Temperature greater than 101 degrees. Severe pain not relieved by pain medication. Incision starts to come apart. Follow Up Appt Call today for appointment in 1-2 weeks [phone]) or for problems.

## 2020-05-05 NOTE — Discharge Summary (Signed)
Physician Discharge Summary     Providing Compassionate, Quality Care - Together   Patient ID: Erik Salazar MRN: [id number] DOB/AGE: 69-Aug-1952 69 y.o.  Admit date: 05/04/2020 Discharge date: 05/05/2020  Admission Diagnoses: Spinal stenosis in cervical region  Discharge Diagnoses:  Active Problems:   Spinal stenosis in cervical region   Discharged Condition: good  Hospital Course: Patient was admitted to 3C11 following C3-4, C4-5, C5-6 ACDF by Dr. Saintclair Halsted on 05/04/2020. The patient is tolerating food well. He is having no issues with swallowing or complaints of shortness of breath. He denies bowel or bladder dysfunction. His pain is well-controlled with oral pain medication. He has ambulated independently multiple times in the hall. He is ready for discharge home.  Consults: None  Significant Diagnostic Studies: radiology: DG Cervical Spine 1 View  Result Date: 05/04/2020 CLINICAL DATA:  Surgery, elective. Additional history provided: C3-C6 ACDF (image taken before C4 screws were placed). Reported fluoroscopy time 7 seconds, 0.55 mGy. EXAM: DG CERVICAL SPINE - 1 VIEW; DG C-ARM 1-60 MIN COMPARISON:  CT of the cervical spine 03/05/2019. cervical spine MRI 03/03/2020. FINDINGS: A single lateral view fluoroscopic image of the cervical spine is submitted. The patient's shoulders partially obscure the C6 level. The image demonstrates sequela of interval C3-C6 ACDF. Screws had not yet been placed within the C4 vertebral body at the time the imaged was taken. Overlying soft tissue retractors. IMPRESSION: Single lateral view fluoroscopic image of the cervical spine demonstrating sequela of interval C3-C6 ACDF. The C4 vertebral screws had not yet been placed at the time the image was taken. Electronically Signed   By: Kellie Simmering DO   On: 05/04/2020 13:56   DG C-Arm 1-60 Min  Result Date: 05/04/2020 CLINICAL DATA:  Surgery, elective. Additional history provided: C3-C6 ACDF (image taken before C4  screws were placed). Reported fluoroscopy time 7 seconds, 0.55 mGy. EXAM: DG CERVICAL SPINE - 1 VIEW; DG C-ARM 1-60 MIN COMPARISON:  CT of the cervical spine 03/05/2019. cervical spine MRI 03/03/2020. FINDINGS: A single lateral view fluoroscopic image of the cervical spine is submitted. The patient's shoulders partially obscure the C6 level. The image demonstrates sequela of interval C3-C6 ACDF. Screws had not yet been placed within the C4 vertebral body at the time the imaged was taken. Overlying soft tissue retractors. IMPRESSION: Single lateral view fluoroscopic image of the cervical spine demonstrating sequela of interval C3-C6 ACDF. The C4 vertebral screws had not yet been placed at the time the image was taken. Electronically Signed   By: Kellie Simmering DO   On: 05/04/2020 13:56     Treatments: surgery: Anterior cervical discectomies and fusion at C3-4, C4-5, C5-6 utilizing the globus titanium cages packed with locally harvested autograft mixed with DBX mix   Discharge Exam: Blood pressure (!) 151/55, pulse 86, temperature 98.1 F (36.7 C), temperature source Oral, resp. rate 18, height 5\' 10"  (1.778 m), weight 92 kg, SpO2 94 %.   Alert and oriented x 4 PERRLA CN II-XII grossly intact MAE, Strength and sensation intact Incision is covered with Honeycomb dressing and Steri Strips; Dressing is clean, dry, and intact   Disposition: Discharge disposition: 01-Home or Self Care        Allergies as of 05/05/2020      Reactions   Zetia [ezetimibe] Other (See Comments)   Leg cramps   Oxycodone Hcl    DELUSIONAL   Oxycontin [oxycodone]    DELUSIONAL   Percocet [oxycodone-acetaminophen]    DELUSIONAL   Sulfur Rash  Medication List    TAKE these medications   acetaminophen 325 MG tablet Commonly known as: TYLENOL Take 650 mg by mouth every 6 (six) hours as needed for moderate pain or headache.   ascorbic acid 500 MG tablet Commonly known as: VITAMIN C Take 500 mg by mouth  daily.   aspirin EC 81 MG tablet Take 81 mg by mouth daily. Swallow whole.   cyclobenzaprine 10 MG tablet Commonly known as: FLEXERIL Take 1 tablet (10 mg total) by mouth 3 (three) times daily as needed for muscle spasms.   levETIRAcetam 750 MG tablet Commonly known as: KEPPRA Take 1 tablet (750 mg total) by mouth 2 (two) times daily.   metoprolol succinate 25 MG 24 hr tablet Commonly known as: TOPROL-XL Take 0.5 tablets (12.5 mg total) by mouth daily. With or immediately following a meal. What changed:   when to take this  additional instructions   pravastatin 40 MG tablet Commonly known as: PRAVACHOL TAKE ONE TABLET BY MOUTH EVERY EVENING   tamsulosin 0.4 MG Caps capsule Commonly known as: FLOMAX Take 0.4 mg by mouth every evening.   Vitamin D3 250 MCG (10000 UT) Tabs Take 10,000 Units by mouth daily.       Follow-up Information    Kary Kos, MD. Schedule an appointment as soon as possible for a visit in 2 week(s).   Specialty: Neurosurgery Contact information: [address] [place] [address] [phone]               Signed: Patricia Nettle 05/05/2020, 9:11 AM

## 2020-07-12 ENCOUNTER — Encounter: Payer: Self-pay | Admitting: Neurology

## 2020-07-12 ENCOUNTER — Other Ambulatory Visit: Payer: Self-pay

## 2020-07-12 ENCOUNTER — Ambulatory Visit (INDEPENDENT_AMBULATORY_CARE_PROVIDER_SITE_OTHER): Payer: Medicare HMO | Admitting: Neurology

## 2020-07-12 VITALS — BP 144/76 | HR 79 | Ht 70.0 in | Wt 201.0 lb

## 2020-07-12 DIAGNOSIS — G250 Essential tremor: Secondary | ICD-10-CM | POA: Diagnosis not present

## 2020-07-12 DIAGNOSIS — G40909 Epilepsy, unspecified, not intractable, without status epilepticus: Secondary | ICD-10-CM | POA: Diagnosis not present

## 2020-07-12 NOTE — Progress Notes (Signed)
Reason for visit: Seizures, essential tremor, right C6 radiculopathy  Erik Salazar is an 69 y.o. male  History of present illness:  Erik Salazar is a 69 year old right-handed white male with a history of bifrontal strokes and seizures.  The patient also has an essential tremor.  He has had troubles with right hand numbness associated with a C6 radiculopathy.  He underwent surgery through Dr. Saintclair Halsted on 04 May 2020.  He is recovering from the surgery that was a 3 level procedure at the C3-4 and C4-5 and C5-6 levels.  The patient still has some right hand numbness but has no significant discomfort.  The patient remains on Keppra taking 750 mg twice daily, his last seizure event was in July 2020 when he tried to come off of the [place].  The patient indicates that the [place] helps his tremor, if he takes his medication late, he notes that the tremor will worsen.  The patient is being evaluated for possible hiatal hernia surgery in December 2021.  Overall he is doing well at this time.  Past Medical History:  Diagnosis Date   Carpal tunnel syndrome on right 07/08/2017   Cervical radiculopathy at C6 9/83/3825   Right   Complication of anesthesia    Coronary artery disease    Enlarged prostate    H/O: knee surgery    15    History of open heart surgery    ASCENDING AORTIC ANEURYSM   Ischemic stroke of frontal lobe ([place]) 03/14/2017   Bilateral; post-redo CT surgery   PONV (postoperative nausea and vomiting)    only after CABG surgeries   Seizure disorder ([place]) 03/14/2017   Seizures ([place])    Status post knee surgery    DVT POST KNEE SURGERY    Past Surgical History:  Procedure Laterality Date   ANTERIOR CERVICAL DECOMP/DISCECTOMY FUSION N/A 05/04/2020   Procedure: Anterior Cervical Decompression/Discectomy [place] Cervical three-four, Cervical four-five, Cervical five-six;  Surgeon: Kary Kos, MD;  Location: [facility];  Service: Neurosurgery;  Laterality: N/A;   BALLOON  DILATION N/A 06/23/2019   Procedure: BALLOON DILATION;  Surgeon: Otis Brace, MD;  [facility];  Service: Gastroenterology;  Laterality: N/A;   BENTALL PROCEDURE  01/04/2016   Bentall with 23 mm pericardial AVR; SVG-LAD, SVG-CX ([place])   BIOPSY  06/23/2019   Procedure: BIOPSY;  Surgeon: Otis Brace, MD;  [facility];  Service: Gastroenterology;;   CARDIAC SURGERY     ANUERSYM MAY 2017   Bentall procedure. Bioprosthetic aortic valve #23 mm bovine model #2700 TF ask, and 28 mm Gelweave woven vascular sinus of Valsalva graft   CARPAL TUNNEL RELEASE  08/2018   right hand    CORONARY ARTERY BYPASS GRAFT  01/04/2016   VG to LAD & VG to LCX   CORONARY ARTERY BYPASS GRAFT  03/13/2017   LIMA to LAD with steril abcess removal from dacron graft   ESOPHAGOGASTRODUODENOSCOPY (EGD) WITH PROPOFOL N/A 06/23/2019   Procedure: ESOPHAGOGASTRODUODENOSCOPY (EGD) WITH PROPOFOL;  Surgeon: Otis Brace, MD;  [facility];  Service: Gastroenterology;  Laterality: N/A;   FALSE ANEURYSM REPAIR  03/13/2017   redo sternotomy, sterile abscess removal from Dacron graft, omental flap around aorta, CABG: LIMA-LAD ([name], Dr. Mart Piggs)   knee surgeries     13 surgeries on knee done before knee replacement    [place]   open heart surgery     03-13-2017   REPLACEMENT TOTAL KNEE  2015   ROTATOR CUFF  REPAIR      Family History  Problem Relation Age of Onset   Aneurysm Mother        brain aneurysm for mother.     Social history:  reports that he has quit smoking. His smoking use included cigars. He has never used smokeless tobacco. He reports current alcohol use of about 1.0 standard drink of alcohol per week. He reports that he does not use drugs.    Allergies  Allergen Reactions   Zetia [Ezetimibe] Other (See Comments)    Leg cramps   Oxycodone Hcl     DELUSIONAL   Oxycontin [Oxycodone]     DELUSIONAL     Percocet [Oxycodone-Acetaminophen]     DELUSIONAL   Sulfur Rash    Medications:  Prior to Admission medications   Medication Sig Start Date End Date Taking? Authorizing Provider  acetaminophen (TYLENOL) 325 MG tablet Take 650 mg by mouth every 6 (six) hours as needed for moderate pain or headache.    [provider]  ascorbic acid (VITAMIN C) 500 MG tablet Take 500 mg by mouth daily.    [provider]  aspirin EC 81 MG tablet Take 81 mg by mouth daily. Swallow whole.    [provider]  Cholecalciferol (VITAMIN D3) 250 MCG (10000 UT) TABS Take 10,000 Units by mouth daily.    [provider]  cyclobenzaprine (FLEXERIL) 10 MG tablet Take 1 tablet (10 mg total) by mouth 3 (three) times daily as needed for muscle spasms. 05/05/20   Viona Gilmore D, NP  levETIRAcetam (KEPPRA) 750 MG tablet Take 1 tablet (750 mg total) by mouth 2 (two) times daily. 01/27/20   Kathrynn Ducking, MD  metoprolol succinate (TOPROL-XL) 25 MG 24 hr tablet Take 0.5 tablets (12.5 mg total) by mouth daily. With or immediately following a meal. Patient taking differently: Take 12.5 mg by mouth at bedtime.  12/03/19   Jerline Pain, MD  pravastatin (PRAVACHOL) 40 MG tablet TAKE ONE TABLET BY MOUTH EVERY EVENING Patient taking differently: Take 40 mg by mouth every evening.  01/27/20   Jerline Pain, MD  tamsulosin (FLOMAX) 0.4 MG CAPS capsule Take 0.4 mg by mouth every evening.     [provider]    ROS:  Out of a complete 14 system review of symptoms, the patient complains only of the following symptoms, and all other reviewed systems are negative.  Tremor Hand numbness  Blood pressure (!) 144/76, pulse 79, height 5\' 10"  (1.778 m), weight 201 lb (91.2 kg).  Physical Exam  General: The patient is alert and cooperative at the time of the examination.  Skin: No significant peripheral edema is noted.   Neurologic Exam  Mental status: The patient is alert and oriented x  3 at the time of the examination. The patient has apparent normal recent and remote memory, with an apparently normal attention span and concentration ability.   Cranial nerves: Facial symmetry is present. Speech is normal, no aphasia or dysarthria is noted. Extraocular movements are full. Visual fields are full.  Motor: The patient has good strength in all 4 extremities.  Sensory examination: Soft touch sensation is symmetric on the face, arms, and legs, with exception of there is decreased sensation to soft touch on the right hand.  Coordination: The patient has good finger-nose-finger and heel-to-shin bilaterally.  Gait and station: The patient has a normal gait. Tandem gait is normal. Romberg is negative. No drift is seen.  Reflexes: Deep tendon reflexes  are symmetric.   Assessment/Plan:  1.  Right C6 radiculopathy  2.  History of seizures  3.  Essential tremor  Overall, the patient is doing well.  He is tolerating the Keppra, this also helps his tremors.  He will continue 750 mg twice a day of Keppra.  He will follow-up in 1 year.  He is recovering from cervical spine surgery.  Jill Alexanders MD 07/12/2020 7:09 AM  [facility] [address] [place], [place] [address]  Phone [phone] Fax [phone]

## 2020-07-25 ENCOUNTER — Encounter: Payer: Self-pay | Admitting: Surgery

## 2020-07-25 ENCOUNTER — Ambulatory Visit: Payer: Self-pay | Admitting: Surgery

## 2020-07-25 NOTE — H&P (Signed)
Erik Salazar Appointment: 07/25/2020 9:30 AM Location: [facility] Patient #: [id number] DOB: Feb 24, 1951 Married / Language: Erik Salazar / Race: White Male  History of Present Illness Erik Hector MD; 07/25/2020 11:03 AM) The patient is a 69 year old male who presents for an evaluation of a hernia. Note for "Hernia": ` ` ` Patient sent for surgical consultation at the request of Dr Erik Hanger md  Chief Complaint: Hernia ` ` The patient is a pleasant gentleman with history of stable coronary disease. Felt like he had a coronary bypass grafting with valve aortic arch surgery when he was up in [place] 2017. He relocated [place] 2018. Had to have a redo bypass surgery at [facility]. Had have omental pedicle flap to help close the leave the pericardium. It is no longer on any more aggressive anticoagulation aside from aspirin. Noted some bulging in his upper abdomen. Does not know if this is a hiatal hernia or incisional hernia. Discuss with his primary care physician. Surgical consultation offered. Patient notes he gets some bulging just below his breast bone in the epigastric region. Does have some discomfort with the sternum occasionally with lifting or twisting but that is not changed much. Used to smoke but has not done that in many years. He walks on a treadmill and does 5K every day. We will his bowels about 3 times a day. Recalls being diagnosed with a stroke and is on antiseizure medicines. Occasionally has some memory issues. Wife helps take care of him. He denies any heartburn or reflux recalls being told he had a hiatal hernia and had a tight. The needed to be dilated. Looking at records he's followed by [facility] gastroenterology. Dr. Alessandra Salazar. Last endoscopy November 2020 around Schatzki's ring. Very small hiatal hernia noted on anoscopy.  No personal nor family history of GI/colon cancer, inflammatory bowel disease, irritable bowel  syndrome, allergy such as Celiac Sprue, dietary/dairy problems, colitis, ulcers nor gastritis. No recent sick contacts/gastroenteritis. No travel outside the country. No changes in diet. No dysphagia to solids or liquids. No significant heartburn or reflux. No melena, hematemesis, coffee ground emesis. No evidence of prior gastric/peptic ulceration. Denies any history of any skin infections.  (Review of systems as stated in this history (HPI) or in the review of systems. Otherwise all other 12 point ROS are negative) ` ` ###########################################`  This patient encounter took 50 minutes today to perform the following: obtain history, perform exam, review outside records, interpret tests & imaging, counsel the patient on their diagnosis; and, document this encounter, including findings & plan in the electronic health record (EHR).   Past Surgical History Erik Salazar, [place]; 07/25/2020 8:46 AM) Aneurysm Repair Bypass Surgery for Poor Blood Flow to Legs Colon Polyp Removal - Colonoscopy Coronary Artery Bypass Graft Foot Surgery Bilateral. Knee Surgery Bilateral. Oral Surgery Shoulder Surgery Right. Spinal Surgery - Neck Tonsillectomy Valve Replacement  Diagnostic Studies History Erik Salazar, [place]; 07/25/2020 8:46 AM) Colonoscopy 1-5 years ago  Allergies Erik Salazar, CMA; 07/25/2020 8:48 AM) Sulfur *CHEMICALS* oxyCODONE HCl *ANALGESICS - OPIOID* OxyCONTIN *ANALGESICS - OPIOID* Percocet *ANALGESICS - OPIOID* Allergies Reconciled  Medication History Erik Salazar, CMA; 07/25/2020 8:51 AM) Doxycycline Monohydrate (100MG  Capsule, Oral) Active. Metoprolol Succinate ER (25MG  Tablet ER 24HR, Oral) Active. Pravastatin Sodium (40MG  Tablet, Oral) Active. Aspirin (81MG  Tablet, Oral) Active. Flomax (0.4MG  Capsule, Oral) Active. levETIRAcetam (750MG  Tablet, Oral) Active. Medications Reconciled  Social History Erik Salazar,  [place]; 07/25/2020 8:46 AM) Alcohol use Occasional alcohol use. Caffeine use Coffee.  No drug use Tobacco use Never Salazar.  Family History Erik Salazar, [place]; 07/25/2020 8:46 AM) Arthritis Father, Mother, Sister. Ischemic Bowel Disease Mother. Migraine Headache Mother.  Other Problems Erik Salazar, CMA; 07/25/2020 8:46 AM) Arthritis Back Pain Cerebrovascular Accident Heart murmur Hemorrhoids High blood pressure Seizure Disorder Transfusion history Ulcerative Colitis     Review of Systems Erik Salazar CMA; 07/25/2020 8:46 AM) General Not Present- Appetite Loss, Chills, Fatigue, Fever, Night Sweats, Weight Gain and Weight Loss. Skin Not Present- Change in Wart/Mole, Dryness, Hives, Jaundice, New Lesions, Non-Healing Wounds, Rash and Ulcer. HEENT Not Present- Earache, Hearing Loss, Hoarseness, Nose Bleed, Oral Ulcers, Ringing in the Ears, Seasonal Allergies, Sinus Pain, Sore Throat, Visual Disturbances, Wears glasses/contact lenses and Yellow Eyes. Respiratory Not Present- Bloody sputum, Chronic Cough, Difficulty Breathing, Snoring and Wheezing. Breast Not Present- Breast Mass, Breast Pain, Nipple Discharge and Skin Changes. Cardiovascular Not Present- Chest Pain, Difficulty Breathing Lying Down, Leg Cramps, Palpitations, Rapid Heart Rate, Shortness of Breath and Swelling of Extremities. Gastrointestinal Present- Difficulty Swallowing. Not Present- Abdominal Pain, Bloating, Bloody Stool, Change in Bowel Habits, Chronic diarrhea, Constipation, Excessive gas, Gets full quickly at meals, Hemorrhoids, Indigestion, Nausea, Rectal Pain and Vomiting. Male Genitourinary Not Present- Blood in Urine, Change in Urinary Stream, Frequency, Impotence, Nocturia, Painful Urination, Urgency and Urine Leakage. Musculoskeletal Present- Back Pain, Joint Pain, Joint Stiffness and Muscle Pain. Not Present- Muscle Weakness and Swelling of Extremities. Neurological Present- Decreased  Memory and Tremor. Not Present- Fainting, Headaches, Numbness, Seizures, Tingling, Trouble walking and Weakness. Psychiatric Not Present- Anxiety, Bipolar, Change in Sleep Pattern, Depression, Fearful and Frequent crying. Endocrine Not Present- Cold Intolerance, Excessive Hunger, Hair Changes, Heat Intolerance, Hot flashes and New Diabetes. Hematology Not Present- Blood Thinners, Easy Bruising, Excessive bleeding, Gland problems, HIV and Persistent Infections.  Vitals Erik Salazar CMA; 07/25/2020 8:51 AM) 07/25/2020 8:51 AM Weight: 202.25 lb Height: 70in Body Surface Area: 2.1 m Body Mass Index: 29.02 kg/m  Temp.: 98.17F  Pulse: 84 (Regular)  BP: 136/82(Sitting, Left Arm, Standard)        Physical Exam Erik Hector MD; 07/25/2020 9:13 AM)  General Mental Status-Alert. General Appearance-Not in acute distress, Not Sickly. Orientation-Oriented X3. Hydration-Well hydrated. Voice-Normal.  Integumentary Global Assessment Upon inspection and palpation of skin surfaces of the - Axillae: non-tender, no inflammation or ulceration, no drainage. and Distribution of scalp and body hair is normal. General Characteristics Temperature - normal warmth is noted.  Head and Neck Head-normocephalic, atraumatic with no lesions or palpable masses. Face Global Assessment - atraumatic, no absence of expression. Neck Global Assessment - no abnormal movements, no bruit auscultated on the right, no bruit auscultated on the left, no decreased range of motion, non-tender. Trachea-midline. Thyroid Gland Characteristics - non-tender.  Eye Eyeball - Left-Extraocular movements intact, No Nystagmus - Left. Eyeball - Right-Extraocular movements intact, No Nystagmus - Right. Cornea - Left-No Hazy - Left. Cornea - Right-No Hazy - Right. Sclera/Conjunctiva - Left-No scleral icterus, No Discharge - Left. Sclera/Conjunctiva - Right-No scleral icterus, No  Discharge - Right. Pupil - Left-Direct reaction to light normal. Pupil - Right-Direct reaction to light normal.  ENMT Ears Pinna - Left - no drainage observed, no generalized tenderness observed. Pinna - Right - no drainage observed, no generalized tenderness observed. Nose and Sinuses External Inspection of the Nose - no destructive lesion observed. Inspection of the nares - Left - quiet respiration. Inspection of the nares - Right - quiet respiration. Mouth and Throat Lips - Upper Lip - no fissures observed,  no pallor noted. Lower Lip - no fissures observed, no pallor noted. Nasopharynx - no discharge present. Oral Cavity/Oropharynx - Tongue - no dryness observed. Oral Mucosa - no cyanosis observed. Hypopharynx - no evidence of airway distress observed.  Chest and Lung Exam Inspection Movements - Normal and Symmetrical. Accessory muscles - No use of accessory muscles in breathing. Palpation Palpation of the chest reveals - Non-tender. Auscultation Breath sounds - Normal and Clear. Note: Some chest wall tenderness to sternal pressure but no evidence of any abscess/click  Cardiovascular Auscultation Rhythm - Regular. Murmurs & Other Heart Sounds - Auscultation of the heart reveals - No Murmurs and No Systolic Clicks.  Abdomen Inspection Inspection of the abdomen reveals - No Visible peristalsis and No Abnormal pulsations. Umbilicus - No Bleeding, No Urine drainage. Palpation/Percussion Palpation and Percussion of the abdomen reveal - Soft, Non Tender, No Rebound tenderness, No Rigidity (guarding) and No Cutaneous hyperesthesia. Note: Abdomen obese and thin-walled with epigastric and left paramedian vertical incisions. Obvious epigastric incisional hernia 6 x 5 cm region reduces down. No definite periumbilical ventral hernia.  Abdomen soft. Nontender. Not distended. No guarding.  Male Genitourinary Sexual Maturity Tanner 5 - Adult hair pattern and Adult penile size and  shape. Note: No inguinal hernias. Normal external genitalia. Epididymi, testes, and spermatic cords normal without any masses.  Peripheral Vascular Upper Extremity Inspection - Left - No Cyanotic nailbeds - Left, Not Ischemic. Inspection - Right - No Cyanotic nailbeds - Right, Not Ischemic.  Neurologic Neurologic evaluation reveals -normal attention span and ability to concentrate, able to name objects and repeat phrases. Appropriate fund of knowledge , normal sensation and normal coordination. Mental Status Affect - not angry, not paranoid. Cranial Nerves-Normal Bilaterally. Gait-Normal.  Neuropsychiatric Mental status exam performed with findings of-able to articulate well with normal speech/language, rate, volume and coherence, thought content normal with ability to perform basic computations and apply abstract reasoning and no evidence of hallucinations, delusions, obsessions or homicidal/suicidal ideation.  Musculoskeletal Global Assessment Spine, Ribs and Pelvis - no instability, subluxation or laxity. Right Upper Extremity - no instability, subluxation or laxity.  Lymphatic Head & Neck  General Head & Neck Lymphatics: Bilateral - Description - No Localized lymphadenopathy. Axillary  General Axillary Region: Bilateral - Description - No Localized lymphadenopathy. Femoral & Inguinal  Generalized Femoral & Inguinal Lymphatics: Left - Description - No Localized lymphadenopathy. Right - Description - No Localized lymphadenopathy.    Assessment & Plan Erik Hector MD; 07/25/2020 11:04 AM)  Fatima Blank HERNIA, WITHOUT OBSTRUCTION OR GANGRENE (K43.2) Impression: Pleasant gentleman with, getting cardiac history. CABG 2017 in [place] with Bentall valve surgery. Aortic aneurysm repair. Redo CABG 2018 with omental pedicle flap closure at [facility]  Past epigastric incisional hernia most likely related to his prior incisions as well as his abdominal incisions. He  physically getting larger more symptomatic. He would benefit from hernia repair. Reasonably laparoscopic approach with underlay mesh.  The challenge be to preserve and protect his omentum since it is a pedicle flap covering his chest last pericardium.  Would like cardiac clearance. Patient has good exercise tolerance and uses a treadmill 5K. Just got over neck surgery. However, with discomfort getting cardiac history would like Dr. Camila Li input. Okay to continue aspirin for my standpoint.   HISTORY OF CORONARY ARTERY BYPASS GRAFT (Z95.1) Impression: History of cardiac and valvular surgery [place] with redo bypass in 2018. Seems relatively stable. Just got over neck surgery but I think it would be wise for  him to get cardiac clearance.  Current Plans I recommended obtaining preoperative cardiac clearance. I am concerned about the health of the patient and the ability to tolerate the operation. Therefore, we will request clearance by cardiology to better assess operative risk & see if a reevaluation, further workup, etc is needed. Also recommendations on how medications such as for anticoagulation and blood pressure should be managed/held/restarted after surgery.  PREOP - [place] - ENCOUNTER FOR PREOPERATIVE EXAMINATION FOR GENERAL SURGICAL PROCEDURE (Z01.818)  Current Plans You are being scheduled for surgery- Our schedulers will call you.  You should hear from our office's scheduling department within 5 working days about the location, date, and time of surgery. We try to make accommodations for patient's preferences in scheduling surgery, but sometimes the OR schedule or the surgeon's schedule prevents [place] from making those accommodations.  If you have not heard from our office [phone]) in 5 working days, call the office and ask for your surgeon's nurse.  If you have other questions about your diagnosis, plan, or surgery, call the office and ask for your surgeon's nurse.  Written  instructions provided [facility] Consent - Hernia Repair - Ventral/Incisional/Umbilical (Tashena Ibach): discussed with patient and provided information. Pt Education - [facility] Hernia Post-Op HCI (Patrice Matthew): discussed with patient and provided information. Pt Education - [facility] Pain Control (Uzoma Vivona) Pt Education - Pamphlet Given - Laparoscopic Hernia Repair: discussed with patient and provided information. Pt Education - [facility] Mesh education: discussed with patient and provided information.  DIASTASIS RECTI (M62.08) Impression: Upper abdominal diastases recti making the incisional hernia more likely. Therefore deftly mesh repair  Current Plans Pt Education - [facility] Diastasis Recti: discussed with patient and provided information.  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 Alessandra Salazar for what sounds like a shock sees rings less esophageal web that was balloon dilated. Patient recalls prior balloon dilatations when he lived up in [place] as well. Denies much in way of heartburn or reflux. No dysphagia at this time. His only mention a very small hiatal hernia on the EGD report and no major one noted on the chest CT. I think his concern about a possible hiatal hernia is not his major issue   HIATAL HERNIA (K44.9) Impression: Denies much in way of heartburn or reflux. No dysphagia at this time.  While the patient wondered if he had been told he had an anal hernia, the EGD from last year notes only a very small sliding 1. Certainly no major have a hernia on chest CT. Most likely a reflection of needing the omental pedicle flap to help with chest wall and pericardial closure  He is asymptomatic and I do not think he needs any hiatal hernia surgery at this time. We'll defer to his gastrologist. Patient on he was due to see gastrology January but [facility] is not aware of this. I will defer to them.  Erik Hector, MD, FACS, MASCRS Gastrointestinal and Minimally  Invasive Surgery  [facility] [address], [place], [place] [address] [phone] Fax [phone] Main/Paging  CONTACT INFORMATION: Weekday (9AM-5PM) concerns: Call [facility] main office at [phone] Weeknight (5PM-9AM) or Weekend/Holiday concerns: Check [email or web address] for General Surgery [facility] coverage (Please, do not use SecureChat as it is not reliable communication to operating surgeons for immediate patient care)

## 2020-07-26 ENCOUNTER — Telehealth: Payer: Self-pay | Admitting: *Deleted

## 2020-07-26 NOTE — Telephone Encounter (Signed)
   [facility] Pre-operative Risk Assessment    HEARTCARE STAFF: - Please ensure there is not already an duplicate clearance open for this procedure. - Under Visit Info/Reason for Call, type in Other and utilize the format Clearance MM/DD/YY or Clearance TBD. Do not use dashes or single digits. - If request is for dental extraction, please clarify the # of teeth to be extracted.  Request for surgical clearance:  1. What type of surgery is being performed?  LAPAROSCOPIC VENTRAL WALL HERNIA REPAIR   2. When is this surgery scheduled?  TBD   3. What type of clearance is required (medical clearance vs. Pharmacy clearance to hold med vs. Both)?  BOTH  4. Are there any medications that need to be held prior to surgery and how long? ASPIRIN   5. Practice name and name of physician performing surgery?  [facility] / DR. Johney Maine   6. What is the office phone number?  [phone]   7.   What is the office fax number?  [phone]  8.   Anesthesia type (None, local, MAC, general) ?  GENERAL    Jeanann Lewandowsky 07/26/2020, 3:08 PM  _________________________________________________________________   (provider comments below)

## 2020-07-26 NOTE — Telephone Encounter (Signed)
   Primary Cardiologist: Candee Furbish, MD  Chart reviewed and patient contacted today by phone as part of pre-operative protocol coverage. Given past medical history and time since last visit, based on ACC/AHA guidelines, Erik Salazar would be at acceptable risk for the planned procedure without further cardiovascular testing.   OK to hold aspirin 5 days pre op if needed.  The patient was advised that if he develops new symptoms prior to surgery to contact our office to arrange for a follow-up visit, and he verbalized understanding.  I will route this recommendation to the requesting party via Epic fax function and remove from pre-op pool.  Please call with questions.  Kerin Ransom, PA-C 07/26/2020, 4:25 PM

## 2020-09-13 ENCOUNTER — Other Ambulatory Visit (HOSPITAL_COMMUNITY): Payer: Medicare HMO

## 2020-10-05 NOTE — Progress Notes (Signed)
Surgical Instructions    Your procedure is scheduled on 10/12/20.  Report to [facility] Entrance "A" at 12:55 P.M., then check in with the Admitting office.  Call this number if you have problems the morning of surgery:  [phone]   If you have any questions prior to your surgery date call [phone]: Open Monday-Friday 8am-4pm    Remember:  Do not eat after midnight the night before your surgery  You may drink clear liquids until 11:55am the morning of your surgery.   Clear liquids allowed are: Water, Non-Citrus Juices (without pulp), Carbonated Beverages, Clear Tea, Black Coffee Only, and Gatorade   Patient Instructions  . The night before surgery:  o No food after midnight. ONLY clear liquids after midnight  . The day of surgery (if you do NOT have diabetes):  o Drink ONE (1) Pre-Surgery Clear Ensure by 11:55am. Drink in one sitting. Do not sip.  o This drink was given to you during your hospital  pre-op appointment visit. o Nothing else to drink after completing the  Pre-Surgery Clear Ensure.         If you have questions, please contact your surgeon's office.   Take these medicines the morning of surgery with A SIP OF WATER  levETIRAcetam (KEPPRA)  metoprolol succinate (TOPROL-XL)- if taken in the morning   As of today, STOP taking any (unless otherwise instructed by your surgeon) Aleve, Naproxen, Ibuprofen, Motrin, Advil, Goody's, BC's, all herbal medications, fish oil, and all vitamins. You can continue taking your aspirin through the day of surgery.                      Do not wear jewelry, make up, or nail polish            Do not wear lotions, powders, perfumes/colognes, or deodorant.            Men may shave face and neck.            Do not bring valuables to the hospital.            [facility] is not responsible for any belongings or valuables.  Do NOT Smoke (Tobacco/Vaping) or drink Alcohol 24 hours prior to your procedure If you use a CPAP at  night, you may bring all equipment for your overnight stay.   Contacts, glasses, dentures or bridgework may not be worn into surgery, please bring cases for these belongings   For patients admitted to the hospital, discharge time will be determined by your treatment team.   Patients discharged the day of surgery will not be allowed to drive home, and someone needs to stay with them for 24 hours.    Special instructions:   Erik Salazar- Preparing For Surgery  Before surgery, you can play an important role. Because skin is not sterile, your skin needs to be as free of germs as possible. You can reduce the number of germs on your skin by washing with CHG (chlorahexidine gluconate) Soap before surgery.  CHG is an antiseptic cleaner which kills germs and bonds with the skin to continue killing germs even after washing.    Oral Hygiene is also important to reduce your risk of infection.  Remember - BRUSH YOUR TEETH THE MORNING OF SURGERY WITH YOUR REGULAR TOOTHPASTE  Please do not use if you have an allergy to CHG or antibacterial soaps. If your skin becomes reddened/irritated stop using the CHG.  Do not shave (including legs  and underarms) for at least 48 hours prior to first CHG shower. It is OK to shave your face.  Please follow these instructions carefully.   1. Shower the NIGHT BEFORE SURGERY and the MORNING OF SURGERY  2. If you chose to wash your hair, wash your hair first as usual with your normal shampoo.  3. After you shampoo, rinse your hair and body thoroughly to remove the shampoo.  4. Wash Face and genitals (private parts) with your normal soap.   5.  Shower the NIGHT BEFORE SURGERY and the MORNING OF SURGERY with CHG Soap.   6. Use CHG Soap as you would any other liquid soap. You can apply CHG directly to the skin and wash gently with a scrungie or a clean washcloth.   7. Apply the CHG Soap to your body ONLY FROM THE NECK DOWN.  Do not use on open wounds or open sores. Avoid  contact with your eyes, ears, mouth and genitals (private parts). Wash Face and genitals (private parts)  with your normal soap.   8. Wash thoroughly, paying special attention to the area where your surgery will be performed.  9. Thoroughly rinse your body with warm water from the neck down.  10. DO NOT shower/wash with your normal soap after using and rinsing off the CHG Soap.  11. Pat yourself dry with a CLEAN TOWEL.  12. Wear CLEAN PAJAMAS to bed the night before surgery  13. Place CLEAN SHEETS on your bed the night before your surgery  14. DO NOT SLEEP WITH PETS.   Day of Surgery: Take a shower.  Wear Clean/Comfortable clothing the morning of surgery Do not apply any deodorants/lotions.   Remember to brush your teeth WITH YOUR REGULAR TOOTHPASTE.   Please read over the following fact sheets that you were given.

## 2020-10-06 ENCOUNTER — Ambulatory Visit (HOSPITAL_COMMUNITY): Payer: Medicare HMO | Admitting: Physician Assistant

## 2020-10-06 ENCOUNTER — Encounter (HOSPITAL_COMMUNITY): Payer: Self-pay

## 2020-10-06 ENCOUNTER — Other Ambulatory Visit: Payer: Self-pay

## 2020-10-06 ENCOUNTER — Ambulatory Visit (HOSPITAL_COMMUNITY): Payer: Medicare HMO | Admitting: Anesthesiology

## 2020-10-06 ENCOUNTER — Encounter (HOSPITAL_COMMUNITY)
Admission: RE | Admit: 2020-10-06 | Discharge: 2020-10-06 | Disposition: A | Discharge status: Home / Self Care | Payer: Medicare HMO | Source: Ambulatory Visit | Attending: Surgery | Admitting: Surgery

## 2020-10-06 DIAGNOSIS — I4510 Unspecified right bundle-branch block: Secondary | ICD-10-CM | POA: Insufficient documentation

## 2020-10-06 DIAGNOSIS — I10 Essential (primary) hypertension: Secondary | ICD-10-CM | POA: Insufficient documentation

## 2020-10-06 DIAGNOSIS — Z01818 Encounter for other preprocedural examination: Secondary | ICD-10-CM | POA: Insufficient documentation

## 2020-10-06 LAB — CBC
HCT: 46.6 % (ref 39.0–52.0)
Hemoglobin: 15.5 g/dL (ref 13.0–17.0)
MCH: 27.9 pg (ref 26.0–34.0)
MCHC: 33.3 g/dL (ref 30.0–36.0)
MCV: 83.8 fL (ref 80.0–100.0)
Platelets: 284 10*3/uL (ref 150–400)
RBC: 5.56 MIL/uL (ref 4.22–5.81)
RDW: 13.3 % (ref 11.5–15.5)
WBC: 9.1 10*3/uL (ref 4.0–10.5)
nRBC: 0 % (ref 0.0–0.2)

## 2020-10-06 LAB — BASIC METABOLIC PANEL
Anion gap: 10 (ref 5–15)
BUN: 16 mg/dL (ref 8–23)
CO2: 23 mmol/L (ref 22–32)
Calcium: 9.4 mg/dL (ref 8.9–10.3)
Chloride: 99 mmol/L (ref 98–111)
Creatinine, Ser: 1 mg/dL (ref 0.61–1.24)
GFR, Estimated: >60 mL/min (ref >60)
Glucose, Bld: 101 mg/dL — ABNORMAL HIGH (ref 70–99)
Potassium: 4.7 mmol/L (ref 3.5–5.1)
Sodium: 132 mmol/L — ABNORMAL LOW (ref 135–145)

## 2020-10-06 LAB — GLUCOSE, CAPILLARY: Glucose-Capillary: 152 mg/dL — ABNORMAL HIGH (ref 70–99)

## 2020-10-06 NOTE — Progress Notes (Signed)
PCP - [facility] Cardiologist - Dr. Candee Furbish  Chest x-ray - n/a EKG - 10/06/20 Stress Test - 3 years ago at  [facility]; -normal per patient ECHO - 09/24/17 Cardiac Cath -03/12/17-CE   Sleep Study - denies CPAP - denies  Blood Thinner Instructions:n/a Aspirin Instructions: Per Cards; hold 5 days pre-op. Pt has held since 10/04/20.   ERAS Protcol -Yes; stop clears by 1155 PRE-SURGERY Ensure or G2- (1) surgical Ensure provided.  COVID TEST- 10/08/20 (Saturday)   Anesthesia review: Yes, cardiac clearance 07/26/20.   Patient denies shortness of breath, fever, cough and chest pain at PAT appointment   All instructions explained to the patient, with a verbal understanding of the material. Patient agrees to go over the instructions while at home for a better understanding. Patient also instructed to self quarantine after being tested for COVID-19. The opportunity to ask questions was provided.

## 2020-10-07 NOTE — Anesthesia Preprocedure Evaluation (Deleted)
Anesthesia Evaluation  Patient identified by MRN, date of birth, ID band Patient awake    Reviewed: Allergy & Precautions, H&P , NPO status , Patient's Chart, lab work & pertinent test results  History of Anesthesia Complications (+) PONV  Airway Mallampati: II  TM Distance: >3 FB Neck ROM: Limited    Dental no notable dental hx. (+) Dental Advisory Given   Pulmonary neg pulmonary ROS, former smoker,    Pulmonary exam normal breath sounds clear to auscultation       Cardiovascular + CAD and + CABG  Normal cardiovascular exam Rhythm:Regular Rate:Normal     Neuro/Psych CVA negative psych ROS   GI/Hepatic negative GI ROS, Neg liver ROS,   Endo/Other  negative endocrine ROS  Renal/GU negative Renal ROS  negative genitourinary   Musculoskeletal negative musculoskeletal ROS (+)   Abdominal   Peds negative pediatric ROS (+)  Hematology negative hematology ROS (+)   Anesthesia Other Findings   Reproductive/Obstetrics negative OB ROS                           Anesthesia Physical Anesthesia Plan  ASA: III  Anesthesia Plan: General   Post-op Pain Management:    Induction: Intravenous  PONV Risk Score and Plan: 3 and Ondansetron, Dexamethasone, Midazolam and Treatment may vary due to age or medical condition  Airway Management Planned: Oral ETT  Additional Equipment:   Intra-op Plan:   Post-operative Plan: Extubation in OR  Informed Consent: I have reviewed the patients History and Physical, chart, labs and discussed the procedure including the risks, benefits and alternatives for the proposed anesthesia with the patient or authorized representative who has indicated his/her understanding and acceptance.     Dental advisory given  Plan Discussed with: CRNA and Surgeon  Anesthesia Plan Comments: (PAT note by Karoline Caldwell, PA-C: Follows with cardiology for hx of CAD/Ascending TAA  (s/p Bentall with 23 mm pericardial AVR with CABGx2: SVG to LAD and CX Marginal due to issue with LM coronary button 01/04/16 [facility] in [place], Dr. Matthias Hughs; s/p redo median sternotomy, sterile abscess removal from Dacron graft, omental flap around aorta, CABGx1: LIMA-LAD on 03/13/17 [name], Dr. 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  Last seen by Dr. Marlou Porch 11/06/19, stable at that time, recommended 1 year followup.   Cardiac clearance per telephone encounter 07/26/20, "Chart reviewed and patient contacted today by phone as part of pre-operative protocol coverage. Given past medical history and time since last visit, based on ACC/AHA guidelines, Erik Salazar would be at acceptable risk for the planned procedure without further cardiovascular testing. OK to hold aspirin 5 days pre op if needed."  Hx of CVA and seizures followed by neurology. Last seen 01/13/20, per note he had a recurrent seizure July 2020 while tapering Keppra, no recurrent seizure since, is continued on Keppra 750mg  twice daily.   Recently underwent C3-6 ACDF 3/41/96 without complication.   Preop labs reviewed, unremarkable.   EKG 09/05/20: Normal sinus rhythm. Rate 75. Right bundle branch block. No significant change since last tracing  CTA chest 03/09/20: IMPRESSION: 1. Stable postsurgical changes in the chest. The aortic graft is patent without complicating features. Negative for aortic dissection. 2. No acute abnormality in the chest. 3. Hyperdense cyst in left kidney upper pole. This is similar to the prior abdominal CT and probably represents a hemorrhagic or proteinaceous cyst.  TTE 09/24/17: -  Left ventricle: The cavity size was normal. There was mild focal  basal hypertrophy of the septum. Systolic function was normal.  The estimated ejection fraction was in the range of 60% to 65%.  Wall  motion was normal; there were no regional wall motion  abnormalities. Doppler parameters are equivocal for elevated mean  left atrial filling pressure.  - Ventricular septum: Septal motion showed paradox. These changes  are consistent with a post-thoracotomy state.  - Aortic valve: A bioprosthetic prosthesis was present and  functioning normally.  - Mitral valve: Calcified annulus. Mildly thickened leaflets .  Systolic bowing without prolapse.  - Left atrium: The atrium was mildly dilated.  - Pulmonary arteries: Systolic pressure was mildly increased. PA  peak pressure: 34 mm Hg (S). )       Anesthesia Quick Evaluation

## 2020-10-07 NOTE — Progress Notes (Signed)
Anesthesia Chart Review:  Follows with cardiology for hx of CAD/Ascending TAA (s/p Bentall with 23 mm pericardial AVR with CABGx2: SVG to LAD and CX Marginal due to issue with LM coronary button 01/04/16 [facility] in [place], Dr. Matthias Hughs; s/p redo median sternotomy, sterile abscess removal from Dacron graft, omental flap around aorta, CABGx1: LIMA-LAD on 03/13/17 [name], Dr. 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  Last seen by Dr. Marlou Porch 11/06/19, stable at that time, recommended 1 year followup.   Cardiac clearance per telephone encounter 07/26/20, "Chart reviewed and patient contacted today by phone as part of pre-operative protocol coverage. Given past medical history and time since last visit, based on ACC/AHA guidelines, Erik Salazar would be at acceptable risk for the planned procedure without further cardiovascular testing. OK to hold aspirin 5 days pre op if needed."  Hx of CVA and seizures followed by neurology. Last seen 01/13/20, per note he had a recurrent seizure July 2020 while tapering Keppra, no recurrent seizure since, is continued on Keppra 750mg  twice daily.   Recently underwent C3-6 ACDF 2/59/56 without complication.   Preop labs reviewed, unremarkable.   EKG 09/05/20: Normal sinus rhythm. Rate 75. Right bundle branch block. No significant change since last tracing  CTA chest 03/09/20: IMPRESSION: 1. Stable postsurgical changes in the chest. The aortic graft is patent without complicating features. Negative for aortic dissection. 2. No acute abnormality in the chest. 3. Hyperdense cyst in left kidney upper pole. This is similar to the prior abdominal CT and probably represents a hemorrhagic or proteinaceous cyst.  TTE 09/24/17: - Left ventricle: The cavity size was normal. There was mild focal  basal hypertrophy of the septum. Systolic function was normal.   The estimated ejection fraction was in the range of 60% to 65%.  Wall motion was normal; there were no regional wall motion  abnormalities. Doppler parameters are equivocal for elevated mean  left atrial filling pressure.  - Ventricular septum: Septal motion showed paradox. These changes  are consistent with a post-thoracotomy state.  - Aortic valve: A bioprosthetic prosthesis was present and  functioning normally.  - Mitral valve: Calcified annulus. Mildly thickened leaflets .  Systolic bowing without prolapse.  - Left atrium: The atrium was mildly dilated.  - Pulmonary arteries: Systolic pressure was mildly increased. PA  peak pressure: 34 mm Hg (S).    Erik Salazar [facility] Short Stay Center/Anesthesiology Phone [phone] 10/07/2020 2:03 PM

## 2020-10-08 ENCOUNTER — Other Ambulatory Visit (HOSPITAL_COMMUNITY)
Admission: RE | Admit: 2020-10-08 | Discharge: 2020-10-08 | Disposition: A | Discharge status: Home / Self Care | Payer: Medicare HMO | Source: Ambulatory Visit | Attending: Surgery | Admitting: Surgery

## 2020-10-08 DIAGNOSIS — Z20822 Contact with and (suspected) exposure to covid-19: Secondary | ICD-10-CM | POA: Diagnosis not present

## 2020-10-08 DIAGNOSIS — Z01812 Encounter for preprocedural laboratory examination: Secondary | ICD-10-CM | POA: Diagnosis present

## 2020-10-08 LAB — SARS CORONAVIRUS 2 (TAT 6-24 HRS): SARS Coronavirus 2: NEGATIVE

## 2020-10-11 MED ORDER — BUPIVACAINE LIPOSOME 1.3 % IJ SUSP
20.0000 mL | Freq: Once | INTRAMUSCULAR | Status: DC
  Filled 2020-10-11: qty 20

## 2020-10-12 ENCOUNTER — Other Ambulatory Visit: Payer: Self-pay

## 2020-10-12 ENCOUNTER — Encounter (HOSPITAL_COMMUNITY)
Admission: RE | Disposition: A | Discharge status: Home / Self Care | Payer: Self-pay | Source: Home / Self Care | Attending: Surgery

## 2020-10-12 ENCOUNTER — Encounter (HOSPITAL_COMMUNITY): Payer: Self-pay | Admitting: Surgery

## 2020-10-12 ENCOUNTER — Ambulatory Visit (HOSPITAL_COMMUNITY)
Admission: RE | Admit: 2020-10-12 | Discharge: 2020-10-12 | Disposition: A | Discharge status: Home / Self Care | Payer: Medicare HMO | Attending: Surgery | Admitting: Surgery

## 2020-10-12 DIAGNOSIS — Z539 Procedure and treatment not carried out, unspecified reason: Secondary | ICD-10-CM | POA: Insufficient documentation

## 2020-10-12 DIAGNOSIS — K436 Other and unspecified ventral hernia with obstruction, without gangrene: Secondary | ICD-10-CM | POA: Diagnosis present

## 2020-10-12 DIAGNOSIS — K430 Incisional hernia with obstruction, without gangrene: Secondary | ICD-10-CM | POA: Insufficient documentation

## 2020-10-12 SURGERY — REPAIR, HERNIA, VENTRAL, LAPAROSCOPIC
Anesthesia: General

## 2020-10-12 MED ORDER — FENTANYL CITRATE (PF) 250 MCG/5ML IJ SOLN
INTRAMUSCULAR | Status: AC
  Filled 2020-10-12: qty 5

## 2020-10-12 MED ORDER — ORAL CARE MOUTH RINSE: 15.0000 mL | Freq: Once | OROMUCOSAL | Status: AC

## 2020-10-12 MED ORDER — ACETAMINOPHEN 500 MG PO TABS
ORAL_TABLET | ORAL | Status: AC
  Administered 2020-10-12: 1000 mg via ORAL
  Filled 2020-10-12: qty 2

## 2020-10-12 MED ORDER — PROPOFOL 10 MG/ML IV BOLUS
INTRAVENOUS | Status: AC
  Filled 2020-10-12: qty 20

## 2020-10-12 MED ORDER — LACTATED RINGERS IV SOLN: INTRAVENOUS | Status: DC

## 2020-10-12 MED ORDER — GABAPENTIN 300 MG PO CAPS: 300.0000 mg | ORAL_CAPSULE | ORAL | Status: AC

## 2020-10-12 MED ORDER — MIDAZOLAM HCL 2 MG/2ML IJ SOLN
INTRAMUSCULAR | Status: AC
  Filled 2020-10-12: qty 2

## 2020-10-12 MED ORDER — LEVETIRACETAM 750 MG PO TABS
750.0000 mg | ORAL_TABLET | Freq: Once | ORAL | Status: DC
  Filled 2020-10-12: qty 1

## 2020-10-12 MED ORDER — ACETAMINOPHEN 500 MG PO TABS: 1000.0000 mg | ORAL_TABLET | ORAL | Status: AC

## 2020-10-12 MED ORDER — CHLORHEXIDINE GLUCONATE 0.12 % MT SOLN
15.0000 mL | Freq: Once | OROMUCOSAL | Status: AC
  Administered 2020-10-12: 15 mL via OROMUCOSAL
  Filled 2020-10-12: qty 15

## 2020-10-12 MED ORDER — GABAPENTIN 300 MG PO CAPS
ORAL_CAPSULE | ORAL | Status: AC
  Administered 2020-10-12: 300 mg via ORAL
  Filled 2020-10-12: qty 1

## 2020-10-12 MED ORDER — CEFAZOLIN SODIUM-DEXTROSE 2-4 GM/100ML-% IV SOLN: 2.0000 g | INTRAVENOUS | Status: DC

## 2020-10-12 MED ORDER — CEFAZOLIN SODIUM-DEXTROSE 2-4 GM/100ML-% IV SOLN
INTRAVENOUS | Status: AC
  Filled 2020-10-12: qty 100

## 2020-10-12 MED ORDER — CHLORHEXIDINE GLUCONATE CLOTH 2 % EX PADS: 6.0000 | MEDICATED_PAD | Freq: Once | CUTANEOUS | Status: DC

## 2020-10-12 MED ORDER — ENSURE PRE-SURGERY PO LIQD: 296.0000 mL | Freq: Once | ORAL | Status: DC

## 2020-10-12 MED ORDER — ENSURE PRE-SURGERY PO LIQD: 592.0000 mL | Freq: Once | ORAL | Status: DC

## 2020-10-13 ENCOUNTER — Other Ambulatory Visit (HOSPITAL_COMMUNITY)
Admission: RE | Admit: 2020-10-13 | Discharge: 2020-10-13 | Disposition: A | Discharge status: Home / Self Care | Payer: Medicare HMO | Source: Ambulatory Visit | Attending: Surgery | Admitting: Surgery

## 2020-10-13 ENCOUNTER — Other Ambulatory Visit (HOSPITAL_COMMUNITY): Payer: Medicare HMO

## 2020-10-13 DIAGNOSIS — Z20822 Contact with and (suspected) exposure to covid-19: Secondary | ICD-10-CM | POA: Insufficient documentation

## 2020-10-13 DIAGNOSIS — Z01812 Encounter for preprocedural laboratory examination: Secondary | ICD-10-CM | POA: Insufficient documentation

## 2020-10-13 LAB — SARS CORONAVIRUS 2 (TAT 6-24 HRS): SARS Coronavirus 2: NEGATIVE

## 2020-10-14 ENCOUNTER — Ambulatory Visit (HOSPITAL_COMMUNITY): Payer: Medicare HMO | Admitting: Certified Registered"

## 2020-10-14 ENCOUNTER — Other Ambulatory Visit: Payer: Self-pay

## 2020-10-14 ENCOUNTER — Ambulatory Visit (HOSPITAL_COMMUNITY)
Admission: RE | Admit: 2020-10-14 | Discharge: 2020-10-14 | Disposition: A | Discharge status: Home / Self Care | Payer: Medicare HMO | Attending: Surgery | Admitting: Surgery

## 2020-10-14 ENCOUNTER — Encounter (HOSPITAL_COMMUNITY)
Admission: RE | Disposition: A | Discharge status: Home / Self Care | Payer: Self-pay | Source: Home / Self Care | Attending: Surgery

## 2020-10-14 ENCOUNTER — Encounter (HOSPITAL_COMMUNITY): Payer: Self-pay | Admitting: Surgery

## 2020-10-14 DIAGNOSIS — Z951 Presence of aortocoronary bypass graft: Secondary | ICD-10-CM | POA: Diagnosis not present

## 2020-10-14 DIAGNOSIS — Z952 Presence of prosthetic heart valve: Secondary | ICD-10-CM | POA: Diagnosis not present

## 2020-10-14 DIAGNOSIS — K660 Peritoneal adhesions (postprocedural) (postinfection): Secondary | ICD-10-CM | POA: Diagnosis not present

## 2020-10-14 DIAGNOSIS — G8929 Other chronic pain: Secondary | ICD-10-CM | POA: Diagnosis present

## 2020-10-14 DIAGNOSIS — K432 Incisional hernia without obstruction or gangrene: Secondary | ICD-10-CM | POA: Diagnosis present

## 2020-10-14 DIAGNOSIS — K219 Gastro-esophageal reflux disease without esophagitis: Secondary | ICD-10-CM | POA: Insufficient documentation

## 2020-10-14 DIAGNOSIS — Z9889 Other specified postprocedural states: Secondary | ICD-10-CM

## 2020-10-14 DIAGNOSIS — M6208 Separation of muscle (nontraumatic), other site: Secondary | ICD-10-CM | POA: Diagnosis not present

## 2020-10-14 DIAGNOSIS — K436 Other and unspecified ventral hernia with obstruction, without gangrene: Secondary | ICD-10-CM | POA: Diagnosis not present

## 2020-10-14 DIAGNOSIS — I719 Aortic aneurysm of unspecified site, without rupture: Secondary | ICD-10-CM | POA: Insufficient documentation

## 2020-10-14 DIAGNOSIS — Q394 Esophageal web: Secondary | ICD-10-CM | POA: Diagnosis not present

## 2020-10-14 DIAGNOSIS — K430 Incisional hernia with obstruction, without gangrene: Secondary | ICD-10-CM | POA: Diagnosis not present

## 2020-10-14 DIAGNOSIS — K449 Diaphragmatic hernia without obstruction or gangrene: Secondary | ICD-10-CM | POA: Insufficient documentation

## 2020-10-14 DIAGNOSIS — I712 Thoracic aortic aneurysm, without rupture, unspecified: Secondary | ICD-10-CM | POA: Insufficient documentation

## 2020-10-14 DIAGNOSIS — Z885 Allergy status to narcotic agent status: Secondary | ICD-10-CM | POA: Insufficient documentation

## 2020-10-14 DIAGNOSIS — I2510 Atherosclerotic heart disease of native coronary artery without angina pectoris: Secondary | ICD-10-CM | POA: Diagnosis not present

## 2020-10-14 DIAGNOSIS — Z87891 Personal history of nicotine dependence: Secondary | ICD-10-CM | POA: Insufficient documentation

## 2020-10-14 DIAGNOSIS — Z888 Allergy status to other drugs, medicaments and biological substances status: Secondary | ICD-10-CM | POA: Diagnosis not present

## 2020-10-14 DIAGNOSIS — N400 Enlarged prostate without lower urinary tract symptoms: Secondary | ICD-10-CM | POA: Insufficient documentation

## 2020-10-14 DIAGNOSIS — N529 Male erectile dysfunction, unspecified: Secondary | ICD-10-CM | POA: Insufficient documentation

## 2020-10-14 DIAGNOSIS — Z953 Presence of xenogenic heart valve: Secondary | ICD-10-CM

## 2020-10-14 DIAGNOSIS — R1319 Other dysphagia: Secondary | ICD-10-CM | POA: Insufficient documentation

## 2020-10-14 DIAGNOSIS — Z8679 Personal history of other diseases of the circulatory system: Secondary | ICD-10-CM

## 2020-10-14 HISTORY — PX: LYSIS OF ADHESION: SHX5961

## 2020-10-14 HISTORY — PX: INSERTION OF MESH: SHX5868

## 2020-10-14 HISTORY — PX: VENTRAL HERNIA REPAIR: SHX424

## 2020-10-14 SURGERY — REPAIR, HERNIA, VENTRAL, LAPAROSCOPIC
Anesthesia: General | Site: Abdomen

## 2020-10-14 MED ORDER — ROCURONIUM BROMIDE 10 MG/ML (PF) SYRINGE
PREFILLED_SYRINGE | INTRAVENOUS | Status: DC | PRN
  Administered 2020-10-14 (×2): 20 mg via INTRAVENOUS
  Administered 2020-10-14: 50 mg via INTRAVENOUS

## 2020-10-14 MED ORDER — CHLORHEXIDINE GLUCONATE CLOTH 2 % EX PADS: 6.0000 | MEDICATED_PAD | Freq: Once | CUTANEOUS | Status: DC

## 2020-10-14 MED ORDER — PROPOFOL 500 MG/50ML IV EMUL
INTRAVENOUS | Status: DC | PRN
  Administered 2020-10-14: 25 ug/kg/min via INTRAVENOUS

## 2020-10-14 MED ORDER — PHENYLEPHRINE HCL (PRESSORS) 10 MG/ML IV SOLN
INTRAVENOUS | Status: DC | PRN
  Administered 2020-10-14: 80 ug via INTRAVENOUS

## 2020-10-14 MED ORDER — ORAL CARE MOUTH RINSE: 15.0000 mL | Freq: Once | OROMUCOSAL | Status: DC

## 2020-10-14 MED ORDER — LACTATED RINGERS IV SOLN: INTRAVENOUS | Status: DC

## 2020-10-14 MED ORDER — KETOROLAC TROMETHAMINE 30 MG/ML IJ SOLN: 30.0000 mg | Freq: Once | INTRAMUSCULAR | Status: DC | PRN

## 2020-10-14 MED ORDER — GABAPENTIN 300 MG PO CAPS: 300.0000 mg | ORAL_CAPSULE | ORAL | Status: DC

## 2020-10-14 MED ORDER — PHENYLEPHRINE HCL-NACL 10-0.9 MG/250ML-% IV SOLN
INTRAVENOUS | Status: DC | PRN
  Administered 2020-10-14: 40 ug/min via INTRAVENOUS

## 2020-10-14 MED ORDER — ONDANSETRON HCL 4 MG/2ML IJ SOLN
INTRAMUSCULAR | Status: DC | PRN
  Administered 2020-10-14 (×2): 4 mg via INTRAVENOUS

## 2020-10-14 MED ORDER — GABAPENTIN 300 MG PO CAPS
ORAL_CAPSULE | ORAL | Status: AC
  Administered 2020-10-14: 300 mg via ORAL
  Filled 2020-10-14: qty 1

## 2020-10-14 MED ORDER — ORAL CARE MOUTH RINSE: 15.0000 mL | Freq: Once | OROMUCOSAL | Status: AC

## 2020-10-14 MED ORDER — SCOPOLAMINE 1 MG/3DAYS TD PT72: 1.0000 | MEDICATED_PATCH | TRANSDERMAL | Status: DC

## 2020-10-14 MED ORDER — FENTANYL CITRATE (PF) 250 MCG/5ML IJ SOLN
INTRAMUSCULAR | Status: AC
  Filled 2020-10-14: qty 5

## 2020-10-14 MED ORDER — PROPOFOL 10 MG/ML IV BOLUS
INTRAVENOUS | Status: AC
  Filled 2020-10-14: qty 20

## 2020-10-14 MED ORDER — GABAPENTIN 300 MG PO CAPS: 300.0000 mg | ORAL_CAPSULE | Freq: Once | ORAL | Status: AC

## 2020-10-14 MED ORDER — PROPOFOL 10 MG/ML IV BOLUS
INTRAVENOUS | Status: DC | PRN
  Administered 2020-10-14: 120 mg via INTRAVENOUS

## 2020-10-14 MED ORDER — ACETAMINOPHEN 500 MG PO TABS: 1000.0000 mg | ORAL_TABLET | ORAL | Status: DC

## 2020-10-14 MED ORDER — FENTANYL CITRATE (PF) 100 MCG/2ML IJ SOLN
INTRAMUSCULAR | Status: DC | PRN
  Administered 2020-10-14: 100 ug via INTRAVENOUS
  Administered 2020-10-14: 50 ug via INTRAVENOUS

## 2020-10-14 MED ORDER — ONDANSETRON HCL 4 MG/2ML IJ SOLN: 4.0000 mg | Freq: Once | INTRAMUSCULAR | Status: DC | PRN

## 2020-10-14 MED ORDER — SCOPOLAMINE 1 MG/3DAYS TD PT72
MEDICATED_PATCH | TRANSDERMAL | Status: AC
  Administered 2020-10-14: 1.5 mg via TRANSDERMAL
  Filled 2020-10-14: qty 1

## 2020-10-14 MED ORDER — CHLORHEXIDINE GLUCONATE 0.12 % MT SOLN: 15.0000 mL | Freq: Once | OROMUCOSAL | Status: DC

## 2020-10-14 MED ORDER — CEFAZOLIN SODIUM-DEXTROSE 2-4 GM/100ML-% IV SOLN
2.0000 g | INTRAVENOUS | Status: AC
  Administered 2020-10-14: 2 g via INTRAVENOUS

## 2020-10-14 MED ORDER — MIDAZOLAM HCL 2 MG/2ML IJ SOLN
INTRAMUSCULAR | Status: AC
  Filled 2020-10-14: qty 2

## 2020-10-14 MED ORDER — KETOROLAC TROMETHAMINE 30 MG/ML IJ SOLN
INTRAMUSCULAR | Status: DC | PRN
  Administered 2020-10-14: 30 mg via INTRAVENOUS

## 2020-10-14 MED ORDER — AMISULPRIDE (ANTIEMETIC) 5 MG/2ML IV SOLN: 10.0000 mg | Freq: Once | INTRAVENOUS | Status: DC | PRN

## 2020-10-14 MED ORDER — KETAMINE HCL 10 MG/ML IJ SOLN
INTRAMUSCULAR | Status: DC | PRN
  Administered 2020-10-14: 50 mg via INTRAVENOUS

## 2020-10-14 MED ORDER — ACETAMINOPHEN 500 MG PO TABS
ORAL_TABLET | ORAL | Status: AC
  Administered 2020-10-14: 1000 mg via ORAL
  Filled 2020-10-14: qty 2

## 2020-10-14 MED ORDER — TRAMADOL HCL 50 MG PO TABS: 50.0000 mg | ORAL_TABLET | Freq: Four times a day (QID) | ORAL | 0 refills | Status: DC | PRN

## 2020-10-14 MED ORDER — 0.9 % SODIUM CHLORIDE (POUR BTL) OPTIME
TOPICAL | Status: DC | PRN
  Administered 2020-10-14: 1000 mL

## 2020-10-14 MED ORDER — LIDOCAINE 2% (20 MG/ML) 5 ML SYRINGE
INTRAMUSCULAR | Status: DC | PRN
  Administered 2020-10-14: 60 mg via INTRAVENOUS

## 2020-10-14 MED ORDER — BUPIVACAINE-EPINEPHRINE (PF) 0.25% -1:200000 IJ SOLN
INTRAMUSCULAR | Status: AC
  Filled 2020-10-14: qty 60

## 2020-10-14 MED ORDER — BUPIVACAINE LIPOSOME 1.3 % IJ SUSP
20.0000 mL | Freq: Once | INTRAMUSCULAR | Status: AC
  Administered 2020-10-14: 20 mL
  Filled 2020-10-14: qty 20

## 2020-10-14 MED ORDER — BUPIVACAINE-EPINEPHRINE (PF) 0.25% -1:200000 IJ SOLN
INTRAMUSCULAR | Status: DC | PRN
  Administered 2020-10-14: 60 mL via PERINEURAL

## 2020-10-14 MED ORDER — MIDAZOLAM HCL 5 MG/5ML IJ SOLN
INTRAMUSCULAR | Status: DC | PRN
  Administered 2020-10-14: 1 mg via INTRAVENOUS

## 2020-10-14 MED ORDER — BUPIVACAINE HCL (PF) 0.25 % IJ SOLN
INTRAMUSCULAR | Status: AC
  Filled 2020-10-14: qty 30

## 2020-10-14 MED ORDER — SUGAMMADEX SODIUM 200 MG/2ML IV SOLN
INTRAVENOUS | Status: DC | PRN
  Administered 2020-10-14: 400 mg via INTRAVENOUS

## 2020-10-14 MED ORDER — SODIUM CHLORIDE 0.9 % IR SOLN
Status: DC | PRN
  Administered 2020-10-14: 1000 mL

## 2020-10-14 MED ORDER — ACETAMINOPHEN 500 MG PO TABS: 1000.0000 mg | ORAL_TABLET | Freq: Once | ORAL | Status: AC

## 2020-10-14 MED ORDER — METHOCARBAMOL 750 MG PO TABS: 750.0000 mg | ORAL_TABLET | Freq: Four times a day (QID) | ORAL | 2 refills | Status: DC | PRN

## 2020-10-14 MED ORDER — FENTANYL CITRATE (PF) 100 MCG/2ML IJ SOLN: 25.0000 ug | INTRAMUSCULAR | Status: DC | PRN

## 2020-10-14 MED ORDER — KETAMINE HCL 50 MG/5ML IJ SOSY
PREFILLED_SYRINGE | INTRAMUSCULAR | Status: AC
  Filled 2020-10-14: qty 5

## 2020-10-14 MED ORDER — CHLORHEXIDINE GLUCONATE 0.12 % MT SOLN
15.0000 mL | Freq: Once | OROMUCOSAL | Status: AC
  Administered 2020-10-14: 15 mL via OROMUCOSAL
  Filled 2020-10-14: qty 15

## 2020-10-14 MED ORDER — DEXAMETHASONE SODIUM PHOSPHATE 10 MG/ML IJ SOLN
INTRAMUSCULAR | Status: DC | PRN
  Administered 2020-10-14: 10 mg via INTRAVENOUS

## 2020-10-14 SURGICAL SUPPLY — 56 items
APPLICATOR COTTON TIP 6 STRL (MISCELLANEOUS) IMPLANT
APPLICATOR COTTON TIP 6IN STRL (MISCELLANEOUS)
APPLIER CLIP LOGIC TI 5 (MISCELLANEOUS) IMPLANT
BINDER ABDOMINAL 12 ML 46-62 (SOFTGOODS) ×3 IMPLANT
BLADE CLIPPER SURG (BLADE) ×3 IMPLANT
CANISTER SUCT 3000ML PPV (MISCELLANEOUS) ×3 IMPLANT
CHLORAPREP W/TINT 26 (MISCELLANEOUS) ×3 IMPLANT
COVER SURGICAL LIGHT HANDLE (MISCELLANEOUS) ×3 IMPLANT
COVER WAND RF STERILE (DRAPES) ×3 IMPLANT
DEVICE SECURE STRAP 25 ABSORB (INSTRUMENTS) ×3 IMPLANT
DEVICE TROCAR PUNCTURE CLOSURE (ENDOMECHANICALS) ×3 IMPLANT
DRSG TEGADERM 2-3/8X2-3/4 SM (GAUZE/BANDAGES/DRESSINGS) ×9 IMPLANT
DRSG TEGADERM 4X4.75 (GAUZE/BANDAGES/DRESSINGS) ×3 IMPLANT
ELECT REM PT RETURN 9FT ADLT (ELECTROSURGICAL) ×3
ELECTRODE REM PT RTRN 9FT ADLT (ELECTROSURGICAL) ×2 IMPLANT
GAUZE SPONGE 2X2 8PLY STRL LF (GAUZE/BANDAGES/DRESSINGS) ×2 IMPLANT
GLOVE ECLIPSE 8.0 STRL XLNG CF (GLOVE) ×3 IMPLANT
GLOVE INDICATOR 8.0 STRL GRN (GLOVE) ×3 IMPLANT
GOWN STRL REUS W/ TWL LRG LVL3 (GOWN DISPOSABLE) ×4 IMPLANT
GOWN STRL REUS W/ TWL XL LVL3 (GOWN DISPOSABLE) ×2 IMPLANT
GOWN STRL REUS W/TWL LRG LVL3 (GOWN DISPOSABLE) ×2
GOWN STRL REUS W/TWL XL LVL3 (GOWN DISPOSABLE) ×1
IRRIGATION STRYKERFLOW (MISCELLANEOUS) ×2 IMPLANT
IRRIGATOR STRYKERFLOW (MISCELLANEOUS) ×3
KIT BASIN OR (CUSTOM PROCEDURE TRAY) ×3 IMPLANT
KIT TURNOVER KIT B (KITS) ×3 IMPLANT
MARKER SKIN DUAL TIP RULER LAB (MISCELLANEOUS) ×3 IMPLANT
MESH VENTRALIGHT ST 8X10 (Mesh General) ×3 IMPLANT
NEEDLE 22X1 1/2 (OR ONLY) (NEEDLE) ×6 IMPLANT
NEEDLE INSUFFLATION 14GA 120MM (NEEDLE) ×3 IMPLANT
NEEDLE SPNL 22GX3.5 QUINCKE BK (NEEDLE) ×3 IMPLANT
NS IRRIG 1000ML POUR BTL (IV SOLUTION) ×6 IMPLANT
PAD ARMBOARD 7.5X6 YLW CONV (MISCELLANEOUS) ×6 IMPLANT
SCISSORS LAP 5X35 DISP (ENDOMECHANICALS) ×3 IMPLANT
SET IRRIG TUBING LAPAROSCOPIC (IRRIGATION / IRRIGATOR) IMPLANT
SET TUBE SMOKE EVAC HIGH FLOW (TUBING) ×3 IMPLANT
SHEARS HARMONIC ACE PLUS 36CM (ENDOMECHANICALS) IMPLANT
SLEEVE ENDOPATH XCEL 5M (ENDOMECHANICALS) ×3 IMPLANT
SPONGE GAUZE 2X2 STER 10/PKG (GAUZE/BANDAGES/DRESSINGS) ×1
STRIP CLOSURE SKIN 1/2X4 (GAUZE/BANDAGES/DRESSINGS) ×3 IMPLANT
SUT MNCRL AB 4-0 PS2 18 (SUTURE) ×6 IMPLANT
SUT PDS AB 1 CT  36 (SUTURE) ×6
SUT PDS AB 1 CT 36 (SUTURE) ×12 IMPLANT
SUT PROLENE 1 CT (SUTURE) ×15 IMPLANT
SUT VICRYL 0 UR6 27IN ABS (SUTURE) IMPLANT
SYR 50ML LL SCALE MARK (SYRINGE) ×3 IMPLANT
TACKER 5MM HERNIA 3.5CML NAB (ENDOMECHANICALS) IMPLANT
TOWEL GREEN STERILE (TOWEL DISPOSABLE) ×3 IMPLANT
TOWEL GREEN STERILE FF (TOWEL DISPOSABLE) ×3 IMPLANT
TRAY FOLEY W/BAG SLVR 16FR (SET/KITS/TRAYS/PACK)
TRAY FOLEY W/BAG SLVR 16FR ST (SET/KITS/TRAYS/PACK) IMPLANT
TRAY LAPAROSCOPIC MC (CUSTOM PROCEDURE TRAY) ×3 IMPLANT
TROCAR ADV FIXATION 5X100MM (TROCAR) IMPLANT
TROCAR XCEL NON-BLD 11X100MML (ENDOMECHANICALS) ×3 IMPLANT
TROCAR XCEL NON-BLD 5MMX100MML (ENDOMECHANICALS) ×3 IMPLANT
WATER STERILE IRR 1000ML POUR (IV SOLUTION) ×3 IMPLANT

## 2020-10-14 NOTE — H&P (Signed)
Erik Salazar DOB: January 19, 1951 Married / Language: English / Race: White Male  Patient Care Team: Pa, [place] as PCP - General (Family Medicine) Jerline Pain, MD as PCP - Cardiology (Cardiology) Rolene Course, PA-C as Physician Assistant (Internal Medicine) Prescott Gum, Collier Salina, MD (Inactive) as Consulting Physician (Cardiothoracic Surgery) Otis Brace, MD as Consulting Physician (Gastroenterology)  ` ` Patient sent for surgical consultation at the request of Dr Ovidio Hanger MD  Chief Complaint: Hernia ` ` The patient is a pleasant gentleman with history of stable coronary disease. Felt like he had a coronary bypass grafting with valve aortic arch surgery when he was up in [place] 2017. He relocated [place] 2018. Had to have a redo bypass surgery at [facility]. Had have omental pedicle flap to help close the leave the pericardium. It is no longer on any more aggressive anticoagulation aside from aspirin. Noted some bulging in his upper abdomen. Does not know if this is a hiatal hernia or incisional hernia. Discuss with his primary care physician. Surgical consultation offered. Patient notes he gets some bulging just below his breast bone in the epigastric region. Does have some discomfort with the sternum occasionally with lifting or twisting but that is not changed much. Used to smoke but has not done that in many years. He walks on a treadmill and does 5K every day. We will his bowels about 3 times a day. Recalls being diagnosed with a stroke and is on antiseizure medicines. Occasionally has some memory issues. Wife helps take care of him. He denies any heartburn or reflux recalls being told he had a hiatal hernia and had a tight. The needed to be dilated. Looking at records he's followed by [facility] gastroenterology. Dr. Alessandra Bevels. Last endoscopy November 2020 around Schatzki's ring. Very small hiatal hernia noted on  anoscopy.  No personal nor family history of GI/colon cancer, inflammatory bowel disease, irritable bowel syndrome, allergy such as Celiac Sprue, dietary/dairy problems, colitis, ulcers nor gastritis. No recent sick contacts/gastroenteritis. No travel outside the country. No changes in diet. No dysphagia to solids or liquids. No significant heartburn or reflux. No melena, hematemesis, coffee ground emesis. No evidence of prior gastric/peptic ulceration. Denies any history of any skin infections.  (Review of systems as stated in this history (HPI) or in the review of systems. Otherwise all other 12 point ROS are negative) ` ` ###########################################`  This patient encounter took 50 minutes today to perform the following: obtain history, perform exam, review outside records, interpret tests & imaging, counsel the patient on their diagnosis; and, document this encounter, including findings & plan in the electronic health record (EHR).   Past Surgical History [name], [place]; 07/25/2020 8:46 AM) Aneurysm Repair Bypass Surgery for Poor Blood Flow to Legs Colon Polyp Removal - Colonoscopy Coronary Artery Bypass Graft Foot Surgery Bilateral. Knee Surgery Bilateral. Oral Surgery Shoulder Surgery Right. Spinal Surgery - Neck Tonsillectomy Valve Replacement  Diagnostic Studies History [name], [place]; 07/25/2020 8:46 AM) Colonoscopy 1-5 years ago  Allergies [name], CMA; 07/25/2020 8:48 AM) Sulfur *CHEMICALS* oxyCODONE HCl *ANALGESICS - OPIOID* OxyCONTIN *ANALGESICS - OPIOID* Percocet *ANALGESICS - OPIOID* Allergies Reconciled  Medication History [name], CMA; 07/25/2020 8:51 AM) Doxycycline Monohydrate (100MG  Capsule, Oral) Active. Metoprolol Succinate ER (25MG  Tablet ER 24HR, Oral) Active. Pravastatin Sodium (40MG  Tablet, Oral) Active. Aspirin (81MG  Tablet, Oral) Active. Flomax (0.4MG  Capsule, Oral)  Active. levETIRAcetam (750MG  Tablet, Oral) Active. Medications Reconciled  Social History [name], [place]; 07/25/2020 8:46 AM) Alcohol  use Occasional alcohol use. Caffeine use Coffee. No drug use Tobacco use Never smoker.  Family History [name], [place]; 07/25/2020 8:46 AM) Arthritis Father, Mother, Sister. Ischemic Bowel Disease Mother. Migraine Headache Mother.  Other Problems [name], CMA; 07/25/2020 8:46 AM) Arthritis Back Pain Cerebrovascular Accident Heart murmur Hemorrhoids High blood pressure Seizure Disorder Transfusion history Ulcerative Colitis     Review of Systems [name] CMA; 07/25/2020 8:46 AM) General Not Present- Appetite Loss, Chills, Fatigue, Fever, Night Sweats, Weight Gain and Weight Loss. Skin Not Present- Change in Wart/Mole, Dryness, Hives, Jaundice, New Lesions, Non-Healing Wounds, Rash and Ulcer. HEENT Not Present- Earache, Hearing Loss, Hoarseness, Nose Bleed, Oral Ulcers, Ringing in the Ears, Seasonal Allergies, Sinus Pain, Sore Throat, Visual Disturbances, Wears glasses/contact lenses and Yellow Eyes. Respiratory Not Present- Bloody sputum, Chronic Cough, Difficulty Breathing, Snoring and Wheezing. Breast Not Present- Breast Mass, Breast Pain, Nipple Discharge and Skin Changes. Cardiovascular Not Present- Chest Pain, Difficulty Breathing Lying Down, Leg Cramps, Palpitations, Rapid Heart Rate, Shortness of Breath and Swelling of Extremities. Gastrointestinal Present- Difficulty Swallowing. Not Present- Abdominal Pain, Bloating, Bloody Stool, Change in Bowel Habits, Chronic diarrhea, Constipation, Excessive gas, Gets full quickly at meals, Hemorrhoids, Indigestion, Nausea, Rectal Pain and Vomiting. Male Genitourinary Not Present- Blood in Urine, Change in Urinary Stream, Frequency, Impotence, Nocturia, Painful Urination, Urgency and Urine Leakage. Musculoskeletal Present- Back Pain, Joint Pain, Joint  Stiffness and Muscle Pain. Not Present- Muscle Weakness and Swelling of Extremities. Neurological Present- Decreased Memory and Tremor. Not Present- Fainting, Headaches, Numbness, Seizures, Tingling, Trouble walking and Weakness. Psychiatric Not Present- Anxiety, Bipolar, Change in Sleep Pattern, Depression, Fearful and Frequent crying. Endocrine Not Present- Cold Intolerance, Excessive Hunger, Hair Changes, Heat Intolerance, Hot flashes and New Diabetes. Hematology Not Present- Blood Thinners, Easy Bruising, Excessive bleeding, Gland problems, HIV and Persistent Infections.  Vitals [name] CMA; 07/25/2020 8:51 AM) 07/25/2020 8:51 AM Weight: 202.25 lb Height: 70in Body Surface Area: 2.1 m Body Mass Index: 29.02 kg/m  Temp.: 98.85F  Pulse: 84 (Regular)  BP: 136/82(Sitting, Left Arm, Standard)     BP (!) 125/29   Pulse 94   Temp 98.3 F (36.8 C) (Oral)   Resp (!) 89   Ht 5\' 10"  (1.778 m)   Wt 90.7 kg   SpO2 98%   BMI 28.69 kg/m  10/14/2020    Physical Exam [name]; 07/25/2020 9:13 AM)  General Mental Status-Alert. General Appearance-Not in acute distress, Not Sickly. Orientation-Oriented X3. Hydration-Well hydrated. Voice-Normal.  Integumentary Global Assessment Upon inspection and palpation of skin surfaces of the - Axillae: non-tender, no inflammation or ulceration, no drainage. and Distribution of scalp and body hair is normal. General Characteristics Temperature - normal warmth is noted.  Head and Neck Head-normocephalic, atraumatic with no lesions or palpable masses. Face Global Assessment - atraumatic, no absence of expression. Neck Global Assessment - no abnormal movements, no bruit auscultated on the right, no bruit auscultated on the left, no decreased range of motion, non-tender. Trachea-midline. Thyroid Gland Characteristics - non-tender.  Eye Eyeball - Left-Extraocular movements intact, No  Nystagmus - Left. Eyeball - Right-Extraocular movements intact, No Nystagmus - Right. Cornea - Left-No Hazy - Left. Cornea - Right-No Hazy - Right. Sclera/Conjunctiva - Left-No scleral icterus, No Discharge - Left. Sclera/Conjunctiva - Right-No scleral icterus, No Discharge - Right. Pupil - Left-Direct reaction to light normal. Pupil - Right-Direct reaction to light normal.  ENMT Ears Pinna - Left - no drainage observed, no generalized tenderness observed. Pinna - Right -  no drainage observed, no generalized tenderness observed. Nose and Sinuses External Inspection of the Nose - no destructive lesion observed. Inspection of the nares - Left - quiet respiration. Inspection of the nares - Right - quiet respiration. Mouth and Throat Lips - Upper Lip - no fissures observed, no pallor noted. Lower Lip - no fissures observed, no pallor noted. Nasopharynx - no discharge present. Oral Cavity/Oropharynx - Tongue - no dryness observed. Oral Mucosa - no cyanosis observed. Hypopharynx - no evidence of airway distress observed.  Chest and Lung Exam Inspection Movements - Normal and Symmetrical. Accessory muscles - No use of accessory muscles in breathing. Palpation Palpation of the chest reveals - Non-tender. Auscultation Breath sounds - Normal and Clear. Note: Some chest wall tenderness to sternal pressure but no evidence of any abscess/click  Cardiovascular Auscultation Rhythm - Regular. Murmurs & Other Heart Sounds - Auscultation of the heart reveals - No Murmurs and No Systolic Clicks.  Abdomen Inspection Inspection of the abdomen reveals - No Visible peristalsis and No Abnormal pulsations. Umbilicus - No Bleeding, No Urine drainage. Palpation/Percussion Palpation and Percussion of the abdomen reveal - Soft, Non Tender, No Rebound tenderness, No Rigidity (guarding) and No Cutaneous hyperesthesia. Note: Abdomen obese and thin-walled with epigastric and left paramedian  vertical incisions. Obvious epigastric incisional hernia 6 x 5 cm region reduces down. No definite periumbilical ventral hernia.  Abdomen soft. Nontender. Not distended. No guarding.  Male Genitourinary Sexual Maturity Tanner 5 - Adult hair pattern and Adult penile size and shape. Note: No inguinal hernias. Normal external genitalia. Epididymi, testes, and spermatic cords normal without any masses.  Peripheral Vascular Upper Extremity Inspection - Left - No Cyanotic nailbeds - Left, Not Ischemic. Inspection - Right - No Cyanotic nailbeds - Right, Not Ischemic.  Neurologic Neurologic evaluation reveals -normal attention span and ability to concentrate, able to name objects and repeat phrases. Appropriate fund of knowledge , normal sensation and normal coordination. Mental Status Affect - not angry, not paranoid. Cranial Nerves-Normal Bilaterally. Gait-Normal.  Neuropsychiatric Mental status exam performed with findings of-able to articulate well with normal speech/language, rate, volume and coherence, thought content normal with ability to perform basic computations and apply abstract reasoning and no evidence of hallucinations, delusions, obsessions or homicidal/suicidal ideation.  Musculoskeletal Global Assessment Spine, Ribs and Pelvis - no instability, subluxation or laxity. Right Upper Extremity - no instability, subluxation or laxity.  Lymphatic Head & Neck  General Head & Neck Lymphatics: Bilateral - Description - No Localized lymphadenopathy. Axillary  General Axillary Region: Bilateral - Description - No Localized lymphadenopathy. Femoral & Inguinal  Generalized Femoral & Inguinal Lymphatics: Left - Description - No Localized lymphadenopathy. Right - Description - No Localized lymphadenopathy.    Assessment & Plan  INCISIONAL HERNIA, WITHOUT OBSTRUCTION OR GANGRENE (K43.2) Impression: Pleasant gentleman with, getting cardiac history. CABG  2017 in [place] with Bentall valve surgery. Aortic aneurysm repair. Redo CABG 2018 with omental pedicle flap closure at [facility].  Past epigastric incisional hernia most likely related to his prior incisions as well as his abdominal incisions. He physically getting larger more symptomatic. He would benefit from hernia repair. Reasonably laparoscopic approach with underlay mesh.  The challenge be to preserve and protect his omentum since it is a pedicle flap covering his chest last pericardium.  Would like cardiac clearance. Patient has good exercise tolerance and uses a treadmill 5K. Just got over neck surgery. However, with discomfort getting cardiac history would like Dr. Camila Li input. Okay to continue aspirin  for my standpoint.   HISTORY OF CORONARY ARTERY BYPASS GRAFT (Z95.1) Impression: History of cardiac and valvular surgery [place] with redo bypass in 2018. Seems relatively stable. Just got over neck surgery but I think it would be wise for him to get cardiac clearance.  Current Plans I recommended obtaining preoperative cardiac clearance. I am concerned about the health of the patient and the ability to tolerate the operation. Therefore, we will request clearance by cardiology to better assess operative risk & see if a reevaluation, further workup, etc is needed. Also recommendations on how medications such as for anticoagulation and blood pressure should be managed/held/restarted after surgery.  PREOP - [place] - ENCOUNTER FOR PREOPERATIVE EXAMINATION FOR GENERAL SURGICAL PROCEDURE (Z01.818)  Current Plans You are being scheduled for surgery- Our schedulers will call you.  You should hear from our office's scheduling department within 5 working days about the location, date, and time of surgery. We try to make accommodations for patient's preferences in scheduling surgery, but sometimes the OR schedule or the surgeon's schedule prevents [place] from making those  accommodations.  If you have not heard from our office [phone]) in 5 working days, call the office and ask for your surgeon's nurse.  If you have other questions about your diagnosis, plan, or surgery, call the office and ask for your surgeon's nurse.  Written instructions provided [facility] Consent - Hernia Repair - Ventral/Incisional/Umbilical (Francesco Provencal): discussed with patient and provided information. Pt Education - [facility] Hernia Post-Op HCI (Felton Buczynski): discussed with patient and provided information. Pt Education - [facility] Pain Control (Sherry Rogus) Pt Education - Pamphlet Given - Laparoscopic Hernia Repair: discussed with patient and provided information. Pt Education - [facility] Mesh education: discussed with patient and provided information.  DIASTASIS RECTI (M62.08) Impression: Upper abdominal diastases recti making the incisional hernia more likely. Therefore deftly mesh repair  Current Plans Pt Education - [facility] Diastasis Recti: discussed with patient and provided information.  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 Alessandra Bevels for what sounds like a shock sees rings less esophageal web that was balloon dilated. Patient recalls prior balloon dilatations when he lived up in [place] as well. Denies much in way of heartburn or reflux. No dysphagia at this time. His only mention a very small hiatal hernia on the EGD report and no major one noted on the chest CT. I think his concern about a possible hiatal hernia is not his major issue   HIATAL HERNIA (K44.9) Impression: Denies much in way of heartburn or reflux. No dysphagia at this time.  While the patient wondered if he had been told he had an anal hernia, the EGD from last year notes only a very small sliding 1. Certainly no major have a hernia on chest CT. Most likely a reflection of needing the omental pedicle flap to help with chest wall and pericardial closure  He is  asymptomatic and I do not think he needs any hiatal hernia surgery at this time. We'll defer to his gastrologist. Patient on he was due to see gastrology January but [facility] is not aware of this. I will defer to them.  [name], MD, FACS, MASCRS Gastrointestinal and Minimally Invasive Surgery  [facility] [address] [place], [place] [address] [phone] Fax [phone] Main/Paging  CONTACT INFORMATION: Weekday (9AM-5PM) concerns: Call [facility] main office at [phone] Weeknight (5PM-9AM) or Weekend/Holiday concerns: Check [email or web address] for General Surgery [facility] coverage (Please, do  not use SecureChat as it is not reliable communication to operating surgeons for immediate patient care)     Cardiac clearance done Surgery postponed to very late start - recshceduled for 2 days later Pt ready for surgery  [name], MD, FACS, MASCRS  Gastrointestinal and Minimally Invasive Surgery  [facility] [address] [place], [place] [address] [phone] Fax [phone] Main/Paging  CONTACT INFORMATION: Weekday (9AM-5PM) concerns: Call [facility] main office at [phone] Weeknight (5PM-9AM) or Weekend/Holiday concerns: Check [email or web address] for General Surgery [facility] coverage (Please, do not use SecureChat as it is not reliable communication to operating surgeons for immediate patient care)

## 2020-10-14 NOTE — Transfer of Care (Signed)
Immediate Anesthesia Transfer of Care Note  Patient: Joram Wigal  Procedure(s) Performed: LAPAROSCOPIC VENTRAL HERNIA REPAIR WITH TAP BLOCK BILATERAL (N/A Abdomen) LYSIS OF ADHESION (N/A Abdomen) INSERTION OF MESH (Abdomen)  Patient Location: PACU  Anesthesia Type:General  Level of Consciousness: awake, alert  and oriented  Airway & Oxygen Therapy: Patient Spontanous Breathing and Patient connected to face mask oxygen  Post-op Assessment: Report given to RN and Post -op Vital signs reviewed and stable  Post vital signs: Reviewed and stable  Last Vitals:  Vitals Value Taken Time  BP 130/65 10/14/20 1423  Temp 36.3 C 10/14/20 1423  Pulse 74 10/14/20 1429  Resp 19 10/14/20 1429  SpO2 93 % 10/14/20 1429  Vitals shown include unvalidated device data.  Last Pain:  Vitals:   10/14/20 1423  TempSrc:   PainSc: 2       Patients Stated Pain Goal: 2 (24/09/73 5329)  Complications: No complications documented.

## 2020-10-14 NOTE — Anesthesia Procedure Notes (Signed)
Procedure Name: Intubation Date/Time: 10/14/2020 11:37 AM Performed by: Babs Bertin, CRNA Pre-anesthesia Checklist: Patient identified, Emergency Drugs available, Suction available and Patient being monitored Patient Re-evaluated:Patient Re-evaluated prior to induction Oxygen Delivery Method: Circle System Utilized Preoxygenation: Pre-oxygenation with 100% oxygen Induction Type: IV induction Ventilation: Mask ventilation without difficulty Laryngoscope Size: Glidescope Grade View: Grade I Tube type: Oral Tube size: 7.5 mm Number of attempts: 1 Airway Equipment and Method: Stylet and Oral airway Placement Confirmation: ETT inserted through vocal cords under direct vision,  positive ETCO2 and breath sounds checked- equal and bilateral Secured at: 22 cm Tube secured with: Tape Dental Injury: Teeth and Oropharynx as per pre-operative assessment

## 2020-10-14 NOTE — Anesthesia Postprocedure Evaluation (Signed)
Anesthesia Post Note  Patient: Erik Salazar  Procedure(s) Performed: LAPAROSCOPIC VENTRAL HERNIA REPAIR WITH TAP BLOCK BILATERAL (N/A Abdomen) LYSIS OF ADHESION (N/A Abdomen) INSERTION OF MESH (Abdomen)     Patient location during evaluation: PACU Anesthesia Type: General Level of consciousness: awake and alert Pain management: pain level controlled Vital Signs Assessment: post-procedure vital signs reviewed and stable Respiratory status: spontaneous breathing, nonlabored ventilation, respiratory function stable and patient connected to nasal cannula oxygen Cardiovascular status: blood pressure returned to baseline and stable Postop Assessment: no apparent nausea or vomiting Anesthetic complications: no   No complications documented.  Last Vitals:  Vitals:   10/14/20 1452 10/14/20 1508  BP: 136/69 137/69  Pulse: 79 78  Resp: 15 14  Temp:  36.7 C  SpO2: 95% 97%    Last Pain:  Vitals:   10/14/20 1452  TempSrc:   PainSc: 2                  Tiajuana Amass

## 2020-10-14 NOTE — Anesthesia Preprocedure Evaluation (Signed)
Anesthesia Evaluation  Patient identified by MRN, date of birth, ID band Patient awake    Reviewed: Allergy & Precautions, NPO status , Patient's Chart, lab work & pertinent test results, reviewed documented beta blocker date and time   History of Anesthesia Complications (+) PONV  Airway Mallampati: II  TM Distance: >3 FB Neck ROM: Full    Dental  (+) Dental Advisory Given   Pulmonary former smoker,    breath sounds clear to auscultation       Cardiovascular hypertension, Pt. on medications and Pt. on home beta blockers + CAD, + CABG and + Peripheral Vascular Disease   Rhythm:Regular Rate:Normal  S/p Bentall and CABG. Normal EF. Bioprosthetic valve functioning normally.    Neuro/Psych Seizures -,   Neuromuscular disease CVA    GI/Hepatic Neg liver ROS, GERD  ,  Endo/Other  negative endocrine ROS  Renal/GU negative Renal ROS     Musculoskeletal  (+) Arthritis ,   Abdominal   Peds  Hematology negative hematology ROS (+)   Anesthesia Other Findings   Reproductive/Obstetrics                             Lab Results  Component Value Date   WBC 9.1 10/06/2020   HGB 15.5 10/06/2020   HCT 46.6 10/06/2020   MCV 83.8 10/06/2020   PLT 284 10/06/2020   Lab Results  Component Value Date   CREATININE 1.00 10/06/2020   BUN 16 10/06/2020   NA 132 (L) 10/06/2020   K 4.7 10/06/2020   CL 99 10/06/2020   CO2 23 10/06/2020    Anesthesia Physical Anesthesia Plan  ASA: III  Anesthesia Plan: General   Post-op Pain Management:    Induction: Intravenous  PONV Risk Score and Plan: 3 and Ondansetron and Dexamethasone  Airway Management Planned: Oral ETT  Additional Equipment: None  Intra-op Plan:   Post-operative Plan: Extubation in OR  Informed Consent: I have reviewed the patients History and Physical, chart, labs and discussed the procedure including the risks, benefits and  alternatives for the proposed anesthesia with the patient or authorized representative who has indicated his/her understanding and acceptance.     Dental advisory given  Plan Discussed with: CRNA  Anesthesia Plan Comments:         Anesthesia Quick Evaluation

## 2020-10-14 NOTE — Discharge Instructions (Signed)
HERNIA REPAIR: POST OP INSTRUCTIONS ° °###################################################################### ° °EAT °Gradually transition to a high fiber diet with a fiber supplement over the next few weeks after discharge.  Start with a pureed / full liquid diet (see below) ° °WALK °Walk an hour a day.  Control your pain to do that.   ° °CONTROL PAIN °Control pain so that you can walk, sleep, tolerate sneezing/coughing, and go up/down stairs. ° °HAVE A BOWEL MOVEMENT DAILY °Keep your bowels regular to avoid problems.  OK to try a laxative to override constipation.  OK to use an antidairrheal to slow down diarrhea.  Call if not better after 2 tries ° °CALL IF YOU HAVE PROBLEMS/CONCERNS °Call if you are still struggling despite following these instructions. °Call if you have concerns not answered by these instructions ° °###################################################################### ° ° ° °1. DIET: Follow a light bland diet & liquids the first 24 hours after arrival home, such as soup, liquids, starches, etc.  Be sure to drink plenty of fluids.  Quickly advance to a usual solid diet within a few days.  Avoid fast food or heavy meals as your are more likely to get nauseated or have irregular bowels.  A low-fat, high-fiber diet for the rest of your life is ideal.  ° °2. Take your usually prescribed home medications unless otherwise directed. ° °3. PAIN CONTROL: °a. Pain is best controlled by a usual combination of three different methods TOGETHER: °i. Ice/Heat °ii. Over the counter pain medication °iii. Prescription pain medication °b. Most patients will experience some swelling and bruising around the hernia(s) such as the bellybutton, groins, or old incisions.  Ice packs or heating pads (30-60 minutes up to 6 times a day) will help. Use ice for the first few days to help decrease swelling and bruising, then switch to heat to help relax tight/sore spots and speed recovery.  Some people prefer to use ice  alone, heat alone, alternating between ice & heat.  Experiment to what works for you.  Swelling and bruising can take several weeks to resolve.   °c. It is helpful to take an over-the-counter pain medication regularly for the first few weeks.  Choose one of the following that works best for you: °i. Naproxen (Aleve, etc)  Two 220mg tabs twice a day °ii. Ibuprofen (Advil, etc) Three 200mg tabs four times a day (every meal & bedtime) °iii. Acetaminophen (Tylenol, etc) 325-650mg four times a day (every meal & bedtime) °d. A  prescription for pain medication should be given to you upon discharge.  Take your pain medication as prescribed.  °i. If you are having problems/concerns with the prescription medicine (does not control pain, nausea, vomiting, rash, itching, etc), please call us [phone] to see if we need to switch you to a different pain medicine that will work better for you and/or control your side effect better. °ii. If you need a refill on your pain medication, please contact your pharmacy.  They will contact our office to request authorization. Prescriptions will not be filled after 5 pm or on week-ends. ° °4. Avoid getting constipated.  Between the surgery and the pain medications, it is common to experience some constipation.  Increasing fluid intake and taking a fiber supplement (such as Metamucil, Citrucel, FiberCon, MiraLax, etc) 1-2 times a day regularly will usually help prevent this problem from occurring.  A mild laxative (prune juice, Milk of Magnesia, MiraLax, etc) should be taken according to package directions if there are no bowel movements after 48   hours.   ° °5. Wash / shower every day.  You may shower over the dressings as they are waterproof.   ° °6. Remove your waterproof bandages, skin tapes, and other bandages 3 days after surgery. You may replace a dressing/Band-Aid to cover the incision for comfort if you wish. You may leave the incisions open to air.  You may replace a  dressing/Band-Aid to cover an incision for comfort if you wish.  Continue to shower over incision(s) after the dressing is off. ° °7. ACTIVITIES as tolerated:   °a. You may resume regular (light) daily activities beginning the next day--such as daily self-care, walking, climbing stairs--gradually increasing activities as tolerated.  Control your pain so that you can walk an hour a day.  If you can walk 30 minutes without difficulty, it is safe to try more intense activity such as jogging, treadmill, bicycling, low-impact aerobics, swimming, etc. °b. Save the most intensive and strenuous activity for last such as sit-ups, heavy lifting, contact sports, etc  Refrain from any heavy lifting or straining until you are off narcotics for pain control.   °c. DO NOT PUSH THROUGH PAIN.  Let pain be your guide: If it hurts to do something, don't do it.  Pain is your body warning you to avoid that activity for another week until the pain goes down. °d. You may drive when you are no longer taking prescription pain medication, you can comfortably wear a seatbelt, and you can safely maneuver your car and apply brakes. °e. You may have sexual intercourse when it is comfortable.  ° °8. FOLLOW UP in our office °a. Please call [facility] at [phone] to set up an appointment to see your surgeon in the office for a follow-up appointment approximately 2-3 weeks after your surgery. °b. Make sure that you call for this appointment the day you arrive home to insure a convenient appointment time. ° °9.  If you have disability of FMLA / Family leave forms, please bring the forms to the office for processing.  (do not give to your surgeon). ° °WHEN TO CALL US [phone]: °1. Poor pain control °2. Reactions / problems with new medications (rash/itching, nausea, etc)  °3. Fever over 101.5 F (38.5 C) °4. Inability to urinate °5. Nausea and/or vomiting °6. Worsening swelling or bruising °7. Continued bleeding from incision. °8. Increased pain,  redness, or drainage from the incision ° ° The clinic staff is available to answer your questions during regular business hours (8:30am-5pm).  Please don’t hesitate to call and ask to speak to one of our nurses for clinical concerns.  ° If you have a medical emergency, go to the nearest emergency room or call 911. ° A surgeon from [facility] is always on call at the hospitals in [place] ° °[facility] °[address], [address], [place], [place]  [address] ? ° [address] [address], [place], [place]   [address] °MAIN: [phone] ? TOLL FREE: [phone] ? FAX: [phone] °[email or web address] ° °

## 2020-10-14 NOTE — Op Note (Signed)
10/14/2020  PATIENT:  Erik Salazar  70 y.o. male  Patient Care Team: Pa, Marengo as PCP - General (Family Medicine) Jerline Pain, MD as PCP - Cardiology (Cardiology) Rolene Course, PA-C as Physician Assistant (Internal Medicine) Prescott Gum, Collier Salina, MD (Inactive) as Consulting Physician (Cardiothoracic Surgery) Otis Brace, MD as Consulting Physician (Gastroenterology) Michael Boston, MD as Consulting Physician (General Surgery)  PRE-OPERATIVE DIAGNOSIS:  ventral incisional incarcerated abdominal wall hernia  POST-OPERATIVE DIAGNOSIS:  Ventral incisional incarcerated abdominal wall hernia  PROCEDURE:   LAPAROSCOPIC REPAIR OF  INCISIONAL ABDOMINAL WALL HERNIA WITH MESH TAP BLOCK - BILATERAL  SURGEON:  [name], MD  ASSISTANT: Nurse   ANESTHESIA:     General  Nerve block provided with liposomal bupivacaine (Experel) mixed with 0.25% bupivacaine as a Bilateral TAP block x 90mL each side at the level of the transverse abdominis & preperitoneal spaces along the flank at the anterior axillary line, from subcostal ridge to iliac crest under laparoscopic guidance   EBL:  Total I/O In: 1100 [I.V.:1000; IV Piggyback:100] Out: 20 [Blood:20]  Per anesthesia record  Delay start of Pharmacological VTE agent (>24hrs) due to surgical blood loss or risk of bleeding:  no  DRAINS: none   SPECIMEN:  No Specimen  DISPOSITION OF SPECIMEN:  N/A  COUNTS:  YES  PLAN OF CARE: Discharge to home after PACU  PATIENT DISPOSITION:  PACU - hemodynamically stable.  INDICATION: Pleasant patient has developed a ventral wall abdominal hernia.  History of prior cardiac surgery with epigastric incisional hernia and subxiphoid region.  Recommendation was made for surgical repair  The anatomy & physiology of the abdominal wall was discussed. The pathophysiology of hernias was discussed. Natural history risks without surgery including progeressive enlargement, pain,  incarceration & strangulation was discussed. Contributors to complications such as smoking, obesity, diabetes, prior surgery, etc were discussed.  I feel the risks of no intervention will lead to serious problems that outweigh the operative risks; therefore, I recommended surgery to reduce and repair the hernia. I explained laparoscopic techniques with possible need for an open approach. I noted the probable use of mesh to patch and/or buttress the hernia repair.  Risks such as bleeding, infection, abscess, need for further treatment, heart attack, death, and other risks were discussed. I noted a good likelihood this will help address the problem. Goals of post-operative recovery were discussed as well. Possibility that this will not correct all symptoms was explained. I stressed the importance of low-impact activity, aggressive pain control, avoiding constipation, & not pushing through pain to minimize risk of post-operative chronic pain or injury. Possibility of reherniation especially with smoking, obesity, diabetes, immunosuppression, and other health conditions was discussed. We will work to minimize complications.   An educational handout further explaining the pathology & treatment options was given as well. Questions were answered. The patient expresses understanding & wishes to proceed with surgery.   OR FINDINGS:   Epigastric incisional hernia 8 x 5 cm region of Swiss cheese hernias.  Omentum and falciform ligament involved.  Type of repair: Laparoscopic underlay repair.     Placement of mesh: Upper third of mesh in the subxiphoid region.  Edges tucked into preperitoneal/retrorectus space.  Name of mesh: Bard Ventralight dual sided (polypropylene / Seprafilm)  Size of mesh: 25x20cm  Orientation: Vertical  Mesh overlap:  5-7cm   DESCRIPTION:   Informed consent was confirmed. The patient underwent general anaesthesia without difficulty. The patient was positioned appropriately. VTE  prevention in place. The  patient's abdomen was clipped, prepped, & draped in a sterile fashion. Surgical timeout confirmed our plan.  The patient was positioned in reverse Trendelenburg. Abdominal entry was gained using optical entry technique in the left upper abdomen. Entry was clean. I induced carbon dioxide insufflation. Camera inspection revealed no injury. Extra ports were carefully placed under direct laparoscopic visualization.   I could see adhesions on the parietal peritoneum under the abdominal wall.  I freed off the falciform ligament and omentum and central peritoneum to expose the retrorectus fascia   were rather dense adhesions of omentum and falciform ligament to the upper abdomen.  I freed off the middle third of the peritoneum off midline and carried it cephalad proximal to the xiphoid.  The posterior fascia was quite disruptive involving the hernia.  And released the falciform ligament and preperitoneal fat and adhesions exposed a Swiss cheese region of hernias associated with the subxiphoid region.  There was really no fascia between the costal edge/xiphoid edge and the hernia itself.  Not feel that there was a good opportunity for primary closure.  Therefore decided to bridge with a large sheet of mesh.  I made sure hemostasis was good.  I mapped out the region using a needle passer.   To ensure that I would have at least 5 cm radial coverage outside of the hernia defect, I chose a 25x20cm dual sided mesh.  I placed #1 Prolene stitches around its lower 1/2 edge about every 5 cm = 8 total.  I rolled the mesh & placed into the peritoneal cavity through a supraumbilical fascia incision.  I unrolled the mesh and positioned it appropriately.  I secured the mesh to cover up the hernia defect using a laparoscopic suture passer to pass the tails of the Prolene through the abdominal wall & tagged them with clamps for good transfascial suturing.  I started out in corners to make sure I had the mesh  centered under the hernia defect appropriately, and then proceeded to work in quadrants.  Made sure that it was well centered.  I also placed #1 Prolene sutures in the right and left paramedian subxiphoid regions where there was good fascia.  Also in the supraumbilical region.  This help tacked the mesh centrally and have good mesh approximation around the hernia defect.  We evacuated CO2 & desufflated the abdomen.  I tied the fascial stitches down. I closed the fascial defect that I placed the mesh through using #1 PDS interrupted transverse stitches primarily.  I reinsufflated the abdomen. The mesh provided at least circumferential coverage around the entire region of hernia defects.  I secured the mesh centrally with an additional trans fascial stitch in & out the mesh using #1 prolene under laparoscopic visualization.   I tacked the edges & central part of the mesh to the peritoneum/posterior rectus fascia with SecureStrap absorbable tacks.   I did reinspection. Hemostasis was good. Mesh laid well. I completed a broad field block of local anesthesia at fascial stitch sites & fascial closure areas.    Capnoperitoneum was evacuated. Ports were removed. The skin was closed with Monocryl at the port sites and Steri-Strips on the fascial stitch puncture sites.  Patient is being extubated to go to the recovery room.  I made an attempt to locate family to discuss patient's status and recommendations.  No one is available at this time.  I was able to get voicemail of his wife, "Bea".  Left instructions and updated her.    Remo Lipps  C. Johney Maine, M.D., F.A.C.S. Gastrointestinal and Minimally Invasive Surgery [facility] [address] [place], [place] [id number] [phone] Main / Paging  10/14/2020 2:20 PM

## 2020-10-17 ENCOUNTER — Encounter (HOSPITAL_COMMUNITY): Payer: Self-pay | Admitting: Surgery

## 2020-11-14 ENCOUNTER — Ambulatory Visit (INDEPENDENT_AMBULATORY_CARE_PROVIDER_SITE_OTHER): Payer: Medicare HMO | Admitting: Cardiology

## 2020-11-14 ENCOUNTER — Encounter: Payer: Self-pay | Admitting: Cardiology

## 2020-11-14 ENCOUNTER — Other Ambulatory Visit: Payer: Self-pay

## 2020-11-14 VITALS — BP 130/80 | HR 92 | Ht 70.0 in | Wt 200.0 lb

## 2020-11-14 DIAGNOSIS — I2510 Atherosclerotic heart disease of native coronary artery without angina pectoris: Secondary | ICD-10-CM | POA: Diagnosis not present

## 2020-11-14 DIAGNOSIS — I2583 Coronary atherosclerosis due to lipid rich plaque: Secondary | ICD-10-CM

## 2020-11-14 DIAGNOSIS — Z789 Other specified health status: Secondary | ICD-10-CM | POA: Diagnosis not present

## 2020-11-14 DIAGNOSIS — I712 Thoracic aortic aneurysm, without rupture, unspecified: Secondary | ICD-10-CM

## 2020-11-14 DIAGNOSIS — Z953 Presence of xenogenic heart valve: Secondary | ICD-10-CM

## 2020-11-14 DIAGNOSIS — Z9889 Other specified postprocedural states: Secondary | ICD-10-CM | POA: Diagnosis not present

## 2020-11-14 DIAGNOSIS — Z8679 Personal history of other diseases of the circulatory system: Secondary | ICD-10-CM

## 2020-11-14 NOTE — Patient Instructions (Signed)
Medication Instructions:  Your physician recommends that you continue on your current medications as directed. Please refer to the Current Medication list given to you today. *If you need a refill on your cardiac medications before your next appointment, please call your pharmacy*   Lab Work: None today If you have labs (blood work) drawn today and your tests are completely normal, you will receive your results only by: Marland Kitchen MyChart Message (if you have MyChart) OR . A paper copy in the mail If you have any lab test that is abnormal or we need to change your treatment, we will call you to review the results.   Testing/Procedures Your physician has requested that you have an echocardiogram. Echocardiography is a painless test that uses sound waves to create images of your heart. It provides your doctor with information about the size and shape of your heart and how well your heart's chambers and valves are working. This procedure takes approximately one hour. There are no restrictions for this procedure.   Follow-Up: At [facility], you and your health needs are our priority.  As part of our continuing mission to provide you with exceptional heart care, we have created designated Provider Care Teams.  These Care Teams include your primary Cardiologist (physician) and Advanced Practice Providers (APPs -  Physician Assistants and Nurse Practitioners) who all work together to provide you with the care you need, when you need it.  We recommend signing up for the patient portal called "MyChart".  Sign up information is provided on this After Visit Summary.  MyChart is used to connect with patients for Virtual Visits (Telemedicine).  Patients are able to view lab/test results, encounter notes, upcoming appointments, etc.  Non-urgent messages can be sent to your provider as well.   To learn more about what you can do with MyChart, go to [email or web address].    Your next appointment:   12  month(s)  The format for your next appointment:   In Person  Provider:   You may see Candee Furbish, MD or one of the following Advanced Practice Providers on your designated Care Team:    Kathyrn Drown, NP

## 2020-11-14 NOTE — Progress Notes (Signed)
Cardiology Office Note:    Date:  11/14/2020   ID:  Erik Salazar, DOB 1951/03/08, MRN [id number]  PCP:  Pa, [facility]  Cardiologist:  Candee Furbish, MD  Advanced Practice Provider:  No care team member to display Electrophysiologist:  None       Referring MD: [name] An*     History of Present Illness:    Erik Salazar is a 70 y.o. male with a hx of resection of a ascending aneurysm with Bentall procedure and CABG in [place] in 2017.After moving here, a follow up CTA bu Dr. VanTrigtdemonstrated a probable false aneurysm of the distal suture line of the aortic graft. It measures over 6 cm in length. Patient asked for a second opinion at [facility]. The patient subsequently underwent redo sternotomy in the summer 2018 at [facility]. A 9 cm abscess collection along the graft was found. There is no false aneurysm. Gram stain of this area was negative. The abscess was debrided and the space was filled with omental flap performed at the same time by plastic surgery. Patient also had a repeat coronary graft using left IMA to LAD for a stenotic veingraft from the original operation.  He had apostoperative CVA with expressive aphasia and some swallowing difficulty. He is being followed here by Dr. [name]. He has undergone physical therapy and speech therapy. He has improved and now has a fairly good functional status.He has declined follow-up at [facility] and wishes to be followed [facility] for his aortic cardiac surgery.  Follow up CT shows resolution of abscess (had dye extravasation). ECHO with normal AV function. Plan is to have one year CTA by Dr. [name].   Dr. [name] has given him a disability placard. He indicated that he has trouble locating his car secondary to memory problems. He said he had no problem driving. He was instructed to taper off Keppra.  10/28/17-sinus rhythm 80 with occasional bigeminy noted on his  EKG. he is doing well exercising 6 days a week, no syncope, no orthopnea, no chest pain. He did still state that he has some numbness in his first 3 finger distribution. Wearing a brace.  10/28/2018- he is here for follow-up of ascending aneurysm, CAD CABG, stroke.  On 05/05/2018 he was in the emergency department with shortness of breath and chest pain.  Arrived as a daily issue in the substernal region moderate.  CT scan was performed as well as troponin BMP and EKG.  These results were reviewed and he had follow-up with Dr. [name].Marland Kitchen  He reviewed CT images with he and family.  Right hand weakness poor grip.  No symptoms of angina or CHF.  Small ventral hernia noted below xiphoid.  Not impressive on CT scan.  Aortic repair looks fine.  Dr. Amedeo Plenty evaluated hand.  Had carpal tunnel procedure. Gym 5 days weights.  Doing very well without any difficulty.  Using light weights.  Maintaining muscle mass.   11/06/2019-here for follow-up of ascending aortic aneurysm repair.  Back on 07/29/2019 he did go to the emergency room after feeling some left upper back pain.  CT scan was negative for any changes to aneurysm.  Descending aorta infrarenal dissection noted.  Medically managed.  Kidney cyst noted.  No changes.  Overall doing well with his blood pressure control.  He feels well.  His wife unfortunately slipped on water in the garage hit her head and had a brain bleed.  It has been several  weeks and she still cannot taste or smell.  This was not related to Covid.  11/14/20: here for the follow up of TAA repair (with redo sternotomy). Denies any fevers chills nausea vomiting.  Overall been feeling well.  Did have ventral hernia repair large mesh placed with Dr. Johney Maine.  Also had cervical spine discectomies.  Arms are functioning, hands still numb.  Exercising, walking 5K a day.  Lost 15 pounds.  Past Medical History:  Diagnosis Date  . Carpal tunnel syndrome on right 07/08/2017  . Cervical radiculopathy at C6  11/14/2017   Right  . Complication of anesthesia   . Coronary artery disease   . Enlarged prostate   . H/O: knee surgery    15   . History of open heart surgery    ASCENDING AORTIC ANEURYSM  . Ischemic stroke of frontal lobe ([place]) 03/14/2017   Bilateral; post-redo CT surgery  . PONV (postoperative nausea and vomiting)    only after CABG surgeries  . Seizure disorder ([place]) 03/14/2017  . Seizures ([place])   . Status post knee surgery    DVT POST KNEE SURGERY    Past Surgical History:  Procedure Laterality Date  . ANTERIOR CERVICAL DECOMP/DISCECTOMY FUSION N/A 05/04/2020   Procedure: Anterior Cervical Decompression/Discectomy Fuion Cervical three-four, Cervical four-five, Cervical five-six;  Surgeon: Kary Kos, MD;  Location: [place];  Service: Neurosurgery;  Laterality: N/A;  . BALLOON DILATION N/A 06/23/2019   Procedure: BALLOON DILATION;  Surgeon: Otis Brace, MD;  [facility];  Service: Gastroenterology;  Laterality: N/A;  . BENTALL PROCEDURE  01/04/2016   Bentall with 23 mm pericardial AVR; SVG-LAD, SVG-CX ([place])  . BIOPSY  06/23/2019   Procedure: BIOPSY;  Surgeon: Otis Brace, MD;  [facility];  Service: Gastroenterology;;  . CARDIAC SURGERY     ANUERSYM MAY 2017   Bentall procedure. 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 CARPAL TUNNEL RELEASE  08/2018   right hand   . CORONARY ARTERY BYPASS GRAFT  01/04/2016   VG to LAD & VG to LCX  . CORONARY ARTERY BYPASS GRAFT  03/13/2017   LIMA to LAD with steril abcess removal from dacron graft  . ESOPHAGOGASTRODUODENOSCOPY (EGD) WITH PROPOFOL N/A 06/23/2019   Procedure: ESOPHAGOGASTRODUODENOSCOPY (EGD) WITH PROPOFOL;  Surgeon: Otis Brace, MD;  [facility];  Service: Gastroenterology;  Laterality: N/A;  . FALSE ANEURYSM REPAIR  03/13/2017   redo sternotomy, sterile abscess removal from  Dacron graft, omental flap around aorta, CABG: LIMA-LAD ([name], Dr. Mart Piggs)  . GREATER OMENTAL FLAP CLOSURE  2018   Of pericardium 2018  . INSERTION OF MESH  10/14/2020   Procedure: INSERTION OF MESH;  Surgeon: Michael Boston, MD;  Location: [place];  Service: General;;  . knee surgeries     13 surgeries on knee done before knee replacement   . KNEE SURGERY  1983  . LYSIS OF ADHESION N/A 10/14/2020   Procedure: LYSIS OF ADHESION;  Surgeon: Michael Boston, MD;  Location: [place];  Service: General;  Laterality: N/A;  . open heart surgery     03-13-2017  . REPLACEMENT TOTAL KNEE  2015  . ROTATOR CUFF REPAIR    . TONSILLECTOMY     removed as a child.  . VENTRAL HERNIA REPAIR N/A 10/14/2020   Procedure: LAPAROSCOPIC VENTRAL HERNIA REPAIR WITH TAP BLOCK BILATERAL;  Surgeon: Michael Boston, MD;  Location: [place];  Service: General;  Laterality: N/A;    Current Medications: Current Meds  Medication Sig  . acetaminophen (TYLENOL) 325 MG tablet Take 650 mg by mouth every 6 (six) hours as needed for moderate pain or headache.  Marland Kitchen ascorbic acid (VITAMIN C) 500 MG tablet Take 500 mg by mouth daily.  Marland Kitchen aspirin EC 81 MG tablet Take 81 mg by mouth daily. Swallow whole.  . b complex vitamins capsule Take 1 capsule by mouth daily.  . Cholecalciferol (VITAMIN D3) 250 MCG (10000 UT) TABS Take 10,000 Units by mouth daily.  Marland Kitchen levETIRAcetam (KEPPRA) 750 MG tablet Take 1 tablet (750 mg total) by mouth 2 (two) times daily.  . methocarbamol (ROBAXIN) 750 MG tablet Take 1 tablet (750 mg total) by mouth 4 (four) times daily as needed (use for muscle cramps/pain).  . metoprolol succinate (TOPROL-XL) 25 MG 24 hr tablet Take 0.5 tablets (12.5 mg total) by mouth daily. With or immediately following a meal.  . pravastatin (PRAVACHOL) 40 MG tablet TAKE ONE TABLET BY MOUTH EVERY EVENING  . tamsulosin (FLOMAX) 0.4 MG CAPS capsule Take 0.4 mg by mouth every evening.   . traMADol (ULTRAM) 50 MG tablet Take 1-2 tablets (50-100  mg total) by mouth every 6 (six) hours as needed for moderate pain or severe pain.     Allergies:   Oxycodone hcl, Zetia [ezetimibe], and Elemental sulfur   Social History   Socioeconomic History  . Marital status: Married    Spouse name: Not on file  . Number of children: Not on file  . Years of education: Not on file  . Highest education level: Not on file  Occupational History  . Occupation: RETIRED  Tobacco Use  . Smoking status: Former Smoker    Types: Cigars  . Smokeless tobacco: Never Used  Vaping Use  . Vaping Use: Never used  Substance and Sexual Activity  . Alcohol use: Yes    Alcohol/week: 1.0 standard drink    Types: 1 Cans of beer per week    Comment: occassionally  . Drug use: No  . Sexual activity: Not on file  Other Topics Concern  . Not on file  Social History Narrative   Lives at home with wife, is retired.  Education: 2 yrs college.   2 Children.   Social Determinants of Health   Financial Resource Strain: Not on file  Food Insecurity: Not on file  Transportation Needs: Not on file  Physical Activity: Not on file  Stress: Not on file  Social Connections: Not on file     Family History: The patient's family history includes Aneurysm in his mother.  ROS:   Please see the history of present illness.     All other systems reviewed and are negative.  EKGs/Labs/Other Studies Reviewed:    The following studies were reviewed today:   EKG: 10/06/2020-sinus rhythm right bundle branch block  Recent Labs: 10/06/2020: BUN 16; Creatinine, Ser 1.00; Hemoglobin 15.5; Platelets 284; Potassium 4.7; Sodium 132  Recent Lipid Panel    Component Value Date/Time   CHOL 200 (H) 11/06/2019 0954   TRIG 177 (H) 11/06/2019 0954   HDL 47 11/06/2019 0954   CHOLHDL 4.3 11/06/2019 0954   LDLCALC 122 (H) 11/06/2019 0954     Risk Assessment/Calculations:      Physical Exam:    VS:  BP 130/80 (BP Location: Left Arm, Patient Position: Sitting, Cuff Size:  Normal)   Pulse 92   Ht 5\' 10"  (1.778 m)   Wt  200 lb (90.7 kg)   SpO2 97%   BMI 28.70 kg/m     Wt Readings from Last 3 Encounters:  11/14/20 200 lb (90.7 kg)  10/14/20 199 lb 15.3 oz (90.7 kg)  10/12/20 200 lb (90.7 kg)     GEN:  Well nourished, well developed in no acute distress HEENT: Normal NECK: No JVD; No carotid bruits LYMPHATICS: No lymphadenopathy CARDIAC: RRR, no murmurs, rubs, gallops RESPIRATORY:  Clear to auscultation without rales, wheezing or rhonchi  ABDOMEN: Soft, non-tender, non-distended MUSCULOSKELETAL:  No edema; No deformity  SKIN: Warm and dry NEUROLOGIC:  Alert and oriented x 3 PSYCHIATRIC:  Normal affect   ASSESSMENT:    1. Coronary artery disease due to lipid rich plaque   2. Statin intolerance   3. H/O aortic valve repair   4. Thoracic aortic aneurysm without rupture ([place])   5. History of aortic valve replacement with bioprosthetic valve    PLAN:    In order of problems listed above:   Ascending aortic aneurysm repair Bentall procedure bioprosthetic aortic valve replacement redo sternotomy secondary to false aneurysm at distal suture line stable - Dr. [name] had been monitoring.  Recent CT unremarkable.  03/09/20 - IMPRESSION: 1. Stable postsurgical changes in the chest. The aortic graft is patent without complicating features. Negative for aortic dissection. 2. No acute abnormality in the chest. 3. Hyperdense cyst in left kidney upper pole. This is similar to the prior abdominal CT and probably represents a hemorrhagic or proteinaceous cyst.  Recent EF normal. ER 07/29/2019.   Infrarenal distal aortic dissection noted.  Medically managed.  -Checking echocardiogram.  Aortic valve repair.  Coronary artery disease status post CABG -  03/13/2017, sterile abscess removed.  SVG from aorta to LAD and SVG to circumflex previously on 01/04/2016.  SVG to OM 2 patent.  No anginal symptoms.  Continue with aggressive secondary risk factor prevention.   Statin, aspirin, beta-blocker.  Stable with no changes.  Ventral hernia repair -- Dr. Johney Maine 10/14/20.  C3-6 discectomies -- Dr. Saintclair Halsted  Hyperlipidemia -Pravastatin, no changes.  He has had troubles in the past with Crestor atorvastatin other statins.  Leg discomfort.  Doing well with pravastatin.  Last LDL 122 not at goal of less than 70.  History of stroke -Dr. [name] has been monitoring.  Mild cognitive issues.    No changes made.  1 Year follow-up    Medication Adjustments/Labs and Tests Ordered: Current medicines are reviewed at length with the patient today.  Concerns regarding medicines are outlined above.  Orders Placed This Encounter  Procedures  . ECHOCARDIOGRAM COMPLETE   No orders of the defined types were placed in this encounter.   Patient Instructions  Medication Instructions:  Your physician recommends that you continue on your current medications as directed. Please refer to the Current Medication list given to you today. *If you need a refill on your cardiac medications before your next appointment, please call your pharmacy*   Lab Work: None today If you have labs (blood work) drawn today and your tests are completely normal, you will receive your results only by: Marland Kitchen MyChart Message (if you have MyChart) OR . A paper copy in the mail If you have any lab test that is abnormal or we need to change your treatment, we will call you to review the results.   Testing/Procedures Your physician has requested that you have an echocardiogram. Echocardiography is a painless test that uses sound waves to create images of your heart. It  provides your doctor with information about the size and shape of your heart and how well your heart's chambers and valves are working. This procedure takes approximately one hour. There are no restrictions for this procedure.   Follow-Up: At [facility], you and your health needs are our priority.  As part of our continuing  mission to provide you with exceptional heart care, we have created designated Provider Care Teams.  These Care Teams include your primary Cardiologist (physician) and Advanced Practice Providers (APPs -  Physician Assistants and Nurse Practitioners) who all work together to provide you with the care you need, when you need it.  We recommend signing up for the patient portal called "MyChart".  Sign up information is provided on this After Visit Summary.  MyChart is used to connect with patients for Virtual Visits (Telemedicine).  Patients are able to view lab/test results, encounter notes, upcoming appointments, etc.  Non-urgent messages can be sent to your provider as well.   To learn more about what you can do with MyChart, go to [email or web address].    Your next appointment:   12 month(s)  The format for your next appointment:   In Person  Provider:   You may see Candee Furbish, MD or one of the following Advanced Practice Providers on your designated Care Team:    Kathyrn Drown, NP         Signed, Candee Furbish, MD  11/14/2020 8:38 AM    [place]

## 2020-11-15 ENCOUNTER — Other Ambulatory Visit: Payer: Self-pay

## 2020-11-15 MED ORDER — METOPROLOL SUCCINATE ER 25 MG PO TB24: 12.5000 mg | ORAL_TABLET | Freq: Every day | ORAL | 3 refills | Status: DC

## 2020-12-08 ENCOUNTER — Ambulatory Visit (HOSPITAL_COMMUNITY): Payer: Medicare HMO | Attending: Cardiology

## 2020-12-08 ENCOUNTER — Other Ambulatory Visit: Payer: Self-pay

## 2020-12-08 DIAGNOSIS — I712 Thoracic aortic aneurysm, without rupture, unspecified: Secondary | ICD-10-CM

## 2020-12-08 DIAGNOSIS — Z8679 Personal history of other diseases of the circulatory system: Secondary | ICD-10-CM | POA: Diagnosis present

## 2020-12-08 DIAGNOSIS — Z9889 Other specified postprocedural states: Secondary | ICD-10-CM

## 2020-12-08 DIAGNOSIS — Z953 Presence of xenogenic heart valve: Secondary | ICD-10-CM

## 2020-12-08 LAB — ECHOCARDIOGRAM COMPLETE
AV Mean grad: 6 mmHg
AV Peak grad: 10.1 mmHg
Ao pk vel: 1.59 m/s
Area-P 1/2: 4.49 cm2
S' Lateral: 3 cm

## 2021-01-18 ENCOUNTER — Other Ambulatory Visit: Payer: Self-pay

## 2021-01-18 MED ORDER — PRAVASTATIN SODIUM 40 MG PO TABS: 40.0000 mg | ORAL_TABLET | Freq: Every evening | ORAL | 3 refills | Status: DC

## 2021-01-31 ENCOUNTER — Other Ambulatory Visit: Payer: Self-pay | Admitting: Neurology

## 2021-02-28 ENCOUNTER — Ambulatory Visit (INDEPENDENT_AMBULATORY_CARE_PROVIDER_SITE_OTHER): Payer: Medicare HMO | Admitting: Orthopaedic Surgery

## 2021-02-28 ENCOUNTER — Ambulatory Visit (INDEPENDENT_AMBULATORY_CARE_PROVIDER_SITE_OTHER): Payer: Medicare HMO

## 2021-02-28 DIAGNOSIS — M25561 Pain in right knee: Secondary | ICD-10-CM | POA: Diagnosis not present

## 2021-02-28 DIAGNOSIS — G8929 Other chronic pain: Secondary | ICD-10-CM

## 2021-02-28 DIAGNOSIS — M545 Low back pain, unspecified: Secondary | ICD-10-CM

## 2021-02-28 NOTE — Progress Notes (Signed)
Office Visit Note   Patient: Erik Salazar           Date of Birth: Jul 24, 1951           MRN: [id number] Visit Date: 02/28/2021              Requested by: Erik Salazar,  [place] [id number] PCP: Pa, Erik Salazar: Visit Diagnoses:  1. Chronic pain of right knee   2. Chronic midline low back pain without sciatica     Plan: Impression is medial meniscus tear of the right knee.  At this point he has failed conservative treatments with shots of medications and activity modifications therefore we will need to obtain MRI.  Follow-up after the MRI.  He will see Dr. Saintclair Salazar who is his spine surgeon about chronic low back pain.  Follow-Up Instructions: Return if symptoms worsen or fail to improve.   Orders:  Orders Placed This Encounter  Procedures   XR Knee 1-2 Views Right   MR Knee Right w/o contrast   No orders of the defined types were placed in this encounter.     Procedures: No procedures performed   Clinical Data: No additional findings.   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  He has had several surgeries on the right knee and he has had a left total knee replacement as well.  He has constant weakness with giving way and significant locking after being in a seated position.  He has tried cortisone shots as well as Visco shots back in [place] and he has not felt any relief from these treatments.  He denies any swelling.  He does have increased pain upon waking up in the morning.   Review of Systems  Constitutional: Negative.   All other systems reviewed and are negative.   Objective: Vital Signs: There were no vitals taken for this visit.  Physical Exam Vitals and nursing note reviewed.   Constitutional:      Appearance: He is well-developed.  HENT:     Head: Normocephalic and atraumatic.  Eyes:     Pupils: Pupils are equal, round, and reactive to light.  Pulmonary:     Effort: Pulmonary effort is normal.  Abdominal:     Palpations: Abdomen is soft.  Musculoskeletal:        General: Normal range of motion.     Cervical back: Neck supple.  Skin:    General: Skin is warm.  Neurological:     Mental Status: He is alert and oriented to person, place, and time.  Psychiatric:        Behavior: Behavior normal.        Thought Content: Thought content normal.        Judgment: Judgment normal.    Ortho Exam Right knee shows medial joint line tenderness and a positive McMurray's sign.  No joint effusion.  Collaterals and cruciates are grossly intact.  Range of motion is mildly decreased secondary to pain. Specialty Comments:  No specialty comments available.  Imaging: XR Knee 1-2 Views Right  Result Date: 02/28/2021 No acute or structural abnormalities.    PMFS History: Patient Active Problem List   Diagnosis Date Noted   Benign prostatic hyperplasia without lower  urinary tract symptoms 10/14/2020   Chronic pain 10/14/2020   ED (erectile dysfunction) of organic origin 10/14/2020   Esophageal dysphagia 10/14/2020   Gastroesophageal reflux disease 10/14/2020   History of aortic valve replacement with bioprosthetic valve 10/14/2020   Hx of aortic aneurysm repair 10/14/2020   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Erik [place]azar) 10/14/2020   Thoracic aortic aneurysm without rupture (Erik Salazar) 10/14/2020   Recurrent incisional hernia 10/14/2020   Spinal stenosis in cervical region 05/04/2020   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   Cervical radiculopathy at C6 11/14/2017   Carpal tunnel syndrome on right 07/08/2017   Tremor, essential 07/08/2017   Ischemic stroke of frontal lobe (Erik Salazar) 04/05/2017   Pain in right hand 04/05/2017   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 03/27/2017   Seizure  (Erik [place]) 03/14/2017   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/13/2017   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Erik Salazar) 03/12/2017   Essential hypertension 02/26/2017   Mixed hyperlipidemia 02/26/2017   Past Medical History:  Diagnosis Date   Carpal tunnel syndrome on right 07/08/2017   Cervical radiculopathy at C6 5/88/5027   Right   Complication of anesthesia    Coronary artery disease    Enlarged prostate    H/O: knee surgery    15    History of open heart surgery    ASCENDING AORTIC ANEURYSM   Ischemic stroke of frontal lobe (Erik Salazar) 03/14/2017   Bilateral; post-redo CT surgery   PONV (postoperative nausea and vomiting)    only after CABG surgeries   Seizure disorder (Erik Salazar) 03/14/2017   Seizures (Erik Salazar)    Status post knee surgery    DVT POST KNEE SURGERY    Family History  Problem Relation Age of Onset   Aneurysm Mother        brain aneurysm for mother.     Past Surgical History:  Procedure Laterality Date   ANTERIOR CERVICAL DECOMP/DISCECTOMY FUSION N/A 05/04/2020   Procedure: Anterior Cervical Decompression/Discectomy Fuion Cervical three-four, Cervical four-five, Cervical five-six;  Surgeon: Erik Kos, MD;  Location: [place];  Service: Neurosurgery;  Laterality: N/A;   BALLOON DILATION N/A 06/23/2019   Procedure: BALLOON DILATION;  Surgeon: Erik Brace, MD;  [facility];  Service: Gastroenterology;  Laterality: N/A;   BENTALL PROCEDURE  01/04/2016   Bentall with 23 mm pericardial AVR; SVG-LAD, SVG-CX ([place]rik [place])   BIOPSY  06/23/2019   Procedure: BIOPSY;  Surgeon: Erik Brace, MD;  [facility];  Service: Gastroenterology;;   CARDIAC SURGERY     ANUERSYM MAY 2017   Bentall procedure. Bioprosthetic aortic valve #23 mm bovine model #2700 TF ask, and 28 mm Gelweave woven vascular sinus of Valsalva graft   CARPAL TUNNEL RELEASE  08/2018   right hand    CORONARY ARTERY BYPASS GRAFT  01/04/2016   VG to LAD & VG to LCX   CORONARY ARTERY BYPASS  GRAFT  03/13/2017   LIMA to LAD with steril abcess removal from dacron graft   ESOPHAGOGASTRODUODENOSCOPY (EGD) WITH PROPOFOL N/A 06/23/2019   Procedure: ESOPHAGOGASTRODUODENOSCOPY (EGD) WITH PROPOFOL;  Surgeon: Erik Brace, MD;  [facility];  Service: Gastroenterology;  Laterality: N/A;   FALSE ANEURYSM REPAIR  03/13/2017   redo sternotomy, sterile abscess removal from Dacron graft, omental flap around aorta, CABG: LIMA-LAD ([name], Dr. Mart Piggs)   [place]  2018   Of pericardium 2018   INSERTION OF MESH  10/14/2020   Procedure: INSERTION OF MESH;  Surgeon: Michael Boston, MD;  Location: [place];  Service: General;;  knee surgeries     13 surgeries on knee done before knee replacement    [place] N/A 10/14/2020   Procedure: LYSIS OF ADHESION;  Surgeon: Michael Boston, MD;  Location: [place];  Service: General;  Laterality: N/A;   open heart surgery     03-13-2017   REPLACEMENT TOTAL KNEE  2015   ROTATOR CUFF REPAIR     TONSILLECTOMY     removed as a child.   VENTRAL HERNIA REPAIR N/A 10/14/2020   Procedure: LAPAROSCOPIC VENTRAL HERNIA REPAIR WITH TAP BLOCK BILATERAL;  Surgeon: Michael Boston, MD;  Location: [place];  Service: General;  Laterality: N/A;   Social History   Occupational History   Occupation: RETIRED  Tobacco Use   Smoking status: Former    Pack years: 0.00    Types: Cigars   Smokeless tobacco: Never  Vaping Use   Vaping Use: Never used  Substance and Sexual Activity   Alcohol use: Yes    Alcohol/week: 1.0 standard drink    Types: 1 Cans of beer per week    Comment: occassionally   Drug use: No   Sexual activity: Not on file

## 2021-03-04 ENCOUNTER — Ambulatory Visit (HOSPITAL_BASED_OUTPATIENT_CLINIC_OR_DEPARTMENT_OTHER): Payer: Medicare HMO

## 2021-03-06 ENCOUNTER — Telehealth: Payer: Self-pay | Admitting: Neurology

## 2021-03-06 ENCOUNTER — Encounter: Payer: Self-pay | Admitting: Neurology

## 2021-03-06 NOTE — Progress Notes (Signed)
PATIENT: Erik Salazar DOB: 07-12-1951  REASON FOR VISIT: Follow up HISTORY FROM: Patient Primary Neurologist: Dr. [name]   Today 03/07/21 Erik Salazar is a 70 year old male with history of bifrontal strokes, seizures, and essential tremor.  Has had cervical spine surgery with Dr. Saintclair Halsted back in September 2021.  On Keppra, helps tremor also. Had incisional hernia repair back in March. Claims needs back surgery, right knee surgery. Has been going through pain with these issues. Here today to discuss acute issue, on Saturday, watching TV, having painful day with chronic issues, then felt "not right", feeling off balance, then started shaking all over, arms and legs bilaterally, he was cold, put blankets on, his wife came in, tried to calm him down. For 1 hour. Stopped on its own. Rest of day felt weak, tired, left eye isn't clear vision, both hands are more numb than baseline. He claims this is how he felt after he woke up from heart surgery and had a stroke. Tremor in both hands, left more than right hand at baseline. Denies anxiety. Exercises daily. Hands feel cold. Claims balance is off more than normal. Hasn't missed any Keppra. Here today alone. On aspirin 81 mg daily. Denies fever, no cough, has been on antibiotics Augmentin for 8 days, is getting better, had been running at the gym up until Saturday.   HISTORY  07/12/2020 Dr. [name]: Mr. Clingan is a 70 year old right-handed white male with a history of bifrontal strokes and seizures.  The patient also has an essential tremor.  He has had troubles with right hand numbness associated with a C6 radiculopathy.  He underwent surgery through Dr. Saintclair Halsted on 04 May 2020.  He is recovering from the surgery that was a 3 level procedure at the C3-4 and C4-5 and C5-6 levels.  The patient still has some right hand numbness but has no significant discomfort.  The patient remains on Keppra taking 750 mg twice daily, his last seizure event was in July 2020  when he tried to come off of the [place].  The patient indicates that the [place] helps his tremor, if he takes his medication late, he notes that the tremor will worsen.  The patient is being evaluated for possible hiatal hernia surgery in December 2021.  Overall he is doing well at this time.  REVIEW OF SYSTEMS: Out of a complete 14 system review of symptoms, the patient complains only of the following symptoms, and all other reviewed systems are negative.  See HPI  ALLERGIES: Allergies  Allergen Reactions   Oxycodone Hcl Other (See Comments)    DELUSIONAL   Zetia [Ezetimibe] Other (See Comments)    Leg cramps   Elemental Sulfur Rash    HOME MEDICATIONS: Outpatient Medications Prior to Visit  Medication Sig Dispense Refill   acetaminophen (TYLENOL) 325 MG tablet Take 650 mg by mouth every 6 (six) hours as needed for moderate pain or headache.     ascorbic acid (VITAMIN C) 500 MG tablet Take 500 mg by mouth daily.     aspirin EC 81 MG tablet Take 81 mg by mouth daily. Swallow whole.     b complex vitamins capsule Take 1 capsule by mouth daily.     Cholecalciferol (VITAMIN D3) 250 MCG (10000 UT) TABS Take 10,000 Units by mouth daily.     levETIRAcetam (KEPPRA) 750 MG tablet TAKE ONE TABLET BY MOUTH TWICE A DAY 180 tablet 1   metoprolol succinate (TOPROL-XL) 25 MG 24 hr tablet Take 0.5 tablets (12.5  mg total) by mouth daily. With or immediately following a meal. 45 tablet 3   pravastatin (PRAVACHOL) 40 MG tablet Take 1 tablet (40 mg total) by mouth every evening. 90 tablet 3   tamsulosin (FLOMAX) 0.4 MG CAPS capsule Take 0.4 mg by mouth every evening.      methocarbamol (ROBAXIN) 750 MG tablet Take 1 tablet (750 mg total) by mouth 4 (four) times daily as needed (use for muscle cramps/pain). 30 tablet 2   traMADol (ULTRAM) 50 MG tablet Take 1-2 tablets (50-100 mg total) by mouth every 6 (six) hours as needed for moderate pain or severe pain. 30 tablet 0   No facility-administered  medications prior to visit.    PAST MEDICAL HISTORY: Past Medical History:  Diagnosis Date   Carpal tunnel syndrome on right 07/08/2017   Cervical radiculopathy at C6 7/67/3419   Right   Complication of anesthesia    Coronary artery disease    Enlarged prostate    H/O: knee surgery    15    History of open heart surgery    ASCENDING AORTIC ANEURYSM   Ischemic stroke of frontal lobe ([place]) 03/14/2017   Bilateral; post-redo CT surgery   PONV (postoperative nausea and vomiting)    only after CABG surgeries   Seizure disorder ([place]) 03/14/2017   Seizures ([place])    Status post knee surgery    DVT POST KNEE SURGERY    PAST SURGICAL HISTORY: Past Surgical History:  Procedure Laterality Date   ANTERIOR CERVICAL DECOMP/DISCECTOMY FUSION N/A 05/04/2020   Procedure: Anterior Cervical Decompression/Discectomy Fuion Cervical three-four, Cervical four-five, Cervical five-six;  Surgeon: Kary Kos, MD;  Location: [facility];  Service: Neurosurgery;  Laterality: N/A;   BALLOON DILATION N/A 06/23/2019   Procedure: BALLOON DILATION;  Surgeon: Otis Brace, MD;  [facility];  Service: Gastroenterology;  Laterality: N/A;   BENTALL PROCEDURE  01/04/2016   Bentall with 23 mm pericardial AVR; SVG-LAD, SVG-CX ([place])   BIOPSY  06/23/2019   Procedure: BIOPSY;  Surgeon: Otis Brace, MD;  [facility];  Service: Gastroenterology;;   CARDIAC SURGERY     ANUERSYM MAY 2017   Bentall procedure. Bioprosthetic aortic valve #23 mm bovine model #2700 TF ask, and 28 mm Gelweave woven vascular sinus of Valsalva graft   CARPAL TUNNEL RELEASE  08/2018   right hand    CORONARY ARTERY BYPASS GRAFT  01/04/2016   VG to LAD & VG to LCX   CORONARY ARTERY BYPASS GRAFT  03/13/2017   LIMA to LAD with steril abcess removal from dacron graft   ESOPHAGOGASTRODUODENOSCOPY (EGD) WITH PROPOFOL N/A 06/23/2019   Procedure: ESOPHAGOGASTRODUODENOSCOPY (EGD) WITH PROPOFOL;  Surgeon:  Otis Brace, MD;  [facility];  Service: Gastroenterology;  Laterality: N/A;   FALSE ANEURYSM REPAIR  03/13/2017   redo sternotomy, sterile abscess removal from Dacron graft, omental flap around aorta, CABG: LIMA-LAD ([name], Dr. Mart Piggs)   [place]  2018   Of pericardium 2018   INSERTION OF MESH  10/14/2020   Procedure: INSERTION OF MESH;  Surgeon: Michael Boston, MD;  Location: [facility];  Service: General;;   knee surgeries     13 surgeries on knee done before knee replacement    [place] N/A 10/14/2020   Procedure: LYSIS OF ADHESION;  Surgeon: Michael Boston, MD;  Location: [facility];  Service: General;  Laterality: N/A;   open heart surgery     03-13-2017  REPLACEMENT TOTAL KNEE  2015   ROTATOR CUFF REPAIR     TONSILLECTOMY     removed as a child.   VENTRAL HERNIA REPAIR N/A 10/14/2020   Procedure: LAPAROSCOPIC VENTRAL HERNIA REPAIR WITH TAP BLOCK BILATERAL;  Surgeon: Michael Boston, MD;  Location: [facility];  Service: General;  Laterality: N/A;    FAMILY HISTORY: Family History  Problem Relation Age of Onset   Aneurysm Mother        brain aneurysm for mother.     SOCIAL HISTORY: Social History   Socioeconomic History   Marital status: Married    Spouse name: Not on file   Number of children: Not on file   Years of education: Not on file   Highest education level: Not on file  Occupational History   Occupation: RETIRED  Tobacco Use   Smoking status: Former    Types: Cigars   Smokeless tobacco: Never  Vaping Use   Vaping Use: Never used  Substance and Sexual Activity   Alcohol use: Yes    Alcohol/week: 1.0 standard drink    Types: 1 Cans of beer per week    Comment: occassionally   Drug use: No   Sexual activity: Not on file  Other Topics Concern   Not on file  Social History Narrative   Lives at home with wife, is retired.  Education: 2 yrs college.   2 Children.   Social Determinants of Health    Financial Resource Strain: Not on file  Food Insecurity: Not on file  Transportation Needs: Not on file  Physical Activity: Not on file  Stress: Not on file  Social Connections: Not on file  Intimate Partner Violence: Not on file    PHYSICAL EXAM  Vitals:   03/07/21 0721  BP: 132/72  Pulse: 81  Weight: 200 lb 8 oz (90.9 kg)  Height: 5\' 10"  (1.778 m)   Body mass index is 28.77 kg/m.  Generalized: Well developed, in no acute distress  Neurological examination  Mentation: Alert oriented to time, place, history taking. Follows all commands speech and language fluent Cranial nerve II-XII: Pupils were equal round reactive to light. Extraocular movements were full, visual field were full on confrontational test. Facial sensation and strength were normal.  Head turning and shoulder shrug were normal and symmetric. Motor: Good strength all extremities, exception mild left hip flexion 4/5 weakness, mild decreased left hand grip  Sensory: Decreased soft touch sensation to the left face, arm, leg. Both hands are cool to touch, but strong radial pulses bilaterally Coordination: tremor with left finger nose finger more the right is mild, slight dysmetria on the left  Gait and station: Gait is slightly wide-based, limp on the right, tandem gait is unsteady Reflexes: Deep tendon reflexes are symmetric and normal bilaterally.   DIAGNOSTIC DATA (LABS, IMAGING, TESTING) - I reviewed patient records, labs, notes, testing and imaging myself where available.  Lab Results  Component Value Date   WBC 9.1 10/06/2020   HGB 15.5 10/06/2020   HCT 46.6 10/06/2020   MCV 83.8 10/06/2020   PLT 284 10/06/2020      Component Value Date/Time   NA 132 (L) 10/06/2020 0832   NA 135 12/13/2016 0942   K 4.7 10/06/2020 0832   CL 99 10/06/2020 0832   CO2 23 10/06/2020 0832   GLUCOSE 101 (H) 10/06/2020 0832   BUN 16 10/06/2020 0832   BUN 16 12/13/2016 0942   CREATININE 1.00 10/06/2020 0832   CALCIUM  9.4 10/06/2020  0832   PROT 7.0 12/13/2016 0942   ALBUMIN 4.5 12/13/2016 0942   AST 13 12/13/2016 0942   ALT 37 11/06/2019 0954   ALKPHOS 98 12/13/2016 0942   BILITOT 0.8 12/13/2016 0942   GFRNONAA >60 10/06/2020 0832   GFRAA >60 05/02/2020 0909   Lab Results  Component Value Date   CHOL 200 (H) 11/06/2019   HDL 47 11/06/2019   LDLCALC 122 (H) 11/06/2019   TRIG 177 (H) 11/06/2019   CHOLHDL 4.3 11/06/2019   No results found for: HGBA1C No results found for: VITAMINB12 Lab Results  Component Value Date   TSH 1.060 12/13/2016   ASSESSMENT AND PLAN 70 y.o. year old male  has a past medical history of Carpal tunnel syndrome on right (07/08/2017), Cervical radiculopathy at C6 (8/56/3149), Complication of anesthesia, Coronary artery disease, Enlarged prostate, H/O: knee surgery, History of open heart surgery, Ischemic stroke of frontal lobe ([place]) (03/14/2017), PONV (postoperative nausea and vomiting), Seizure disorder ([place]) (03/14/2017), Seizures ([place]), and Status post knee surgery. here with:  1.  Right C6 radiculopathy   2.  History of seizures   3.  Essential tremor  4. History of bifrontal strokes  -Spell on Saturday, reported feeling off, shaking to all extremities for 1 hour, there was no loss of consciousness, afterwards has felt weak, blurry vision to the left eye, more numbness than baseline to both upper extremities, balance is off, just doesn't feel like himself  -Unclear exactly what happened, but will order MRI of the brain without contrast, rule out acute stroke given history of bifrontal strokes, spell sounds like rigors from infection, but denies fever, of note on Day 8 on Augmentin for sinus infection but is feeling much better, lungs are clear, no cough  -Check CBC with diff, CMP, urine, check for infection, metabolic work-up   -Does not seem like seizure at this point, will remain on Keppra 750 mg twice daily  -Will await labs and imaging, follow-up in 3-4 months  if [place] has opening if not, with another NP, from that point can establish with another neurologist as primary   I spent 35 minutes of face-to-face and non-face-to-face time with patient.  This included previsit chart review, lab review, study review, order entry, discussing recent spell, management, medications, and follow-up.   Butler Denmark, AGNP-C, DNP 03/07/2021, 7:55 AM [facility] [address] [place], [place] [address] [phone]

## 2021-03-06 NOTE — Telephone Encounter (Signed)
I called the patient.  The patient is requesting a waiver for jury duty, he reports short-term memory problems.  He was seen through occupational therapy who recommended speech therapy for cognitive training in January 2019.  This was done.  He has reported problems getting lost, unable to find his car in a parking lot.  He has a history of head trauma previously.  I will go ahead and fill out the form to excuse him from jury duty.

## 2021-03-07 ENCOUNTER — Other Ambulatory Visit: Payer: Self-pay

## 2021-03-07 ENCOUNTER — Encounter: Payer: Self-pay | Admitting: Neurology

## 2021-03-07 ENCOUNTER — Ambulatory Visit (INDEPENDENT_AMBULATORY_CARE_PROVIDER_SITE_OTHER): Payer: Medicare HMO | Admitting: Neurology

## 2021-03-07 ENCOUNTER — Telehealth: Payer: Self-pay | Admitting: Neurology

## 2021-03-07 VITALS — BP 132/72 | HR 81 | Ht 70.0 in | Wt 200.5 lb

## 2021-03-07 DIAGNOSIS — G250 Essential tremor: Secondary | ICD-10-CM | POA: Diagnosis not present

## 2021-03-07 DIAGNOSIS — R531 Weakness: Secondary | ICD-10-CM

## 2021-03-07 DIAGNOSIS — M5412 Radiculopathy, cervical region: Secondary | ICD-10-CM | POA: Diagnosis not present

## 2021-03-07 DIAGNOSIS — R569 Unspecified convulsions: Secondary | ICD-10-CM

## 2021-03-07 DIAGNOSIS — I639 Cerebral infarction, unspecified: Secondary | ICD-10-CM

## 2021-03-07 NOTE — Progress Notes (Signed)
I have read the note, and I agree with the clinical assessment and plan.  Jaia Alonge K Saharah Sherrow   

## 2021-03-07 NOTE — Telephone Encounter (Addendum)
MRI brain wo contrast [employer] Medicare auth: NPR via rep ref # [id number]  Sent to [facility] for scheduling (per pt request)

## 2021-03-07 NOTE — Telephone Encounter (Signed)
Patient picked up letter today.

## 2021-03-07 NOTE — Patient Instructions (Signed)
Will check labs today  Order MRI of the brain  See you back in 3-4 months

## 2021-03-08 ENCOUNTER — Encounter: Payer: Self-pay | Admitting: Neurology

## 2021-03-08 LAB — COMPREHENSIVE METABOLIC PANEL
ALT: 14 IU/L (ref 0–44)
AST: 16 IU/L (ref 0–40)
Albumin/Globulin Ratio: 1.5 (ref 1.2–2.2)
Albumin: 4.2 g/dL (ref 3.8–4.8)
Alkaline Phosphatase: 102 IU/L (ref 44–121)
BUN/Creatinine Ratio: 13 (ref 10–24)
BUN: 13 mg/dL (ref 8–27)
Bilirubin Total: 0.5 mg/dL (ref 0.0–1.2)
CO2: 21 mmol/L (ref 20–29)
Calcium: 9.3 mg/dL (ref 8.6–10.2)
Chloride: 95 mmol/L — ABNORMAL LOW (ref 96–106)
Creatinine, Ser: 0.97 mg/dL (ref 0.76–1.27)
Globulin, Total: 2.8 g/dL (ref 1.5–4.5)
Glucose: 102 mg/dL — ABNORMAL HIGH (ref 65–99)
Potassium: 4.5 mmol/L (ref 3.5–5.2)
Sodium: 135 mmol/L (ref 134–144)
Total Protein: 7 g/dL (ref 6.0–8.5)
eGFR: 85 mL/min/{1.73_m2} (ref >59)

## 2021-03-08 LAB — CBC WITH DIFFERENTIAL/PLATELET
Basophils Absolute: 0.1 10*3/uL (ref 0.0–0.2)
Basos: 1 %
EOS (ABSOLUTE): 0.2 10*3/uL (ref 0.0–0.4)
Eos: 2 %
Hematocrit: 42.6 % (ref 37.5–51.0)
Hemoglobin: 14 g/dL (ref 13.0–17.7)
Immature Grans (Abs): 0.1 10*3/uL (ref 0.0–0.1)
Immature Granulocytes: 1 %
Lymphocytes Absolute: 1.8 10*3/uL (ref 0.7–3.1)
Lymphs: 18 %
MCH: 26.8 pg (ref 26.6–33.0)
MCHC: 32.9 g/dL (ref 31.5–35.7)
MCV: 82 fL (ref 79–97)
Monocytes Absolute: 0.9 10*3/uL (ref 0.1–0.9)
Monocytes: 10 %
Neutrophils Absolute: 6.7 10*3/uL (ref 1.4–7.0)
Neutrophils: 68 %
Platelets: 299 10*3/uL (ref 150–450)
RBC: 5.23 x10E6/uL (ref 4.14–5.80)
RDW: 14.1 % (ref 11.6–15.4)
WBC: 9.7 10*3/uL (ref 3.4–10.8)

## 2021-03-08 LAB — URINALYSIS
Bilirubin, UA: NEGATIVE
Glucose, UA: NEGATIVE
Leukocytes,UA: NEGATIVE
Nitrite, UA: NEGATIVE
RBC, UA: NEGATIVE
Specific Gravity, UA: 1.023 (ref 1.005–1.030)
Urobilinogen, Ur: 0.2 mg/dL (ref 0.2–1.0)
pH, UA: 6 (ref 5.0–7.5)

## 2021-03-11 ENCOUNTER — Ambulatory Visit (HOSPITAL_BASED_OUTPATIENT_CLINIC_OR_DEPARTMENT_OTHER): Payer: Medicare HMO

## 2021-03-11 ENCOUNTER — Other Ambulatory Visit: Payer: Medicare HMO

## 2021-03-11 ENCOUNTER — Ambulatory Visit (INDEPENDENT_AMBULATORY_CARE_PROVIDER_SITE_OTHER): Payer: Medicare HMO

## 2021-03-11 ENCOUNTER — Other Ambulatory Visit: Payer: Self-pay

## 2021-03-11 DIAGNOSIS — R29818 Other symptoms and signs involving the nervous system: Secondary | ICD-10-CM

## 2021-03-11 DIAGNOSIS — Z8673 Personal history of transient ischemic attack (TIA), and cerebral infarction without residual deficits: Secondary | ICD-10-CM

## 2021-03-11 DIAGNOSIS — M25561 Pain in right knee: Secondary | ICD-10-CM

## 2021-03-11 DIAGNOSIS — R531 Weakness: Secondary | ICD-10-CM | POA: Diagnosis not present

## 2021-03-11 DIAGNOSIS — G8929 Other chronic pain: Secondary | ICD-10-CM | POA: Diagnosis not present

## 2021-03-13 ENCOUNTER — Telehealth: Payer: Self-pay | Admitting: Neurology

## 2021-03-13 ENCOUNTER — Encounter: Payer: Self-pay | Admitting: Neurology

## 2021-03-13 NOTE — Telephone Encounter (Signed)
I called the patient.  MRI of the brain shows chronic frontal lobe infarcts, known from previous history.  Minimal white matter changes otherwise noted.  The history given by the patient is most consistent with rigors, if this recurs, he may need a work-up for an abscess somewhere in his body.   MRI brain 03/10/21:  IMPRESSION: 1. No acute intracranial abnormality. 2. Remote frontal lobe infarcts bilaterally. 3. Periventricular and subcortical T2 hyperintensities bilaterally are mildly advanced for age. The finding is nonspecific but can be seen in the setting of chronic microvascular ischemia, a demyelinating process such as multiple sclerosis, vasculitis, complicated migraine headaches, or as the sequelae of a prior infectious or inflammatory process.

## 2021-03-14 ENCOUNTER — Encounter: Payer: Self-pay | Admitting: Orthopaedic Surgery

## 2021-03-14 ENCOUNTER — Ambulatory Visit (INDEPENDENT_AMBULATORY_CARE_PROVIDER_SITE_OTHER): Payer: Medicare HMO | Admitting: Orthopaedic Surgery

## 2021-03-14 DIAGNOSIS — S83281A Other tear of lateral meniscus, current injury, right knee, initial encounter: Secondary | ICD-10-CM | POA: Diagnosis not present

## 2021-03-14 NOTE — Progress Notes (Signed)
Office Visit Note   Patient: Erik Salazar           Date of Birth: 1950-11-14           MRN: [id number] Visit Date: 03/14/2021              Requested by: Orpah Melter, MD [address] [place],  [place] [address] PCP: Orpah Melter, MD   Assessment & Plan: Visit Diagnoses:  1. Acute lateral meniscus tear of right knee, initial encounter     Plan: Magic continues to have constant locking and fear of falling and giving way.  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  Treatment options discussed to include knee scope and total knee replacement or trying cortisone injections and a knee brace.  He has not had any relief from prior injections and is not interested in doing more injections.  He is not quite ready for knee replacement but he would like to try a knee scope to see if this will alleviate his symptoms do not fully prevent having a knee replacement.  He would like to have this done sometime in November.  He will need preoperative cardiac clearance for surgery.  We will be in touch with the patient in the near future.  Questions encouraged and answered.  Risk benefits rehab recovery reviewed including incomplete pain relief.  Follow-Up Instructions: No follow-ups on file.   Orders:  No orders of the defined types were placed in this encounter.  No orders of the defined types were placed in this encounter.     Procedures: No procedures performed   Clinical Data: No additional findings.   Subjective: Chief Complaint  Patient presents with   Right Knee - Pain    [name] returns today for MRI review of the right knee.  Continues to have locking sensation constantly and fearful of falling.   Review of Systems   Objective: Vital Signs: There were no vitals taken for this visit.  Physical Exam  Ortho Exam Right knee exam is unchanged. Specialty Comments:   No specialty comments available.  Imaging: No results found.   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   Thoracic aortic aneurysm without rupture ([place]) 10/14/2020   Recurrent incisional hernia 10/14/2020   Spinal stenosis in cervical region 05/04/2020   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   Cervical radiculopathy at C6 11/14/2017   Carpal tunnel syndrome on right 07/08/2017   Tremor, essential 07/08/2017   Ischemic stroke of frontal lobe ([place]) 04/05/2017   Pain in right hand 04/05/2017   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 03/27/2017   Seizure ([place]) 03/14/2017   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/13/2017   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   Essential hypertension 02/26/2017   Mixed hyperlipidemia 02/26/2017   Past Medical History:  Diagnosis Date   Carpal tunnel syndrome on right 07/08/2017   Cervical radiculopathy at C6 [id number]   Right   Complication of anesthesia    Coronary artery disease  Enlarged prostate    H/O: knee surgery    15    History of open heart surgery    ASCENDING AORTIC ANEURYSM   Ischemic stroke of frontal lobe ([place]) 03/14/2017   Bilateral; post-redo CT surgery   PONV (postoperative nausea and vomiting)    only after CABG surgeries   Seizure disorder ([place]) 03/14/2017   Seizures ([place])    Status post knee surgery    DVT POST KNEE SURGERY    Family History  Problem Relation Age of Onset   Aneurysm Mother        brain aneurysm for mother.     Past Surgical History:  Procedure Laterality Date   ANTERIOR  CERVICAL DECOMP/DISCECTOMY FUSION N/A 05/04/2020   Procedure: Anterior Cervical Decompression/Discectomy Fuion Cervical three-four, Cervical four-five, Cervical five-six;  Surgeon: Kary Kos, MD;  Location: [facility];  Service: Neurosurgery;  Laterality: N/A;   BALLOON DILATION N/A 06/23/2019   Procedure: BALLOON DILATION;  Surgeon: Otis Brace, MD;  [facility];  Service: Gastroenterology;  Laterality: N/A;   BENTALL PROCEDURE  01/04/2016   Bentall with 23 mm pericardial AVR; SVG-LAD, SVG-CX ([place])   BIOPSY  06/23/2019   Procedure: BIOPSY;  Surgeon: Otis Brace, MD;  [facility];  Service: Gastroenterology;;   CARDIAC SURGERY     ANUERSYM MAY 2017   Bentall procedure. Bioprosthetic aortic valve #23 mm bovine model #2700 TF ask, and 28 mm Gelweave woven vascular sinus of Valsalva graft   CARPAL TUNNEL RELEASE  08/2018   right hand    CORONARY ARTERY BYPASS GRAFT  01/04/2016   VG to LAD & VG to LCX   CORONARY ARTERY BYPASS GRAFT  03/13/2017   LIMA to LAD with steril abcess removal from dacron graft   ESOPHAGOGASTRODUODENOSCOPY (EGD) WITH PROPOFOL N/A 06/23/2019   Procedure: ESOPHAGOGASTRODUODENOSCOPY (EGD) WITH PROPOFOL;  Surgeon: Otis Brace, MD;  [facility];  Service: Gastroenterology;  Laterality: N/A;   FALSE ANEURYSM REPAIR  03/13/2017   redo sternotomy, sterile abscess removal from Dacron graft, omental flap around aorta, CABG: LIMA-LAD ([name], Dr. Mart Piggs)   [place]  2018   Of pericardium 2018   INSERTION OF MESH  10/14/2020   Procedure: INSERTION OF MESH;  Surgeon: Michael Boston, MD;  Location: [facility];  Service: General;;   knee surgeries     13 surgeries on knee done before knee replacement    [place] N/A 10/14/2020   Procedure: LYSIS OF ADHESION;  Surgeon: Michael Boston, MD;  Location: [facility];  Service: General;  Laterality: N/A;   open heart surgery      03-13-2017   REPLACEMENT TOTAL KNEE  2015   ROTATOR CUFF REPAIR     TONSILLECTOMY     removed as a child.   VENTRAL HERNIA REPAIR N/A 10/14/2020   Procedure: LAPAROSCOPIC VENTRAL HERNIA REPAIR WITH TAP BLOCK BILATERAL;  Surgeon: Michael Boston, MD;  Location: [facility];  Service: General;  Laterality: N/A;   Social History   Occupational History   Occupation: RETIRED  Tobacco Use   Smoking status: Former    Types: Cigars   Smokeless tobacco: Never  Vaping Use   Vaping Use: Never used  Substance and Sexual Activity   Alcohol use: Yes    Alcohol/week: 1.0 standard drink    Types: 1 Cans of beer per week    Comment: occassionally   Drug use: No   Sexual activity: Not  on file

## 2021-03-16 ENCOUNTER — Telehealth: Payer: Self-pay | Admitting: *Deleted

## 2021-03-16 NOTE — Telephone Encounter (Signed)
   [facility] Pre-operative Risk Assessment    Patient Name: Erik Salazar  DOB: 12-18-1950 MRN: [id number]  HEARTCARE STAFF:  - IMPORTANT!!!!!! Under Visit Info/Reason for Call, type in Other and utilize the format Clearance MM/DD/YY or Clearance TBD. Do not use dashes or single digits. - Please review there is not already an duplicate clearance open for this procedure. - If request is for dental extraction, please clarify the # of teeth to be extracted. - If the patient is currently at the dentist's office, call Pre-Op Callback Staff (MA/nurse) to input urgent request.  - If the patient is not currently in the dentist office, please route to the Pre-Op pool.  Request for surgical clearance:  What type of surgery is being performed? RIGHT KNEE ARTHROSCOPY  When is this surgery scheduled? TBD  What type of clearance is required (medical clearance vs. Pharmacy clearance to hold med vs. Both)? MEDICAL  Are there any medications that need to be held prior to surgery and how long?  ASA   Practice name and name of physician performing surgery? [facility]; DR. Mosetta Pigeon [facility]  What is the office phone number? [phone]   7.   What is the office fax number? [place].   Anesthesia type (None, local, MAC, general) ? GENERAL   Julaine Hua 03/16/2021, 5:03 PM  _________________________________________________________________   (provider comments below)

## 2021-03-17 NOTE — Telephone Encounter (Signed)
Erik Salazar 70 year old male is requesting preoperative cardiac evaluation for right knee arthroscopy.  He was last seen in the clinic on 11/14/2020.  He presented for follow-up after TAA repair with redo sternotomy.  He denied fever chills nausea vomiting and was overall doing well.  He had also had ventral hernia repair by Dr. Johney Maine.  He was exercising, walking a 5K daily and had lost 15 pounds.  His PMH also includes a sending aortic aneurysm with dental procedure and CABG in [place] 2017, redo sternotomy due to 2018 (9 cm abscess collection along aortic graft) repeat coronary graft using left IMA-LAD, and CVA.  May his aspirin be held prior to his surgery?  Thank you for your help.  Please direct your response to [facility] preop pool.  Jossie Ng. Mihcael Ledee NP-C    03/17/2021, 7:25 AM [facility] [address] Office [phone] Fax [phone]

## 2021-03-22 NOTE — Telephone Encounter (Signed)
   Name: Erik Salazar  DOB: [birth year]  MRN: [id number]   Primary Cardiologist: Candee Furbish, MD  Chart reviewed as part of pre-operative protocol coverage. Patient was contacted 03/22/2021 in reference to pre-operative risk assessment for pending surgery as outlined below.  Erik Salazar was last seen on 11/14/2020 by Dr. Marlou Porch.  Since that day, Erik Salazar has done well without any exertional chest pain or worsening dyspnea.  Per Dr. Marlou Porch, he may hold aspirin for 5 days prior to the surgery and restart as soon as possible afterward at the surgeon's discretion.  Therefore, based on ACC/AHA guidelines, the patient would be at acceptable risk for the planned procedure without further cardiovascular testing.   The patient was advised that if he develops new symptoms prior to surgery to contact our office to arrange for a follow-up visit, and he verbalized understanding.  I will route this recommendation to the requesting party via Epic fax function and remove from pre-op pool. Please call with questions.  [place], [place] 03/22/2021, 3:17 PM

## 2021-04-07 ENCOUNTER — Other Ambulatory Visit (HOSPITAL_BASED_OUTPATIENT_CLINIC_OR_DEPARTMENT_OTHER): Payer: Self-pay | Admitting: Neurosurgery

## 2021-04-07 DIAGNOSIS — M4316 Spondylolisthesis, lumbar region: Secondary | ICD-10-CM

## 2021-04-08 ENCOUNTER — Ambulatory Visit (HOSPITAL_BASED_OUTPATIENT_CLINIC_OR_DEPARTMENT_OTHER)
Admission: RE | Admit: 2021-04-08 | Discharge: 2021-04-08 | Disposition: A | Discharge status: Home / Self Care | Payer: Medicare HMO | Source: Ambulatory Visit | Attending: Neurosurgery | Admitting: Neurosurgery

## 2021-04-08 ENCOUNTER — Other Ambulatory Visit: Payer: Self-pay

## 2021-04-08 DIAGNOSIS — M4316 Spondylolisthesis, lumbar region: Secondary | ICD-10-CM | POA: Insufficient documentation

## 2021-04-11 ENCOUNTER — Telehealth: Payer: Self-pay | Admitting: *Deleted

## 2021-04-11 NOTE — Telephone Encounter (Signed)
   [facility] Pre-operative Risk Assessment    Patient Name: Erik Salazar  DOB: 11-14-1950 MRN: [id number]  HEARTCARE STAFF:  - IMPORTANT!!!!!! Under Visit Info/Reason for Call, type in Other and utilize the format Clearance MM/DD/YY or Clearance TBD. Do not use dashes or single digits. - Please review there is not already an duplicate clearance open for this procedure. - If request is for dental extraction, please clarify the # of teeth to be extracted. - If the patient is currently at the dentist's office, call Pre-Op Callback Staff (MA/nurse) to input urgent request.  - If the patient is not currently in the dentist office, please route to the Pre-Op pool.  Request for surgical clearance:  What type of surgery is being performed? LAP. VENTRAL HERNIA REPAIR  When is this surgery scheduled? TBD (PT INTERESTED IN HAVING ASAP)  What type of clearance is required (medical clearance vs. Pharmacy clearance to hold med vs. Both)? MEDICAL  Are there any medications that need to be held prior to surgery and how long? ASA  Practice name and name of physician performing surgery? [facility]; SURGEON NOT LISTED  What is the office phone number? [phone]   7.   What is the office fax number? Goehner, CMA  8.   Anesthesia type (None, local, MAC, general) ? GENERAL   Julaine Hua 04/11/2021, 5:08 PM  _________________________________________________________________   (provider comments below)

## 2021-04-14 NOTE — Telephone Encounter (Signed)
    Patient Name: Erik Salazar  DOB: Aug 15, 1951 MRN: [id number]  Primary Cardiologist: Candee Furbish, MD  Chart reviewed as part of pre-operative protocol coverage. Patient was contacted 04/14/2021 in reference to pre-operative risk assessment for pending surgery as outlined below. Erik Salazar was last seen on 11/14/2020 by Dr. Marlou Porch. Since that day, Erik Salazar has done well without any exertional chest pain or worsening dyspnea. Dr. Marlou Porch has previously agreed to holding ASA 5 days prior to knee surgery 03/22/21. In talking with the patient today, the plan is to undergo hernia surgery prior to knee surgery. He again, may hold his ASA 5 days prior to procedure and restart as soon as possible afterward at the surgeon's discretion.   Therefore, based on ACC/AHA guidelines, the patient would be at acceptable risk for the planned procedure without further cardiovascular testing.  The patient was advised that if he develops new symptoms prior to surgery to contact our office to arrange for a follow-up visit, and he verbalized understanding.  I will route this recommendation to the requesting party via Epic fax function and remove from pre-op pool.  Please call with questions.  Kathyrn Drown, NP 04/14/2021, 4:17 PM

## 2021-05-09 ENCOUNTER — Telehealth: Payer: Self-pay | Admitting: Orthopaedic Surgery

## 2021-05-09 NOTE — Telephone Encounter (Signed)
Ok that sounds good.  Thanks.

## 2021-05-09 NOTE — Telephone Encounter (Signed)
I called patient to offer a date for right knee arthroscopy with Dr. Erlinda Hong.  Patient's wife said Mr. Erik Salazar is scheduled for hernia repair at the end of September. He also has plans to have spine surgery with Dr. Saintclair Halsted about 6-8 weeks post hernia surgery. Patient's wife Raiford Noble took my information (direct number) and will call back when ready to schedule the knee scope.

## 2021-05-10 ENCOUNTER — Ambulatory Visit: Payer: Medicare HMO | Attending: Internal Medicine

## 2021-05-10 DIAGNOSIS — Z23 Encounter for immunization: Secondary | ICD-10-CM

## 2021-05-10 NOTE — Progress Notes (Signed)
   Covid-19 Vaccination Clinic  Name:  Erik Salazar    MRN: [id number] DOB: 1950/09/27  05/10/2021  [name] was observed post Covid-19 immunization for 15 minutes without incident. He was provided with Vaccine Information Sheet and instruction to access the V-Safe system.   [name] was instructed to call 911 with any severe reactions post vaccine: Difficulty breathing  Swelling of face and throat  A fast heartbeat  A bad rash all over body  Dizziness and weakness

## 2021-05-10 NOTE — Telephone Encounter (Signed)
FYI

## 2021-05-15 ENCOUNTER — Ambulatory Visit: Payer: Self-pay | Admitting: Surgery

## 2021-05-16 ENCOUNTER — Encounter (HOSPITAL_COMMUNITY): Payer: Self-pay

## 2021-05-16 ENCOUNTER — Encounter (HOSPITAL_COMMUNITY)
Admission: RE | Admit: 2021-05-16 | Discharge: 2021-05-16 | Disposition: A | Discharge status: Home / Self Care | Payer: Medicare HMO | Source: Ambulatory Visit | Attending: Surgery | Admitting: Surgery

## 2021-05-16 ENCOUNTER — Other Ambulatory Visit: Payer: Self-pay

## 2021-05-16 ENCOUNTER — Other Ambulatory Visit (HOSPITAL_BASED_OUTPATIENT_CLINIC_OR_DEPARTMENT_OTHER): Payer: Self-pay

## 2021-05-16 DIAGNOSIS — Z8673 Personal history of transient ischemic attack (TIA), and cerebral infarction without residual deficits: Secondary | ICD-10-CM | POA: Insufficient documentation

## 2021-05-16 DIAGNOSIS — I739 Peripheral vascular disease, unspecified: Secondary | ICD-10-CM | POA: Insufficient documentation

## 2021-05-16 DIAGNOSIS — Z7982 Long term (current) use of aspirin: Secondary | ICD-10-CM | POA: Diagnosis not present

## 2021-05-16 DIAGNOSIS — Z951 Presence of aortocoronary bypass graft: Secondary | ICD-10-CM | POA: Diagnosis not present

## 2021-05-16 DIAGNOSIS — Z20822 Contact with and (suspected) exposure to covid-19: Secondary | ICD-10-CM | POA: Insufficient documentation

## 2021-05-16 DIAGNOSIS — M5412 Radiculopathy, cervical region: Secondary | ICD-10-CM | POA: Insufficient documentation

## 2021-05-16 DIAGNOSIS — Z01812 Encounter for preprocedural laboratory examination: Secondary | ICD-10-CM | POA: Insufficient documentation

## 2021-05-16 DIAGNOSIS — I2510 Atherosclerotic heart disease of native coronary artery without angina pectoris: Secondary | ICD-10-CM | POA: Insufficient documentation

## 2021-05-16 DIAGNOSIS — Z952 Presence of prosthetic heart valve: Secondary | ICD-10-CM | POA: Insufficient documentation

## 2021-05-16 DIAGNOSIS — I10 Essential (primary) hypertension: Secondary | ICD-10-CM | POA: Insufficient documentation

## 2021-05-16 LAB — BASIC METABOLIC PANEL
Anion gap: 7 (ref 5–15)
BUN: 15 mg/dL (ref 8–23)
CO2: 27 mmol/L (ref 22–32)
Calcium: 9.3 mg/dL (ref 8.9–10.3)
Chloride: 98 mmol/L (ref 98–111)
Creatinine, Ser: 0.99 mg/dL (ref 0.61–1.24)
GFR, Estimated: >60 mL/min (ref >60)
Glucose, Bld: 102 mg/dL — ABNORMAL HIGH (ref 70–99)
Potassium: 4.6 mmol/L (ref 3.5–5.1)
Sodium: 132 mmol/L — ABNORMAL LOW (ref 135–145)

## 2021-05-16 LAB — CBC
HCT: 48.6 % (ref 39.0–52.0)
Hemoglobin: 15.5 g/dL (ref 13.0–17.0)
MCH: 27.4 pg (ref 26.0–34.0)
MCHC: 31.9 g/dL (ref 30.0–36.0)
MCV: 86 fL (ref 80.0–100.0)
Platelets: 258 10*3/uL (ref 150–400)
RBC: 5.65 MIL/uL (ref 4.22–5.81)
RDW: 14.5 % (ref 11.5–15.5)
WBC: 10 10*3/uL (ref 4.0–10.5)
nRBC: 0 % (ref 0.0–0.2)

## 2021-05-16 LAB — SARS CORONAVIRUS 2 (TAT 6-24 HRS): SARS Coronavirus 2: NEGATIVE

## 2021-05-16 MED ORDER — COVID-19MRNA BIVAL VACC PFIZER 30 MCG/0.3ML IM SUSP
INTRAMUSCULAR | 0 refills | Status: DC
  Filled 2021-05-16: qty 0.3, 1d supply, fill #0

## 2021-05-16 NOTE — Progress Notes (Signed)
Surgical Instructions    Your procedure is scheduled on 05/19/21.  Report to [facility] Main Entrance "A" at 12:30 P.M., then check in with the Admitting office.  Call this number if you have problems the morning of surgery:  [phone]   If you have any questions prior to your surgery date call [phone]: Open Monday-Friday 8am-4pm    Remember:  Do not eat after midnight the night before your surgery  You may drink clear liquids until 11:30 the morning of your surgery.   Clear liquids allowed are: Water, Non-Citrus Juices (without pulp), Carbonated Beverages, Clear Tea, Black Coffee ONLY (NO MILK, CREAM OR POWDERED CREAMER of any kind), and Gatorade  Please complete your PRE-SURGERY ENSURE that was provided to you by 11:30 the morning of surgery.  Please, if able, drink it in one setting. DO NOT SIP.     Take these medicines the morning of surgery with A SIP OF WATER: levETIRAcetam (KEPPRA) metoprolol succinate (TOPROL-XL) acetaminophen (TYLENOL) - if needed    As of today, STOP taking any Aleve, Naproxen, Ibuprofen, Motrin, Advil, Goody's, BC's, all herbal medications, fish oil, and all vitamins. Follow surgeon's instructions for aspirin.           Do not wear jewelry  Do not wear lotions, powders, colognes, or deodorant. Do not shave 48 hours prior to surgery.  Men may shave face and neck. Do not bring valuables to the hospital.              [facility] is not responsible for any belongings or valuables.  Do NOT Smoke (Tobacco/Vaping)  24 hours prior to your procedure If you use a CPAP at night, you may bring your mask for your overnight stay.   Contacts, glasses, dentures or bridgework may not be worn into surgery, please bring cases for these belongings   For patients admitted to the hospital, discharge time will be determined by your treatment team.   Patients discharged the day of surgery will not be allowed to drive home, and someone needs to stay with them for  24 hours.  NO VISITORS WILL BE ALLOWED IN PRE-OP WHERE PATIENTS GET READY FOR SURGERY.  ONLY 1 SUPPORT PERSON MAY BE PRESENT WHILE YOU ARE IN SURGERY.  IF YOU ARE TO BE ADMITTED, ONCE YOU ARE IN YOUR ROOM YOU WILL BE ALLOWED TWO (2) VISITORS.  Minor children may have two parents present. Special consideration for safety and communication needs will be reviewed on a case by case basis.  Special instructions:    Oral Hygiene is also important to reduce your risk of infection.  Remember - BRUSH YOUR TEETH THE MORNING OF SURGERY WITH YOUR REGULAR TOOTHPASTE   [facility]- Preparing For Surgery  Before surgery, you can play an important role. Because skin is not sterile, your skin needs to be as free of germs as possible. You can reduce the number of germs on your skin by washing with CHG (chlorahexidine gluconate) Soap before surgery.  CHG is an antiseptic cleaner which kills germs and bonds with the skin to continue killing germs even after washing.     Please do not use if you have an allergy to CHG or antibacterial soaps. If your skin becomes reddened/irritated stop using the CHG.  Do not shave (including legs and underarms) for at least 48 hours prior to first CHG shower. It is OK to shave your face.  Please follow these instructions carefully.     Shower the [employer] BEFORE SURGERY  and the MORNING OF SURGERY with CHG Soap.   If you chose to wash your hair, wash your hair first as usual with your normal shampoo. After you shampoo, rinse your hair and body thoroughly to remove the shampoo.  Then [employer] and genitals (private parts) with your normal soap and rinse thoroughly to remove soap.  After that Use CHG Soap as you would any other liquid soap. You can apply CHG directly to the skin and wash gently with a scrungie or a clean washcloth.   Apply the CHG Soap to your body ONLY FROM THE NECK DOWN.  Do not use on open wounds or open sores. Avoid contact with your eyes, ears, mouth and genitals  (private parts). Wash Face and genitals (private parts)  with your normal soap.   Wash thoroughly, paying special attention to the area where your surgery will be performed.  Thoroughly rinse your body with warm water from the neck down.  DO NOT shower/wash with your normal soap after using and rinsing off the CHG Soap.  Pat yourself dry with a CLEAN TOWEL.  Wear CLEAN PAJAMAS to bed the night before surgery  Place CLEAN SHEETS on your bed the night before your surgery  DO NOT SLEEP WITH PETS.   Day of Surgery:  Take a shower with CHG soap. Wear Clean/Comfortable clothing the morning of surgery Do not apply any deodorants/lotions.   Remember to brush your teeth WITH YOUR REGULAR TOOTHPASTE.   Please read over the following fact sheets that you were given.

## 2021-05-16 NOTE — Progress Notes (Signed)
PCP: Orpah Melter, MD Cardiologist: Candee Furbish, MD  EKG: 10/06/20 CXR: na ECHO: 12/08/20 Stress Test: denies Cardiac Cath: 2017  Fasting Blood Sugar- na Checks Blood Sugar_na__ times a day  OSA/CPAP: No  ASA: Last dose 05/05/21 Blood Thinner: No  Covid test 05/16/21 at PAT  Anesthesia Review: Yes, cardiac history  Patient denies shortness of breath, fever, cough, and chest pain at PAT appointment.  Patient verbalized understanding of instructions provided today at the PAT appointment.  Patient asked to review instructions at home and day of surgery.

## 2021-05-18 NOTE — Progress Notes (Signed)
Anesthesia chart review  Erik Salazar is a 70 yo male with PMHx of cervical radiculopathy s/p C3-6 ACDF 9/41/74 without complication, HTN, CAD s/p CABG and Bentall, seizures, PVD.   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 Matthias Salazar; s/p redo median sternotomy, sterile abscess removal from Dacron graft, omental flap around aorta, CABGx1: LIMA-LAD on 03/13/17 [name], Dr. 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  Last seen by Dr. Marlou Salazar 11/14/20, and at the time was doing well with no complaints. stable at that time, recommended 1 year followup.    Preop Cardiology clearance 04/14/21. "Chart reviewed as part of pre-operative protocol coverage. Patient was contacted 04/14/2021 in reference to pre-operative risk assessment for pending surgery as outlined below. Erik Salazar was last seen on 11/14/2020 by Dr. Marlou Salazar. Since that day, Erik Salazar has done well without any exertional chest pain or worsening dyspnea. Dr. Marlou Salazar has previously agreed to holding ASA 5 days prior to knee surgery 03/22/21. In talking with the patient today, the plan is to undergo hernia surgery prior to knee surgery. He again, may hold his ASA 5 days prior to procedure and restart as soon as possible afterward at the surgeon's discretion. Therefore, based on ACC/AHA guidelines, the patient would be at acceptable risk for the planned procedure without further cardiovascular testing."  Hx of CVA seizures and followed by Dr. Jannifer Salazar.  Last seen 03/07/2021.  Remains on Keppra.  MRI ordered at appointment and per Dr. Jannifer Salazar "MRI of the brain shows chronic frontal lobe infarcts, known from previous history.  Minimal white matter changes otherwise noted.The history given by the  patient is most consistent with rigors, if this recurs, he may need a work-up for an abscess somewhere in his body." This recommendation was based off complaint of overall body shaking he complained of at appt. Follow up in 3-4 months.   Preop labs reviewed, unremarkable.   EKG 10/06/20: NSR, RBBB, 75 bpm  TTE 12/08/20:  FINDINGS   Left Ventricle: Left ventricular ejection fraction, by estimation, is 55  to 60%. Left ventricular ejection fraction by 3D volume is 60 %. The left  ventricle has normal function. The left ventricle has no regional wall  motion abnormalities. The average  left ventricular global longitudinal strain is -20.1 %. The global  longitudinal strain is normal. The left ventricular internal cavity size  was normal in size. There is mild asymmetric left ventricular hypertrophy  of the basal-septal segment. Abnormal  (paradoxical) septal motion consistent with post-operative status. Left  ventricular diastolic parameters are consistent with Grade I diastolic  dysfunction (impaired relaxation).   MRI brain 03/10/21: IMPRESSION: 1. No acute intracranial abnormality. 2. Remote frontal lobe infarcts bilaterally. 3. Periventricular and subcortical T2 hyperintensities bilaterally are mildly advanced for age. The finding is nonspecific but can be seen in the setting of chronic microvascular ischemia, a demyelinating process such as multiple sclerosis, vasculitis, complicated migraine headaches, or as the sequelae of a prior infectious or inflammatory process.  MRI Lumbar spine without contrast:  IMPRESSION: No change is appreciated compared to the study of last year.   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 Pseudo disc herniation. Bilateral foraminal stenosis could possibly affect either or both exiting L5 nerves.   L3-4: Mild multifactorial stenosis secondary  to bulging of the disc and mild facet and ligamentous prominence. No visible  neural compression.   Other levels show disc bulges and facet degeneration but no apparent neural compressive stenosis.  CTA chest 03/09/20: IMPRESSION: 1. Stable postsurgical changes in the chest. The aortic graft is patent without complicating features. Negative for aortic dissection. 2. No acute abnormality in the chest. 3. Hyperdense cyst in left kidney upper pole. This is similar to the prior abdominal CT and probably represents a hemorrhagic or proteinaceous cyst.  Erik Bame, NP-C, CRNA Short Stay/Anesthesia 05/18/21

## 2021-05-18 NOTE — Anesthesia Preprocedure Evaluation (Addendum)
Anesthesia Evaluation  Patient identified by MRN, date of birth, ID band Patient awake    Reviewed: Allergy & Precautions, NPO status , Patient's Chart, lab work & pertinent test results  History of Anesthesia Complications (+) PONV and history of anesthetic complications  Airway Mallampati: III  TM Distance: >3 FB Neck ROM: Limited    Dental  (+) Dental Advisory Given   Pulmonary former smoker,    breath sounds clear to auscultation       Cardiovascular hypertension, Pt. on home beta blockers and Pt. on medications + CAD and + CABG  + Valvular Problems/Murmurs (s/p AVR)  Rhythm:Regular Rate:Normal   '22 TTE - EF 55 to 60%. There is mild asymmetric left ventricular hypertrophy of the basal-septal segment. Grade I diastolic dysfunction (impaired relaxation).Right ventricular systolic function is mildly reduced. The right  ventricular size is mildly enlarged. Trivial mitral valve regurgitation. There is a 23 mm bovine valve present in the aortic position. Echo findings are consistent with normal structure and function of the aortic valve prosthesis. Aortic root/ascending aorta has been repaired/replaced s/p bentall procedure. Ascending aorta is normal in size measuring 3.1cm. There is mild dilatation of the aortic root, measuring 38 mm.     Neuro/Psych Seizures -,   Neuromuscular disease CVA negative psych ROS   GI/Hepatic Neg liver ROS, GERD  ,  Endo/Other   Na 132   Renal/GU negative Renal ROS     Musculoskeletal  (+) Arthritis ,   Abdominal   Peds  Hematology negative hematology ROS (+)   Anesthesia Other Findings   Reproductive/Obstetrics                          Lab Results  Component Value Date   WBC 10.0 05/16/2021   HGB 15.5 05/16/2021   HCT 48.6 05/16/2021   MCV 86.0 05/16/2021   PLT 258 05/16/2021   Lab Results  Component Value Date   CREATININE 0.99 05/16/2021   BUN 15  05/16/2021   NA 132 (L) 05/16/2021   K 4.6 05/16/2021   CL 98 05/16/2021   CO2 27 05/16/2021    Anesthesia Physical Anesthesia Plan  ASA: 3  Anesthesia Plan: General   Post-op Pain Management:    Induction: Intravenous  PONV Risk Score and Plan: 4 or greater and Treatment may vary due to age or medical condition, Ondansetron, Dexamethasone, Propofol infusion and Scopolamine patch - Pre-op  Airway Management Planned: Oral ETT and Video Laryngoscope Planned  Additional Equipment: None  Intra-op Plan:   Post-operative Plan: Extubation in OR  Informed Consent: I have reviewed the patients History and Physical, chart, labs and discussed the procedure including the risks, benefits and alternatives for the proposed anesthesia with the patient or authorized representative who has indicated his/her understanding and acceptance.     Dental advisory given  Plan Discussed with: CRNA and Anesthesiologist  Anesthesia Plan Comments:       Anesthesia Quick Evaluation

## 2021-05-19 ENCOUNTER — Encounter (HOSPITAL_COMMUNITY)
Admission: RE | Disposition: A | Discharge status: Home / Self Care | Payer: Self-pay | Source: Home / Self Care | Attending: Surgery

## 2021-05-19 ENCOUNTER — Encounter (HOSPITAL_COMMUNITY): Payer: Self-pay | Admitting: Surgery

## 2021-05-19 ENCOUNTER — Ambulatory Visit (HOSPITAL_COMMUNITY): Payer: Medicare HMO | Admitting: Anesthesiology

## 2021-05-19 ENCOUNTER — Observation Stay (HOSPITAL_COMMUNITY)
Admission: RE | Admit: 2021-05-19 | Discharge: 2021-05-20 | Disposition: A | Discharge status: Home / Self Care | Payer: Medicare HMO | Attending: Surgery | Admitting: Surgery

## 2021-05-19 ENCOUNTER — Other Ambulatory Visit: Payer: Self-pay

## 2021-05-19 DIAGNOSIS — Z951 Presence of aortocoronary bypass graft: Secondary | ICD-10-CM | POA: Insufficient documentation

## 2021-05-19 DIAGNOSIS — I2510 Atherosclerotic heart disease of native coronary artery without angina pectoris: Secondary | ICD-10-CM | POA: Diagnosis not present

## 2021-05-19 DIAGNOSIS — I10 Essential (primary) hypertension: Secondary | ICD-10-CM | POA: Diagnosis not present

## 2021-05-19 DIAGNOSIS — K430 Incisional hernia with obstruction, without gangrene: Secondary | ICD-10-CM | POA: Diagnosis present

## 2021-05-19 DIAGNOSIS — Z87891 Personal history of nicotine dependence: Secondary | ICD-10-CM | POA: Insufficient documentation

## 2021-05-19 DIAGNOSIS — Z9889 Other specified postprocedural states: Secondary | ICD-10-CM

## 2021-05-19 DIAGNOSIS — Z8719 Personal history of other diseases of the digestive system: Secondary | ICD-10-CM

## 2021-05-19 HISTORY — PX: VENTRAL HERNIA REPAIR: SHX424

## 2021-05-19 HISTORY — PX: LAPAROSCOPIC LYSIS OF ADHESIONS: SHX5905

## 2021-05-19 SURGERY — REPAIR, HERNIA, VENTRAL, LAPAROSCOPIC
Anesthesia: General

## 2021-05-19 MED ORDER — MIDAZOLAM HCL 2 MG/2ML IJ SOLN
INTRAMUSCULAR | Status: AC
  Filled 2021-05-19: qty 2

## 2021-05-19 MED ORDER — DEXAMETHASONE SODIUM PHOSPHATE 10 MG/ML IJ SOLN
INTRAMUSCULAR | Status: DC | PRN
  Administered 2021-05-19: 5 mg via INTRAVENOUS

## 2021-05-19 MED ORDER — CEFAZOLIN SODIUM-DEXTROSE 2-4 GM/100ML-% IV SOLN
INTRAVENOUS | Status: AC
  Filled 2021-05-19: qty 100

## 2021-05-19 MED ORDER — ONDANSETRON 4 MG PO TBDP: 4.0000 mg | ORAL_TABLET | Freq: Four times a day (QID) | ORAL | Status: DC | PRN

## 2021-05-19 MED ORDER — DOCUSATE SODIUM 100 MG PO CAPS
100.0000 mg | ORAL_CAPSULE | Freq: Two times a day (BID) | ORAL | Status: DC
  Administered 2021-05-19 – 2021-05-20 (×2): 100 mg via ORAL
  Filled 2021-05-19 (×2): qty 1

## 2021-05-19 MED ORDER — METOPROLOL SUCCINATE ER 25 MG PO TB24: 12.5000 mg | ORAL_TABLET | Freq: Every day | ORAL | Status: DC

## 2021-05-19 MED ORDER — DIPHENHYDRAMINE HCL 12.5 MG/5ML PO ELIX
12.5000 mg | ORAL_SOLUTION | Freq: Four times a day (QID) | ORAL | Status: DC | PRN
  Filled 2021-05-19: qty 5

## 2021-05-19 MED ORDER — ENSURE PRE-SURGERY PO LIQD: 592.0000 mL | Freq: Once | ORAL | Status: DC

## 2021-05-19 MED ORDER — LIDOCAINE 2% (20 MG/ML) 5 ML SYRINGE
INTRAMUSCULAR | Status: AC
  Filled 2021-05-19: qty 5

## 2021-05-19 MED ORDER — SODIUM CHLORIDE 0.9 % IR SOLN
Status: DC | PRN
  Administered 2021-05-19: 1000 mL

## 2021-05-19 MED ORDER — LEVETIRACETAM 250 MG PO TABS
750.0000 mg | ORAL_TABLET | Freq: Two times a day (BID) | ORAL | Status: DC
  Administered 2021-05-19 – 2021-05-20 (×2): 750 mg via ORAL
  Filled 2021-05-19 (×2): qty 3

## 2021-05-19 MED ORDER — ACETAMINOPHEN 500 MG PO TABS
ORAL_TABLET | ORAL | Status: AC
  Administered 2021-05-19: 1000 mg via ORAL
  Filled 2021-05-19: qty 2

## 2021-05-19 MED ORDER — BUPIVACAINE-EPINEPHRINE (PF) 0.25% -1:200000 IJ SOLN
INTRAMUSCULAR | Status: AC
  Filled 2021-05-19: qty 60

## 2021-05-19 MED ORDER — BUPIVACAINE LIPOSOME 1.3 % IJ SUSP
20.0000 mL | Freq: Once | INTRAMUSCULAR | Status: DC
  Filled 2021-05-19: qty 20

## 2021-05-19 MED ORDER — CHLORHEXIDINE GLUCONATE CLOTH 2 % EX PADS: 6.0000 | MEDICATED_PAD | Freq: Once | CUTANEOUS | Status: DC

## 2021-05-19 MED ORDER — SUGAMMADEX SODIUM 200 MG/2ML IV SOLN
INTRAVENOUS | Status: DC | PRN
  Administered 2021-05-19: 200 mg via INTRAVENOUS

## 2021-05-19 MED ORDER — LACTATED RINGERS IV SOLN: INTRAVENOUS | Status: DC

## 2021-05-19 MED ORDER — DIPHENHYDRAMINE HCL 50 MG/ML IJ SOLN: 12.5000 mg | Freq: Four times a day (QID) | INTRAMUSCULAR | Status: DC | PRN

## 2021-05-19 MED ORDER — SCOPOLAMINE 1 MG/3DAYS TD PT72
1.0000 | MEDICATED_PATCH | TRANSDERMAL | Status: DC
  Administered 2021-05-19: 1.5 mg via TRANSDERMAL
  Filled 2021-05-19: qty 1

## 2021-05-19 MED ORDER — GABAPENTIN 300 MG PO CAPS: 300.0000 mg | ORAL_CAPSULE | Freq: Three times a day (TID) | ORAL | 1 refills | Status: DC | PRN

## 2021-05-19 MED ORDER — CHLORHEXIDINE GLUCONATE 0.12 % MT SOLN: 15.0000 mL | Freq: Once | OROMUCOSAL | Status: AC

## 2021-05-19 MED ORDER — TRAMADOL HCL 50 MG PO TABS
50.0000 mg | ORAL_TABLET | Freq: Four times a day (QID) | ORAL | Status: DC | PRN
Start: 2021-05-19 — End: 2021-05-20
  Administered 2021-05-20: 50 mg via ORAL
  Filled 2021-05-19: qty 1

## 2021-05-19 MED ORDER — ONDANSETRON HCL 4 MG/2ML IJ SOLN
INTRAMUSCULAR | Status: DC | PRN
  Administered 2021-05-19: 4 mg via INTRAVENOUS

## 2021-05-19 MED ORDER — HYDROMORPHONE HCL 1 MG/ML IJ SOLN: 0.5000 mg | INTRAMUSCULAR | Status: DC | PRN

## 2021-05-19 MED ORDER — IBUPROFEN 200 MG PO TABS
600.0000 mg | ORAL_TABLET | Freq: Four times a day (QID) | ORAL | Status: DC | PRN
  Administered 2021-05-20: 600 mg via ORAL
  Filled 2021-05-19: qty 3

## 2021-05-19 MED ORDER — EPHEDRINE 5 MG/ML INJ
INTRAVENOUS | Status: AC
  Filled 2021-05-19: qty 5

## 2021-05-19 MED ORDER — PHENYLEPHRINE HCL-NACL 20-0.9 MG/250ML-% IV SOLN
INTRAVENOUS | Status: DC | PRN
  Administered 2021-05-19: 50 ug/min via INTRAVENOUS

## 2021-05-19 MED ORDER — ACETAMINOPHEN 500 MG PO TABS
1000.0000 mg | ORAL_TABLET | Freq: Four times a day (QID) | ORAL | Status: DC
  Administered 2021-05-19 – 2021-05-20 (×2): 1000 mg via ORAL
  Filled 2021-05-19 (×2): qty 2

## 2021-05-19 MED ORDER — ORAL CARE MOUTH RINSE: 15.0000 mL | Freq: Once | OROMUCOSAL | Status: AC

## 2021-05-19 MED ORDER — ONDANSETRON HCL 4 MG/2ML IJ SOLN: 4.0000 mg | Freq: Once | INTRAMUSCULAR | Status: DC | PRN

## 2021-05-19 MED ORDER — BUPIVACAINE LIPOSOME 1.3 % IJ SUSP
INTRAMUSCULAR | Status: AC
  Filled 2021-05-19: qty 20

## 2021-05-19 MED ORDER — ROCURONIUM BROMIDE 10 MG/ML (PF) SYRINGE
PREFILLED_SYRINGE | INTRAVENOUS | Status: AC
  Filled 2021-05-19: qty 10

## 2021-05-19 MED ORDER — TAMSULOSIN HCL 0.4 MG PO CAPS
0.4000 mg | ORAL_CAPSULE | Freq: Every evening | ORAL | Status: DC
  Administered 2021-05-19: 0.4 mg via ORAL
  Filled 2021-05-19: qty 1

## 2021-05-19 MED ORDER — GABAPENTIN 300 MG PO CAPS
ORAL_CAPSULE | ORAL | Status: AC
  Administered 2021-05-19: 300 mg via ORAL
  Filled 2021-05-19: qty 1

## 2021-05-19 MED ORDER — CEFAZOLIN SODIUM-DEXTROSE 2-4 GM/100ML-% IV SOLN
2.0000 g | INTRAVENOUS | Status: AC
  Administered 2021-05-19: 2 g via INTRAVENOUS

## 2021-05-19 MED ORDER — MIDAZOLAM HCL 2 MG/2ML IJ SOLN
INTRAMUSCULAR | Status: DC | PRN
  Administered 2021-05-19: 2 mg via INTRAVENOUS

## 2021-05-19 MED ORDER — BUPIVACAINE-EPINEPHRINE 0.25% -1:200000 IJ SOLN
INTRAMUSCULAR | Status: DC | PRN
  Administered 2021-05-19: 60 mL

## 2021-05-19 MED ORDER — SIMETHICONE 80 MG PO CHEW
40.0000 mg | CHEWABLE_TABLET | Freq: Four times a day (QID) | ORAL | Status: DC | PRN
  Filled 2021-05-19: qty 1

## 2021-05-19 MED ORDER — ROCURONIUM BROMIDE 10 MG/ML (PF) SYRINGE
PREFILLED_SYRINGE | INTRAVENOUS | Status: DC | PRN
  Administered 2021-05-19 (×2): 10 mg via INTRAVENOUS
  Administered 2021-05-19: 80 mg via INTRAVENOUS
  Administered 2021-05-19: 20 mg via INTRAVENOUS
  Administered 2021-05-19: 10 mg via INTRAVENOUS

## 2021-05-19 MED ORDER — FENTANYL CITRATE (PF) 250 MCG/5ML IJ SOLN
INTRAMUSCULAR | Status: AC
  Filled 2021-05-19: qty 5

## 2021-05-19 MED ORDER — PROPOFOL 10 MG/ML IV BOLUS
INTRAVENOUS | Status: DC | PRN
  Administered 2021-05-19: 150 mg via INTRAVENOUS

## 2021-05-19 MED ORDER — PROPOFOL 500 MG/50ML IV EMUL
INTRAVENOUS | Status: DC | PRN
  Administered 2021-05-19: 25 ug/kg/min via INTRAVENOUS

## 2021-05-19 MED ORDER — ACETAMINOPHEN 500 MG PO TABS
1000.0000 mg | ORAL_TABLET | ORAL | Status: AC
Start: 2021-05-19 — End: 2021-05-19

## 2021-05-19 MED ORDER — ENSURE PRE-SURGERY PO LIQD: 296.0000 mL | Freq: Once | ORAL | Status: DC

## 2021-05-19 MED ORDER — BUPIVACAINE-EPINEPHRINE (PF) 0.25% -1:200000 IJ SOLN
INTRAMUSCULAR | Status: AC
  Filled 2021-05-19: qty 30

## 2021-05-19 MED ORDER — GABAPENTIN 300 MG PO CAPS: 300.0000 mg | ORAL_CAPSULE | ORAL | Status: AC

## 2021-05-19 MED ORDER — BUPIVACAINE LIPOSOME 1.3 % IJ SUSP
INTRAMUSCULAR | Status: DC | PRN
  Administered 2021-05-19: 20 mL

## 2021-05-19 MED ORDER — ACETAMINOPHEN 325 MG PO TABS: 650.0000 mg | ORAL_TABLET | Freq: Four times a day (QID) | ORAL | Status: DC | PRN

## 2021-05-19 MED ORDER — LIDOCAINE 2% (20 MG/ML) 5 ML SYRINGE
INTRAMUSCULAR | Status: DC | PRN
  Administered 2021-05-19: 80 mg via INTRAVENOUS

## 2021-05-19 MED ORDER — PHENYLEPHRINE 40 MCG/ML (10ML) SYRINGE FOR IV PUSH (FOR BLOOD PRESSURE SUPPORT)
PREFILLED_SYRINGE | INTRAVENOUS | Status: AC
  Filled 2021-05-19: qty 10

## 2021-05-19 MED ORDER — SUCCINYLCHOLINE CHLORIDE 200 MG/10ML IV SOSY
PREFILLED_SYRINGE | INTRAVENOUS | Status: AC
  Filled 2021-05-19: qty 10

## 2021-05-19 MED ORDER — PRAVASTATIN SODIUM 40 MG PO TABS
40.0000 mg | ORAL_TABLET | Freq: Every evening | ORAL | Status: DC
  Administered 2021-05-19: 40 mg via ORAL
  Filled 2021-05-19: qty 1

## 2021-05-19 MED ORDER — METHOCARBAMOL 750 MG PO TABS: 750.0000 mg | ORAL_TABLET | Freq: Four times a day (QID) | ORAL | 2 refills | Status: DC | PRN

## 2021-05-19 MED ORDER — ONDANSETRON HCL 4 MG/2ML IJ SOLN: 4.0000 mg | Freq: Four times a day (QID) | INTRAMUSCULAR | Status: DC | PRN

## 2021-05-19 MED ORDER — FENTANYL CITRATE (PF) 250 MCG/5ML IJ SOLN
INTRAMUSCULAR | Status: DC | PRN
  Administered 2021-05-19: 50 ug via INTRAVENOUS
  Administered 2021-05-19: 100 ug via INTRAVENOUS
  Administered 2021-05-19: 50 ug via INTRAVENOUS

## 2021-05-19 MED ORDER — FENTANYL CITRATE (PF) 100 MCG/2ML IJ SOLN: 25.0000 ug | INTRAMUSCULAR | Status: DC | PRN

## 2021-05-19 MED ORDER — ONDANSETRON HCL 4 MG/2ML IJ SOLN
INTRAMUSCULAR | Status: AC
  Filled 2021-05-19: qty 2

## 2021-05-19 MED ORDER — HYDRALAZINE HCL 20 MG/ML IJ SOLN
10.0000 mg | INTRAMUSCULAR | Status: DC | PRN
Start: 2021-05-19 — End: 2021-05-20

## 2021-05-19 MED ORDER — CHLORHEXIDINE GLUCONATE 0.12 % MT SOLN
OROMUCOSAL | Status: AC
  Administered 2021-05-19: 15 mL via OROMUCOSAL
  Filled 2021-05-19: qty 15

## 2021-05-19 MED ORDER — 0.9 % SODIUM CHLORIDE (POUR BTL) OPTIME
TOPICAL | Status: DC | PRN
  Administered 2021-05-19: 1000 mL

## 2021-05-19 SURGICAL SUPPLY — 59 items
APPLIER CLIP LOGIC TI 5 (MISCELLANEOUS) IMPLANT
APPLIER CLIP ROT 10 11.4 M/L (STAPLE)
BAG COUNTER SPONGE SURGICOUNT (BAG) ×2 IMPLANT
BINDER ABDOMINAL 12 ML 46-62 (SOFTGOODS) IMPLANT
BLADE CLIPPER SURG (BLADE) IMPLANT
CANISTER SUCT 3000ML PPV (MISCELLANEOUS) ×2 IMPLANT
CHLORAPREP W/TINT 26 (MISCELLANEOUS) ×2 IMPLANT
CLIP APPLIE ROT 10 11.4 M/L (STAPLE) IMPLANT
CLSR STERI-STRIP ANTIMIC 1/2X4 (GAUZE/BANDAGES/DRESSINGS) ×2 IMPLANT
COVER SURGICAL LIGHT HANDLE (MISCELLANEOUS) ×2 IMPLANT
DEVICE SECURE STRAP 25 ABSORB (INSTRUMENTS) ×2 IMPLANT
DEVICE TROCAR PUNCTURE CLOSURE (ENDOMECHANICALS) ×2 IMPLANT
DRAPE WARM FLUID 44X44 (DRAPES) ×2 IMPLANT
DRSG TEGADERM 2-3/8X2-3/4 SM (GAUZE/BANDAGES/DRESSINGS) ×6 IMPLANT
DRSG TEGADERM 4X4.75 (GAUZE/BANDAGES/DRESSINGS) ×2 IMPLANT
ELECT REM PT RETURN 9FT ADLT (ELECTROSURGICAL) ×2
ELECTRODE REM PT RTRN 9FT ADLT (ELECTROSURGICAL) ×1 IMPLANT
GAUZE 4X4 16PLY ~~LOC~~+RFID DBL (SPONGE) ×4 IMPLANT
GAUZE SPONGE 2X2 8PLY STRL LF (GAUZE/BANDAGES/DRESSINGS) ×1 IMPLANT
GLOVE SURG LTX SZ8 (GLOVE) ×2 IMPLANT
GLOVE SURG UNDER LTX SZ8 (GLOVE) ×2 IMPLANT
GOWN STRL REUS W/ TWL LRG LVL3 (GOWN DISPOSABLE) ×2 IMPLANT
GOWN STRL REUS W/ TWL XL LVL3 (GOWN DISPOSABLE) ×1 IMPLANT
GOWN STRL REUS W/TWL LRG LVL3 (GOWN DISPOSABLE) ×2
GOWN STRL REUS W/TWL XL LVL3 (GOWN DISPOSABLE) ×1
IRRIG SUCT STRYKERFLOW 2 WTIP (MISCELLANEOUS) ×2
IRRIGATION SUCT STRKRFLW 2 WTP (MISCELLANEOUS) ×1 IMPLANT
KIT BASIN OR (CUSTOM PROCEDURE TRAY) ×2 IMPLANT
KIT TURNOVER KIT B (KITS) ×2 IMPLANT
MARKER SKIN DUAL TIP RULER LAB (MISCELLANEOUS) ×2 IMPLANT
MESH VENTRALIGHT ST 8X10 (Mesh General) ×2 IMPLANT
NEEDLE 22X1 1/2 (OR ONLY) (NEEDLE) ×2 IMPLANT
NEEDLE INSUFFLATION 14GA 120MM (NEEDLE) IMPLANT
NEEDLE SPNL 22GX3.5 QUINCKE BK (NEEDLE) ×2 IMPLANT
NS IRRIG 1000ML POUR BTL (IV SOLUTION) ×4 IMPLANT
PAD ARMBOARD 7.5X6 YLW CONV (MISCELLANEOUS) ×4 IMPLANT
SCISSORS LAP 5X35 DISP (ENDOMECHANICALS) ×2 IMPLANT
SET IRRIG TUBING LAPAROSCOPIC (IRRIGATION / IRRIGATOR) ×2 IMPLANT
SET TUBE SMOKE EVAC HIGH FLOW (TUBING) ×2 IMPLANT
SHEARS HARMONIC ACE PLUS 36CM (ENDOMECHANICALS) IMPLANT
SLEEVE ENDOPATH XCEL 5M (ENDOMECHANICALS) IMPLANT
SPONGE GAUZE 2X2 STER 10/PKG (GAUZE/BANDAGES/DRESSINGS) ×1
STRIP CLOSURE SKIN 1/2X4 (GAUZE/BANDAGES/DRESSINGS) ×4 IMPLANT
SUT MNCRL AB 4-0 PS2 18 (SUTURE) ×4 IMPLANT
SUT PDS AB 1 CT  36 (SUTURE) ×3
SUT PDS AB 1 CT 36 (SUTURE) ×3 IMPLANT
SUT PROLENE 1 CT (SUTURE) ×8 IMPLANT
SUT VIC AB 2-0 SH 18 (SUTURE) ×2 IMPLANT
SUT VICRYL 0 UR6 27IN ABS (SUTURE) IMPLANT
TOWEL GREEN STERILE (TOWEL DISPOSABLE) ×2 IMPLANT
TOWEL GREEN STERILE FF (TOWEL DISPOSABLE) ×2 IMPLANT
TRAY FOLEY MTR SLVR 14FR STAT (SET/KITS/TRAYS/PACK) IMPLANT
TRAY FOLEY W/BAG SLVR 16FR (SET/KITS/TRAYS/PACK)
TRAY FOLEY W/BAG SLVR 16FR ST (SET/KITS/TRAYS/PACK) IMPLANT
TRAY LAPAROSCOPIC MC (CUSTOM PROCEDURE TRAY) ×2 IMPLANT
TROCAR ADV FIXATION 5X100MM (TROCAR) ×4 IMPLANT
TROCAR XCEL NON-BLD 11X100MML (ENDOMECHANICALS) IMPLANT
TROCAR XCEL NON-BLD 5MMX100MML (ENDOMECHANICALS) ×4 IMPLANT
WATER STERILE IRR 1000ML POUR (IV SOLUTION) ×2 IMPLANT

## 2021-05-19 NOTE — H&P (Signed)
05/19/2021      PROVIDER: Hollace Kinnier, MD  Patient Care Team: Warnell Bureau, MD as PCP - General (Family Medicine) Prescott Gum, Volney American, MD as Consulting Provider (Cardiothoracic Surgery) Candee Furbish, MD as Cardiologist (Cardiovascular Disease)  [facility] MRN: [id number] DOB: [birth year] DATE OF ENCOUNTER: 04/10/2021  Interval History:   The patient returns to the office after undergoing upper scopic upper abdominal incisional hernia repair with mesh 25 x 20 cm on 10/14/2020  Patient notes he initially felt well but then felt some pulling discomfort for the past few months. He is worried that bulging is come back and the hernia has come back on the left upper corner . He does not smoke. He is nondiabetic. He has been avoid heavy lifting. He is just been doing the treadmill. He still has some soreness along his left costal ridge. He notes he struggled with pain recovery after hernia surgery. However he would only take 2 Tylenol a day at the most. He notes he is struggling with some back and knee pain. He notes he cannot take any narcotics as it confuses him especially oxycodone. Cannot take muscle relaxants. Cannot tolerate gabapentin. He just been taking Tylenol twice a day. He recalls being told by his cardiac surgeon he could take no more than 2 Tylenol a day. I noted that seemed unlikely. No fevers chills or sweats or nausea vomiting.    Labs, Imaging and Diagnostic Testing:  Located in [facility] section of Epic EMR chart  PRIOR NOTES   Erik Salazar Appointment: 11/08/2020 11:45 AM Location: [facility] Patient #: [id number] DOB: 20-Oct-1950 Married / Language: [name] / Race: White Male  History of Present Illness Erik Hector MD; 11/08/2020 11:50 AM) The patient is a 70 year old male who presents for a hernia surgery post-op. Note for "Hernia surgery post-op": ` ` ` The patient returns s/p laparoscopic repair epigastric incisional hernia with  25 x 20 cm dual sided mesh 10/14/2020  The patient returns to clinic after surgery, gradually improving.  He was concerned that I did not try and reach his wife but saw in the OP note that I did leave a message.  He notes he's feeling quite good overall.  Occasionally has some pulling on his rib cage laterally but nothing too severe.  They traveled up [place].  He only took a few narcotic pills.  Works for the soreness.  He is feeling better.  He is back at the gym using exercise machine about 30 minutes a day.  Wondering if it's okay to try more intense activity such as sit up/crunches.  No fevers or chills.  No nausea or vomiting.  Pain from the incisions is fading away.  Denies nausea, constipation/diarrhea, worsening fatigue, high fevers, or other concerns. ` ###########################################  ` ###########################################`  10/14/2020  PATIENT:  Erik Salazar  70 y.o. male  Patient Care Team: Pa, [place] as PCP - General (Family Medicine) Jerline Pain, MD as PCP - Cardiology (Cardiology) Rolene Course, PA-C as Physician Assistant (Internal Medicine) Prescott Gum, Collier Salina, MD (Inactive) as Consulting Physician (Cardiothoracic Surgery) Otis Brace, MD as Consulting Physician (Gastroenterology) Michael Boston, MD as Consulting Physician (General Surgery)  PRE-OPERATIVE DIAGNOSIS:  ventral incisional incarcerated abdominal wall hernia  POST-OPERATIVE DIAGNOSIS:  Ventral incisional incarcerated abdominal wall hernia  PROCEDURE:   LAPAROSCOPIC REPAIR OF  INCISIONAL ABDOMINAL WALL HERNIA WITH MESH TAP BLOCK - BILATERAL  SURGEON:  Erik Hector, MD  ASSISTANT: Nurse  ANESTHESIA:     General  Nerve block provided with liposomal bupivacaine (Experel) mixed with 0.25% bupivacaine as a Bilateral TAP block x 18mL each side at the level of the transverse abdominis & preperitoneal spaces along the flank at the anterior axillary line, from  subcostal ridge to iliac crest under laparoscopic guidance  EBL:  Total I/O In: 1100 [I.V.:1000; IV Piggyback:100] Out: 20 [Blood:20]  Per anesthesia record  Delay start of Pharmacological VTE agent (>24hrs) due to surgical blood loss or risk of bleeding:  no  DRAINS: none  SPECIMEN:  No Specimen  DISPOSITION OF SPECIMEN:  N/A  COUNTS:  YES  PLAN OF CARE: Discharge to home after PACU  PATIENT DISPOSITION:  PACU - hemodynamically stable.  INDICATION: Pleasant patient has developed a ventral wall abdominal hernia.  History of prior cardiac surgery with epigastric incisional hernia and subxiphoid region.  Recommendation was made for surgical repair  The anatomy & physiology of the abdominal wall was discussed. The pathophysiology of hernias was discussed. Natural history risks without surgery including progeressive enlargement, pain, incarceration & strangulation was discussed. Contributors to complications such as smoking, obesity, diabetes, prior surgery, etc were discussed. I feel the risks of no intervention will lead to serious problems that outweigh the operative risks; therefore, I recommended surgery to reduce and repair the hernia. I explained laparoscopic techniques with possible need for an open approach. I noted the probable use of mesh to patch and/or buttress the hernia repair.  Risks such as bleeding, infection, abscess, need for further treatment, heart attack, death, and other risks were discussed. I noted a good likelihood this will help address the problem. Goals of post-operative recovery were discussed as well. Possibility that this will not correct all symptoms was explained. I stressed the importance of low-impact activity, aggressive pain control, avoiding constipation, & not pushing through pain to minimize risk of post-operative chronic pain or injury. Possibility of reherniation especially with smoking, obesity, diabetes, immunosuppression, and other health conditions  was discussed. We will work to minimize complications.  An educational handout further explaining the pathology & treatment options was given as well. Questions were answered. The patient expresses understanding & wishes to proceed with surgery.  OR FINDINGS:   Epigastric incisional hernia 8 x 5 cm region of Swiss cheese hernias.  Omentum and falciform ligament involved.  Type of repair: Laparoscopic underlay repair.               Placement of mesh: Upper third of mesh in the subxiphoid region.  Edges tucked into preperitoneal/retrorectus space.  Name of mesh: Bard Ventralight dual sided (polypropylene / Seprafilm)  Size of mesh: 25x20cm  Orientation: Vertical  Mesh overlap:  5-7cm  DESCRIPTION:  Informed consent was confirmed. The patient underwent general anaesthesia without difficulty. The patient was positioned appropriately. VTE prevention in place. The patient's abdomen was clipped, prepped, & draped in a sterile fashion. Surgical timeout confirmed our plan.  The patient was positioned in reverse Trendelenburg. Abdominal entry was gained using optical entry technique in the left upper abdomen. Entry was clean. I induced carbon dioxide insufflation. Camera inspection revealed no injury. Extra ports were carefully placed under direct laparoscopic visualization.  I could see adhesions on the parietal peritoneum under the abdominal wall.  I freed off the falciform ligament and omentum and central peritoneum to expose the retrorectus fascia   were rather dense adhesions of omentum and falciform ligament to the upper abdomen.  I freed off the middle third of the peritoneum  off midline and carried it cephalad proximal to the xiphoid.  The posterior fascia was quite disruptive involving the hernia.  And released the falciform ligament and preperitoneal fat and adhesions exposed a Swiss cheese region of hernias associated with the subxiphoid region.  There was really no fascia between the costal  edge/xiphoid edge and the hernia itself.  Not feel that there was a good opportunity for primary closure.  Therefore decided to bridge with a large sheet of mesh.  I made sure hemostasis was good.  I mapped out the region using a needle passer.   To ensure that I would have at least 5 cm radial coverage outside of the hernia defect, I chose a 25x20cm dual sided mesh.  I placed #1 Prolene stitches around its lower 1/2 edge about every 5 cm = 8 total.  I rolled the mesh & placed into the peritoneal cavity through a supraumbilical fascia incision.  I unrolled the mesh and positioned it appropriately.  I secured the mesh to cover up the hernia defect using a laparoscopic suture passer to pass the tails of the Prolene through the abdominal wall & tagged them with clamps for good transfascial suturing.  I started out in corners to make sure I had the mesh centered under the hernia defect appropriately, and then proceeded to work in quadrants.  Made sure that it was well centered.  I also placed #1 Prolene sutures in the right and left paramedian subxiphoid regions where there was good fascia.  Also in the supraumbilical region.  This help tacked the mesh centrally and have good mesh approximation around the hernia defect.  We evacuated CO2 & desufflated the abdomen.  I tied the fascial stitches down. I closed the fascial defect that I placed the mesh through using #1 PDS interrupted transverse stitches primarily.  I reinsufflated the abdomen. The mesh provided at least circumferential coverage around the entire region of hernia defects.  I secured the mesh centrally with an additional trans fascial stitch in & out the mesh using #1 prolene under laparoscopic visualization.   I tacked the edges & central part of the mesh to the peritoneum/posterior rectus fascia with SecureStrap absorbable tacks.   I did reinspection. Hemostasis was good. Mesh laid well. I completed a broad field block of local anesthesia at fascial  stitch sites & fascial closure areas.    Capnoperitoneum was evacuated. Ports were removed. The skin was closed with Monocryl at the port sites and Steri-Strips on the fascial stitch puncture sites.  Patient is being extubated to go to the recovery room.  I made an attempt to locate family to discuss patient's status and recommendations.  No one is available at this time.  I was able to get voicemail of his wife, "Bea".  Left instructions and updated her.  Erik Salazar, M.D., F.A.C.S. Gastrointestinal and Minimally Invasive Surgery [facility] [address] [place], [place] [address] [phone] Main / Paging  10/14/2020 2:20 PM  Allergies Adriana Reams [place], CNA; 11/08/2020 11:12 AM) Sulfur *CHEMICALS*   oxyCODONE HCl *ANALGESICS - OPIOID*   OxyCONTIN *ANALGESICS - OPIOID*   Percocet *ANALGESICS - OPIOID*   Allergies Reconciled    Medication History Janeann Forehand, CNA; 11/08/2020 11:12 AM) traMADol HCl  (50MG  Tablet, 1 (one) Oral three times daily, as needed, Taken starting 10/17/2020) Active. Doxycycline Monohydrate  (100MG  Capsule, Oral) Active. Metoprolol Succinate ER  (25MG  Tablet ER 24HR, Oral) Active. Pravastatin Sodium  (40MG  Tablet, Oral) Active.  Aspirin  (81MG  Tablet, Oral) Active. Flomax  (0.4MG  Capsule, Oral) Active. levETIRAcetam  (750MG  Tablet, Oral) Active. Medications Reconciled   Physical Exam Erik Hector MD; 11/08/2020 11:34 AM) General Mental Status - Alert. General Appearance - Not in acute distress. Voice - Normal. Note:  Relaxed.  Nontoxic. Moving around regularly without any guarding.  Integumentary Global Assessment Upon inspection and palpation of skin surfaces of the - Distribution of scalp and body hair is normal. General Characteristics Overall examination of the patient's skin reveals - no rashes and no suspicious lesions.  Head and Neck Head - normocephalic, atraumatic with no lesions or palpable  masses. Face Global Assessment - atraumatic, no absence of expression. Neck Global Assessment - no abnormal movements, no decreased range of motion. Trachea - midline. Thyroid Gland Characteristics - non-tender.  Eye Eyeball - Left - Extraocular movements intact, No Nystagmus - Left. Eyeball - Right - Extraocular movements intact, No Nystagmus - Right. Upper Eyelid - Left - No Cyanotic - Left. Upper Eyelid - Right - No Cyanotic - Right.  Chest and Lung Exam Inspection Accessory muscles - No use of accessory muscles in breathing.  Abdomen Note:  Incisions with normal healing ridges.  Some thinning and laxity in the epigastric region in the setting of diastases but no recurrent hernia. No hematoma or seroma. No guarding. Nondistended. No active bleeding.  No cellulitis.  No guarding/rebound tenderness  Peripheral Vascular Upper Extremity Inspection - Left - Not Gangrenous, No Petechiae. Inspection - Right - Not Gangrenous, No Petechiae.  Neurologic Neurologic evaluation reveals  - normal attention span and ability to concentrate, able to name objects and repeat phrases. Appropriate fund of knowledge and normal coordination.  Neuropsychiatric Mental status exam performed with findings of - able to articulate well with normal speech/language, rate, volume and coherence and no evidence of hallucinations, delusions, obsessions or homicidal/suicidal ideation. Orientation - oriented X3.  Musculoskeletal Global Assessment Gait and Station - normal gait and station.  Lymphatic General Lymphatics Description - No Generalized lymphadenopathy.  Assessment & Plan Erik Hector MD; 11/08/2020 11:50 AM) Fatima Blank HERNIA, WITHOUT OBSTRUCTION OR GANGRENE (K43.2) Impression: Subxiphoid incisional hernia status post lap LOA & incisional hernia repair with mesh.  Considering he is only 4 weeks out from a rather big hernia surgery, he is doing rather well.  Increase activity as tolerated.  Transitioned unrestricted activity around 6 weeks. He may need a little more time but is already doing moderate activity and cut off narcotics rather quickly. He is optimistic as MI.. We will see. He feels much better overall.  Discussion about exercises, etc.  I think at this point he can follow-up as needed. Questions answered. He feels reassured. F/U PRN - S/P HERNIA SURGERY (E95.284) Current Plans Return to clinic as needed.   Soreness, decreased appetite, and poor energy level are common problems after surgery.  While many people can struggle with a bad day, these concerns should gradually fade away or at least improve.  Much of your recovery depends on your health & the severity of your operation.  Please call if you have any further questions / concerns related to surgery.  Increase activity as tolerated to regular everyday activity.  Consider daily low impact exercise every day such as walking an hour a day.   Do not push through pain.  If it hurts to do it, then don't do it.  Diet as tolerated.  Low fat high fiber diet ideal.   30 g fiber a day ideal.  Consider taking a daily fiber supplement to keep your bowels regular.  Followup with your primary care physician for other health issues as would normally be done.   Consider screening for malignancies (breast, prostate, colon, melanoma, etc) as appropriate.  Discuss with you primary care physician.  Pt Education - [facility] Hernia Post-Op HCI (Alvis Edgell): discussed with patient and provided information. DIASTASIS RECTI (M62.08) Impression: Upper abdominal diastases recti making the incisional hernia more likely. Therefore larger mesh repair Current Plans Pt Education - [facility] Diastasis Recti: discussed with patient and provided information.  Signed electronically by Erik Hector, MD (11/08/2020 11:50 AM)  SURGERY NOTES:  10/14/2020   PATIENT: Erik Salazar 70 y.o. male   Patient Care Team: Pa, [place] as PCP -  General (Family Medicine) Jerline Pain, MD as PCP - Cardiology (Cardiology) Rolene Course, PA-C as Physician Assistant (Internal Medicine) Prescott Gum, Collier Salina, MD (Inactive) as Consulting Physician (Cardiothoracic Surgery) Otis Brace, MD as Consulting Physician (Gastroenterology) Michael Boston, MD as Consulting Physician (General Surgery)   PRE-OPERATIVE DIAGNOSIS: ventral incisional incarcerated abdominal wall hernia   POST-OPERATIVE DIAGNOSIS: Ventral incisional incarcerated abdominal wall hernia   PROCEDURE:  LAPAROSCOPIC REPAIR OF INCISIONAL ABDOMINAL WALL HERNIA WITH MESH TAP BLOCK - BILATERAL   SURGEON: Erik Hector, MD   ASSISTANT: Nurse    ANESTHESIA:    General   Nerve block provided with liposomal bupivacaine (Experel) mixed with 0.25% bupivacaine as a Bilateral TAP block x 39mL each side at the level of the transverse abdominis & preperitoneal spaces along the flank at the anterior axillary line, from subcostal ridge to iliac crest under laparoscopic guidance    EBL: Total I/O In: 1100 [I.V.:1000; IV Piggyback:100] Out: 20 [Blood:20] Per anesthesia record   Delay start of Pharmacological VTE agent (>24hrs) due to surgical blood loss or risk of bleeding: no   DRAINS: none    SPECIMEN: No Specimen   DISPOSITION OF SPECIMEN: N/A   COUNTS: YES   PLAN OF CARE: Discharge to home after PACU   PATIENT DISPOSITION: PACU - hemodynamically stable.   INDICATION: Pleasant patient has developed a ventral wall abdominal hernia. History of prior cardiac surgery with epigastric incisional hernia and subxiphoid region. Recommendation was made for surgical repair   The anatomy & physiology of the abdominal wall was discussed. The pathophysiology of hernias was discussed. Natural history risks without surgery including progeressive enlargement, pain, incarceration & strangulation was discussed. Contributors to complications such as smoking, obesity, diabetes, prior  surgery, etc were discussed.  I feel the risks of no intervention will lead to serious problems that outweigh the operative risks; therefore, I recommended surgery to reduce and repair the hernia. I explained laparoscopic techniques with possible need for an open approach. I noted the probable use of mesh to patch and/or buttress the hernia repair.   Risks such as bleeding, infection, abscess, need for further treatment, heart attack, death, and other risks were discussed. I noted a good likelihood this will help address the problem. Goals of post-operative recovery were discussed as well. Possibility that this will not correct all symptoms was explained. I stressed the importance of low-impact activity, aggressive pain control, avoiding constipation, & not pushing through pain to minimize risk of post-operative chronic pain or injury. Possibility of reherniation especially with smoking, obesity, diabetes, immunosuppression, and other health conditions was discussed. We will work to minimize complications.    An educational handout further explaining the pathology & treatment options was given as well. Questions were  answered. The patient expresses understanding & wishes to proceed with surgery.    OR FINDINGS: Epigastric incisional hernia 8 x 5 cm region of Swiss cheese hernias. Omentum and falciform ligament involved.   Type of repair: Laparoscopic underlay repair.   Placement of mesh: Upper third of mesh in the subxiphoid region. Edges tucked into preperitoneal/retrorectus space.   Name of mesh: Bard Ventralight dual sided (polypropylene / Seprafilm)   Size of mesh: 25x20cm   Orientation: Vertical   Mesh overlap: 5-7cm     DESCRIPTION:    Informed consent was confirmed. The patient underwent general anaesthesia without difficulty. The patient was positioned appropriately. VTE prevention in place. The patient's abdomen was clipped, prepped, & draped in a sterile fashion. Surgical timeout  confirmed our plan. The patient was positioned in reverse Trendelenburg. Abdominal entry was gained using optical entry technique in the left upper abdomen. Entry was clean. I induced carbon dioxide insufflation. Camera inspection revealed no injury. Extra ports were carefully placed under direct laparoscopic visualization.    I could see adhesions on the parietal peritoneum under the abdominal wall. I freed off the falciform ligament and omentum and central peritoneum to expose the retrorectus fascia were rather dense adhesions of omentum and falciform ligament to the upper abdomen. I freed off the middle third of the peritoneum off midline and carried it cephalad proximal to the xiphoid. The posterior fascia was quite disruptive involving the hernia. And released the falciform ligament and preperitoneal fat and adhesions exposed a Swiss cheese region of hernias associated with the subxiphoid region. There was really no fascia between the costal edge/xiphoid edge and the hernia itself. Not feel that there was a good opportunity for primary closure. Therefore decided to bridge with a large sheet of mesh. I made sure hemostasis was good. I mapped out the region using a needle passer. To ensure that I would have at least 5 cm radial coverage outside of the hernia defect, I chose a 25x20cm dual sided mesh. I placed #1 Prolene stitches around its lower 1/2 edge about every 5 cm = 8 total. I rolled the mesh & placed into the peritoneal cavity through a supraumbilical fascia incision. I unrolled the mesh and positioned it appropriately.   I secured the mesh to cover up the hernia defect using a laparoscopic suture passer to pass the tails of the Prolene through the abdominal wall & tagged them with clamps for good transfascial suturing. I started out in corners to make sure I had the mesh centered under the hernia defect appropriately, and then proceeded to work in quadrants. Made sure that it was well centered. I  also placed #1 Prolene sutures in the right and left paramedian subxiphoid regions where there was good fascia. Also in the supraumbilical region. This help tacked the mesh centrally and have good mesh approximation around the hernia defect.   We evacuated CO2 & desufflated the abdomen. I tied the fascial stitches down. I closed the fascial defect that I placed the mesh through using #1 PDS interrupted transverse stitches primarily. I reinsufflated the abdomen. The mesh provided at least circumferential coverage around the entire region of hernia defects. I secured the mesh centrally with an additional trans fascial stitch in & out the mesh using #1 prolene under laparoscopic visualization. I tacked the edges & central part of the mesh to the peritoneum/posterior rectus fascia with SecureStrap absorbable tacks. I did reinspection. Hemostasis was good. Mesh laid well. I completed a broad field block of local  anesthesia at fascial stitch sites & fascial closure areas.    Capnoperitoneum was evacuated. Ports were removed. The skin was closed with Monocryl at the port sites and Steri-Strips on the fascial stitch puncture sites. Patient is being extubated to go to the recovery room. I made an attempt to locate family to discuss patient's status and recommendations. No one is available at this time. I was able to get voicemail of his wife, "Bea". Left instructions and updated her.      Erik Salazar, M.D., F.A.C.S. Gastrointestinal and Minimally Invasive Surgery [facility] [address], [place], [place] [address] [phone] Main / Paging   10/14/2020 2:20 PM    PATHOLOGY:  Not applicable  Physical Examination:   Constitutional: Not cachectic. Hygeine adequate. Vitals signs as above.  Eyes: Normal extraocular movements. Sclera nonicteric Neuro: No major focal sensory defects. No major motor deficits. Psych: No severe agitation. No severe anxiety. Judgment &  insight Adequate, Oriented x4, HENT: Normocephalic, Mucus membranes moist. No thrush.  Neck: Supple, No tracheal deviation. No obvious thyromegaly Chest: No pain to chest wall compression. Good respiratory excursion. No audible wheezing CV: Regular rhythm. No major extremity edema  Abdomen: Obese Hernia: Left subxiphoid/subcostal bulging suspicious for hernia recurrence. Sensitive. Discomfort along the left subcostal region no hernia also. Diastasis recti: Large supraumbilical midline. Soft. Nondistended. Nontender.   Gen: Inguinal hernia: Not present. Inguinal lymph nodes: without lymphadenopathy.   Rectal: (Deferred)  Ext: No obvious deformity or contracture. Edema: Not present. No cyanosis Skin: Warm and dry Musculoskeletal: Severe joint rigidity not present.   Assessment and Plan:   Erik Salazar is a 70 y.o. male status post laparoscopic repair of upper abdominal epigastric incisional hernias 10/14/2020 with possible recurrence along the left subxiphoid costal ridge.  There are no diagnoses linked to this encounter.   It seems atypical since I noted third of the mesh should be above the costal ridge but he does have bulging there. Is suspicious for hernia recurrence. Disappointing.  I recommended pain control. He tells me he can only take 2 Tylenol a day. Cannot tolerate narcotics, muscle relaxants, gabapentin.  For diagnostic laparoscopy and repair of any recurrent hernia. Try and make sure to have adequate overlap. It may be just eventration of the mesh in itself but it is rather sensitive and atypical. He would like to be aggressive & consider it, but he also wants to wait until November if possible. Not having nausea and vomiting and he is still active. He seems to live with pain and will only take a couple Tylenol at the most.  Will double check with his cardiologist on his claim that he can take more than 2 Tylenol a day. Patient wants cardiac clearance again. Was cleared by  Dr. Marlou Porch. We will reach out to him just to double check. He continues to use the treadmill and is active.  Another issue is he has back pain and knee pain and is thinking about needing surgery for those as well. I will defer to when he wants to triage his numerous issues.  No follow-ups on file.  The plan was discussed in detail with the patient today, who expressed understanding & appreciation. The patient has my contact information, and understands to call me with any additional questions or concerns in the interval. I would be happy to see the patient back sooner if the need arises.   Erik Hector, MD, FACS, MASCRS Esophageal, Gastrointestinal & Colorectal Surgery  Robotic and Minimally Invasive Surgery  [facility], [place]. [address] [place], [place] [address] [phone] Fax [phone] Main  CONTACT INFORMATION:  Weekday (9AM-5PM): Call [facility] main office at [phone]  Weeknight (5PM-9AM) or Weekend/Holiday: Check [email or web address] (password " [email or web address]") for General Surgery [facility] coverage  (Please, do not use SecureChat as it is not reliable communication to operating surgeons for immediate patient care)

## 2021-05-19 NOTE — Anesthesia Procedure Notes (Signed)
Procedure Name: Intubation Date/Time: 05/19/2021 4:56 PM Performed by: Reece Agar, CRNA Pre-anesthesia Checklist: Patient identified, Emergency Drugs available, Suction available and Patient being monitored Patient Re-evaluated:Patient Re-evaluated prior to induction Oxygen Delivery Method: Circle System Utilized Preoxygenation: Pre-oxygenation with 100% oxygen Induction Type: IV induction Ventilation: Mask ventilation without difficulty Laryngoscope Size: Glidescope and 4 Grade View: Grade I Tube type: Oral Tube size: 7.5 mm Number of attempts: 1 Airway Equipment and Method: Stylet Placement Confirmation: ETT inserted through vocal cords under direct vision, positive ETCO2 and breath sounds checked- equal and bilateral Secured at: 22 cm Tube secured with: Tape Dental Injury: Teeth and Oropharynx as per pre-operative assessment  Difficulty Due To: Difficulty was anticipated and Difficult Airway- due to reduced neck mobility

## 2021-05-19 NOTE — Op Note (Signed)
05/19/2021  PATIENT:  Erik Salazar  70 y.o. male  Patient Care Team: Orpah Melter, MD as PCP - General (Family Medicine) Jerline Pain, MD as PCP - Cardiology (Cardiology) Rolene Course, PA-C as Physician Assistant (Internal Medicine) Prescott Gum, Collier Salina, MD (Inactive) as Consulting Physician (Cardiothoracic Surgery) Otis Brace, MD as Consulting Physician (Gastroenterology) Michael Boston, MD as Consulting Physician (General Surgery) Leandrew Koyanagi, MD as Attending Physician (Orthopedic Surgery)  PRE-OPERATIVE DIAGNOSIS:  VENTRAL INCISIONAL RECURRENT ABDOMINAL WALL HERNIAE  POST-OPERATIVE DIAGNOSIS:  VENTRAL INCISIONAL RECURRENT ABDOMINAL WALL HERNIAE with eventration  PROCEDURE:   LAPAROSCOPIC REPAIR OF  INCISIONAL RECURRENT ABDOMINAL WALL HERNIA WITH MESH  TAP BLOCK - BILATERAL  SURGEON:  [name], MD  ASSISTANT: Nurse   ANESTHESIA:     General  Regional TRANSVERSUS ABDOMINIS PLANE (TAP) nerve block for perioperative & postoperative pain control provided with liposomal bupivacaine (Experel) mixed with 0.25% bupivacaine as a Bilateral TAP block x 70mL each side at the level of the transverse abdominis & preperitoneal spaces along the flank at the anterior axillary line, fcostal ridge & subxiphoid region to iliac crest under laparoscopic guidance   EBL:  Total I/O In: -  Out: 100 [Urine:50; Blood:50]  Per anesthesia record  Delay start of Pharmacological VTE agent (>24hrs) due to surgical blood loss or risk of bleeding:  no  DRAINS: none   SPECIMEN:  No Specimen  DISPOSITION OF SPECIMEN:  N/A  COUNTS:  YES  PLAN OF CARE: Discharge to home after PACU  PATIENT DISPOSITION:  PACU - hemodynamically stable.  INDICATION: Pleasant patient has developed a ventral wall abdominal hernia.  History of prior cardiac surgery requiring repeat operation and laparotomy.  Developed epigastric incisional hernia.  Did laparoscopic repair of mesh.  He returned with  recurrent swelling in the subxiphoid region.  Suspicious for recurrent hernia/mesh retraction.  I offered diagnostic laparoscopy and possible repair of recurrent hernia  The anatomy & physiology of the abdominal wall was discussed. The pathophysiology of hernias was discussed. Natural history risks without surgery including progeressive enlargement, pain, incarceration & strangulation was discussed. Contributors to complications such as smoking, obesity, diabetes, prior surgery, etc were discussed.  I feel the risks of no intervention will lead to serious problems that outweigh the operative risks; therefore, I recommended surgery to reduce and repair the hernia. I explained laparoscopic techniques with possible need for an open approach. I noted the probable use of mesh to patch and/or buttress the hernia repair.  Risks such as bleeding, infection, abscess, need for further treatment, heart attack, death, and other risks were discussed. I noted a good likelihood this will help address the problem. Goals of post-operative recovery were discussed as well. Possibility that this will not correct all symptoms was explained. I stressed the importance of low-impact activity, aggressive pain control, avoiding constipation, & not pushing through pain to minimize risk of post-operative chronic pain or injury. Possibility of reherniation especially with smoking, obesity, diabetes, immunosuppression, and other health conditions was discussed. We will work to minimize complications.   An educational handout further explaining the pathology & treatment options was given as well. Questions were answered. The patient expresses understanding & wishes to proceed with surgery.   OR FINDINGS: Very dense adhesions of bowel liver and stomach to anterior abdominal wall and old mesh.  Loop of small bowel densely adherent in periumbilical region at site of older periumbilical mesh  Type of repair: Laparoscopic underlay repair.   Primary repair of largest hernia  Placement of mesh: Intraperitoneal underlay repair with two thirds of the mesh superior to the costal ridge and xiphoid  Name of mesh: Bard Ventralight dual sided (polypropylene / Seprafilm)  Size of mesh: 25x20cm  Orientation: Transverse  Mesh overlap:  5-7cm   DESCRIPTION:   Informed consent was confirmed. The patient underwent general anaesthesia without difficulty. The patient was positioned appropriately. VTE prevention in place. The patient's abdomen was clipped, prepped, & draped in a sterile fashion. Surgical timeout confirmed our plan.  The patient was positioned in reverse Trendelenburg. Abdominal entry was gained using optical entry technique in the left upper abdomen. Entry was clean. I induced carbon dioxide insufflation. Camera inspection revealed no injury. Extra ports were carefully placed under direct laparoscopic visualization.   I could see adhesions on the parietal peritoneum under the abdominal wall.  I did laparoscopic lysis of adhesions to expose the entire anterior abdominal wall.  I primarily used focused sharp dissection.  I freed off the falciform ligament and central peritoneum to expose the retrorectus fascia   I freed omentum off the anterior abdominal wall.  I could see some omentum going up to the left diaphragm consistent with prior omental pedicle flap.  Was quite thinned out.  Mobilized it down.  I freed the liver off the subcostal region and lower diaphragm up to the dome on both sides.  Inspecting the old mesh was intact supraumbilically.  It appeared to be going above the subcostal ridge and some areas but appeared to had even treated just below the xiphoid where his area of bulging had been.  I made sure hemostasis was good.    I mapped out the region using a needle passer.   To ensure that I would have at least 5 cm radial coverage outside of the hernia defect, I chose a 25x20cm dual sided mesh.  I placed #1 Prolene  stitches around its inferior edge about every 5 cm = 4 total.  I rolled the mesh & placed into the peritoneal cavity through the  hernia defect.  I unrolled the mesh and positioned it appropriately.  I secured the mesh to cover up the hernia defect using a laparoscopic suture passer to pass the tails of the Prolene through the abdominal wall & tagged them with clamps for good transfascial suturing.  I started out at the inferior central edge of the mesh.  This overlapped with old mesh.  I then rolled the rest of the mesh superior to the costal ridge underneath the xiphoid and ribs and diaphragm.  I used a tacker to tack the mesh along the ribs and underneath the xiphoid as well.  Try to minimize tacks to the diaphragm.  Superior edge of the mesh went up to the dome of the diaphragm.  I used #1 Prolene sutures in and out the mesh using a laparoscopic suture passer along the right and left subcostal ridge as well as in the high subxiphoid region really where the ribs came to the sternum at the superior corners of the xiphoid into the peritoneal cavity.  6 inches total, 2 right subcostal, 2.  Xiphoid, to left subcostal.  Then did 2 more stitches in the upper paramedian region as well.  This helped breach of the even treated subxiphoid hernia region.  We evacuated CO2 & desufflated the abdomen.  I tied the fascial stitches down. I closed the fascial defect that I placed the mesh through using #1 Prolene suture laparoscopically x2 to help centrally tacked the mesh  as well.   I reinsufflated the abdomen. The mesh provided at least circumferential coverage around the entire region of hernia defects.  Hemostasis was good. Mesh laid well. I completed a broad field block of local anesthesia at fascial stitch sites & fascial closure areas.    Capnoperitoneum was evacuated. Ports were removed.  I excised the subxiphoid left paramedian vertical incision scar in a vertical bioconcave fashion.  Closed that wound with  interrupted 2-0 Vicryl suture running 4-0 Monocryl suture.  That provided a more flat taut culture with no more eventration or swelling.  The skin was closed with Monocryl and Steri-Strips on the fascial stitch puncture sites.  Patient is being extubated to go to the recovery room.  I left a message noting the operative findings, updated the patient's status, discussed probable steps to recovery, and gave postoperative recommendations to the patient's spouse, Bea, on her phone via voicemail.    [name], M.D., F.A.C.S. Gastrointestinal and Minimally Invasive Surgery [facility] [address] [place], [place] [address] [phone] Main / Paging  05/19/2021 7:56 PM

## 2021-05-19 NOTE — Interval H&P Note (Signed)
History and Physical Interval Note:  05/19/2021 4:11 PM  Erik Salazar  has presented today for surgery, with the diagnosis of [place].  The various methods of treatment have been discussed with the patient and family. After consideration of risks, benefits and other options for treatment, the patient has consented to  Procedure(s) with comments: [place] (N/A) LYSIS OF ADHESIONS (N/A) - GEN & LOCAL as a surgical intervention.  The patient's history has been reviewed, patient examined, no change in status, stable for surgery.  I have reviewed the patient's chart and labs.  Questions were answered to the patient's satisfaction.    I have re-reviewed the the patient's records, history, medications, and allergies.  I have re-examined the patient.  I again discussed intraoperative plans and goals of post-operative recovery.  The patient agrees to proceed.  Erik Salazar  May 16, 1951 [id number]  Patient Care Team: Orpah Melter, MD as PCP - General (Family Medicine) Jerline Pain, MD as PCP - Cardiology (Cardiology) Rolene Course, PA-C as Physician Assistant (Internal Medicine) Prescott Gum, Collier Salina, MD (Inactive) as Consulting Physician (Cardiothoracic Surgery) Otis Brace, MD as Consulting Physician (Gastroenterology) Michael Boston, MD as Consulting Physician (General Surgery) Leandrew Koyanagi, MD as Attending Physician (Orthopedic Surgery)  Patient Active Problem List   Diagnosis Date Noted   Benign prostatic hyperplasia without lower urinary tract symptoms 10/14/2020   Chronic pain 10/14/2020   ED (erectile dysfunction) of organic origin 10/14/2020   Esophageal dysphagia 10/14/2020   Gastroesophageal reflux disease 10/14/2020   History of aortic valve replacement with bioprosthetic valve 10/14/2020   Hx of aortic aneurysm repair 10/14/2020   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   Thoracic aortic aneurysm  without rupture ([place]) 10/14/2020   Recurrent incisional hernia 10/14/2020   Spinal stenosis in cervical region 05/04/2020   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   Cervical radiculopathy at C6 11/14/2017   Carpal tunnel syndrome on right 07/08/2017   Tremor, essential 07/08/2017   Ischemic stroke of frontal lobe ([place]) 04/05/2017   Pain in right hand 04/05/2017   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 03/27/2017   Seizure ([place]) 03/14/2017   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/13/2017   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   Essential hypertension 02/26/2017   Mixed hyperlipidemia 02/26/2017    Past Medical History:  Diagnosis Date   Carpal tunnel syndrome on right 07/08/2017   Cervical radiculopathy at C6 7/85/8850   Right   Complication of anesthesia    Coronary artery disease    Enlarged prostate    H/O: knee surgery    15    History of open heart surgery    ASCENDING AORTIC ANEURYSM   Ischemic stroke of frontal lobe ([place]) 03/14/2017   Bilateral; post-redo CT surgery   PONV (postoperative nausea and vomiting)    only after CABG surgeries   Seizure disorder ([place]) 03/14/2017   Seizures ([place])    Status post knee surgery    DVT POST KNEE SURGERY    Past Surgical History:  Procedure Laterality Date   ANTERIOR CERVICAL DECOMP/DISCECTOMY FUSION N/A 05/04/2020   Procedure: Anterior Cervical Decompression/Discectomy Fuion Cervical three-four, Cervical four-five, Cervical five-six;  Surgeon: Kary Kos, MD;  Location: [facility];  Service: Neurosurgery;  Laterality: N/A;   BALLOON DILATION N/A 06/23/2019   Procedure: BALLOON DILATION;  Surgeon: Otis Brace, MD;  Location: [facility];  Service: Gastroenterology;  Laterality: N/A;   BENTALL PROCEDURE  01/04/2016   Bentall with 23  mm pericardial AVR; SVG-LAD, SVG-CX ([place])   BIOPSY  06/23/2019   Procedure: BIOPSY;  Surgeon: Otis Brace, MD;  Location: [facility];  Service: Gastroenterology;;    CARDIAC SURGERY     ANUERSYM MAY 2017   Bentall procedure. Bioprosthetic aortic valve #23 mm bovine model #2700 TF ask, and 28 mm Gelweave woven vascular sinus of Valsalva graft   CARPAL TUNNEL RELEASE  08/2018   right hand    CORONARY ARTERY BYPASS GRAFT  01/04/2016   VG to LAD & VG to LCX   CORONARY ARTERY BYPASS GRAFT  03/13/2017   LIMA to LAD with steril abcess removal from dacron graft   ESOPHAGOGASTRODUODENOSCOPY (EGD) WITH PROPOFOL N/A 06/23/2019   Procedure: ESOPHAGOGASTRODUODENOSCOPY (EGD) WITH PROPOFOL;  Surgeon: Otis Brace, MD;  [facility]: [facility];  Service: Gastroenterology;  Laterality: N/A;   FALSE ANEURYSM REPAIR  03/13/2017   redo sternotomy, sterile abscess removal from Dacron graft, omental flap around aorta, CABG: LIMA-LAD ([name], Dr. Mart Piggs)   [place]  2018   Of pericardium 2018   INSERTION OF MESH  10/14/2020   Procedure: INSERTION OF MESH;  Surgeon: Michael Boston, MD;  Location: [facility];  Service: General;;   knee surgeries     13 surgeries on knee done before knee replacement    [place] N/A 10/14/2020   Procedure: LYSIS OF ADHESION;  Surgeon: Michael Boston, MD;  Location: [facility];  Service: General;  Laterality: N/A;   open heart surgery     03-13-2017   REPLACEMENT TOTAL KNEE  2015   ROTATOR CUFF REPAIR     TONSILLECTOMY     removed as a child.   VENTRAL HERNIA REPAIR N/A 10/14/2020   Procedure: LAPAROSCOPIC VENTRAL HERNIA REPAIR WITH TAP BLOCK BILATERAL;  Surgeon: Michael Boston, MD;  Location: [facility];  Service: General;  Laterality: N/A;    Social History   Socioeconomic History   Marital status: Married    Spouse name: Not on file   Number of children: Not on file   Years of education: Not on file   Highest education level: Not on file  Occupational History   Occupation: RETIRED  Tobacco Use   Smoking status: Former    Types: Cigars   Smokeless tobacco: Never  Vaping Use   Vaping Use:  Never used  Substance and Sexual Activity   Alcohol use: Yes    Alcohol/week: 1.0 standard drink    Types: 1 Cans of beer per week    Comment: occassionally   Drug use: No   Sexual activity: Not on file  Other Topics Concern   Not on file  Social History Narrative   Lives at home with wife, is retired.  Education: 2 yrs college.   2 Children.   Social Determinants of Health   Financial Resource Strain: Not on file  Food Insecurity: Not on file  Transportation Needs: Not on file  Physical Activity: Not on file  Stress: Not on file  Social Connections: Not on file  Intimate Partner Violence: Not on file    Family History  Problem Relation Age of Onset   Aneurysm Mother        brain aneurysm for mother.     Medications Prior to Admission  Medication Sig Dispense Refill Last Dose   acetaminophen (TYLENOL) 325 MG tablet Take 650 mg by mouth every 6 (six) hours as needed for moderate pain or headache.  05/18/2021   ascorbic acid (VITAMIN C) 500 MG tablet Take 500 mg by mouth daily.   Past Month   aspirin EC 81 MG tablet Take 81 mg by mouth daily. Swallow whole.   Past Month   Cholecalciferol (VITAMIN D3) 50 MCG (2000 UT) TABS Take 2,000 Units by mouth daily.   Past Month   levETIRAcetam (KEPPRA) 750 MG tablet TAKE ONE TABLET BY MOUTH TWICE A DAY (Patient taking differently: Take 750 mg by mouth 2 (two) times daily.) 180 tablet 1 05/19/2021 at 0500   metoprolol succinate (TOPROL-XL) 25 MG 24 hr tablet Take 0.5 tablets (12.5 mg total) by mouth daily. With or immediately following a meal. 45 tablet 3 05/18/2021   pravastatin (PRAVACHOL) 40 MG tablet Take 1 tablet (40 mg total) by mouth every evening. 90 tablet 3 05/18/2021   pyridOXINE (VITAMIN B-6) 100 MG tablet Take 100 mg by mouth daily.   Past Month   tamsulosin (FLOMAX) 0.4 MG CAPS capsule Take 0.4 mg by mouth every evening.    05/18/2021   COVID-19 mRNA bivalent vaccine, Pfizer, injection Inject into the muscle. 0.3 mL 0 Unknown     Current Facility-Administered Medications  Medication Dose Route Frequency Provider Last Rate Last Admin   bupivacaine liposome (EXPAREL) 1.3 % injection 266 mg  20 mL Infiltration Once Michael Boston, MD       ceFAZolin (ANCEF) 2-4 GM/100ML-% IVPB            ceFAZolin (ANCEF) IVPB 2g/100 mL premix  2 g Intravenous On Call to OR Michael Boston, MD       Chlorhexidine Gluconate Cloth 2 % PADS 6 each  6 each Topical Once Michael Boston, MD       And   Chlorhexidine Gluconate Cloth 2 % PADS 6 each  6 each Topical Once Michael Boston, MD       Derrill Memo ON 05/20/2021] feeding supplement (ENSURE PRE-SURGERY) liquid 296 mL  296 mL Oral Once Michael Boston, MD       feeding supplement (ENSURE PRE-SURGERY) liquid 592 mL  592 mL Oral Once Michael Boston, MD       lactated ringers infusion   Intravenous Continuous Audry Pili, MD 10 mL/hr at 05/19/21 1148 New Bag at 05/19/21 1148   scopolamine (TRANSDERM-SCOP) 1 MG/3DAYS 1.5 mg  1 patch Transdermal Kathee Delton, MD   1.5 mg at 05/19/21 1331     Allergies  Allergen Reactions   Oxycodone Hcl Other (See Comments)    DELUSIONAL   Zetia [Ezetimibe] Other (See Comments)    Leg cramps   Augmentin [Amoxicillin-Pot Clavulanate] Nausea Only    Dizzy   Elemental Sulfur Rash    BP (!) 170/49   Pulse 87   Temp 98.3 F (36.8 C) (Oral)   Resp 20   Ht 5\' 10"  (1.778 m)   Wt 89.4 kg   BMI 28.27 kg/m   Labs: No results found for this or any previous visit (from the past 48 hour(s)).  Imaging / Studies: No results found.   [name], M.D., F.A.C.S. Gastrointestinal and Minimally Invasive Surgery [facility] [address] [place], [place] [address] [phone] Main / Paging  05/19/2021 4:12 PM    [name]

## 2021-05-19 NOTE — Transfer of Care (Signed)
Immediate Anesthesia Transfer of Care Note  Patient: Erik Salazar  Procedure(s) Performed: LAPAROSCOPIC VENTRAL WALL HERNIA REPAIR LYSIS OF ADHESIONS  Patient Location: PACU  Anesthesia Type:General  Level of Consciousness: drowsy  Airway & Oxygen Therapy: Patient Spontanous Breathing and Patient connected to face mask oxygen  Post-op Assessment: Report given to RN and Post -op Vital signs reviewed and stable  Post vital signs: Reviewed and stable  Last Vitals:  Vitals Value Taken Time  BP 142/64 05/19/21 2015  Temp 36.7 C 05/19/21 2015  Pulse 74 05/19/21 2018  Resp 16 05/19/21 2018  SpO2 98 % 05/19/21 2018  Vitals shown include unvalidated device data.  Last Pain:  Vitals:   05/19/21 1122  TempSrc:   PainSc: 7       Patients Stated Pain Goal: 3 (22/30/09 7949)  Complications: No notable events documented.

## 2021-05-19 NOTE — Discharge Instructions (Signed)
HERNIA REPAIR: POST OP INSTRUCTIONS  ######################################################################  EAT Gradually transition to a high fiber diet with a fiber supplement over the next few weeks after discharge.  Start with a pureed / full liquid diet (see below)  WALK Walk an hour a day.  Control your pain to do that.    CONTROL PAIN Control pain so that you can walk, sleep, tolerate sneezing/coughing, and go up/down stairs.  HAVE A BOWEL MOVEMENT DAILY Keep your bowels regular to avoid problems.  OK to try a laxative to override constipation.  OK to use an antidairrheal to slow down diarrhea.  Call if not better after 2 tries  CALL IF YOU HAVE PROBLEMS/CONCERNS Call if you are still struggling despite following these instructions. Call if you have concerns not answered by these instructions  ######################################################################    DIET: Follow a light bland diet & liquids the first 24 hours after arrival home, such as soup, liquids, starches, etc.  Be sure to drink plenty of fluids.  Quickly advance to a usual solid diet within a few days.  Avoid fast food or heavy meals as your are more likely to get nauseated or have irregular bowels.  A low-fat, high-fiber diet for the rest of your life is ideal.   Take your usually prescribed home medications unless otherwise directed.  PAIN CONTROL: Pain is best controlled by a usual combination of three different methods TOGETHER: Ice/Heat Over the counter pain medication Prescription pain medication Most patients will experience some swelling and bruising around the hernia(s) such as the bellybutton, groins, or old incisions.  Ice packs or heating pads (30-60 minutes up to 6 times a day) will help. Use ice for the first few days to help decrease swelling and bruising, then switch to heat to help relax tight/sore spots and speed recovery.  Some people prefer to use ice alone, heat alone, alternating  between ice & heat.  Experiment to what works for you.  Swelling and bruising can take several weeks to resolve.   It is helpful to take an over-the-counter pain medication regularly for the first few weeks.  Choose one of the following that works best for you: Naproxen (Aleve, etc)  Two 220mg tabs twice a day Ibuprofen (Advil, etc) Three 200mg tabs four times a day (every meal & bedtime) Acetaminophen (Tylenol, etc) 325-650mg four times a day (every meal & bedtime) A  prescription for pain medication should be given to you upon discharge.  Take your pain medication as prescribed.  If you are having problems/concerns with the prescription medicine (does not control pain, nausea, vomiting, rash, itching, etc), please call us [phone] to see if we need to switch you to a different pain medicine that will work better for you and/or control your side effect better. If you need a refill on your pain medication, please contact your pharmacy.  They will contact our office to request authorization. Prescriptions will not be filled after 5 pm or on week-ends.  Avoid getting constipated.  Between the surgery and the pain medications, it is common to experience some constipation.  Increasing fluid intake and taking a fiber supplement (such as Metamucil, Citrucel, FiberCon, MiraLax, etc) 1-2 times a day regularly will usually help prevent this problem from occurring.  A mild laxative (prune juice, Milk of Magnesia, MiraLax, etc) should be taken according to package directions if there are no bowel movements after 48 hours.    Wash / shower every day.  You may shower over the dressings   as they are waterproof.    Remove your waterproof bandages, skin tapes, and other bandages 3 days after surgery. You may replace a dressing/Band-Aid to cover the incision for comfort if you wish. You may leave the incisions open to air.  You may replace a dressing/Band-Aid to cover an incision for comfort if you wish.  Continue  to shower over incision(s) after the dressing is off.  ACTIVITIES as tolerated:   You may resume regular (light) daily activities beginning the next day--such as daily self-care, walking, climbing stairs--gradually increasing activities as tolerated.  Control your pain so that you can walk an hour a day.  If you can walk 30 minutes without difficulty, it is safe to try more intense activity such as jogging, treadmill, bicycling, low-impact aerobics, swimming, etc. Save the most intensive and strenuous activity for last such as sit-ups, heavy lifting, contact sports, etc  Refrain from any heavy lifting or straining until you are off narcotics for pain control.   DO NOT PUSH THROUGH PAIN.  Let pain be your guide: If it hurts to do something, don't do it.  Pain is your body warning you to avoid that activity for another week until the pain goes down. You may drive when you are no longer taking prescription pain medication, you can comfortably wear a seatbelt, and you can safely maneuver your car and apply brakes. You may have sexual intercourse when it is comfortable.   FOLLOW UP in our office Please call [facility] at [phone] to set up an appointment to see your surgeon in the office for a follow-up appointment approximately 2-3 weeks after your surgery. Make sure that you call for this appointment the day you arrive home to insure a convenient appointment time.  9.  If you have disability of FMLA / Family leave forms, please bring the forms to the office for processing.  (do not give to your surgeon).  WHEN TO CALL US [phone]: Poor pain control Reactions / problems with new medications (rash/itching, nausea, etc)  Fever over 101.5 F (38.5 C) Inability to urinate Nausea and/or vomiting Worsening swelling or bruising Continued bleeding from incision. Increased pain, redness, or drainage from the incision   The clinic staff is available to answer your questions during regular business  hours (8:30am-5pm).  Please don't hesitate to call and ask to speak to one of our nurses for clinical concerns.   If you have a medical emergency, go to the nearest emergency room or call 911.  A surgeon from [facility] is always on call at the hospitals in [place]  [facility] [address], [address], [place], [place]  [address] ?  [address] [address], [place], [place]   [address] MAIN: [phone] ? TOLL FREE: [phone] ? FAX: [phone] [email or web address]  

## 2021-05-20 DIAGNOSIS — K430 Incisional hernia with obstruction, without gangrene: Secondary | ICD-10-CM | POA: Diagnosis not present

## 2021-05-20 NOTE — Progress Notes (Signed)
Patient was transported via wheelchair by RN for discharge to home; with no acute distress nor complaints of pain nor discomfort; incisions on his abdomen with transparent dressings and steri strips were all clean, dry and intact; room was checked and accounted for all his belongings; discharge instructions concerning his medications, wound care, follow-up appointment and when to call the doctor as needed were all discussed with the patient and his wife by RN and both verbalized understanding on the instructions given.

## 2021-05-20 NOTE — Discharge Summary (Signed)
[facility] Discharge Summary   Patient ID: Erik Salazar MRN: [id number] DOB/AGE: September 19, 1950 70 y.o.  Admit date: 05/19/2021 Discharge date: 05/20/2021  Admitting Diagnosis: [place] HERNIAE  Discharge Diagnosis Patient Active Problem List   Diagnosis Date Noted   S/P repair of ventral hernia 05/19/2021   S/P repair of recurrent ventral hernia 05/19/2021   Benign prostatic hyperplasia without lower urinary tract symptoms 10/14/2020   Chronic pain 10/14/2020   ED (erectile dysfunction) of organic origin 10/14/2020   Esophageal dysphagia 10/14/2020   Gastroesophageal reflux disease 10/14/2020   History of aortic valve replacement with bioprosthetic valve 10/14/2020   Hx of aortic aneurysm repair 10/14/2020   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   Thoracic aortic aneurysm without rupture 10/14/2020   Recurrent incisional hernia 10/14/2020   Spinal stenosis in cervical region 05/04/2020   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   Cervical radiculopathy at C6 11/14/2017   Carpal tunnel syndrome on right 07/08/2017   Tremor, essential 07/08/2017   Ischemic stroke of frontal lobe ([place]) 04/05/2017   Pain in right hand 04/05/2017   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  Procedures Dr. Johney Maine (05/19/2021) - LAPAROSCOPIC REPAIR OF  INCISIONAL RECURRENT ABDOMINAL WALL HERNIA WITH MESH; TAP BLOCK - BILATERAL  Hospital Course:  Erik Salazar is a 70yo male with recurrent ventral incisional abdominal wall hernia who was admitted to [facility] 05/19/21 for laparoscopic repair. Tolerated procedure well and was transferred to the floor.  Diet was advanced as tolerated.  On POD1, the patient was  voiding well, tolerating diet, ambulating well, pain well controlled, vital signs stable, incisions c/d/i and felt stable for discharge home.  Patient will follow up as below and knows to call with questions or concerns.     Physical Exam: General:  Alert, NAD, pleasant, comfortable Pulm: rate and effort normal Abd:  Soft, ND, NT, +BS, incisions with cdi dressings and steri strips in place without drainage or surrounding erythema  Allergies as of 05/20/2021       Reactions   Oxycodone Hcl Other (See Comments)   DELUSIONAL   Zetia [ezetimibe] Other (See Comments)   Leg cramps   Augmentin [amoxicillin-pot Clavulanate] Nausea Only   Dizzy   Elemental Sulfur Rash        Medication List     TAKE these medications    acetaminophen 325 MG tablet Commonly known as: TYLENOL Take 650 mg by mouth every 6 (six) hours as needed for moderate pain or headache.   ascorbic acid 500 MG tablet Commonly known as: VITAMIN C Take 500 mg by mouth daily.   aspirin EC 81 MG tablet Take 81 mg by mouth daily. Swallow whole.   gabapentin 300 MG capsule Commonly known as: NEURONTIN Take 1 capsule (300 mg total) by mouth every 8 (eight) hours as needed. Increase to 4x/day as needed   levETIRAcetam 750 MG tablet Commonly known as: KEPPRA TAKE ONE TABLET BY MOUTH TWICE A DAY   methocarbamol 750 MG tablet Commonly known as: ROBAXIN Take 1 tablet (750 mg total) by mouth 4 (four) times daily as needed (use for muscle cramps/pain).   metoprolol succinate 25 MG 24 hr tablet Commonly known as: TOPROL-XL Take 0.5 tablets (12.5 mg total) by mouth daily.  With or immediately following a meal.   Pfizer COVID-19 Vac Bivalent injection Generic drug: COVID-19 mRNA bivalent vaccine (Pfizer) Inject into the muscle.   pravastatin 40 MG tablet Commonly known as: PRAVACHOL Take 1 tablet (40 mg total) by mouth every evening.   pyridOXINE 100 MG tablet Commonly known as: VITAMIN B-6 Take 100 mg by mouth  daily.   tamsulosin 0.4 MG Caps capsule Commonly known as: FLOMAX Take 0.4 mg by mouth every evening.   Vitamin D3 50 MCG (2000 UT) Tabs Take 2,000 Units by mouth daily.               Discharge Care Instructions  (From admission, onward)           Start     Ordered   05/19/21 0000  Discharge wound care:       Comments: It is good for closed incisions and even open wounds to be washed every day.  Shower every day.  Short baths are fine.  Wash the incisions and wounds clean with soap & water.    You may leave closed incisions open to air if it is dry.   You may cover the incision with clean gauze & replace it after your daily shower for comfort.  TEGADERM & STERISTRIPS:  You have clear gauze band-aid dressings over your closed incision(s).  You also have skin tapes called Steristrips on your incisions.  Leave these in place.  You may trim any edges that curl up with clean scissors.  You may remove all dressings & tapes in the shower in a week.   05/19/21 1652              Follow-up Information     Michael Boston, MD Follow up in 3 week(s).   Specialties: General Surgery, Colon and Rectal Surgery Why: To follow up after your operation Contact information: [place] [address] [phone]                 Signed: Wellington Hampshire, [facility] 05/20/2021, 8:12 AM Please see Amion for pager number during day hours 7:00am-4:30pm

## 2021-05-20 NOTE — Plan of Care (Signed)

## 2021-05-21 NOTE — Anesthesia Postprocedure Evaluation (Signed)
Anesthesia Post Note  Patient: Erik Salazar  Procedure(s) Performed: LAPAROSCOPIC VENTRAL WALL HERNIA REPAIR LYSIS OF ADHESIONS     Patient location during evaluation: PACU Anesthesia Type: General Level of consciousness: sedated and patient cooperative Pain management: pain level controlled Vital Signs Assessment: post-procedure vital signs reviewed and stable Respiratory status: spontaneous breathing Cardiovascular status: stable Anesthetic complications: no   No notable events documented.  Last Vitals:  Vitals:   05/20/21 0324 05/20/21 0728  BP: (!) 107/55 (!) 123/55  Pulse: 96 (!) 101  Resp: 20 18  Temp: 36.7 C 36.6 C  SpO2: 93% 96%    Last Pain:  Vitals:   05/20/21 0728  TempSrc: Oral  PainSc: [place]

## 2021-05-22 ENCOUNTER — Encounter (HOSPITAL_COMMUNITY): Payer: Self-pay | Admitting: Surgery

## 2021-05-22 ENCOUNTER — Other Ambulatory Visit: Payer: Self-pay | Admitting: Neurosurgery

## 2021-06-06 ENCOUNTER — Telehealth: Payer: Self-pay | Admitting: *Deleted

## 2021-06-06 NOTE — Telephone Encounter (Signed)
   Pre-operative Risk Assessment    Patient Name: Erik Salazar  DOB: 08/15/1951 MRN: [id number]      Request for Surgical Clearance   Procedure:   L5-S1 POSTERIOR LUMBAR INTERBODY FUSION  Date of Surgery: Clearance 06/28/21                                 Surgeon:  DR. Kary Kos Surgeon's Group or Practice Name:  [facility] Phone number:  [phone] Fax number:  [phone] ATTN: JESSICA   Type of Clearance Requested: - Medical  ASA    Type of Anesthesia:   General    Additional requests/questions:   Jiles Prows   06/06/2021, 9:41 AM

## 2021-06-06 NOTE — Telephone Encounter (Signed)
   Name: Erik Salazar  DOB: 1951-08-16  MRN: [id number]   Primary Cardiologist: Candee Furbish, MD  Chart reviewed as part of pre-operative protocol coverage. Patient was contacted 06/06/2021 in reference to pre-operative risk assessment for pending surgery as outlined below.  Teandre Coach was last seen on 11/14/2020 by Dr. Marlou Porch.  Since that day, Gerry Bennett has done well without exertional chest pain or worsening dyspnea.   Therefore, based on ACC/AHA guidelines, the patient would be at acceptable risk for the planned procedure without further cardiovascular testing.   Patient has been instructed to hold aspirin for 7 days prior to the procedure and restart as soon as possible afterward at the surgeon's discretion.  The patient was advised that if he develops new symptoms prior to surgery to contact our office to arrange for a follow-up visit, and he verbalized understanding.  I will route this recommendation to the requesting party via Epic fax function and remove from pre-op pool. Please call with questions.  [place], [place] 06/06/2021, 9:49 AM

## 2021-06-14 NOTE — Patient Instructions (Signed)
Below is our plan:  We will continue levetiracetam 750mg  twice daily.   Please make sure you are staying well hydrated. I recommend 50-60 ounces daily. Well balanced diet and regular exercise encouraged. Consistent sleep schedule with 6-8 hours recommended.   Please continue follow up with care team as directed.   Follow up with Erik Salazar in 1 year   You may receive a survey regarding today's visit. I encourage you to leave honest feed back as I do use this information to improve patient care. Thank you for seeing me today!

## 2021-06-14 NOTE — Progress Notes (Signed)
Chief Complaint  Patient presents with   Follow-up    Rm 10, alone. Here for 3 month SZ and tremor f/u. Pt reports doing well. No sz like activity. Pt reports leaving to the gym and having to leave due to not feeling right. When returning home pt felt freezing cold and was shaking uncontrollably for an hour. Since then pt has been fine.      HISTORY OF PRESENT ILLNESS:  06/19/21 ALL:  Erik Salazar is a 70 y.o. male here today for follow up for history of stroke, seizures and essential tremor. He continues levetiracetam 750mg  BID. Last seizure 02/2019 after weaning levetiracetam. He was seen by Judson Roch in 02/2021 for acute concerns of shaking and not feeling right. MRI confirmed old findings but no acute concerns. Labs were unremarkable. Since, he reports feeling normally. No further episodes. He is doing well. No seizures. He is tolerating medication well. He is scheduled for L5-S1 PLIF with Dr Saintclair Halsted 06/28/2021.   03/07/21 SS: Erik Salazar is a 70 year old male with history of bifrontal strokes, seizures, and essential tremor.  Has had cervical spine surgery with Dr. Saintclair Halsted back in September 2021.  On Keppra, helps tremor also. Had incisional hernia repair back in March. Claims needs back surgery, right knee surgery. Has been going through pain with these issues. Here today to discuss acute issue, on Saturday, watching TV, having painful day with chronic issues, then felt "not right", feeling off balance, then started shaking all over, arms and legs bilaterally, he was cold, put blankets on, his wife came in, tried to calm him down. For 1 hour. Stopped on its own. Rest of day felt weak, tired, left eye isn't clear vision, both hands are more numb than baseline. He claims this is how he felt after he woke up from heart surgery and had a stroke. Tremor in both hands, left more than right hand at baseline. Denies anxiety. Exercises daily. Hands feel cold. Claims balance is off more than normal. Hasn't  missed any Keppra. Here today alone. On aspirin 81 mg daily. Denies fever, no cough, has been on antibiotics Augmentin for 8 days, is getting better, had been running at the gym up until Saturday.    HISTORY  07/12/2020 Dr. [name]: Erik Salazar is a 70 year old right-handed white male with a history of bifrontal strokes and seizures.  The patient also has an essential tremor.  He has had troubles with right hand numbness associated with a C6 radiculopathy.  He underwent surgery through Dr. Saintclair Halsted on 04 May 2020.  He is recovering from the surgery that was a 3 level procedure at the C3-4 and C4-5 and C5-6 levels.  The patient still has some right hand numbness but has no significant discomfort.  The patient remains on Keppra taking 750 mg twice daily, his last seizure event was in July 2020 when he tried to come off of the [place].  The patient indicates that the [place] helps his tremor, if he takes his medication late, he notes that the tremor will worsen.  The patient is being evaluated for possible hiatal hernia surgery in December 2021.  Overall he is doing well at this time.   REVIEW OF SYSTEMS: Out of a complete 14 system review of symptoms, the patient complains only of the following symptoms, tremor, back pain and all other reviewed systems are negative.   ALLERGIES: Allergies  Allergen Reactions   Oxycodone Hcl Other (See Comments)    DELUSIONAL   Zetia [Ezetimibe]  Other (See Comments)    Leg cramps   Augmentin [Amoxicillin-Pot Clavulanate] Nausea Only    Dizzy   Elemental Sulfur Rash     HOME MEDICATIONS: Outpatient Medications Prior to Visit  Medication Sig Dispense Refill   ascorbic acid (VITAMIN C) 500 MG tablet Take 500 mg by mouth daily.     aspirin EC 81 MG tablet Take 81 mg by mouth daily. Swallow whole.     Cholecalciferol (VITAMIN D3) 50 MCG (2000 UT) TABS Take 2,000 Units by mouth daily.     metoprolol succinate (TOPROL-XL) 25 MG 24 hr tablet Take 0.5 tablets (12.5 mg  total) by mouth daily. With or immediately following a meal. 45 tablet 3   pravastatin (PRAVACHOL) 40 MG tablet Take 1 tablet (40 mg total) by mouth every evening. 90 tablet 3   pyridOXINE (VITAMIN B-6) 100 MG tablet Take 100 mg by mouth daily.     tamsulosin (FLOMAX) 0.4 MG CAPS capsule Take 0.4 mg by mouth every evening.      acetaminophen (TYLENOL) 325 MG tablet Take 650 mg by mouth every 6 (six) hours as needed for moderate pain or headache.     levETIRAcetam (KEPPRA) 750 MG tablet TAKE ONE TABLET BY MOUTH TWICE A DAY (Patient taking differently: Take 750 mg by mouth 2 (two) times daily.) 180 tablet 1   COVID-19 mRNA bivalent vaccine, Pfizer, injection Inject into the muscle. 0.3 mL 0   gabapentin (NEURONTIN) 300 MG capsule Take 1 capsule (300 mg total) by mouth every 8 (eight) hours as needed. Increase to 4x/day as needed 40 capsule 1   methocarbamol (ROBAXIN) 750 MG tablet Take 1 tablet (750 mg total) by mouth 4 (four) times daily as needed (use for muscle cramps/pain). 30 tablet 2   No facility-administered medications prior to visit.     PAST MEDICAL HISTORY: Past Medical History:  Diagnosis Date   Carpal tunnel syndrome on right 07/08/2017   Cervical radiculopathy at C6 6/65/9935   Right   Complication of anesthesia    Coronary artery disease    Enlarged prostate    H/O: knee surgery    15    History of open heart surgery    ASCENDING AORTIC ANEURYSM   Ischemic stroke of frontal lobe ([place]) 03/14/2017   Bilateral; post-redo CT surgery   PONV (postoperative nausea and vomiting)    only after CABG surgeries   Seizure disorder ([place]) 03/14/2017   Seizures ([place])    Status post knee surgery    DVT POST KNEE SURGERY     PAST SURGICAL HISTORY: Past Surgical History:  Procedure Laterality Date   ANTERIOR CERVICAL DECOMP/DISCECTOMY FUSION N/A 05/04/2020   Procedure: Anterior Cervical Decompression/Discectomy Fuion Cervical three-four, Cervical four-five, Cervical five-six;   Surgeon: Kary Kos, MD;  Location: [facility];  Service: Neurosurgery;  Laterality: N/A;   BALLOON DILATION N/A 06/23/2019   Procedure: BALLOON DILATION;  Surgeon: Otis Brace, MD;  Location: [facility];  Service: Gastroenterology;  Laterality: N/A;   BENTALL PROCEDURE  01/04/2016   Bentall with 23 mm pericardial AVR; SVG-LAD, SVG-CX ([place])   BIOPSY  06/23/2019   Procedure: BIOPSY;  Surgeon: Otis Brace, MD;  Location: [facility];  Service: Gastroenterology;;   CARDIAC SURGERY     ANUERSYM MAY 2017   Bentall procedure. Bioprosthetic aortic valve #23 mm bovine model #2700 TF ask, and 28 mm Gelweave woven vascular sinus of Valsalva graft   CARPAL TUNNEL RELEASE  08/2018   right hand  CORONARY ARTERY BYPASS GRAFT  01/04/2016   VG to LAD & VG to LCX   CORONARY ARTERY BYPASS GRAFT  03/13/2017   LIMA to LAD with steril abcess removal from dacron graft   ESOPHAGOGASTRODUODENOSCOPY (EGD) WITH PROPOFOL N/A 06/23/2019   Procedure: ESOPHAGOGASTRODUODENOSCOPY (EGD) WITH PROPOFOL;  Surgeon: Otis Brace, MD;  [facility];  Service: Gastroenterology;  Laterality: N/A;   FALSE ANEURYSM REPAIR  03/13/2017   redo sternotomy, sterile abscess removal from Dacron graft, omental flap around aorta, CABG: LIMA-LAD ([name], Dr. Mart Piggs)   [place]  2018   Of pericardium 2018   INSERTION OF MESH  10/14/2020   Procedure: INSERTION OF MESH;  Surgeon: Michael Boston, MD;  Location: [facility];  Service: General;;   knee surgeries     13 surgeries on knee done before knee replacement    [place] N/A 05/19/2021   Procedure: LYSIS OF ADHESIONS;  Surgeon: Michael Boston, MD;  Location: [facility];  Service: General;  Laterality: N/A;  GEN & LOCAL   LYSIS OF ADHESION N/A 10/14/2020   Procedure: LYSIS OF ADHESION;  Surgeon: Michael Boston, MD;  Location: [facility];  Service: General;  Laterality: N/A;   open heart surgery      03-13-2017   REPLACEMENT TOTAL KNEE  2015   ROTATOR CUFF REPAIR     TONSILLECTOMY     removed as a child.   VENTRAL HERNIA REPAIR N/A 10/14/2020   Procedure: LAPAROSCOPIC VENTRAL HERNIA REPAIR WITH TAP BLOCK BILATERAL;  Surgeon: Michael Boston, MD;  Location: [facility];  Service: General;  Laterality: N/A;   VENTRAL HERNIA REPAIR N/A 05/19/2021   Procedure: LAPAROSCOPIC VENTRAL WALL HERNIA REPAIR;  Surgeon: Michael Boston, MD;  Location: [name];  Service: General;  Laterality: N/A;     FAMILY HISTORY: Family History  Problem Relation Age of Onset   Aneurysm Mother        brain aneurysm for mother.      SOCIAL HISTORY: Social History   Socioeconomic History   Marital status: Married    Spouse name: Not on file   Number of children: Not on file   Years of education: Not on file   Highest education level: Not on file  Occupational History   Occupation: RETIRED  Tobacco Use   Smoking status: Former    Types: Cigars   Smokeless tobacco: Never  Vaping Use   Vaping Use: Never used  Substance and Sexual Activity   Alcohol use: Yes    Alcohol/week: 1.0 standard drink    Types: 1 Cans of beer per week    Comment: occassionally   Drug use: No   Sexual activity: Not on file  Other Topics Concern   Not on file  Social History Narrative   Lives at home with wife, is retired.  Education: 2 yrs college.   2 Children.   Social Determinants of Health   Financial Resource Strain: Not on file  Food Insecurity: Not on file  Transportation Needs: Not on file  Physical Activity: Not on file  Stress: Not on file  Social Connections: Not on file  Intimate Partner Violence: Not on file     PHYSICAL EXAM  Vitals:   06/19/21 0714  BP: 130/77  Pulse: 83  Weight: 200 lb 8 oz (90.9 kg)  Height: 5\' 10"  (1.778 m)   Body mass index is 28.77 kg/m.  Generalized: Well developed, in no acute distress  Cardiology: normal rate and rhythm, no murmur auscultated  Respiratory: clear to  auscultation bilaterally    Neurological examination  Mentation: Alert oriented to time, place, history taking. Follows all commands speech and language fluent Cranial nerve II-XII: Pupils were equal round reactive to light. Extraocular movements were full, visual field were full on confrontational test. Facial sensation and strength were normal. Head turning and shoulder shrug  were normal and symmetric. Motor: The motor testing reveals 5 over 5 strength of all 4 extremities. Good symmetric motor tone is noted throughout.  Gait and station: Gait is normal.    DIAGNOSTIC DATA (LABS, IMAGING, TESTING) - I reviewed patient records, labs, notes, testing and imaging myself where available.  Lab Results  Component Value Date   WBC 10.0 05/16/2021   HGB 15.5 05/16/2021   HCT 48.6 05/16/2021   MCV 86.0 05/16/2021   PLT 258 05/16/2021      Component Value Date/Time   NA 132 (L) 05/16/2021 0822   NA 135 03/07/2021 0823   K 4.6 05/16/2021 0822   CL 98 05/16/2021 0822   CO2 27 05/16/2021 0822   GLUCOSE 102 (H) 05/16/2021 0822   BUN 15 05/16/2021 0822   BUN 13 03/07/2021 0823   CREATININE 0.99 05/16/2021 0822   CALCIUM 9.3 05/16/2021 0822   PROT 7.0 03/07/2021 0823   ALBUMIN 4.2 03/07/2021 0823   AST 16 03/07/2021 0823   ALT 14 03/07/2021 0823   ALKPHOS 102 03/07/2021 0823   BILITOT 0.5 03/07/2021 0823   GFRNONAA >60 05/16/2021 0822   GFRAA >60 05/02/2020 0909   Lab Results  Component Value Date   CHOL 200 (H) 11/06/2019   HDL 47 11/06/2019   LDLCALC 122 (H) 11/06/2019   TRIG 177 (H) 11/06/2019   CHOLHDL 4.3 11/06/2019   No results found for: HGBA1C No results found for: VITAMINB12 Lab Results  Component Value Date   TSH 1.060 12/13/2016    No flowsheet data found.   No flowsheet data found.   ASSESSMENT AND PLAN  70 y.o. year old male  has a past medical history of Carpal tunnel syndrome on right (07/08/2017), Cervical radiculopathy at C6 (0/25/4270), Complication  of anesthesia, Coronary artery disease, Enlarged prostate, H/O: knee surgery, History of open heart surgery, Ischemic stroke of frontal lobe ([place]) (03/14/2017), PONV (postoperative nausea and vomiting), Seizure disorder ([place]) (03/14/2017), Seizures ([place]), and Status post knee surgery. here with    Seizure ([place])  Ischemic stroke of frontal lobe ([facility])  Tremor, essential  Elijio is doing well. We iwll continue levetiracetam 750mg  BID. He is aware to take medication daily including day of surgery. He will continue healthy lifestyle habits. He will continue pravastatin and asa per PCP for stroke prevention. Follow up with Judson Roch in 1 year. He verbalizes understanding and agreement with this plan.   No orders of the defined types were placed in this encounter.    Meds ordered this encounter  Medications   levETIRAcetam (KEPPRA) 750 MG tablet    Sig: Take 1 tablet (750 mg total) by mouth 2 (two) times daily.    Dispense:  180 tablet    Refill:  3    Order Specific Question:   Supervising Provider    Answer:   Melvenia Beam [[id number]]     Debbora Presto, MSN, FNP-C 06/19/2021, 7:40 AM  [facility] [address] [place], [place] [address] [phone]

## 2021-06-19 ENCOUNTER — Ambulatory Visit (INDEPENDENT_AMBULATORY_CARE_PROVIDER_SITE_OTHER): Payer: Medicare HMO | Admitting: Family Medicine

## 2021-06-19 ENCOUNTER — Encounter: Payer: Self-pay | Admitting: Family Medicine

## 2021-06-19 ENCOUNTER — Other Ambulatory Visit: Payer: Self-pay

## 2021-06-19 VITALS — BP 130/77 | HR 83 | Ht 70.0 in | Wt 200.5 lb

## 2021-06-19 DIAGNOSIS — G250 Essential tremor: Secondary | ICD-10-CM

## 2021-06-19 DIAGNOSIS — R569 Unspecified convulsions: Secondary | ICD-10-CM | POA: Diagnosis not present

## 2021-06-19 DIAGNOSIS — I639 Cerebral infarction, unspecified: Secondary | ICD-10-CM

## 2021-06-19 MED ORDER — LEVETIRACETAM 750 MG PO TABS: 750.0000 mg | ORAL_TABLET | Freq: Two times a day (BID) | ORAL | 3 refills | Status: DC

## 2021-06-23 NOTE — Progress Notes (Addendum)
Surgical Instructions    Your procedure is scheduled on Wednesday, Novemebr 9th, 2022.   Report to [facility] Main Entrance "A" at 06:30 A.M., then check in with the Admitting office.  Call this number if you have problems the morning of surgery:  [phone]   If you have any questions prior to your surgery date call [phone]: Open Monday-Friday 8am-4pm    Remember:  Do not eat or drink after midnight the night before your surgery     Take these medicines the morning of surgery with A SIP OF WATER:  levETIRAcetam (KEPPRA)    Hold aspirin for 7 days prior to the procedure and restart as soon as possible afterward at the surgeon's discretion, per cardiologist.  As of today, STOP taking any Aspirin (unless otherwise instructed by your surgeon) Aleve, Naproxen, Ibuprofen, Motrin, Advil, Goody's, BC's, all herbal medications, fish oil, and all vitamins.    After your COVID test   You are not required to quarantine however you are required to wear a well-fitting mask when you are out and around people not in your household.  If your mask becomes wet or soiled, replace with a new one.  Wash your hands often with soap and water for 20 seconds or clean your hands with an alcohol-based hand sanitizer that contains at least 60% alcohol.  Do not share personal items.  Notify your provider: if you are in close contact with someone who has COVID  or if you develop a fever of 100.4 or greater, sneezing, cough, sore throat, shortness of breath or body aches.    The day of surgery:          Do not wear jewelry  Do not wear lotions, powders, colognes, or deodorant. Men may shave face and neck. Do not bring valuables to the hospital.              [facility] is not responsible for any belongings or valuables.  Do NOT Smoke (Tobacco/Vaping)  24 hours prior to your procedure  If you use a CPAP at night, you may bring your mask for your overnight stay.   Contacts, glasses,  hearing aids, dentures or partials may not be worn into surgery, please bring cases for these belongings   For patients admitted to the hospital, discharge time will be determined by your treatment team.   Patients discharged the day of surgery will not be allowed to drive home, and someone needs to stay with them for 24 hours.  NO VISITORS WILL BE ALLOWED IN PRE-OP WHERE PATIENTS ARE PREPPED FOR SURGERY.  ONLY 1 SUPPORT PERSON MAY BE PRESENT IN THE WAITING ROOM WHILE YOU ARE IN SURGERY.  IF YOU ARE TO BE ADMITTED, ONCE YOU ARE IN YOUR ROOM YOU WILL BE ALLOWED TWO (2) VISITORS. 1 (ONE) VISITOR MAY STAY OVERNIGHT BUT MUST ARRIVE TO THE ROOM BY 8pm.  Minor children may have two parents present. Special consideration for safety and communication needs will be reviewed on a case by case basis.  Special instructions:    Oral Hygiene is also important to reduce your risk of infection.  Remember - BRUSH YOUR TEETH THE MORNING OF SURGERY WITH YOUR REGULAR TOOTHPASTE   - Preparing For Surgery  Before surgery, you can play an important role. Because skin is not sterile, your skin needs to be as free of germs as possible. You can reduce the number of germs on your skin by washing with CHG (chlorahexidine gluconate) Soap before surgery.  CHG is an antiseptic cleaner which kills germs and bonds with the skin to continue killing germs even after washing.     Please do not use if you have an allergy to CHG or antibacterial soaps. If your skin becomes reddened/irritated stop using the CHG.  Do not shave (including legs and underarms) for at least 48 hours prior to first CHG shower. It is OK to shave your face.  Please follow these instructions carefully.     Shower the NIGHT BEFORE SURGERY and the MORNING OF SURGERY with CHG Soap.   If you chose to wash your hair, wash your hair first as usual with your normal shampoo. After you shampoo, rinse your hair and body thoroughly to remove the shampoo.   Then [employer] and genitals (private parts) with your normal soap and rinse thoroughly to remove soap.  After that Use CHG Soap as you would any other liquid soap. You can apply CHG directly to the skin and wash gently with a scrungie or a clean washcloth.   Apply the CHG Soap to your body ONLY FROM THE NECK DOWN.  Do not use on open wounds or open sores. Avoid contact with your eyes, ears, mouth and genitals (private parts). Wash Face and genitals (private parts)  with your normal soap.   Wash thoroughly, paying special attention to the area where your surgery will be performed.  Thoroughly rinse your body with warm water from the neck down.  DO NOT shower/wash with your normal soap after using and rinsing off the CHG Soap.  Pat yourself dry with a CLEAN TOWEL.  Wear CLEAN PAJAMAS to bed the night before surgery  Place CLEAN SHEETS on your bed the night before your surgery  DO NOT SLEEP WITH PETS.   Day of Surgery:  Take a shower with CHG soap. Wear Clean/Comfortable clothing the morning of surgery Do not apply any deodorants/lotions.   Remember to brush your teeth WITH YOUR REGULAR TOOTHPASTE.   Please read over the following fact sheets that you were given.

## 2021-06-26 ENCOUNTER — Encounter (HOSPITAL_COMMUNITY)
Admission: RE | Admit: 2021-06-26 | Discharge: 2021-06-26 | Disposition: A | Discharge status: Home / Self Care | Payer: Medicare HMO | Source: Ambulatory Visit | Attending: Neurosurgery | Admitting: Neurosurgery

## 2021-06-26 ENCOUNTER — Other Ambulatory Visit: Payer: Self-pay

## 2021-06-26 ENCOUNTER — Encounter (HOSPITAL_COMMUNITY): Payer: Self-pay

## 2021-06-26 VITALS — BP 146/81 | HR 95 | Temp 97.9°F | Resp 18 | Ht 70.0 in | Wt 198.9 lb

## 2021-06-26 DIAGNOSIS — Z01818 Encounter for other preprocedural examination: Secondary | ICD-10-CM

## 2021-06-26 DIAGNOSIS — E782 Mixed hyperlipidemia: Secondary | ICD-10-CM

## 2021-06-26 DIAGNOSIS — Z01812 Encounter for preprocedural laboratory examination: Secondary | ICD-10-CM | POA: Insufficient documentation

## 2021-06-26 DIAGNOSIS — Z20822 Contact with and (suspected) exposure to covid-19: Secondary | ICD-10-CM | POA: Insufficient documentation

## 2021-06-26 DIAGNOSIS — I10 Essential (primary) hypertension: Secondary | ICD-10-CM | POA: Diagnosis not present

## 2021-06-26 LAB — CBC
HCT: 47.9 % (ref 39.0–52.0)
Hemoglobin: 15.3 g/dL (ref 13.0–17.0)
MCH: 27.1 pg (ref 26.0–34.0)
MCHC: 31.9 g/dL (ref 30.0–36.0)
MCV: 84.8 fL (ref 80.0–100.0)
Platelets: 247 10*3/uL (ref 150–400)
RBC: 5.65 MIL/uL (ref 4.22–5.81)
RDW: 13.6 % (ref 11.5–15.5)
WBC: 10.2 10*3/uL (ref 4.0–10.5)
nRBC: 0 % (ref 0.0–0.2)

## 2021-06-26 LAB — BASIC METABOLIC PANEL
Anion gap: 10 (ref 5–15)
BUN: 14 mg/dL (ref 8–23)
CO2: 21 mmol/L — ABNORMAL LOW (ref 22–32)
Calcium: 9.3 mg/dL (ref 8.9–10.3)
Chloride: 100 mmol/L (ref 98–111)
Creatinine, Ser: 0.99 mg/dL (ref 0.61–1.24)
GFR, Estimated: >60 mL/min (ref >60)
Glucose, Bld: 100 mg/dL — ABNORMAL HIGH (ref 70–99)
Potassium: 4.9 mmol/L (ref 3.5–5.1)
Sodium: 131 mmol/L — ABNORMAL LOW (ref 135–145)

## 2021-06-26 LAB — SURGICAL PCR SCREEN
MRSA, PCR: NEGATIVE
Staphylococcus aureus: NEGATIVE

## 2021-06-26 LAB — SARS CORONAVIRUS 2 (TAT 6-24 HRS): SARS Coronavirus 2: NEGATIVE

## 2021-06-26 LAB — TYPE AND SCREEN
ABO/RH(D): O POS
Antibody Screen: NEGATIVE

## 2021-06-26 NOTE — Progress Notes (Signed)
PCP - Dr. Christella Noa Cardiologist - Dr. Candee Furbish [facility]- [facility]  PPM/ICD - N/A  Chest x-ray - n/a EKG - 10/06/20 Stress Test - pt reports one, unsure of time frame, reported normal. ECHO - 12/08/20 Cardiac Cath - 03/12/17  Sleep Study - denies CPAP - denies  Blood Thinner Instructions: n/a Aspirin Instructions: Hold 7 days prior to surgery. LD reported 06/16/21  NPO at midnight  COVID TEST- 06/26/21; done in PAT  Anesthesia review: Yes, hx of CAD. Cardiac clearance received 06/06/21-EPIC  Patient denies shortness of breath, fever, cough and chest pain at PAT appointment   All instructions explained to the patient, with a verbal understanding of the material. Patient agrees to go over the instructions while at home for a better understanding. Patient also instructed to self quarantine after being tested for COVID-19. The opportunity to ask questions was provided.

## 2021-06-27 ENCOUNTER — Other Ambulatory Visit: Payer: Self-pay | Admitting: Neurosurgery

## 2021-06-27 NOTE — Progress Notes (Signed)
Anesthesia Chart Review:  Follows with cardiology for hx of CAD/Ascending TAA (s/p Bentall with 23 mm pericardial AVR with CABGx2: SVG to LAD and CX Marginal due to issue with LM coronary button 01/04/16 [facility] in [place], Dr. Matthias Hughs; s/p redo median sternotomy, sterile abscess removal from Dacron graft, omental flap around aorta, CABGx1: LIMA-LAD on 03/13/17 [name], Dr. 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  Last seen by Dr. Marlou Porch 11/14/20, stable at that time, recommended 1 year followup.    Cardiac clearance per telephone encounter 06/06/21, "Chart reviewed as part of pre-operative protocol coverage. Patient was contacted 06/06/2021 in reference to pre-operative risk assessment for pending surgery as outlined below.  Erik Salazar was last seen on 11/14/2020 by Dr. Marlou Porch.  Since that day, Erik Salazar has done well without exertional chest pain or worsening dyspnea. Therefore, based on ACC/AHA guidelines, the patient would be at acceptable risk for the planned procedure without further cardiovascular testing. Patient has been instructed to hold aspirin for 7 days prior to the procedure and restart as soon as possible afterward at the surgeon's discretion."   Hx of CVA seizures and followed by Dr. [name].  Last seen 03/07/2021.  Remains on Keppra.  MRI ordered at appointment and per Dr. [name] "MRI of the brain shows chronic frontal lobe infarcts, known from previous history.  Minimal white matter changes otherwise noted.The history given by the patient is most consistent with rigors, if this recurs, he may need a work-up for an abscess somewhere in his body." This recommendation was based off complaint of overall body shaking he complained of at appt. Follow up in 3-4 months   Preop labs reviewed, sodium mildly low at 131, otherwise unremarkable.  EKG 10/06/20: NSR, RBBB, 75  bpm  TTE 12/08/20:  FINDINGS   Left Ventricle: Left ventricular ejection fraction, by estimation, is 55  to 60%. Left ventricular ejection fraction by 3D volume is 60 %. The left  ventricle has normal function. The left ventricle has no regional wall  motion abnormalities. The average  left ventricular global longitudinal strain is -20.1 %. The global  longitudinal strain is normal. The left ventricular internal cavity size  was normal in size. There is mild asymmetric left ventricular hypertrophy  of the basal-septal segment. Abnormal  (paradoxical) septal motion consistent with post-operative status. Left  ventricular diastolic parameters are consistent with Grade I diastolic  dysfunction (impaired relaxation).   MRI brain 03/10/21: IMPRESSION: 1. No acute intracranial abnormality. 2. Remote frontal lobe infarcts bilaterally. 3. Periventricular and subcortical T2 hyperintensities bilaterally are mildly advanced for age. The finding is nonspecific but can be seen in the setting of chronic microvascular ischemia, a demyelinating process such as multiple sclerosis, vasculitis, complicated migraine headaches, or as the sequelae of a prior infectious or inflammatory process.  CTA chest 03/09/20: IMPRESSION: 1. Stable postsurgical changes in the chest. The aortic graft is patent without complicating features. Negative for aortic dissection. 2. No acute abnormality in the chest. 3. Hyperdense cyst in left kidney upper pole. This is similar to the prior abdominal CT and probably represents a hemorrhagic or proteinaceous cyst.    Wynonia Musty [facility] Short Stay Center/Anesthesiology Phone [phone] 06/27/2021 9:51 AM

## 2021-06-27 NOTE — Anesthesia Preprocedure Evaluation (Addendum)
Anesthesia Evaluation  Patient identified by MRN, date of birth, ID band Patient awake    Reviewed: Allergy & Precautions, NPO status , Patient's Chart, lab work & pertinent test results, reviewed documented beta blocker date and time   History of Anesthesia Complications (+) PONV and history of anesthetic complications  Airway Mallampati: II  TM Distance: >3 FB Neck ROM: Limited    Dental no notable dental hx. (+) Teeth Intact, Dental Advisory Given, Caps   Pulmonary former smoker,    Pulmonary exam normal breath sounds clear to auscultation       Cardiovascular hypertension, Pt. on medications and Pt. on home beta blockers + CAD and + DVT  Normal cardiovascular exam Rhythm:Regular Rate:Normal  Hx/o DVT after knee surgery  CABG 2v +AVR 01/04/16 SVG to LAD and LCx  CABG x 1 LIMA-LAD + Ascending TAA repair 03/13/2017  EKG 10/06/20: NSR, RBBB, 75 bpm  TTE4/21/22:  Left Ventricle: Left ventricular ejection fraction, by estimation, is 55 to 60%. Left ventricular ejection fraction by 3D volume is 60 %. The left ventricle has normal function. The left ventricle has no regional wall motion abnormalities. The average left ventricular global longitudinal strain is -20.1 %. The global longitudinal strain is normal. The left ventricular internal cavity size was normal in size. There is mild asymmetric left ventricular hypertrophy of the basal-septal segment. Abnormal (paradoxical) septal motion consistent with post-operative status. Left ventricular diastolic parameters are consistent with Grade I diastolic dysfunction (impaired relaxation).   Neuro/Psych Seizures -, Well Controlled,  S/P bilateral frontal infarcts S/P AVR 2v CABG 01/04/16 with subsequent seizures which are controlled with Keppra  Residual mild cognitive deficits  Neuromuscular disease CVA, Residual Symptoms negative psych ROS   GI/Hepatic Neg liver ROS, GERD  ,   Endo/Other  Hyperlipidemia  Renal/GU negative Renal ROS   BPH negative genitourinary   Musculoskeletal  (+) Arthritis , Osteoarthritis,  Spondylolisthesis L5-S1  Hx/o cervical fusion   Abdominal   Peds  Hematology negative hematology ROS (+)   Anesthesia Other Findings   Reproductive/Obstetrics                          Anesthesia Physical Anesthesia Plan  ASA: 3  Anesthesia Plan: General   Post-op Pain Management:    Induction: Intravenous  PONV Risk Score and Plan: 4 or greater and Treatment may vary due to age or medical condition, Ondansetron and Dexamethasone  Airway Management Planned: Oral ETT and Video Laryngoscope Planned  Additional Equipment:   Intra-op Plan:   Post-operative Plan: Extubation in OR  Informed Consent: I have reviewed the patients History and Physical, chart, labs and discussed the procedure including the risks, benefits and alternatives for the proposed anesthesia with the patient or authorized representative who has indicated his/her understanding and acceptance.     Dental advisory given  Plan Discussed with: CRNA and Anesthesiologist  Anesthesia Plan Comments: (PAT note by Karoline Caldwell, PA-C: Follows with cardiology for hx ofCAD/Ascending TAA (s/p Bentall with 23 mm pericardial AVR with CABGx2: SVG to LAD and CX Marginal due to issue with LM coronary button 01/04/16 [facility] in [place], Dr. Matthias Hughs; s/p redo median sternotomy, sterile abscess removal fromDacron graft, omental flap around aorta, CABGx1:LIMA-LAD on 03/13/17 [name], Dr. Iona Beard Hughes),post-operative bilateral frontal lobe infarcts with seizures (03/14/17; recurrent seizure 02/2019 while tapering Keppra).Last seen by Dr. Marlou Porch 11/14/20, stable at that time, recommended 1 year followup.   Cardiac clearance per telephone encounter 06/06/21, "  Chart reviewed as part of pre-operative protocol coverage. Patient was  contacted10/18/2022in reference to pre-operative risk assessment for pending surgery as outlined below. Epic Pandonewas last seen on 3/28/2022by Dr. Marlou Porch. Since that day, Romaine Pandonehas done well without exertional chest pain or worsening dyspnea. Therefore, based on ACC/AHA guidelines, the patient would be at acceptable risk for the planned procedure without further cardiovascular testing.Patient has been instructed to hold aspirin for 7 days prior to the procedure and restart as soon as possible afterward at the surgeon's discretion."  Hx of CVA seizures and followed by Dr. [name].Last seen 03/07/2021. Remains on Keppra. MRI ordered at appointmentand per Dr. [name] "MRI of the brain shows chronic frontal lobe infarcts, known from previous history. Minimal white matter changes otherwise noted.The history given by the patient is most consistent with rigors, if this recurs, he may need a work-up for an abscess somewhere in his body." This recommendation was based off complaint of overall body shaking he complained of at appt. Follow up in 3-4 months  Preop labs reviewed, sodium mildly low at 131, otherwise unremarkable.  EKG 10/06/20: NSR, RBBB, 75 bpm  TTE4/21/22:  FINDINGS  Left Ventricle: Left ventricular ejection fraction, by estimation, is 55  to 60%. Left ventricular ejection fraction by 3D volume is 60 %. The left  ventricle has normal function. The left ventricle has no regional wall  motion abnormalities. The average  left ventricular global longitudinal strain is -20.1 %. The global  longitudinal strain is normal. The left ventricular internal cavity size  was normal in size. There is mild asymmetric left ventricular hypertrophy  of the basal-septal segment. Abnormal  (paradoxical) septal motion consistent with post-operative status. Left  ventricular diastolic parameters are consistent with Grade I diastolic  dysfunction (impaired relaxation).  MRI brain  03/10/21: IMPRESSION: 1. No acute intracranial abnormality. 2. Remote frontal lobe infarcts bilaterally. 3. Periventricular and subcortical T2 hyperintensities bilaterally are mildly advanced for age. The finding is nonspecific but can be seen in the setting of chronic microvascular ischemia, a demyelinating process such as multiple sclerosis, vasculitis, complicated migraine headaches, or as the sequelae of a prior infectious or inflammatory process.  CTA chest 03/09/20: IMPRESSION: 1. Stable postsurgical changes in the chest. The aortic graft is patent without complicating features. Negative for aortic dissection. 2. No acute abnormality in the chest. 3. Hyperdense cyst in left kidney upper pole. This is similar to the prior abdominal CT and probably represents a hemorrhagic or proteinaceous cyst. )     Anesthesia Quick Evaluation

## 2021-06-28 ENCOUNTER — Ambulatory Visit (HOSPITAL_COMMUNITY): Payer: Medicare HMO

## 2021-06-28 ENCOUNTER — Other Ambulatory Visit: Payer: Self-pay

## 2021-06-28 ENCOUNTER — Ambulatory Visit (HOSPITAL_COMMUNITY): Payer: Medicare HMO | Admitting: Physician Assistant

## 2021-06-28 ENCOUNTER — Encounter (HOSPITAL_COMMUNITY): Payer: Self-pay | Admitting: Neurosurgery

## 2021-06-28 ENCOUNTER — Ambulatory Visit (HOSPITAL_COMMUNITY): Payer: Medicare HMO | Admitting: Anesthesiology

## 2021-06-28 ENCOUNTER — Ambulatory Visit (HOSPITAL_COMMUNITY)
Admission: RE | Admit: 2021-06-28 | Discharge: 2021-06-29 | Disposition: A | Discharge status: Home / Self Care | Payer: Medicare HMO | Attending: Neurosurgery | Admitting: Neurosurgery

## 2021-06-28 ENCOUNTER — Encounter (HOSPITAL_COMMUNITY)
Admission: RE | Disposition: A | Discharge status: Home / Self Care | Payer: Self-pay | Source: Home / Self Care | Attending: Neurosurgery

## 2021-06-28 DIAGNOSIS — M48061 Spinal stenosis, lumbar region without neurogenic claudication: Secondary | ICD-10-CM | POA: Diagnosis not present

## 2021-06-28 DIAGNOSIS — M4317 Spondylolisthesis, lumbosacral region: Secondary | ICD-10-CM | POA: Diagnosis present

## 2021-06-28 DIAGNOSIS — Z981 Arthrodesis status: Secondary | ICD-10-CM | POA: Insufficient documentation

## 2021-06-28 DIAGNOSIS — E785 Hyperlipidemia, unspecified: Secondary | ICD-10-CM | POA: Diagnosis not present

## 2021-06-28 DIAGNOSIS — I2510 Atherosclerotic heart disease of native coronary artery without angina pectoris: Secondary | ICD-10-CM | POA: Diagnosis not present

## 2021-06-28 DIAGNOSIS — I10 Essential (primary) hypertension: Secondary | ICD-10-CM | POA: Insufficient documentation

## 2021-06-28 DIAGNOSIS — M7138 Other bursal cyst, other site: Secondary | ICD-10-CM | POA: Insufficient documentation

## 2021-06-28 DIAGNOSIS — I69319 Unspecified symptoms and signs involving cognitive functions following cerebral infarction: Secondary | ICD-10-CM | POA: Diagnosis not present

## 2021-06-28 DIAGNOSIS — M1990 Unspecified osteoarthritis, unspecified site: Secondary | ICD-10-CM | POA: Diagnosis not present

## 2021-06-28 DIAGNOSIS — Z419 Encounter for procedure for purposes other than remedying health state, unspecified: Secondary | ICD-10-CM

## 2021-06-28 DIAGNOSIS — Z953 Presence of xenogenic heart valve: Secondary | ICD-10-CM | POA: Insufficient documentation

## 2021-06-28 DIAGNOSIS — Z86718 Personal history of other venous thrombosis and embolism: Secondary | ICD-10-CM | POA: Diagnosis not present

## 2021-06-28 DIAGNOSIS — M4807 Spinal stenosis, lumbosacral region: Secondary | ICD-10-CM | POA: Insufficient documentation

## 2021-06-28 DIAGNOSIS — Z87891 Personal history of nicotine dependence: Secondary | ICD-10-CM | POA: Diagnosis not present

## 2021-06-28 DIAGNOSIS — G40909 Epilepsy, unspecified, not intractable, without status epilepticus: Secondary | ICD-10-CM | POA: Insufficient documentation

## 2021-06-28 DIAGNOSIS — Z951 Presence of aortocoronary bypass graft: Secondary | ICD-10-CM | POA: Insufficient documentation

## 2021-06-28 DIAGNOSIS — M4316 Spondylolisthesis, lumbar region: Secondary | ICD-10-CM

## 2021-06-28 SURGERY — POSTERIOR LUMBAR FUSION 1 LEVEL
Anesthesia: General | Site: Spine Lumbar

## 2021-06-28 MED ORDER — ROCURONIUM BROMIDE 10 MG/ML (PF) SYRINGE
PREFILLED_SYRINGE | INTRAVENOUS | Status: DC | PRN
Start: 2021-06-28 — End: 2021-06-28
  Administered 2021-06-28: 20 mg via INTRAVENOUS
  Administered 2021-06-28: 90 mg via INTRAVENOUS
  Administered 2021-06-28: 10 mg via INTRAVENOUS

## 2021-06-28 MED ORDER — ONDANSETRON HCL 4 MG/2ML IJ SOLN
INTRAMUSCULAR | Status: AC
  Filled 2021-06-28: qty 2

## 2021-06-28 MED ORDER — ORAL CARE MOUTH RINSE
15.0000 mL | Freq: Once | OROMUCOSAL | Status: AC
  Administered 2021-06-28: 15 mL via OROMUCOSAL

## 2021-06-28 MED ORDER — PHENYLEPHRINE HCL-NACL 20-0.9 MG/250ML-% IV SOLN
INTRAVENOUS | Status: DC | PRN
  Administered 2021-06-28: 25 ug/min via INTRAVENOUS

## 2021-06-28 MED ORDER — NEOSTIGMINE METHYLSULFATE 3 MG/3ML IV SOSY
PREFILLED_SYRINGE | INTRAVENOUS | Status: DC | PRN
  Administered 2021-06-28: 3 mg via INTRAVENOUS

## 2021-06-28 MED ORDER — LEVETIRACETAM 250 MG PO TABS
750.0000 mg | ORAL_TABLET | Freq: Two times a day (BID) | ORAL | Status: DC
  Administered 2021-06-28 – 2021-06-29 (×2): 750 mg via ORAL
  Filled 2021-06-28 (×3): qty 3

## 2021-06-28 MED ORDER — ONDANSETRON HCL 4 MG/2ML IJ SOLN: 4.0000 mg | Freq: Once | INTRAMUSCULAR | Status: AC | PRN

## 2021-06-28 MED ORDER — FENTANYL CITRATE (PF) 250 MCG/5ML IJ SOLN
INTRAMUSCULAR | Status: DC | PRN
  Administered 2021-06-28 (×3): 50 ug via INTRAVENOUS
  Administered 2021-06-28: 100 ug via INTRAVENOUS

## 2021-06-28 MED ORDER — ONDANSETRON HCL 4 MG/2ML IJ SOLN: 4.0000 mg | Freq: Four times a day (QID) | INTRAMUSCULAR | Status: DC | PRN

## 2021-06-28 MED ORDER — SODIUM CHLORIDE 0.9 % IV SOLN: 250.0000 mL | INTRAVENOUS | Status: DC

## 2021-06-28 MED ORDER — DROPERIDOL 2.5 MG/ML IJ SOLN: 0.6250 mg | Freq: Once | INTRAMUSCULAR | Status: DC | PRN

## 2021-06-28 MED ORDER — PHENOL 1.4 % MT LIQD: 1.0000 | OROMUCOSAL | Status: DC | PRN

## 2021-06-28 MED ORDER — PANTOPRAZOLE SODIUM 40 MG IV SOLR
40.0000 mg | Freq: Every day | INTRAVENOUS | Status: DC
  Administered 2021-06-28: 40 mg via INTRAVENOUS
  Filled 2021-06-28: qty 40

## 2021-06-28 MED ORDER — VANCOMYCIN HCL 1000 MG IV SOLR
INTRAVENOUS | Status: DC | PRN
  Administered 2021-06-28: 1000 mg via INTRAVENOUS

## 2021-06-28 MED ORDER — THROMBIN 20000 UNITS EX SOLR
CUTANEOUS | Status: AC
  Filled 2021-06-28: qty 20000

## 2021-06-28 MED ORDER — PROPOFOL 10 MG/ML IV BOLUS
INTRAVENOUS | Status: AC
  Filled 2021-06-28: qty 40

## 2021-06-28 MED ORDER — ACETAMINOPHEN 325 MG PO TABS: 650.0000 mg | ORAL_TABLET | ORAL | Status: DC | PRN

## 2021-06-28 MED ORDER — ASPIRIN EC 81 MG PO TBEC: 81.0000 mg | DELAYED_RELEASE_TABLET | Freq: Every day | ORAL | Status: DC

## 2021-06-28 MED ORDER — ONDANSETRON HCL 4 MG PO TABS: 4.0000 mg | ORAL_TABLET | Freq: Four times a day (QID) | ORAL | Status: DC | PRN

## 2021-06-28 MED ORDER — HYDROMORPHONE HCL 1 MG/ML IJ SOLN
INTRAMUSCULAR | Status: AC
  Filled 2021-06-28: qty 1

## 2021-06-28 MED ORDER — VITAMIN B-6 100 MG PO TABS: 100.0000 mg | ORAL_TABLET | Freq: Every day | ORAL | Status: DC

## 2021-06-28 MED ORDER — LIDOCAINE-EPINEPHRINE 1 %-1:100000 IJ SOLN
INTRAMUSCULAR | Status: DC | PRN
  Administered 2021-06-28: 10 mL

## 2021-06-28 MED ORDER — THROMBIN 5000 UNITS EX SOLR
CUTANEOUS | Status: AC
  Filled 2021-06-28: qty 5000

## 2021-06-28 MED ORDER — MIDAZOLAM HCL 2 MG/2ML IJ SOLN
INTRAMUSCULAR | Status: AC
  Filled 2021-06-28: qty 2

## 2021-06-28 MED ORDER — DEXAMETHASONE SODIUM PHOSPHATE 10 MG/ML IJ SOLN
INTRAMUSCULAR | Status: AC
  Filled 2021-06-28: qty 1

## 2021-06-28 MED ORDER — PROPOFOL 1000 MG/100ML IV EMUL
INTRAVENOUS | Status: AC
  Filled 2021-06-28: qty 100

## 2021-06-28 MED ORDER — 0.9 % SODIUM CHLORIDE (POUR BTL) OPTIME
TOPICAL | Status: DC | PRN
  Administered 2021-06-28: 1000 mL

## 2021-06-28 MED ORDER — ONDANSETRON HCL 4 MG/2ML IJ SOLN
INTRAMUSCULAR | Status: DC | PRN
  Administered 2021-06-28: 4 mg via INTRAVENOUS

## 2021-06-28 MED ORDER — HYDROMORPHONE HCL 1 MG/ML IJ SOLN
0.2500 mg | INTRAMUSCULAR | Status: DC | PRN
  Administered 2021-06-28 (×2): 0.25 mg via INTRAVENOUS

## 2021-06-28 MED ORDER — FENTANYL CITRATE (PF) 250 MCG/5ML IJ SOLN
INTRAMUSCULAR | Status: AC
  Filled 2021-06-28: qty 5

## 2021-06-28 MED ORDER — EPHEDRINE SULFATE-NACL 50-0.9 MG/10ML-% IV SOSY
PREFILLED_SYRINGE | INTRAVENOUS | Status: DC | PRN
  Administered 2021-06-28: 5 mg via INTRAVENOUS

## 2021-06-28 MED ORDER — GLYCOPYRROLATE PF 0.2 MG/ML IJ SOSY
PREFILLED_SYRINGE | INTRAMUSCULAR | Status: DC | PRN
  Administered 2021-06-28: .4 mg via INTRAVENOUS

## 2021-06-28 MED ORDER — CEFAZOLIN SODIUM-DEXTROSE 2-4 GM/100ML-% IV SOLN
2.0000 g | Freq: Three times a day (TID) | INTRAVENOUS | Status: AC
  Administered 2021-06-28 (×2): 2 g via INTRAVENOUS
  Filled 2021-06-28 (×2): qty 100

## 2021-06-28 MED ORDER — SODIUM CHLORIDE 0.9% FLUSH
3.0000 mL | Freq: Two times a day (BID) | INTRAVENOUS | Status: DC
  Administered 2021-06-28 (×2): 3 mL via INTRAVENOUS

## 2021-06-28 MED ORDER — CYCLOBENZAPRINE HCL 10 MG PO TABS
10.0000 mg | ORAL_TABLET | Freq: Three times a day (TID) | ORAL | Status: DC | PRN
  Administered 2021-06-28 – 2021-06-29 (×2): 10 mg via ORAL
  Filled 2021-06-28 (×2): qty 1

## 2021-06-28 MED ORDER — LACTATED RINGERS IV SOLN: INTRAVENOUS | Status: DC

## 2021-06-28 MED ORDER — HYDROCODONE-ACETAMINOPHEN 5-325 MG PO TABS
1.0000 | ORAL_TABLET | ORAL | Status: DC | PRN
  Administered 2021-06-28 – 2021-06-29 (×2): 1 via ORAL
  Filled 2021-06-28 (×2): qty 1

## 2021-06-28 MED ORDER — LIDOCAINE 2% (20 MG/ML) 5 ML SYRINGE
INTRAMUSCULAR | Status: AC
  Filled 2021-06-28: qty 5

## 2021-06-28 MED ORDER — METOPROLOL SUCCINATE ER 25 MG PO TB24
12.5000 mg | ORAL_TABLET | Freq: Every day | ORAL | Status: DC
  Administered 2021-06-28: 12.5 mg via ORAL
  Filled 2021-06-28 (×2): qty 1

## 2021-06-28 MED ORDER — ROCURONIUM BROMIDE 10 MG/ML (PF) SYRINGE
PREFILLED_SYRINGE | INTRAVENOUS | Status: AC
  Filled 2021-06-28: qty 10

## 2021-06-28 MED ORDER — BUPIVACAINE LIPOSOME 1.3 % IJ SUSP
INTRAMUSCULAR | Status: AC
  Filled 2021-06-28: qty 20

## 2021-06-28 MED ORDER — VITAMIN D3 50 MCG (2000 UT) PO TABS: 2000.0000 [IU] | ORAL_TABLET | Freq: Every day | ORAL | Status: DC

## 2021-06-28 MED ORDER — SUCCINYLCHOLINE CHLORIDE 200 MG/10ML IV SOSY
PREFILLED_SYRINGE | INTRAVENOUS | Status: AC
  Filled 2021-06-28: qty 10

## 2021-06-28 MED ORDER — SUCCINYLCHOLINE CHLORIDE 200 MG/10ML IV SOSY
PREFILLED_SYRINGE | INTRAVENOUS | Status: DC | PRN
  Administered 2021-06-28: 120 mg via INTRAVENOUS

## 2021-06-28 MED ORDER — NEOSTIGMINE METHYLSULFATE 3 MG/3ML IV SOSY
PREFILLED_SYRINGE | INTRAVENOUS | Status: AC
  Filled 2021-06-28: qty 3

## 2021-06-28 MED ORDER — SODIUM CHLORIDE 0.9% FLUSH: 3.0000 mL | INTRAVENOUS | Status: DC | PRN

## 2021-06-28 MED ORDER — ONDANSETRON HCL 4 MG/2ML IJ SOLN
INTRAMUSCULAR | Status: AC
  Administered 2021-06-28: 4 mg via INTRAVENOUS
  Filled 2021-06-28: qty 2

## 2021-06-28 MED ORDER — TAMSULOSIN HCL 0.4 MG PO CAPS
0.4000 mg | ORAL_CAPSULE | Freq: Every evening | ORAL | Status: DC
  Administered 2021-06-28: 0.4 mg via ORAL
  Filled 2021-06-28: qty 1

## 2021-06-28 MED ORDER — THROMBIN 20000 UNITS EX SOLR: CUTANEOUS | Status: DC | PRN

## 2021-06-28 MED ORDER — VANCOMYCIN HCL IN DEXTROSE 1-5 GM/200ML-% IV SOLN
1000.0000 mg | INTRAVENOUS | Status: AC
  Administered 2021-06-28: 1000 mg via INTRAVENOUS
  Filled 2021-06-28: qty 200

## 2021-06-28 MED ORDER — CHLORHEXIDINE GLUCONATE 0.12 % MT SOLN: 15.0000 mL | Freq: Once | OROMUCOSAL | Status: AC

## 2021-06-28 MED ORDER — LIDOCAINE-EPINEPHRINE 1 %-1:100000 IJ SOLN
INTRAMUSCULAR | Status: AC
  Filled 2021-06-28: qty 1

## 2021-06-28 MED ORDER — GLYCOPYRROLATE PF 0.2 MG/ML IJ SOSY
PREFILLED_SYRINGE | INTRAMUSCULAR | Status: AC
  Filled 2021-06-28: qty 2

## 2021-06-28 MED ORDER — PHENYLEPHRINE 40 MCG/ML (10ML) SYRINGE FOR IV PUSH (FOR BLOOD PRESSURE SUPPORT)
PREFILLED_SYRINGE | INTRAVENOUS | Status: DC | PRN
  Administered 2021-06-28 (×3): 80 ug via INTRAVENOUS

## 2021-06-28 MED ORDER — MENTHOL 3 MG MT LOZG: 1.0000 | LOZENGE | OROMUCOSAL | Status: DC | PRN

## 2021-06-28 MED ORDER — ALUM & MAG HYDROXIDE-SIMETH 200-200-20 MG/5ML PO SUSP: 30.0000 mL | Freq: Four times a day (QID) | ORAL | Status: DC | PRN

## 2021-06-28 MED ORDER — CHLORHEXIDINE GLUCONATE CLOTH 2 % EX PADS: 6.0000 | MEDICATED_PAD | Freq: Once | CUTANEOUS | Status: DC

## 2021-06-28 MED ORDER — ACETAMINOPHEN 650 MG RE SUPP: 650.0000 mg | RECTAL | Status: DC | PRN

## 2021-06-28 MED ORDER — HYDROMORPHONE HCL 1 MG/ML IJ SOLN: 0.5000 mg | INTRAMUSCULAR | Status: DC | PRN

## 2021-06-28 MED ORDER — BUPIVACAINE LIPOSOME 1.3 % IJ SUSP
INTRAMUSCULAR | Status: DC | PRN
  Administered 2021-06-28: 20 mL

## 2021-06-28 MED ORDER — PHENYLEPHRINE 40 MCG/ML (10ML) SYRINGE FOR IV PUSH (FOR BLOOD PRESSURE SUPPORT)
PREFILLED_SYRINGE | INTRAVENOUS | Status: AC
  Filled 2021-06-28: qty 10

## 2021-06-28 MED ORDER — PRAVASTATIN SODIUM 40 MG PO TABS
40.0000 mg | ORAL_TABLET | Freq: Every evening | ORAL | Status: DC
  Administered 2021-06-28: 40 mg via ORAL
  Filled 2021-06-28: qty 1

## 2021-06-28 MED ORDER — LIDOCAINE 2% (20 MG/ML) 5 ML SYRINGE
INTRAMUSCULAR | Status: DC | PRN
  Administered 2021-06-28: 60 mg via INTRAVENOUS

## 2021-06-28 MED ORDER — PROPOFOL 500 MG/50ML IV EMUL
INTRAVENOUS | Status: DC | PRN
  Administered 2021-06-28: 25 ug/kg/min via INTRAVENOUS

## 2021-06-28 MED ORDER — CYCLOBENZAPRINE HCL 10 MG PO TABS
ORAL_TABLET | ORAL | Status: AC
  Filled 2021-06-28: qty 1

## 2021-06-28 MED ORDER — MIDAZOLAM HCL 2 MG/2ML IJ SOLN
INTRAMUSCULAR | Status: DC | PRN
  Administered 2021-06-28: 2 mg via INTRAVENOUS

## 2021-06-28 MED ORDER — DEXAMETHASONE SODIUM PHOSPHATE 10 MG/ML IJ SOLN
10.0000 mg | Freq: Once | INTRAMUSCULAR | Status: AC
  Administered 2021-06-28: 10 mg via INTRAVENOUS
  Filled 2021-06-28: qty 1

## 2021-06-28 MED ORDER — PROPOFOL 10 MG/ML IV BOLUS
INTRAVENOUS | Status: DC | PRN
  Administered 2021-06-28: 120 mg via INTRAVENOUS

## 2021-06-28 MED ORDER — ASCORBIC ACID 500 MG PO TABS: 500.0000 mg | ORAL_TABLET | Freq: Every day | ORAL | Status: DC

## 2021-06-28 SURGICAL SUPPLY — 70 items
BAG COUNTER SPONGE SURGICOUNT (BAG) ×2 IMPLANT
BASKET BONE COLLECTION (BASKET) ×2 IMPLANT
BENZOIN TINCTURE PRP APPL 2/3 (GAUZE/BANDAGES/DRESSINGS) ×2 IMPLANT
BLADE CLIPPER SURG (BLADE) IMPLANT
BLADE SURG 11 STRL SS (BLADE) ×2 IMPLANT
BONE VIVIGEN FORMABLE 5.4CC (Bone Implant) ×2 IMPLANT
BUR CUTTER 7.0 ROUND (BURR) ×2 IMPLANT
BUR MATCHSTICK NEURO 3.0 LAGG (BURR) ×2 IMPLANT
CANISTER SUCT 3000ML PPV (MISCELLANEOUS) ×2 IMPLANT
CAP LOCKING THREADED (Cap) ×8 IMPLANT
CARTRIDGE OIL MAESTRO DRILL (MISCELLANEOUS) ×1 IMPLANT
CLSR STERI-STRIP ANTIMIC 1/2X4 (GAUZE/BANDAGES/DRESSINGS) ×2 IMPLANT
CNTNR URN SCR LID CUP LEK RST (MISCELLANEOUS) ×1 IMPLANT
CONT SPEC 4OZ STRL OR WHT (MISCELLANEOUS) ×1
COVER BACK TABLE 60X90IN (DRAPES) ×2 IMPLANT
DECANTER SPIKE VIAL GLASS SM (MISCELLANEOUS) IMPLANT
DERMABOND ADVANCED (GAUZE/BANDAGES/DRESSINGS) ×1
DERMABOND ADVANCED .7 DNX12 (GAUZE/BANDAGES/DRESSINGS) ×1 IMPLANT
DIFFUSER DRILL AIR PNEUMATIC (MISCELLANEOUS) ×2 IMPLANT
DRAPE C-ARM 42X72 X-RAY (DRAPES) ×2 IMPLANT
DRAPE C-ARMOR (DRAPES) ×2 IMPLANT
DRAPE HALF SHEET 40X57 (DRAPES) IMPLANT
DRAPE LAPAROTOMY 100X72X124 (DRAPES) ×2 IMPLANT
DRAPE SURG 17X23 STRL (DRAPES) ×2 IMPLANT
DRSG OPSITE 4X5.5 SM (GAUZE/BANDAGES/DRESSINGS) ×2 IMPLANT
DRSG OPSITE POSTOP 4X6 (GAUZE/BANDAGES/DRESSINGS) ×2 IMPLANT
DURAPREP 26ML APPLICATOR (WOUND CARE) ×2 IMPLANT
ELECT REM PT RETURN 9FT ADLT (ELECTROSURGICAL) ×2
ELECTRODE REM PT RTRN 9FT ADLT (ELECTROSURGICAL) ×1 IMPLANT
EVACUATOR 3/16  PVC DRAIN (DRAIN)
EVACUATOR 3/16 PVC DRAIN (DRAIN) IMPLANT
GAUZE 4X4 16PLY ~~LOC~~+RFID DBL (SPONGE) IMPLANT
GAUZE SPONGE 4X4 12PLY STRL (GAUZE/BANDAGES/DRESSINGS) ×2 IMPLANT
GLOVE EXAM NITRILE XL STR (GLOVE) IMPLANT
GLOVE SURG ENC MOIS LTX SZ7 (GLOVE) ×8 IMPLANT
GLOVE SURG ENC MOIS LTX SZ8 (GLOVE) ×4 IMPLANT
GLOVE SURG UNDER LTX SZ8.5 (GLOVE) ×4 IMPLANT
GLOVE SURG UNDER POLY LF SZ7 (GLOVE) ×4 IMPLANT
GOWN STRL REUS W/ TWL LRG LVL3 (GOWN DISPOSABLE) ×2 IMPLANT
GOWN STRL REUS W/ TWL XL LVL3 (GOWN DISPOSABLE) ×3 IMPLANT
GOWN STRL REUS W/TWL 2XL LVL3 (GOWN DISPOSABLE) IMPLANT
GOWN STRL REUS W/TWL LRG LVL3 (GOWN DISPOSABLE) ×2
GOWN STRL REUS W/TWL XL LVL3 (GOWN DISPOSABLE) ×3
GRAFT BONE PROTEIOS LRG 5CC (Orthopedic Implant) ×2 IMPLANT
IMPL SPINE MCS 6.5 5.5X35 (Neuro Prosthesis/Implant) ×2 IMPLANT
IMPLANT SPINE MCS 6.5 5.5X35 (Neuro Prosthesis/Implant) ×4 IMPLANT
KIT BASIN OR (CUSTOM PROCEDURE TRAY) ×2 IMPLANT
KIT GRAFTMAG DEL NEURO DISP (NEUROSURGERY SUPPLIES) IMPLANT
KIT TURNOVER KIT B (KITS) ×2 IMPLANT
MILL MEDIUM DISP (BLADE) ×2 IMPLANT
NEEDLE HYPO 25X1 1.5 SAFETY (NEEDLE) ×2 IMPLANT
NS IRRIG 1000ML POUR BTL (IV SOLUTION) ×2 IMPLANT
OIL CARTRIDGE MAESTRO DRILL (MISCELLANEOUS) ×2
PACK LAMINECTOMY NEURO (CUSTOM PROCEDURE TRAY) ×2 IMPLANT
PAD ARMBOARD 7.5X6 YLW CONV (MISCELLANEOUS) ×12 IMPLANT
ROD 40MM SPINAL (Rod) ×4 IMPLANT
SCREW MOD 6.0-5.0X35MM (Screw) ×4 IMPLANT
SCREW PA THRD CREO TULIP 5.5X4 (Head) ×8 IMPLANT
SPACER TITANIUM 8X26 11 13 (Spacer) ×4 IMPLANT
SPONGE SURGIFOAM ABS GEL 100 (HEMOSTASIS) ×2 IMPLANT
SPONGE T-LAP 4X18 ~~LOC~~+RFID (SPONGE) IMPLANT
STRIP CLOSURE SKIN 1/2X4 (GAUZE/BANDAGES/DRESSINGS) ×4 IMPLANT
SUT VIC AB 0 CT1 18XCR BRD8 (SUTURE) ×2 IMPLANT
SUT VIC AB 0 CT1 8-18 (SUTURE) ×2
SUT VIC AB 2-0 CT1 18 (SUTURE) ×2 IMPLANT
SUT VIC AB 4-0 PS2 27 (SUTURE) ×2 IMPLANT
TOWEL GREEN STERILE (TOWEL DISPOSABLE) ×2 IMPLANT
TOWEL GREEN STERILE FF (TOWEL DISPOSABLE) ×2 IMPLANT
TRAY FOLEY MTR SLVR 16FR STAT (SET/KITS/TRAYS/PACK) ×2 IMPLANT
WATER STERILE IRR 1000ML POUR (IV SOLUTION) ×2 IMPLANT

## 2021-06-28 NOTE — Anesthesia Procedure Notes (Signed)
Procedure Name: Intubation Date/Time: 06/28/2021 8:35 AM Performed by: Betha Loa, CRNA Pre-anesthesia Checklist: Patient identified, Emergency Drugs available, Suction available and Patient being monitored Patient Re-evaluated:Patient Re-evaluated prior to induction Oxygen Delivery Method: Circle System Utilized Preoxygenation: Pre-oxygenation with 100% oxygen Induction Type: IV induction Ventilation: Mask ventilation without difficulty Laryngoscope Size: Mac and 4 Grade View: Grade I Tube type: Oral Tube size: 7.5 mm Number of attempts: 1 Airway Equipment and Method: Stylet, Video-laryngoscopy and Rigid stylet Placement Confirmation: ETT inserted through vocal cords under direct vision, positive ETCO2 and breath sounds checked- equal and bilateral Secured at: 22 cm Tube secured with: Tape Dental Injury: Teeth and Oropharynx as per pre-operative assessment  Difficulty Due To: Difficulty was anticipated, Difficult Airway- due to limited oral opening and Difficult Airway- due to reduced neck mobility Comments: Previous Glidescope intubations

## 2021-06-28 NOTE — Op Note (Signed)
Preoperative diagnosis: Grade 1 spondylolisthesis L5-S1 bilateral pars defects lumbar spinal stenosis and foraminal stenosis L5-S1  Postoperative diagnosis: Same  Procedure: #1 complete decompressive laminectomy with complete medial facetectomies and radical foraminotomies of the L5 and S1 nerve roots with Gill decompression of the L5 nerve roots in excess and requiring more work than would be needed with a standard interbody fusion  2.  Posterior lumbar interbody fusion L5-S1 utilizing the globus insert and rotate titanium cages packed with locally harvested autograft mixed with vivigen and protios  3.  Cortical screw fixation L5-S1 utilizing the globus modular Creo cortical screw set  Surgeon: Dominica Severin Rashon Westrup  Assistant: Nash Shearer  Anesthesia: General  EBL: Minimal  HPI: 70 year old gentleman longstanding back pain bilateral hip and leg pain work-up revealed grade 1 spondylolisthesis L5-S1 with severe foraminal stenosis at the L5 and S1 nerve root synovial cyst coming off the left-sided facet joint.  Due to patient's progression of clinical syndrome imaging findings and failed conservative treatment I recommended decompression stabilization procedure at L5-S1.  I extensively went over the risks and benefits of the operation with him as well as perioperative course expectations of outcome and alternatives of surgery and he understood and agreed to proceed forward.  Operative procedure: Patient was brought into the OR was [place] general anesthesia positioned prone on the Wilson frame his back was prepped and draped in routine sterile fashion preoperative x-ray localized the appropriate level so after infiltration of 10 cc lidocaine with epi midline incision was made and Bovie electrocautery was used take down subcutaneous tissue and subperiosteal dissection was carried lamina of L4-L5 and S1 bilaterally.  Intraoperative x-ray confirmed indication appropriate level there was marked extensive  granulation tissue over the 5 1 facet extending up into the L4-5 facet and L4-5 laminar space.  So the spinal laminar complex was also noted markedly hypermobile so I then remove the spinous process performed a complete decompressive laminectomy with complete facetectomies there was an extensive mount of granulation tissue on the proximal aspect of the 5 root takeoff and this was all teased off of the dura densely adherent on the right side with a synovial cyst on the left this was all removed with performing complete and unroofing of the foramen of L5 bilaterally drilling off the inferomedial aspect of the L5 pedicle allowed visualization of the L5 nerve roots and facilitated skeletonization decompression.  Aggressive under biting the supra articulating facets decompressed then again allowed access to the lateral margin of disc base.  Then the S1 foramen was also unroofed.  At this point disc base was incised pseudo disc was cleaned out and utilizing sequential distraction with an 11 distractor in place this partially reduce the deformity and I selected 8 mm wide 11 mm 15 degree implants packed with locally harvested autograft mix and after adequate discectomy and endplate preparation inserted 1 cage packed extensive mount of autograft mix centrally and inserted the contralateral cage.  Fluoroscopy confirmed good position of the implants during the entire placement.  Then under fluoroscopy cortical screws were placed at L5 there was still large amount of pedicle available for access as well as S1 these were all performed under fluoroscopy and fluoroscopy confirmed good position of the implants.  Then all the foramina reinspected to confirm patency Gelfoam was overlaid top of the dura I assembled the heads advanced the screws little bit placed the rods compressed the L5 screw against the S1 screw and then anchored living in place.  Then reinspected the foramina replaced  Gelfoam meticulous hemostasis was maintained  the wound was copiously irrigated and the wound was closed in layers with interrupted Vicryl and a running 4 subcuticular.  Dermabond benzoin Steri-Strips and a sterile dressing was applied patient recovery in stable condition.  At the end the case all needle counts and sponge counts were correct.

## 2021-06-28 NOTE — H&P (Signed)
Erik Salazar is an 70 y.o. male.   Chief Complaint: Back and left greater than right leg pain HPI: 70 year old gentleman longstanding back pain bilateral leg pain worse on the left work-up revealed grade 1 spondylolisthesis L5-S1 with severe degenerative collapse and foraminal stenosis L5 and S1 nerve roots.  Due to patient's progressive clinical syndrome imaging findings and failed conservative treatment I recommended decompressive laminectomy interbody fusion at L5-S1.  I extensively went over the risks and benefits of the operation with him as well as perioperative course expectations of outcome and alternatives of surgery and he understood and agreed to proceed forward.  Past Medical History:  Diagnosis Date   Carpal tunnel syndrome on right 07/08/2017   Cervical radiculopathy at C6 6/60/6301   Right   Complication of anesthesia    Coronary artery disease    Enlarged prostate    H/O: knee surgery    15    History of open heart surgery    ASCENDING AORTIC ANEURYSM   Ischemic stroke of frontal lobe ([place]) 03/14/2017   Bilateral; post-redo CT surgery   PONV (postoperative nausea and vomiting)    only after CABG surgeries   Seizure disorder ([place]) 03/14/2017   Seizures ([place])    Status post knee surgery    DVT POST KNEE SURGERY    Past Surgical History:  Procedure Laterality Date   ANTERIOR CERVICAL DECOMP/DISCECTOMY FUSION N/A 05/04/2020   Procedure: Anterior Cervical Decompression/Discectomy Fuion Cervical three-four, Cervical four-five, Cervical five-six;  Surgeon: Kary Kos, MD;  Location: [facility];  Service: Neurosurgery;  Laterality: N/A;   BALLOON DILATION N/A 06/23/2019   Procedure: BALLOON DILATION;  Surgeon: Otis Brace, MD;  Location: [facility];  Service: Gastroenterology;  Laterality: N/A;   BENTALL PROCEDURE  01/04/2016   Bentall with 23 mm pericardial AVR; SVG-LAD, SVG-CX ([place])   BICEPS TENDON REPAIR Right    BIOPSY  06/23/2019    Procedure: BIOPSY;  Surgeon: Otis Brace, MD;  Location: [facility];  Service: Gastroenterology;;   CARDIAC SURGERY     ANUERSYM MAY 2017   Bentall procedure. Bioprosthetic aortic valve #23 mm bovine model #2700 TF ask, and 28 mm Gelweave woven vascular sinus of Valsalva graft   CARPAL TUNNEL RELEASE  08/2018   right hand    CORONARY ARTERY BYPASS GRAFT  01/04/2016   VG to LAD & VG to LCX   CORONARY ARTERY BYPASS GRAFT  03/13/2017   LIMA to LAD with steril abcess removal from dacron graft   ESOPHAGOGASTRODUODENOSCOPY (EGD) WITH PROPOFOL N/A 06/23/2019   Procedure: ESOPHAGOGASTRODUODENOSCOPY (EGD) WITH PROPOFOL;  Surgeon: Otis Brace, MD;  Location: [facility];  Service: Gastroenterology;  Laterality: N/A;   FALSE ANEURYSM REPAIR  03/13/2017   redo sternotomy, sterile abscess removal from Dacron graft, omental flap around aorta, CABG: LIMA-LAD ([name], Dr. Mart Piggs)   [place]  2018   Of pericardium 2018   INSERTION OF MESH  10/14/2020   Procedure: INSERTION OF MESH;  Surgeon: Michael Boston, MD;  Location: [facility];  Service: General;;   knee surgeries     13 surgeries on knee done before knee replacement    [place] N/A 05/19/2021   Procedure: LYSIS OF ADHESIONS;  Surgeon: Michael Boston, MD;  Location: [facility];  Service: General;  Laterality: N/A;  GEN & LOCAL   LYSIS OF ADHESION N/A 10/14/2020   Procedure: LYSIS OF ADHESION;  Surgeon: Michael Boston, MD;  Location: [facility]  OR;  Service: General;  Laterality: N/A;   open heart surgery     03-13-2017   REPLACEMENT TOTAL KNEE  2015   ROTATOR CUFF REPAIR     TONSILLECTOMY     removed as a child.   VENTRAL HERNIA REPAIR N/A 10/14/2020   Procedure: LAPAROSCOPIC VENTRAL HERNIA REPAIR WITH TAP BLOCK BILATERAL;  Surgeon: Michael Boston, MD;  Location: [facility];  Service: General;  Laterality: N/A;   VENTRAL HERNIA REPAIR N/A 05/19/2021   Procedure: LAPAROSCOPIC VENTRAL WALL  HERNIA REPAIR;  Surgeon: Michael Boston, MD;  Location: [facility];  Service: General;  Laterality: N/A;    Family History  Problem Relation Age of Onset   Aneurysm Mother        brain aneurysm for mother.    Social History:  reports that he has quit smoking. His smoking use included cigars. He has never used smokeless tobacco. He reports current alcohol use of about 1.0 standard drink per week. He reports that he does not use drugs.  Allergies:  Allergies  Allergen Reactions   Oxycodone Hcl Other (See Comments)    DELUSIONAL   Zetia [Ezetimibe] Other (See Comments)    Leg cramps   Augmentin [Amoxicillin-Pot Clavulanate] Nausea Only    Dizzy   Chlorhexidine Rash   Elemental Sulfur Rash    Medications Prior to Admission  Medication Sig Dispense Refill   levETIRAcetam (KEPPRA) 750 MG tablet Take 1 tablet (750 mg total) by mouth 2 (two) times daily. 180 tablet 3   Menthol, Topical Analgesic, (BENGAY EX) Apply 1 tablet topically daily as needed (back pain).     metoprolol succinate (TOPROL-XL) 25 MG 24 hr tablet Take 0.5 tablets (12.5 mg total) by mouth daily. With or immediately following a meal. (Patient taking differently: Take 12.5 mg by mouth at bedtime. With or immediately following a meal.) 45 tablet 3   pravastatin (PRAVACHOL) 40 MG tablet Take 1 tablet (40 mg total) by mouth every evening. 90 tablet 3   tamsulosin (FLOMAX) 0.4 MG CAPS capsule Take 0.4 mg by mouth every evening.      ascorbic acid (VITAMIN C) 500 MG tablet Take 500 mg by mouth daily. (Patient not taking: Reported on 06/21/2021)     aspirin EC 81 MG tablet Take 81 mg by mouth daily. Swallow whole. (Patient not taking: Reported on 06/21/2021)     Cholecalciferol (VITAMIN D3) 50 MCG (2000 UT) TABS Take 2,000 Units by mouth daily. (Patient not taking: Reported on 06/21/2021)     pyridOXINE (VITAMIN B-6) 100 MG tablet Take 100 mg by mouth daily. (Patient not taking: Reported on 06/21/2021)      Results for orders placed or  performed during the hospital encounter of 06/26/21 (from the past 48 hour(s))  CBC per protocol     Status: None   Collection Time: 06/26/21  8:28 AM  Result Value Ref Range   WBC 10.2 4.0 - 10.5 K/uL   RBC 5.65 4.22 - 5.81 MIL/uL   Hemoglobin 15.3 13.0 - 17.0 g/dL   HCT 47.9 39.0 - 52.0 %   MCV 84.8 80.0 - 100.0 fL   MCH 27.1 26.0 - 34.0 pg   MCHC 31.9 30.0 - 36.0 g/dL   RDW 13.6 11.5 - 15.5 %   Platelets 247 150 - 400 K/uL   nRBC 0.0 0.0 - 0.2 %    Comment: Performed at [facility], [place] [address], [place], [place] [address]  Basic metabolic panel per protocol  Status: Abnormal   Collection Time: 06/26/21  8:28 AM  Result Value Ref Range   Sodium 131 (L) 135 - 145 mmol/L   Potassium 4.9 3.5 - 5.1 mmol/L   Chloride 100 98 - 111 mmol/L   CO2 21 (L) 22 - 32 mmol/L   Glucose, Bld 100 (H) 70 - 99 mg/dL    Comment: Glucose reference range applies only to samples taken after fasting for at least 8 hours.   BUN 14 8 - 23 mg/dL   Creatinine, Ser 0.99 0.61 - 1.24 mg/dL   Calcium 9.3 8.9 - 10.3 mg/dL   GFR, Estimated >60 >60 mL/min    Comment: (NOTE) Calculated using the CKD-EPI Creatinine Equation (2021)    Anion gap 10 5 - 15    Comment: Performed at [facility] [address], [place], [place] [address]  Surgical pcr screen     Status: None   Collection Time: 06/26/21  8:28 AM   Specimen: Nasal Mucosa; Nasal Swab  Result Value Ref Range   MRSA, PCR NEGATIVE NEGATIVE   Staphylococcus aureus NEGATIVE NEGATIVE    Comment: (NOTE) The Xpert SA Assay (FDA approved for NASAL specimens in patients 96 years of age and older), is one component of a comprehensive surveillance program. It is not intended to diagnose infection nor to guide or monitor treatment. Performed at [facility], [place] [address], [place], [place] [address]   SARS CORONAVIRUS 2 (TAT 6-24 HRS) Nasopharyngeal Nasopharyngeal Swab     Status: None   Collection Time: 06/26/21  8:29 AM    Specimen: Nasopharyngeal Swab  Result Value Ref Range   SARS Coronavirus 2 NEGATIVE NEGATIVE    Comment: (NOTE) SARS-CoV-2 target nucleic acids are NOT DETECTED.  The SARS-CoV-2 RNA is generally detectable in upper and lower respiratory specimens during the acute phase of infection. Negative results do not preclude SARS-CoV-2 infection, do not rule out co-infections with other pathogens, and should not be used as the sole basis for treatment or other patient management decisions. Negative results must be combined with clinical observations, patient history, and epidemiological information. The expected result is Negative.  Fact Sheet for Patients: SugarRoll.be  Fact Sheet for Healthcare Providers: https://www.woods-mathews.com/  This test is not yet approved or cleared by the [place] FDA and  has been authorized for detection and/or diagnosis of SARS-CoV-2 by FDA under an Emergency Use Authorization (EUA). This EUA will remain  in effect (meaning this test can be used) for the duration of the COVID-19 declaration under Se ction 564(b)(1) of the Act, 21 U.S.C. section 360bbb-3(b)(1), unless the authorization is terminated or revoked sooner.  Performed at [facility], [place] [address], [place], [place] [address]   Type and screen [place]     Status: None   Collection Time: 06/26/21  8:43 AM  Result Value Ref Range   ABO/RH(D) O POS    Antibody Screen NEG    Sample Expiration      07/10/2021,2359 Performed at [facility], [place] [address], [place],  [address]    No results found.  Review of Systems  Musculoskeletal:  Positive for back pain.  Neurological:  Positive for numbness.   Blood pressure (!) 144/52, pulse 84, temperature 97.9 F (36.6 C), temperature source Oral, resp. rate 19, SpO2 97 %. Physical Exam HENT:     Head: Normocephalic.     Right Ear: Tympanic membrane normal.      Nose:  Nose normal.  Eyes:     Pupils: Pupils are equal, round, and reactive to light.  Cardiovascular:     Rate and Rhythm: Normal rate.  Pulmonary:     Effort: Pulmonary effort is normal.  Abdominal:     General: Abdomen is flat.  Musculoskeletal:        General: Normal range of motion.  Skin:    General: Skin is warm.  Neurological:     Mental Status: He is alert.     Comments: Strength 5 out of 5 iliopsoas, quads, hamstrings, gastrocs, into tibialis, and EHL.     Assessment/Plan 70 years old presents for decompression interbody fusion L5-S1  Elaina Hoops, MD 06/28/2021, 8:14 AM

## 2021-06-28 NOTE — Transfer of Care (Signed)
Immediate Anesthesia Transfer of Care Note  Patient: Erik Salazar  Procedure(s) Performed: Posterior Lumbar Interbody Fusion  - Lumbar five-Sacral one (Spine Lumbar)  Patient Location: PACU  Anesthesia Type:General  Level of Consciousness: patient cooperative and responds to stimulation  Airway & Oxygen Therapy: Patient Spontanous Breathing and Patient connected to nasal cannula oxygen  Post-op Assessment: Report given to RN, Post -op Vital signs reviewed and stable and Patient moving all extremities X 4  Post vital signs: Reviewed and stable  Last Vitals:  Vitals Value Taken Time  BP 134/60 06/28/21 1144  Temp    Pulse 80 06/28/21 1146  Resp 22 06/28/21 1145  SpO2 96 % 06/28/21 1146  Vitals shown include unvalidated device data.  Last Pain:  Vitals:   06/28/21 0707  TempSrc:   PainSc: 10-Worst pain ever      Patients Stated Pain Goal: 1 (30/10/40 4591)  Complications: No notable events documented.

## 2021-06-28 NOTE — Anesthesia Postprocedure Evaluation (Signed)
Anesthesia Post Note  Patient: Colan Vasil  Procedure(s) Performed: Posterior Lumbar Interbody Fusion  - Lumbar five-Sacral one (Spine Lumbar)     Patient location during evaluation: PACU Anesthesia Type: General Level of consciousness: awake and alert and oriented Pain management: pain level controlled Vital Signs Assessment: post-procedure vital signs reviewed and stable Respiratory status: spontaneous breathing, nonlabored ventilation, respiratory function stable and patient connected to nasal cannula oxygen Cardiovascular status: blood pressure returned to baseline and stable Postop Assessment: no apparent nausea or vomiting Anesthetic complications: no   No notable events documented.  Last Vitals:  Vitals:   06/28/21 1245 06/28/21 1300  BP: (!) 141/72 (!) 152/67  Pulse: 74 71  Resp: (!) 21 14  Temp:  (!) 36.1 C  SpO2: 97% 91%    Last Pain:  Vitals:   06/28/21 1215  TempSrc:   PainSc: 5                  Adlai Sinning A.

## 2021-06-29 DIAGNOSIS — M4317 Spondylolisthesis, lumbosacral region: Secondary | ICD-10-CM | POA: Diagnosis not present

## 2021-06-29 MED ORDER — HYDROCODONE-ACETAMINOPHEN 5-325 MG PO TABS: 1.0000 | ORAL_TABLET | ORAL | 0 refills | Status: DC | PRN

## 2021-06-29 MED ORDER — CYCLOBENZAPRINE HCL 10 MG PO TABS
10.0000 mg | ORAL_TABLET | Freq: Three times a day (TID) | ORAL | 0 refills | Status: DC | PRN
Start: 2021-06-29 — End: 2021-07-31

## 2021-06-29 NOTE — Progress Notes (Signed)
Patient awaiting for transport to his vehicle via wheelchair by volunteer for discharge home; in no acute distress nor complaints of pain nor discomfort; incision on his back with honeycomb dressing and is clean, dry and intact; room was checked and accounted for all his belongings; discharge instructions concerning his medications, wound care, follow up appointment and when to call the doctor as needed were all discussed with patient and his wife by RN and both expressed understanding on the instructions given.

## 2021-06-29 NOTE — Progress Notes (Signed)
PT Cancellation Note  Patient Details Name: Eann Cleland MRN: [id number] DOB: 1950/11/08   Cancelled Treatment:    Reason Eval/Treat Not Completed: PT screened, no needs identified, will sign off. Pt and nursing staff confirm the pt has been mobilizing independently for community distances. Pt has no stairs to manage at home and is able to verbalize back precautions and log roll technique. Pt has no current PT needs at this time. Acute PT signing off.   Zenaida Niece 06/29/2021, 8:02 AM

## 2021-06-29 NOTE — Discharge Summary (Signed)
Physician Discharge Summary  Patient ID: Erik Salazar MRN: [id number] DOB/AGE: [birth year] 70 y.o.  Admit date: 06/28/2021 Discharge date: 06/29/2021  Admission Diagnoses:  Grade 1 spondylolisthesis L5-S1 bilateral pars defects lumbar spinal stenosis and foraminal stenosis L5-S1    Discharge Diagnoses: same   Discharged Condition: good  Hospital Course: The patient was admitted on 06/28/2021 and taken to the operating room where the patient underwent PLIF L5-S1. The patient tolerated the procedure well and was taken to the recovery room and then to the floor in stable condition. The hospital course was routine. There were no complications. The wound remained clean dry and intact. Pt had appropriate back soreness. No complaints of leg pain or new N/T/W. The patient remained afebrile with stable vital signs, and tolerated a regular diet. The patient continued to increase activities, and pain was well controlled with oral pain medications.   Consults: None  Significant Diagnostic Studies:  Results for orders placed or performed during the hospital encounter of 06/26/21  Surgical pcr screen   Specimen: Nasal Mucosa; Nasal Swab  Result Value Ref Range   MRSA, PCR NEGATIVE NEGATIVE   Staphylococcus aureus NEGATIVE NEGATIVE  SARS CORONAVIRUS 2 (TAT 6-24 HRS) Nasopharyngeal Nasopharyngeal Swab   Specimen: Nasopharyngeal Swab  Result Value Ref Range   SARS Coronavirus 2 NEGATIVE NEGATIVE  CBC per protocol  Result Value Ref Range   WBC 10.2 4.0 - 10.5 K/uL   RBC 5.65 4.22 - 5.81 MIL/uL   Hemoglobin 15.3 13.0 - 17.0 g/dL   HCT 47.9 39.0 - 52.0 %   MCV 84.8 80.0 - 100.0 fL   MCH 27.1 26.0 - 34.0 pg   MCHC 31.9 30.0 - 36.0 g/dL   RDW 13.6 11.5 - 15.5 %   Platelets 247 150 - 400 K/uL   nRBC 0.0 0.0 - 0.2 %  Basic metabolic panel per protocol  Result Value Ref Range   Sodium 131 (L) 135 - 145 mmol/L   Potassium 4.9 3.5 - 5.1 mmol/L   Chloride 100 98 - 111 mmol/L   CO2 21 (L) 22 - 32  mmol/L   Glucose, Bld 100 (H) 70 - 99 mg/dL   BUN 14 8 - 23 mg/dL   Creatinine, Ser 0.99 0.61 - 1.24 mg/dL   Calcium 9.3 8.9 - 10.3 mg/dL   GFR, Estimated >60 >60 mL/min   Anion gap 10 5 - 15  Type and screen [place]  Result Value Ref Range   ABO/RH(D) O POS    Antibody Screen NEG    Sample Expiration      07/10/2021,2359 Performed at [facility], [address] [place], [place] [address]     DG Lumbar Spine 2-3 Views  Result Date: 06/28/2021 CLINICAL DATA:  Back pain, lumbar fusion EXAM: LUMBAR SPINE - 2-3 VIEW COMPARISON:  MR lumbar spine done on 04/08/2021 FINDINGS: Fluoroscopic images show posterior surgical fusion at L5-S1 level. Intervertebral disc spacer is noted. Fluoroscopic time is 60 seconds. Radiation dose is 52.99 mGy. Two images were obtained. IMPRESSION: Fluoroscopic assistance was provided for surgical fusion at L5-S1 level. Electronically Signed   By: Elmer Picker M.D.   On: 06/28/2021 12:53    Antibiotics:  Anti-infectives (From admission, onward)    Start     Dose/Rate Route Frequency Ordered Stop   06/28/21 1630  ceFAZolin (ANCEF) IVPB 2g/100 mL premix        2 g 200 mL/hr over 30 Minutes Intravenous Every 8 hours 06/28/21 1335 06/29/21  0025   06/28/21 0645  vancomycin (VANCOCIN) IVPB 1000 mg/200 mL premix        1,000 mg 200 mL/hr over 60 Minutes Intravenous On call to O.R. 06/28/21 0640 06/28/21 0825       Discharge Exam: Blood pressure (!) 100/44, pulse 93, temperature 98.5 F (36.9 C), temperature source Oral, resp. rate 18, SpO2 95 %. Neurologic: Grossly normal Ambulating and voiding well, incision cdi   Discharge Medications:   Allergies as of 06/29/2021       Reactions   Oxycodone Hcl Other (See Comments)   DELUSIONAL   Zetia [ezetimibe] Other (See Comments)   Leg cramps   Augmentin [amoxicillin-pot Clavulanate] Nausea Only   Dizzy   Chlorhexidine Rash   Elemental Sulfur Rash        Medication List      TAKE these medications    ascorbic acid 500 MG tablet Commonly known as: VITAMIN C Take 500 mg by mouth daily.   aspirin EC 81 MG tablet Take 81 mg by mouth daily. Swallow whole.   BENGAY EX Apply 1 tablet topically daily as needed (back pain).   cyclobenzaprine 10 MG tablet Commonly known as: FLEXERIL Take 1 tablet (10 mg total) by mouth 3 (three) times daily as needed for muscle spasms.   HYDROcodone-acetaminophen 5-325 MG tablet Commonly known as: NORCO/VICODIN Take 1-2 tablets by mouth every 4 (four) hours as needed for moderate pain or severe pain.   levETIRAcetam 750 MG tablet Commonly known as: KEPPRA Take 1 tablet (750 mg total) by mouth 2 (two) times daily.   metoprolol succinate 25 MG 24 hr tablet Commonly known as: TOPROL-XL Take 0.5 tablets (12.5 mg total) by mouth daily. With or immediately following a meal. What changed: when to take this   pravastatin 40 MG tablet Commonly known as: PRAVACHOL Take 1 tablet (40 mg total) by mouth every evening.   pyridOXINE 100 MG tablet Commonly known as: VITAMIN B-6 Take 100 mg by mouth daily.   tamsulosin 0.4 MG Caps capsule Commonly known as: FLOMAX Take 0.4 mg by mouth every evening.   Vitamin D3 50 MCG (2000 UT) Tabs Take 2,000 Units by mouth daily.        Disposition: home   Final Dx:  PLIF L5-S1  Discharge Instructions      Remove dressing in 72 hours   Complete by: As directed    Call MD for:  difficulty breathing, headache or visual disturbances   Complete by: As directed    Call MD for:  hives   Complete by: As directed    Call MD for:  persistant dizziness or light-headedness   Complete by: As directed    Call MD for:  persistant nausea and vomiting   Complete by: As directed    Call MD for:  redness, tenderness, or signs of infection (pain, swelling, redness, odor or green/yellow discharge around incision site)   Complete by: As directed    Call MD for:  severe uncontrolled pain    Complete by: As directed    Call MD for:  temperature >100.4   Complete by: As directed    Diet - low sodium heart healthy   Complete by: As directed    Driving Restrictions   Complete by: As directed    No driving for 2 weeks, no riding in the car for 1 week   Increase activity slowly   Complete by: As directed  Signed: Ocie Cornfield Laytoya Ion 06/29/2021, 8:07 AM

## 2021-06-29 NOTE — Discharge Instructions (Signed)

## 2021-06-29 NOTE — Progress Notes (Signed)
OT Cancellation Note  Patient Details Name: Erik Salazar MRN: [id number] DOB: 1950/10/14   Cancelled Treatment:    Reason Eval/Treat Not Completed: OT screened, no needs identified, will sign off. Pt reporting he feels comfortable with back precautions and compensatory techniques. Able to verbalize how he will perform ADLs while adhering to precautions. All acute OT needs met and will sign off.   [place], OTR/L Acute Rehab Pager: [phone] Office: [phone] 06/29/2021, 8:42 AM

## 2021-06-29 NOTE — Plan of Care (Signed)

## 2021-07-12 ENCOUNTER — Ambulatory Visit: Payer: Medicare HMO | Admitting: Neurology

## 2021-07-18 ENCOUNTER — Ambulatory Visit: Payer: Medicare HMO | Admitting: Neurology

## 2021-07-18 ENCOUNTER — Telehealth: Payer: Self-pay | Admitting: Family Medicine

## 2021-07-18 NOTE — Telephone Encounter (Signed)
Reassigned Dr. Tobey Grim pt to Dr. Krista Blue for tremors. Scheduled appt 07/18/21 at 2:30p

## 2021-07-18 NOTE — Telephone Encounter (Signed)
Pt cancelled appt with Dr. Krista Blue due to scheduling conflict. Pt said will call back to reschedule.

## 2021-07-31 ENCOUNTER — Ambulatory Visit (INDEPENDENT_AMBULATORY_CARE_PROVIDER_SITE_OTHER): Payer: Medicare HMO | Admitting: Neurology

## 2021-07-31 ENCOUNTER — Encounter: Payer: Self-pay | Admitting: Neurology

## 2021-07-31 DIAGNOSIS — I639 Cerebral infarction, unspecified: Secondary | ICD-10-CM | POA: Diagnosis not present

## 2021-07-31 DIAGNOSIS — R569 Unspecified convulsions: Secondary | ICD-10-CM | POA: Diagnosis not present

## 2021-07-31 MED ORDER — LEVETIRACETAM 1000 MG PO TABS
1000.0000 mg | ORAL_TABLET | Freq: Two times a day (BID) | ORAL | 4 refills | Status: DC
  Filled 2021-11-15 – 2022-01-16 (×3): qty 180, 90d supply, fill #0
  Filled 2022-04-10: qty 120, 60d supply, fill #1
  Filled 2022-04-11: qty 60, 30d supply, fill #1
  Filled 2022-07-08: qty 180, 90d supply, fill #2
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
## 2021-08-08 ENCOUNTER — Other Ambulatory Visit: Payer: Self-pay | Admitting: Cardiothoracic Surgery

## 2021-08-08 DIAGNOSIS — I7121 Aneurysm of the ascending aorta, without rupture: Secondary | ICD-10-CM

## 2021-08-09 ENCOUNTER — Ambulatory Visit (INDEPENDENT_AMBULATORY_CARE_PROVIDER_SITE_OTHER): Payer: Medicare HMO | Admitting: Neurology

## 2021-08-09 ENCOUNTER — Telehealth: Payer: Self-pay | Admitting: Neurology

## 2021-08-09 ENCOUNTER — Encounter: Payer: Self-pay | Admitting: *Deleted

## 2021-08-09 DIAGNOSIS — R569 Unspecified convulsions: Secondary | ICD-10-CM

## 2021-08-09 DIAGNOSIS — I639 Cerebral infarction, unspecified: Secondary | ICD-10-CM

## 2021-08-09 NOTE — Telephone Encounter (Signed)
Spoke to pt, states during last visit with Dr. Krista Blue they discuss Letter explaining his wife is caregiver. Pt states this is not urgent and can wait next week when Dr Krista Blue is in office.

## 2021-08-09 NOTE — Telephone Encounter (Signed)
Left message for a return call

## 2021-08-09 NOTE — Telephone Encounter (Signed)
The following statement has been added:  For his safety and medical needs, he requires the full time assistance of his primary caregiver, Rafi Kenneth.

## 2021-08-09 NOTE — Telephone Encounter (Addendum)
I spoke to the patient. He has previously been given a letter to excuse him from jury duty. His wife is his primary caregiver. Says she has to help with his ADL's, medications and does all the driving. He needs the same letter with an additional statement added about his wife. This will be updated under Dr. Rhea Belton name and he can print it out of his mychart. He appreciated the assistance.  _____________________________________  Past Letter: To whom it may concern:               Mr. Erik Salazar is a 70 year old gentleman with a prior history of brain injury from stroke.  He has long-lasting effects from the stroke event with short-term memory issues and decreased ability to focus and concentrate.  For this reason, he is not an adequate candidate for jury duty.  If any questions arise concerning above matter, please do not hesitate to contact me.               Sincerely,   Margette Fast, MD

## 2021-08-09 NOTE — Telephone Encounter (Signed)
Pt came in today for EEG, Dr. Krista Blue was writing a letter for his wife to caretake and it's not in the file. Once complete can you send a message vis [employer] please. Thank you

## 2021-08-17 ENCOUNTER — Telehealth: Payer: Self-pay | Admitting: Neurology

## 2021-08-17 NOTE — Telephone Encounter (Signed)
Pt called wanting to know when he will be getting his EEG results. Please advise.

## 2021-08-17 NOTE — Telephone Encounter (Signed)
Pt has called back asking for the results to his EEG as soon as they are available.  Pt aware there is an allotted 24-48 hours for messages to be responded to and 7-10 days for the test results to become available.

## 2021-08-18 ENCOUNTER — Ambulatory Visit (INDEPENDENT_AMBULATORY_CARE_PROVIDER_SITE_OTHER): Payer: Medicare HMO | Admitting: Family

## 2021-08-18 ENCOUNTER — Encounter: Payer: Self-pay | Admitting: Family

## 2021-08-18 VITALS — BP 120/76 | HR 76 | Temp 97.5°F | Ht 69.0 in | Wt 202.2 lb

## 2021-08-18 DIAGNOSIS — R569 Unspecified convulsions: Secondary | ICD-10-CM

## 2021-08-18 DIAGNOSIS — E782 Mixed hyperlipidemia: Secondary | ICD-10-CM

## 2021-08-18 DIAGNOSIS — I10 Essential (primary) hypertension: Secondary | ICD-10-CM

## 2021-08-18 DIAGNOSIS — N400 Enlarged prostate without lower urinary tract symptoms: Secondary | ICD-10-CM

## 2021-08-18 DIAGNOSIS — Z1211 Encounter for screening for malignant neoplasm of colon: Secondary | ICD-10-CM | POA: Diagnosis not present

## 2021-08-18 NOTE — Progress Notes (Signed)
Erik Salazar is a 70 y.o. male with the following history as recorded in Epic[employer]:  Patient Active Problem List   Diagnosis Date Noted   Spondylolisthesis at L4-L5 level 06/28/2021   S/P repair of ventral hernia 05/19/2021   S/P repair of recurrent ventral hernia 05/19/2021   Benign prostatic hyperplasia without lower urinary tract symptoms 10/14/2020   Chronic pain 10/14/2020   ED (erectile dysfunction) of organic origin 10/14/2020   Esophageal dysphagia 10/14/2020   Gastroesophageal reflux disease 10/14/2020   History of aortic valve replacement with bioprosthetic valve 10/14/2020   Hx of aortic aneurysm repair 10/14/2020   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[place]) 10/14/2020   Thoracic aortic aneurysm without rupture 10/14/2020   Recurrent incisional hernia 10/14/2020   Spinal stenosis in cervical region 05/04/2020   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   Cervical radiculopathy at C6 11/14/2017   Carpal tunnel syndrome on right 07/08/2017   Tremor, essential 07/08/2017   Ischemic stroke of frontal lobe ([place]) 04/05/2017   Pain in right hand 04/05/2017   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 03/27/2017   Seizure ([place]) 03/14/2017   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/13/2017   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     aspirin EC 81 MG tablet Take 81 mg by mouth daily. Swallow whole.     Cholecalciferol (VITAMIN D3) 50 MCG (2000 UT) TABS Take 2,000 Units by mouth daily.     levETIRAcetam (KEPPRA) 1000 MG tablet Take 1 tablet (1,000 mg total) by mouth 2 (two) times daily. 180 tablet 4   metoprolol succinate (TOPROL-XL) 25 MG 24 hr tablet Take 0.5 tablets (12.5 mg total) by mouth daily. With or immediately following a meal. (Patient taking differently: Take 12.5 mg by mouth at  bedtime. With or immediately following a meal.) 45 tablet 3   pravastatin (PRAVACHOL) 40 MG tablet Take 1 tablet (40 mg total) by mouth every evening. 90 tablet 3   pyridOXINE (VITAMIN B-6) 100 MG tablet Take 100 mg by mouth daily.     tamsulosin (FLOMAX) 0.4 MG CAPS capsule Take 0.4 mg by mouth every evening.      No current facility-administered medications for this visit.    Allergies: Oxycodone hcl, Zetia [ezetimibe], Augmentin [amoxicillin-pot clavulanate], Chlorhexidine, and Elemental sulfur  Past Medical History:  Diagnosis Date   Arthritis    Carpal tunnel syndrome on right 07/08/2017   Cervical radiculopathy at C6 26/83/4196   Right   Complication of anesthesia    Coronary artery disease    Enlarged prostate    H/O: knee surgery    15    History of open heart surgery    ASCENDING AORTIC ANEURYSM   Ischemic stroke of frontal lobe ([place]) 03/14/2017   Bilateral; post-redo CT surgery   PONV (postoperative nausea and vomiting)    only after CABG surgeries   Seizure disorder ([place]) 03/14/2017   Seizures ([place])    Status post knee surgery    DVT POST KNEE SURGERY    Past Surgical History:  Procedure Laterality Date   ANTERIOR CERVICAL DECOMP/DISCECTOMY FUSION N/A 05/04/2020   Procedure: Anterior Cervical Decompression/Discectomy Fuion Cervical three-four, Cervical four-five, Cervical five-six;  Surgeon: Kary Kos, MD;  Location: [facility];  Service: Neurosurgery;  Laterality: N/A;   BALLOON DILATION N/A 06/23/2019   Procedure:  BALLOON DILATION;  Surgeon: Otis Brace, MD;  Location: [facility];  Service: Gastroenterology;  Laterality: N/A;   BENTALL PROCEDURE  01/04/2016   Bentall with 23 mm pericardial AVR; SVG-LAD, SVG-CX ([place])   BICEPS TENDON REPAIR Right    BIOPSY  06/23/2019   Procedure: BIOPSY;  Surgeon: Otis Brace, MD;  Location: [facility];  Service: Gastroenterology;;   CARDIAC SURGERY     ANUERSYM MAY 2017   Bentall procedure.  Bioprosthetic aortic valve #23 mm bovine model #2700 TF ask, and 28 mm Gelweave woven vascular sinus of Valsalva graft   CARPAL TUNNEL RELEASE  08/2018   right hand    CORONARY ARTERY BYPASS GRAFT  01/04/2016   VG to LAD & VG to LCX   CORONARY ARTERY BYPASS GRAFT  03/13/2017   LIMA to LAD with steril abcess removal from dacron graft   ESOPHAGOGASTRODUODENOSCOPY (EGD) WITH PROPOFOL N/A 06/23/2019   Procedure: ESOPHAGOGASTRODUODENOSCOPY (EGD) WITH PROPOFOL;  Surgeon: Otis Brace, MD;  Location: [facility];  Service: Gastroenterology;  Laterality: N/A;   FALSE ANEURYSM REPAIR  03/13/2017   redo sternotomy, sterile abscess removal from Dacron graft, omental flap around aorta, CABG: LIMA-LAD ([name], Dr. Mart Piggs)   [place]  2018   Of pericardium 2018   INSERTION OF MESH  10/14/2020   Procedure: INSERTION OF MESH;  Surgeon: Michael Boston, MD;  Location: [facility];  Service: General;;   knee surgeries     13 surgeries on knee done before knee replacement    [place] N/A 05/19/2021   Procedure: LYSIS OF ADHESIONS;  Surgeon: Michael Boston, MD;  Location: [facility];  Service: General;  Laterality: N/A;  GEN & LOCAL   LYSIS OF ADHESION N/A 10/14/2020   Procedure: LYSIS OF ADHESION;  Surgeon: Michael Boston, MD;  Location: [facility];  Service: General;  Laterality: N/A;   open heart surgery     03-13-2017   REPLACEMENT TOTAL KNEE  2015   ROTATOR CUFF REPAIR     TONSILLECTOMY     removed as a child.   VENTRAL HERNIA REPAIR N/A 10/14/2020   Procedure: LAPAROSCOPIC VENTRAL HERNIA REPAIR WITH TAP BLOCK BILATERAL;  Surgeon: Michael Boston, MD;  Location: [facility];  Service: General;  Laterality: N/A;   VENTRAL HERNIA REPAIR N/A 05/19/2021   Procedure: LAPAROSCOPIC VENTRAL WALL HERNIA REPAIR;  Surgeon: Michael Boston, MD;  Location: [facility];  Service: General;  Laterality: N/A;    Family History  Problem Relation Age of Onset   Arthritis Mother     Aneurysm Mother        brain aneurysm for mother.    Arthritis Father     Social History   Tobacco Use   Smoking status: Former    Types: Cigars   Smokeless tobacco: Never  Substance Use Topics   Alcohol use: Yes    Alcohol/week: 1.0 standard drink    Types: 1 Cans of beer per week    Comment: occassionally    Subjective:   Patient presents today as a new patient; accompanied by wife; majority of care if managed by specialists including neurology and cardiology;  Needs referral for GI consult- due for 5 year follow up with colonoscopy;  Dr. Gloriann Loan- urology;    Objective:  Vitals:   08/18/21 0849  BP: 120/76  Pulse: 76  Temp: (!) 97.5 F (36.4 C)  TempSrc: Oral  SpO2: 93%  Weight: 202 lb 3.2 oz (  91.7 kg)  Height: 5\' 9"  (1.753 m)    General: Well developed, well nourished, in no acute distress  Skin : Warm and dry.  Head: Normocephalic and atraumatic  Eyes: Sclera and conjunctiva clear; pupils round and reactive to light; extraocular movements intact  Ears: External normal; canals clear; tympanic membranes normal  Lungs: Respirations unlabored; clear to auscultation bilaterally without wheeze, rales, rhonchi  CVS exam: normal rate and regular rhythm.  Neurologic: Alert and oriented; speech intact; face symmetrical; moves all extremities well; CNII-XII intact without focal deficit   Assessment:  1. Encounter for screening colonoscopy   2. Seizure ([place])   3. Essential hypertension   4. Mixed hyperlipidemia   5. Benign prostatic hyperplasia, unspecified whether lower urinary tract symptoms present     Plan:  Referral to GI to discuss options for colon cancer screening; due to underlying health conditions, he will need a consult visit prior to procedure.  Under care of neurology; & 4. Under care of cardiology;  5.   Stable; under care of Dr. Gloriann Loan at [facility];  This visit occurred during the SARS-CoV-2 public health emergency.  Safety protocols were in  place, including screening questions prior to the visit, additional usage of staff PPE, and extensive cleaning of exam room while observing appropriate contact time as indicated for disinfecting solutions.    No follow-ups on file.  Orders Placed This Encounter  Procedures   Ambulatory referral to Gastroenterology    Referral Priority:   Routine    Referral Type:   Consultation    Referral Reason:   Specialty Services Required    Number of Visits Requested:   1    Requested Prescriptions    No prescriptions requested or ordered in this encounter

## 2021-08-23 NOTE — Telephone Encounter (Signed)
Pt verified by name and DOB,  normal results given per provider, pt voiced understanding all question answered. °

## 2021-08-23 NOTE — Telephone Encounter (Signed)
Please call patient, EEG showed no significant abnormality,  He should keep on Keppra 1000 mg twice a day

## 2021-08-23 NOTE — Procedures (Signed)
° °  HISTORY: 71 year old male history of bilateral frontal stroke, presented with possible partial seizure  TECHNIQUE:  This is a routine 16 channel EEG recording with one channel devoted to a limited EKG recording.  It was performed during wakefulness, drowsiness and asleep.  Hyperventilation and photic stimulation were performed as activating procedures.  There are minimum muscle and movement artifact noted.  Upon maximum arousal, posterior dominant waking rhythm consistent of mildly dysrhythmic alpha range activity, with low amplitude, activities are symmetric over the bilateral posterior derivations and attenuated with eye opening.  Hyperventilation produced mild/moderate buildup with higher amplitude and the slower activities noted.  Photic stimulation did not alter the tracing.  During EEG recording, patient developed drowsiness and no deeper stage of sleep was achieved  During EEG recording, there was no epileptiform discharge noted.  EKG demonstrate sinus rhythm, with heart rate of 84 bpm.  CONCLUSION: This is a  normal awake EEG.  There is no electrodiagnostic evidence of epileptiform discharge.  Marcial Pacas, M.D. Ph.D.  [facility] [place], [place] [address] Phone: [phone] Fax:      [phone]

## 2021-08-24 ENCOUNTER — Encounter: Payer: Self-pay | Admitting: Physical Therapy

## 2021-08-24 ENCOUNTER — Other Ambulatory Visit: Payer: Self-pay

## 2021-08-24 ENCOUNTER — Ambulatory Visit: Payer: Medicare HMO | Attending: Neurosurgery | Admitting: Physical Therapy

## 2021-08-24 DIAGNOSIS — G8929 Other chronic pain: Secondary | ICD-10-CM | POA: Insufficient documentation

## 2021-08-24 DIAGNOSIS — M6281 Muscle weakness (generalized): Secondary | ICD-10-CM | POA: Insufficient documentation

## 2021-08-24 DIAGNOSIS — M5442 Lumbago with sciatica, left side: Secondary | ICD-10-CM | POA: Insufficient documentation

## 2021-08-24 DIAGNOSIS — M62838 Other muscle spasm: Secondary | ICD-10-CM | POA: Insufficient documentation

## 2021-08-24 NOTE — Patient Instructions (Signed)
Access Code: [email or web address] URL: [email or web address] Date: 08/24/2021 Prepared by: Glenetta Hew  Exercises Supine Lower Trunk Rotation - 1 x daily - 7 x weekly - 2 sets - 10 reps Supine Posterior Pelvic Tilt - 1 x daily - 7 x weekly - 2 sets - 10 reps - 3-5 sec hold Seated Hamstring Stretch - 1 x daily - 7 x weekly - 1 sets - 3 reps - 30-34 sec hold

## 2021-08-24 NOTE — Therapy (Signed)
[facility] [address]  [place], [place], [address] Phone: [phone]   Fax:  [phone]  Physical Therapy Treatment  Patient Details  Name: Erik Salazar MRN: [id number] Date of Birth: 29-Oct-1950 Referring Provider (PT): Kary Kos MD   Encounter Date: 08/24/2021   PT End of Session - 08/24/21 0855     Visit Number 1    Number of Visits 12    Date for PT Re-Evaluation 10/05/21    Authorization Type [employer] Medicare    Progress Note Due on Visit 10    PT Start Time 0805    PT Stop Time 0845    PT Time Calculation (min) 40 min    Activity Tolerance Patient tolerated treatment well    Behavior During Therapy [facility] for tasks assessed/performed             Past Medical History:  Diagnosis Date   Arthritis    Carpal tunnel syndrome on right 07/08/2017   Cervical radiculopathy at C6 35/00/9381   Right   Complication of anesthesia    Coronary artery disease    Enlarged prostate    H/O: knee surgery    15    History of open heart surgery    ASCENDING AORTIC ANEURYSM   Ischemic stroke of frontal lobe ([place]) 03/14/2017   Bilateral; post-redo CT surgery   PONV (postoperative nausea and vomiting)    only after CABG surgeries   Seizure disorder ([place]) 03/14/2017   Seizures ([place])    Status post knee surgery    DVT POST KNEE SURGERY    Past Surgical History:  Procedure Laterality Date   ANTERIOR CERVICAL DECOMP/DISCECTOMY FUSION N/A 05/04/2020   Procedure: Anterior Cervical Decompression/Discectomy Fuion Cervical three-four, Cervical four-five, Cervical five-six;  Surgeon: Kary Kos, MD;  Location: [place];  Service: Neurosurgery;  Laterality: N/A;   BALLOON DILATION N/A 06/23/2019   Procedure: BALLOON DILATION;  Surgeon: Otis Brace, MD;  [facility];  Service: Gastroenterology;  Laterality: N/A;   BENTALL PROCEDURE  01/04/2016   Bentall with 23 mm pericardial AVR; SVG-LAD, SVG-CX ([place])   BICEPS TENDON REPAIR Right    BIOPSY  06/23/2019   Procedure: BIOPSY;  Surgeon: Otis Brace, MD;  [facility]: [facility];  Service: Gastroenterology;;   CARDIAC SURGERY     ANUERSYM MAY 2017   Bentall procedure. Bioprosthetic aortic valve #23 mm bovine model #2700 TF ask, and 28 mm Gelweave woven vascular sinus of Valsalva graft   CARPAL TUNNEL RELEASE  08/2018   right hand    CORONARY ARTERY BYPASS GRAFT  01/04/2016   VG to LAD & VG to LCX   CORONARY ARTERY BYPASS GRAFT  03/13/2017   LIMA to LAD with steril abcess removal from dacron graft   ESOPHAGOGASTRODUODENOSCOPY (EGD) WITH PROPOFOL N/A 06/23/2019   Procedure: ESOPHAGOGASTRODUODENOSCOPY (EGD) WITH PROPOFOL;  Surgeon: Otis Brace, MD;  [facility];  Service: Gastroenterology;  Laterality: N/A;   FALSE ANEURYSM REPAIR  03/13/2017   redo sternotomy, sterile abscess removal from Dacron graft, omental flap around aorta, CABG: LIMA-LAD ([name], Dr. Mart Piggs)   [place]  2018   Of pericardium 2018   INSERTION OF MESH  10/14/2020   Procedure: INSERTION OF MESH;  Surgeon: Michael Boston, MD;  Location: [facility];  Service: General;;   knee surgeries     13 surgeries on knee done before knee replacement    [place]  LAPAROSCOPIC LYSIS OF ADHESIONS N/A 05/19/2021   Procedure: LYSIS OF ADHESIONS;  Surgeon: Michael Boston, MD;  Location: [place];  Service: General;  Laterality: N/A;  GEN & LOCAL   LYSIS OF ADHESION N/A 10/14/2020   Procedure: LYSIS OF ADHESION;  Surgeon: Michael Boston, MD;  Location: [place];  Service: General;  Laterality: N/A;   open heart surgery     03-13-2017   REPLACEMENT TOTAL KNEE  2015   ROTATOR CUFF REPAIR     TONSILLECTOMY     removed as a child.   VENTRAL HERNIA REPAIR N/A 10/14/2020   Procedure: LAPAROSCOPIC VENTRAL HERNIA REPAIR WITH TAP BLOCK BILATERAL;  Surgeon: Michael Boston, MD;  Location: [place];  Service: General;  Laterality: N/A;    VENTRAL HERNIA REPAIR N/A 05/19/2021   Procedure: LAPAROSCOPIC VENTRAL WALL HERNIA REPAIR;  Surgeon: Michael Boston, MD;  Location: [place];  Service: General;  Laterality: N/A;    There were no vitals filed for this visit.   Subjective Assessment - 08/24/21 0817     Subjective Pt. reports history of significant medical history and surgerys, all starting with injuries back in 1987 working in [place], has had 4 surgeries this year alone.  reports Sciatic type pain with prolonged sitting    Pertinent History PMH: chronic pain,  stroke, HTN, dysphagia, gerd, tremor, seizure, cervical stenosis,  surgery - aortic aneurism repair, hernia repair, aortic valve repair, omental flap graft to mediastinum    How long can you sit comfortably? 20-30 min    How long can you stand comfortably? no limitation    How long can you walk comfortably? no limitation, walks 5K per day    Currently in Pain? Yes    Pain Score 5     Pain Location Back    Pain Orientation Left;Lower    Pain Descriptors / Indicators Stabbing;Shooting    Pain Type Acute pain;Chronic pain    Pain Radiating Towards L buttock down back of thigh    Pain Onset More than a month ago    Pain Frequency Constant    Aggravating Factors  prolonged sitting, difficulty sleeping    Pain Relieving Factors moving around, tylenol takes the edge off    Effect of Pain on Daily Activities interfering with sleep                [facility] PT Assessment - 08/24/21 0001       Assessment   Medical Diagnosis M43.16 (ICD-10-CM) - Spondylolisthesis, lumbar region    Referring Provider (PT) Kary Kos MD    Onset Date/Surgical Date 06/28/21    Hand Dominance Right    Next MD Visit end of January    Prior Therapy not since 1986 for back, other PT for other reasons      Precautions   Precautions None      Restrictions   Weight Bearing Restrictions No      Balance Screen   Has the patient fallen in the past 6 months No    Has the patient had a  decrease in activity level because of a fear of falling?  No    Is the patient reluctant to leave their home because of a fear of falling?  No      Home Environment   Living Environment Private residence    Living Arrangements Spouse/significant other    Type of Home Other(Comment)   townhome   Home Access Level entry    [place] One level  Prior Function   Level of Independence Independent    Vocation Retired    Leisure got to gym, go out with friends      Cognition   Overall Cognitive Status Within Functional Limits for tasks assessed      Observation/Other Assessments   Observations Patient enters indepenendently without device, no apparent distress.    Focus on Therapeutic Outcomes (FOTO)  lumbar: 61% risk adjusted 49%, predicted outcome 64% after 12 visits      Sensation   Light Touch Appears Intact      Posture/Postural Control   Posture/Postural Control Postural limitations    Postural Limitations Decreased lumbar lordosis      ROM / Strength   AROM / PROM / Strength AROM;PROM;Strength      AROM   Overall AROM  Within functional limits for tasks performed    Overall AROM Comments good lumbar movement all directions, no increase in pain with movement.      PROM   Overall PROM  Deficits    Overall PROM Comments hip tightness bil, limited hip ER L>R.  Decreased L knee flexion in prone (history L TKR)      Strength   Overall Strength Within functional limits for tasks performed;Deficits    Overall Strength Comments 5/5 bil LE strength, decreased core strength (abdominal surgery with hernia repair)      Flexibility   Soft Tissue Assessment /Muscle Length yes    Hamstrings moderate tightness, L lacking 35 deg, R lacking 25 deg (measured in popliteal angle)    Quadriceps tightness bil, L > R but history L TKR, prone knee bend caused cramp L thigh    Piriformis tightness L      Palpation   Spinal mobility hypomobility in lumbar spine, tender L3-L5    SI  assessment  tenderness L SIJ    Palpation comment tenderness L QL, L glut med, L piriformis      Special Tests   Other special tests negative SLR, negative FABER                           [facility] Adult PT Treatment/Exercise - 08/24/21 0001       Self-Care   Self-Care Other Self-Care Comments    Other Self-Care Comments  see patient education      Exercises   Exercises Lumbar      Lumbar Exercises: Stretches   Active Hamstring Stretch Right;Left;1 rep;30 seconds    Active Hamstring Stretch Limitations seated hamstring stretch    Lower Trunk Rotation 5 reps    Lower Trunk Rotation Limitations pain free ROM, no hold    Pelvic Tilt 5 reps;5 seconds    Pelvic Tilt Limitations tactile and verbal cues needed, cues not to hold breathe                     PT Education - 08/24/21 0853     Education Details educated on findings, POC, and initial HEP.  Discussed return to gym, start with treadmill or bike, will work on proper form and bracing here for lifting light weights before trying at gym.  Educated on initial HEP for core strenghtening. Access Code: [email or web address]    Person(s) Educated Patient    Methods Explanation;Demonstration;Verbal cues;Handout    Comprehension Verbalized understanding;Returned demonstration;Need further instruction              PT Short Term Goals - 08/24/21 7673  PT SHORT TERM GOAL #1   Title Ind with initial HEP    Time 2    Period Weeks    Status New    Target Date 09/07/21               PT Long Term Goals - 08/24/21 0902       PT LONG TERM GOAL #1   Title Ind with progressed HEP for core strengthening to improve outcomes.    Time 6    Period Weeks    Status New    Target Date 10/05/21      PT LONG TERM GOAL #2   Title Pt. will report 75% improvement in sleep disruptions due to LBP    Time 6    Period Weeks    Status New    Target Date 10/05/21      PT LONG TERM GOAL #3   Title Pt. will report 50%  improve in LBP to improve QOL.    Baseline constant 5-6/10 L side LBP    Time 6    Period Weeks    Status New    Target Date 10/05/21      PT LONG TERM GOAL #4   Title Pt. will be able to transition to gym based exercise program incorporating lifting light weights safely.    Time 6    Period Weeks    Status New    Target Date 10/05/21                   Plan - 08/24/21 0855     Clinical Impression Statement Maninder Deboer is an active 71 year old male with significant medical history referred to PT for LBP following lumbar fusion (L5-S1) on 06/28/2021.  Per patient he has no restrictions on movements/lifting.  He primarily reports chronic L sided sciatic pain that has not resolved with surgery, although he has been told this can take up to a year, and decreased tolerance to exercise.  He has returned to walking 5K per day but is anxious to return to gym based exercise program.  He demonstrates abdominal weakness, tenderness throughout L side lumbar paraspinals and glutes, and tightness in L hamstings and hip external rotators.  He would benefit from skilled physical therapy to improve core strength, decrease pain, improve sleep, symmetry and allow return to gym based exercise program safely.    Personal Factors and Comorbidities Comorbidity 3+    Comorbidities chronic pain,  stroke, HTN, dysphagia, gerd, tremor, seizure, cervical stenosis,  surgery - aortic aneurism repair, hernia repair, aortic valve repair, omental flap graft to mediastinum    Examination-Activity Limitations Bed Mobility;Bend;Lift;Sit    Examination-Participation Restrictions Community Activity    Stability/Clinical Decision Making Evolving/Moderate complexity    Clinical Decision Making Moderate    Rehab Potential Excellent    PT Frequency 2x / week    PT Duration 6 weeks    PT Treatment/Interventions ADLs/Self Care Home Management;Cryotherapy;Electrical Stimulation;Moist Heat;Functional mobility  training;Therapeutic activities;Therapeutic exercise;Balance training;Neuromuscular re-education;Patient/family education;Manual techniques;Passive range of motion;Dry needling;Taping;Spinal Manipulations;Joint Manipulations    PT Next Visit Plan review and progress HEP for neutral spine exercises for core strengthening, manual therapy and modalities PRN, consider dry needling to L piriformis    PT Home Exercise Plan [email or web address]             Patient will benefit from skilled therapeutic intervention in order to improve the following deficits and impairments:  Decreased activity tolerance, Decreased range of motion, Decreased strength,  Hypomobility, Increased fascial restricitons, Decreased mobility, Increased muscle spasms, Impaired flexibility, Postural dysfunction, Pain  Visit Diagnosis: Chronic left-sided low back pain with left-sided sciatica  Muscle weakness (generalized)  Other muscle spasm     Problem List Patient Active Problem List   Diagnosis Date Noted   Spondylolisthesis at L4-L5 level 06/28/2021   S/P repair of ventral hernia 05/19/2021   S/P repair of recurrent ventral hernia 05/19/2021   Benign prostatic hyperplasia without lower urinary tract symptoms 10/14/2020   Chronic pain 10/14/2020   ED (erectile dysfunction) of organic origin 10/14/2020   Esophageal dysphagia 10/14/2020   Gastroesophageal reflux disease 10/14/2020   History of aortic valve replacement with bioprosthetic valve 10/14/2020   Hx of aortic aneurysm repair 10/14/2020   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   Thoracic aortic aneurysm without rupture 10/14/2020   Recurrent incisional hernia 10/14/2020   Spinal stenosis in cervical region 05/04/2020   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   Cervical radiculopathy at C6 11/14/2017   Carpal tunnel syndrome on right 07/08/2017   Tremor, essential 07/08/2017   Ischemic stroke of frontal lobe ([place]) 04/05/2017   Pain in right hand 04/05/2017   History of  omental flap graft to mediastinum 03/27/2017   Seizure ([place]) 03/14/2017   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/13/2017   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

## 2021-08-30 ENCOUNTER — Other Ambulatory Visit: Payer: Self-pay

## 2021-08-30 ENCOUNTER — Ambulatory Visit: Payer: Medicare HMO

## 2021-08-30 DIAGNOSIS — M62838 Other muscle spasm: Secondary | ICD-10-CM

## 2021-08-30 DIAGNOSIS — M6281 Muscle weakness (generalized): Secondary | ICD-10-CM

## 2021-08-30 DIAGNOSIS — M5442 Lumbago with sciatica, left side: Secondary | ICD-10-CM | POA: Diagnosis not present

## 2021-08-30 DIAGNOSIS — G8929 Other chronic pain: Secondary | ICD-10-CM

## 2021-08-30 NOTE — Therapy (Signed)
[facility] [address]  [place], [place], [address] Phone: [phone]   Fax:  [phone]  Physical Therapy Treatment  Patient Details  Name: Erik Salazar MRN: [id number] Date of Birth: 1950-12-29 Referring Provider (PT): Kary Kos MD   Encounter Date: 08/30/2021   PT End of Session - 08/30/21 0837     Visit Number 2    Number of Visits 12    Date for PT Re-Evaluation 10/05/21    Authorization Type [employer]na Medicare    Progress Note Due on Visit 10    PT Start Time 0802    PT Stop Time 0841    PT Time Calculation (min) 39 min    Activity Tolerance Patient tolerated treatment well    Behavior During Therapy [facility] for tasks assessed/performed             Past Medical History:  Diagnosis Date   Arthritis    Carpal tunnel syndrome on right 07/08/2017   Cervical radiculopathy at C6 97/41/6384   Right   Complication of anesthesia    Coronary artery disease    Enlarged prostate    H/O: knee surgery    15    History of open heart surgery    ASCENDING AORTIC ANEURYSM   Ischemic stroke of frontal lobe ([place]) 03/14/2017   Bilateral; post-redo CT surgery   PONV (postoperative nausea and vomiting)    only after CABG surgeries   Seizure disorder ([place]) 03/14/2017   Seizures ([place])    Status post knee surgery    DVT POST KNEE SURGERY    Past Surgical History:  Procedure Laterality Date   ANTERIOR CERVICAL DECOMP/DISCECTOMY FUSION N/A 05/04/2020   Procedure: Anterior Cervical Decompression/Discectomy Fuion Cervical three-four, Cervical four-five, Cervical five-six;  Surgeon: Kary Kos, MD;  Location: [place];  Service: Neurosurgery;  Laterality: N/A;   BALLOON DILATION N/A 06/23/2019   Procedure: BALLOON DILATION;  Surgeon: Otis Brace, MD;  [facility];  Service: Gastroenterology;  Laterality: N/A;   BENTALL PROCEDURE  01/04/2016   Bentall with 23 mm pericardial AVR; SVG-LAD, SVG-CX ([place])   BICEPS TENDON REPAIR Right    BIOPSY  06/23/2019   Procedure: BIOPSY;  Surgeon: Otis Brace, MD;  [facility];  Service: Gastroenterology;;   CARDIAC SURGERY     ANUERSYM MAY 2017   Bentall procedure. Bioprosthetic aortic valve #23 mm bovine model #2700 TF ask, and 28 mm Gelweave woven vascular sinus of Valsalva graft   CARPAL TUNNEL RELEASE  08/2018   right hand    CORONARY ARTERY BYPASS GRAFT  01/04/2016   VG to LAD & VG to LCX   CORONARY ARTERY BYPASS GRAFT  03/13/2017   LIMA to LAD with steril abcess removal from dacron graft   ESOPHAGOGASTRODUODENOSCOPY (EGD) WITH PROPOFOL N/A 06/23/2019   Procedure: ESOPHAGOGASTRODUODENOSCOPY (EGD) WITH PROPOFOL;  Surgeon: Otis Brace, MD;  [facility];  Service: Gastroenterology;  Laterality: N/A;   FALSE ANEURYSM REPAIR  03/13/2017   redo sternotomy, sterile abscess removal from Dacron graft, omental flap around aorta, CABG: LIMA-LAD ([name], Dr. Mart Piggs)   [place]  2018   Of pericardium 2018   INSERTION OF MESH  10/14/2020   Procedure: INSERTION OF MESH;  Surgeon: Michael Boston, MD;  Location: [place];  Service: General;;   knee surgeries     13 surgeries on knee done before knee replacement    [place]  LAPAROSCOPIC LYSIS OF ADHESIONS N/A 05/19/2021   Procedure: LYSIS OF ADHESIONS;  Surgeon: Michael Boston, MD;  Location: [place];  Service: General;  Laterality: N/A;  GEN & LOCAL   LYSIS OF ADHESION N/A 10/14/2020   Procedure: LYSIS OF ADHESION;  Surgeon: Michael Boston, MD;  Location: [place];  Service: General;  Laterality: N/A;   open heart surgery     03-13-2017   REPLACEMENT TOTAL KNEE  2015   ROTATOR CUFF REPAIR     TONSILLECTOMY     removed as a child.   VENTRAL HERNIA REPAIR N/A 10/14/2020   Procedure: LAPAROSCOPIC VENTRAL HERNIA REPAIR WITH TAP BLOCK BILATERAL;  Surgeon: Michael Boston, MD;  Location: [place];  Service: General;  Laterality: N/A;    VENTRAL HERNIA REPAIR N/A 05/19/2021   Procedure: LAPAROSCOPIC VENTRAL WALL HERNIA REPAIR;  Surgeon: Michael Boston, MD;  Location: [place];  Service: General;  Laterality: N/A;    There were no vitals filed for this visit.   Subjective Assessment - 08/30/21 0803     Subjective Pt reports that he woke up early this morning and walked about 4,000 steps. Also did the 3 exercises he was given the last time he came.    Pertinent History PMH: chronic pain,  stroke, HTN, dysphagia, gerd, tremor, seizure, cervical stenosis,  surgery - aortic aneurism repair, hernia repair, aortic valve repair, omental flap graft to mediastinum    Diagnostic tests recent back x-rays demonstrate excellent alighment and corporation of the implants    Patient Stated Goals decrease pain, return to gym    Currently in Pain? Yes    Pain Score 5     Pain Location Back    Pain Orientation Left;Lower    Pain Descriptors / Indicators Stabbing;Shooting    Pain Type Acute pain;Chronic pain                               [facility] Adult PT Treatment/Exercise - 08/30/21 0001       Lumbar Exercises: Stretches   Lower Trunk Rotation Limitations 10 reps AROM    Pelvic Tilt 10 reps;5 seconds    Standing Extension 10 reps    Standing Extension Limitations 3 sec holds    Figure 4 Stretch 2 reps;30 seconds    Figure 4 Stretch Limitations supine      Lumbar Exercises: Aerobic   Recumbent Bike L2x18min      Lumbar Exercises: Standing   Shoulder Extension Strengthening;Both;20 reps;Theraband    Theraband Level (Shoulder Extension) Level 2 (Red)    Other Standing Lumbar Exercises lat pull downs/row 15 reps with RTB      Lumbar Exercises: Supine   Bridge 5 seconds;20 reps    Straight Leg Raise 10 reps;5 seconds      Lumbar Exercises: Sidelying   Clam Both;10 reps;5 seconds                     PT Education - 08/30/21 0836     Education Details HEP progression    Person(s) Educated Patient     Methods Explanation;Demonstration;Verbal cues    Comprehension Verbalized understanding;Returned demonstration              PT Short Term Goals - 08/30/21 0851       PT SHORT TERM GOAL #1   Title Ind with initial HEP    Time 2    Period Weeks    Status On-going  Target Date 09/07/21               PT Long Term Goals - 08/30/21 0851       PT LONG TERM GOAL #1   Title Ind with progressed HEP for core strengthening to improve outcomes.    Time 6    Period Weeks    Status On-going    Target Date 10/05/21      PT LONG TERM GOAL #2   Title Pt. will report 75% improvement in sleep disruptions due to LBP    Time 6    Period Weeks    Status On-going    Target Date 10/05/21      PT LONG TERM GOAL #3   Title Pt. will report 50% improve in LBP to improve QOL.    Baseline constant 5-6/10 L side LBP    Time 6    Period Weeks    Status On-going    Target Date 10/05/21      PT LONG TERM GOAL #4   Title Pt. will be able to transition to gym based exercise program incorporating lifting light weights safely.    Time 6    Period Weeks    Status On-going    Target Date 10/05/21                   Plan - 08/30/21 0844     Clinical Impression Statement Pt showed a good demonstration of the initial home exercises. Progressed HEP with glute strengthening and stretching. The L glute med showed to be more tight than the R in supine figure 4 position. Some cues needed to keep arms straight with lat pull downs. Pt requested to start with MHP with warm up next session.    Personal Factors and Comorbidities Comorbidity 3+    Comorbidities chronic pain,  stroke, HTN, dysphagia, gerd, tremor, seizure, cervical stenosis,  surgery - aortic aneurism repair, hernia repair, aortic valve repair, omental flap graft to mediastinum    PT Frequency 2x / week    PT Duration 6 weeks    PT Treatment/Interventions ADLs/Self Care Home Management;Cryotherapy;Electrical Stimulation;Moist  Heat;Functional mobility training;Therapeutic activities;Therapeutic exercise;Balance training;Neuromuscular re-education;Patient/family education;Manual techniques;Passive range of motion;Dry needling;Taping;Spinal Manipulations;Joint Manipulations    PT Next Visit Plan review and progress HEP for neutral spine exercises for core strengthening, manual therapy and modalities PRN, consider dry needling to L piriformis    PT Home Exercise Plan [email or web address]    Consulted and Agree with Plan of Care Patient             Patient will benefit from skilled therapeutic intervention in order to improve the following deficits and impairments:  Decreased activity tolerance, Decreased range of motion, Decreased strength, Hypomobility, Increased fascial restricitons, Decreased mobility, Increased muscle spasms, Impaired flexibility, Postural dysfunction, Pain  Visit Diagnosis: Chronic left-sided low back pain with left-sided sciatica  Muscle weakness (generalized)  Other muscle spasm     Problem List Patient Active Problem List   Diagnosis Date Noted   Spondylolisthesis at L4-L5 level 06/28/2021   S/P repair of ventral hernia 05/19/2021   S/P repair of recurrent ventral hernia 05/19/2021   Benign prostatic hyperplasia without lower urinary tract symptoms 10/14/2020   Chronic pain 10/14/2020   ED (erectile dysfunction) of organic origin 10/14/2020   Esophageal dysphagia 10/14/2020   Gastroesophageal reflux disease 10/14/2020   History of aortic valve replacement with bioprosthetic valve 10/14/2020   Hx of aortic aneurysm repair 10/14/2020   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   Thoracic aortic aneurysm without rupture 10/14/2020   Recurrent incisional hernia 10/14/2020   Spinal stenosis in cervical region 05/04/2020   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   Cervical radiculopathy at C6 11/14/2017   Carpal tunnel syndrome on right 07/08/2017   Tremor, essential 07/08/2017   Ischemic stroke of  frontal lobe ([place]) 04/05/2017   Pain in right hand 04/05/2017   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

## 2021-09-01 ENCOUNTER — Ambulatory Visit
Admission: RE | Admit: 2021-09-01 | Discharge: 2021-09-01 | Disposition: A | Discharge status: Home / Self Care | Payer: Medicare HMO | Source: Ambulatory Visit | Attending: Cardiothoracic Surgery | Admitting: Cardiothoracic Surgery

## 2021-09-01 ENCOUNTER — Other Ambulatory Visit: Payer: Self-pay

## 2021-09-01 ENCOUNTER — Ambulatory Visit: Payer: Medicare HMO | Admitting: Physical Therapy

## 2021-09-01 ENCOUNTER — Encounter: Payer: Self-pay | Admitting: Physical Therapy

## 2021-09-01 DIAGNOSIS — M5442 Lumbago with sciatica, left side: Secondary | ICD-10-CM

## 2021-09-01 DIAGNOSIS — I7121 Aneurysm of the ascending aorta, without rupture: Secondary | ICD-10-CM

## 2021-09-01 DIAGNOSIS — M6281 Muscle weakness (generalized): Secondary | ICD-10-CM

## 2021-09-01 DIAGNOSIS — M62838 Other muscle spasm: Secondary | ICD-10-CM

## 2021-09-01 DIAGNOSIS — G8929 Other chronic pain: Secondary | ICD-10-CM

## 2021-09-01 MED ORDER — IOPAMIDOL (ISOVUE-370) INJECTION 76%
75.0000 mL | Freq: Once | INTRAVENOUS | Status: AC | PRN
  Administered 2021-09-01: 75 mL via INTRAVENOUS

## 2021-09-01 NOTE — Therapy (Signed)
[facility] [address]  [place], [place], [address] Phone: [phone]   Fax:  [phone]  Physical Therapy Treatment  Patient Details  Name: Erik Salazar MRN: [id number] Date of Birth: 12-21-1950 Referring Provider (PT): Kary Kos MD   Encounter Date: 09/01/2021   PT End of Session - 09/01/21 0804     Visit Number 3    Number of Visits 12    Date for PT Re-Evaluation 10/05/21    Authorization Type [employer]    Progress Note Due on Visit 10    PT Start Time 0800    PT Stop Time 0843    PT Time Calculation (min) 43 min    Activity Tolerance Patient tolerated treatment well    Behavior During Therapy [facility] for tasks assessed/performed             Past Medical History:  Diagnosis Date   Arthritis    Carpal tunnel syndrome on right 07/08/2017   Cervical radiculopathy at C6 78/58/8502   Right   Complication of anesthesia    Coronary artery disease    Enlarged prostate    H/O: knee surgery    15    History of open heart surgery    ASCENDING AORTIC ANEURYSM   Ischemic stroke of frontal lobe ([place]) 03/14/2017   Bilateral; post-redo CT surgery   PONV (postoperative nausea and vomiting)    only after CABG surgeries   Seizure disorder ([place]) 03/14/2017   Seizures ([place])    Status post knee surgery    DVT POST KNEE SURGERY    Past Surgical History:  Procedure Laterality Date   ANTERIOR CERVICAL DECOMP/DISCECTOMY FUSION N/A 05/04/2020   Procedure: Anterior Cervical Decompression/Discectomy Fuion Cervical three-four, Cervical four-five, Cervical five-six;  Surgeon: Kary Kos, MD;  Location: [facility];  Service: Neurosurgery;  Laterality: N/A;   BALLOON DILATION N/A 06/23/2019   Procedure: BALLOON DILATION;  Surgeon: Otis Brace, MD;  [facility];  Service: Gastroenterology;  Laterality: N/A;   BENTALL PROCEDURE  01/04/2016   Bentall with 23 mm pericardial AVR; SVG-LAD, SVG-CX ([place])   BICEPS TENDON REPAIR Right    BIOPSY  06/23/2019   Procedure: BIOPSY;  Surgeon: Otis Brace, MD;  [facility];  Service: Gastroenterology;;   CARDIAC SURGERY     ANUERSYM MAY 2017   Bentall procedure. Bioprosthetic aortic valve #23 mm bovine model #2700 TF ask, and 28 mm Gelweave woven vascular sinus of Valsalva graft   CARPAL TUNNEL RELEASE  08/2018   right hand    CORONARY ARTERY BYPASS GRAFT  01/04/2016   VG to LAD & VG to LCX   CORONARY ARTERY BYPASS GRAFT  03/13/2017   LIMA to LAD with steril abcess removal from dacron graft   ESOPHAGOGASTRODUODENOSCOPY (EGD) WITH PROPOFOL N/A 06/23/2019   Procedure: ESOPHAGOGASTRODUODENOSCOPY (EGD) WITH PROPOFOL;  Surgeon: Otis Brace, MD;  [facility];  Service: Gastroenterology;  Laterality: N/A;   FALSE ANEURYSM REPAIR  03/13/2017   redo sternotomy, sterile abscess removal from Dacron graft, omental flap around aorta, CABG: LIMA-LAD ([name], Dr. Mart Piggs)   [place]  2018   Of pericardium 2018   INSERTION OF MESH  10/14/2020   Procedure: INSERTION OF MESH;  Surgeon: Michael Boston, MD;  Location: [facility];  Service: General;;   knee surgeries     13 surgeries on knee done before knee replacement    [place]  LAPAROSCOPIC LYSIS OF ADHESIONS N/A 05/19/2021   Procedure: LYSIS OF ADHESIONS;  Surgeon: Michael Boston, MD;  Location: [facility];  Service: General;  Laterality: N/A;  GEN & LOCAL   LYSIS OF ADHESION N/A 10/14/2020   Procedure: LYSIS OF ADHESION;  Surgeon: Michael Boston, MD;  Location: [facility];  Service: General;  Laterality: N/A;   open heart surgery     03-13-2017   REPLACEMENT TOTAL KNEE  2015   ROTATOR CUFF REPAIR     TONSILLECTOMY     removed as a child.   VENTRAL HERNIA REPAIR N/A 10/14/2020   Procedure: LAPAROSCOPIC VENTRAL HERNIA REPAIR WITH TAP BLOCK BILATERAL;  Surgeon: Michael Boston, MD;  Location: [facility];  Service: General;  Laterality: N/A;    VENTRAL HERNIA REPAIR N/A 05/19/2021   Procedure: LAPAROSCOPIC VENTRAL WALL HERNIA REPAIR;  Surgeon: Michael Boston, MD;  Location: [facility];  Service: General;  Laterality: N/A;    There were no vitals filed for this visit.   Subjective Assessment - 09/01/21 0803     Subjective Pt reports that he already walked his 4,000 steps this morning.  Now that he has cooled down feels stiff and would like a heat pack.   Has been performing HEP.    Pertinent History PMH: chronic pain,  stroke, HTN, dysphagia, gerd, tremor, seizure, cervical stenosis,  surgery - aortic aneurism repair, hernia repair, aortic valve repair, omental flap graft to mediastinum    Diagnostic tests recent back x-rays demonstrate excellent alighment and corporation of the implants    Patient Stated Goals decrease pain, return to gym    Currently in Pain? Yes    Pain Score 5     Pain Location Back                               [facility] Adult PT Treatment/Exercise - 09/01/21 0001       Exercises   Exercises Lumbar      Lumbar Exercises: Stretches   Active Hamstring Stretch Right;Left;5 reps    Passive Hamstring Stretch Right;Left;2 reps;20 seconds    Passive Hamstring Stretch Limitations also crossing for ITBand stretch    Pelvic Tilt 10 reps      Lumbar Exercises: Aerobic   Nustep L6 x 6 min   with MHP     Lumbar Exercises: Supine   Clam 20 reps    Clam Limitations 2 x 10 with GTB, cues for TrA contraction    Bridge 15 reps    Bridge Limitations cues for TrA contraction and breathing    Bridge with [employer] 20 reps    Bridge with [employer] Limitations 2 x 10, cues for TrA contraction and breathing    Other Supine Lumbar Exercises supine pull down with GTB and TrA contraction 2 x 10      Modalities   Modalities Moist Heat      Moist Heat Therapy   Number Minutes Moist Heat 30 Minutes   during Nustep and supine exercises, by request     Manual Therapy   Manual Therapy Soft tissue  mobilization;Myofascial release    Manual therapy comments to low back/L glutes to decrease muscle spasm and pain,    Soft tissue mobilization STM to lumbar paraspinals, IASTM to L glutes    Myofascial Release MFR to L QL  PT Short Term Goals - 08/30/21 0851       PT SHORT TERM GOAL #1   Title Ind with initial HEP    Time 2    Period Weeks    Status On-going    Target Date 09/07/21               PT Long Term Goals - 08/30/21 0851       PT LONG TERM GOAL #1   Title Ind with progressed HEP for core strengthening to improve outcomes.    Time 6    Period Weeks    Status On-going    Target Date 10/05/21      PT LONG TERM GOAL #2   Title Pt. will report 75% improvement in sleep disruptions due to LBP    Time 6    Period Weeks    Status On-going    Target Date 10/05/21      PT LONG TERM GOAL #3   Title Pt. will report 50% improve in LBP to improve QOL.    Baseline constant 5-6/10 L side LBP    Time 6    Period Weeks    Status On-going    Target Date 10/05/21      PT LONG TERM GOAL #4   Title Pt. will be able to transition to gym based exercise program incorporating lifting light weights safely.    Time 6    Period Weeks    Status On-going    Target Date 10/05/21                   Plan - 09/01/21 0849     Clinical Impression Statement Pt. demonstrates good compliance and tolerance of HEP.  He tolerated all exercises well, although had difficulty with pelvic tilts due to lumbopelvic tightness.  Discussed dry needling today, but after discussion of surgery and hardware, inappropriate to dry needle lumbar paraspinals, and did not have palpalble trigger points in piriformis/glutes today, so deferred this area as well.  Reported decreased pain/tightness following manual therapy.  He would benefit from continued skilled therapy.    Personal Factors and Comorbidities Comorbidity 3+    Comorbidities chronic pain,  stroke, HTN,  dysphagia, gerd, tremor, seizure, cervical stenosis,  surgery - aortic aneurism repair, hernia repair, aortic valve repair, omental flap graft to mediastinum    PT Frequency 2x / week    PT Duration 6 weeks    PT Treatment/Interventions ADLs/Self Care Home Management;Cryotherapy;Electrical Stimulation;Moist Heat;Functional mobility training;Therapeutic activities;Therapeutic exercise;Balance training;Neuromuscular re-education;Patient/family education;Manual techniques;Passive range of motion;Dry needling;Taping;Spinal Manipulations;Joint Manipulations    PT Next Visit Plan continue to progress neutral spine strengthening and stretches as tolerated, manual therapy and modalities PRN    PT Home Exercise Plan [email or web address]    Consulted and Agree with Plan of Care Patient             Patient will benefit from skilled therapeutic intervention in order to improve the following deficits and impairments:  Decreased activity tolerance, Decreased range of motion, Decreased strength, Hypomobility, Increased fascial restricitons, Decreased mobility, Increased muscle spasms, Impaired flexibility, Postural dysfunction, Pain  Visit Diagnosis: Chronic left-sided low back pain with left-sided sciatica  Muscle weakness (generalized)  Other muscle spasm     Problem List Patient Active Problem List   Diagnosis Date Noted   Spondylolisthesis at L4-L5 level 06/28/2021   S/P repair of ventral hernia 05/19/2021   S/P repair of recurrent ventral hernia 05/19/2021   Benign prostatic hyperplasia without  lower urinary tract symptoms 10/14/2020   Chronic pain 10/14/2020   ED (erectile dysfunction) of organic origin 10/14/2020   Esophageal dysphagia 10/14/2020   Gastroesophageal reflux disease 10/14/2020   History of aortic valve replacement with bioprosthetic valve 10/14/2020   Hx of aortic aneurysm repair 10/14/2020   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[place]) 10/14/2020   Thoracic aortic aneurysm without rupture  10/14/2020   Recurrent incisional hernia 10/14/2020   Spinal stenosis in cervical region 05/04/2020   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   Cervical radiculopathy at C6 11/14/2017   Carpal tunnel syndrome on right 07/08/2017   Tremor, essential 07/08/2017   Ischemic stroke of frontal lobe ([place]) 04/05/2017   Pain in right hand 04/05/2017   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  [place], [place], [address] Phone: [phone]   Fax:  [phone]  Name: Zhi Geier MRN: [id number] Date of Birth: Jan 08, 1951

## 2021-09-04 ENCOUNTER — Ambulatory Visit (INDEPENDENT_AMBULATORY_CARE_PROVIDER_SITE_OTHER): Payer: Medicare HMO | Admitting: Cardiothoracic Surgery

## 2021-09-04 ENCOUNTER — Ambulatory Visit: Payer: Medicare HMO | Admitting: Physical Therapy

## 2021-09-04 ENCOUNTER — Other Ambulatory Visit: Payer: Self-pay

## 2021-09-04 ENCOUNTER — Ambulatory Visit: Payer: Medicare HMO | Admitting: Cardiothoracic Surgery

## 2021-09-04 ENCOUNTER — Encounter: Payer: Self-pay | Admitting: Cardiothoracic Surgery

## 2021-09-04 VITALS — BP 158/88 | HR 83 | Resp 20 | Ht 69.0 in | Wt 199.0 lb

## 2021-09-04 DIAGNOSIS — I700 Atherosclerosis of aorta: Secondary | ICD-10-CM | POA: Diagnosis not present

## 2021-09-04 DIAGNOSIS — I719 Aortic aneurysm of unspecified site, without rupture: Secondary | ICD-10-CM | POA: Diagnosis not present

## 2021-09-04 NOTE — Progress Notes (Signed)
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  HPI: 71 year old hypertensive male at a Bentall procedure performed in [place] approximately 8 to 10 years ago. 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 Ysidro Evert.  The graft was intact but there was a large sterile phlegmon around the graft.  The graft was intact and an omental flap was placed in the space and a mammary bypass graft to the LAD was performed since the vein graft placed at the original Bentall was occluded.  His last CTA was July 2021 which showed the aortic root repair intact with some atherosclerotic plaque of the ascending aorta and a diameter of the ascending aorta less than 4 cm.  He has had no significant chest pain since that time.  He has undergone multiple procedures however including neurosurgical lumbar spine instrumentation and neurosurgical cervical spine instrumentation as well as repair of a abdominal hernia. He is compliant with his blood pressure medication and checks his blood pressure frequently which stays always less than 140.  He is not a smoker and tries to keep his weight under control and walks the equivalent of 70,000 steps per day. Past Medical History:  Diagnosis Date   Arthritis    Carpal tunnel syndrome on right 07/08/2017   Cervical radiculopathy at C6 05/26/1218   Right   Complication of anesthesia    Coronary artery disease    Enlarged prostate    H/O: knee surgery    15    History of open heart surgery    ASCENDING AORTIC ANEURYSM   Ischemic stroke of frontal lobe ([place]) 03/14/2017   Bilateral; post-redo CT surgery   PONV (postoperative nausea  and vomiting)    only after CABG surgeries   Seizure disorder ([place]) 03/14/2017   Seizures ([place])    Status post knee surgery    DVT POST KNEE SURGERY    Past Surgical History:  Procedure Laterality Date   ANTERIOR CERVICAL DECOMP/DISCECTOMY FUSION N/A 05/04/2020   Procedure: Anterior Cervical Decompression/Discectomy Fuion Cervical three-four, Cervical four-five, Cervical five-six;  Surgeon: Kary Kos, MD;  Location: [facility];  Service: Neurosurgery;  Laterality: N/A;   BALLOON DILATION N/A 06/23/2019   Procedure: BALLOON DILATION;  Surgeon: Otis Brace, MD;  [facility];  Service: Gastroenterology;  Laterality: N/A;   BENTALL PROCEDURE  01/04/2016   Bentall with 23 mm pericardial AVR; SVG-LAD, SVG-CX ([place])   BICEPS TENDON REPAIR Right    BIOPSY  06/23/2019   Procedure: BIOPSY;  Surgeon: Otis Brace, MD;  [facility];  Service: Gastroenterology;;   CARDIAC SURGERY     ANUERSYM MAY 2017   Bentall procedure. Bioprosthetic aortic valve #23 mm bovine model #2700 TF ask, and 28 mm Gelweave woven vascular sinus of Valsalva graft   CARPAL TUNNEL RELEASE  08/2018   right hand    CORONARY ARTERY BYPASS GRAFT  01/04/2016   VG to LAD & VG to LCX   CORONARY ARTERY BYPASS GRAFT  03/13/2017   LIMA to LAD with steril abcess removal from dacron graft   ESOPHAGOGASTRODUODENOSCOPY (EGD) WITH PROPOFOL N/A 06/23/2019   Procedure: ESOPHAGOGASTRODUODENOSCOPY (EGD) WITH PROPOFOL;  Surgeon: Otis Brace, MD;  Location: [facility];  Service: Gastroenterology;  Laterality: N/A;   FALSE ANEURYSM REPAIR  03/13/2017   redo sternotomy, sterile abscess removal from Dacron graft, omental flap around aorta, CABG: LIMA-LAD ([name], Dr. Mart Piggs)   [place]  2018   Of pericardium 2018   INSERTION OF MESH  10/14/2020   Procedure: INSERTION OF MESH;  Surgeon: Michael Boston, MD;  Location: [facility];  Service: General;;   knee surgeries      13 surgeries on knee done before knee replacement    [place] N/A 05/19/2021   Procedure: LYSIS OF ADHESIONS;  Surgeon: Michael Boston, MD;  Location: [facility];  Service: General;  Laterality: N/A;  GEN & LOCAL   LYSIS OF ADHESION N/A 10/14/2020   Procedure: LYSIS OF ADHESION;  Surgeon: Michael Boston, MD;  Location: [facility];  Service: General;  Laterality: N/A;   open heart surgery     03-13-2017   REPLACEMENT TOTAL KNEE  2015   ROTATOR CUFF REPAIR     TONSILLECTOMY     removed as a child.   VENTRAL HERNIA REPAIR N/A 10/14/2020   Procedure: LAPAROSCOPIC VENTRAL HERNIA REPAIR WITH TAP BLOCK BILATERAL;  Surgeon: Michael Boston, MD;  Location: [facility];  Service: General;  Laterality: N/A;   VENTRAL HERNIA REPAIR N/A 05/19/2021   Procedure: LAPAROSCOPIC VENTRAL WALL HERNIA REPAIR;  Surgeon: Michael Boston, MD;  Location: [facility];  Service: General;  Laterality: N/A;    Family History  Problem Relation Age of Onset   Arthritis Mother    Aneurysm Mother        brain aneurysm for mother.    Arthritis Father     Social History Social History   Tobacco Use   Smoking status: Former    Types: Cigars   Smokeless tobacco: Never  Vaping Use   Vaping Use: Never used  Substance Use Topics   Alcohol use: Yes    Alcohol/week: 1.0 standard drink    Types: 1 Cans of beer per week    Comment: occassionally   Drug use: No    Current Outpatient Medications  Medication Sig Dispense Refill   ascorbic acid (VITAMIN C) 500 MG tablet Take 500 mg by mouth daily.     aspirin EC 81 MG tablet Take 81 mg by mouth daily. Swallow whole.     Cholecalciferol (VITAMIN D3) 50 MCG (2000 UT) TABS Take 2,000 Units by mouth daily.     levETIRAcetam (KEPPRA) 1000 MG tablet Take 1 tablet (1,000 mg total) by mouth 2 (two) times daily. 180 tablet 4   metoprolol succinate (TOPROL-XL) 25 MG 24 hr tablet Take 0.5 tablets (12.5 mg total) by mouth daily. With or immediately following  a meal. (Patient taking differently: Take 12.5 mg by mouth at bedtime. With or immediately following a meal.) 45 tablet 3   pravastatin (PRAVACHOL) 40 MG tablet Take 1 tablet (40 mg total) by mouth every evening. 90 tablet 3   pyridOXINE (VITAMIN B-6) 100 MG tablet Take 100 mg by mouth daily.     tamsulosin (FLOMAX) 0.4 MG CAPS capsule Take 0.4 mg by mouth every evening.      No current facility-administered medications for this visit.    Allergies  Allergen Reactions   Oxycodone Hcl Other (See Comments)    DELUSIONAL   Zetia [Ezetimibe] Other (See Comments)    Leg cramps   Augmentin [Amoxicillin-Pot Clavulanate] Nausea Only  Dizzy   Chlorhexidine Rash   Elemental Sulfur Rash    Review of Systems Weight up 10 pounds No fever No symptoms of COVID pneumonia or viral syndrome No ankle edema No chest pain No syncope  BP (!) 158/88 (BP Location: Left Arm, Patient Position: Sitting, Cuff Size: Normal)    Pulse 83    Resp 20    Ht 5\' 9"  (1.753 m)    Wt 199 lb (90.3 kg)    SpO2 95% Comment: RA   BMI 29.39 kg/m  Physical Exam      Exam    General- alert and comfortable    Neck- no JVD, no cervical adenopathy palpable, no carotid bruit   Lungs- clear without rales, wheezes.  Well-healed sternal incision.   Cor- regular rate and rhythm, no murmur , gallop   Abdomen- soft, non-tender with a well-healed epigastric hernia repair scar.   Extremities - warm, non-tender, minimal edema   Neuro- oriented, appropriate, no focal weakness   Diagnostic Tests: CT images personally reviewed with the patient.  The ascending aortic diameter remains less than 4 cm.  There is atherosclerotic plaque of the native aorta above the distal anastomosis.  One of the plaques appears to have slight ulceration and this was interpreted by radiology as a pseudoaneurysm.  Comparing the current images to the image from July 2021 shows that the plaque formation ascending aorta is more pronounced and I presume that  the 6 mm bump of contrast in the aorta is due to ulcerated plaque disease.  In any event he will need to be followed up with a another CT scan in 6 months which we will arrange.  Impression: Status post Bentall procedure in [place] 8 to 10 years ago Redo sternotomy for expiration of the root repair with placement of a omental flap for sterile phlegmon at [facility] 2018 Atherosclerotic plaque of the ascending aorta above the distal anastomosis for the Bentall with a small 6 to 8 mm ball probably representing ulcerated plaque.  Plan: Continue heart healthy diet.  Continue blood pressure control and monitoring.  Continue daily walking aerobic exercise.  No heavy weightlifting.  Screening CT scan in 6 months. The changes CT scan were  explained to the patient and wife and he is reassured that this does not appear to be a dangerous situation but will need to be followed.  Dahlia Byes, MD [facility] [phone]

## 2021-09-07 ENCOUNTER — Ambulatory Visit: Payer: Medicare HMO

## 2021-09-07 DIAGNOSIS — M5442 Lumbago with sciatica, left side: Secondary | ICD-10-CM | POA: Diagnosis not present

## 2021-09-07 DIAGNOSIS — G8929 Other chronic pain: Secondary | ICD-10-CM

## 2021-09-07 DIAGNOSIS — M62838 Other muscle spasm: Secondary | ICD-10-CM

## 2021-09-07 DIAGNOSIS — M6281 Muscle weakness (generalized): Secondary | ICD-10-CM

## 2021-09-07 NOTE — Therapy (Signed)
[facility] [name]  [place], [place], [address] Phone: [phone]   Fax:  [phone]  Physical Therapy Treatment  Patient Details  Name: Erik Salazar MRN: [id number] Date of Birth: 10/30/1950 Referring Provider (PT): Kary Kos MD   Encounter Date: 09/07/2021   PT End of Session - 09/07/21 0936     Visit Number 4    Number of Visits 12    Date for PT Re-Evaluation 10/05/21    Authorization Type [employer]    Progress Note Due on Visit 10    PT Start Time 0845    PT Stop Time 0927    PT Time Calculation (min) 42 min    Activity Tolerance Patient tolerated treatment well    Behavior During Therapy [facility] for tasks assessed/performed             Past Medical History:  Diagnosis Date   Arthritis    Carpal tunnel syndrome on right 07/08/2017   Cervical radiculopathy at C6 27/74/1287   Right   Complication of anesthesia    Coronary artery disease    Enlarged prostate    H/O: knee surgery    15    History of open heart surgery    ASCENDING AORTIC ANEURYSM   Ischemic stroke of frontal lobe ([place]) 03/14/2017   Bilateral; post-redo CT surgery   PONV (postoperative nausea and vomiting)    only after CABG surgeries   Seizure disorder ([place]) 03/14/2017   Seizures ([place])    Status post knee surgery    DVT POST KNEE SURGERY    Past Surgical History:  Procedure Laterality Date   ANTERIOR CERVICAL DECOMP/DISCECTOMY FUSION N/A 05/04/2020   Procedure: Anterior Cervical Decompression/Discectomy Fuion Cervical three-four, Cervical four-five, Cervical five-six;  Surgeon: Kary Kos, MD;  Location: [place];  Service: Neurosurgery;  Laterality: N/A;   BALLOON DILATION N/A 06/23/2019   Procedure: BALLOON DILATION;  Surgeon: Otis Brace, MD;  [facility];  Service: Gastroenterology;  Laterality: N/A;   BENTALL PROCEDURE  01/04/2016   Bentall with 23 mm pericardial AVR; SVG-LAD, SVG-CX ([place])   BICEPS TENDON REPAIR Right    BIOPSY  06/23/2019   Procedure: BIOPSY;  Surgeon: Otis Brace, MD;  [facility];  Service: Gastroenterology;;   CARDIAC SURGERY     ANUERSYM MAY 2017   Bentall procedure. Bioprosthetic aortic valve #23 mm bovine model #2700 TF ask, and 28 mm Gelweave woven vascular sinus of Valsalva graft   CARPAL TUNNEL RELEASE  08/2018   right hand    CORONARY ARTERY BYPASS GRAFT  01/04/2016   VG to LAD & VG to LCX   CORONARY ARTERY BYPASS GRAFT  03/13/2017   LIMA to LAD with steril abcess removal from dacron graft   ESOPHAGOGASTRODUODENOSCOPY (EGD) WITH PROPOFOL N/A 06/23/2019   Procedure: ESOPHAGOGASTRODUODENOSCOPY (EGD) WITH PROPOFOL;  Surgeon: Otis Brace, MD;  [facility];  Service: Gastroenterology;  Laterality: N/A;   FALSE ANEURYSM REPAIR  03/13/2017   redo sternotomy, sterile abscess removal from Dacron graft, omental flap around aorta, CABG: LIMA-LAD ([name], Dr. Mart Piggs)   [place]  2018   Of pericardium 2018   INSERTION OF MESH  10/14/2020   Procedure: INSERTION OF MESH;  Surgeon: Michael Boston, MD;  Location: [place];  Service: General;;   knee surgeries     13 surgeries on knee done before knee replacement    [place]  LAPAROSCOPIC LYSIS OF ADHESIONS N/A 05/19/2021   Procedure: LYSIS OF ADHESIONS;  Surgeon: Michael Boston, MD;  Location: [place];  Service: General;  Laterality: N/A;  GEN & LOCAL   LYSIS OF ADHESION N/A 10/14/2020   Procedure: LYSIS OF ADHESION;  Surgeon: Michael Boston, MD;  Location: [place];  Service: General;  Laterality: N/A;   open heart surgery     03-13-2017   REPLACEMENT TOTAL KNEE  2015   ROTATOR CUFF REPAIR     TONSILLECTOMY     removed as a child.   VENTRAL HERNIA REPAIR N/A 10/14/2020   Procedure: LAPAROSCOPIC VENTRAL HERNIA REPAIR WITH TAP BLOCK BILATERAL;  Surgeon: Michael Boston, MD;  Location: [place];  Service: General;  Laterality: N/A;    VENTRAL HERNIA REPAIR N/A 05/19/2021   Procedure: LAPAROSCOPIC VENTRAL WALL HERNIA REPAIR;  Surgeon: Michael Boston, MD;  Location: [place];  Service: General;  Laterality: N/A;    There were no vitals filed for this visit.   Subjective Assessment - 09/07/21 0846     Subjective Pt reports everything is good, just a little stiff.    Pertinent History PMH: chronic pain,  stroke, HTN, dysphagia, gerd, tremor, seizure, cervical stenosis,  surgery - aortic aneurism repair, hernia repair, aortic valve repair, omental flap graft to mediastinum    Diagnostic tests recent back x-rays demonstrate excellent alighment and corporation of the implants    Patient Stated Goals decrease pain, return to gym    Currently in Pain? Yes    Pain Score 5     Pain Location Back    Pain Orientation Left;Lower    Pain Descriptors / Indicators Stabbing;Shooting    Pain Type Chronic pain                               [facility] Adult PT Treatment/Exercise - 09/07/21 0001       Lumbar Exercises: Aerobic   Recumbent Bike L2x41min   with moist heat to decrease lumbar stiffness     Lumbar Exercises: Machines for Strengthening   Other Lumbar Machine Exercise lat pulls 25lb 20 reps    Other Lumbar Machine Exercise seated rows 25lb 20 reps   cues to avoid going past neutral     Lumbar Exercises: Standing   Heel Raises 20 reps;2 seconds    Shoulder Extension Strengthening;Both;20 reps;Theraband    Theraband Level (Shoulder Extension) Level 3 (Green)    Other Standing Lumbar Exercises pallof presses with GTB 10x      Lumbar Exercises: Supine   Dead Bug 10 reps;2 seconds    Dead Bug Limitations cues and reminders needed for technique    Bridge 10 reps;5 seconds    Bridge Limitations GTB hip abduction      Lumbar Exercises: Sidelying   Clam Both;10 reps;5 seconds    Clam Limitations GTB      Modalities   Modalities Moist Heat      Moist Heat Therapy   Number Minutes Moist Heat 6 Minutes     Moist Heat Location Lumbar Spine                     PT Education - 09/07/21 0937     Education Details HEP prpogression and review    Person(s) Educated Patient    Methods Explanation;Demonstration;Verbal cues;Handout    Comprehension Verbalized understanding;Returned demonstration;Verbal cues required  PT Short Term Goals - 09/07/21 0935       PT SHORT TERM GOAL #1   Title Ind with initial HEP    Time 2    Period Weeks    Status Achieved   09/07/21   Target Date 09/07/21               PT Long Term Goals - 08/30/21 0851       PT LONG TERM GOAL #1   Title Ind with progressed HEP for core strengthening to improve outcomes.    Time 6    Period Weeks    Status On-going    Target Date 10/05/21      PT LONG TERM GOAL #2   Title Pt. will report 75% improvement in sleep disruptions due to LBP    Time 6    Period Weeks    Status On-going    Target Date 10/05/21      PT LONG TERM GOAL #3   Title Pt. will report 50% improve in LBP to improve QOL.    Baseline constant 5-6/10 L side LBP    Time 6    Period Weeks    Status On-going    Target Date 10/05/21      PT LONG TERM GOAL #4   Title Pt. will be able to transition to gym based exercise program incorporating lifting light weights safely.    Time 6    Period Weeks    Status On-going    Target Date 10/05/21                   Plan - 09/07/21 0936     Clinical Impression Statement Pt is able to progress with HEP and shows to require more higher level core exercises. With cuing, he shows a good demonstration of the exercises. He did report that his doctor wants him to refrain from any types of squats or DL. [employer] weight machines today with no problem. He does note some stiffness in his back after walking in the am and weakness with the higher level abdominal exercises due to hernia surgery in the past.    Personal Factors and Comorbidities Comorbidity 3+    Comorbidities  chronic pain,  stroke, HTN, dysphagia, gerd, tremor, seizure, cervical stenosis,  surgery - aortic aneurism repair, hernia repair, aortic valve repair, omental flap graft to mediastinum    PT Frequency 2x / week    PT Duration 6 weeks    PT Treatment/Interventions ADLs/Self Care Home Management;Cryotherapy;Electrical Stimulation;Moist Heat;Functional mobility training;Therapeutic activities;Therapeutic exercise;Balance training;Neuromuscular re-education;Patient/family education;Manual techniques;Passive range of motion;Dry needling;Taping;Spinal Manipulations;Joint Manipulations    PT Next Visit Plan continue to progress neutral spine strengthening and stretches as tolerated, manual therapy and modalities PRN    PT Home Exercise Plan [email or web address]    Consulted and Agree with Plan of Care Patient             Patient will benefit from skilled therapeutic intervention in order to improve the following deficits and impairments:  Decreased activity tolerance, Decreased range of motion, Decreased strength, Hypomobility, Increased fascial restricitons, Decreased mobility, Increased muscle spasms, Impaired flexibility, Postural dysfunction, Pain  Visit Diagnosis: Chronic left-sided low back pain with left-sided sciatica  Muscle weakness (generalized)  Other muscle spasm     Problem List Patient Active Problem List   Diagnosis Date Noted   Aortic atherosclerosis ([place]) 09/04/2021   Spondylolisthesis at L4-L5 level 06/28/2021   S/P repair of ventral hernia 05/19/2021  S/P repair of recurrent ventral hernia 05/19/2021   Benign prostatic hyperplasia without lower urinary tract symptoms 10/14/2020   Chronic pain 10/14/2020   ED (erectile dysfunction) of organic origin 10/14/2020   Esophageal dysphagia 10/14/2020   Gastroesophageal reflux disease 10/14/2020   History of aortic valve replacement with bioprosthetic valve 10/14/2020   Hx of aortic aneurysm repair 10/14/2020   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   Thoracic aortic aneurysm without rupture 10/14/2020   Recurrent incisional hernia 10/14/2020   Spinal stenosis in cervical region 05/04/2020   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   Cervical radiculopathy at C6 11/14/2017   Carpal tunnel syndrome on right 07/08/2017   Tremor, essential 07/08/2017   Ischemic stroke of frontal lobe ([place]) 04/05/2017   Pain in right hand 04/05/2017   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 03/27/2017   Seizure ([place]) 03/14/2017   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/13/2017   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

## 2021-09-07 NOTE — Patient Instructions (Addendum)
Access Code: [email or web address] URL: [email or web address] Date: 09/07/2021 Prepared by: Clarene Essex  Exercises Supine Lower Trunk Rotation - 1 x daily - 7 x weekly - 2 sets - 10 reps Seated Hamstring Stretch - 1 x daily - 7 x weekly - 1 sets - 3 reps - 30-34 sec hold Supine Figure 4 Piriformis Stretch - 1 x daily - 7 x weekly - 2 sets - 2 reps - 30 second hold Clamshell with Resistance - 1 x daily - 7 x weekly - 3 sets - 10 reps - 5 sec hold Supine Dead Bug with Leg Extension - 1 x daily - 7 x weekly - 2 sets - 10 reps Shoulder Extension with Anchored Resistance - 1 x daily - 7 x weekly - 3 sets - 10 reps Standing Anti-Rotation Press with Anchored Resistance - 1 x daily - 7 x weekly - 3 sets - 10 reps

## 2021-09-11 ENCOUNTER — Ambulatory Visit (INDEPENDENT_AMBULATORY_CARE_PROVIDER_SITE_OTHER): Payer: Medicare HMO | Admitting: Gastroenterology

## 2021-09-11 ENCOUNTER — Encounter: Payer: Medicare HMO | Admitting: Physical Therapy

## 2021-09-11 ENCOUNTER — Encounter: Payer: Self-pay | Admitting: Gastroenterology

## 2021-09-11 ENCOUNTER — Other Ambulatory Visit: Payer: Self-pay

## 2021-09-11 VITALS — BP 144/80 | HR 86 | Ht 69.0 in | Wt 205.4 lb

## 2021-09-11 DIAGNOSIS — Z1211 Encounter for screening for malignant neoplasm of colon: Secondary | ICD-10-CM

## 2021-09-11 DIAGNOSIS — R569 Unspecified convulsions: Secondary | ICD-10-CM

## 2021-09-11 DIAGNOSIS — Z8679 Personal history of other diseases of the circulatory system: Secondary | ICD-10-CM

## 2021-09-11 DIAGNOSIS — Z953 Presence of xenogenic heart valve: Secondary | ICD-10-CM

## 2021-09-11 DIAGNOSIS — I639 Cerebral infarction, unspecified: Secondary | ICD-10-CM

## 2021-09-11 DIAGNOSIS — Z9889 Other specified postprocedural states: Secondary | ICD-10-CM

## 2021-09-11 MED ORDER — SUTAB 1479-225-188 MG PO TABS: 1.0000 | ORAL_TABLET | Freq: Once | ORAL | 0 refills | Status: AC

## 2021-09-11 NOTE — Progress Notes (Signed)
Chief Complaint: Colon cancer screening   Referring Provider:     Marrian Salvage, FNP    HPI:     Erik Salazar is a 71 y.o. male with a history of CAD s/p CABG (on ASA), bilateral frontal stroke following aortic valve surgery 02/2017, seizure disorder, cervical spine fusion 2022, lumbar spine surgery 2022, AAA s/p Bentall procedure with AVR, referred to the Gastroenterology Clinic for evaluation of ongoing colon cancer screening.  Last colonoscopy was 2017 by Dr.Karhlan in [place], [place].  No report available for review, but he very clearly remembers being on a 5-year recall.  He is otherwise without any GI symptoms today. Has had approx 10 colonoscopies, but never any polyps per patient that he can recall.  No known FHx of colon cancer.   Underwent laparoscopic repair of incisional recurrent subxiphoid hernia with mesh and LOA on 05/19/2021 by Dr. Johney Maine.  Cervical spine fusion in 2022 along with lumbar decompressive laminectomy with radical foraminotomies of L5 and S1 in 06/2021.  Last TTE was 11/2020 with EF 55-60%.   Based on his significant surgical history in 2022, including lumbar surgery about 8 weeks ago, he would like to postpone any screening colonoscopy for the next several months.  He presents to the clinic today with his wife.  Endoscopic History: - EGD (06/2019, Dr. Alessandra Bevels): Schatzki's ring dilated with 18 mm TTS balloon, small HH, normal stomach/duodenum   Past Medical History:  Diagnosis Date   Arthritis    Carpal tunnel syndrome on right 07/08/2017   Cervical radiculopathy at C6 01/74/9449   Right   Complication of anesthesia    Coronary artery disease    Enlarged prostate    H/O: knee surgery    15    History of open heart surgery    ASCENDING AORTIC ANEURYSM   Ischemic stroke of frontal lobe ([place]) 03/14/2017   Bilateral; post-redo CT surgery   PONV (postoperative nausea and vomiting)    only after CABG surgeries   Seizure  disorder ([place]) 03/14/2017   Seizures ([place])    Status post knee surgery    DVT POST KNEE SURGERY     Past Surgical History:  Procedure Laterality Date   ANTERIOR CERVICAL DECOMP/DISCECTOMY FUSION N/A 05/04/2020   Procedure: Anterior Cervical Decompression/Discectomy Fuion Cervical three-four, Cervical four-five, Cervical five-six;  Surgeon: Kary Kos, MD;  Location: [place];  Service: Neurosurgery;  Laterality: N/A;   BALLOON DILATION N/A 06/23/2019   Procedure: BALLOON DILATION;  Surgeon: Otis Brace, MD;  [facility];  Service: Gastroenterology;  Laterality: N/A;   BENTALL PROCEDURE  01/04/2016   Bentall with 23 mm pericardial AVR; SVG-LAD, SVG-CX ([place])   BICEPS TENDON REPAIR Right    BIOPSY  06/23/2019   Procedure: BIOPSY;  Surgeon: Otis Brace, MD;  [facility];  Service: Gastroenterology;;   CARDIAC SURGERY     ANUERSYM MAY 2017   Bentall procedure. Bioprosthetic aortic valve #23 mm bovine model #2700 TF ask, and 28 mm Gelweave woven vascular sinus of Valsalva graft   CARPAL TUNNEL RELEASE  08/2018   right hand    CORONARY ARTERY BYPASS GRAFT  01/04/2016   VG to LAD & VG to LCX   CORONARY ARTERY BYPASS GRAFT  03/13/2017   LIMA to LAD with steril abcess removal from dacron graft   ESOPHAGOGASTRODUODENOSCOPY (EGD) WITH PROPOFOL N/A 06/23/2019   Procedure: ESOPHAGOGASTRODUODENOSCOPY (EGD) WITH PROPOFOL;  Surgeon: Alessandra Bevels,  Orson Gear, MD;  [facility];  Service: Gastroenterology;  Laterality: N/A;   FALSE ANEURYSM REPAIR  03/13/2017   redo sternotomy, sterile abscess removal from Dacron graft, omental flap around aorta, CABG: LIMA-LAD ([name], Dr. Mart Piggs)   [place]  2018   Of pericardium 2018   INSERTION OF MESH  10/14/2020   Procedure: INSERTION OF MESH;  Surgeon: Michael Boston, MD;  Location: [place];  Service: General;;   knee surgeries     13 surgeries on knee done before knee  replacement    [place] N/A 05/19/2021   Procedure: LYSIS OF ADHESIONS;  Surgeon: Michael Boston, MD;  Location: [place];  Service: General;  Laterality: N/A;  GEN & LOCAL   LYSIS OF ADHESION N/A 10/14/2020   Procedure: LYSIS OF ADHESION;  Surgeon: Michael Boston, MD;  Location: [place];  Service: General;  Laterality: N/A;   open heart surgery     03-13-2017   REPLACEMENT TOTAL KNEE  2015   ROTATOR CUFF REPAIR     TONSILLECTOMY     removed as a child.   VENTRAL HERNIA REPAIR N/A 10/14/2020   Procedure: LAPAROSCOPIC VENTRAL HERNIA REPAIR WITH TAP BLOCK BILATERAL;  Surgeon: Michael Boston, MD;  Location: [place];  Service: General;  Laterality: N/A;   VENTRAL HERNIA REPAIR N/A 05/19/2021   Procedure: LAPAROSCOPIC VENTRAL WALL HERNIA REPAIR;  Surgeon: Michael Boston, MD;  Location: [place];  Service: General;  Laterality: N/A;   Family History  Problem Relation Age of Onset   Arthritis Mother    Aneurysm Mother        brain aneurysm for mother.    Arthritis Father    Social History   Tobacco Use   Smoking status: Former    Types: Cigars   Smokeless tobacco: Never  Vaping Use   Vaping Use: Never used  Substance Use Topics   Alcohol use: Yes    Alcohol/week: 1.0 standard drink    Types: 1 Cans of beer per week    Comment: occassionally   Drug use: No   Current Outpatient Medications  Medication Sig Dispense Refill   ascorbic acid (VITAMIN C) 500 MG tablet Take 500 mg by mouth daily.     aspirin EC 81 MG tablet Take 81 mg by mouth daily. Swallow whole.     Cholecalciferol (VITAMIN D3) 50 MCG (2000 UT) TABS Take 2,000 Units by mouth daily.     levETIRAcetam (KEPPRA) 1000 MG tablet Take 1 tablet (1,000 mg total) by mouth 2 (two) times daily. 180 tablet 4   metoprolol succinate (TOPROL-XL) 25 MG 24 hr tablet Take 0.5 tablets (12.5 mg total) by mouth daily. With or immediately following a meal. (Patient taking differently: Take 12.5 mg by mouth at  bedtime. With or immediately following a meal.) 45 tablet 3   pravastatin (PRAVACHOL) 40 MG tablet Take 1 tablet (40 mg total) by mouth every evening. 90 tablet 3   pyridOXINE (VITAMIN B-6) 100 MG tablet Take 100 mg by mouth daily.     tamsulosin (FLOMAX) 0.4 MG CAPS capsule Take 0.4 mg by mouth every evening.      No current facility-administered medications for this visit.   Allergies  Allergen Reactions   Oxycodone Hcl Other (See Comments)    DELUSIONAL   Zetia [Ezetimibe] Other (See Comments)    Leg cramps   Augmentin [Amoxicillin-Pot Clavulanate] Nausea Only    Dizzy   Chlorhexidine Rash  Elemental Sulfur Rash     Review of Systems: All systems reviewed and negative except where noted in HPI.     Physical Exam:    Wt Readings from Last 3 Encounters:  09/04/21 199 lb (90.3 kg)  08/18/21 202 lb 3.2 oz (91.7 kg)  06/26/21 198 lb 14.4 oz (90.2 kg)    There were no vitals taken for this visit. Constitutional:  Pleasant, in no acute distress. Psychiatric: Normal mood and affect. Behavior is normal. Cardiovascular: Normal rate, regular rhythm. No edema Pulmonary/chest: Effort normal and breath sounds normal. No wheezing, rales or rhonchi. Abdominal: Soft, nondistended, nontender. Bowel sounds active throughout. There are no masses palpable. No hepatomegaly. Neurological: Alert and oriented to person place and time. Skin: Skin is warm and dry. No rashes noted.   ASSESSMENT AND PLAN;   1) Colon cancer screening - Per recommendation of the previous endoscopist, due for repeat colon cancer screening.  We discussed CRC screening at length today, to include optical colonoscopy vs Cologuard.  He strongly prefers the former. - Plan for colonoscopy in 3+ months, allowing for recovery from recent surgeries - Sutab for bowel prep per patient preference/request - If colonoscopy normal/unrevealing, can hopefully liberalize screening intervals - Will d/w Anesthesia staff re: [facility] vs  [facility]  2) History of CAD s/p CABG 3) History of aortic valve replacement 4) History of cervical spine surgery 5) History of lumbar spine surgery 6) History of postoperative CVA 7) History of seizure disorder - Will d/w Anesthesia staff re: [place] vs [facility] - Will plan to obtain Neurology clearance prior to colonoscopy - Will obtain Cardiology clearance prior to colonoscopy - Delaying screening colonoscopy to allow recovery from recent lumbar spine surgery  The indications, risks, and benefits of colonoscopy were explained to the patient in detail. Risks include but are not limited to bleeding, perforation, adverse reaction to medications, and cardiopulmonary compromise. Sequelae include but are not limited to the possibility of surgery, hospitalization, and mortality. The patient verbalized understanding and wished to proceed. All questions answered, referred to the scheduler and bowel prep ordered. Further recommendations pending results of the exam.    Lavena Bullion, DO, FACG  09/11/2021, 1:12 PM   Marrian Salvage,*

## 2021-09-11 NOTE — Patient Instructions (Addendum)
If you are age 71 or older, your body mass index should be between 23-30. Your Body mass index is 30.33 kg/m. If this is out of the aforementioned range listed, please consider follow up with your Primary Care Provider.  __________________________________________________________  The [facility] providers would like to encourage you to use [facility] to communicate with providers for non-urgent requests or questions.  Due to long hold times on the telephone, sending your provider a message by [facility] may be a faster and more efficient way to get a response.  Please allow 48 business hours for a response.  Please remember that this is for non-urgent requests.   Due to recent changes in healthcare laws, you may see the results of your imaging and laboratory studies on MyChart before your provider has had a chance to review them.  We understand that in some cases there may be results that are confusing or concerning to you. Not all laboratory results come back in the same time frame and the provider may be waiting for multiple results in order to interpret others.  Please give [place] 48 hours in order for your provider to thoroughly review all the results before contacting the office for clarification of your results.   It has been recommended to you by your physician that you have a(n) Colonoscopy completed. Per your request, we did not schedule the procedure(s) today. Please contact our office at [phone] should you decide to have the procedure completed. You will be scheduled for a pre-visit and procedure at that time.   We have sent the following medications to your pharmacy for you to pick up at your convenience: Sutab  Thank you for choosing me and [facility].  Vito Cirigliano, D.O.

## 2021-09-14 ENCOUNTER — Ambulatory Visit: Payer: Medicare HMO

## 2021-09-18 ENCOUNTER — Other Ambulatory Visit: Payer: Self-pay

## 2021-09-18 ENCOUNTER — Ambulatory Visit: Payer: Medicare HMO

## 2021-09-18 DIAGNOSIS — M6281 Muscle weakness (generalized): Secondary | ICD-10-CM

## 2021-09-18 DIAGNOSIS — M5442 Lumbago with sciatica, left side: Secondary | ICD-10-CM | POA: Diagnosis not present

## 2021-09-18 DIAGNOSIS — G8929 Other chronic pain: Secondary | ICD-10-CM

## 2021-09-18 DIAGNOSIS — M62838 Other muscle spasm: Secondary | ICD-10-CM

## 2021-09-18 NOTE — Therapy (Signed)
[facility] [address]  [place], [place], [address] Phone: [phone]   Fax:  [phone]  Physical Therapy Treatment  Patient Details  Name: Erik Salazar MRN: [id number] Date of Birth: 04/02/1951 Referring Provider (PT): Kary Kos MD   Encounter Date: 09/18/2021   PT End of Session - 09/18/21 0938     Visit Number 5    Number of Visits 12    Date for PT Re-Evaluation 10/05/21    Authorization Type [employer]na Medicare    Progress Note Due on Visit 10    PT Start Time 0847    PT Stop Time 0933    PT Time Calculation (min) 46 min    Activity Tolerance Patient tolerated treatment well    Behavior During Therapy [facility] for tasks assessed/performed             Past Medical History:  Diagnosis Date   Arthritis    Carpal tunnel syndrome on right 07/08/2017   Cervical radiculopathy at C6 57/32/2025   Right   Complication of anesthesia    Coronary artery disease    Enlarged prostate    H/O: knee surgery    15    History of blood clots    History of open heart surgery    ASCENDING AORTIC ANEURYSM   Ischemic stroke of frontal lobe ([place]) 03/14/2017   Bilateral; post-redo CT surgery   Pneumonia    PONV (postoperative nausea and vomiting)    only after CABG surgeries   Seizure disorder ([place]) 03/14/2017   Seizures ([place])    Status post knee surgery    DVT POST KNEE SURGERY   Stroke [facility])     Past Surgical History:  Procedure Laterality Date   ANTERIOR CERVICAL DECOMP/DISCECTOMY FUSION N/A 05/04/2020   Procedure: Anterior Cervical Decompression/Discectomy Fuion Cervical three-four, Cervical four-five, Cervical five-six;  Surgeon: Kary Kos, MD;  Location: [place];  Service: Neurosurgery;  Laterality: N/A;   BALLOON DILATION N/A 06/23/2019   Procedure: BALLOON DILATION;  Surgeon: Otis Brace, MD;  [facility];  Service: Gastroenterology;  Laterality: N/A;   BENTALL PROCEDURE  01/04/2016    Bentall with 23 mm pericardial AVR; SVG-LAD, SVG-CX ([place])   BICEPS TENDON REPAIR Right    BIOPSY  06/23/2019   Procedure: BIOPSY;  Surgeon: Otis Brace, MD;  [facility];  Service: Gastroenterology;;   CARDIAC SURGERY     ANUERSYM MAY 2017   Bentall procedure. Bioprosthetic aortic valve #23 mm bovine model #2700 TF ask, and 28 mm Gelweave woven vascular sinus of Valsalva graft   CARPAL TUNNEL RELEASE  08/2018   right hand    CORONARY ARTERY BYPASS GRAFT  01/04/2016   VG to LAD & VG to LCX   CORONARY ARTERY BYPASS GRAFT  03/13/2017   LIMA to LAD with steril abcess removal from dacron graft   ESOPHAGOGASTRODUODENOSCOPY     Had done dilatation done about 2 or 3 times before in [place]   ESOPHAGOGASTRODUODENOSCOPY (EGD) WITH PROPOFOL N/A 06/23/2019   Procedure: ESOPHAGOGASTRODUODENOSCOPY (EGD) WITH PROPOFOL;  Surgeon: Otis Brace, MD;  [facility];  Service: Gastroenterology;  Laterality: N/A;   FALSE ANEURYSM REPAIR  03/13/2017   redo sternotomy, sterile abscess removal from Dacron graft, omental flap around aorta, CABG: LIMA-LAD ([name], Dr. Mart Piggs)   [place]  2018   Of pericardium 2018   INSERTION OF MESH  10/14/2020   Procedure: INSERTION OF MESH;  Surgeon:  Michael Boston, MD;  Location: [place];  Service: General;;   knee surgeries     13 surgeries on knee done before knee replacement    [place] N/A 05/19/2021   Procedure: LYSIS OF ADHESIONS;  Surgeon: Michael Boston, MD;  Location: [place];  Service: General;  Laterality: N/A;  GEN & LOCAL   LYSIS OF ADHESION N/A 10/14/2020   Procedure: LYSIS OF ADHESION;  Surgeon: Michael Boston, MD;  Location: [place];  Service: General;  Laterality: N/A;   open heart surgery     03-13-2017   REPLACEMENT TOTAL KNEE  2015   ROTATOR CUFF REPAIR     TONSILLECTOMY     removed as a child.   VENTRAL HERNIA REPAIR N/A 10/14/2020    Procedure: LAPAROSCOPIC VENTRAL HERNIA REPAIR WITH TAP BLOCK BILATERAL;  Surgeon: Michael Boston, MD;  Location: [place];  Service: General;  Laterality: N/A;   VENTRAL HERNIA REPAIR N/A 05/19/2021   Procedure: LAPAROSCOPIC VENTRAL WALL HERNIA REPAIR;  Surgeon: Michael Boston, MD;  Location: [place];  Service: General;  Laterality: N/A;    There were no vitals filed for this visit.   Subjective Assessment - 09/18/21 0850     Subjective Pt still reporting stiffness in the lower back, about the same today.    Pertinent History PMH: chronic pain,  stroke, HTN, dysphagia, gerd, tremor, seizure, cervical stenosis,  surgery - aortic aneurism repair, hernia repair, aortic valve repair, omental flap graft to mediastinum    Diagnostic tests recent back x-rays demonstrate excellent alighment and corporation of the implants    Patient Stated Goals decrease pain, return to gym    Currently in Pain? Yes    Pain Score 5     Pain Location Back    Pain Orientation Left;Lower    Pain Descriptors / Indicators Stabbing;Shooting    Pain Type Chronic pain                               [facility] Adult PT Treatment/Exercise - 09/18/21 0001       Lumbar Exercises: Stretches   Figure 4 Stretch 2 reps;30 seconds;Seated;With overpressure    Figure 4 Stretch Limitations seated    Other Lumbar Stretch Exercise 3 way lumbar flexion/rotation stretch with green Pball 4x10"      Lumbar Exercises: Aerobic   Tread Mill 3.1 mph, no incline, 75mn    Recumbent Bike L3x678m   with moist heat     Lumbar Exercises: Machines for Strengthening   Other Lumbar Machine Exercise lat pulls 25lb 10 reps; standing lat pulls 15 reps, 10 reps    Other Lumbar Machine Exercise seated rows 25lb 10 reps      Modalities   Modalities Moist Heat      Moist Heat Therapy   Number Minutes Moist Heat 6 Minutes    Moist Heat Location Lumbar Spine      Manual Therapy   Manual Therapy Soft tissue mobilization;Myofascial  release    Soft tissue mobilization STM to lumbar paraspinals, QL, glutes    Myofascial Release MFR to L glutes, QL                       PT Short Term Goals - 09/07/21 0935       PT SHORT TERM GOAL #1   Title Ind with initial HEP    Time 2  Period Weeks    Status Achieved   09/07/21   Target Date 09/07/21               PT Long Term Goals - 09/18/21 0924       PT LONG TERM GOAL #1   Title Ind with progressed HEP for core strengthening to improve outcomes.    Time 6    Period Weeks    Status On-going    Target Date 10/05/21      PT LONG TERM GOAL #2   Title Pt. will report 75% improvement in sleep disruptions due to LBP    Time 6    Period Weeks    Status On-going   50% improvement, still notes some sleep disruption every night   Target Date 10/05/21      PT LONG TERM GOAL #3   Title Pt. will report 50% improve in LBP to improve QOL.    Baseline constant 5-6/10 L side LBP    Time 6    Period Weeks    Status Achieved   1/30   Target Date 10/05/21      PT LONG TERM GOAL #4   Title Pt. will be able to transition to gym based exercise program incorporating lifting light weights safely.    Time 6    Period Weeks    Status On-going    Target Date 10/05/21                   Plan - 09/18/21 0944     Clinical Impression Statement Pt reported no significant changes in low back as of yet. Today focused on reviewing gym equipment and did some STM to relief ongoing muscle stiffness. Although he is still limited by tight musculature in the lower back he notes much improvement in his pain levels with sleep and ADLs. Instructed pt on using gym equipment and avoiding any overly strenuous exercise. Exercises were well tolerated with no increased pain.  Pt has met LTG#3 today.    Personal Factors and Comorbidities Comorbidity 3+    Comorbidities chronic pain,  stroke, HTN, dysphagia, gerd, tremor, seizure, cervical stenosis,  surgery - aortic aneurism  repair, hernia repair, aortic valve repair, omental flap graft to mediastinum    PT Frequency 2x / week    PT Duration 6 weeks    PT Treatment/Interventions ADLs/Self Care Home Management;Cryotherapy;Electrical Stimulation;Moist Heat;Functional mobility training;Therapeutic activities;Therapeutic exercise;Balance training;Neuromuscular re-education;Patient/family education;Manual techniques;Passive range of motion;Dry needling;Taping;Spinal Manipulations;Joint Manipulations    PT Next Visit Plan QL and glute stretching; continue to progress neutral spine strengthening and stretches as tolerated, manual therapy and modalities PRN    PT Home Exercise Plan [email or web address]    Consulted and Agree with Plan of Care Patient             Patient will benefit from skilled therapeutic intervention in order to improve the following deficits and impairments:  Decreased activity tolerance, Decreased range of motion, Decreased strength, Hypomobility, Increased fascial restricitons, Decreased mobility, Increased muscle spasms, Impaired flexibility, Postural dysfunction, Pain  Visit Diagnosis: Chronic left-sided low back pain with left-sided sciatica  Muscle weakness (generalized)  Other muscle spasm     Problem List Patient Active Problem List   Diagnosis Date Noted   Aortic atherosclerosis ([place]) 09/04/2021   Spondylolisthesis at L4-L5 level 06/28/2021   S/P repair of ventral hernia 05/19/2021   S/P repair of recurrent ventral hernia 05/19/2021   Benign prostatic hyperplasia without lower urinary tract symptoms 10/14/2020  Chronic pain 10/14/2020   ED (erectile dysfunction) of organic origin 10/14/2020   Esophageal dysphagia 10/14/2020   Gastroesophageal reflux disease 10/14/2020   History of aortic valve replacement with bioprosthetic valve 10/14/2020   Hx of aortic aneurysm repair 10/14/2020   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   Thoracic aortic aneurysm without rupture 10/14/2020    Recurrent incisional hernia 10/14/2020   Spinal stenosis in cervical region 05/04/2020   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   Cervical radiculopathy at C6 11/14/2017   Carpal tunnel syndrome on right 07/08/2017   Tremor, essential 07/08/2017   Ischemic stroke of frontal lobe ([place]) 04/05/2017   Pain in right hand 04/05/2017   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 03/27/2017   Seizure ([place]) 03/14/2017   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/13/2017   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  [place], [place], [address] Phone: [phone]   Fax:  [phone]  Name: Erik Salazar MRN: [id number] Date of Birth: March 29, 1951

## 2021-09-21 ENCOUNTER — Ambulatory Visit: Payer: Medicare HMO | Attending: Neurosurgery | Admitting: Physical Therapy

## 2021-09-21 ENCOUNTER — Encounter: Payer: Self-pay | Admitting: Physical Therapy

## 2021-09-21 ENCOUNTER — Other Ambulatory Visit: Payer: Self-pay

## 2021-09-21 DIAGNOSIS — M5442 Lumbago with sciatica, left side: Secondary | ICD-10-CM | POA: Diagnosis not present

## 2021-09-21 DIAGNOSIS — M6281 Muscle weakness (generalized): Secondary | ICD-10-CM | POA: Insufficient documentation

## 2021-09-21 DIAGNOSIS — M62838 Other muscle spasm: Secondary | ICD-10-CM | POA: Insufficient documentation

## 2021-09-21 DIAGNOSIS — G8929 Other chronic pain: Secondary | ICD-10-CM | POA: Insufficient documentation

## 2021-09-21 NOTE — Therapy (Signed)
[facility] [address]  [place], [place], [address] Phone: [phone]   Fax:  [phone]  Physical Therapy Treatment  Patient Details  Name: Erik Salazar MRN: [id number] Date of Birth: Feb 08, 1951 Referring Provider (PT): Kary Kos MD   Encounter Date: 09/21/2021   PT End of Session - 09/21/21 0810     Visit Number 6    Number of Visits 12    Date for PT Re-Evaluation 10/05/21    Authorization Type Aetna Medicare    Progress Note Due on Visit 10    PT Start Time 0802    PT Stop Time 0845    PT Time Calculation (min) 43 min    Activity Tolerance Patient tolerated treatment well    Behavior During Therapy [facility] for tasks assessed/performed             Past Medical History:  Diagnosis Date   Arthritis    Carpal tunnel syndrome on right 07/08/2017   Cervical radiculopathy at C6 16/38/4665   Right   Complication of anesthesia    Coronary artery disease    Enlarged prostate    H/O: knee surgery    15    History of blood clots    History of open heart surgery    ASCENDING AORTIC ANEURYSM   Ischemic stroke of frontal lobe ([place]) 03/14/2017   Bilateral; post-redo CT surgery   Pneumonia    PONV (postoperative nausea and vomiting)    only after CABG surgeries   Seizure disorder ([place]) 03/14/2017   Seizures ([place])    Status post knee surgery    DVT POST KNEE SURGERY   Stroke [facility])     Past Surgical History:  Procedure Laterality Date   ANTERIOR CERVICAL DECOMP/DISCECTOMY FUSION N/A 05/04/2020   Procedure: Anterior Cervical Decompression/Discectomy Fuion Cervical three-four, Cervical four-five, Cervical five-six;  Surgeon: Kary Kos, MD;  Location: [place];  Service: Neurosurgery;  Laterality: N/A;   BALLOON DILATION N/A 06/23/2019   Procedure: BALLOON DILATION;  Surgeon: Otis Brace, MD;  [facility];  Service: Gastroenterology;  Laterality: N/A;   BENTALL PROCEDURE  01/04/2016    Bentall with 23 mm pericardial AVR; SVG-LAD, SVG-CX ([place])   BICEPS TENDON REPAIR Right    BIOPSY  06/23/2019   Procedure: BIOPSY;  Surgeon: Otis Brace, MD;  [facility];  Service: Gastroenterology;;   CARDIAC SURGERY     ANUERSYM MAY 2017   Bentall procedure. Bioprosthetic aortic valve #23 mm bovine model #2700 TF ask, and 28 mm Gelweave woven vascular sinus of Valsalva graft   CARPAL TUNNEL RELEASE  08/2018   right hand    CORONARY ARTERY BYPASS GRAFT  01/04/2016   VG to LAD & VG to LCX   CORONARY ARTERY BYPASS GRAFT  03/13/2017   LIMA to LAD with steril abcess removal from dacron graft   ESOPHAGOGASTRODUODENOSCOPY     Had done dilatation done about 2 or 3 times before in [place]   ESOPHAGOGASTRODUODENOSCOPY (EGD) WITH PROPOFOL N/A 06/23/2019   Procedure: ESOPHAGOGASTRODUODENOSCOPY (EGD) WITH PROPOFOL;  Surgeon: Otis Brace, MD;  [facility];  Service: Gastroenterology;  Laterality: N/A;   FALSE ANEURYSM REPAIR  03/13/2017   redo sternotomy, sterile abscess removal from Dacron graft, omental flap around aorta, CABG: LIMA-LAD ([name], Dr. Mart Piggs)   [place]  2018   Of pericardium 2018   INSERTION OF MESH  10/14/2020   Procedure: INSERTION OF MESH;  Surgeon:  Michael Boston, MD;  Location: [place];  Service: General;;   knee surgeries     13 surgeries on knee done before knee replacement    [place] N/A 05/19/2021   Procedure: LYSIS OF ADHESIONS;  Surgeon: Michael Boston, MD;  Location: [place];  Service: General;  Laterality: N/A;  GEN & LOCAL   LYSIS OF ADHESION N/A 10/14/2020   Procedure: LYSIS OF ADHESION;  Surgeon: Michael Boston, MD;  Location: [place];  Service: General;  Laterality: N/A;   open heart surgery     03-13-2017   REPLACEMENT TOTAL KNEE  2015   ROTATOR CUFF REPAIR     TONSILLECTOMY     removed as a child.   VENTRAL HERNIA REPAIR N/A 10/14/2020    Procedure: LAPAROSCOPIC VENTRAL HERNIA REPAIR WITH TAP BLOCK BILATERAL;  Surgeon: Michael Boston, MD;  Location: [place];  Service: General;  Laterality: N/A;   VENTRAL HERNIA REPAIR N/A 05/19/2021   Procedure: LAPAROSCOPIC VENTRAL WALL HERNIA REPAIR;  Surgeon: Michael Boston, MD;  Location: [place];  Service: General;  Laterality: N/A;    There were no vitals filed for this visit.   Subjective Assessment - 09/21/21 0807     Subjective Pt. reports he hasn't been sleeping well, walking up in pain, but LTR help if do right away.  Think the weather is making it worse.  Already got his 10,000 steps this morning.  He reported that Dr. Saintclair Halsted would like [place] to address his balance as well.    Pertinent History PMH: chronic pain,  stroke, HTN, dysphagia, gerd, tremor, seizure, cervical stenosis,  surgery - aortic aneurism repair, hernia repair, aortic valve repair, omental flap graft to mediastinum    Diagnostic tests recent back x-rays demonstrate excellent alighment and corporation of the implants    Patient Stated Goals decrease pain, return to gym                [facility] PT Assessment - 09/21/21 0001       Balance   Balance Assessed Yes      Standardized Balance Assessment   Standardized Balance Assessment Dynamic Gait Index      Dynamic Gait Index   Level Surface Normal    Change in Gait Speed Mild Impairment    Gait with Horizontal Head Turns Severe Impairment    Gait with Vertical Head Turns Moderate Impairment    Gait and Pivot Turn Normal    Step Over Obstacle Normal    Step Around Obstacles Normal    Steps Normal    Total Score 18    DGI comment: high risk for falls                           [facility] Adult PT Treatment/Exercise - 09/21/21 0001       Exercises   Exercises Lumbar      Lumbar Exercises: Aerobic   Recumbent Bike L3x29min      Lumbar Exercises: Standing   Functional Squats 10 reps    Functional Squats Limitations UE support on counter with chair  behind for safety    Other Standing Lumbar Exercises standing hip extension x 10 bil, standing hip abduction x 10 bil with counter support      Modalities   Modalities Moist Heat      Moist Heat Therapy   Number Minutes Moist Heat 6 Minutes   during bike  Moist Heat Location Lumbar Spine                 Balance Exercises - 09/21/21 0001       Balance Exercises: Standing   Standing Eyes Opened Head turns;Foam/compliant surface;Limitations    Standing Eyes Opened Limitations on airex feet apart eyes open x 30 sec, head nods x 10, head turns x 10 in corner for safety with SBA    Standing Eyes Closed Head turns;Foam/compliant surface;Limitations    Standing Eyes Closed Limitations on airex feet apart eyes closed x 30 sec, head nods x 10, head turns x 10 in corner for safety with SBA.  Large sway and frequent touches on wall for balance with eyes closed.  repeated on level surface with feet together, challenged but no LOB.    Tandem Stance 2 reps;30 secs;Limitations    Tandem Stance Time in corner for safety    SLS Eyes open;30 secs;Upper extremity support 2   at counter               PT Education - 09/21/21 0848     Education Details HEP update for balance and standing corner exercises Access Code: [facility]    Person(s) Educated Patient    Methods Explanation;Demonstration;Verbal cues;Handout    Comprehension Verbalized understanding;Returned demonstration              PT Short Term Goals - 09/07/21 0935       PT SHORT TERM GOAL #1   Title Ind with initial HEP    Time 2    Period Weeks    Status Achieved   09/07/21   Target Date 09/07/21               PT Long Term Goals - 09/18/21 0924       PT LONG TERM GOAL #1   Title Ind with progressed HEP for core strengthening to improve outcomes.    Time 6    Period Weeks    Status On-going    Target Date 10/05/21      PT LONG TERM GOAL #2   Title Pt. will report 75% improvement in sleep disruptions  due to LBP    Time 6    Period Weeks    Status On-going   50% improvement, still notes some sleep disruption every night   Target Date 10/05/21      PT LONG TERM GOAL #3   Title Pt. will report 50% improve in LBP to improve QOL.    Baseline constant 5-6/10 L side LBP    Time 6    Period Weeks    Status Achieved   1/30   Target Date 10/05/21      PT LONG TERM GOAL #4   Title Pt. will be able to transition to gym based exercise program incorporating lifting light weights safely.    Time 6    Period Weeks    Status On-going    Target Date 10/05/21                   Plan - 09/21/21 0848     Clinical Impression Statement Pt. reports having difficulty with balance since the back surgery and feeling off balance when walking.  Today assessed balance, noted reliance on visual system and difficulty maintaining balance with eyes closed on compliant surface and with head movements.  During DGI noted significant LOB when turning head to R.  He scored 18/24 on DGI which places him  at high risk of falls.  Added standing corner balance exercises to HEP, instructed to perform on level surface with wife supervising or place chair in front for safety.    Personal Factors and Comorbidities Comorbidity 3+    Comorbidities chronic pain,  stroke, HTN, dysphagia, gerd, tremor, seizure, cervical stenosis,  surgery - aortic aneurism repair, hernia repair, aortic valve repair, omental flap graft to mediastinum    PT Frequency 2x / week    PT Duration 6 weeks    PT Treatment/Interventions ADLs/Self Care Home Management;Cryotherapy;Electrical Stimulation;Moist Heat;Functional mobility training;Therapeutic activities;Therapeutic exercise;Balance training;Neuromuscular re-education;Patient/family education;Manual techniques;Passive range of motion;Dry needling;Taping;Spinal Manipulations;Joint Manipulations    PT Next Visit Plan QL and glute stretching; continue to progress neutral spine strengthening and  stretches as tolerated, manual therapy and modalities PRN    PT Home Exercise Plan [email or web address]    Consulted and Agree with Plan of Care Patient             Patient will benefit from skilled therapeutic intervention in order to improve the following deficits and impairments:  Decreased activity tolerance, Decreased range of motion, Decreased strength, Hypomobility, Increased fascial restricitons, Decreased mobility, Increased muscle spasms, Impaired flexibility, Postural dysfunction, Pain  Visit Diagnosis: Chronic left-sided low back pain with left-sided sciatica  Muscle weakness (generalized)  Other muscle spasm     Problem List Patient Active Problem List   Diagnosis Date Noted   Aortic atherosclerosis ([place]) 09/04/2021   Spondylolisthesis at L4-L5 level 06/28/2021   S/P repair of ventral hernia 05/19/2021   S/P repair of recurrent ventral hernia 05/19/2021   Benign prostatic hyperplasia without lower urinary tract symptoms 10/14/2020   Chronic pain 10/14/2020   ED (erectile dysfunction) of organic origin 10/14/2020   Esophageal dysphagia 10/14/2020   Gastroesophageal reflux disease 10/14/2020   History of aortic valve replacement with bioprosthetic valve 10/14/2020   Hx of aortic aneurysm repair 10/14/2020   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   Thoracic aortic aneurysm without rupture 10/14/2020   Recurrent incisional hernia 10/14/2020   Spinal stenosis in cervical region 05/04/2020   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   Cervical radiculopathy at C6 11/14/2017   Carpal tunnel syndrome on right 07/08/2017   Tremor, essential 07/08/2017   Ischemic stroke of frontal lobe ([place]) 04/05/2017   Pain in right hand 04/05/2017   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 03/27/2017   Seizure ([place]) 03/14/2017   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/13/2017   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  [place], [place], [address] Phone: [phone]   Fax:  [phone]  Name: Erik Salazar MRN: [id number] Date of Birth: [birth year]

## 2021-09-21 NOTE — Patient Instructions (Signed)
Access Code: [facility] URL: [email or web address] Date: 09/21/2021 Prepared by: Glenetta Hew  Exercises Standing Balance in Corner - 3 x daily - 7 x weekly - 1 reps Standing Balance in Corner with Eyes Closed - 3 x daily - 7 x weekly - 1 reps Tandem Stance in Corner - 2 x daily - 7 x weekly - 1 sets - 2 reps - 30 sec hold Standing Hip Extension with Counter Support - 1 x daily - 7 x weekly - 2 sets - 10 reps Standing Hip Abduction with Counter Support - 1 x daily - 7 x weekly - 2 sets - 10 reps Mini Squat with Counter Support - 1 x daily - 7 x weekly - 2 sets - 10 reps Standing Single Leg Stance with Counter Support - 1 x daily - 7 x weekly - 1 sets - 2 reps - 30 sec hold

## 2021-09-22 ENCOUNTER — Encounter: Payer: Self-pay | Admitting: Family

## 2021-09-22 ENCOUNTER — Ambulatory Visit (INDEPENDENT_AMBULATORY_CARE_PROVIDER_SITE_OTHER): Payer: Medicare HMO | Admitting: Family

## 2021-09-22 VITALS — BP 150/90 | HR 91 | Temp 97.9°F | Ht 70.0 in | Wt 205.6 lb

## 2021-09-22 DIAGNOSIS — I700 Atherosclerosis of aorta: Secondary | ICD-10-CM | POA: Diagnosis not present

## 2021-09-22 DIAGNOSIS — R300 Dysuria: Secondary | ICD-10-CM

## 2021-09-22 DIAGNOSIS — I10 Essential (primary) hypertension: Secondary | ICD-10-CM

## 2021-09-22 DIAGNOSIS — E663 Overweight: Secondary | ICD-10-CM | POA: Diagnosis not present

## 2021-09-22 MED ORDER — NITROFURANTOIN MONOHYD MACRO 100 MG PO CAPS: 100.0000 mg | ORAL_CAPSULE | Freq: Two times a day (BID) | ORAL | 0 refills | Status: DC

## 2021-09-22 NOTE — Patient Instructions (Addendum)
[email or web address]   Please start checking your blood pressure 2x per day ( morning and afternoon) and send me the results in about 2 weeks

## 2021-09-22 NOTE — Progress Notes (Signed)
Erik Salazar is a 71 y.o. male with the following history as recorded in EpicCare:  Patient Active Problem List   Diagnosis Date Noted   Aortic atherosclerosis ([place]) 09/04/2021   Spondylolisthesis at L4-L5 level 06/28/2021   S/P repair of ventral hernia 05/19/2021   S/P repair of recurrent ventral hernia 05/19/2021   Benign prostatic hyperplasia without lower urinary tract symptoms 10/14/2020   Chronic pain 10/14/2020   ED (erectile dysfunction) of organic origin 10/14/2020   Esophageal dysphagia 10/14/2020   Gastroesophageal reflux disease 10/14/2020   History of aortic valve replacement with bioprosthetic valve 10/14/2020   Hx of aortic aneurysm repair 10/14/2020   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     aspirin EC 81 MG tablet Take 81 mg by mouth daily. Swallow whole.     Cholecalciferol (VITAMIN D3) 50 MCG (2000 UT) TABS Take 2,000 Units by mouth daily.     levETIRAcetam (KEPPRA) 1000 MG tablet Take 1 tablet (1,000 mg total) by mouth 2 (two) times daily. 180 tablet 4   metoprolol succinate (TOPROL-XL) 25 MG 24 hr tablet Take 0.5 tablets (12.5 mg total) by mouth daily. With or immediately following a meal. (Patient taking  differently: Take 12.5 mg by mouth at bedtime. With or immediately following a meal.) 45 tablet 3   nitrofurantoin, macrocrystal-monohydrate, (MACROBID) 100 MG capsule Take 1 capsule (100 mg total) by mouth 2 (two) times daily. 14 capsule 0   pravastatin (PRAVACHOL) 40 MG tablet Take 1 tablet (40 mg total) by mouth every evening. 90 tablet 3   pyridOXINE (VITAMIN B-6) 100 MG tablet Take 100 mg by mouth daily.     tamsulosin (FLOMAX) 0.4 MG CAPS capsule Take 0.8 mg by mouth every evening.     No current facility-administered medications for this visit.    Allergies: Oxycodone hcl, Zetia [ezetimibe], Augmentin [amoxicillin-pot clavulanate], Chlorhexidine, and Elemental sulfur  Past Medical History:  Diagnosis Date   Arthritis    Carpal tunnel syndrome on right 07/08/2017   Cervical radiculopathy at C6 03/50/0938   Right   Complication of anesthesia    Coronary artery disease    Enlarged prostate    H/O: knee surgery    15    History of blood clots    History of open heart surgery    ASCENDING AORTIC ANEURYSM   Ischemic stroke of frontal lobe ([place]) 03/14/2017   Bilateral; post-redo CT surgery   Pneumonia    PONV (postoperative nausea and vomiting)    only after CABG surgeries   Seizure disorder ([place]) 03/14/2017   Seizures ([place])    Status post knee surgery    DVT POST KNEE SURGERY   Stroke [facility])     Past Surgical History:  Procedure Laterality Date  ANTERIOR CERVICAL DECOMP/DISCECTOMY FUSION N/A 05/04/2020   Procedure: Anterior Cervical Decompression/Discectomy Fuion Cervical three-four, Cervical four-five, Cervical five-six;  Surgeon: Kary Kos, MD;  Location: [place];  Service: Neurosurgery;  Laterality: N/A;   BALLOON DILATION N/A 06/23/2019   Procedure: BALLOON DILATION;  Surgeon: Otis Brace, MD;  [facility];  Service: Gastroenterology;  Laterality: N/A;   BENTALL PROCEDURE  01/04/2016   Bentall with 23 mm pericardial AVR; SVG-LAD, SVG-CX ([place])   BICEPS TENDON REPAIR Right    BIOPSY  06/23/2019   Procedure: BIOPSY;  Surgeon: Otis Brace, MD;  [facility];  Service: Gastroenterology;;   CARDIAC SURGERY     ANUERSYM MAY 2017   Bentall procedure. Bioprosthetic aortic valve #23 mm bovine model #2700 TF ask, and 28 mm Gelweave woven vascular sinus of Valsalva graft   CARPAL TUNNEL RELEASE  08/2018   right hand    CORONARY ARTERY BYPASS GRAFT  01/04/2016   VG to LAD & VG to LCX   CORONARY ARTERY BYPASS GRAFT  03/13/2017   LIMA to LAD with steril abcess removal from dacron graft   ESOPHAGOGASTRODUODENOSCOPY     Had done dilatation done about 2 or 3 times before in [place]   ESOPHAGOGASTRODUODENOSCOPY (EGD) WITH PROPOFOL N/A 06/23/2019   Procedure: ESOPHAGOGASTRODUODENOSCOPY (EGD) WITH PROPOFOL;  Surgeon: Otis Brace, MD;  [facility];  Service: Gastroenterology;  Laterality: N/A;   FALSE ANEURYSM REPAIR  03/13/2017   redo sternotomy, sterile abscess removal from Dacron graft, omental flap around aorta, CABG: LIMA-LAD ([name], Dr. Mart Piggs)   [place]  2018   Of pericardium 2018   INSERTION OF MESH  10/14/2020   Procedure: INSERTION OF MESH;  Surgeon: Michael Boston, MD;  Location: [place];  Service: General;;   knee surgeries     13 surgeries on knee done before knee replacement    [place] N/A 05/19/2021   Procedure: LYSIS OF ADHESIONS;  Surgeon: Michael Boston, MD;  Location: [place];  Service: General;  Laterality: N/A;  GEN & LOCAL   LYSIS OF ADHESION N/A 10/14/2020   Procedure: LYSIS OF ADHESION;  Surgeon: Michael Boston, MD;  Location: [place];  Service: General;  Laterality: N/A;   open heart surgery     03-13-2017   REPLACEMENT TOTAL KNEE  2015   ROTATOR CUFF REPAIR     TONSILLECTOMY     removed as a child.   VENTRAL HERNIA REPAIR N/A 10/14/2020   Procedure: LAPAROSCOPIC VENTRAL HERNIA REPAIR WITH TAP BLOCK  BILATERAL;  Surgeon: Michael Boston, MD;  Location: [place];  Service: General;  Laterality: N/A;   VENTRAL HERNIA REPAIR N/A 05/19/2021   Procedure: LAPAROSCOPIC VENTRAL WALL HERNIA REPAIR;  Surgeon: Michael Boston, MD;  Location: [place];  Service: General;  Laterality: N/A;    Family History  Problem Relation Age of Onset   Arthritis Mother    Aneurysm Mother        brain aneurysm for mother.    Arthritis Father    Colon cancer Neg Hx    Esophageal cancer Neg Hx     Social History   Tobacco Use   Smoking status: Former    Types: Cigars   Smokeless tobacco: Never  Substance Use Topics   Alcohol use: Yes    Alcohol/week: 1.0 standard drink    Types: 1 Cans of beer per week    Comment: occassionally    Subjective:  Concerned for possible UTI; has been having some burning at the end of urination; no blood in urine; symptoms seemed to start after having to self- cath x 3-4 days;   Is surprised to see his blood pressure here today; at home averaging 130/72;  Would like to discuss weight loss options- does not want medication; needs to keep weight down to also help limit complications with hernia scars/ surgery;     Objective:  Vitals:   09/22/21 1128  BP: (!) 150/90  Pulse: 91  Temp: 97.9 F (36.6 C)  TempSrc: Oral  SpO2: 96%  Weight: 205 lb 9.6 oz (93.3 kg)  Height: 5\' 10"  (1.778 m)    General: Well developed, well nourished, in no acute distress  Skin : Warm and dry.  Head: Normocephalic and atraumatic  Lungs: Respirations unlabored;  Neurologic: Alert and oriented; speech intact; face symmetrical; moves all extremities well; CNII-XII intact without focal deficit   Assessment:  1. Overweight (BMI 25.0-29.9)   2. Essential hypertension   3. Aortic atherosclerosis ([place])   4. Dysuria     Plan:  Refer to [facility]; Continue to monitor twice a day and re-check in 2 weeks; 4.   Unable to give sample today; will go ahead and treat today; Rx for  Macrobid 100 mg bid x 7 days; plan to re-check sample in 2 weeks;  This visit occurred during the SARS-CoV-2 public health emergency.  Safety protocols were in place, including screening questions prior to the visit, additional usage of staff PPE, and extensive cleaning of exam room while observing appropriate contact time as indicated for disinfecting solutions.    Return in about 2 weeks (around 10/06/2021).  Orders Placed This Encounter  Procedures   Amb Ref to Medical Weight Management    Referral Priority:   Routine    Referral Type:   Consultation    Number of Visits Requested:   1    Requested Prescriptions   Signed Prescriptions Disp Refills   nitrofurantoin, macrocrystal-monohydrate, (MACROBID) 100 MG capsule 14 capsule 0    Sig: Take 1 capsule (100 mg total) by mouth 2 (two) times daily.

## 2021-09-25 ENCOUNTER — Other Ambulatory Visit: Payer: Medicare HMO

## 2021-09-25 ENCOUNTER — Ambulatory Visit: Payer: Medicare HMO | Admitting: Physical Therapy

## 2021-09-25 ENCOUNTER — Other Ambulatory Visit: Payer: Self-pay | Admitting: Family Medicine

## 2021-09-25 DIAGNOSIS — R300 Dysuria: Secondary | ICD-10-CM

## 2021-09-26 LAB — URINE CULTURE
MICRO NUMBER:: 12967816
Result:: NO GROWTH
SPECIMEN QUALITY:: ADEQUATE

## 2021-10-02 ENCOUNTER — Encounter: Payer: Self-pay | Admitting: Family Medicine

## 2021-10-02 ENCOUNTER — Ambulatory Visit (INDEPENDENT_AMBULATORY_CARE_PROVIDER_SITE_OTHER): Payer: Medicare HMO | Admitting: Family Medicine

## 2021-10-02 ENCOUNTER — Ambulatory Visit: Payer: Medicare HMO

## 2021-10-02 VITALS — BP 132/84 | HR 78 | Temp 97.8°F | Ht 69.0 in | Wt 202.5 lb

## 2021-10-02 DIAGNOSIS — J0140 Acute pansinusitis, unspecified: Secondary | ICD-10-CM | POA: Diagnosis not present

## 2021-10-02 MED ORDER — PREDNISONE 20 MG PO TABS
40.0000 mg | ORAL_TABLET | Freq: Every day | ORAL | 0 refills | Status: AC
Start: 2021-10-02 — End: 2021-10-07

## 2021-10-02 NOTE — Patient Instructions (Addendum)
Continue to push fluids, practice good hand hygiene, and cover your mouth if you cough.  If you start having fevers, shaking or shortness of breath, seek immediate care.  OK to take Tylenol 1000 mg (2 extra strength tabs) or 975 mg (3 regular strength tabs) every 6 hours as needed.  Send me a message in 2 days if no better.   Let us know if you need anything.

## 2021-10-02 NOTE — Progress Notes (Signed)
Chief Complaint  Patient presents with   Cough    Sinusitis     Erik Salazar here for URI complaints.  Duration: 4 days  Associated symptoms: Fever (100.2 F), sinus congestion, rhinorrhea, wheezing, chest tightness, myalgia, and cough Denies: itchy watery eyes, ear pain, ear drainage, sore throat, shortness of breath, and N/V Treatment to date: Tylenol, Coricidin  Sick contacts: No Tested - for covid.  Past Medical History:  Diagnosis Date   Arthritis    Carpal tunnel syndrome on right 07/08/2017   Cervical radiculopathy at C6 66/01/44   Right   Complication of anesthesia    Coronary artery disease    Enlarged prostate    H/O: knee surgery    15    History of blood clots    History of open heart surgery    ASCENDING AORTIC ANEURYSM   Ischemic stroke of frontal lobe ([place]) 03/14/2017   Bilateral; post-redo CT surgery   Pneumonia    PONV (postoperative nausea and vomiting)    only after CABG surgeries   Seizure disorder ([place]) 03/14/2017   Seizures ([employer])    Status post knee surgery    DVT POST KNEE SURGERY   Stroke ([facility])     Objective BP 132/84    Pulse 78    Temp 97.8 F (36.6 C) (Oral)    Ht 5\' 9"  (1.753 m)    Wt 202 lb 8 oz (91.9 kg)    SpO2 95%    BMI 29.90 kg/m  General: Awake, alert, appears stated age HEENT: AT, [place], ears patent b/l and TM's neg, nares patent w/o discharge, pharynx pink and without exudates, MMM, +ttp over max and frontal sinuses b/l Neck: No masses or asymmetry Heart: RRR Lungs: CTAB, no accessory muscle use Psych: Age appropriate judgment and insight, normal mood and affect  Acute pansinusitis, recurrence not specified - Plan: predniSONE (DELTASONE) 20 MG tablet  Reports doing a little better, will hold on abx. Send message in 2 d if no better. 5 d pred burst 40 mg/d. Continue to push fluids, practice good hand hygiene, cover mouth when coughing. F/u prn. If starting to experience fevers, shaking, or shortness of breath, seek immediate  care. Pt voiced understanding and agreement to the plan.  [place], DO 10/02/21 9:05 AM

## 2021-10-05 ENCOUNTER — Encounter: Payer: Medicare HMO | Admitting: Physical Therapy

## 2021-10-06 ENCOUNTER — Ambulatory Visit: Payer: Medicare HMO | Admitting: Family

## 2021-10-09 ENCOUNTER — Telehealth: Payer: Self-pay | Admitting: Family

## 2021-10-09 NOTE — Telephone Encounter (Signed)
Any other recommendations???

## 2021-10-09 NOTE — Telephone Encounter (Signed)
Pt states his referral to weight management will be 10 months and he would like a referral somewhere else. Please advise.

## 2021-10-10 ENCOUNTER — Telehealth: Payer: Self-pay | Admitting: Family

## 2021-10-10 NOTE — Telephone Encounter (Signed)
Pt states sinus infection has returned from prevs office visit 10/02/21 and would like [facility] to prescribe antibiotics.   Advised pt vv or in person may be needed for meds to be prescribed.   Please advise.

## 2021-10-10 NOTE — Telephone Encounter (Signed)
Is there anywhere you have been referring them out to?

## 2021-10-10 NOTE — Telephone Encounter (Signed)
Needs appointment

## 2021-10-11 NOTE — Telephone Encounter (Signed)
Left message on machine that we do not have any other recommendations and he can maybe get them to put him on cancellation list.

## 2021-10-12 ENCOUNTER — Ambulatory Visit: Payer: Medicare HMO | Admitting: Physical Therapy

## 2021-10-12 ENCOUNTER — Encounter: Payer: Self-pay | Admitting: Family Medicine

## 2021-10-12 ENCOUNTER — Ambulatory Visit (INDEPENDENT_AMBULATORY_CARE_PROVIDER_SITE_OTHER): Payer: Medicare HMO | Admitting: Family Medicine

## 2021-10-12 VITALS — BP 122/80 | HR 91 | Temp 98.1°F | Ht 70.0 in | Wt 202.2 lb

## 2021-10-12 DIAGNOSIS — J0140 Acute pansinusitis, unspecified: Secondary | ICD-10-CM

## 2021-10-12 MED ORDER — DOXYCYCLINE HYCLATE 100 MG PO TABS: 100.0000 mg | ORAL_TABLET | Freq: Two times a day (BID) | ORAL | 0 refills | Status: AC

## 2021-10-12 MED ORDER — METHYLPREDNISOLONE ACETATE 80 MG/ML IJ SUSP
80.0000 mg | Freq: Once | INTRAMUSCULAR | Status: AC
  Administered 2021-10-12: 80 mg via INTRAMUSCULAR

## 2021-10-12 NOTE — Patient Instructions (Signed)
Ice/cold pack over area for 10-15 min twice daily.  OK to take Tylenol 1000 mg (2 extra strength tabs) or 975 mg (3 regular strength tabs) every 6 hours as needed.  Let us know if you need anything.  

## 2021-10-12 NOTE — Addendum Note (Signed)
Addended by: Sharon Seller B on: 10/12/2021 07:59 AM   Modules accepted: Orders

## 2021-10-12 NOTE — Progress Notes (Signed)
Chief Complaint  Patient presents with   Sinusitis    Erik Salazar here for URI complaints.  Duration: 2 weeks  Associated symptoms: sinus headache, sinus congestion, sinus pain, and rhinorrhea Denies: itchy watery eyes, ear fullness, ear pain, ear drainage, sore throat, wheezing, shortness of breath, and fevers Treatment to date: Prednisone helped Sick contacts: No  Past Medical History:  Diagnosis Date   Arthritis    Carpal tunnel syndrome on right 07/08/2017   Cervical radiculopathy at C6 68/07/7516   Right   Complication of anesthesia    Coronary artery disease    Enlarged prostate    H/O: knee surgery    15    History of blood clots    History of open heart surgery    ASCENDING AORTIC ANEURYSM   Ischemic stroke of frontal lobe ([place]) 03/14/2017   Bilateral; post-redo CT surgery   Pneumonia    PONV (postoperative nausea and vomiting)    only after CABG surgeries   Seizure disorder ([place]) 03/14/2017   Seizures ([place])    Status post knee surgery    DVT POST KNEE SURGERY   Stroke ([facility])     Objective BP 122/80    Pulse 91    Temp 98.1 F (36.7 C) (Oral)    Ht 5\' 10"  (1.778 m)    Wt 202 lb 4 oz (91.7 kg)    SpO2 98%    BMI 29.02 kg/m  General: Awake, alert, appears stated age HEENT: AT, [place], ears patent b/l and TM's neg, nares patent w/o discharge, pharynx pink and without exudates, MMM, TTP over frontal and max sinuses Neck: No masses or asymmetry Heart: RRR Lungs: CTAB, no accessory muscle use Psych: Age appropriate judgment and insight, normal mood and affect  Acute pansinusitis, recurrence not specified - Plan: doxycycline (VIBRA-TABS) 100 MG tablet  7 d of doxy. Rxn to Augmentin. Depo injection today to help w s/s's. Cont Tylenol prn.  Continue to push fluids, practice good hand hygiene, cover mouth when coughing. F/u prn. If starting to experience fevers, shaking, or shortness of breath, seek immediate care. Pt voiced understanding and agreement to the  plan.  [place], DO 10/12/21 7:54 AM

## 2021-10-16 ENCOUNTER — Ambulatory Visit: Payer: Medicare HMO | Admitting: Physical Therapy

## 2021-10-18 ENCOUNTER — Ambulatory Visit: Payer: Medicare HMO

## 2021-10-20 ENCOUNTER — Ambulatory Visit (INDEPENDENT_AMBULATORY_CARE_PROVIDER_SITE_OTHER): Payer: Medicare HMO | Admitting: Family

## 2021-10-20 ENCOUNTER — Other Ambulatory Visit (HOSPITAL_BASED_OUTPATIENT_CLINIC_OR_DEPARTMENT_OTHER): Payer: Self-pay

## 2021-10-20 ENCOUNTER — Encounter: Payer: Self-pay | Admitting: Family

## 2021-10-20 VITALS — BP 130/80 | HR 92 | Temp 98.3°F | Ht 70.0 in | Wt 204.2 lb

## 2021-10-20 DIAGNOSIS — J0140 Acute pansinusitis, unspecified: Secondary | ICD-10-CM | POA: Diagnosis not present

## 2021-10-20 MED ORDER — LEVOFLOXACIN 500 MG PO TABS
500.0000 mg | ORAL_TABLET | Freq: Every day | ORAL | 0 refills | Status: AC
  Filled 2021-10-20: qty 7, 7d supply, fill #0

## 2021-10-20 MED ORDER — LEVOFLOXACIN 500 MG PO TABS: 500.0000 mg | ORAL_TABLET | Freq: Every day | ORAL | 0 refills | Status: DC

## 2021-10-20 NOTE — Progress Notes (Signed)
Erik Salazar is a 71 y.o. 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/14/2020   Esophageal dysphagia 10/14/2020   Gastroesophageal reflux disease 10/14/2020   History of aortic valve replacement with bioprosthetic valve 10/14/2020   Hx of aortic aneurysm repair 10/14/2020   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   Thoracic aortic aneurysm without rupture 10/14/2020   Recurrent incisional hernia 10/14/2020   Spinal stenosis in cervical region 05/04/2020   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   Cervical radiculopathy at C6 11/14/2017   Carpal tunnel syndrome on right 07/08/2017   Tremor, essential 07/08/2017   Ischemic stroke of frontal lobe ([place]) 04/05/2017   Pain in right hand 04/05/2017   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 03/27/2017   Seizure ([place]) 03/14/2017   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     aspirin EC 81 MG tablet Take 81 mg by mouth daily. Swallow whole.     Cholecalciferol (VITAMIN D3) 50 MCG (2000 UT) TABS Take 2,000 Units by mouth daily.     levETIRAcetam (KEPPRA) 1000 MG tablet Take 1 tablet (1,000 mg total) by mouth 2 (two) times daily. 180 tablet 4   metoprolol succinate (TOPROL-XL) 25 MG 24 hr tablet Take 0.5 tablets (12.5 mg total) by mouth daily. With or immediately following a meal. (Patient taking  differently: Take 12.5 mg by mouth at bedtime. With or immediately following a meal.) 45 tablet 3   pravastatin (PRAVACHOL) 40 MG tablet Take 1 tablet (40 mg total) by mouth every evening. 90 tablet 3   pyridOXINE (VITAMIN B-6) 100 MG tablet Take 100 mg by mouth daily.     tamsulosin (FLOMAX) 0.4 MG CAPS capsule Take 0.8 mg by mouth every evening.     levofloxacin (LEVAQUIN) 500 MG tablet Take 1 tablet (500 mg total) by mouth daily for 7 days. 7 tablet 0   No current facility-administered medications for this visit.    Allergies: Oxycodone hcl, Zetia [ezetimibe], Augmentin [amoxicillin-pot clavulanate], Chlorhexidine, and Elemental sulfur  Past Medical History:  Diagnosis Date   Arthritis    Carpal tunnel syndrome on right 07/08/2017   Cervical radiculopathy at C6 40/98/1191   Right   Complication of anesthesia    Coronary artery disease    Enlarged prostate    H/O: knee surgery    15    History of blood clots    History of open heart surgery    ASCENDING AORTIC ANEURYSM   Ischemic stroke of frontal lobe ([place]) 03/14/2017   Bilateral; post-redo CT surgery   Pneumonia    PONV (postoperative nausea and vomiting)    only after CABG surgeries   Seizure disorder ([place]) 03/14/2017   Seizures ([place])    Status post knee surgery    DVT POST KNEE SURGERY   Stroke [facility])     Past Surgical History:  Procedure Laterality Date  ANTERIOR CERVICAL DECOMP/DISCECTOMY FUSION N/A 05/04/2020   Procedure: Anterior Cervical Decompression/Discectomy Fuion Cervical three-four, Cervical four-five, Cervical five-six;  Surgeon: Kary Kos, MD;  Location: [place];  Service: Neurosurgery;  Laterality: N/A;   BALLOON DILATION N/A 06/23/2019   Procedure: BALLOON DILATION;  Surgeon: Otis Brace, MD;  [facility];  Service: Gastroenterology;  Laterality: N/A;   BENTALL PROCEDURE  01/04/2016   Bentall with 23 mm pericardial AVR; SVG-LAD, SVG-CX ([place])   BICEPS TENDON  REPAIR Right    BIOPSY  06/23/2019   Procedure: BIOPSY;  Surgeon: Otis Brace, MD;  [facility];  Service: Gastroenterology;;   CARDIAC SURGERY     ANUERSYM MAY 2017   Bentall procedure. Bioprosthetic aortic valve #23 mm bovine model #2700 TF ask, and 28 mm Gelweave woven vascular sinus of Valsalva graft   CARPAL TUNNEL RELEASE  08/2018   right hand    CORONARY ARTERY BYPASS GRAFT  01/04/2016   VG to LAD & VG to LCX   CORONARY ARTERY BYPASS GRAFT  03/13/2017   LIMA to LAD with steril abcess removal from dacron graft   ESOPHAGOGASTRODUODENOSCOPY     Had done dilatation done about 2 or 3 times before in [place]   ESOPHAGOGASTRODUODENOSCOPY (EGD) WITH PROPOFOL N/A 06/23/2019   Procedure: ESOPHAGOGASTRODUODENOSCOPY (EGD) WITH PROPOFOL;  Surgeon: Otis Brace, MD;  [facility];  Service: Gastroenterology;  Laterality: N/A;   FALSE ANEURYSM REPAIR  03/13/2017   redo sternotomy, sterile abscess removal from Dacron graft, omental flap around aorta, CABG: LIMA-LAD ([name], Dr. Mart Piggs)   [place]  2018   Of pericardium 2018   INSERTION OF MESH  10/14/2020   Procedure: INSERTION OF MESH;  Surgeon: Michael Boston, MD;  Location: [place];  Service: General;;   knee surgeries     13 surgeries on knee done before knee replacement    [place] N/A 05/19/2021   Procedure: LYSIS OF ADHESIONS;  Surgeon: Michael Boston, MD;  Location: [place];  Service: General;  Laterality: N/A;  GEN & LOCAL   LYSIS OF ADHESION N/A 10/14/2020   Procedure: LYSIS OF ADHESION;  Surgeon: Michael Boston, MD;  Location: [place];  Service: General;  Laterality: N/A;   open heart surgery     03-13-2017   REPLACEMENT TOTAL KNEE  2015   ROTATOR CUFF REPAIR     TONSILLECTOMY     removed as a child.   VENTRAL HERNIA REPAIR N/A 10/14/2020   Procedure: LAPAROSCOPIC VENTRAL HERNIA REPAIR WITH TAP BLOCK BILATERAL;  Surgeon: Michael Boston, MD;   Location: [place];  Service: General;  Laterality: N/A;   VENTRAL HERNIA REPAIR N/A 05/19/2021   Procedure: LAPAROSCOPIC VENTRAL WALL HERNIA REPAIR;  Surgeon: Michael Boston, MD;  Location: [place];  Service: General;  Laterality: N/A;    Family History  Problem Relation Age of Onset   Arthritis Mother    Aneurysm Mother        brain aneurysm for mother.    Arthritis Father    Colon cancer Neg Hx    Esophageal cancer Neg Hx     Social History   Tobacco Use   Smoking status: Former    Types: Cigars   Smokeless tobacco: Never  Substance Use Topics   Alcohol use: Yes    Alcohol/week: 1.0 standard drink    Types: 1 Cans of beer per week    Comment: occassionally    Subjective:  Patient was seen on 2/13 and 2/23 with concerns for acute sinusitis; has been treated with prednisone and 7 day course of Doxycycline; continuing to have facial pain/ pressure; + teeth pressure; no fever- ? Fever at night; no chest pain, no shortness of breath;     Objective:  Vitals:   10/20/21 0819  BP: 130/80  Pulse: 92  Temp: 98.3 F (36.8 C)  SpO2: 95%  Weight: 204 lb 3.2 oz (92.6 kg)  Height: 5\' 10"  (1.778 m)    General: Well developed, well nourished, in no acute distress  Skin : Warm and dry.  Head: Normocephalic and atraumatic  Eyes: Sclera and conjunctiva clear; pupils round and reactive to light; extraocular movements intact  Ears: External normal; canals clear; tympanic membranes normal  Oropharynx: Pink, supple. No suspicious lesions  Neck: Supple without thyromegaly, adenopathy  Lungs: Respirations unlabored;  Neurologic: Alert and oriented; speech intact; face symmetrical; moves all extremities well; CNII-XII intact without focal deficit   Assessment:  1. Acute pansinusitis, recurrence not specified     Plan:  Rx for Levaquin 500 mg qd x 7 days; increase fluids, rest and follow up worse, no better.   This visit occurred during the SARS-CoV-2 public health emergency.  Safety  protocols were in place, including screening questions prior to the visit, additional usage of staff PPE, and extensive cleaning of exam room while observing appropriate contact time as indicated for disinfecting solutions.    No follow-ups on file.  No orders of the defined types were placed in this encounter.   Requested Prescriptions   Signed Prescriptions Disp Refills   levofloxacin (LEVAQUIN) 500 MG tablet 7 tablet 0    Sig: Take 1 tablet (500 mg total) by mouth daily for 7 days.

## 2021-10-20 NOTE — Patient Instructions (Signed)
Okay to use OTC Loratidine 10 mg daily; ?

## 2021-10-24 ENCOUNTER — Encounter: Payer: Medicare HMO | Admitting: Physical Therapy

## 2021-10-30 ENCOUNTER — Ambulatory Visit: Payer: Medicare HMO | Admitting: Physical Therapy

## 2021-11-14 ENCOUNTER — Other Ambulatory Visit (HOSPITAL_BASED_OUTPATIENT_CLINIC_OR_DEPARTMENT_OTHER): Payer: Self-pay

## 2021-11-14 ENCOUNTER — Ambulatory Visit (INDEPENDENT_AMBULATORY_CARE_PROVIDER_SITE_OTHER): Payer: Medicare HMO | Admitting: Medical

## 2021-11-14 VITALS — BP 136/79 | HR 70 | Temp 97.7°F | Resp 18 | Ht 70.0 in | Wt 203.0 lb

## 2021-11-14 DIAGNOSIS — J0140 Acute pansinusitis, unspecified: Secondary | ICD-10-CM

## 2021-11-14 MED ORDER — LEVOCETIRIZINE DIHYDROCHLORIDE 5 MG PO TABS
5.0000 mg | ORAL_TABLET | Freq: Every evening | ORAL | 5 refills | Status: DC
  Filled 2021-11-14: qty 30, 30d supply, fill #0

## 2021-11-14 MED ORDER — PREDNISONE 10 MG (21) PO TBPK: ORAL_TABLET | ORAL | 0 refills | Status: DC

## 2021-11-14 MED ORDER — PREDNISONE 10 MG PO TABS
ORAL_TABLET | ORAL | 0 refills | Status: DC
  Filled 2021-11-14: qty 21, 6d supply, fill #0

## 2021-11-14 MED ORDER — LEVOCETIRIZINE DIHYDROCHLORIDE 5 MG PO TABS: 5.0000 mg | ORAL_TABLET | Freq: Every evening | ORAL | 5 refills | Status: DC

## 2021-11-14 NOTE — Patient Instructions (Signed)
Persistent sinus pressure. Likely allergic rhinitis with potential persisting sinus infection. Will rx 6 day taper prednisone and xyzal anthistamine. Will refer to ENT office. Asking for appt in 1-2 weeks. If you have worse symptoms pending referral let me know. In that event might add 3rd antibiotic. If can get quick appt with ENT suspect will get ct sinus and be placed on antibiotic. ? ?Follow up date in 7-10 days or sooner if needed.(If doing well my chart update would work) ?

## 2021-11-14 NOTE — Progress Notes (Signed)
? ?Subjective:  ? ? Patient ID: Erik Salazar, male    DOB: [birth year], 71 y.o.   MRN: [id number] ? ?HPI ? ? ?Pt in for sinus pressure/ha. Pt has had 3 visits since 10/02/2021. ? ?Pt describes that with each treatment regimen he will get better but then 3-4 days later symptoms will reoccur will get frontal sinus pressure. ? ?1st tx got 5 d pred burst 40 mg/d. ? ?2nd visit-   ?7 d of doxy. Rxn to Augmentin. Depo injection today to help w s/s's. Cont Tylenol prn.  ? ?3rd visit-  Rx for Levaquin 500 mg qd x 7 days pt also used loratadine and did help. ? ?Pt is getting clear mucus when blows nose. Does feel nasal congested. ? ? ?Over the years he had used claritin from spring to summer in past. ? ? ?Review of Systems  ?Constitutional:  Negative for chills, fatigue and fever.  ?HENT:  Positive for congestion, sinus pressure and sinus pain. Negative for postnasal drip, sneezing and sore throat.   ?Respiratory:  Negative for cough, chest tightness, shortness of breath and wheezing.   ?Cardiovascular:  Negative for chest pain and palpitations.  ?Gastrointestinal:  Negative for abdominal pain.  ?Musculoskeletal:  Negative for back pain.  ?Neurological:  Negative for dizziness and headaches.  ?Hematological:  Negative for adenopathy. Does not bruise/bleed easily.  ?Psychiatric/Behavioral:  Negative for behavioral problems and confusion.   ? ? ?Past Medical History:  ?Diagnosis Date  ? Arthritis   ? Carpal tunnel syndrome on right 07/08/2017  ? Cervical radiculopathy at C6 11/14/2017  ? Right  ? Complication of anesthesia   ? Coronary artery disease   ? Enlarged prostate   ? H/O: knee surgery   ? 15   ? History of blood clots   ? History of open heart surgery   ? ASCENDING AORTIC ANEURYSM  ? Ischemic stroke of frontal lobe ([place]) 03/14/2017  ? Bilateral; post-redo CT surgery  ? Pneumonia   ? PONV (postoperative nausea and vomiting)   ? only after CABG surgeries  ? Seizure disorder ([place]) 03/14/2017  ? Seizures ([place])   ? Status post  knee surgery   ? DVT POST KNEE SURGERY  ? Stroke [facility])   ? ?  ?Social History  ? ?Socioeconomic History  ? Marital status: Married  ?  Spouse name: Not on file  ? Number of children: Not on file  ? Years of education: Not on file  ? Highest education level: Not on file  ?Occupational History  ? Occupation: RETIRED  ?Tobacco Use  ? Smoking status: Former  ?  Types: Cigars  ? Smokeless tobacco: Never  ?Vaping Use  ? Vaping Use: Never used  ?Substance and Sexual Activity  ? Alcohol use: Yes  ?  Alcohol/week: 1.0 standard drink  ?  Types: 1 Cans of beer per week  ?  Comment: occassionally  ? Drug use: No  ? Sexual activity: Not on file  ?Other Topics Concern  ? Not on file  ?Social History Narrative  ? Lives at home with wife, is retired.  Education: 2 yrs college.   2 Children.  ? ?Social Determinants of Health  ? ?Financial Resource Strain: Not on file  ?Food Insecurity: Not on file  ?Transportation Needs: Not on file  ?Physical Activity: Not on file  ?Stress: Not on file  ?Social Connections: Not on file  ?Intimate Partner Violence: Not on file  ? ? ?Past Surgical History:  ?Procedure Laterality Date  ?  ANTERIOR CERVICAL DECOMP/DISCECTOMY FUSION N/A 05/04/2020  ? Procedure: Anterior Cervical Decompression/Discectomy Fuion Cervical three-four, Cervical four-five, Cervical five-six;  Surgeon: Kary Kos, MD;  Location: [place];  Service: Neurosurgery;  Laterality: N/A;  ? BALLOON DILATION N/A 06/23/2019  ? Procedure: BALLOON DILATION;  Surgeon: Otis Brace, MD;  [facility];  Service: Gastroenterology;  Laterality: N/A;  ? BENTALL PROCEDURE  01/04/2016  ? Bentall with 23 mm pericardial AVR; SVG-LAD, SVG-CX ([place])  ? BICEPS TENDON REPAIR Right   ? BIOPSY  06/23/2019  ? Procedure: BIOPSY;  Surgeon: Otis Brace, MD;  [facility];  Service: Gastroenterology;;  ? CARDIAC SURGERY    ? ANUERSYM MAY 2017   Bentall procedure. Bioprosthetic aortic valve #23 mm bovine model  #2700 TF ask, and 28 mm Gelweave woven vascular sinus of Valsalva graft  ? CARPAL TUNNEL RELEASE  08/2018  ? right hand   ? CORONARY ARTERY BYPASS GRAFT  01/04/2016  ? VG to LAD & VG to LCX  ? CORONARY ARTERY BYPASS GRAFT  03/13/2017  ? LIMA to LAD with steril abcess removal from dacron graft  ? ESOPHAGOGASTRODUODENOSCOPY    ? Had done dilatation done about 2 or 3 times before in [place]  ? ESOPHAGOGASTRODUODENOSCOPY (EGD) WITH PROPOFOL N/A 06/23/2019  ? Procedure: ESOPHAGOGASTRODUODENOSCOPY (EGD) WITH PROPOFOL;  Surgeon: Otis Brace, MD;  [facility];  Service: Gastroenterology;  Laterality: N/A;  ? FALSE ANEURYSM REPAIR  03/13/2017  ? redo sternotomy, sterile abscess removal from Dacron graft, omental flap around aorta, CABG: LIMA-LAD ([name], Dr. Mart Piggs)  ? GREATER OMENTAL FLAP CLOSURE  2018  ? Of pericardium 2018  ? INSERTION OF MESH  10/14/2020  ? Procedure: INSERTION OF MESH;  Surgeon: Michael Boston, MD;  Location: [place];  Service: General;;  ? knee surgeries    ? 13 surgeries on knee done before knee replacement   ? KNEE SURGERY  1983  ? LAPAROSCOPIC LYSIS OF ADHESIONS N/A 05/19/2021  ? Procedure: LYSIS OF ADHESIONS;  Surgeon: Michael Boston, MD;  Location: [place];  Service: General;  Laterality: N/A;  GEN & LOCAL  ? LYSIS OF ADHESION N/A 10/14/2020  ? Procedure: LYSIS OF ADHESION;  Surgeon: Michael Boston, MD;  Location: [place];  Service: General;  Laterality: N/A;  ? open heart surgery    ? 03-13-2017  ? REPLACEMENT TOTAL KNEE  2015  ? ROTATOR CUFF REPAIR    ? TONSILLECTOMY    ? removed as a child.  ? VENTRAL HERNIA REPAIR N/A 10/14/2020  ? Procedure: LAPAROSCOPIC VENTRAL HERNIA REPAIR WITH TAP BLOCK BILATERAL;  Surgeon: Michael Boston, MD;  Location: [place];  Service: General;  Laterality: N/A;  ? VENTRAL HERNIA REPAIR N/A 05/19/2021  ? Procedure: LAPAROSCOPIC VENTRAL WALL HERNIA REPAIR;  Surgeon: Michael Boston, MD;  Location: [place];  Service: General;  Laterality: N/A;  ? ? ?Family History   ?Problem Relation Age of Onset  ? Arthritis Mother   ? Aneurysm Mother   ?     brain aneurysm for mother.   ? Arthritis Father   ? Colon cancer Neg Hx   ? Esophageal cancer Neg Hx   ? ? ?Allergies  ?Allergen Reactions  ? Oxycodone Hcl Other (See Comments)  ?  DELUSIONAL  ? Zetia [Ezetimibe] Other (See Comments)  ?  Leg cramps  ? Augmentin [Amoxicillin-Pot Clavulanate] Nausea Only  ?  Dizzy  ? Chlorhexidine Rash  ? Elemental Sulfur Rash  ? ? ?Current Outpatient Medications on File Prior  to Visit  ?Medication Sig Dispense Refill  ? ascorbic acid (VITAMIN C) 500 MG tablet Take 500 mg by mouth daily.    ? aspirin EC 81 MG tablet Take 81 mg by mouth daily. Swallow whole.    ? Cholecalciferol (VITAMIN D3) 50 MCG (2000 UT) TABS Take 2,000 Units by mouth daily.    ? levETIRAcetam (KEPPRA) 1000 MG tablet Take 1 tablet (1,000 mg total) by mouth 2 (two) times daily. 180 tablet 4  ? metoprolol succinate (TOPROL-XL) 25 MG 24 hr tablet Take 0.5 tablets (12.5 mg total) by mouth daily. With or immediately following a meal. (Patient taking differently: Take 12.5 mg by mouth at bedtime. With or immediately following a meal.) 45 tablet 3  ? pravastatin (PRAVACHOL) 40 MG tablet Take 1 tablet (40 mg total) by mouth every evening. 90 tablet 3  ? pyridOXINE (VITAMIN B-6) 100 MG tablet Take 100 mg by mouth daily.    ? tamsulosin (FLOMAX) 0.4 MG CAPS capsule Take 0.8 mg by mouth every evening.    ? ?No current facility-administered medications on file prior to visit.  ? ? ?BP 136/79   Pulse 70   Temp 97.7 ?F (36.5 ?C)   Resp 18   Ht '5\' 10"'$  (1.778 m)   Wt 203 lb (92.1 kg)   SpO2 100%   BMI 29.13 kg/m?  ?  ?   ?Objective:  ? Physical Exam ? ?General ?Mental Status- Alert. General Appearance- Not in acute distress.  ? ?Skin ?General: Color- Normal Color. Moisture- Normal Moisture. ? ?Neck ?Carotid Arteries- Normal color. Moisture- Normal Moisture. No carotid bruits. No JVD. ? ?Chest and Lung Exam ?Auscultation: ?Breath  Sounds:-Normal. ? ?Cardiovascular ?Auscultation:Rythm- Regular. ?Murmurs & Other Heart Sounds:Auscultation of the heart reveals- No Murmurs. ? ?Abdomen ?Inspection:-Inspeection Normal. ?Palpation/Percussion:Note:No mass. Palpa

## 2021-11-15 ENCOUNTER — Other Ambulatory Visit (HOSPITAL_BASED_OUTPATIENT_CLINIC_OR_DEPARTMENT_OTHER): Payer: Self-pay

## 2021-11-15 ENCOUNTER — Encounter: Payer: Self-pay | Admitting: Cardiology

## 2021-11-15 MED ORDER — SUTAB 1479-225-188 MG PO TABS: ORAL_TABLET | ORAL | 0 refills | Status: DC

## 2021-11-15 MED ORDER — SILDENAFIL CITRATE 20 MG PO TABS
ORAL_TABLET | ORAL | 1 refills | Status: DC
  Filled 2022-03-09: qty 30, 30d supply, fill #0

## 2021-11-15 MED ORDER — LEVOFLOXACIN 500 MG PO TABS: 500.0000 mg | ORAL_TABLET | Freq: Every day | ORAL | 0 refills | Status: DC

## 2021-11-15 MED ORDER — TAMSULOSIN HCL 0.4 MG PO CAPS
ORAL_CAPSULE | ORAL | 1 refills | Status: DC
  Filled 2021-11-15: qty 180, 90d supply, fill #0
  Filled 2022-02-09: qty 180, 90d supply, fill #1

## 2021-11-16 ENCOUNTER — Other Ambulatory Visit (HOSPITAL_BASED_OUTPATIENT_CLINIC_OR_DEPARTMENT_OTHER): Payer: Self-pay

## 2021-11-17 ENCOUNTER — Telehealth: Payer: Self-pay | Admitting: Family

## 2021-11-17 ENCOUNTER — Telehealth (INDEPENDENT_AMBULATORY_CARE_PROVIDER_SITE_OTHER): Payer: Medicare HMO | Admitting: Family Medicine

## 2021-11-17 ENCOUNTER — Other Ambulatory Visit (HOSPITAL_BASED_OUTPATIENT_CLINIC_OR_DEPARTMENT_OTHER): Payer: Self-pay

## 2021-11-17 ENCOUNTER — Encounter: Payer: Self-pay | Admitting: Family Medicine

## 2021-11-17 DIAGNOSIS — T380X5A Adverse effect of glucocorticoids and synthetic analogues, initial encounter: Secondary | ICD-10-CM | POA: Diagnosis not present

## 2021-11-17 DIAGNOSIS — F19982 Other psychoactive substance use, unspecified with psychoactive substance-induced sleep disorder: Secondary | ICD-10-CM | POA: Diagnosis not present

## 2021-11-17 DIAGNOSIS — R61 Generalized hyperhidrosis: Secondary | ICD-10-CM

## 2021-11-17 DIAGNOSIS — J329 Chronic sinusitis, unspecified: Secondary | ICD-10-CM | POA: Diagnosis not present

## 2021-11-17 NOTE — Telephone Encounter (Signed)
Pt was seen by Percell Miller on Tuesday for sinus issues. He was given prednisone and and levocetirizine. He is now experiencing extreme back and hip pain along with night sweats and insomnia. He is worried this may be because of the prednisone. No abdominal pain or any other sxs at this time. He wanted to speak with a nurse so he was transferred to triage to see advice. Please advise.  ?

## 2021-11-17 NOTE — Progress Notes (Signed)
Virtual Visit via Video Note ? ?I connected with Erik Salazar on 11/17/21 at  4:00 PM EDT by a video enabled telemedicine application 2/2 [id number] pandemic and verified that I am speaking with the correct person using two identifiers. ? Location patient: home ?Location provider:work or home office ?Persons participating in the virtual visit: patient, provider.  A family member seen off to the side of video after visit completion. ? ?I discussed the limitations of evaluation and management by telemedicine and the availability of in person appointments. The patient expressed understanding and agreed to proceed. ? ?Chief Complaint  ?Patient presents with  ? Pain  ?  Hip/back pain since sinus infection medication was given. Was given Xyzal and prednisone, is still taking both .  ? ? ?HPI: ?Pt seen by several different providers over the last 2 months for sinusitis.  Seen 10/02/21 for acute pansinusitis, given 5 d pred burst, advised to contact clinic in 2 days for abx for continued symptoms per note.  Called 10/10/21, advised appt needed.  Seen 10/12/21 for acute pansinusitis, given doxycycline and Depo-Medrol 80 mg IM.  Seen 10/20/21 for same issue, give Levaquin x 10 d.  Seen again 11/14/21 given 6 day prednisone taper, xyzal, and an ENT referral. ? ?Pt endorses night sweats, increased energy, insomnia, low back pain, L hip pain.  Symptoms started after a few days of taking the steroid taper.  No longer on antibiotics.  States sinus symptoms improved some when on steroids but has been returning shortly after stopping medication.  Currently taking Xyzal and using sinus rinse/nasal spray. ? ?Pt has an appt with ENT but it is not until 4/28. ? ?ROS: See pertinent positives and negatives per HPI. ? ?Past Medical History:  ?Diagnosis Date  ? Arthritis   ? Carpal tunnel syndrome on right 07/08/2017  ? Cervical radiculopathy at C6 11/14/2017  ? Right  ? Complication of anesthesia   ? Coronary artery disease   ? Enlarged prostate    ? H/O: knee surgery   ? 15   ? History of blood clots   ? History of open heart surgery   ? ASCENDING AORTIC ANEURYSM  ? Ischemic stroke of frontal lobe ([place]) 03/14/2017  ? Bilateral; post-redo CT surgery  ? Pneumonia   ? PONV (postoperative nausea and vomiting)   ? only after CABG surgeries  ? Seizure disorder ([place]) 03/14/2017  ? Seizures ([place])   ? Status post knee surgery   ? DVT POST KNEE SURGERY  ? Stroke [facility])   ? ? ?Past Surgical History:  ?Procedure Laterality Date  ? ANTERIOR CERVICAL DECOMP/DISCECTOMY FUSION N/A 05/04/2020  ? Procedure: Anterior Cervical Decompression/Discectomy Fuion Cervical three-four, Cervical four-five, Cervical five-six;  Surgeon: Kary Kos, MD;  Location: [place];  Service: Neurosurgery;  Laterality: N/A;  ? BALLOON DILATION N/A 06/23/2019  ? Procedure: BALLOON DILATION;  Surgeon: Otis Brace, MD;  [facility];  Service: Gastroenterology;  Laterality: N/A;  ? BENTALL PROCEDURE  01/04/2016  ? Bentall with 23 mm pericardial AVR; SVG-LAD, SVG-CX ([place])  ? BICEPS TENDON REPAIR Right   ? BIOPSY  06/23/2019  ? Procedure: BIOPSY;  Surgeon: Otis Brace, MD;  [facility];  Service: Gastroenterology;;  ? CARDIAC SURGERY    ? ANUERSYM MAY 2017   Bentall procedure. Bioprosthetic aortic valve #23 mm bovine model #2700 TF ask, and 28 mm Gelweave woven vascular sinus of Valsalva graft  ? CARPAL TUNNEL RELEASE  08/2018  ? right hand   ?  CORONARY ARTERY BYPASS GRAFT  01/04/2016  ? VG to LAD & VG to LCX  ? CORONARY ARTERY BYPASS GRAFT  03/13/2017  ? LIMA to LAD with steril abcess removal from dacron graft  ? ESOPHAGOGASTRODUODENOSCOPY    ? Had done dilatation done about 2 or 3 times before in [place]  ? ESOPHAGOGASTRODUODENOSCOPY (EGD) WITH PROPOFOL N/A 06/23/2019  ? Procedure: ESOPHAGOGASTRODUODENOSCOPY (EGD) WITH PROPOFOL;  Surgeon: Otis Brace, MD;  [facility];  Service: Gastroenterology;  Laterality: N/A;  ? FALSE ANEURYSM  REPAIR  03/13/2017  ? redo sternotomy, sterile abscess removal from Dacron graft, omental flap around aorta, CABG: LIMA-LAD ([name], Dr. Mart Piggs)  ? GREATER OMENTAL FLAP CLOSURE  2018  ? Of pericardium 2018  ? INSERTION OF MESH  10/14/2020  ? Procedure: INSERTION OF MESH;  Surgeon: Michael Boston, MD;  Location: [place];  Service: General;;  ? knee surgeries    ? 13 surgeries on knee done before knee replacement   ? KNEE SURGERY  1983  ? LAPAROSCOPIC LYSIS OF ADHESIONS N/A 05/19/2021  ? Procedure: LYSIS OF ADHESIONS;  Surgeon: Michael Boston, MD;  Location: [place];  Service: General;  Laterality: N/A;  GEN & LOCAL  ? LYSIS OF ADHESION N/A 10/14/2020  ? Procedure: LYSIS OF ADHESION;  Surgeon: Michael Boston, MD;  Location: [place];  Service: General;  Laterality: N/A;  ? open heart surgery    ? 03-13-2017  ? REPLACEMENT TOTAL KNEE  2015  ? ROTATOR CUFF REPAIR    ? TONSILLECTOMY    ? removed as a child.  ? VENTRAL HERNIA REPAIR N/A 10/14/2020  ? Procedure: LAPAROSCOPIC VENTRAL HERNIA REPAIR WITH TAP BLOCK BILATERAL;  Surgeon: Michael Boston, MD;  Location: [place];  Service: General;  Laterality: N/A;  ? VENTRAL HERNIA REPAIR N/A 05/19/2021  ? Procedure: LAPAROSCOPIC VENTRAL WALL HERNIA REPAIR;  Surgeon: Michael Boston, MD;  Location: [place];  Service: General;  Laterality: N/A;  ? ? ?Family History  ?Problem Relation Age of Onset  ? Arthritis Mother   ? Aneurysm Mother   ?     brain aneurysm for mother.   ? Arthritis Father   ? Colon cancer Neg Hx   ? Esophageal cancer Neg Hx   ? ? ? ?Current Outpatient Medications:  ?  ascorbic acid (VITAMIN C) 500 MG tablet, Take 500 mg by mouth daily., Disp: , Rfl:  ?  aspirin EC 81 MG tablet, Take 81 mg by mouth daily. Swallow whole., Disp: , Rfl:  ?  Cholecalciferol (VITAMIN D3) 50 MCG (2000 UT) TABS, Take 2,000 Units by mouth daily., Disp: , Rfl:  ?  levETIRAcetam (KEPPRA) 1000 MG tablet, Take 1 tablet (1,000 mg total) by mouth 2 (two) times daily., Disp: 180 tablet, Rfl: 4 ?   levocetirizine (XYZAL) 5 MG tablet, Take 1 tablet (5 mg total) by mouth every evening., Disp: 30 tablet, Rfl: 5 ?  levofloxacin (LEVAQUIN) 500 MG tablet, Take 1 tablet (500 mg total) by mouth daily for 7 days., Disp: 7 tablet, Rfl: 0 ?  metoprolol succinate (TOPROL-XL) 25 MG 24 hr tablet, Take 0.5 tablets (12.5 mg total) by mouth daily. With or immediately following a meal. (Patient taking differently: Take 12.5 mg by mouth at bedtime. With or immediately following a meal.), Disp: 45 tablet, Rfl: 3 ?  pravastatin (PRAVACHOL) 40 MG tablet, Take 1 tablet (40 mg total) by mouth every evening., Disp: 90 tablet, Rfl: 3 ?  predniSONE (DELTASONE) 10 MG tablet, Take 6 tablets by mouth  on day 1, then 5 tablets on day 2, then 4 tablets on day 3, then 3 tablets on day 4, then 2 tablets on day 5, then 1 tablet on day 6., Disp: 21 tablet, Rfl: 0 ?  pyridOXINE (VITAMIN B-6) 100 MG tablet, Take 100 mg by mouth daily., Disp: , Rfl:  ?  sildenafil (REVATIO) 20 MG tablet, Take 2 -3 tablets by mouth once daily as needed, Disp: 30 tablet, Rfl: 1 ?  tamsulosin (FLOMAX) 0.4 MG CAPS capsule, Take 0.8 mg by mouth every evening., Disp: , Rfl:  ?  tamsulosin (FLOMAX) 0.4 MG CAPS capsule, Take 2 capsules by mouth everyday, Disp: 180 capsule, Rfl: 1 ?  Sodium Sulfate-Mag Sulfate-KCl (SUTAB) [phone] MG TABS, Take as directed for colonoscopy prep (Patient not taking: Reported on 11/17/2021), Disp: 24 tablet, Rfl: 0 ? ?EXAM: ? ?VITALS per patient if applicable: RR between 16-60 bpm ? ?GENERAL: alert, oriented, appears well and in no acute distress ? ?HEENT: atraumatic, conjunctiva clear, no obvious abnormalities on inspection of external nose and ears ? ?NECK: normal movements of the head and neck ? ?LUNGS: on inspection no signs of respiratory distress, breathing rate appears normal, no obvious gross SOB, gasping or wheezing ? ?CV: no obvious cyanosis ? ?MS: moves all visible extremities without noticeable abnormality ? ?PSYCH/NEURO:  pleasant and cooperative, no obvious depression or anxiety, speech and thought processing grossly intact ? ?ASSESSMENT AND PLAN: ? ?Discussed the following assessment and plan: ? ?Adverse effect of prednisone, initial e

## 2021-11-17 NOTE — Telephone Encounter (Signed)
Nurse Assessment ?Nurse: Nicki Reaper, RN, Malachy Mood Date/Time (Eastern Time): 11/17/2021 1:09:38 PM ?Confirm and document reason for call. If ?symptomatic, describe symptoms. ?---Caller states he was seen on 11/14/21, and dx with ?sinus infection, Rx given for Prednisone 6 day taper ?dose, tomorrow will take 2 pills and then the next 1 ?pill and it will be completed, he has buttocks and back ?pain after taking steroid, rates pain 3-4/10, has taken ?for pain, not sleeping, is restless, night sweats, cough, ?denies other symptoms, he was given referral for ENT ?and instructed to call back if he was unable to be seen ?in next 2 weeks, has appt scheduled for 12/15/21, ?Does the patient have any new or worsening ?symptoms? ---Yes ?Will a triage be completed? ---Yes ?Related visit to physician within the last 2 weeks? ---Yes ?Does the PT have any chronic conditions? (i.e. ?diabetes, asthma, this includes High risk factors for ?pregnancy, etc.) ?---No ?Is this a behavioral health or substance abuse call? ---No ?Guidelines ?Guideline Title Affirmed Question Affirmed Notes Nurse Date/Time (Eastern ?Time) ?Back Pain [1] MODERATE ?back pain (e.g., ?interferes with ?normal activities) ?Nicki Reaper, RN, Malachy Mood 11/17/2021 1:16:02 ?PM ?PLEASE NOTE: All timestamps contained within this report are represented as [place] Standard Time. ?CONFIDENTIALTY NOTICE: This fax transmission is intended only for the addressee. It contains information that is legally privileged, confidential or ?otherwise protected from use or disclosure. If you are not the intended recipient, you are strictly prohibited from reviewing, disclosing, copying using ?or disseminating any of this information or taking any action in reliance on or regarding this information. If you have received this fax in error, please ?notify us immediately by telephone so that we can arrange for its return to [place]. Phone: [phone], Toll-Free: [phone], Fax: [phone] ?Page: 2 of 2 ?Call Id:  [id number] ?Guidelines ?Guideline Title Affirmed Question Affirmed Notes Nurse Date/Time (Eastern ?Time) ?AND [2] present > 3 ?days ?Disp. Time (Eastern ?Time) Disposition Final User ?11/17/2021 1:22:28 PM See PCP within 24 Hours Yes Nicki Reaper, RN, Malachy Mood ?Disposition Overriden: SEE PCP WITHIN 3 DAYS ?Override Reason: Patient?s symptoms need a higher level of care ?Caller Disagree/Comply Comply ?Caller Understands Yes ?PreDisposition Call Doctor ?Care Advice Given Per Guideline ?SEE PCP WITHIN 24 HOURS: * IF OFFICE WILL BE OPEN: You need to be examined within the next 24 hours. Call your ?doctor (or NP/PA) when the office opens and make an appointment. PAIN MEDICINES: * For pain relief, you can take either ?acetaminophen, ibuprofen, or naproxen. PAIN MEDICINES - EXTRA NOTES AND WARNINGS: * Follow these dosing ?instructions unless your doctor (or NP/PA) has told you to take a different dose. CALL BACK IF: * You become worse ?Comments ?User: Burna Sis, RN Date/Time Eilene Ghazi Time): 11/17/2021 1:22:59 PM ?Warm transferred to [facility] in office for televisit today ?Referrals ?REFERRED TO PCP OFFICE ?

## 2021-11-20 ENCOUNTER — Telehealth: Payer: Self-pay

## 2021-11-20 ENCOUNTER — Telehealth: Payer: Self-pay | Admitting: Medical

## 2021-11-20 NOTE — Telephone Encounter (Signed)
Patient had video visit today with another provider and a CT has been ordered. ?

## 2021-11-20 NOTE — Telephone Encounter (Signed)
Called pt to inform him of the CT ordered of his sinuses while he is awaiting the ENT appt. Left VM per FYI, as pt did not answer. ?

## 2021-11-20 NOTE — Telephone Encounter (Signed)
Opened to review 

## 2021-11-22 ENCOUNTER — Ambulatory Visit (HOSPITAL_BASED_OUTPATIENT_CLINIC_OR_DEPARTMENT_OTHER)
Admission: RE | Admit: 2021-11-22 | Discharge: 2021-11-22 | Disposition: A | Discharge status: Home / Self Care | Payer: Medicare HMO | Source: Ambulatory Visit | Attending: Family Medicine | Admitting: Family Medicine

## 2021-11-22 DIAGNOSIS — J329 Chronic sinusitis, unspecified: Secondary | ICD-10-CM | POA: Insufficient documentation

## 2021-11-28 ENCOUNTER — Other Ambulatory Visit (HOSPITAL_BASED_OUTPATIENT_CLINIC_OR_DEPARTMENT_OTHER): Payer: Self-pay

## 2021-11-28 ENCOUNTER — Other Ambulatory Visit: Payer: Self-pay | Admitting: Cardiology

## 2021-11-28 MED ORDER — METOPROLOL SUCCINATE ER 25 MG PO TB24
12.5000 mg | ORAL_TABLET | Freq: Every day | ORAL | 0 refills | Status: DC
  Filled 2021-11-28: qty 45, 90d supply, fill #0

## 2021-12-05 ENCOUNTER — Ambulatory Visit (INDEPENDENT_AMBULATORY_CARE_PROVIDER_SITE_OTHER): Payer: Medicare HMO | Admitting: Orthopaedic Surgery

## 2021-12-05 ENCOUNTER — Encounter: Payer: Self-pay | Admitting: Orthopaedic Surgery

## 2021-12-05 ENCOUNTER — Telehealth: Payer: Self-pay | Admitting: *Deleted

## 2021-12-05 VITALS — Ht 70.0 in | Wt 199.0 lb

## 2021-12-05 DIAGNOSIS — S83281A Other tear of lateral meniscus, current injury, right knee, initial encounter: Secondary | ICD-10-CM | POA: Diagnosis not present

## 2021-12-05 NOTE — Progress Notes (Signed)
? ?Office Visit Note ?  ?Patient: Erik Salazar           ?Date of Birth: Jun 26, 1951           ?MRN: [id number] ?Visit Date: 12/05/2021 ?             ?Requested by: Marrian Salvage, [address] ?[place],  [place] [address] ?PCP: Marrian Salvage, [place] ? ? ?Assessment & Plan: ?Visit Diagnoses:  ?1. Acute lateral meniscus tear of right knee, initial encounter   ? ? ?Plan: [name] comes in today for chronic right knee pain.  We saw him in July of last year and at that time plans were for partial lateral meniscectomy but he chose to undergo back surgery and to postpone the knee surgery.  At this time he comes back to our office wishing to reschedule the knee surgery as he is having significant locking symptoms constantly. ? ?Examination of right knee is unchanged. ? ?Impression is acute right lateral meniscus tear.  Again we talked about options of arthroscopy versus total knee replacement and their associated risk benefits rehab and recovery and he has elected to try knee arthroscopy first.  We will get clearance from Dr. Marlou Porch first prior to scheduling.  Questions encouraged and answered. ? ?Follow-Up Instructions: No follow-ups on file.  ? ?Orders:  ?No orders of the defined types were placed in this encounter. ? ?No orders of the defined types were placed in this encounter. ? ? ? ? Procedures: ?No procedures performed ? ? ?Clinical Data: ?No additional findings. ? ? ?Subjective: ?Chief Complaint  ?Patient presents with  ? Right Knee - Pain  ? ? ?HPI ? ?Review of Systems ? ? ?Objective: ?Vital Signs: Ht '5\' 10"'$  (1.778 m)   Wt 199 lb (90.3 kg)   BMI 28.55 kg/m?  ? ?Physical Exam ? ?Ortho Exam ? ?Specialty Comments:  ?No specialty comments available. ? ?Imaging: ?No results found. ? ? ?PMFS History: ?Patient Active Problem List  ? Diagnosis Date Noted  ? Aortic atherosclerosis ([place]) 09/04/2021  ? Spondylolisthesis at L4-L5 level 06/28/2021  ? S/P repair of ventral hernia 05/19/2021  ?  S/P repair of recurrent ventral hernia 05/19/2021  ? Benign prostatic hyperplasia without lower urinary tract symptoms 10/14/2020  ? Chronic pain 10/14/2020  ? 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 Esophageal dysphagia 10/14/2020  ? Gastroesophageal reflux disease 10/14/2020  ? History of aortic valve replacement with bioprosthetic valve 10/14/2020  ? Hx of aortic aneurysm repair 10/14/2020  ? 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 Thoracic aortic aneurysm without rupture ([place]) 10/14/2020  ? Recurrent incisional hernia 10/14/2020  ? Spinal stenosis in cervical region 05/04/2020  ? 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  ? Cervical radiculopathy at C6 11/14/2017  ? Carpal tunnel syndrome on right 07/08/2017  ? Tremor, essential 07/08/2017  ? Ischemic stroke of frontal lobe ([place]) 04/05/2017  ? Pain in right hand 04/05/2017  ? 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 03/27/2017  ? Seizure ([place]) 03/14/2017  ? 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/13/2017  ? 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 Essential hypertension 02/26/2017  ? Mixed hyperlipidemia 02/26/2017  ? ?Past Medical History:  ?Diagnosis Date  ? Arthritis   ? Carpal tunnel syndrome on right 07/08/2017  ? Cervical radiculopathy at C6 11/14/2017  ? Right  ? Complication of anesthesia   ? Coronary artery disease   ? Enlarged prostate   ? H/O: knee surgery   ?  15   ? History of blood clots   ? History of open heart surgery   ? ASCENDING AORTIC ANEURYSM  ? Ischemic stroke of frontal lobe ( [place]) 03/14/2017  ? Bilateral; post-redo CT surgery  ? Pneumonia   ? PONV (postoperative nausea and vomiting)   ? only after CABG surgeries  ? Seizure disorder ([place]) 03/14/2017  ? Seizures ([place])   ? Status post knee surgery   ? DVT POST KNEE SURGERY  ? Stroke [facility])   ?  ?Family History  ?Problem Relation Age of Onset  ? Arthritis Mother   ? Aneurysm Mother   ?     brain aneurysm for mother.   ? Arthritis Father   ? Colon cancer Neg Hx   ? Esophageal  cancer Neg Hx   ?  ?Past Surgical History:  ?Procedure Laterality Date  ? ANTERIOR CERVICAL DECOMP/DISCECTOMY FUSION N/A 05/04/2020  ? Procedure: Anterior Cervical Decompression/Discectomy Fuion Cervical three-four, Cervical four-five, Cervical five-six;  Surgeon: Kary Kos, MD;  Location: [place];  Service: Neurosurgery;  Laterality: N/A;  ? BALLOON DILATION N/A 06/23/2019  ? Procedure: BALLOON DILATION;  Surgeon: Otis Brace, MD;  [facility];  Service: Gastroenterology;  Laterality: N/A;  ? BENTALL PROCEDURE  01/04/2016  ? Bentall with 23 mm pericardial AVR; SVG-LAD, SVG-CX ([place])  ? BICEPS TENDON REPAIR Right   ? BIOPSY  06/23/2019  ? Procedure: BIOPSY;  Surgeon: Otis Brace, MD;  [facility];  Service: Gastroenterology;;  ? CARDIAC SURGERY    ? ANUERSYM MAY 2017   Bentall procedure. Bioprosthetic aortic valve #23 mm bovine model #2700 TF ask, and 28 mm Gelweave woven vascular sinus of Valsalva graft  ? CARPAL TUNNEL RELEASE  08/2018  ? right hand   ? CORONARY ARTERY BYPASS GRAFT  01/04/2016  ? VG to LAD & VG to LCX  ? CORONARY ARTERY BYPASS GRAFT  03/13/2017  ? LIMA to LAD with steril abcess removal from dacron graft  ? ESOPHAGOGASTRODUODENOSCOPY    ? Had done dilatation done about 2 or 3 times before in [place]  ? ESOPHAGOGASTRODUODENOSCOPY (EGD) WITH PROPOFOL N/A 06/23/2019  ? Procedure: ESOPHAGOGASTRODUODENOSCOPY (EGD) WITH PROPOFOL;  Surgeon: Otis Brace, MD;  [facility];  Service: Gastroenterology;  Laterality: N/A;  ? FALSE ANEURYSM REPAIR  03/13/2017  ? redo sternotomy, sterile abscess removal from Dacron graft, omental flap around aorta, CABG: LIMA-LAD ([name], Dr. Mart Piggs)  ? GREATER OMENTAL FLAP CLOSURE  2018  ? Of pericardium 2018  ? INSERTION OF MESH  10/14/2020  ? Procedure: INSERTION OF MESH;  Surgeon: Michael Boston, MD;  Location: [place];  Service: General;;  ? knee surgeries    ? 13 surgeries on knee done before knee  replacement   ? KNEE SURGERY  1983  ? LAPAROSCOPIC LYSIS OF ADHESIONS N/A 05/19/2021  ? Procedure: LYSIS OF ADHESIONS;  Surgeon: Michael Boston, MD;  Location: [place];  Service: General;  Laterality: N/A;  GEN & LOCAL  ? LYSIS OF ADHESION N/A 10/14/2020  ? Procedure: LYSIS OF ADHESION;  Surgeon: Michael Boston, MD;  Location: [place];  Service: General;  Laterality: N/A;  ? open heart surgery    ? 03-13-2017  ? REPLACEMENT TOTAL KNEE  2015  ? ROTATOR CUFF REPAIR    ? TONSILLECTOMY    ? removed as a child.  ? VENTRAL HERNIA REPAIR N/A 10/14/2020  ? Procedure: LAPAROSCOPIC VENTRAL HERNIA REPAIR WITH TAP BLOCK BILATERAL;  Surgeon: Michael Boston, MD;  [facility];  Service: General;  Laterality: N/A;  ? VENTRAL HERNIA REPAIR N/A 05/19/2021  ? Procedure: LAPAROSCOPIC VENTRAL WALL HERNIA REPAIR;  Surgeon: Michael Boston, MD;  Location: [place];  Service: General;  Laterality: N/A;  ? ?Social History  ? ?Occupational History  ? Occupation: RETIRED  ?Tobacco Use  ? Smoking status: Former  ?  Types: Cigars  ? Smokeless tobacco: Never  ?Vaping Use  ? Vaping Use: Never used  ?Substance and Sexual Activity  ? Alcohol use: Yes  ?  Alcohol/week: 1.0 standard drink  ?  Types: 1 Cans of beer per week  ?  Comment: occassionally  ? Drug use: No  ? Sexual activity: Not on file  ? ? ? ? ? ? ?

## 2021-12-05 NOTE — Telephone Encounter (Signed)
? ?  Pre-operative Risk Assessment  ?  ?Patient Name: Erik Salazar  ?DOB: 04/17/1951 ?MRN: [id number]  ? ?  ? ?Request for Surgical Clearance   ? ?Procedure:   RIGHT KNEE PARTIAL LATERAL MENISCECTOMY  ? ?Date of Surgery:  Clearance TBD                              ?   ?Surgeon:  DR. Marianna Payment ?Surgeon's Group or Practice Name:  [facility] ?Phone number:  [phone] ?Fax number:  [phone] ATTN: DEBBIE ?  ?Type of Clearance Requested:   ?- Medical  ?- Pharmacy:  Hold Aspirin   ?  ?Type of Anesthesia:  General  ?  ?Additional requests/questions:   ? ?Signed, ?Julaine Hua   ?12/05/2021, 5:26 PM  ? ?

## 2021-12-06 NOTE — Telephone Encounter (Signed)
Pt has appt 01/04/22 with Dr. Marlou Porch, will add need pre op clearance to appt notes  ?

## 2021-12-06 NOTE — Telephone Encounter (Signed)
? ?  Name: Erik Salazar  ?DOB: 05-17-1951  ?MRN: [id number] ? ?Primary Cardiologist: Candee Furbish, MD ? ?Chart reviewed as part of pre-operative protocol coverage. Because of Erik Salazar's past medical history and time since last visit, he will require a follow-up in-office visit in order to better assess preoperative cardiovascular risk. ? ?Previously cleared to hold ASA 5 days. If no interval change in cardiac history, may be reasonable to proceed with this prior recommendation. ? ?Pre-op covering staff: ?- Please schedule appointment and call patient to inform them. If patient already had an upcoming appointment within acceptable timeframe, please add "pre-op clearance" to the appointment notes so provider is aware. ?- Please contact requesting surgeon's office via preferred method (i.e, phone, fax) to inform them of need for appointment prior to surgery. ? ?  ? ?Ledora Bottcher, PA  ?12/06/2021, 11:33 AM  ? ?

## 2021-12-12 ENCOUNTER — Telehealth: Payer: Self-pay | Admitting: Orthopaedic Surgery

## 2021-12-12 NOTE — Telephone Encounter (Signed)
See message. Do you want to see him this PM? ? ?

## 2021-12-12 NOTE — Telephone Encounter (Signed)
Tomorrow instead.  Tell him to take it easy today.  He's a chronic pain patient.  Thanks.

## 2021-12-12 NOTE — Telephone Encounter (Signed)
Appt made for tomorrow.

## 2021-12-12 NOTE — Telephone Encounter (Signed)
Left knee replacement --got out of bed and popped , swollen on the side. Wants to know if you can take a xray make sure everything is ok  ?

## 2021-12-13 ENCOUNTER — Encounter: Payer: Self-pay | Admitting: Orthopaedic Surgery

## 2021-12-13 ENCOUNTER — Ambulatory Visit (INDEPENDENT_AMBULATORY_CARE_PROVIDER_SITE_OTHER): Payer: Medicare HMO

## 2021-12-13 ENCOUNTER — Ambulatory Visit (INDEPENDENT_AMBULATORY_CARE_PROVIDER_SITE_OTHER): Payer: Medicare HMO | Admitting: Orthopaedic Surgery

## 2021-12-13 DIAGNOSIS — M25562 Pain in left knee: Secondary | ICD-10-CM

## 2021-12-13 DIAGNOSIS — G8929 Other chronic pain: Secondary | ICD-10-CM | POA: Diagnosis not present

## 2021-12-13 NOTE — Progress Notes (Signed)
? ?Office Visit Note ?  ?Patient: Erik Salazar           ?Date of Birth: 1951-03-29           ?MRN: [id number] ?Visit Date: 12/13/2021 ?             ?Requested by: Marrian Salvage, [address] ?[address] ?[place],  [place] [address] ?PCP: Marrian Salvage, [place] ? ? ?Assessment & Plan: ?Visit Diagnoses:  ?1. Chronic pain of left knee   ? ? ?Plan: X-rays showed no acute abnormalities or any interval changes compared to prior x-rays.  Impression is that he tore some scar tissue inside the knee which should resolve with some symptomatic treatment.  Increase activity as tolerated.  We will see him in the near future for right total knee replacement. ? ?Follow-Up Instructions: No follow-ups on file.  ? ?Orders:  ?Orders Placed This Encounter  ?Procedures  ? XR KNEE 3 VIEW LEFT  ? ?No orders of the defined types were placed in this encounter. ? ? ? ? Procedures: ?No procedures performed ? ? ?Clinical Data: ?No additional findings. ? ? ?Subjective: ?Chief Complaint  ?Patient presents with  ? Left Knee - Pain  ? ? ?HPI ? ?Koah comes in today for acute left knee pain.  He was getting out of bed a few days ago and felt his knee pop.  He developed some swelling afterwards.  The swelling and pain have improved since then. ? ?Review of Systems ? ? ?Objective: ?Vital Signs: There were no vitals taken for this visit. ? ?Physical Exam ? ?Ortho Exam ? ?Examination left knee shows a fully healed surgical scar.  Stable collaterals.  Small effusion.  Functional range of motion.  He is able to weight-bear and ambulate. ? ?Specialty Comments:  ?No specialty comments available. ? ?Imaging: ?XR KNEE 3 VIEW LEFT ? ?Result Date: 12/13/2021 ?Stable total knee replacement without complication.  No acute abnormalities.  No changes compared to prior x-rays.  ? ? ?PMFS History: ?Patient Active Problem List  ? Diagnosis Date Noted  ? Aortic atherosclerosis ([place]) 09/04/2021  ? Spondylolisthesis at L4-L5 level 06/28/2021  ? S/P  repair of ventral hernia 05/19/2021  ? S/P repair of recurrent ventral hernia 05/19/2021  ? Benign prostatic hyperplasia without lower urinary tract symptoms 10/14/2020  ? Chronic pain 10/14/2020  ? 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 Esophageal dysphagia 10/14/2020  ? Gastroesophageal reflux disease 10/14/2020  ? History of aortic valve replacement with bioprosthetic valve 10/14/2020  ? Hx of aortic aneurysm repair 10/14/2020  ? 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 Thoracic aortic aneurysm without rupture ([place]) 10/14/2020  ? Recurrent incisional hernia 10/14/2020  ? Spinal stenosis in cervical region 05/04/2020  ? 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  ? Cervical radiculopathy at C6 11/14/2017  ? Carpal tunnel syndrome on right 07/08/2017  ? Tremor, essential 07/08/2017  ? Ischemic stroke of frontal lobe ([place]) 04/05/2017  ? Pain in right hand 04/05/2017  ? 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 03/27/2017  ? Seizure ([place]) 03/14/2017  ? 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/13/2017  ? 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 Essential hypertension 02/26/2017  ? Mixed hyperlipidemia 02/26/2017  ? ?Past Medical History:  ?Diagnosis Date  ? Arthritis   ? Carpal tunnel syndrome on right 07/08/2017  ? Cervical radiculopathy at C6 11/14/2017  ? Right  ? Complication of anesthesia   ? Coronary artery disease   ? Enlarged  prostate   ? H/O: knee surgery   ? 15   ? History of blood clots   ? History of open heart surgery   ? ASCENDING AORTIC ANEURYSM  ? Ischemic stroke of frontal lobe ([place]) 03/14/2017  ? Bilateral; post-redo CT surgery  ? Pneumonia   ? PONV (postoperative nausea and vomiting)   ? only after CABG surgeries  ? Seizure disorder ([place]) 03/14/2017  ? Seizures ([place])   ? Status post knee surgery   ? DVT POST KNEE SURGERY  ? Stroke [facility])   ?  ?Family History  ?Problem Relation Age of Onset  ? Arthritis Mother   ? Aneurysm Mother   ?     brain aneurysm for mother.   ? Arthritis Father   ?  Colon cancer Neg Hx   ? Esophageal cancer Neg Hx   ?  ?Past Surgical History:  ?Procedure Laterality Date  ? ANTERIOR CERVICAL DECOMP/DISCECTOMY FUSION N/A 05/04/2020  ? Procedure: Anterior Cervical Decompression/Discectomy Fuion Cervical three-four, Cervical four-five, Cervical five-six;  Surgeon: Kary Kos, MD;  Location: [place];  Service: Neurosurgery;  Laterality: N/A;  ? BALLOON DILATION N/A 06/23/2019  ? Procedure: BALLOON DILATION;  Surgeon: Otis Brace, MD;  [facility];  Service: Gastroenterology;  Laterality: N/A;  ? BENTALL PROCEDURE  01/04/2016  ? Bentall with 23 mm pericardial AVR; SVG-LAD, SVG-CX ([place])  ? BICEPS TENDON REPAIR Right   ? BIOPSY  06/23/2019  ? Procedure: BIOPSY;  Surgeon: Otis Brace, MD;  [facility];  Service: Gastroenterology;;  ? CARDIAC SURGERY    ? ANUERSYM MAY 2017   Bentall procedure. Bioprosthetic aortic valve #23 mm bovine model #2700 TF ask, and 28 mm Gelweave woven vascular sinus of Valsalva graft  ? CARPAL TUNNEL RELEASE  08/2018  ? right hand   ? CORONARY ARTERY BYPASS GRAFT  01/04/2016  ? VG to LAD & VG to LCX  ? CORONARY ARTERY BYPASS GRAFT  03/13/2017  ? LIMA to LAD with steril abcess removal from dacron graft  ? ESOPHAGOGASTRODUODENOSCOPY    ? Had done dilatation done about 2 or 3 times before in [place]  ? ESOPHAGOGASTRODUODENOSCOPY (EGD) WITH PROPOFOL N/A 06/23/2019  ? Procedure: ESOPHAGOGASTRODUODENOSCOPY (EGD) WITH PROPOFOL;  Surgeon: Otis Brace, MD;  [facility];  Service: Gastroenterology;  Laterality: N/A;  ? FALSE ANEURYSM REPAIR  03/13/2017  ? redo sternotomy, sterile abscess removal from Dacron graft, omental flap around aorta, CABG: LIMA-LAD ([name], Dr. Mart Piggs)  ? GREATER OMENTAL FLAP CLOSURE  2018  ? Of pericardium 2018  ? INSERTION OF MESH  10/14/2020  ? Procedure: INSERTION OF MESH;  Surgeon: Michael Boston, MD;  Location: [place];  Service: General;;  ? knee surgeries    ? 13  surgeries on knee done before knee replacement   ? KNEE SURGERY  1983  ? LAPAROSCOPIC LYSIS OF ADHESIONS N/A 05/19/2021  ? Procedure: LYSIS OF ADHESIONS;  Surgeon: Michael Boston, MD;  Location: [place];  Service: General;  Laterality: N/A;  GEN & LOCAL  ? LYSIS OF ADHESION N/A 10/14/2020  ? Procedure: LYSIS OF ADHESION;  Surgeon: Michael Boston, MD;  Location: [place];  Service: General;  Laterality: N/A;  ? open heart surgery    ? 03-13-2017  ? REPLACEMENT TOTAL KNEE  2015  ? ROTATOR CUFF REPAIR    ? TONSILLECTOMY    ? removed as a child.  ? VENTRAL HERNIA REPAIR N/A 10/14/2020  ? Procedure: LAPAROSCOPIC VENTRAL HERNIA REPAIR WITH TAP  BLOCK BILATERAL;  Surgeon: Michael Boston, MD;  Location: [place];  Service: General;  Laterality: N/A;  ? VENTRAL HERNIA REPAIR N/A 05/19/2021  ? Procedure: LAPAROSCOPIC VENTRAL WALL HERNIA REPAIR;  Surgeon: Michael Boston, MD;  Location: [place];  Service: General;  Laterality: N/A;  ? ?Social History  ? ?Occupational History  ? Occupation: RETIRED  ?Tobacco Use  ? Smoking status: Former  ?  Types: Cigars  ? Smokeless tobacco: Never  ?Vaping Use  ? Vaping Use: Never used  ?Substance and Sexual Activity  ? Alcohol use: Yes  ?  Alcohol/week: 1.0 standard drink  ?  Types: 1 Cans of beer per week  ?  Comment: occassionally  ? Drug use: No  ? Sexual activity: Not on file  ? ? ? ? ? ? ?

## 2021-12-15 ENCOUNTER — Other Ambulatory Visit (HOSPITAL_BASED_OUTPATIENT_CLINIC_OR_DEPARTMENT_OTHER): Payer: Self-pay

## 2021-12-15 MED ORDER — TRIAMCINOLONE ACETONIDE 55 MCG/ACT NA AERO
INHALATION_SPRAY | NASAL | 3 refills | Status: DC
  Filled 2021-12-15: qty 16.9, 30d supply, fill #0

## 2021-12-15 NOTE — Telephone Encounter (Signed)
Appointment scheduled.

## 2021-12-18 ENCOUNTER — Ambulatory Visit: Payer: Medicare HMO | Attending: Internal Medicine

## 2021-12-18 DIAGNOSIS — Z23 Encounter for immunization: Secondary | ICD-10-CM

## 2021-12-18 NOTE — Progress Notes (Signed)
? ?  Covid-19 Vaccination Clinic ? ?Name:  Arvis Zwahlen    ?MRN: [id number] ?DOB: 24-Mar-1951 ? ?12/18/2021 ? ?Mr. Eifler was observed post Covid-19 immunization for 15 minutes without incident. He was provided with Vaccine Information Sheet and instruction to access the V-Safe system.  ? ?Mr. Bensen was instructed to call 911 with any severe reactions post vaccine: ?Difficulty breathing  ?Swelling of face and throat  ?A fast heartbeat  ?A bad rash all over body  ?Dizziness and weakness  ? ?Immunizations Administered   ? ? Name Date Dose VIS Date Route  ? Ambulance person Booster 12/18/2021  9:17 AM 0.3 mL 04/19/2021 Intramuscular  ? Manufacturer: [place]: [id number]  ? [place]: [phone]  ? ?  ?  ?

## 2021-12-20 NOTE — Progress Notes (Signed)
? ?Subjective:  ? Erik Salazar is a 71 y.o. male who presents for an Initial Medicare Annual Wellness Visit. ? ?I connected with  Melbourne Leyva on 12/21/21 by a audio enabled telemedicine application and verified that I am speaking with the correct person using two identifiers. ? ?Patient Location: Home ? ?Provider Location: Office/Clinic ? ?I discussed the limitations of evaluation and management by telemedicine. The patient expressed understanding and agreed to proceed.  ? ?Review of Systems    ? ?Cardiac Risk Factors include: advanced age (>60mn, >>36women);hypertension;dyslipidemia ? ?   ?Objective:  ?  ?Today's Vitals  ? 12/21/21 0956  ?PainSc: 5   ? ?There is no height or weight on file to calculate BMI. ? ? ?  12/21/2021  ?  9:55 AM 08/24/2021  ?  8:10 AM 06/28/2021  ?  7:16 AM 06/26/2021  ?  8:21 AM 05/16/2021  ?  8:12 AM 10/06/2020  ?  8:16 AM 05/02/2020  ?  8:54 AM  ?Advanced Directives  ?Does Patient Have a Medical Advance Directive? Yes Yes Yes Yes Yes Yes Yes  ?Type of AParamedicof [place] Side[place] of facility DNR (pink MOST or yellow form);Living will HPresque Isle HarborLiving will HEvartsLiving will HWhitesvilleLiving will HMaribelLiving will HPine BluffLiving will HCarlsbadLiving will  ?Does patient want to make changes to medical advance directive?  No - Patient declined  No - Patient declined  No - Patient declined   ?Copy of [place] Chart? No - copy requested No - copy requested Yes - validated most recent copy scanned in chart (See row information) Yes - validated most recent copy scanned in chart (See row information)  Yes - validated most recent copy scanned in chart (See row information)   ? ? ?Current Medications (verified) ?Outpatient Encounter Medications as of 12/21/2021  ?Medication Sig  ? ascorbic acid (VITAMIN C) 500 MG tablet Take 500 mg by  mouth daily.  ? aspirin EC 81 MG tablet Take 81 mg by mouth daily. Swallow whole.  ? Cholecalciferol (VITAMIN D3) 50 MCG (2000 UT) TABS Take 2,000 Units by mouth daily.  ? levETIRAcetam (KEPPRA) 1000 MG tablet Take 1 tablet (1,000 mg total) by mouth 2 (two) times daily.  ? levocetirizine (XYZAL) 5 MG tablet Take 1 tablet (5 mg total) by mouth every evening.  ? levofloxacin (LEVAQUIN) 500 MG tablet Take 1 tablet (500 mg total) by mouth daily for 7 days.  ? metoprolol succinate (TOPROL-XL) 25 MG 24 hr tablet Take 1/2 tablet (12.5 mg total) by mouth daily. With or immediately following a meal.  ? pravastatin (PRAVACHOL) 40 MG tablet Take 1 tablet (40 mg total) by mouth every evening.  ? predniSONE (DELTASONE) 10 MG tablet Take 6 tablets by mouth on day 1, then 5 tablets on day 2, then 4 tablets on day 3, then 3 tablets on day 4, then 2 tablets on day 5, then 1 tablet on day 6.  ? pyridOXINE (VITAMIN B-6) 100 MG tablet Take 100 mg by mouth daily.  ? sildenafil (REVATIO) 20 MG tablet Take 2 -3 tablets by mouth once daily as needed  ? Sodium Sulfate-Mag Sulfate-KCl (SUTAB) 1308-248-1779MG TABS Take as directed for colonoscopy prep  ? tamsulosin (FLOMAX) 0.4 MG CAPS capsule Take 0.8 mg by mouth every evening.  ? tamsulosin (FLOMAX) 0.4 MG CAPS capsule Take 2 capsules by mouth everyday  ? triamcinolone (  NASACORT) 55 MCG/ACT AERO nasal inhaler Place 1 spray into affected nostril 2 times daily.  ? ?No facility-administered encounter medications on file as of 12/21/2021.  ? ? ?Allergies (verified) ?Oxycodone hcl, Zetia [ezetimibe], Augmentin [amoxicillin-pot clavulanate], Chlorhexidine, and Elemental sulfur  ? ?History: ?Past Medical History:  ?Diagnosis Date  ? Arthritis   ? Carpal tunnel syndrome on right 07/08/2017  ? Cervical radiculopathy at C6 11/14/2017  ? Right  ? Complication of anesthesia   ? Coronary artery disease   ? Enlarged prostate   ? H/O: knee surgery   ? 15   ? History of blood clots   ? History of open heart  surgery   ? ASCENDING AORTIC ANEURYSM  ? Ischemic stroke of frontal lobe ([place]) 03/14/2017  ? Bilateral; post-redo CT surgery  ? Pneumonia   ? PONV (postoperative nausea and vomiting)   ? only after CABG surgeries  ? Seizure disorder ([place]) 03/14/2017  ? Seizures ([place])   ? Status post knee surgery   ? DVT POST KNEE SURGERY  ? Stroke [facility])   ? ?Past Surgical History:  ?Procedure Laterality Date  ? ANTERIOR CERVICAL DECOMP/DISCECTOMY FUSION N/A 05/04/2020  ? Procedure: Anterior Cervical Decompression/Discectomy Fuion Cervical three-four, Cervical four-five, Cervical five-six;  Surgeon: Kary Kos, MD;  Location: [place];  Service: Neurosurgery;  Laterality: N/A;  ? BALLOON DILATION N/A 06/23/2019  ? Procedure: BALLOON DILATION;  Surgeon: Otis Brace, MD;  [facility];  Service: Gastroenterology;  Laterality: N/A;  ? BENTALL PROCEDURE  01/04/2016  ? Bentall with 23 mm pericardial AVR; SVG-LAD, SVG-CX ([place])  ? BICEPS TENDON REPAIR Right   ? BIOPSY  06/23/2019  ? Procedure: BIOPSY;  Surgeon: Otis Brace, MD;  Location: [facility];  Service: Gastroenterology;;  ? CARDIAC SURGERY    ? ANUERSYM MAY 2017   Bentall procedure. Bioprosthetic aortic valve #23 mm bovine model #2700 TF ask, and 28 mm Gelweave woven vascular sinus of Valsalva graft  ? CARPAL TUNNEL RELEASE  08/2018  ? right hand   ? CORONARY ARTERY BYPASS GRAFT  01/04/2016  ? VG to LAD & VG to LCX  ? CORONARY ARTERY BYPASS GRAFT  03/13/2017  ? LIMA to LAD with steril abcess removal from dacron graft  ? ESOPHAGOGASTRODUODENOSCOPY    ? Had done dilatation done about 2 or 3 times before in [place]  ? ESOPHAGOGASTRODUODENOSCOPY (EGD) WITH PROPOFOL N/A 06/23/2019  ? Procedure: ESOPHAGOGASTRODUODENOSCOPY (EGD) WITH PROPOFOL;  Surgeon: Otis Brace, MD;  [facility];  Service: Gastroenterology;  Laterality: N/A;  ? FALSE ANEURYSM REPAIR  03/13/2017  ? redo sternotomy, sterile abscess removal from Dacron graft,  omental flap around aorta, CABG: LIMA-LAD ([name], Dr. Mart Piggs)  ? GREATER OMENTAL FLAP CLOSURE  2018  ? Of pericardium 2018  ? INSERTION OF MESH  10/14/2020  ? Procedure: INSERTION OF MESH;  Surgeon: Michael Boston, MD;  Location: [place];  Service: General;;  ? knee surgeries    ? 13 surgeries on knee done before knee replacement   ? KNEE SURGERY  1983  ? LAPAROSCOPIC LYSIS OF ADHESIONS N/A 05/19/2021  ? Procedure: LYSIS OF ADHESIONS;  Surgeon: Michael Boston, MD;  Location: [place];  Service: General;  Laterality: N/A;  GEN & LOCAL  ? LYSIS OF ADHESION N/A 10/14/2020  ? Procedure: LYSIS OF ADHESION;  Surgeon: Michael Boston, MD;  Location: [place];  Service: General;  Laterality: N/A;  ? open heart surgery    ? 03-13-2017  ? REPLACEMENT TOTAL KNEE  2015  ? ROTATOR CUFF REPAIR    ? TONSILLECTOMY    ? removed as a child.  ? VENTRAL HERNIA REPAIR N/A 10/14/2020  ? Procedure: LAPAROSCOPIC VENTRAL HERNIA REPAIR WITH TAP BLOCK BILATERAL;  Surgeon: Michael Boston, MD;  Location: [place];  Service: General;  Laterality: N/A;  ? VENTRAL HERNIA REPAIR N/A 05/19/2021  ? Procedure: LAPAROSCOPIC VENTRAL WALL HERNIA REPAIR;  Surgeon: Michael Boston, MD;  Location: [place];  Service: General;  Laterality: N/A;  ? ?Family History  ?Problem Relation Age of Onset  ? Arthritis Mother   ? Aneurysm Mother   ?     brain aneurysm for mother.   ? Arthritis Father   ? Colon cancer Neg Hx   ? Esophageal cancer Neg Hx   ? ?Social History  ? ?Socioeconomic History  ? Marital status: Married  ?  Spouse name: Not on file  ? Number of children: Not on file  ? Years of education: Not on file  ? Highest education level: Not on file  ?Occupational History  ? Occupation: RETIRED  ?Tobacco Use  ? Smoking status: Former  ?  Types: Cigars  ? Smokeless tobacco: Never  ?Vaping Use  ? Vaping Use: Never used  ?Substance and Sexual Activity  ? Alcohol use: Yes  ?  Alcohol/week: 1.0 standard drink  ?  Types: 1 Cans of beer per week  ?  Comment: occassionally  ? Drug  use: No  ? Sexual activity: Not on file  ?Other Topics Concern  ? Not on file  ?Social History Narrative  ? Lives at home with wife, is retired.  Education: 2 yrs college.   2 Children.  ? ?Social Determinan

## 2021-12-21 ENCOUNTER — Ambulatory Visit (INDEPENDENT_AMBULATORY_CARE_PROVIDER_SITE_OTHER): Payer: Medicare HMO

## 2021-12-21 DIAGNOSIS — Z0000 Encounter for general adult medical examination without abnormal findings: Secondary | ICD-10-CM

## 2021-12-21 NOTE — Patient Instructions (Signed)
Mr. Kleckner , ?Thank you for taking time to come for your Medicare Wellness Visit. I appreciate your ongoing commitment to your health goals. Please review the following plan we discussed and let me know if I can assist you in the future.  ? ?Screening recommendations/referrals: ?Colonoscopy: scheduling son ?Recommended yearly ophthalmology/optometry visit for glaucoma screening and checkup ?Recommended yearly dental visit for hygiene and checkup ? ?Vaccinations: ?Influenza vaccine: up to date per pt ?Pneumococcal vaccine: up to date per pt ?Tdap vaccine: up to date per pt ?Shingles vaccine: up to date per pt   ?Covid-19: completed ? ?Advanced directives: yes, not on file ? ?Conditions/risks identified: see problem list  ? ?Next appointment: Follow up in one year for your annual wellness visit.  ? ?Preventive Care 71 Years and Older, Male ?Preventive care refers to lifestyle choices and visits with your health care provider that can promote health and wellness. ?What does preventive care include? ?A yearly physical exam. This is also called an annual well check. ?Dental exams once or twice a year. ?Routine eye exams. Ask your health care provider how often you should have your eyes checked. ?Personal lifestyle choices, including: ?Daily care of your teeth and gums. ?Regular physical activity. ?Eating a healthy diet. ?Avoiding tobacco and drug use. ?Limiting alcohol use. ?Practicing safe sex. ?Taking low doses of aspirin every day. ?Taking vitamin and mineral supplements as recommended by your health care provider. ?What happens during an annual well check? ?The services and screenings done by your health care provider during your annual well check will depend on your age, overall health, lifestyle risk factors, and family history of disease. ?Counseling  ?Your health care provider may ask you questions about your: ?Alcohol use. ?Tobacco use. ?Drug use. ?Emotional well-being. ?Home and relationship  well-being. ?Sexual activity. ?Eating habits. ?History of falls. ?Memory and ability to understand (cognition). ?Work and work Statistician. ?Screening  ?You may have the following tests or measurements: ?Height, weight, and BMI. ?Blood pressure. ?Lipid and cholesterol levels. These may be checked every 5 years, or more frequently if you are over 71 years old. ?Skin check. ?Lung cancer screening. You may have this screening every year starting at age 71 if you have a 30-pack-year history of smoking and currently smoke or have quit within the past 15 years. ?Fecal occult blood test (FOBT) of the stool. You may have this test every year starting at age 71. ?Flexible sigmoidoscopy or colonoscopy. You may have a sigmoidoscopy every 5 years or a colonoscopy every 10 years starting at age 71. ?Prostate cancer screening. Recommendations will vary depending on your family history and other risks. ?Hepatitis C blood test. ?Hepatitis B blood test. ?Sexually transmitted disease (STD) testing. ?Diabetes screening. This is done by checking your blood sugar (glucose) after you have not eaten for a while (fasting). You may have this done every 1-3 years. ?Abdominal aortic aneurysm (AAA) screening. You may need this if you are a current or former smoker. ?Osteoporosis. You may be screened starting at age 71 if you are at high risk. ?Talk with your health care provider about your test results, treatment options, and if necessary, the need for more tests. ?Vaccines  ?Your health care provider may recommend certain vaccines, such as: ?Influenza vaccine. This is recommended every year. ?Tetanus, diphtheria, and acellular pertussis (Tdap, Td) vaccine. You may need a Td booster every 10 years. ?Zoster vaccine. You may need this after age 71. ?Pneumococcal 13-valent conjugate (PCV13) vaccine. One dose is recommended after age 71. ?  Pneumococcal polysaccharide (PPSV23) vaccine. One dose is recommended after age 71. ?Talk to your health care  provider about which screenings and vaccines you need and how often you need them. ?This information is not intended to replace advice given to you by your health care provider. Make sure you discuss any questions you have with your health care provider. ?Document Released: 09/02/2015 Document Revised: 04/25/2016 Document Reviewed: 06/07/2015 ?Elsevier Interactive Patient Education ? 2017 [place]. ? ?Fall Prevention in the Home ?Falls can cause injuries. They can happen to people of all ages. There are many things you can do to make your home safe and to help prevent falls. ?What can I do on the outside of my home? ?Regularly fix the edges of walkways and driveways and fix any cracks. ?Remove anything that might make you trip as you walk through a door, such as a raised step or threshold. ?Trim any bushes or trees on the path to your home. ?Use bright outdoor lighting. ?Clear any walking paths of anything that might make someone trip, such as rocks or tools. ?Regularly check to see if handrails are loose or broken. Make sure that both sides of any steps have handrails. ?Any raised decks and porches should have guardrails on the edges. ?Have any leaves, snow, or ice cleared regularly. ?Use sand or salt on walking paths during winter. ?Clean up any spills in your garage right away. This includes oil or grease spills. ?What can I do in the bathroom? ?Use night lights. ?Install grab bars by the toilet and in the tub and shower. Do not use towel bars as grab bars. ?Use non-skid mats or decals in the tub or shower. ?If you need to sit down in the shower, use a plastic, non-slip stool. ?Keep the floor dry. Clean up any water that spills on the floor as soon as it happens. ?Remove soap buildup in the tub or shower regularly. ?Attach bath mats securely with double-sided non-slip rug tape. ?Do not have throw rugs and other things on the floor that can make you trip. ?What can I do in the bedroom? ?Use night lights. ?Make  sure that you have a light by your bed that is easy to reach. ?Do not use any sheets or blankets that are too big for your bed. They should not hang down onto the floor. ?Have a firm chair that has side arms. You can use this for support while you get dressed. ?Do not have throw rugs and other things on the floor that can make you trip. ?What can I do in the kitchen? ?Clean up any spills right away. ?Avoid walking on wet floors. ?Keep items that you use a lot in easy-to-reach places. ?If you need to reach something above you, use a strong step stool that has a grab bar. ?Keep electrical cords out of the way. ?Do not use floor polish or wax that makes floors slippery. If you must use wax, use non-skid floor wax. ?Do not have throw rugs and other things on the floor that can make you trip. ?What can I do with my stairs? ?Do not leave any items on the stairs. ?Make sure that there are handrails on both sides of the stairs and use them. Fix handrails that are broken or loose. Make sure that handrails are as long as the stairways. ?Check any carpeting to make sure that it is firmly attached to the stairs. Fix any carpet that is loose or worn. ?Avoid having throw rugs at the  top or bottom of the stairs. If you do have throw rugs, attach them to the floor with carpet tape. ?Make sure that you have a light switch at the top of the stairs and the bottom of the stairs. If you do not have them, ask someone to add them for you. ?What else can I do to help prevent falls? ?Wear shoes that: ?Do not have high heels. ?Have rubber bottoms. ?Are comfortable and fit you well. ?Are closed at the toe. Do not wear sandals. ?If you use a stepladder: ?Make sure that it is fully opened. Do not climb a closed stepladder. ?Make sure that both sides of the stepladder are locked into place. ?Ask someone to hold it for you, if possible. ?Clearly mark and make sure that you can see: ?Any grab bars or handrails. ?First and last steps. ?Where the  edge of each step is. ?Use tools that help you move around (mobility aids) if they are needed. These include: ?Canes. ?Walkers. ?Scooters. ?Crutches. ?Turn on the lights when you go into a dark area. Replace any light b

## 2021-12-22 ENCOUNTER — Ambulatory Visit: Payer: Medicare HMO | Attending: Neurosurgery | Admitting: Physical Therapy

## 2021-12-22 ENCOUNTER — Encounter: Payer: Self-pay | Admitting: Physical Therapy

## 2021-12-22 DIAGNOSIS — M6281 Muscle weakness (generalized): Secondary | ICD-10-CM | POA: Diagnosis present

## 2021-12-22 DIAGNOSIS — M62838 Other muscle spasm: Secondary | ICD-10-CM | POA: Insufficient documentation

## 2021-12-22 DIAGNOSIS — R252 Cramp and spasm: Secondary | ICD-10-CM

## 2021-12-22 DIAGNOSIS — M5442 Lumbago with sciatica, left side: Secondary | ICD-10-CM

## 2021-12-22 NOTE — Patient Instructions (Signed)
Access Code: [email or web address] ?URL: [email or web address] ?Date: 12/22/2021 ?Prepared by: Glenetta Hew ? ?Patient Education ?- TENS Therapy ?- TENS Unit ?

## 2021-12-22 NOTE — Therapy (Signed)
[facility] ?[address] ?[place], [place], [address] ?Phone: [phone]   Fax:  [phone] ? ?Physical Therapy Evaluation ? ?Patient Details  ?Name: Erik Salazar ?MRN: [id number] ?Date of Birth: 1951/06/12 ?Referring Provider (PT): Kary Kos MD ? ? ?Encounter Date: 12/22/2021 ? ? PT End of Session - 12/22/21 0855   ? ? Visit Number 1   ? Number of Visits 12   ? Date for PT Re-Evaluation 02/02/22   ? Authorization Type Aetna Medicare   ? Progress Note Due on Visit 10   ? PT Start Time 0805   ? PT Stop Time 0845   ? PT Time Calculation (min) 40 min   ? Activity Tolerance Patient tolerated treatment well   ? Behavior During Therapy [facility] for tasks assessed/performed   ? ?  ?  ? ?  ? ? ?Past Medical History:  ?Diagnosis Date  ? Arthritis   ? Carpal tunnel syndrome on right 07/08/2017  ? Cervical radiculopathy at C6 11/14/2017  ? Right  ? Complication of anesthesia   ? Coronary artery disease   ? Enlarged prostate   ? H/O: knee surgery   ? 15   ? History of blood clots   ? History of open heart surgery   ? ASCENDING AORTIC ANEURYSM  ? Ischemic stroke of frontal lobe ([place]) 03/14/2017  ? Bilateral; post-redo CT surgery  ? Pneumonia   ? PONV (postoperative nausea and vomiting)   ? only after CABG surgeries  ? Seizure disorder ([place]) 03/14/2017  ? Seizures ([place])   ? Status post knee surgery   ? DVT POST KNEE SURGERY  ? Stroke [facility])   ? ? ?Past Surgical History:  ?Procedure Laterality Date  ? ANTERIOR CERVICAL DECOMP/DISCECTOMY FUSION N/A 05/04/2020  ? Procedure: Anterior Cervical Decompression/Discectomy Fuion Cervical three-four, Cervical four-five, Cervical five-six;  Surgeon: Kary Kos, MD;  Location: [place];  Service: Neurosurgery;  Laterality: N/A;  ? BALLOON DILATION N/A 06/23/2019  ? Procedure: BALLOON DILATION;  Surgeon: Otis Brace, MD;  [facility];  Service: Gastroenterology;  Laterality: N/A;  ? BENTALL PROCEDURE  01/04/2016  ?  Bentall with 23 mm pericardial AVR; SVG-LAD, SVG-CX ([place])  ? BICEPS TENDON REPAIR Right   ? BIOPSY  06/23/2019  ? Procedure: BIOPSY;  Surgeon: Otis Brace, MD;  [facility];  Service: Gastroenterology;;  ? CARDIAC SURGERY    ? ANUERSYM MAY 2017   Bentall procedure. Bioprosthetic aortic valve #23 mm bovine model #2700 TF ask, and 28 mm Gelweave woven vascular sinus of Valsalva graft  ? CARPAL TUNNEL RELEASE  08/2018  ? right hand   ? CORONARY ARTERY BYPASS GRAFT  01/04/2016  ? VG to LAD & VG to LCX  ? CORONARY ARTERY BYPASS GRAFT  03/13/2017  ? LIMA to LAD with steril abcess removal from dacron graft  ? ESOPHAGOGASTRODUODENOSCOPY    ? Had done dilatation done about 2 or 3 times before in [place]  ? ESOPHAGOGASTRODUODENOSCOPY (EGD) WITH PROPOFOL N/A 06/23/2019  ? Procedure: ESOPHAGOGASTRODUODENOSCOPY (EGD) WITH PROPOFOL;  Surgeon: Otis Brace, MD;  [facility];  Service: Gastroenterology;  Laterality: N/A;  ? FALSE ANEURYSM REPAIR  03/13/2017  ? redo sternotomy, sterile abscess removal from Dacron graft, omental flap around aorta, CABG: LIMA-LAD ([name], Dr. Mart Piggs)  ? GREATER OMENTAL FLAP CLOSURE  2018  ? Of pericardium 2018  ? INSERTION OF MESH  10/14/2020  ? Procedure: INSERTION OF MESH;  Surgeon:  Michael Boston, MD;  Location: [place];  Service: General;;  ? knee surgeries    ? 13 surgeries on knee done before knee replacement   ? KNEE SURGERY  1983  ? LAPAROSCOPIC LYSIS OF ADHESIONS N/A 05/19/2021  ? Procedure: LYSIS OF ADHESIONS;  Surgeon: Michael Boston, MD;  Location: [place];  Service: General;  Laterality: N/A;  GEN & LOCAL  ? LYSIS OF ADHESION N/A 10/14/2020  ? Procedure: LYSIS OF ADHESION;  Surgeon: Michael Boston, MD;  Location: [place];  Service: General;  Laterality: N/A;  ? open heart surgery    ? 03-13-2017  ? REPLACEMENT TOTAL KNEE  2015  ? ROTATOR CUFF REPAIR    ? TONSILLECTOMY    ? removed as a child.  ? VENTRAL HERNIA REPAIR N/A 10/14/2020  ?  Procedure: LAPAROSCOPIC VENTRAL HERNIA REPAIR WITH TAP BLOCK BILATERAL;  Surgeon: Michael Boston, MD;  Location: [place];  Service: General;  Laterality: N/A;  ? VENTRAL HERNIA REPAIR N/A 05/19/2021  ? Procedure: LAPAROSCOPIC VENTRAL WALL HERNIA REPAIR;  Surgeon: Michael Boston, MD;  Location: [place];  Service: General;  Laterality: N/A;  ? ? ?There were no vitals filed for this visit. ? ? ? Subjective Assessment - 12/22/21 0809   ? ? Subjective Patient reports he had to stop therapy before due to sick family member that has since passed away, and then also had illness "still not sure what it was."  They gave him a medicationthat makes his eyes blurry so can't drive.  He's having trouble with his knees as well, right knee needs to be replaced, L one was replaced but no has tear. "I got out of bed yesterday and heard a pop!"  His back is killing him.  He's having trouble sleeping again.   ? Pertinent History PMH: chronic pain,  stroke, HTN, dysphagia, gerd, tremor, seizure, cervical stenosis,  surgery - aortic aneurism repair, hernia repair, aortic valve repair, omental flap graft to mediastinum   ? Limitations Standing;Walking;Lifting;House hold activities   ? How long can you sit comfortably? 20 minutes, before needs to get up and walk   ? How long can you stand comfortably? 20 minutes   ? How long can you walk comfortably? can do a mile on the treadmill, limited due to knee pain and back pain also increases with walking   ? Diagnostic tests recent back x-rays demonstrate excellent alighment and corporation of the implants   ? Patient Stated Goals #1 is to improve pain   ? Currently in Pain? Yes   ? Pain Score 9    ? Pain Location Back   ? Pain Orientation Lower   ? Pain Descriptors / Indicators Aching;Dull   ? Pain Type Acute pain;Chronic pain   ? Pain Radiating Towards both buttocks and sides   ? Pain Onset More than a month ago   ? Pain Frequency Constant   ? Aggravating Factors  prolonged sitting, difficulty  sleeping   ? Pain Relieving Factors tylenol to take the edge off   ? Effect of Pain on Daily Activities interfering with sleep   ? ?  ?  ? ?  ? ? ? ? ? [facility] PT Assessment - 12/22/21 0001   ? ?  ? Assessment  ? Medical Diagnosis M43.16 (ICD-10-CM) - Spondylolisthesis, lumbar region   ? Referring Provider (PT) Kary Kos MD   ? Onset Date/Surgical Date 06/28/21   ? Hand Dominance Right   ? Next MD Visit in 3 months   ?  Prior Therapy yes   ?  ? Precautions  ? Precautions Fall   ? Precaution Comments L knee buckling reported.   ?  ? Restrictions  ? Weight Bearing Restrictions No   ?  ? Balance Screen  ? Has the patient fallen in the past 6 months No   ? Has the patient had a decrease in activity level because of a fear of falling?  No   ? Is the patient reluctant to leave their home because of a fear of falling?  No   ?  ? Home Environment  ? Living Environment Private residence   ? Living Arrangements Spouse/significant other   ? Type of Home Other(Comment)   ? Home Access Level entry   ? Home Layout One level   ?  ? Prior Function  ? Level of Independence Independent   ? Vocation Retired   ? Leisure go to gym, go out with friends   ?  ? Cognition  ? Overall Cognitive Status Within Functional Limits for tasks assessed   ?  ? Observation/Other Assessments  ? Observations enters slowly, obvious discomfort, wearing LB brace and compression sleeve on L knee.   ? Focus on Therapeutic Outcomes (FOTO)  58%( 49% adjusted), predicted outcome 60% after 12 visits.   ?  ? Sensation  ? Light Touch Appears Intact   ?  ? Posture/Postural Control  ? Posture/Postural Control Postural limitations   ? Postural Limitations Decreased lumbar lordosis   ?  ? ROM / Strength  ? AROM / PROM / Strength AROM;PROM;Strength   ?  ? AROM  ? Overall AROM  Deficits;Due to pain   ? AROM Assessment Site Lumbar;Knee   ? Right/Left Knee Left   ? Left Knee Extension -25   ? Lumbar Flexion limited 50%, increased pain, noted knee flexion with flexion   ?  Lumbar Extension limited 100%   ? Lumbar - Right Side Bend limited 75%   ? Lumbar - Left Side Bend limited 50%   ? Lumbar - Right Rotation slighlty more than L rotation, but increased LBP on L side   ? Lumbar - Left Rotat

## 2021-12-25 ENCOUNTER — Other Ambulatory Visit (HOSPITAL_BASED_OUTPATIENT_CLINIC_OR_DEPARTMENT_OTHER): Payer: Self-pay

## 2021-12-25 ENCOUNTER — Ambulatory Visit (INDEPENDENT_AMBULATORY_CARE_PROVIDER_SITE_OTHER): Payer: Medicare HMO | Admitting: Family

## 2021-12-25 ENCOUNTER — Telehealth: Payer: Self-pay | Admitting: Family

## 2021-12-25 VITALS — BP 126/50 | HR 79 | Temp 98.3°F | Resp 16 | Wt 206.0 lb

## 2021-12-25 DIAGNOSIS — M545 Low back pain, unspecified: Secondary | ICD-10-CM

## 2021-12-25 MED ORDER — METHYLPREDNISOLONE 4 MG PO TBPK
ORAL_TABLET | ORAL | 0 refills | Status: DC
  Filled 2021-12-25: qty 21, 6d supply, fill #0

## 2021-12-25 NOTE — Therapy (Signed)
?OUTPATIENT PHYSICAL THERAPY TREATMENT NOTE ? ? ?Patient Name: Erik Salazar ?MRN: [id number] ?DOB:1950-11-23, 71 y.o., male ?Today's Date: 12/26/2021 ? ?PCP: Marrian Salvage, [place] ?REFERRING PROVIDER: Kary Kos MD ? ? PT End of Session - 12/26/21 0805   ? ? Visit Number 2   ? Number of Visits 12   ? Date for PT Re-Evaluation 02/02/22   ? Authorization Type Aetna Medicare   ? Progress Note Due on Visit 10   ? PT Start Time 0802   ? PT Stop Time 0845   ? PT Time Calculation (min) 43 min   ? Activity Tolerance Patient tolerated treatment well   ? Behavior During Therapy [facility] for tasks assessed/performed   ? ?  ?  ? ?  ? ? ?Past Medical History:  ?Diagnosis Date  ? Arthritis   ? Carpal tunnel syndrome on right 07/08/2017  ? Cervical radiculopathy at C6 11/14/2017  ? Right  ? Complication of anesthesia   ? Coronary artery disease   ? Enlarged prostate   ? H/O: knee surgery   ? 15   ? History of blood clots   ? History of open heart surgery   ? ASCENDING AORTIC ANEURYSM  ? Ischemic stroke of frontal lobe ([place]) 03/14/2017  ? Bilateral; post-redo CT surgery  ? Pneumonia   ? PONV (postoperative nausea and vomiting)   ? only after CABG surgeries  ? Seizure disorder ([place]) 03/14/2017  ? Seizures ([place])   ? Status post knee surgery   ? DVT POST KNEE SURGERY  ? Stroke [facility])   ? ?Past Surgical History:  ?Procedure Laterality Date  ? ANTERIOR CERVICAL DECOMP/DISCECTOMY FUSION N/A 05/04/2020  ? Procedure: Anterior Cervical Decompression/Discectomy Fuion Cervical three-four, Cervical four-five, Cervical five-six;  Surgeon: Kary Kos, MD;  Location: [place];  Service: Neurosurgery;  Laterality: N/A;  ? BALLOON DILATION N/A 06/23/2019  ? Procedure: BALLOON DILATION;  Surgeon: Otis Brace, MD;  [facility];  Service: Gastroenterology;  Laterality: N/A;  ? BENTALL PROCEDURE  01/04/2016  ? Bentall with 23 mm pericardial AVR; SVG-LAD, SVG-CX ([place])  ? BICEPS TENDON REPAIR Right   ? BIOPSY   06/23/2019  ? Procedure: BIOPSY;  Surgeon: Otis Brace, MD;  [facility];  Service: Gastroenterology;;  ? CARDIAC SURGERY    ? ANUERSYM MAY 2017   Bentall procedure. Bioprosthetic aortic valve #23 mm bovine model #2700 TF ask, and 28 mm Gelweave woven vascular sinus of Valsalva graft  ? CARPAL TUNNEL RELEASE  08/2018  ? right hand   ? CORONARY ARTERY BYPASS GRAFT  01/04/2016  ? VG to LAD & VG to LCX  ? CORONARY ARTERY BYPASS GRAFT  03/13/2017  ? LIMA to LAD with steril abcess removal from dacron graft  ? ESOPHAGOGASTRODUODENOSCOPY    ? Had done dilatation done about 2 or 3 times before in [place]  ? ESOPHAGOGASTRODUODENOSCOPY (EGD) WITH PROPOFOL N/A 06/23/2019  ? Procedure: ESOPHAGOGASTRODUODENOSCOPY (EGD) WITH PROPOFOL;  Surgeon: Otis Brace, MD;  [facility];  Service: Gastroenterology;  Laterality: N/A;  ? FALSE ANEURYSM REPAIR  03/13/2017  ? redo sternotomy, sterile abscess removal from Dacron graft, omental flap around aorta, CABG: LIMA-LAD ([name], Dr. Mart Piggs)  ? GREATER OMENTAL FLAP CLOSURE  2018  ? Of pericardium 2018  ? INSERTION OF MESH  10/14/2020  ? Procedure: INSERTION OF MESH;  Surgeon: Michael Boston, MD;  Location: [place];  Service: General;;  ? knee surgeries    ? 13 surgeries on knee done  before knee replacement   ? KNEE SURGERY  1983  ? LAPAROSCOPIC LYSIS OF ADHESIONS N/A 05/19/2021  ? Procedure: LYSIS OF ADHESIONS;  Surgeon: Michael Boston, MD;  Location: [place];  Service: General;  Laterality: N/A;  GEN & LOCAL  ? LYSIS OF ADHESION N/A 10/14/2020  ? Procedure: LYSIS OF ADHESION;  Surgeon: Michael Boston, MD;  Location: [place];  Service: General;  Laterality: N/A;  ? open heart surgery    ? 03-13-2017  ? REPLACEMENT TOTAL KNEE  2015  ? ROTATOR CUFF REPAIR    ? TONSILLECTOMY    ? removed as a child.  ? VENTRAL HERNIA REPAIR N/A 10/14/2020  ? Procedure: LAPAROSCOPIC VENTRAL HERNIA REPAIR WITH TAP BLOCK BILATERAL;  Surgeon: Michael Boston, MD;  Location: [place];  Service:  General;  Laterality: N/A;  ? VENTRAL HERNIA REPAIR N/A 05/19/2021  ? Procedure: LAPAROSCOPIC VENTRAL WALL HERNIA REPAIR;  Surgeon: Michael Boston, MD;  Location: [place];  Service: General;  Laterality: N/A;  ? ?Patient Active Problem List  ? Diagnosis Date Noted  ? Acute right-sided low back pain without sciatica 12/25/2021  ? Aortic atherosclerosis ([place]) 09/04/2021  ? Spondylolisthesis at L4-L5 level 06/28/2021  ? S/P repair of ventral hernia 05/19/2021  ? S/P repair of recurrent ventral hernia 05/19/2021  ? Benign prostatic hyperplasia without lower urinary tract symptoms 10/14/2020  ? Chronic pain 10/14/2020  ? 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 Esophageal dysphagia 10/14/2020  ? Gastroesophageal reflux disease 10/14/2020  ? History of aortic valve replacement with bioprosthetic valve 10/14/2020  ? Hx of aortic aneurysm repair 10/14/2020  ? 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 Thoracic aortic aneurysm without rupture ([place]) 10/14/2020  ? Recurrent incisional hernia 10/14/2020  ? Spinal stenosis in cervical region 05/04/2020  ? 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  ? Cervical radiculopathy at C6 11/14/2017  ? Carpal tunnel syndrome on right 07/08/2017  ? Tremor, essential 07/08/2017  ? Ischemic stroke of frontal lobe ([place]) 04/05/2017  ? Pain in right hand 04/05/2017  ? 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 03/27/2017  ? Seizure ([place]) 03/14/2017  ? 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/13/2017  ? 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 Essential hypertension 02/26/2017  ? Mixed hyperlipidemia 02/26/2017  ? ? ?REFERRING DIAG: M43.16 (ICD-10-CM) - Spondylolisthesis, lumbar region ? ?THERAPY DIAG:  ?Acute left-sided low back pain with left-sided sciatica ? ?Muscle weakness (generalized) ? ?Cramp and spasm ? ?Other muscle spasm ? ?PERTINENT HISTORY: s/p lumbar fusion L5/S1 06/28/2021; chronic pain,  stroke, HTN, dysphagia, gerd, tremor, seizure, cervical stenosis,  surgery - aortic  aneurism repair, hernia repair, aortic valve repair, omental flap graft to mediastinum  ? ?PRECAUTIONS: fall (knee buckling) ? ?SUBJECTIVE: Pt. Reports he was given prednisone dose pack by MD yesterday for the pain, didn't sleep well last night, already walked 1.5 miles this AM.   R knee buckled yesterday again.   ? ?PAIN:  ?Are you having pain? Yes: NPRS scale: 6/10 ?Pain location: back ? ? ?OBJECTIVE: (objective measures completed at initial evaluation unless otherwise dated) ? ? FOTO 58%( 49% adjusted), predicted outcome 60% after 12 visits.    ?   ?Sensation  ?Light Touch Appears Intact   ?   ?Posture/Postural Control  ?Posture/Postural Control Postural limitations   ?Postural Limitations Decreased lumbar lordosis   ?   ?ROM / Strength  ?AROM / PROM / Strength AROM;PROM;Strength   ?   ?AROM  ?Overall AROM  Deficits;Due to pain   ?  AROM Assessment Site Lumbar;Knee   ?Right/Left Knee Left   ?Left Knee Extension -25   ?Lumbar Flexion limited 50%, increased pain, noted knee flexion with flexion   ?Lumbar Extension limited 100%   ?Lumbar - Right Side Bend limited 75%   ?Lumbar - Left Side Bend limited 50%   ?Lumbar - Right Rotation slighlty more than L rotation, but increased LBP on L side   ?Lumbar - Left Rotation immediate increase in sharp R sided LBP, limited 75%   ?   ?PROM  ?Overall PROM  Deficits;Due to pain   ?Overall PROM Comments very limited hip mobility, all directions, due ot pain.   ?   ?Strength  ?Overall Strength Within functional limits for tasks performed   ?Overall Strength Comments decreased core strength, history abdominal surgeries, hernia repairs.  Bil LE 5/5 all myotomes.   ?   ?Flexibility  ?Soft Tissue Assessment /Muscle Length yes   ?Hamstrings tightness bil HS   ?Piriformis tightness bil   ?Quadratus Lumborum tightness bil   ?   ?Palpation  ?Spinal mobility hypomobility in lumbar spine (history lumbar fusion L5-S1), tenderness throughout lumbar   ?Palpation comment tenderness bil QL, lumbar  paraspinals, glutes and piriformis, Left worse than Right   ?   ?Special Tests  ?Other special tests SLR 45 deg bil with increased pain   ? ? ?TODAY'S TREATMENT:  ?Therapeutic Exercise: to improve strength and mob

## 2021-12-25 NOTE — Telephone Encounter (Signed)
Document put at front office tray under providers name.  ?

## 2021-12-25 NOTE — Assessment & Plan Note (Signed)
New. Will rx with medrol dose pak. Declines any additional pain medication.  ?

## 2021-12-25 NOTE — Progress Notes (Signed)
? ?Subjective:  ? ?By signing my name below, I, Erik Salazar, attest that this documentation has been prepared under the direction and in the presence of Debbrah Alar, NP 12/25/2021  ? ? ? Patient ID: Erik Salazar, male    DOB: Aug 23, 1950, 71 y.o.   MRN: [id number] ? ?Chief Complaint  ?Patient presents with  ? Back Pain  ?  History of back pain and back surgery, "pain worse in the last week"  ? ? ?Back Pain ?Pertinent negatives include no dysuria, headaches or weakness.  ?Patient is in today for an office visit  ? ?Back pain- He reports of back pain that flared up this weekend. He had a back surgery last year. He reports the pain is sharp and centralized in the right side, radiates down to his leg sometimes. He has a history of knee surgery and will be getting a right knee arthroplasty soon. Denies weakness and loss of bladder control. He has started wearing a back brace. He had a left knee arthroplasty and recently tore his scar tissue. ? ?Past Medical History:  ?Diagnosis Date  ? Arthritis   ? Carpal tunnel syndrome on right 07/08/2017  ? Cervical radiculopathy at C6 11/14/2017  ? Right  ? Complication of anesthesia   ? Coronary artery disease   ? Enlarged prostate   ? H/O: knee surgery   ? 15   ? History of blood clots   ? History of open heart surgery   ? ASCENDING AORTIC ANEURYSM  ? Ischemic stroke of frontal lobe ([name]) 03/14/2017  ? Bilateral; post-redo CT surgery  ? Pneumonia   ? PONV (postoperative nausea and vomiting)   ? only after CABG surgeries  ? Seizure disorder ([place]) 03/14/2017  ? Seizures ([place])   ? Status post knee surgery   ? DVT POST KNEE SURGERY  ? Stroke [facility])   ? ? ?Past Surgical History:  ?Procedure Laterality Date  ? ANTERIOR CERVICAL DECOMP/DISCECTOMY FUSION N/A 05/04/2020  ? Procedure: Anterior Cervical Decompression/Discectomy Fuion Cervical three-four, Cervical four-five, Cervical five-six;  Surgeon: Kary Kos, MD;  Location: [place];  Service: Neurosurgery;  Laterality: N/A;  ?  BALLOON DILATION N/A 06/23/2019  ? Procedure: BALLOON DILATION;  Surgeon: Otis Brace, MD;  [facility];  Service: Gastroenterology;  Laterality: N/A;  ? BENTALL PROCEDURE  01/04/2016  ? Bentall with 23 mm pericardial AVR; SVG-LAD, SVG-CX ([place])  ? BICEPS TENDON REPAIR Right   ? BIOPSY  06/23/2019  ? Procedure: BIOPSY;  Surgeon: Otis Brace, MD;  [facility];  Service: Gastroenterology;;  ? CARDIAC SURGERY    ? ANUERSYM MAY 2017   Bentall procedure. Bioprosthetic aortic valve #23 mm bovine model #2700 TF ask, and 28 mm Gelweave woven vascular sinus of Valsalva graft  ? CARPAL TUNNEL RELEASE  08/2018  ? right hand   ? CORONARY ARTERY BYPASS GRAFT  01/04/2016  ? VG to LAD & VG to LCX  ? CORONARY ARTERY BYPASS GRAFT  03/13/2017  ? LIMA to LAD with steril abcess removal from dacron graft  ? ESOPHAGOGASTRODUODENOSCOPY    ? Had done dilatation done about 2 or 3 times before in [place]  ? ESOPHAGOGASTRODUODENOSCOPY (EGD) WITH PROPOFOL N/A 06/23/2019  ? Procedure: ESOPHAGOGASTRODUODENOSCOPY (EGD) WITH PROPOFOL;  Surgeon: Otis Brace, MD;  [facility];  Service: Gastroenterology;  Laterality: N/A;  ? FALSE ANEURYSM REPAIR  03/13/2017  ? redo sternotomy, sterile abscess removal from Dacron graft, omental flap around aorta, CABG: LIMA-LAD ([name], Dr. Mart Piggs)  ?  GREATER OMENTAL FLAP CLOSURE  2018  ? Of pericardium 2018  ? INSERTION OF MESH  10/14/2020  ? Procedure: INSERTION OF MESH;  Surgeon: Michael Boston, MD;  Location: [place];  Service: General;;  ? knee surgeries    ? 13 surgeries on knee done before knee replacement   ? KNEE SURGERY  1983  ? LAPAROSCOPIC LYSIS OF ADHESIONS N/A 05/19/2021  ? Procedure: LYSIS OF ADHESIONS;  Surgeon: Michael Boston, MD;  Location: [place];  Service: General;  Laterality: N/A;  GEN & LOCAL  ? LYSIS OF ADHESION N/A 10/14/2020  ? Procedure: LYSIS OF ADHESION;  Surgeon: Michael Boston, MD;  Location: [place];  Service:  General;  Laterality: N/A;  ? open heart surgery    ? 03-13-2017  ? REPLACEMENT TOTAL KNEE  2015  ? ROTATOR CUFF REPAIR    ? TONSILLECTOMY    ? removed as a child.  ? VENTRAL HERNIA REPAIR N/A 10/14/2020  ? Procedure: LAPAROSCOPIC VENTRAL HERNIA REPAIR WITH TAP BLOCK BILATERAL;  Surgeon: Michael Boston, MD;  Location: [place];  Service: General;  Laterality: N/A;  ? VENTRAL HERNIA REPAIR N/A 05/19/2021  ? Procedure: LAPAROSCOPIC VENTRAL WALL HERNIA REPAIR;  Surgeon: Michael Boston, MD;  Location: [place];  Service: General;  Laterality: N/A;  ? ? ?Family History  ?Problem Relation Age of Onset  ? Arthritis Mother   ? Aneurysm Mother   ?     brain aneurysm for mother.   ? Arthritis Father   ? Colon cancer Neg Hx   ? Esophageal cancer Neg Hx   ? ? ?Social History  ? ?Socioeconomic History  ? Marital status: Married  ?  Spouse name: Not on file  ? Number of children: Not on file  ? Years of education: Not on file  ? Highest education level: Not on file  ?Occupational History  ? Occupation: RETIRED  ?Tobacco Use  ? Smoking status: Former  ?  Types: Cigars  ? Smokeless tobacco: Never  ?Vaping Use  ? Vaping Use: Never used  ?Substance and Sexual Activity  ? Alcohol use: Yes  ?  Alcohol/week: 1.0 standard drink  ?  Types: 1 Cans of beer per week  ?  Comment: occassionally  ? Drug use: No  ? Sexual activity: Not on file  ?Other Topics Concern  ? Not on file  ?Social History Narrative  ? Lives at home with wife, is retired.  Education: 2 yrs college.   2 Children.  ? ?Social Determinants of Health  ? ?Financial Resource Strain: Low Risk   ? Difficulty of Paying Living Expenses: Not hard at all  ?Food Insecurity: No Food Insecurity  ? Worried About Charity fundraiser in the Last Year: Never true  ? Ran Out of Food in the Last Year: Never true  ?Transportation Needs: No Transportation Needs  ? Lack of Transportation (Medical): No  ? Lack of Transportation (Non-Medical): No  ?Physical Activity: Sufficiently Active  ? Days of Exercise  per Week: 7 days  ? Minutes of Exercise per Session: 60 min  ?Stress: No Stress Concern Present  ? Feeling of Stress : Not at all  ?Social Connections: Socially Integrated  ? Frequency of Communication with Friends and Family: More than three times a week  ? Frequency of Social Gatherings with Friends and Family: More than three times a week  ? Attends Religious Services: More than 4 times per year  ? Active Member of Clubs or Organizations: Yes  ? Attends  Club or Organization Meetings: More than 4 times per year  ? Marital Status: Married  ?Intimate Partner Violence: Not At Risk  ? Fear of Current or Ex-Partner: No  ? Emotionally Abused: No  ? Physically Abused: No  ? Sexually Abused: No  ? ? ?Outpatient Medications Prior to Visit  ?Medication Sig Dispense Refill  ? ascorbic acid (VITAMIN C) 500 MG tablet Take 500 mg by mouth daily.    ? aspirin EC 81 MG tablet Take 81 mg by mouth daily. Swallow whole.    ? Cholecalciferol (VITAMIN D3) 50 MCG (2000 UT) TABS Take 2,000 Units by mouth daily.    ? levETIRAcetam (KEPPRA) 1000 MG tablet Take 1 tablet (1,000 mg total) by mouth 2 (two) times daily. 180 tablet 4  ? levocetirizine (XYZAL) 5 MG tablet Take 1 tablet (5 mg total) by mouth every evening. 30 tablet 5  ? levofloxacin (LEVAQUIN) 500 MG tablet Take 1 tablet (500 mg total) by mouth daily for 7 days. 7 tablet 0  ? metoprolol succinate (TOPROL-XL) 25 MG 24 hr tablet Take 1/2 tablet (12.5 mg total) by mouth daily. With or immediately following a meal. 45 tablet 0  ? pravastatin (PRAVACHOL) 40 MG tablet Take 1 tablet (40 mg total) by mouth every evening. 90 tablet 3  ? pyridOXINE (VITAMIN B-6) 100 MG tablet Take 100 mg by mouth daily.    ? sildenafil (REVATIO) 20 MG tablet Take 2 -3 tablets by mouth once daily as needed 30 tablet 1  ? Sodium Sulfate-Mag Sulfate-KCl (SUTAB) [phone] MG TABS Take as directed for colonoscopy prep 24 tablet 0  ? tamsulosin (FLOMAX) 0.4 MG CAPS capsule Take 0.8 mg by mouth every evening.     ? tamsulosin (FLOMAX) 0.4 MG CAPS capsule Take 2 capsules by mouth everyday 180 capsule 1  ? triamcinolone (NASACORT) 55 MCG/ACT AERO nasal inhaler Place 1 spray into affected nostril 2 times daily. 16.9 mL 3

## 2021-12-25 NOTE — Patient Instructions (Signed)
Please begin medrol dose pak for back pain. Call if symptoms worsen or if symptoms are not improved in 1 week. ?

## 2021-12-25 NOTE — Telephone Encounter (Signed)
Pt dropped off copy of his Living Will -4 pages, so provider and see and have on pt's chart.  ?

## 2021-12-26 ENCOUNTER — Ambulatory Visit: Payer: Medicare HMO | Admitting: Physical Therapy

## 2021-12-26 ENCOUNTER — Encounter: Payer: Self-pay | Admitting: Physical Therapy

## 2021-12-26 DIAGNOSIS — M5442 Lumbago with sciatica, left side: Secondary | ICD-10-CM | POA: Diagnosis not present

## 2021-12-26 DIAGNOSIS — M6281 Muscle weakness (generalized): Secondary | ICD-10-CM

## 2021-12-26 DIAGNOSIS — R252 Cramp and spasm: Secondary | ICD-10-CM

## 2021-12-26 DIAGNOSIS — M62838 Other muscle spasm: Secondary | ICD-10-CM

## 2021-12-26 NOTE — Telephone Encounter (Signed)
Forms have been reviewed and looked at. Forms have been given back to [place] for scanning.  ?

## 2021-12-28 ENCOUNTER — Other Ambulatory Visit (HOSPITAL_BASED_OUTPATIENT_CLINIC_OR_DEPARTMENT_OTHER): Payer: Self-pay

## 2021-12-28 MED ORDER — PFIZER COVID-19 VAC BIVALENT 30 MCG/0.3ML IM SUSP
INTRAMUSCULAR | 0 refills | Status: DC
  Filled 2021-12-28: qty 0.3, 1d supply, fill #0

## 2021-12-29 ENCOUNTER — Telehealth: Payer: Self-pay | Admitting: Orthopaedic Surgery

## 2021-12-29 NOTE — Telephone Encounter (Signed)
I called patient to offer a surgery date for right knee-partial lateral meniscectomy at [place], but patient declined scheduling.  He stated [facility] would not permit him to have surgery there because of his complex medical history.  He stated he would prefer to talk with Dr. Erlinda Hong and discuss right knee scope vs right total knee. Appointment set for 01-04-22 at 10am with Dr. Erlinda Hong.   Patient will be seeing his cardiologist 8am on the same day.  Clearance sent to Dr. Candee Furbish on 12-05-21, but may need to be revised if patient decides to go with the total.   ?

## 2022-01-01 ENCOUNTER — Encounter: Payer: Medicare HMO | Admitting: Physical Therapy

## 2022-01-04 ENCOUNTER — Ambulatory Visit (INDEPENDENT_AMBULATORY_CARE_PROVIDER_SITE_OTHER): Payer: Medicare HMO | Admitting: Orthopaedic Surgery

## 2022-01-04 ENCOUNTER — Encounter: Payer: Self-pay | Admitting: Cardiology

## 2022-01-04 ENCOUNTER — Other Ambulatory Visit: Payer: Self-pay

## 2022-01-04 ENCOUNTER — Ambulatory Visit (INDEPENDENT_AMBULATORY_CARE_PROVIDER_SITE_OTHER): Payer: Medicare HMO | Admitting: Cardiology

## 2022-01-04 ENCOUNTER — Ambulatory Visit (INDEPENDENT_AMBULATORY_CARE_PROVIDER_SITE_OTHER): Payer: Medicare HMO

## 2022-01-04 VITALS — BP 126/80 | HR 74 | Ht 70.0 in | Wt 204.0 lb

## 2022-01-04 DIAGNOSIS — Z9889 Other specified postprocedural states: Secondary | ICD-10-CM

## 2022-01-04 DIAGNOSIS — Z8679 Personal history of other diseases of the circulatory system: Secondary | ICD-10-CM

## 2022-01-04 DIAGNOSIS — Z789 Other specified health status: Secondary | ICD-10-CM | POA: Diagnosis not present

## 2022-01-04 DIAGNOSIS — I7121 Aneurysm of the ascending aorta, without rupture: Secondary | ICD-10-CM

## 2022-01-04 DIAGNOSIS — I2510 Atherosclerotic heart disease of native coronary artery without angina pectoris: Secondary | ICD-10-CM | POA: Diagnosis not present

## 2022-01-04 DIAGNOSIS — M1711 Unilateral primary osteoarthritis, right knee: Secondary | ICD-10-CM | POA: Diagnosis not present

## 2022-01-04 DIAGNOSIS — Z01810 Encounter for preprocedural cardiovascular examination: Secondary | ICD-10-CM | POA: Insufficient documentation

## 2022-01-04 DIAGNOSIS — Z79899 Other long term (current) drug therapy: Secondary | ICD-10-CM

## 2022-01-04 DIAGNOSIS — E782 Mixed hyperlipidemia: Secondary | ICD-10-CM

## 2022-01-04 NOTE — Patient Instructions (Signed)
Medication Instructions:  The current medical regimen is effective;  continue present plan and medications.  *If you need a refill on your cardiac medications before your next appointment, please call your pharmacy*  You have been referred to our [facility] here at [place]: At [facility], you and your health needs are our priority.  As part of our continuing mission to provide you with exceptional heart care, we have created designated Provider Care Teams.  These Care Teams include your primary Cardiologist (physician) and Advanced Practice Providers (APPs -  Physician Assistants and Nurse Practitioners) who all work together to provide you with the care you need, when you need it.  We recommend signing up for the patient portal called "MyChart".  Sign up information is provided on this After Visit Summary.  MyChart is used to connect with patients for Virtual Visits (Telemedicine).  Patients are able to view lab/test results, encounter notes, upcoming appointments, etc.  Non-urgent messages can be sent to your provider as well.   To learn more about what you can do with MyChart, go to [email or web address].    Your next appointment:   1 year(s)  The format for your next appointment:   In Person  Provider:   Candee Furbish, MD {   Important Information About Sugar

## 2022-01-04 NOTE — Assessment & Plan Note (Signed)
Bypass.  Previously in 2018 sterile abscess was removed.  Currently having no anginal symptoms.  Aggressive risk factor prevention.

## 2022-01-04 NOTE — Assessment & Plan Note (Signed)
He may proceed with knee surgery with low to moderate overall cardiac risk.  He has prior bypass prior aortic repair, valve.  If possible maintain aspirin throughout surgery.  If this is felt to be too high risk for bleeding stopped for limited time.

## 2022-01-04 NOTE — Progress Notes (Signed)
Office Visit Note   Patient: Erik Salazar           Date of Birth: [birth year]           MRN: [id number] Visit Date: 01/04/2022              Requested by: Marrian Salvage, [place] [facility] [address],  [place] [address] PCP: Marrian Salvage, FNP   Assessment & Plan: Visit Diagnoses:  1. Primary osteoarthritis of right knee     Plan: Impression is end-stage right knee tricompartmental DJD.  Based on his option of arthroscopic debridement and total knee replacement I do agree that a total knee replacement would be more reliable for pain relief and would minimize the risk of additional surgery.  Based on his options he is in agreement with moving forward with a total knee replacement.  He has done well from his left knee replacement which was done several years back in [place].  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  We will need to make sure that this is okay with Dr. [name] for him to be on Xarelto.  He will continue to take his baby aspirin.  He does have hallucinations with oxycodone and has a history of seizures therefore I do not think tramadol is a good option for postoperative pain relief.  We may have to manage with mainly Tylenol and could consider hydrocodone.  He saw Dr. Marlou Porch today who provided clearance to have surgery.  Denies nickel allergy.  Total face to face encounter time was greater than 25 minutes and over half of this time was spent in counseling and/or coordination of care.  Follow-Up Instructions: No follow-ups on file.   Orders:  Orders Placed This Encounter  Procedures   XR KNEE 3 VIEW RIGHT   No orders of the defined types were placed in this encounter.     Procedures: No procedures performed   Clinical Data: No additional findings.   Subjective: Chief Complaint  Patient presents with   Right Knee - Pain    HPI [name] is 71 year old gentleman  well-known to me comes in for further discussion on his right knee pain.  He has been doing some thinking and has decided on a total knee replacement.  Review of Systems  Constitutional: Negative.   All other systems reviewed and are negative.   Objective: Vital Signs: There were no vitals taken for this visit.  Physical Exam Vitals and nursing note reviewed.  Constitutional:      Appearance: He is well-developed.  Pulmonary:     Effort: Pulmonary effort is normal.  Abdominal:     Palpations: Abdomen is soft.  Skin:    General: Skin is warm.  Neurological:     Mental Status: He is alert and oriented to person, place, and time.  Psychiatric:        Behavior: Behavior normal.        Thought Content: Thought content normal.        Judgment: Judgment normal.    Ortho Exam Examination of right knee is unchanged. Specialty Comments:  No specialty comments available.  Imaging: XR KNEE 3 VIEW RIGHT  Result Date: 01/04/2022 Tricompartmental osteoarthritis.  Generalized osteopenia.    PMFS History: Patient Active Problem List   Diagnosis Date Noted   Coronary artery disease involving native coronary artery of native heart without angina pectoris 01/04/2022  Preop cardiovascular exam 01/04/2022   Primary osteoarthritis of right knee 01/04/2022   Acute right-sided low back pain without sciatica 12/25/2021   Aortic atherosclerosis ([place]) 09/04/2021   Spondylolisthesis at L4-L5 level 06/28/2021   S/P repair of ventral hernia 05/19/2021   S/P repair of recurrent ventral hernia 05/19/2021   Benign prostatic hyperplasia without lower urinary tract symptoms 10/14/2020   Chronic pain 10/14/2020   ED (erectile dysfunction) of organic origin 10/14/2020   Esophageal dysphagia 10/14/2020   Gastroesophageal reflux disease 10/14/2020   History of aortic valve replacement with bioprosthetic valve 10/14/2020   Hx of aortic aneurysm repair 10/14/2020   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/2020   Thoracic aortic aneurysm without rupture ([place]) 10/14/2020   Recurrent incisional hernia 10/14/2020   Spinal stenosis in cervical region 05/04/2020   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   Cervical radiculopathy at C6 11/14/2017   Carpal tunnel syndrome on right 07/08/2017   Tremor, essential 07/08/2017   Ischemic stroke of frontal lobe ([place]) 04/05/2017   Pain in right hand 04/05/2017   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 03/27/2017   Seizure ([place]) 03/14/2017   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/13/2017   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   Essential hypertension 02/26/2017   Mixed hyperlipidemia 02/26/2017   Past Medical History:  Diagnosis Date   Arthritis    Carpal tunnel syndrome on right 07/08/2017   Cervical radiculopathy at C6 40/97/3532   Right   Complication of anesthesia    Coronary artery disease    Enlarged prostate    H/O: knee surgery    15    History of blood clots    History of open heart surgery    ASCENDING AORTIC ANEURYSM   Ischemic stroke of frontal lobe ([place]) 03/14/2017   Bilateral; post-redo CT surgery   Pneumonia    PONV (postoperative nausea and vomiting)    only after CABG surgeries   Seizure disorder ([place]) 03/14/2017   Seizures ([name])    Status post knee surgery    DVT POST KNEE SURGERY   Stroke [facility]     Family History  Problem Relation Age of Onset   Arthritis Mother    Aneurysm Mother        brain aneurysm for mother.    Arthritis Father    Colon cancer Neg Hx    Esophageal cancer Neg Hx     Past Surgical History:  Procedure Laterality Date   ANTERIOR CERVICAL DECOMP/DISCECTOMY FUSION N/A 05/04/2020   Procedure: Anterior Cervical Decompression/Discectomy Fuion Cervical three-four, Cervical four-five, Cervical five-six;  Surgeon: Kary Kos, MD;  Location: [place];  Service: Neurosurgery;  Laterality: N/A;   BALLOON DILATION N/A 06/23/2019   Procedure: BALLOON DILATION;  Surgeon: Otis Brace, MD;   [facility];  Service: Gastroenterology;  Laterality: N/A;   BENTALL PROCEDURE  01/04/2016   Bentall with 23 mm pericardial AVR; SVG-LAD, SVG-CX ([place])   BICEPS TENDON REPAIR Right    BIOPSY  06/23/2019   Procedure: BIOPSY;  Surgeon: Otis Brace, MD;  [facility];  Service: Gastroenterology;;   CARDIAC SURGERY     ANUERSYM MAY 2017   Bentall procedure. Bioprosthetic aortic valve #23 mm bovine model #2700 TF ask, and 28 mm Gelweave woven vascular sinus of Valsalva graft   CARPAL TUNNEL RELEASE  08/2018   right hand    CORONARY ARTERY BYPASS GRAFT  01/04/2016   VG to LAD & VG to LCX   CORONARY ARTERY BYPASS  GRAFT  03/13/2017   LIMA to LAD with steril abcess removal from dacron graft   ESOPHAGOGASTRODUODENOSCOPY     Had done dilatation done about 2 or 3 times before in [place]   ESOPHAGOGASTRODUODENOSCOPY (EGD) WITH PROPOFOL N/A 06/23/2019   Procedure: ESOPHAGOGASTRODUODENOSCOPY (EGD) WITH PROPOFOL;  Surgeon: Otis Brace, MD;  [facility];  Service: Gastroenterology;  Laterality: N/A;   FALSE ANEURYSM REPAIR  03/13/2017   redo sternotomy, sterile abscess removal from Dacron graft, omental flap around aorta, CABG: LIMA-LAD ([name], Dr. Mart Piggs)   [place]  2018   Of pericardium 2018   INSERTION OF MESH  10/14/2020   Procedure: INSERTION OF MESH;  Surgeon: Michael Boston, MD;  Location: [place];  Service: General;;   knee surgeries     13 surgeries on knee done before knee replacement    [place] N/A 05/19/2021   Procedure: LYSIS OF ADHESIONS;  Surgeon: Michael Boston, MD;  Location: [place];  Service: General;  Laterality: N/A;  GEN & LOCAL   LYSIS OF ADHESION N/A 10/14/2020   Procedure: LYSIS OF ADHESION;  Surgeon: Michael Boston, MD;  Location: [place];  Service: General;  Laterality: N/A;   open heart surgery     03-13-2017   REPLACEMENT TOTAL KNEE  2015    ROTATOR CUFF REPAIR     TONSILLECTOMY     removed as a child.   VENTRAL HERNIA REPAIR N/A 10/14/2020   Procedure: LAPAROSCOPIC VENTRAL HERNIA REPAIR WITH TAP BLOCK BILATERAL;  Surgeon: Michael Boston, MD;  Location: [place];  Service: General;  Laterality: N/A;   VENTRAL HERNIA REPAIR N/A 05/19/2021   Procedure: LAPAROSCOPIC VENTRAL WALL HERNIA REPAIR;  Surgeon: Michael Boston, MD;  Location: [place];  Service: General;  Laterality: N/A;   Social History   Occupational History   Occupation: RETIRED  Tobacco Use   Smoking status: Former    Types: Cigars   Smokeless tobacco: Never  Vaping Use   Vaping Use: Never used  Substance and Sexual Activity   Alcohol use: Yes    Alcohol/week: 1.0 standard drink    Types: 1 Cans of beer per week    Comment: occassionally   Drug use: No   Sexual activity: Not on file

## 2022-01-04 NOTE — Assessment & Plan Note (Signed)
Bentall procedure.  As described above.

## 2022-01-04 NOTE — Assessment & Plan Note (Signed)
Post repair, Dr. [name] monitoring closely.  Recent CT reviewed as above.  Dr. Thayer Ohm note reviewed as well.  Continuing to monitor.

## 2022-01-04 NOTE — Progress Notes (Signed)
Cardiology Office Note:    Date:  01/04/2022   ID:  Erik Salazar, DOB 06-Apr-1951, MRN [id number]  PCP:  Marrian Salvage, [place]  Cardiologist:  Candee Furbish, MD  Advanced Practice Provider:  No care team member to display Electrophysiologist:  None       Referring MD: Marrian Salvage,*     History of Present Illness:    Erik Salazar is a 71 y.o. male here for the follow-up of coronary artery disease, CABG, and AVR/Bentall. Also presents for surgical clearance for a right knee partial lateral meniscectomy to be performed by Dr. Erlinda Hong.  Resection of a ascending aneurysm with Bentall procedure and CABG  in [place] in 2017. After moving here, a follow up CTA bu Dr. 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  It measures over 6 cm in length.  Patient asked for a second opinion at [facility].  The patient subsequently underwent redo sternotomy in the summer 2018 at [facility].  A 9 cm abscess collection along the graft was found.  There is no false aneurysm.  Gram stain of this area was negative.  The abscess was debrided and the space was filled with omental flap performed at the same time by plastic surgery.  Patient also had a repeat coronary graft using left IMA to LAD for a stenotic vein graft from the original operation.   He had a postoperative CVA with expressive aphasia and some swallowing difficulty.  He is being followed here by Dr. [name].  He has undergone physical therapy and speech therapy.  He has improved and now has a fairly good functional status. He has declined follow-up at [facility] and wishes to be followed [facility] for his aortic cardiac surgery.   Follow up CT shows resolution of abscess (had dye extravasation). ECHO with normal AV function. Plan is to have one year CTA by Dr. [name].    Dr. [name] has given him a disability placard. He indicated that he has trouble locating his car  secondary to memory problems. He said he had no problem driving. He was instructed to taper off Keppra.   10/28/17-sinus rhythm 80 with occasional bigeminy noted on his EKG. he is doing well exercising 6 days a week, no syncope, no orthopnea, no chest pain.  He did still state that he has some numbness in his first 3 finger distribution.  Wearing a brace.   10/28/2018- he is here for follow-up of ascending aneurysm, CAD CABG, stroke.  On 05/05/2018 he was in the emergency department with shortness of breath and chest pain.  Arrived as a daily issue in the substernal region moderate.  CT scan was performed as well as troponin BMP and EKG.  These results were reviewed and he had follow-up with Dr. [name].Marland Kitchen  He reviewed CT images with he and family.  Right hand weakness poor grip.  No symptoms of angina or CHF.  Small ventral hernia noted below xiphoid.  Not impressive on CT scan.  Aortic repair looks fine.  Dr. Amedeo Plenty evaluated hand.  Had carpal tunnel procedure. Gym 5 days weights.  Doing very well without any difficulty.  Using light weights.  Maintaining muscle mass.    11/06/2019-here for follow-up of ascending aortic aneurysm repair.  Back on 07/29/2019 he did go to the emergency room after feeling some left upper back pain.  CT scan was negative for any changes to aneurysm.  Descending aorta  infrarenal dissection noted.  Medically managed.  Kidney cyst noted.  No changes.  Overall doing well with his blood pressure control.  He feels well.  His wife unfortunately slipped on water in the garage hit her head and had a brain bleed.  It has been several weeks and she still cannot taste or smell.  This was not related to Covid.  11/14/20: here for the follow up of TAA repair (with redo sternotomy). Denies any fevers chills nausea vomiting.  Overall been feeling well.  Did have ventral hernia repair large mesh placed with Dr. Johney Maine.  Also had cervical spine discectomies.  Arms are functioning, hands still numb.   Exercising, walking 5K a day.  Lost 15 pounds.  Today: Here for follow-up of CAD, CABG, and AVR. Also presents for surgical clearance for a right knee partial lateral meniscectomy to be performed by Dr. Erlinda Hong.  Last year he had a busy year with 4 surgeries. At this time he denies any chest pain or anginal symptoms. No complaints of shortness of breath.   Since last year he continues to exercise routinely on the treadmill, up to 5 km with his heart rate up to the 130's.  He remains compliant with his regimen. On pravastatin as he developed myalgias with other statins.  He denies any palpitations, or peripheral edema. No lightheadedness, headaches, syncope, orthopnea, or PND.   Past Medical History:  Diagnosis Date   Arthritis    Carpal tunnel syndrome on right 07/08/2017   Cervical radiculopathy at C6 70/96/2836   Right   Complication of anesthesia    Coronary artery disease    Enlarged prostate    H/O: knee surgery    15    History of blood clots    History of open heart surgery    ASCENDING AORTIC ANEURYSM   Ischemic stroke of frontal lobe ([place]) 03/14/2017   Bilateral; post-redo CT surgery   Pneumonia    PONV (postoperative nausea and vomiting)    only after CABG surgeries   Seizure disorder ([place]) 03/14/2017   Seizures ([place])    Status post knee surgery    DVT POST KNEE SURGERY   Stroke [facility])     Past Surgical History:  Procedure Laterality Date   ANTERIOR CERVICAL DECOMP/DISCECTOMY FUSION N/A 05/04/2020   Procedure: Anterior Cervical Decompression/Discectomy Fuion Cervical three-four, Cervical four-five, Cervical five-six;  Surgeon: Kary Kos, MD;  Location: [place];  Service: Neurosurgery;  Laterality: N/A;   BALLOON DILATION N/A 06/23/2019   Procedure: BALLOON DILATION;  Surgeon: Otis Brace, MD;  Location: [facility];  Service: Gastroenterology;  Laterality: N/A;   BENTALL PROCEDURE  01/04/2016   Bentall with 23 mm pericardial AVR; SVG-LAD, SVG-CX ([place])   BICEPS TENDON REPAIR Right    BIOPSY  06/23/2019   Procedure: BIOPSY;  Surgeon: Otis Brace, MD;  Location: [facility];  Service: Gastroenterology;;   CARDIAC SURGERY     ANUERSYM MAY 2017   Bentall procedure. Bioprosthetic aortic valve #23 mm bovine model #2700 TF ask, and 28 mm Gelweave woven vascular sinus of Valsalva graft   CARPAL TUNNEL RELEASE  08/2018   right hand    CORONARY ARTERY BYPASS GRAFT  01/04/2016   VG to LAD & VG to LCX   CORONARY ARTERY BYPASS GRAFT  03/13/2017   LIMA to LAD with steril abcess removal from dacron graft   ESOPHAGOGASTRODUODENOSCOPY     Had done dilatation done about 2 or 3 times before in [place]  ESOPHAGOGASTRODUODENOSCOPY (EGD) WITH PROPOFOL N/A 06/23/2019   Procedure: ESOPHAGOGASTRODUODENOSCOPY (EGD) WITH PROPOFOL;  Surgeon: Otis Brace, MD;  [facility]: [facility];  Service: Gastroenterology;  Laterality: N/A;   FALSE ANEURYSM REPAIR  03/13/2017   redo sternotomy, sterile abscess removal from Dacron graft, omental flap around aorta, CABG: LIMA-LAD ([name], Dr. Mart Piggs)   [place]  2018   Of pericardium 2018   INSERTION OF MESH  10/14/2020   Procedure: INSERTION OF MESH;  Surgeon: Michael Boston, MD;  Location: [place];  Service: General;;   knee surgeries     13 surgeries on knee done before knee replacement    [place] N/A 05/19/2021   Procedure: LYSIS OF ADHESIONS;  Surgeon: Michael Boston, MD;  Location: [place];  Service: General;  Laterality: N/A;  GEN & LOCAL   LYSIS OF ADHESION N/A 10/14/2020   Procedure: LYSIS OF ADHESION;  Surgeon: Michael Boston, MD;  Location: [place];  Service: General;  Laterality: N/A;   open heart surgery     03-13-2017   REPLACEMENT TOTAL KNEE  2015   ROTATOR CUFF REPAIR     TONSILLECTOMY     removed as a child.   VENTRAL HERNIA REPAIR N/A 10/14/2020   Procedure: LAPAROSCOPIC VENTRAL HERNIA REPAIR WITH TAP BLOCK  BILATERAL;  Surgeon: Michael Boston, MD;  Location: [place];  Service: General;  Laterality: N/A;   VENTRAL HERNIA REPAIR N/A 05/19/2021   Procedure: LAPAROSCOPIC VENTRAL WALL HERNIA REPAIR;  Surgeon: Michael Boston, MD;  Location: [place];  Service: General;  Laterality: N/A;    Current Medications: Current Meds  Medication Sig   ascorbic acid (VITAMIN C) 500 MG tablet Take 500 mg by mouth daily.   aspirin EC 81 MG tablet Take 81 mg by mouth daily. Swallow whole.   Cholecalciferol (VITAMIN D3) 50 MCG (2000 UT) TABS Take 2,000 Units by mouth daily.   COVID-19 mRNA bivalent vaccine, Pfizer, (PFIZER COVID-19 VAC BIVALENT) injection Inject into the muscle.   levETIRAcetam (KEPPRA) 1000 MG tablet Take 1 tablet (1,000 mg total) by mouth 2 (two) times daily.   metoprolol succinate (TOPROL-XL) 25 MG 24 hr tablet Take 1/2 tablet (12.5 mg total) by mouth daily. With or immediately following a meal.   pravastatin (PRAVACHOL) 40 MG tablet Take 1 tablet (40 mg total) by mouth every evening.   pyridOXINE (VITAMIN B-6) 100 MG tablet Take 100 mg by mouth daily.   sildenafil (REVATIO) 20 MG tablet Take 2 -3 tablets by mouth once daily as needed   tamsulosin (FLOMAX) 0.4 MG CAPS capsule Take 2 capsules by mouth everyday     Allergies:   Oxycodone hcl, Zetia [ezetimibe], Augmentin [amoxicillin-pot clavulanate], Chlorhexidine, and Elemental sulfur   Social History   Socioeconomic History   Marital status: Married    Spouse name: Not on file   Number of children: Not on file   Years of education: Not on file   Highest education level: Not on file  Occupational History   Occupation: RETIRED  Tobacco Use   Smoking status: Former    Types: Cigars   Smokeless tobacco: Never  Vaping Use   Vaping Use: Never used  Substance and Sexual Activity   Alcohol use: Yes    Alcohol/week: 1.0 standard drink    Types: 1 Cans of beer per week    Comment: occassionally   Drug use: No   Sexual activity: Not on file   Other Topics Concern  Not on file  Social History Narrative   Lives at home with wife, is retired.  Education: 2 yrs college.   2 Children.   Social Determinants of Health   Financial Resource Strain: Low Risk    Difficulty of Paying Living Expenses: Not hard at all  Food Insecurity: No Food Insecurity   Worried About Charity fundraiser in the Last Year: Never true   [place] in the Last Year: Never true  Transportation Needs: No Transportation Needs   Lack of Transportation (Medical): No   Lack of Transportation (Non-Medical): No  Physical Activity: Sufficiently Active   Days of Exercise per Week: 7 days   Minutes of Exercise per Session: 60 min  Stress: No Stress Concern Present   Feeling of Stress : Not at all  Social Connections: Socially Integrated   Frequency of Communication with Friends and Family: More than three times a week   Frequency of Social Gatherings with Friends and Family: More than three times a week   Attends Religious Services: More than 4 times per year   Active Member of [employer] or Organizations: Yes   Attends Music therapist: More than 4 times per year   Marital Status: Married     Family History: The patient's family history includes Aneurysm in his mother; Arthritis in his father and mother. There is no history of Colon cancer or Esophageal cancer.  ROS:   Please see the history of present illness.    All other systems reviewed and are negative.  EKGs/Labs/Other Studies Reviewed:    The following studies were reviewed today:  CTA Chest/Aorta 09/01/2021: FINDINGS: Cardiovascular: Status post median sternotomy with ascending aortic grafting, aortic valve replacement and CABG. The diameter of the ascending aorta at the sinotubular junction is 3.0 cm on coronal image 75/14, stable. The aortic arch and descending aorta are normal in caliber. There is no aortic dissection. However, there is a small pseudoaneurysm of the aortic  root anteriorly, just inferior to the origin of a patent left coronary artery bypass graft. This is most obvious on coronal images 66 and 67 of series 174 and measures up to 10 mm in diameter. This has enlarged from the previous study. The heart size is normal. There is no pericardial effusion.   Mediastinum/Nodes: There are no enlarged mediastinal, hilar or axillary lymph nodes. The thyroid gland, trachea and esophagus demonstrate no significant findings.   Lungs/Pleura: No pleural effusion or pneumothorax. Mild centrilobular emphysema with chronic bibasilar scarring. No suspicious pulmonary nodule.   Upper abdomen: The visualized upper abdomen appears stable without significant findings. There are stable low-density renal lesions bilaterally which are compatible with cysts.   Musculoskeletal/Chest wall: There is no chest wall mass or suspicious osseous finding.   Review of the MIP images confirms the above findings.   IMPRESSION: 1. 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 2. Otherwise stable appearance of the aorta status post AVR and aortic grafting. No acute vascular findings. 3. Stable mild scarring at both lung bases.  Echo 12/08/2020:  1. Left ventricular ejection fraction, by estimation, is 55 to 60%. Left  ventricular ejection fraction by 3D volume is 60 %. The left ventricle has  normal function. The left ventricle has no regional wall motion  abnormalities. There is mild asymmetric  left ventricular hypertrophy of the basal-septal segment. Left ventricular  diastolic parameters are consistent with Grade  I diastolic dysfunction  (impaired relaxation). The average left ventricular global longitudinal  strain is -20.1 %. The global  longitudinal strain is normal.   2. Right ventricular systolic function is mildly reduced. The right  ventricular size is mildly enlarged.  There is normal pulmonary artery  systolic pressure. The estimated right ventricular systolic pressure is  29.5 mmHg.   3. Mild-to-moderate leaflet calcification of the mitral valve leaflet(s).   4. The mitral valve is normal in structure. Trivial mitral valve  regurgitation. No evidence of mitral stenosis.   5. The aortic valve has been repaired/replaced. There is a 23 mm bovine  valve present in the aortic position. Procedure Date: 01/04/2016. Echo  findings are consistent with normal structure and function of the aortic  valve prosthesis. Aortic valve  regurgitation is not visualized. No aortic valve stenosis. Mean gradient  28mHg; DI 0.64   6. Aortic root/ascending aorta has been repaired/replaced s/p bentall  procedure in 12/2015. Ascending aorta is normal in size measuring 3.1cm.  There is mild dilatation of the aortic root, measuring 38 mm.   7. The inferior vena cava is normal in size with greater than 50%  respiratory variability, suggesting right atrial pressure of 3 mmHg.   Comparison(s): 09/24/17 EF 60-65%. PA pressure 320mg. AV 79m79m mean PG,  45m60mpeak PG.     EKG: EKG is personally reviewed. 01/04/2022: Sinus rhythm. RBBB, PAC. 10/06/2020-sinus rhythm right bundle branch block  Recent Labs: 03/07/2021: ALT 14 06/26/2021: BUN 14; Creatinine, Ser 0.99; Hemoglobin 15.3; Platelets 247; Potassium 4.9; Sodium 131   Recent Lipid Panel    Component Value Date/Time   CHOL 200 (H) 11/06/2019 0954   TRIG 177 (H) 11/06/2019 0954   HDL 47 11/06/2019 0954   CHOLHDL 4.3 11/06/2019 0954   LDLCALC 122 (H) 11/06/2019 0954     Risk Assessment/Calculations:      Physical Exam:    VS:  BP 126/80 (BP Location: Left Arm, Patient Position: Sitting, Cuff Size: Normal)   Pulse 74   Ht '5\' 10"'$  (1.778 m)   Wt 204 lb (92.5 kg)   SpO2 95%   BMI 29.27 kg/m     Wt Readings from Last 3 Encounters:  01/04/22 204 lb (92.5 kg)  12/25/21 206 lb (93.4 kg)  12/05/21 199 lb (90.3 kg)      GEN:  Well nourished, well developed in no acute distress HEENT: Normal NECK: No JVD; No carotid bruits LYMPHATICS: No lymphadenopathy CARDIAC: RRR, no murmurs, rubs, gallops RESPIRATORY:  Clear to auscultation without rales, wheezing or rhonchi  ABDOMEN: Soft, non-tender, non-distended MUSCULOSKELETAL:  No edema; No deformity  SKIN: Warm and dry NEUROLOGIC:  Alert and oriented x 3 PSYCHIATRIC:  Normal affect   ASSESSMENT:    1. Statin intolerance   2. Aneurysm of ascending aorta without rupture (HCC)[place]3. Coronary artery disease involving native coronary artery of native heart without angina pectoris   4. Hx of aortic aneurysm repair   5. Medication management   6. Mixed hyperlipidemia   7. Preop cardiovascular exam     PLAN:    In order of problems listed above:  [facility], Dr. VantDarcey Noraitoring closely.  Recent CT reviewed as above.  Dr. VantThayer Ohme reviewed as well.  Continuing to monitor.  Coronary artery disease involving native coronary artery of native heart without angina pectoris Bypass.  Previously in 2018 sterile abscess was removed.  Currently having no anginal symptoms.  Aggressive risk  factor prevention.  Hx of aortic aneurysm repair Bentall procedure.  As described above.  Mixed hyperlipidemia Currently on pravastatin 40.  Maximally tolerated statin.  He has had trouble with others in the past he has had trouble with Zetia.  PCSK9 inhibitor I would like for him to be on.  We will refer to lipid clinic.  Repatha described to him.  Goal less than 70.  I would like for him to approach 55 given his prior CABG.  Preop cardiovascular exam He may proceed with knee surgery with low to moderate overall cardiac risk.  He has prior bypass prior aortic repair, valve.  If possible maintain aspirin throughout surgery.  If this is felt to be too high risk for bleeding stopped for limited time.  1 Year  follow-up  Medication Adjustments/Labs and Tests Ordered: Current medicines are reviewed at length with the patient today.  Concerns regarding medicines are outlined above.   Orders Placed This Encounter  Procedures   AMB Referral to [facility]   EKG 12-Lead   No orders of the defined types were placed in this encounter.   Patient Instructions  Medication Instructions:  The current medical regimen is effective;  continue present plan and medications.  *If you need a refill on your cardiac medications before your next appointment, please call your pharmacy*  You have been referred to our [facility] here at [place]: At [facility], you and your health needs are our priority.  As part of our continuing mission to provide you with exceptional heart care, we have created designated Provider Care Teams.  These Care Teams include your primary Cardiologist (physician) and Advanced Practice Providers (APPs -  Physician Assistants and Nurse Practitioners) who all work together to provide you with the care you need, when you need it.  We recommend signing up for the patient portal called "MyChart".  Sign up information is provided on this After Visit Summary.  MyChart is used to connect with patients for Virtual Visits (Telemedicine).  Patients are able to view lab/test results, encounter notes, upcoming appointments, etc.  Non-urgent messages can be sent to your provider as well.   To learn more about what you can do with MyChart, go to [email or web address].    Your next appointment:   1 year(s)  The format for your next appointment:   In Person  Provider:   Candee Furbish, MD {   Important Information About Sugar         I,Mathew Stumpf,acting as a scribe for Candee Furbish, MD.,have documented all relevant documentation on the behalf of Candee Furbish, MD,as directed by  Candee Furbish, MD while in the presence of Candee Furbish, MD.  I, Candee Furbish, MD, have  reviewed all documentation for this visit. The documentation on 01/04/22 for the exam, diagnosis, procedures, and orders are all accurate and complete.   Signed, Candee Furbish, MD  01/04/2022 8:40 AM    [place]

## 2022-01-04 NOTE — Assessment & Plan Note (Signed)
Currently on pravastatin 40.  Maximally tolerated statin.  He has had trouble with others in the past he has had trouble with Zetia.  PCSK9 inhibitor I would like for him to be on.  We will refer to lipid clinic.  Repatha described to him.  Goal less than 70.  I would like for him to approach 55 given his prior CABG.

## 2022-01-05 ENCOUNTER — Ambulatory Visit: Payer: Medicare HMO | Admitting: Physical Therapy

## 2022-01-05 DIAGNOSIS — M5442 Lumbago with sciatica, left side: Secondary | ICD-10-CM | POA: Diagnosis not present

## 2022-01-05 DIAGNOSIS — R252 Cramp and spasm: Secondary | ICD-10-CM

## 2022-01-05 DIAGNOSIS — M6281 Muscle weakness (generalized): Secondary | ICD-10-CM

## 2022-01-05 DIAGNOSIS — M62838 Other muscle spasm: Secondary | ICD-10-CM

## 2022-01-05 NOTE — Therapy (Signed)
OUTPATIENT PHYSICAL THERAPY TREATMENT NOTE   Patient Name: Erik Salazar MRN: [id number] DOB:July 02, 1951, 71 y.o., male 52 Date: 01/05/2022   PCP: Marrian Salvage, [place]  REFERRING PROVIDER: Kary Kos MD   PT End of Session - 01/05/22 0848     Visit Number 3    Number of Visits 12    Date for PT Re-Evaluation 02/02/22    Authorization Type [employer]    Progress Note Due on Visit 10    PT Start Time 0848    PT Stop Time 0932    PT Time Calculation (min) 44 min    Activity Tolerance Patient tolerated treatment well    Behavior During Therapy [facility] for tasks assessed/performed              Past Medical History:  Diagnosis Date   Arthritis    Carpal tunnel syndrome on right 07/08/2017   Cervical radiculopathy at C6 40/98/1191   Right   Complication of anesthesia    Coronary artery disease    Enlarged prostate    H/O: knee surgery    15    History of blood clots    History of open heart surgery    ASCENDING AORTIC ANEURYSM   Ischemic stroke of frontal lobe ([place]) 03/14/2017   Bilateral; post-redo CT surgery   Pneumonia    PONV (postoperative nausea and vomiting)    only after CABG surgeries   Seizure disorder ([place]) 03/14/2017   Seizures ([place])    Status post knee surgery    DVT POST KNEE SURGERY   Stroke [facility])    Past Surgical History:  Procedure Laterality Date   ANTERIOR CERVICAL DECOMP/DISCECTOMY FUSION N/A 05/04/2020   Procedure: Anterior Cervical Decompression/Discectomy Fuion Cervical three-four, Cervical four-five, Cervical five-six;  Surgeon: Kary Kos, MD;  Location: [place];  Service: Neurosurgery;  Laterality: N/A;   BALLOON DILATION N/A 06/23/2019   Procedure: BALLOON DILATION;  Surgeon: Otis Brace, MD;  Location: [facility];  Service: Gastroenterology;  Laterality: N/A;   BENTALL PROCEDURE  01/04/2016   Bentall with 23 mm pericardial AVR; SVG-LAD, SVG-CX ([place])   BICEPS TENDON REPAIR Right    BIOPSY   06/23/2019   Procedure: BIOPSY;  Surgeon: Otis Brace, MD;  Location: [facility];  Service: Gastroenterology;;   CARDIAC SURGERY     ANUERSYM MAY 2017   Bentall procedure. Bioprosthetic aortic valve #23 mm bovine model #2700 TF ask, and 28 mm Gelweave woven vascular sinus of Valsalva graft   CARPAL TUNNEL RELEASE  08/2018   right hand    CORONARY ARTERY BYPASS GRAFT  01/04/2016   VG to LAD & VG to LCX   CORONARY ARTERY BYPASS GRAFT  03/13/2017   LIMA to LAD with steril abcess removal from dacron graft   ESOPHAGOGASTRODUODENOSCOPY     Had done dilatation done about 2 or 3 times before in [place]   ESOPHAGOGASTRODUODENOSCOPY (EGD) WITH PROPOFOL N/A 06/23/2019   Procedure: ESOPHAGOGASTRODUODENOSCOPY (EGD) WITH PROPOFOL;  Surgeon: Otis Brace, MD;  [facility];  Service: Gastroenterology;  Laterality: N/A;   FALSE ANEURYSM REPAIR  03/13/2017   redo sternotomy, sterile abscess removal from Dacron graft, omental flap around aorta, CABG: LIMA-LAD ([name], Dr. Mart Piggs)   [place]  2018   Of pericardium 2018   INSERTION OF MESH  10/14/2020   Procedure: INSERTION OF MESH;  Surgeon: Michael Boston, MD;  Location: [place];  Service: General;;   knee surgeries     13 surgeries  on knee done before knee replacement    [place] N/A 05/19/2021   Procedure: LYSIS OF ADHESIONS;  Surgeon: Michael Boston, MD;  Location: [place];  Service: General;  Laterality: N/A;  GEN & LOCAL   LYSIS OF ADHESION N/A 10/14/2020   Procedure: LYSIS OF ADHESION;  Surgeon: Michael Boston, MD;  Location: [place];  Service: General;  Laterality: N/A;   open heart surgery     03-13-2017   REPLACEMENT TOTAL KNEE  2015   ROTATOR CUFF REPAIR     TONSILLECTOMY     removed as a child.   VENTRAL HERNIA REPAIR N/A 10/14/2020   Procedure: LAPAROSCOPIC VENTRAL HERNIA REPAIR WITH TAP BLOCK BILATERAL;  Surgeon: Michael Boston, MD;  Location: [place];  Service:  General;  Laterality: N/A;   VENTRAL HERNIA REPAIR N/A 05/19/2021   Procedure: LAPAROSCOPIC VENTRAL WALL HERNIA REPAIR;  Surgeon: Michael Boston, MD;  Location: [place];  Service: General;  Laterality: N/A;   Patient Active Problem List   Diagnosis Date Noted   Coronary artery disease involving native coronary artery of native heart without angina pectoris 01/04/2022   Preop cardiovascular exam 01/04/2022   Primary osteoarthritis of right knee 01/04/2022   Acute right-sided low back pain without sciatica 12/25/2021   Aortic atherosclerosis ([place]) 09/04/2021   Spondylolisthesis at L4-L5 level 06/28/2021   S/P repair of ventral hernia 05/19/2021   S/P repair of recurrent ventral hernia 05/19/2021   Benign prostatic hyperplasia without lower urinary tract symptoms 10/14/2020   Chronic pain 10/14/2020   ED (erectile dysfunction) of organic origin 10/14/2020   Esophageal dysphagia 10/14/2020   Gastroesophageal reflux disease 10/14/2020   History of aortic valve replacement with bioprosthetic valve 10/14/2020   Hx of aortic aneurysm repair 10/14/2020   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   Thoracic aortic aneurysm without rupture ([place]) 10/14/2020   Recurrent incisional hernia 10/14/2020   Spinal stenosis in cervical region 05/04/2020   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   Cervical radiculopathy at C6 11/14/2017   Carpal tunnel syndrome on right 07/08/2017   Tremor, essential 07/08/2017   Ischemic stroke of frontal lobe ([place]) 04/05/2017   Pain in right hand 04/05/2017   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 03/27/2017   Seizure ([place]) 03/14/2017   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/13/2017   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   Essential hypertension 02/26/2017   Mixed hyperlipidemia 02/26/2017    Rationale for Evaluation and Treatment Rehabilitation  REFERRING DIAG: M43.16 (ICD-10-CM) - Spondylolisthesis, lumbar region  THERAPY DIAG:  Acute left-sided low back pain  with left-sided sciatica  Muscle weakness (generalized)  Cramp and spasm  Other muscle spasm  PERTINENT HISTORY: s/p lumbar fusion L5/S1 06/28/2021; chronic pain,  stroke, HTN, dysphagia, gerd, tremor, seizure, cervical stenosis,  surgery - aortic aneurism repair, hernia repair, aortic valve repair, omental flap graft to mediastinum   PRECAUTIONS: fall (knee buckling)  SUBJECTIVE: Pt reports he has been scheduled for a R TKA on 01/18/22. Requesting DN today for his back noting relief for 3-4 days following last session.  PAIN:  Are you having pain? Yes: NPRS scale: 7/10 Pain location: B low back and glutes   OBJECTIVE: (objective measures completed at initial evaluation unless otherwise dated)   FOTO 58%( 49% adjusted), predicted outcome 60% after 12 visits.       Sensation  Light Touch Appears Intact      Posture/Postural Control  Posture/Postural Control Postural limitations  Postural Limitations Decreased lumbar lordosis      ROM / Strength  AROM / PROM / Strength AROM;PROM;Strength      AROM  Overall AROM  Deficits;Due to pain   AROM Assessment Site Lumbar;Knee   Right/Left Knee Left   Left Knee Extension -25   Lumbar Flexion limited 50%, increased pain, noted knee flexion with flexion   Lumbar Extension limited 100%   Lumbar - Right Side Bend limited 75%   Lumbar - Left Side Bend limited 50%   Lumbar - Right Rotation slighlty more than L rotation, but increased LBP on L side   Lumbar - Left Rotation immediate increase in sharp R sided LBP, limited 75%      PROM  Overall PROM  Deficits;Due to pain   Overall PROM Comments very limited hip mobility, all directions, due ot pain.      Strength  Overall Strength Within functional limits for tasks performed   Overall Strength Comments decreased core strength, history abdominal surgeries, hernia repairs.  Bil LE 5/5 all myotomes.      Flexibility  Soft Tissue Assessment /Muscle Length yes   Hamstrings tightness bil  HS   Piriformis tightness bil   Quadratus Lumborum tightness bil      Palpation  Spinal mobility hypomobility in lumbar spine (history lumbar fusion L5-S1), tenderness throughout lumbar   Palpation comment tenderness bil QL, lumbar paraspinals, glutes and piriformis, Left worse than Right      Special Tests  Other special tests SLR 45 deg bil with increased pain     TODAY'S TREATMENT:  01/05/22 THERAPEUTIC EXERCISE: to improve strength and mobility.  Verbal and tactile cues throughout for technique. NuStep L5 x 6 min for warm-up and subjective  MANUAL THERAPY: STM to B erector spinale, QL and upper glutes.  Skilled palpation and monitoring with dry needling.   DRY NEEDLING: Dry needling consent given? yes Educational handouts provided? Previously provided Muscles treated: B QL, B glut med Response from dry needling: twitch response, palpable decrease is spasm, tolerated well.    SELF CARE: Instruction in self STM to lumbar paraspinals using foam roller on wall and to glutes using tennis ball on wall.  12/26/21 THERAPEUTIC EXERCISE: to improve strength and mobility.  Verbal and tactile cues throughout for technique. Bike x 5 min level 1 for warm-up and subjective Gentle flexion stretch after DN - Seated Quad Set 2 sets - 10 reps - 5 sec  hold - Seated Hamstring Stretch  -  3 reps - 30 sec  hold  SELF CARE: education on set up and care of TENS unit, where to place pads (placed vertically on each side over erector spinae), trialed Normal setting today, tolerated well.    MANUAL THERAPY:  STM to R erector spinale and QL, skilled palpation and monitoring with dry needling.   DRY NEEDLING: Dry needling consent given? yes Educational handouts provided? yes Muscles treated: R QL, R glut med Response from dry needling: twitch response, palpable decrease is spasm, tolerated well.    PATIENT EDUCATION: Education details:  self-STM options using foam roller and/or tennis ball Person  educated: Patient Education method: Consulting civil engineer, Demonstration, Verbal cues, and Handouts Education comprehension: verbalized understanding and returned demonstration   HOME EXERCISE PROGRAM: [id number]  ASSESSMENT: Clinical Impression: Alberto returns today reporting 3-4 days on improvement in LBP following DN and stretches completed during last PT visit but pain gradually increasing in recent days. Increased muscle tension and TTP noted in B QL and upper glutes  today - addressed with further MT incorporating DN with good twitch responses elicited resulting in palpable reduction in muscle tension and decreased TTP. Provided instruction in use of foam roller and/or tennis ball on wall to help further reduce muscle tension/tightness and maintain gains achieved with MT and DN. He will benefit from further progression of core and proximal LE flexibility and strengthening to promote improved lumbopelvic stability for decreased pain and improved activity tolerance.     PT Short Term Goals - 01/05/22 0853       PT SHORT TERM GOAL #1   Title Complete DGI to assess for balance deficits    Time 2    Period Weeks    Status Deferred   PT POC for LBP will be cut short due to pt scheduled for TKA on 01/18/22 - will plan for balance assessment as indicated if pt returns to this clinic following the TKA surgery.   Target Date 01/05/22              PT Long Term Goals - 01/05/22 0853       PT LONG TERM GOAL #1   Title Ind with progressed HEP for core strengthening to improve outcomes.    Baseline previous HEP but difficulty performing due to severe LBP    Time 6    Period Weeks    Status On-going    Target Date 02/02/22      PT LONG TERM GOAL #2   Title Pt. will report 75% improvement in sleep disruptions due to LBP    Baseline difficulty sleeping due to LBP    Time 6    Period Weeks    Status On-going    Target Date 02/02/22      PT LONG TERM GOAL #3   Title Pt. will report 50% improve in  LBP to improve QOL.    Baseline currently 9/10 LBP    Time 6    Period Weeks    Status On-going    Target Date 02/02/22      PT LONG TERM GOAL #4   Title Score at least 19/24 on DGI for safety with community ambulation.    Time 6    Period Weeks    Status Deferred    Target Date 02/02/22      PT LONG TERM GOAL #5   Title Be able to return to gym based exercise program without limitation from LBP.    Time 6    Period Weeks    Status On-going    Target Date 02/02/22            PLAN:   PT Frequency 2x / week   PT Duration 6 weeks   PT Treatment/Interventions ADLs/Self Care Home Management;Cryotherapy;Electrical Stimulation;Moist Heat;Functional mobility training;Therapeutic activities;Therapeutic exercise;Balance training;Neuromuscular re-education;Patient/family education;Manual techniques;Passive range of motion;Dry needling;Taping;Spinal Manipulations;Joint Manipulations   PT Next Visit Plan DN to glutes/piriformis/QL (caution history of lumbar laminectomy).  Core strengthening to tolerance.       Percival Spanish, PT 01/05/2022, 1:05 PM

## 2022-01-10 ENCOUNTER — Telehealth: Payer: Self-pay | Admitting: Orthopaedic Surgery

## 2022-01-10 ENCOUNTER — Ambulatory Visit: Payer: Medicare HMO | Admitting: Physical Therapy

## 2022-01-10 ENCOUNTER — Other Ambulatory Visit (HOSPITAL_BASED_OUTPATIENT_CLINIC_OR_DEPARTMENT_OTHER): Payer: Self-pay

## 2022-01-10 NOTE — Telephone Encounter (Signed)
He will start outpatient PT 2 weeks postop.  Initial 2 weeks will be HHPT.  Thanks.

## 2022-01-10 NOTE — Pre-Procedure Instructions (Addendum)
Surgical Instructions    Your procedure is scheduled on Thursday, June 1.  Report to [facility] Main Entrance "A" at 7:45 A.M., then check in with the Admitting office.  Call this number if you have problems the morning of surgery:  [phone]   If you have any questions prior to your surgery date call [phone]: Open Monday-Friday 8am-4pm    Remember:  Do not eat after midnight the night before your surgery  You may drink clear liquids until 7:15AM the morning of your surgery.   Clear liquids allowed are: Water, Non-Citrus Juices (without pulp), Carbonated Beverages, Clear Tea, Black Coffee ONLY (NO MILK, CREAM OR POWDERED CREAMER of any kind), and Gatorade  Patient Instructions  The night before surgery:  No food after midnight. ONLY clear liquids after midnight  The day of surgery (if you do NOT have diabetes):  Drink ONE (1) Pre-Surgery Clear Ensure by 7:15 AM the morning of surgery. Drink in one sitting. Do not sip.  This drink was given to you during your hospital  pre-op appointment visit.  Nothing else to drink after completing the  Pre-Surgery Clear Ensure.          If you have questions, please contact your surgeon's office.     Take these medicines the morning of surgery with A SIP OF WATER:   levETIRAcetam (KEPPRA) metoprolol succinate (TOPROL-XL) tamsulosin (FLOMAX)   Follow your surgeon's instructions on when to stop Aspirin.  If no instructions were given by your surgeon then you will need to call the office to get those instructions.    As of today, STOP taking any Aleve, Naproxen, Ibuprofen, Motrin, Advil, Goody's, BC's, all herbal medications, fish oil, and all vitamins.           Do not wear jewelry or makeup Do not wear lotions, powders, perfumes/colognes, or deodorant. Do not shave 48 hours prior to surgery.  Men may shave face and neck. Do not bring valuables to the hospital. Do not wear nail polish, gel polish, artificial nails, or any  other type of covering on natural nails (fingers and toes) If you have artificial nails or gel coating that need to be removed by a nail salon, please have this removed prior to surgery. Artificial nails or gel coating may interfere with anesthesia's ability to adequately monitor your vital signs.  [facility] is not responsible for any belongings or valuables. .   Do NOT Smoke (Tobacco/Vaping)  24 hours prior to your procedure  If you use a CPAP at night, you may bring your mask for your overnight stay.   Contacts, glasses, hearing aids, dentures or partials may not be worn into surgery, please bring cases for these belongings   For patients admitted to the hospital, discharge time will be determined by your treatment team.   Patients discharged the day of surgery will not be allowed to drive home, and someone needs to stay with them for 24 hours.   SURGICAL WAITING ROOM VISITATION Patients having surgery or a procedure in a hospital may have two support people. Children under the age of 27 must have an adult with them who is not the patient. They may stay in the waiting area during the procedure and may switch out with other visitors. If the patient needs to stay at the hospital during part of their recovery, the visitor guidelines for inpatient rooms apply.  Please refer to the [facility] website for the visitor guidelines for Inpatients (after your surgery is over  and you are in a regular room).       Special instructions:    Oral Hygiene is also important to reduce your risk of infection.  Remember - BRUSH YOUR TEETH THE MORNING OF SURGERY WITH YOUR REGULAR TOOTHPASTE   [place]- Preparing For Surgery  Before surgery, you can play an important role. Because skin is not sterile, your skin needs to be as free of germs as possible. You can reduce the number of germs on your skin by washing with Dial Antibacterial Soap before surgery.     Please do not use if you have an  allergy to antibacterial soaps. If your skin becomes reddened/irritated stop using the Antibacterial Soap  Do not shave (including legs and underarms) for at least 48 hours prior to first Antibacterial Soap shower. It is OK to shave your face.  Please follow these instructions carefully.     Shower the NIGHT BEFORE SURGERY and the MORNING OF SURGERY with Dial Antibacterial Soap    If you chose to wash your hair, wash your hair first as usual with your normal shampoo. After you shampoo, rinse your hair and body thoroughly to remove the shampoo.  Then [employer] and genitals (private parts) with your normal soap and rinse thoroughly to remove soap.  After that Use Dial Antibacterial Soap  as you would any other liquid soap. You can apply Dial Antibacterial Soap directly to the skin and wash gently with a scrungie or a clean washcloth.   Apply the Dial Antibacterial Soap  to your body ONLY FROM THE NECK DOWN.  Do not use on open wounds or open sores. Avoid contact with your eyes, ears, mouth and genitals (private parts). Wash Face and genitals (private parts)  with your normal soap.   Wash thoroughly, paying special attention to the area where your surgery will be performed.  Thoroughly rinse your body with warm water from the neck down.  DO NOT shower/wash with your normal soap after using and rinsing off the Dial Antibacterial Soap .  Pat yourself dry with a CLEAN TOWEL.  Wear CLEAN PAJAMAS to bed the night before surgery  Place CLEAN SHEETS on your bed the night before your surgery  DO NOT SLEEP WITH PETS.   Day of Surgery:  Take a shower with Dial Antibacterial Soap . Wear Clean/Comfortable clothing the morning of surgery Do not apply any deodorants/lotions.   Remember to brush your teeth WITH YOUR REGULAR TOOTHPASTE.    If you received a COVID test during your pre-op visit, it is requested that you wear a mask when out in public, stay away from anyone that may not be feeling  well, and notify your surgeon if you develop symptoms. If you have been in contact with anyone that has tested positive in the last 10 days, please notify your surgeon.    Please read over the following fact sheets that you were given.

## 2022-01-10 NOTE — Telephone Encounter (Signed)
Patient left a message requesting a call back to let him know when he need to start PT post surgery. Patient states PT on [place] needs to know so they can schedule his appointments. Please advise.

## 2022-01-11 ENCOUNTER — Encounter (HOSPITAL_COMMUNITY): Payer: Self-pay

## 2022-01-11 ENCOUNTER — Encounter (HOSPITAL_COMMUNITY)
Admission: RE | Admit: 2022-01-11 | Discharge: 2022-01-11 | Disposition: A | Discharge status: Home / Self Care | Payer: Medicare HMO | Source: Ambulatory Visit | Attending: Orthopaedic Surgery | Admitting: Orthopaedic Surgery

## 2022-01-11 ENCOUNTER — Other Ambulatory Visit: Payer: Self-pay

## 2022-01-11 VITALS — BP 136/77 | HR 76 | Temp 97.8°F | Resp 17 | Ht 70.0 in | Wt 204.7 lb

## 2022-01-11 DIAGNOSIS — M1711 Unilateral primary osteoarthritis, right knee: Secondary | ICD-10-CM | POA: Diagnosis not present

## 2022-01-11 DIAGNOSIS — R569 Unspecified convulsions: Secondary | ICD-10-CM | POA: Insufficient documentation

## 2022-01-11 DIAGNOSIS — Z79899 Other long term (current) drug therapy: Secondary | ICD-10-CM | POA: Diagnosis not present

## 2022-01-11 DIAGNOSIS — Z951 Presence of aortocoronary bypass graft: Secondary | ICD-10-CM | POA: Insufficient documentation

## 2022-01-11 DIAGNOSIS — Z01812 Encounter for preprocedural laboratory examination: Secondary | ICD-10-CM | POA: Diagnosis present

## 2022-01-11 DIAGNOSIS — Z8673 Personal history of transient ischemic attack (TIA), and cerebral infarction without residual deficits: Secondary | ICD-10-CM | POA: Insufficient documentation

## 2022-01-11 DIAGNOSIS — Z01818 Encounter for other preprocedural examination: Secondary | ICD-10-CM

## 2022-01-11 DIAGNOSIS — N281 Cyst of kidney, acquired: Secondary | ICD-10-CM | POA: Insufficient documentation

## 2022-01-11 DIAGNOSIS — I2510 Atherosclerotic heart disease of native coronary artery without angina pectoris: Secondary | ICD-10-CM | POA: Insufficient documentation

## 2022-01-11 HISTORY — DX: Fibromyalgia: M79.7

## 2022-01-11 LAB — COMPREHENSIVE METABOLIC PANEL
ALT: 23 U/L (ref 0–44)
AST: 24 U/L (ref 15–41)
Albumin: 4 g/dL (ref 3.5–5.0)
Alkaline Phosphatase: 80 U/L (ref 38–126)
Anion gap: 9 (ref 5–15)
BUN: 11 mg/dL (ref 8–23)
CO2: 26 mmol/L (ref 22–32)
Calcium: 9.5 mg/dL (ref 8.9–10.3)
Chloride: 100 mmol/L (ref 98–111)
Creatinine, Ser: 1.03 mg/dL (ref 0.61–1.24)
GFR, Estimated: >60 mL/min (ref >60)
Glucose, Bld: 101 mg/dL — ABNORMAL HIGH (ref 70–99)
Potassium: 4.5 mmol/L (ref 3.5–5.1)
Sodium: 135 mmol/L (ref 135–145)
Total Bilirubin: 0.8 mg/dL (ref 0.3–1.2)
Total Protein: 7.1 g/dL (ref 6.5–8.1)

## 2022-01-11 LAB — CBC
HCT: 46.3 % (ref 39.0–52.0)
Hemoglobin: 14.6 g/dL (ref 13.0–17.0)
MCH: 27.2 pg (ref 26.0–34.0)
MCHC: 31.5 g/dL (ref 30.0–36.0)
MCV: 86.2 fL (ref 80.0–100.0)
Platelets: 282 10*3/uL (ref 150–400)
RBC: 5.37 MIL/uL (ref 4.22–5.81)
RDW: 14.6 % (ref 11.5–15.5)
WBC: 9.2 10*3/uL (ref 4.0–10.5)
nRBC: 0 % (ref 0.0–0.2)

## 2022-01-11 LAB — SURGICAL PCR SCREEN
MRSA, PCR: NEGATIVE
Staphylococcus aureus: NEGATIVE

## 2022-01-11 NOTE — Telephone Encounter (Signed)
Sent mychart msg.

## 2022-01-11 NOTE — Progress Notes (Signed)
PCP - Marrian Salvage, NP Cardiologist - Dr. Candee Furbish  PPM/ICD - denies   Chest x-ray - 03/05/19 (DG Ribs and chest 3 view) EKG - 01/04/22 Stress Test - 2017 per pt, no abnormalities ECHO - 12/08/20 Cardiac Cath - 01/02/16  Sleep Study - denies   DM- denies  Blood Thinner Instructions: n/a Aspirin Instructions: Take ASA thru [place] - yes PRE-SURGERY Ensure given at PAT  COVID TEST-  n/a   Anesthesia review: yes, cardiac hx  Patient denies shortness of breath, fever, cough and chest pain at PAT appointment   All instructions explained to the patient, with a verbal understanding of the material. Patient agrees to go over the instructions while at home for a better understanding. Patient also instructed to notify surgeon of any contact with COVID + person or if he develops any symptoms. The opportunity to ask questions was provided.

## 2022-01-12 ENCOUNTER — Encounter: Payer: Medicare HMO | Admitting: Physical Therapy

## 2022-01-12 ENCOUNTER — Other Ambulatory Visit: Payer: Self-pay | Admitting: Physician Assistant

## 2022-01-12 ENCOUNTER — Other Ambulatory Visit (HOSPITAL_BASED_OUTPATIENT_CLINIC_OR_DEPARTMENT_OTHER): Payer: Self-pay

## 2022-01-12 MED ORDER — HYDROCODONE-ACETAMINOPHEN 7.5-325 MG PO TABS
1.0000 | ORAL_TABLET | Freq: Four times a day (QID) | ORAL | 0 refills | Status: DC | PRN
  Filled 2022-01-12: qty 40, 5d supply, fill #0

## 2022-01-12 MED ORDER — ONDANSETRON HCL 4 MG PO TABS
4.0000 mg | ORAL_TABLET | Freq: Three times a day (TID) | ORAL | 0 refills | Status: DC | PRN
  Filled 2022-01-12: qty 40, 14d supply, fill #0

## 2022-01-12 MED ORDER — METHOCARBAMOL 750 MG PO TABS
750.0000 mg | ORAL_TABLET | Freq: Two times a day (BID) | ORAL | 2 refills | Status: DC | PRN
  Filled 2022-01-12: qty 20, 10d supply, fill #0

## 2022-01-12 MED ORDER — ASPIRIN 81 MG PO TBEC
81.0000 mg | DELAYED_RELEASE_TABLET | Freq: Two times a day (BID) | ORAL | 0 refills | Status: DC
  Filled 2022-01-12: qty 120, 60d supply, fill #0

## 2022-01-12 MED ORDER — DOCUSATE SODIUM 100 MG PO CAPS
100.0000 mg | ORAL_CAPSULE | Freq: Every day | ORAL | 2 refills | Status: DC | PRN
  Filled 2022-01-12: qty 100, 100d supply, fill #0

## 2022-01-12 NOTE — Anesthesia Preprocedure Evaluation (Addendum)
Anesthesia Evaluation  Patient identified by MRN, date of birth, ID band Patient awake    Reviewed: Allergy & Precautions, NPO status , Patient's Chart, lab work & pertinent test results  History of Anesthesia Complications (+) DIFFICULT AIRWAY and history of anesthetic complications  Airway Mallampati: II   Neck ROM: Limited  Mouth opening: Limited Mouth Opening  Dental no notable dental hx.    Pulmonary neg pulmonary ROS, former smoker,    Pulmonary exam normal breath sounds clear to auscultation       Cardiovascular hypertension, Pt. on medications and Pt. on home beta blockers + CAD  Normal cardiovascular exam Rhythm:Regular Rate:Normal     Neuro/Psych Seizures -, Well Controlled,   Neuromuscular disease (cervical radiculopathy) CVA negative psych ROS   GI/Hepatic Neg liver ROS, GERD  Medicated,  Endo/Other  negative endocrine ROS  Renal/GU negative Renal ROS  negative genitourinary   Musculoskeletal  (+) Arthritis , Fibromyalgia -  Abdominal   Peds negative pediatric ROS (+)  Hematology negative hematology ROS (+)   Anesthesia Other Findings   Reproductive/Obstetrics negative OB ROS                           Anesthesia Physical Anesthesia Plan  ASA: 3  Anesthesia Plan: Regional, MAC and Spinal   Post-op Pain Management: Tylenol PO (pre-op)*   Induction: Intravenous  PONV Risk Score and Plan: 1 and Propofol infusion, TIVA, Treatment may vary due to age or medical condition, Ondansetron, Dexamethasone and Midazolam  Airway Management Planned: Natural Airway and Simple Face Mask  Additional Equipment: None  Intra-op Plan:   Post-operative Plan:   Informed Consent: I have reviewed the patients History and Physical, chart, labs and discussed the procedure including the risks, benefits and alternatives for the proposed anesthesia with the patient or authorized representative  who has indicated his/her understanding and acceptance.     Dental advisory given  Plan Discussed with: CRNA, Anesthesiologist and Surgeon  Anesthesia Plan Comments: (Recent lower back surgery with rods placed 07/2021. Will attempt spinal. GA/LMA as backup plan. 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 complicated bypost-operative bilateral frontal lobe infarcts with seizures (03/14/17; recurrent seizure 02/2019 while tapering Keppra).  Last seen by cardiologist Dr. Anne Fu 01/04/2022.  Per note, patient continues to exercise routinely on the treadmill with heart rate up to the 130s.  Upcoming surgery was also addressed, "Preop cardiovascular exam: He may proceed with knee surgery with low to moderate overall cardiac risk. He has prior bypass prior aortic repair, valve. If possible maintain aspirin throughout surgery. If this is felt to be too high risk for bleeding stopped for limited time."  Hx of CVA seizures, followed by neurologist Dr. Terrace Arabia.Last seen 07/31/2021.  At that time it was noted patient reported recurrent staring spells while on Keppra 750 mg twice daily.  Keppra was increased with as milligrams twice daily and EEG ordered.  EEG 08/09/2021 was normal, no electrodiagnostic evidence of epileptiform discharge.  Recommended continue Keppra 1000 mg twice daily.  Difficult Airway- due to limited oral opening and reduced neck mobility.  Glide scope has been used previously.  Preop labs  reviewed, WNL.  EKG 01/04/2022: Sinus rhythm with premature atrial complexes.  Rate 74.  Right bundle branch block.  TTE4/21/22:  FINDINGS  Left Ventricle: Left  ventricular ejection fraction, by estimation, is 55  to 60%. Left ventricular ejection fraction by 3D volume is 60 %. The left  ventricle has normal function. The left ventricle has no regional wall  motion abnormalities. The average  left ventricular global longitudinal strain is -20.1 %. The global  longitudinal strain is normal. The left ventricular internal cavity size  was normal in size. There is mild asymmetric left ventricular hypertrophy  of the basal-septal segment. Abnormal  (paradoxical) septal motion consistent with post-operative status. Left  ventricular diastolic parameters are consistent with Grade I diastolic  dysfunction (impaired relaxation).  MRI brain 03/10/21: IMPRESSION: 1. No acute intracranial abnormality. 2. Remote frontal lobe infarcts bilaterally. 3. Periventricular and subcortical T2 hyperintensities bilaterally are mildly advanced for age. The finding is nonspecific but can be seen in the setting of chronic microvascular ischemia, a demyelinating process such as multiple sclerosis, vasculitis, complicated migraine headaches, or as the sequelae of a prior infectious or inflammatory process.  CTA chest 03/09/20: IMPRESSION: 1. Stable postsurgical changes in the chest. The aortic graft is patent without complicating features. Negative for aortic dissection. 2. No acute abnormality in the chest. 3. Hyperdense cyst in left kidney upper pole. This is similar to the prior abdominal CT and probably represents a hemorrhagic or proteinaceous cyst.  )     Anesthesia Quick Evaluation

## 2022-01-12 NOTE — Progress Notes (Addendum)
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    Last seen by cardiologist Dr. Marlou Porch 01/04/2022.  Per note, patient continues to exercise routinely on the treadmill with heart rate up to the 130s.  Upcoming surgery was also addressed, "Preop cardiovascular exam: He may proceed with knee surgery with low to moderate overall cardiac risk.  He has prior bypass prior aortic repair, valve.  If possible maintain aspirin throughout surgery.  If this is felt to be too high risk for bleeding stopped for limited time."   Hx of CVA seizures, followed by neurologist Dr. Krista Blue.  Last seen 07/31/2021.  At that time it was noted patient reported recurrent staring spells while on Keppra 750 mg twice daily.  Keppra was increased with as milligrams twice daily and EEG ordered.  EEG 08/09/2021 was normal, no electrodiagnostic evidence of epileptiform discharge.  Recommended continue Keppra 1000 mg twice daily.   Difficult Airway- due to limited oral opening and reduced neck mobility.  Glide scope has been used previously.  Preop labs reviewed, WNL.   EKG 01/04/2022: Sinus rhythm with premature atrial complexes.  Rate 74.  Right bundle branch block.   TTE 12/08/20:  FINDINGS   Left Ventricle: Left ventricular ejection fraction, by estimation, is 55  to 60%. Left ventricular ejection fraction by 3D volume is 60 %. The left  ventricle has normal function. The left ventricle has no regional wall  motion abnormalities. The average  left ventricular global longitudinal strain is -20.1 %. The global   longitudinal strain is normal. The left ventricular internal cavity size  was normal in size. There is mild asymmetric left ventricular hypertrophy  of the basal-septal segment. Abnormal  (paradoxical) septal motion consistent with post-operative status. Left  ventricular diastolic parameters are consistent with Grade I diastolic  dysfunction (impaired relaxation).   MRI brain 03/10/21: IMPRESSION: 1. No acute intracranial abnormality. 2. Remote frontal lobe infarcts bilaterally. 3. Periventricular and subcortical T2 hyperintensities bilaterally are mildly advanced for age. The finding is nonspecific but can be seen in the setting of chronic microvascular ischemia, a demyelinating process such as multiple sclerosis, vasculitis, complicated migraine headaches, or as the sequelae of a prior infectious or inflammatory process.   CTA chest 03/09/20: IMPRESSION: 1. Stable postsurgical changes in the chest. The aortic graft is patent without complicating features. Negative for aortic dissection. 2. No acute abnormality in the chest. 3. Hyperdense cyst in left kidney upper pole. This is similar to the prior abdominal CT and probably represents a hemorrhagic or proteinaceous cyst.    Wynonia Musty [facility] Short Stay Center/Anesthesiology Phone [phone] 01/12/2022 1:36 PM

## 2022-01-16 ENCOUNTER — Telehealth: Payer: Self-pay | Admitting: Cardiology

## 2022-01-16 ENCOUNTER — Other Ambulatory Visit (HOSPITAL_BASED_OUTPATIENT_CLINIC_OR_DEPARTMENT_OTHER): Payer: Self-pay

## 2022-01-16 ENCOUNTER — Encounter: Payer: Self-pay | Admitting: Orthopaedic Surgery

## 2022-01-16 ENCOUNTER — Encounter: Payer: Self-pay | Admitting: Cardiology

## 2022-01-16 NOTE — Telephone Encounter (Signed)
Patient states that he was calling in with the plan from his dr for his procedure. I explain to patient office need to call us because sounds like a clearance. But patient assist on having nurse call him

## 2022-01-16 NOTE — Telephone Encounter (Signed)
Per Dr Phoebe Sharps OV note: Visit Diagnoses:  1. Primary osteoarthritis of right knee      Plan: Impression is end-stage right knee tricompartmental DJD.  Based on his option of arthroscopic debridement and total knee replacement I do agree that a total knee replacement would be more reliable for pain relief and would minimize the risk of additional surgery.  Based on his options he is in agreement with moving forward with a total knee replacement.  He has done well from his left knee replacement which was done several years back in [place].  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  We will need to make sure that this is okay with Dr. [name] for him to be on Xarelto.  He will continue to take his baby aspirin.  He does have hallucinations with oxycodone and has a history of seizures therefore I do not think tramadol is a good option for postoperative pain relief.  We may have to manage with mainly Tylenol and could consider hydrocodone.  He saw Dr. Marlou Porch today who provided clearance to have surgery.  Denies nickel allergy.  Pt is wanting to see if it would be safe for him to take Xarelto X 1 mn after surgery - advised I will have Dr Marlou Porch to review and pt will be notified.  Please see Pt Advise request for further documentation.

## 2022-01-16 NOTE — Telephone Encounter (Signed)
Dr. Marlou Porch to review.  Patient was recently cleared for partial right knee meniscus surgery by Dr. Erlinda Hong.  He has a history of CAD s/p CABG and AVR/Bentall.  Patient had original CABG surgery in [place] in 2017.  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  This was over 6 cm in length.  He requested a second opinion at [facility] and is subsequently underwent redo sternotomy in the summer 2018 at [facility].  An 9 centimeter abscess collection along the graft was found, there was no false aneurysm.  The abscess was debrided andfilled with omental flap.  Do think the patient can tolerate Xarelto for 1 month for VTE prophylaxis after orthopedic surgery?

## 2022-01-17 ENCOUNTER — Telehealth: Payer: Self-pay | Admitting: *Deleted

## 2022-01-17 MED ORDER — TRANEXAMIC ACID 1000 MG/10ML IV SOLN
2000.0000 mg | INTRAVENOUS | Status: DC
  Filled 2022-01-17: qty 20

## 2022-01-17 NOTE — Telephone Encounter (Signed)
Contacted patient to advise that per Dr. Prescott Gum, patient may take Xarelto for a month post knee replacement surgery. Patient appreciative of call.

## 2022-01-17 NOTE — Telephone Encounter (Signed)
Per Dr. Marlou Porch, okay to use Xarelto after the surgery.  I have called and informed the patient myself.

## 2022-01-17 NOTE — Progress Notes (Signed)
Patient was notified that he must be at the hospital for surgery tomorrow at 07:00 o'clock. Patient verbalized understanding.

## 2022-01-18 ENCOUNTER — Observation Stay (HOSPITAL_COMMUNITY)
Admission: RE | Admit: 2022-01-18 | Discharge: 2022-01-19 | Disposition: A | Discharge status: Home / Self Care | Payer: Medicare HMO | Source: Ambulatory Visit | Attending: Orthopaedic Surgery | Admitting: Orthopaedic Surgery

## 2022-01-18 ENCOUNTER — Observation Stay (HOSPITAL_COMMUNITY): Payer: Medicare HMO

## 2022-01-18 ENCOUNTER — Ambulatory Visit (HOSPITAL_BASED_OUTPATIENT_CLINIC_OR_DEPARTMENT_OTHER): Payer: Medicare HMO | Admitting: Anesthesiology

## 2022-01-18 ENCOUNTER — Encounter (HOSPITAL_COMMUNITY): Payer: Self-pay | Admitting: Orthopaedic Surgery

## 2022-01-18 ENCOUNTER — Other Ambulatory Visit: Payer: Self-pay

## 2022-01-18 ENCOUNTER — Encounter (HOSPITAL_COMMUNITY)
Admission: RE | Disposition: A | Discharge status: Home / Self Care | Payer: Self-pay | Source: Ambulatory Visit | Attending: Orthopaedic Surgery

## 2022-01-18 ENCOUNTER — Ambulatory Visit (HOSPITAL_COMMUNITY): Payer: Medicare HMO | Admitting: Physician Assistant

## 2022-01-18 DIAGNOSIS — M797 Fibromyalgia: Secondary | ICD-10-CM

## 2022-01-18 DIAGNOSIS — I10 Essential (primary) hypertension: Secondary | ICD-10-CM

## 2022-01-18 DIAGNOSIS — Z8673 Personal history of transient ischemic attack (TIA), and cerebral infarction without residual deficits: Secondary | ICD-10-CM | POA: Diagnosis not present

## 2022-01-18 DIAGNOSIS — Z87891 Personal history of nicotine dependence: Secondary | ICD-10-CM | POA: Insufficient documentation

## 2022-01-18 DIAGNOSIS — M1711 Unilateral primary osteoarthritis, right knee: Secondary | ICD-10-CM | POA: Diagnosis not present

## 2022-01-18 DIAGNOSIS — Z96652 Presence of left artificial knee joint: Secondary | ICD-10-CM | POA: Diagnosis not present

## 2022-01-18 DIAGNOSIS — Z7982 Long term (current) use of aspirin: Secondary | ICD-10-CM | POA: Diagnosis not present

## 2022-01-18 DIAGNOSIS — Z96651 Presence of right artificial knee joint: Secondary | ICD-10-CM

## 2022-01-18 DIAGNOSIS — Z79899 Other long term (current) drug therapy: Secondary | ICD-10-CM | POA: Diagnosis not present

## 2022-01-18 DIAGNOSIS — Z951 Presence of aortocoronary bypass graft: Secondary | ICD-10-CM | POA: Diagnosis not present

## 2022-01-18 DIAGNOSIS — I2510 Atherosclerotic heart disease of native coronary artery without angina pectoris: Secondary | ICD-10-CM | POA: Insufficient documentation

## 2022-01-18 DIAGNOSIS — Z86718 Personal history of other venous thrombosis and embolism: Secondary | ICD-10-CM | POA: Insufficient documentation

## 2022-01-18 HISTORY — PX: TOTAL KNEE ARTHROPLASTY: SHX125

## 2022-01-18 SURGERY — ARTHROPLASTY, KNEE, TOTAL
Anesthesia: Monitor Anesthesia Care | Site: Knee | Laterality: Right

## 2022-01-18 MED ORDER — DEXMEDETOMIDINE (PRECEDEX) IN NS 20 MCG/5ML (4 MCG/ML) IV SYRINGE
PREFILLED_SYRINGE | INTRAVENOUS | Status: DC | PRN
  Administered 2022-01-18: 16 ug via INTRAVENOUS

## 2022-01-18 MED ORDER — MENTHOL 3 MG MT LOZG
1.0000 | LOZENGE | OROMUCOSAL | Status: DC | PRN
Start: 2022-01-18 — End: 2022-01-19

## 2022-01-18 MED ORDER — ORAL CARE MOUTH RINSE: 15.0000 mL | Freq: Once | OROMUCOSAL | Status: AC

## 2022-01-18 MED ORDER — CHLORHEXIDINE GLUCONATE 0.12 % MT SOLN
15.0000 mL | Freq: Once | OROMUCOSAL | Status: AC
  Administered 2022-01-18: 15 mL via OROMUCOSAL
  Filled 2022-01-18: qty 15

## 2022-01-18 MED ORDER — MIDAZOLAM HCL 2 MG/2ML IJ SOLN
INTRAMUSCULAR | Status: AC
  Administered 2022-01-18: 1 mg via INTRAVENOUS
  Filled 2022-01-18: qty 2

## 2022-01-18 MED ORDER — LACTATED RINGERS IV SOLN: INTRAVENOUS | Status: DC

## 2022-01-18 MED ORDER — ACETAMINOPHEN 325 MG PO TABS: 325.0000 mg | ORAL_TABLET | Freq: Four times a day (QID) | ORAL | Status: DC | PRN

## 2022-01-18 MED ORDER — METOCLOPRAMIDE HCL 5 MG PO TABS: 5.0000 mg | ORAL_TABLET | Freq: Three times a day (TID) | ORAL | Status: DC | PRN

## 2022-01-18 MED ORDER — MIDAZOLAM HCL 2 MG/2ML IJ SOLN: 1.0000 mg | Freq: Once | INTRAMUSCULAR | Status: AC

## 2022-01-18 MED ORDER — METOPROLOL SUCCINATE ER 25 MG PO TB24
12.5000 mg | ORAL_TABLET | Freq: Every day | ORAL | Status: DC
  Administered 2022-01-18: 12.5 mg via ORAL
  Filled 2022-01-18: qty 1

## 2022-01-18 MED ORDER — BUPIVACAINE-MELOXICAM ER 400-12 MG/14ML IJ SOLN
INTRAMUSCULAR | Status: DC | PRN
  Administered 2022-01-18: 400 mg

## 2022-01-18 MED ORDER — KETOROLAC TROMETHAMINE 15 MG/ML IJ SOLN
15.0000 mg | Freq: Four times a day (QID) | INTRAMUSCULAR | Status: AC
  Administered 2022-01-18 – 2022-01-19 (×4): 15 mg via INTRAVENOUS
  Filled 2022-01-18 (×3): qty 1

## 2022-01-18 MED ORDER — HYDROCODONE-ACETAMINOPHEN 5-325 MG PO TABS: 1.0000 | ORAL_TABLET | ORAL | Status: DC | PRN

## 2022-01-18 MED ORDER — TAMSULOSIN HCL 0.4 MG PO CAPS
0.4000 mg | ORAL_CAPSULE | Freq: Every day | ORAL | Status: DC
  Administered 2022-01-18: 0.4 mg via ORAL
  Filled 2022-01-18: qty 1

## 2022-01-18 MED ORDER — ONDANSETRON HCL 4 MG/2ML IJ SOLN
INTRAMUSCULAR | Status: AC
  Filled 2022-01-18: qty 2

## 2022-01-18 MED ORDER — FERROUS SULFATE 325 (65 FE) MG PO TABS
325.0000 mg | ORAL_TABLET | Freq: Three times a day (TID) | ORAL | Status: DC
  Administered 2022-01-18 – 2022-01-19 (×3): 325 mg via ORAL
  Filled 2022-01-18 (×3): qty 1

## 2022-01-18 MED ORDER — HYDROCODONE-ACETAMINOPHEN 7.5-325 MG PO TABS
1.0000 | ORAL_TABLET | ORAL | Status: DC | PRN
  Administered 2022-01-18 – 2022-01-19 (×3): 1 via ORAL
  Filled 2022-01-18 (×3): qty 1

## 2022-01-18 MED ORDER — TRANEXAMIC ACID 1000 MG/10ML IV SOLN
INTRAVENOUS | Status: DC | PRN
  Administered 2022-01-18: 2000 mg via TOPICAL

## 2022-01-18 MED ORDER — ASPIRIN 81 MG PO CHEW
81.0000 mg | CHEWABLE_TABLET | Freq: Two times a day (BID) | ORAL | Status: DC
  Administered 2022-01-18: 81 mg via ORAL
  Filled 2022-01-18: qty 1

## 2022-01-18 MED ORDER — ACETAMINOPHEN 500 MG PO TABS
1000.0000 mg | ORAL_TABLET | Freq: Once | ORAL | Status: AC
  Administered 2022-01-18: 1000 mg via ORAL
  Filled 2022-01-18: qty 2

## 2022-01-18 MED ORDER — METOCLOPRAMIDE HCL 5 MG/ML IJ SOLN: 5.0000 mg | Freq: Three times a day (TID) | INTRAMUSCULAR | Status: DC | PRN

## 2022-01-18 MED ORDER — ONDANSETRON HCL 4 MG/2ML IJ SOLN: 4.0000 mg | Freq: Once | INTRAMUSCULAR | Status: DC | PRN

## 2022-01-18 MED ORDER — HYDROXYZINE HCL 50 MG/ML IM SOLN
50.0000 mg | Freq: Four times a day (QID) | INTRAMUSCULAR | Status: DC | PRN
  Administered 2022-01-18 – 2022-01-19 (×2): 50 mg via INTRAMUSCULAR
  Filled 2022-01-18 (×2): qty 1

## 2022-01-18 MED ORDER — KETOROLAC TROMETHAMINE 15 MG/ML IJ SOLN
INTRAMUSCULAR | Status: AC
  Filled 2022-01-18: qty 1

## 2022-01-18 MED ORDER — METHOCARBAMOL 500 MG PO TABS: 500.0000 mg | ORAL_TABLET | Freq: Four times a day (QID) | ORAL | Status: DC | PRN

## 2022-01-18 MED ORDER — BUPIVACAINE-MELOXICAM ER 400-12 MG/14ML IJ SOLN
INTRAMUSCULAR | Status: AC
  Filled 2022-01-18: qty 1

## 2022-01-18 MED ORDER — PHENOL 1.4 % MT LIQD: 1.0000 | OROMUCOSAL | Status: DC | PRN

## 2022-01-18 MED ORDER — FENTANYL CITRATE (PF) 100 MCG/2ML IJ SOLN: 50.0000 ug | Freq: Once | INTRAMUSCULAR | Status: AC

## 2022-01-18 MED ORDER — ACETAMINOPHEN 500 MG PO TABS
500.0000 mg | ORAL_TABLET | Freq: Four times a day (QID) | ORAL | Status: DC
  Administered 2022-01-18 – 2022-01-19 (×3): 500 mg via ORAL
  Filled 2022-01-18 (×3): qty 1

## 2022-01-18 MED ORDER — DEXAMETHASONE SODIUM PHOSPHATE 10 MG/ML IJ SOLN
10.0000 mg | Freq: Once | INTRAMUSCULAR | Status: AC
  Administered 2022-01-19: 10 mg via INTRAVENOUS
  Filled 2022-01-18: qty 1

## 2022-01-18 MED ORDER — BUPIVACAINE HCL (PF) 0.5 % IJ SOLN
INTRAMUSCULAR | Status: DC | PRN
  Administered 2022-01-18: 30 mL

## 2022-01-18 MED ORDER — LEVETIRACETAM 250 MG PO TABS
1000.0000 mg | ORAL_TABLET | Freq: Two times a day (BID) | ORAL | Status: DC
  Administered 2022-01-18 – 2022-01-19 (×2): 1000 mg via ORAL
  Filled 2022-01-18 (×2): qty 4

## 2022-01-18 MED ORDER — ONDANSETRON HCL 4 MG/2ML IJ SOLN
INTRAMUSCULAR | Status: DC | PRN
  Administered 2022-01-18: 4 mg via INTRAVENOUS

## 2022-01-18 MED ORDER — TRANEXAMIC ACID-NACL 1000-0.7 MG/100ML-% IV SOLN
1000.0000 mg | INTRAVENOUS | Status: AC
  Administered 2022-01-18: 1000 mg via INTRAVENOUS
  Filled 2022-01-18: qty 100

## 2022-01-18 MED ORDER — TRANEXAMIC ACID-NACL 1000-0.7 MG/100ML-% IV SOLN
1000.0000 mg | Freq: Once | INTRAVENOUS | Status: AC
  Administered 2022-01-18: 1000 mg via INTRAVENOUS
  Filled 2022-01-18: qty 100

## 2022-01-18 MED ORDER — PROPOFOL 500 MG/50ML IV EMUL
INTRAVENOUS | Status: DC | PRN
  Administered 2022-01-18: 120 ug/kg/min via INTRAVENOUS

## 2022-01-18 MED ORDER — VANCOMYCIN HCL 1000 MG IV SOLR
INTRAVENOUS | Status: DC | PRN
  Administered 2022-01-18: 1000 mg via TOPICAL

## 2022-01-18 MED ORDER — SODIUM CHLORIDE 0.9 % IV SOLN: INTRAVENOUS | Status: DC

## 2022-01-18 MED ORDER — POVIDONE-IODINE 10 % EX SWAB
2.0000 "application " | Freq: Once | CUTANEOUS | Status: AC
  Administered 2022-01-18: 2 via TOPICAL

## 2022-01-18 MED ORDER — VANCOMYCIN HCL 1000 MG IV SOLR
INTRAVENOUS | Status: AC
  Filled 2022-01-18: qty 20

## 2022-01-18 MED ORDER — CEFAZOLIN SODIUM-DEXTROSE 2-4 GM/100ML-% IV SOLN
2.0000 g | Freq: Four times a day (QID) | INTRAVENOUS | Status: AC
  Administered 2022-01-18 (×2): 2 g via INTRAVENOUS
  Filled 2022-01-18 (×2): qty 100

## 2022-01-18 MED ORDER — METHOCARBAMOL 1000 MG/10ML IJ SOLN: 500.0000 mg | Freq: Four times a day (QID) | INTRAVENOUS | Status: DC | PRN

## 2022-01-18 MED ORDER — EPHEDRINE SULFATE-NACL 50-0.9 MG/10ML-% IV SOSY
PREFILLED_SYRINGE | INTRAVENOUS | Status: DC | PRN
  Administered 2022-01-18: 5 mg via INTRAVENOUS

## 2022-01-18 MED ORDER — 0.9 % SODIUM CHLORIDE (POUR BTL) OPTIME
TOPICAL | Status: DC | PRN
  Administered 2022-01-18: 1000 mL

## 2022-01-18 MED ORDER — DOCUSATE SODIUM 100 MG PO CAPS
100.0000 mg | ORAL_CAPSULE | Freq: Two times a day (BID) | ORAL | Status: DC
  Administered 2022-01-18 – 2022-01-19 (×3): 100 mg via ORAL
  Filled 2022-01-18 (×3): qty 1

## 2022-01-18 MED ORDER — AMISULPRIDE (ANTIEMETIC) 5 MG/2ML IV SOLN: 10.0000 mg | Freq: Once | INTRAVENOUS | Status: DC | PRN

## 2022-01-18 MED ORDER — ONDANSETRON HCL 4 MG PO TABS: 4.0000 mg | ORAL_TABLET | Freq: Four times a day (QID) | ORAL | Status: DC | PRN

## 2022-01-18 MED ORDER — PHENYLEPHRINE HCL-NACL 20-0.9 MG/250ML-% IV SOLN
INTRAVENOUS | Status: DC | PRN
  Administered 2022-01-18: 25 ug/min via INTRAVENOUS

## 2022-01-18 MED ORDER — SODIUM CHLORIDE 0.9 % IR SOLN
Status: DC | PRN
  Administered 2022-01-18: 1000 mL

## 2022-01-18 MED ORDER — FENTANYL CITRATE (PF) 100 MCG/2ML IJ SOLN
25.0000 ug | INTRAMUSCULAR | Status: DC | PRN
  Administered 2022-01-18 (×2): 50 ug via INTRAVENOUS

## 2022-01-18 MED ORDER — BUPIVACAINE IN DEXTROSE 0.75-8.25 % IT SOLN
INTRATHECAL | Status: DC | PRN
  Administered 2022-01-18: 1.6 mL via INTRATHECAL

## 2022-01-18 MED ORDER — FENTANYL CITRATE (PF) 100 MCG/2ML IJ SOLN
INTRAMUSCULAR | Status: AC
  Filled 2022-01-18: qty 2

## 2022-01-18 MED ORDER — FENTANYL CITRATE (PF) 100 MCG/2ML IJ SOLN
INTRAMUSCULAR | Status: AC
  Administered 2022-01-18: 50 ug via INTRAVENOUS
  Filled 2022-01-18: qty 2

## 2022-01-18 MED ORDER — MORPHINE SULFATE (PF) 2 MG/ML IV SOLN: 0.5000 mg | INTRAVENOUS | Status: DC | PRN

## 2022-01-18 MED ORDER — CEFAZOLIN SODIUM-DEXTROSE 2-4 GM/100ML-% IV SOLN
2.0000 g | INTRAVENOUS | Status: AC
  Administered 2022-01-18: 2 g via INTRAVENOUS
  Filled 2022-01-18: qty 100

## 2022-01-18 MED ORDER — ONDANSETRON HCL 4 MG/2ML IJ SOLN
4.0000 mg | Freq: Four times a day (QID) | INTRAMUSCULAR | Status: DC | PRN
  Administered 2022-01-18: 4 mg via INTRAVENOUS

## 2022-01-18 SURGICAL SUPPLY — 80 items
ALCOHOL 70% 16 OZ (MISCELLANEOUS) ×2 IMPLANT
BAG COUNTER SPONGE SURGICOUNT (BAG) IMPLANT
BAG DECANTER FOR FLEXI CONT (MISCELLANEOUS) ×2 IMPLANT
BANDAGE ESMARK 6X9 LF (GAUZE/BANDAGES/DRESSINGS) IMPLANT
BLADE SAG 18X100X1.27 (BLADE) ×2 IMPLANT
BNDG ESMARK 6X9 LF (GAUZE/BANDAGES/DRESSINGS)
BOWL SMART MIX CTS (DISPOSABLE) ×2 IMPLANT
CLSR STERI-STRIP ANTIMIC 1/2X4 (GAUZE/BANDAGES/DRESSINGS) ×4 IMPLANT
COMP FEM CMT PERSONA SZ9 RT (Joint) ×2 IMPLANT
COMPONENT FEM CMT PRSONA SZ9RT (Joint) IMPLANT
COOLER ICEMAN CLASSIC (MISCELLANEOUS) ×2 IMPLANT
COVER SURGICAL LIGHT HANDLE (MISCELLANEOUS) ×2 IMPLANT
CUFF TOURN SGL QUICK 34 (TOURNIQUET CUFF) ×1
CUFF TOURN SGL QUICK 42 (TOURNIQUET CUFF) IMPLANT
CUFF TRNQT CYL 34X4.125X (TOURNIQUET CUFF) ×1 IMPLANT
DERMABOND ADVANCED (GAUZE/BANDAGES/DRESSINGS) ×1
DERMABOND ADVANCED .7 DNX12 (GAUZE/BANDAGES/DRESSINGS) ×1 IMPLANT
DRAPE EXTREMITY T 121X128X90 (DISPOSABLE) ×2 IMPLANT
DRAPE HALF SHEET 40X57 (DRAPES) ×2 IMPLANT
DRAPE INCISE IOBAN 66X45 STRL (DRAPES) ×2 IMPLANT
DRAPE ORTHO SPLIT 77X108 STRL (DRAPES) ×2
DRAPE POUCH INSTRU U-SHP 10X18 (DRAPES) ×2 IMPLANT
DRAPE SURG ORHT 6 SPLT 77X108 (DRAPES) ×2 IMPLANT
DRAPE U-SHAPE 47X51 STRL (DRAPES) ×4 IMPLANT
DRSG AQUACEL AG ADV 3.5X10 (GAUZE/BANDAGES/DRESSINGS) ×2 IMPLANT
DURAPREP 26ML APPLICATOR (WOUND CARE) ×6 IMPLANT
ELECT CAUTERY BLADE 6.4 (BLADE) ×2 IMPLANT
ELECT REM PT RETURN 9FT ADLT (ELECTROSURGICAL) ×2
ELECTRODE REM PT RTRN 9FT ADLT (ELECTROSURGICAL) ×1 IMPLANT
GLOVE BIOGEL PI IND STRL 7.0 (GLOVE) ×1 IMPLANT
GLOVE BIOGEL PI IND STRL 7.5 (GLOVE) ×4 IMPLANT
GLOVE BIOGEL PI INDICATOR 7.0 (GLOVE) ×1
GLOVE BIOGEL PI INDICATOR 7.5 (GLOVE) ×4
GLOVE ECLIPSE 7.0 STRL STRAW (GLOVE) ×6 IMPLANT
GLOVE SKINSENSE NS SZ7.5 (GLOVE) ×3
GLOVE SKINSENSE STRL SZ7.5 (GLOVE) ×3 IMPLANT
GLOVE SURG UNDER LTX SZ7.5 (GLOVE) ×4 IMPLANT
GLOVE SURG UNDER POLY LF SZ7 (GLOVE) ×4 IMPLANT
GOWN STRL REIN XL XLG (GOWN DISPOSABLE) ×2 IMPLANT
GOWN STRL REUS W/ TWL LRG LVL3 (GOWN DISPOSABLE) ×1 IMPLANT
GOWN STRL REUS W/TWL LRG LVL3 (GOWN DISPOSABLE) ×1
HANDPIECE INTERPULSE COAX TIP (DISPOSABLE) ×1
HDLS TROCR DRIL PIN KNEE 75 (PIN) ×1
HOOD PEEL AWAY FLYTE STAYCOOL (MISCELLANEOUS) ×4 IMPLANT
INSERT TIB ASF PS 8-11 GH RT (Insert) ×1 IMPLANT
JET LAVAGE IRRISEPT WOUND (IRRIGATION / IRRIGATOR) ×2
KIT BASIN OR (CUSTOM PROCEDURE TRAY) ×2 IMPLANT
KIT TURNOVER KIT B (KITS) ×2 IMPLANT
LAVAGE JET IRRISEPT WOUND (IRRIGATION / IRRIGATOR) ×1 IMPLANT
MANIFOLD NEPTUNE II (INSTRUMENTS) ×2 IMPLANT
MARKER SKIN DUAL TIP RULER LAB (MISCELLANEOUS) ×4 IMPLANT
NDL SPNL 18GX3.5 QUINCKE PK (NEEDLE) ×1 IMPLANT
NEEDLE SPNL 18GX3.5 QUINCKE PK (NEEDLE) ×2 IMPLANT
NS IRRIG 1000ML POUR BTL (IV SOLUTION) ×2 IMPLANT
PACK TOTAL JOINT (CUSTOM PROCEDURE TRAY) ×2 IMPLANT
PAD ARMBOARD 7.5X6 YLW CONV (MISCELLANEOUS) ×4 IMPLANT
PAD COLD SHLDR WRAP-ON (PAD) ×2 IMPLANT
PIN DRILL HDLS TROCAR 75 4PK (PIN) IMPLANT
SAW OSC TIP CART 19.5X105X1.3 (SAW) ×2 IMPLANT
SCREW FEMALE HEX FIX 25X2.5 (ORTHOPEDIC DISPOSABLE SUPPLIES) ×1 IMPLANT
SET HNDPC FAN SPRY TIP SCT (DISPOSABLE) ×1 IMPLANT
STAPLER VISISTAT 35W (STAPLE) IMPLANT
STEM POLY PAT PLY 35M KNEE (Knees) ×1 IMPLANT
STEM TIBIA 5 DEG SZ G R KNEE (Knees) IMPLANT
SUCTION FRAZIER HANDLE 10FR (MISCELLANEOUS) ×1
SUCTION TUBE FRAZIER 10FR DISP (MISCELLANEOUS) ×1 IMPLANT
SUT ETHILON 2 0 FS 18 (SUTURE) IMPLANT
SUT MNCRL AB 3-0 PS2 27 (SUTURE) IMPLANT
SUT VIC AB 0 CT1 27 (SUTURE) ×2
SUT VIC AB 0 CT1 27XBRD ANBCTR (SUTURE) ×2 IMPLANT
SUT VIC AB 1 CTX 27 (SUTURE) ×6 IMPLANT
SUT VIC AB 2-0 CT1 27 (SUTURE) ×4
SUT VIC AB 2-0 CT1 TAPERPNT 27 (SUTURE) ×4 IMPLANT
SYR 50ML LL SCALE MARK (SYRINGE) ×4 IMPLANT
TIBIA STEM 5 DEG SZ G R KNEE (Knees) ×2 IMPLANT
TOWEL GREEN STERILE (TOWEL DISPOSABLE) ×2 IMPLANT
TOWEL GREEN STERILE FF (TOWEL DISPOSABLE) ×2 IMPLANT
TRAY CATH 16FR W/PLASTIC CATH (SET/KITS/TRAYS/PACK) IMPLANT
UNDERPAD 30X36 HEAVY ABSORB (UNDERPADS AND DIAPERS) ×2 IMPLANT
YANKAUER SUCT BULB TIP NO VENT (SUCTIONS) ×4 IMPLANT

## 2022-01-18 NOTE — Evaluation (Signed)
Physical Therapy Evaluation Patient Details Name: Erik Salazar MRN: [id number] DOB: 10/12/1950 Today's Date: 01/18/2022  History of Present Illness  Pt is a 71 y/o male s/p R TKA. PMH includes CVA, s/p ACDF, seizures, fibromyalgia, and s/p L TKA.  Clinical Impression  Pt is s/p surgery above with deficits below. Pt requiring min guard A for mobility tasks this session using RW. Tolerated ambulation very well. Reviewed knee precautions and HEP. Will continue to follow acutely.        Recommendations for follow up therapy are one component of a multi-disciplinary discharge planning process, led by the attending physician.  Recommendations may be updated based on patient status, additional functional criteria and insurance authorization.  Follow Up Recommendations Follow physician's recommendations for discharge plan and follow up therapies    Assistance Recommended at Discharge Intermittent Supervision/Assistance  Patient can return home with the following  Assist for transportation;Assistance with cooking/housework    Equipment Recommendations None recommended by PT  Recommendations for Other Services       Functional Status Assessment Patient has had a recent decline in their functional status and demonstrates the ability to make significant improvements in function in a reasonable and predictable amount of time.     Precautions / Restrictions Precautions Precautions: Knee Precaution Booklet Issued: No Precaution Comments: Verbally reviewed knee precautions. Restrictions Weight Bearing Restrictions: Yes RLE Weight Bearing: Weight bearing as tolerated      Mobility  Bed Mobility Overal bed mobility: Needs Assistance Bed Mobility: Supine to Sit     Supine to sit: Supervision     General bed mobility comments: supervision for safety.    Transfers Overall transfer level: Needs assistance Equipment used: Rolling walker (2 wheels) Transfers: Sit to/from Stand Sit to  Stand: Min guard           General transfer comment: Min guard for safety. Cues for hand placement.    Ambulation/Gait Ambulation/Gait assistance: Min guard Gait Distance (Feet): 200 Feet Assistive device: Rolling walker (2 wheels) Gait Pattern/deviations: Step-through pattern, Decreased stride length, Knee flexed in stance - right, Decreased weight shift to right Gait velocity: Decreased     General Gait Details: Slow, mildly antalgic gait. Mild knee instability noted on the R. Cues for sequencing.  Stairs            Wheelchair Mobility    Modified Rankin (Stroke Patients Only)       Balance Overall balance assessment: Needs assistance Sitting-balance support: No upper extremity supported, Feet supported Sitting balance-Leahy Scale: Good     Standing balance support: Bilateral upper extremity supported Standing balance-Leahy Scale: Poor Standing balance comment: Reliant on BUE support                             Pertinent Vitals/Pain Pain Assessment Pain Assessment: 0-10 Pain Score: 5  Pain Location: R knee Pain Descriptors / Indicators: Guarding, Grimacing Pain Intervention(s): Monitored during session, Limited activity within patient's tolerance, Repositioned    Home Living Family/patient expects to be discharged to:: Private residence Living Arrangements: Spouse/significant other Available Help at Discharge: Family Type of Home: House Home Access: Level entry       Home Layout: One level Home Equipment: Conservation officer, nature (2 wheels);Grab bars - tub/shower;Other (comment) (CPM)      Prior Function Prior Level of Function : Independent/Modified Independent                     Hand  Dominance        Extremity/Trunk Assessment   Upper Extremity Assessment Upper Extremity Assessment: Overall WFL for tasks assessed    Lower Extremity Assessment Lower Extremity Assessment: RLE deficits/detail RLE Deficits / Details: Deficits  consistent with post op pain and weakness.    Cervical / Trunk Assessment Cervical / Trunk Assessment: Normal  Communication   Communication: No difficulties  Cognition Arousal/Alertness: Awake/alert Behavior During Therapy: WFL for tasks assessed/performed Overall Cognitive Status: Within Functional Limits for tasks assessed                                          General Comments      Exercises Total Joint Exercises Ankle Circles/Pumps: AROM, Both, 20 reps Quad Sets: AROM, Right, 10 reps, Seated Heel Slides:  (verbally reviewed; pt in recliner so difficult to perform)   Assessment/Plan    PT Assessment Patient needs continued PT services  PT Problem List Decreased range of motion;Decreased strength;Decreased activity tolerance;Decreased balance;Decreased mobility;Decreased knowledge of use of DME;Decreased knowledge of precautions;Pain       PT Treatment Interventions Gait training;DME instruction;Therapeutic activities;Functional mobility training;Therapeutic exercise;Balance training;Patient/family education    PT Goals (Current goals can be found in the Care Plan section)  Acute Rehab PT Goals Patient Stated Goal: to go home PT Goal Formulation: With patient Time For Goal Achievement: 02/01/22 Potential to Achieve Goals: Good    Frequency 7X/week     Co-evaluation               AM-PAC PT "6 Clicks" Mobility  Outcome Measure Help needed turning from your back to your side while in a flat bed without using bedrails?: None Help needed moving from lying on your back to sitting on the side of a flat bed without using bedrails?: A Little Help needed moving to and from a bed to a chair (including a wheelchair)?: A Little Help needed standing up from a chair using your arms (e.g., wheelchair or bedside chair)?: A Little Help needed to walk in hospital room?: A Little Help needed climbing 3-5 steps with a railing? : A Little 6 Click Score:  19    End of Session Equipment Utilized During Treatment: Gait belt Activity Tolerance: Patient tolerated treatment well Patient left: in chair;with call bell/phone within reach;with nursing/sitter in room;with family/visitor present Nurse Communication: Mobility status PT Visit Diagnosis: Other abnormalities of gait and mobility (R26.89);Pain Pain - Right/Left: Right Pain - part of body: Knee    Time: 1194-1740 PT Time Calculation (min) (ACUTE ONLY): 21 min   Charges:   PT Evaluation $PT Eval Low Complexity: 1 Low          Reuel Derby, PT, DPT  Acute Rehabilitation Services  Office: [phone]   Rudean Hitt 01/18/2022, 4:20 PM

## 2022-01-18 NOTE — Anesthesia Procedure Notes (Signed)
Spinal

## 2022-01-18 NOTE — Anesthesia Postprocedure Evaluation (Signed)
Anesthesia Post Note  Patient: Erik Salazar  Procedure(s) Performed: RIGHT TOTAL KNEE ARTHROPLASTY (Right: Knee)     Patient location during evaluation: PACU Anesthesia Type: Regional and Spinal Level of consciousness: awake and alert Pain management: pain level controlled Vital Signs Assessment: post-procedure vital signs reviewed and stable Respiratory status: spontaneous breathing and respiratory function stable Cardiovascular status: stable Postop Assessment: no apparent nausea or vomiting Anesthetic complications: no   No notable events documented.  Last Vitals:  Vitals:   01/18/22 1230 01/18/22 1235  BP:  138/70  Pulse: 69 68  Resp: 15 17  Temp:    SpO2: 98% 98%    Last Pain:  Vitals:   01/18/22 1205  TempSrc:   PainSc: 5                  Candra R Niles Ess

## 2022-01-18 NOTE — H&P (Signed)
PREOPERATIVE H&P  Chief Complaint: RIGHT KNEE DEGENERATIVE JOINT DISEASE  HPI: Erik Salazar is a 71 y.o. male who presents for surgical treatment of RIGHT KNEE DEGENERATIVE JOINT DISEASE.  He denies any changes in medical history.  Past Medical History:  Diagnosis Date   Arthritis    RA and OA   Carpal tunnel syndrome on right 07/08/2017   Cervical radiculopathy at C6 87/56/4332   Right   Complication of anesthesia    Coronary artery disease    Enlarged prostate    Fibromyalgia    H/O: knee surgery    15    History of blood clots    History of open heart surgery    ASCENDING AORTIC ANEURYSM   Ischemic stroke of frontal lobe ([place]) 03/14/2017   Bilateral; post-redo CT surgery   Pneumonia    PONV (postoperative nausea and vomiting)    only after CABG surgeries   Seizure disorder ([place]) 03/14/2017   Seizures ([place])    Status post knee surgery    DVT POST KNEE SURGERY   Stroke [facility])    Past Surgical History:  Procedure Laterality Date   ANTERIOR CERVICAL DECOMP/DISCECTOMY FUSION N/A 05/04/2020   Procedure: Anterior Cervical Decompression/Discectomy Fuion Cervical three-four, Cervical four-five, Cervical five-six;  Surgeon: Kary Kos, MD;  Location: [place];  Service: Neurosurgery;  Laterality: N/A;   BALLOON DILATION N/A 06/23/2019   Procedure: BALLOON DILATION;  Surgeon: Otis Brace, MD;  [facility];  Service: Gastroenterology;  Laterality: N/A;   BENTALL PROCEDURE  01/04/2016   Bentall with 23 mm pericardial AVR; SVG-LAD, SVG-CX ([place])   BICEPS TENDON REPAIR Right    BIOPSY  06/23/2019   Procedure: BIOPSY;  Surgeon: Otis Brace, MD;  [facility]: [facility];  Service: Gastroenterology;;   CARDIAC SURGERY     ANUERSYM MAY 2017   Bentall procedure. Bioprosthetic aortic valve #23 mm bovine model #2700 TF ask, and 28 mm Gelweave woven vascular sinus of Valsalva graft   CARPAL TUNNEL RELEASE  08/2018   right hand    CORONARY  ARTERY BYPASS GRAFT  01/04/2016   VG to LAD & VG to LCX   CORONARY ARTERY BYPASS GRAFT  03/13/2017   LIMA to LAD with steril abcess removal from dacron graft   ESOPHAGOGASTRODUODENOSCOPY     Had done dilatation done about 2 or 3 times before in [place]   ESOPHAGOGASTRODUODENOSCOPY (EGD) WITH PROPOFOL N/A 06/23/2019   Procedure: ESOPHAGOGASTRODUODENOSCOPY (EGD) WITH PROPOFOL;  Surgeon: Otis Brace, MD;  [facility];  Service: Gastroenterology;  Laterality: N/A;   FALSE ANEURYSM REPAIR  03/13/2017   redo sternotomy, sterile abscess removal from Dacron graft, omental flap around aorta, CABG: LIMA-LAD ([name], Dr. Mart Piggs)   [place]  2018   Of pericardium 2018   INSERTION OF MESH  10/14/2020   Procedure: INSERTION OF MESH;  Surgeon: Michael Boston, MD;  Location: [place];  Service: General;;   knee surgeries     13 surgeries on knee done before knee replacement    [place] N/A 05/19/2021   Procedure: LYSIS OF ADHESIONS;  Surgeon: Michael Boston, MD;  Location: [place];  Service: General;  Laterality: N/A;  GEN & LOCAL   LYSIS OF ADHESION N/A 10/14/2020   Procedure: LYSIS OF ADHESION;  Surgeon: Michael Boston, MD;  Location: [place];  Service: General;  Laterality: N/A;   open heart surgery     03-13-2017  REPLACEMENT TOTAL KNEE Left 2015   ROTATOR CUFF REPAIR Right    done with biceps tendon repair   TONSILLECTOMY     removed as a child.   VENTRAL HERNIA REPAIR N/A 10/14/2020   Procedure: LAPAROSCOPIC VENTRAL HERNIA REPAIR WITH TAP BLOCK BILATERAL;  Surgeon: Michael Boston, MD;  Location: [place];  Service: General;  Laterality: N/A;   VENTRAL HERNIA REPAIR N/A 05/19/2021   Procedure: LAPAROSCOPIC VENTRAL WALL HERNIA REPAIR;  Surgeon: Michael Boston, MD;  Location: [place];  Service: General;  Laterality: N/A;   Social History   Socioeconomic History   Marital status: Married    Spouse name: Not on file   Number of  children: 3   Years of education: Not on file   Highest education level: Not on file  Occupational History   Occupation: RETIRED  Tobacco Use   Smoking status: Former    Types: Cigars    Quit date: 2021    Years since quitting: 2.4   Smokeless tobacco: Never   Tobacco comments:    Only smoked cigars for 6 months  Vaping Use   Vaping Use: Never used  Substance and Sexual Activity   Alcohol use: Yes    Alcohol/week: 1.0 standard drink    Types: 1 Cans of beer per week    Comment: occassionally   Drug use: No   Sexual activity: Not on file  Other Topics Concern   Not on file  Social History Narrative   Lives at home with wife, is retired.  Education: 2 yrs college.   2 Children.   Social Determinants of Health   Financial Resource Strain: Low Risk    Difficulty of Paying Living Expenses: Not hard at all  Food Insecurity: No Food Insecurity   Worried About Charity fundraiser in the Last Year: Never true   [place] in the Last Year: Never true  Transportation Needs: No Transportation Needs   Lack of Transportation (Medical): No   Lack of Transportation (Non-Medical): No  Physical Activity: Sufficiently Active   Days of Exercise per Week: 7 days   Minutes of Exercise per Session: 60 min  Stress: No Stress Concern Present   Feeling of Stress : Not at all  Social Connections: Socially Integrated   Frequency of Communication with Friends and Family: More than three times a week   Frequency of Social Gatherings with Friends and Family: More than three times a week   Attends Religious Services: More than 4 times per year   Active Member of [employer] or Organizations: Yes   Attends Music therapist: More than 4 times per year   Marital Status: Married   Family History  Problem Relation Age of Onset   Arthritis Mother    Aneurysm Mother        brain aneurysm for mother.    Arthritis Father    Colon cancer Neg Hx    Esophageal cancer Neg Hx    Allergies   Allergen Reactions   Oxycodone Hcl Other (See Comments)    DELUSIONAL   Zetia [Ezetimibe] Other (See Comments)    Leg cramps   Augmentin [Amoxicillin-Pot Clavulanate] Nausea Only    Dizzy   Chlorhexidine Rash   Elemental Sulfur Rash   Prior to Admission medications   Medication Sig Start Date End Date Taking? Authorizing Provider  ascorbic acid (VITAMIN C) 500 MG tablet Take 500 mg by mouth daily.   Yes [provider]  Cholecalciferol (  VITAMIN D3) 50 MCG (2000 UT) TABS Take 2,000 Units by mouth daily.   Yes [provider]  levETIRAcetam (KEPPRA) 1000 MG tablet Take 1 tablet (1,000 mg total) by mouth 2 (two) times daily. 07/31/21  Yes Marcial Pacas, MD  metoprolol succinate (TOPROL-XL) 25 MG 24 hr tablet Take 1/2 tablet (12.5 mg total) by mouth daily. With or immediately following a meal. 11/28/21  Yes Jerline Pain, MD  pravastatin (PRAVACHOL) 40 MG tablet Take 1 tablet (40 mg total) by mouth every evening. 01/18/21  Yes Jerline Pain, MD  pyridOXINE (VITAMIN B-6) 100 MG tablet Take 100 mg by mouth daily.   Yes [provider]  tamsulosin (FLOMAX) 0.4 MG CAPS capsule Take 2 capsules by mouth everyday 11/15/21  Yes   aspirin EC 81 MG tablet Take 1 tablet (81 mg total) by mouth in the morning and at bedtime. To be taken after surgery to prevent blood clots 01/12/22 01/12/23  Aundra Dubin, PA-C  COVID-19 mRNA bivalent vaccine, Pfizer, (PFIZER COVID-19 VAC BIVALENT) injection Inject into the muscle. 12/18/21   Carlyle Basques, MD  docusate sodium (COLACE) 100 MG capsule Take 1 capsule (100 mg total) by mouth daily as needed. 01/12/22 01/12/23  Aundra Dubin, PA-C  HYDROcodone-acetaminophen (NORCO) 7.5-325 MG tablet Take 1-2 tablets by mouth every 6 (six) hours as needed for moderate pain. To be taken after surgery 01/12/22   Aundra Dubin, PA-C  methocarbamol (ROBAXIN-750) 750 MG tablet Take 1 tablet (750 mg total) by mouth 2 (two) times daily as needed for muscle  spasms. 01/12/22   Aundra Dubin, PA-C  ondansetron (ZOFRAN) 4 MG tablet Take 1 tablet (4 mg total) by mouth every 8 (eight) hours as needed for nausea or vomiting. 01/12/22   Aundra Dubin, PA-C  sildenafil (REVATIO) 20 MG tablet Take 2 -3 tablets by mouth once daily as needed 10/17/21        Positive ROS: All other systems have been reviewed and were otherwise negative with the exception of those mentioned in the HPI and as above.  Physical Exam: General: Alert, no acute distress Cardiovascular: No pedal edema Respiratory: No cyanosis, no use of accessory musculature GI: abdomen soft Skin: No lesions in the area of chief complaint Neurologic: Sensation intact distally Psychiatric: Patient is competent for consent with normal mood and affect Lymphatic: no lymphedema  MUSCULOSKELETAL: exam stable  Assessment: RIGHT KNEE DEGENERATIVE JOINT DISEASE  Plan: Plan for Procedure(s): RIGHT TOTAL KNEE ARTHROPLASTY  The risks benefits and alternatives were discussed with the patient including but not limited to the risks of nonoperative treatment, versus surgical intervention including infection, bleeding, nerve injury,  blood clots, cardiopulmonary complications, morbidity, mortality, among others, and they were willing to proceed.   Preoperative templating of the joint replacement has been completed, documented, and submitted to the Operating Room personnel in order to optimize intra-operative equipment management.   Eduard Roux, MD 01/18/2022 7:06 AM

## 2022-01-18 NOTE — Discharge Instructions (Signed)

## 2022-01-18 NOTE — Op Note (Signed)
Total Knee Arthroplasty Procedure Note  Preoperative diagnosis: Right knee osteoarthritis  Postoperative diagnosis:same  Operative procedure: Right total knee arthroplasty. CPT [phone]  Surgeon: N. Eduard Roux, MD  Assist: Madalyn Rob, PA-C; necessary for the timely completion of procedure and due to complexity of procedure.  Anesthesia: Spinal, regional, local  Tourniquet time: see anesthesia record  Implants used: Zimmer persona Femur: CR 9 Tibia: G Patella: 35 mm Polyethylene: 12 mm, MC  Indication: Erik Salazar is a 71 y.o. year old male with a history of knee pain. Having failed conservative management, the patient elected to proceed with a total knee arthroplasty.  We have reviewed the risk and benefits of the surgery and they elected to proceed after voicing understanding.  Procedure:  After informed consent was obtained and understanding of the risk were voiced including but not limited to bleeding, infection, damage to surrounding structures including nerves and vessels, blood clots, leg length inequality and the failure to achieve desired results, the operative extremity was marked with verbal confirmation of the patient in the holding area.   The patient was then brought to the operating room and transported to the operating room table in the supine position.  A tourniquet was applied to the operative extremity around the upper thigh. The operative limb was then prepped and draped in the usual sterile fashion and preoperative antibiotics were administered.  A time out was performed prior to the start of surgery confirming the correct extremity, preoperative antibiotic administration, as well as team members, implants and instruments available for the case. Correct surgical site was also confirmed with preoperative radiographs. The limb was then elevated for exsanguination and the tourniquet was inflated. A midline incision was made and a standard medial  parapatellar approach was performed.  The infrapatellar fat pad was removed.  Suprapatellar synovium was removed to reveal the anterior distal femoral cortex.  A medial peel was performed to release the capsule of the medial tibial plateau.  The patella was then everted and was prepared and sized to a 35 mm.  A cover was placed on the patella for protection from retractors.  The knee was then brought into full flexion and we then turned our attention to the femur.  The cruciates were sacrificed.  Start site was drilled in the femur and the intramedullary distal femoral cutting guide was placed, set at 5 degrees valgus, taking 10 mm of distal resection. The distal cut was made. Osteophytes were then removed.  Next, the proximal tibial cutting guide was placed with appropriate slope, varus/valgus alignment and depth of resection. The proximal tibial cut was made taking 2 mm off the low side. Gap blocks were then used to assess the extension gap and alignment, and appropriate soft tissue releases were performed. Attention was turned back to the femur, which was sized using the sizing guide to a size 9. Appropriate rotation of the femoral component was determined using epicondylar axis, Whiteside's line, and assessing the flexion gap under ligament tension. The appropriate size 4-in-1 cutting block was placed and checked with an angel wing and cuts were made. Posterior femoral osteophytes and uncapped bone were then removed with the curved osteotome.  Trial components were placed, and stability was checked in full extension, mid-flexion, and deep flexion. Proper tibial rotation was determined and marked.  The patella tracked well without a lateral release.  The femoral lugs were then drilled. Trial components were then removed and tibial preparation performed.  The tibia was sized for a size  G component.   The bony surfaces were irrigated with a pulse lavage and then dried. Bone cement was vacuum mixed on the back  table, and the final components sized above were cemented into place.  Antibiotic irrigation was placed in the knee joint and soft tissues while the cement cured.  After cement had finished curing, excess cement was removed. The stability of the construct was re-evaluated throughout a range of motion and found to be acceptable. The trial liner was removed, the knee was copiously irrigated, and the knee was re-evaluated for any excess bone debris. The real polyethylene liner, 12 mm thick, was inserted and checked to ensure the locking mechanism had engaged appropriately. The tourniquet was deflated and hemostasis was achieved. The wound was irrigated with normal saline.  One gram of vancomycin powder was placed in the surgical bed.  Topical 0.25% bupivacaine and meloxicam was placed in the joint for postoperative pain.  Capsular closure was performed with a #1 vicryl, subcutaneous fat closed with a 0 vicryl suture, then subcutaneous tissue closed with interrupted 2.0 vicryl suture. The skin was then closed with a 2.0 nylon and dermabond. A sterile dressing was applied.  The patient was awakened in the operating room and taken to recovery in stable condition. All sponge, needle, and instrument counts were correct at the end of the case.  Tawanna Cooler was necessary for opening, closing, retracting, limb positioning and overall facilitation and completion of the surgery.  Position: supine  Complications: none.  Time Out: performed   Drains/Packing: none  Estimated blood loss: minimal  Returned to Recovery Room: in good condition.   Antibiotics: yes   Mechanical VTE (DVT) Prophylaxis: sequential compression devices, TED thigh-high  Chemical VTE (DVT) Prophylaxis: aspirin  Fluid Replacement  Crystalloid: see anesthesia record Blood: none  FFP: none   Specimens Removed: 1 to pathology   Sponge and Instrument Count Correct? yes   PACU: portable radiograph - knee AP and Lateral   Plan/RTC:  Return in 2 weeks for wound check.   Weight Bearing/Load Lower Extremity: full   Implant Name Type Inv. Item Serial No. Manufacturer Lot No. LRB No. Used Action  COMP FEM CMT PERSONA SZ9 RT - [id number] Joint COMP FEM CMT PERSONA SZ9 RT  ZIMMER RECON(ORTH,TRAU,BIO,SG) [id number] Right 1 Implanted  INSERT TIB ASF PS 8-11 GH RT - [id number] Insert INSERT TIB ASF PS 8-11 GH RT  ZIMMER RECON(ORTH,TRAU,BIO,SG) [id number] Right 1 Implanted  TIBIA STEM 5 DEG SZ G R KNEE - [id number] Knees TIBIA STEM 5 DEG SZ G R KNEE  ZIMMER RECON(ORTH,TRAU,BIO,SG) [id number] Right 1 Implanted  STEM POLY PAT PLY 72M KNEE - [id number] Knees STEM POLY PAT PLY 72M KNEE  ZIMMER RECON(ORTH,TRAU,BIO,SG) [id number] Right 1 Implanted    N. Eduard Roux, MD [facility] 11:27 AM

## 2022-01-18 NOTE — Anesthesia Procedure Notes (Signed)
Anesthesia Regional Block: Adductor canal block   Pre-Anesthetic Checklist: , timeout performed,  Correct Patient, Correct Site, Correct Laterality,  Correct Procedure, Correct Position, site marked,  Risks and benefits discussed,  Surgical consent,  Pre-op evaluation,  At surgeon's request and post-op pain management  Laterality: Right  Prep: chloraprep       Needles:  Injection technique: Single-shot  Needle Type: Echogenic Stimulator Needle     Needle Length: 10cm  Needle Gauge: 20     Additional Needles:   Procedures:,,,, ultrasound used (permanent image in chart),,    Narrative:  Start time: 01/18/2022 8:20 AM End time: 01/18/2022 8:25 AM Injection made incrementally with aspirations every 5 mL.  Performed by: Personally  Anesthesiologist: Merlinda Frederick, MD  Additional Notes: Functioning IV was confirmed and monitors were applied.  Sterile prep and drape,hand hygiene and sterile gloves were used. Ultrasound guidance: relevant anatomy identified, needle position confirmed, local anesthetic spread visualized around nerve(s)., vascular puncture avoided. Negative aspiration and negative test dose prior to incremental administration of local anesthetic. The patient tolerated the procedure well.

## 2022-01-18 NOTE — Transfer of Care (Signed)
Immediate Anesthesia Transfer of Care Note  Patient: Erik Salazar  Procedure(s) Performed: RIGHT TOTAL KNEE ARTHROPLASTY (Right: Knee)  Patient Location: PACU  Anesthesia Type:MAC  Level of Consciousness: drowsy and patient cooperative  Airway & Oxygen Therapy: Patient Spontanous Breathing and Patient connected to nasal cannula oxygen  Post-op Assessment: Report given to RN and Post -op Vital signs reviewed and stable  Post vital signs: Reviewed and stable  Last Vitals:  Vitals Value Taken Time  BP 139/81 01/18/22 1316  Temp 36.7 C 01/18/22 1316  Pulse 61 01/18/22 1316  Resp 18 01/18/22 1316  SpO2 98 % 01/18/22 1316    Last Pain:  Vitals:   01/18/22 1250  TempSrc:   PainSc: 5       Patients Stated Pain Goal: 3 (35/00/93 8182)  Complications: No notable events documented.

## 2022-01-19 ENCOUNTER — Other Ambulatory Visit: Payer: Self-pay | Admitting: Cardiology

## 2022-01-19 ENCOUNTER — Encounter (HOSPITAL_COMMUNITY): Payer: Self-pay | Admitting: Orthopaedic Surgery

## 2022-01-19 ENCOUNTER — Other Ambulatory Visit (HOSPITAL_BASED_OUTPATIENT_CLINIC_OR_DEPARTMENT_OTHER): Payer: Self-pay

## 2022-01-19 ENCOUNTER — Other Ambulatory Visit: Payer: Self-pay | Admitting: Physician Assistant

## 2022-01-19 DIAGNOSIS — M1711 Unilateral primary osteoarthritis, right knee: Secondary | ICD-10-CM | POA: Diagnosis not present

## 2022-01-19 LAB — CBC
HCT: 40.5 % (ref 39.0–52.0)
Hemoglobin: 13.4 g/dL (ref 13.0–17.0)
MCH: 28.3 pg (ref 26.0–34.0)
MCHC: 33.1 g/dL (ref 30.0–36.0)
MCV: 85.4 fL (ref 80.0–100.0)
Platelets: 241 10*3/uL (ref 150–400)
RBC: 4.74 MIL/uL (ref 4.22–5.81)
RDW: 14.2 % (ref 11.5–15.5)
WBC: 12.1 10*3/uL — ABNORMAL HIGH (ref 4.0–10.5)
nRBC: 0 % (ref 0.0–0.2)

## 2022-01-19 MED ORDER — RIVAROXABAN 10 MG PO TABS
10.0000 mg | ORAL_TABLET | Freq: Every day | ORAL | Status: DC
  Administered 2022-01-19: 10 mg via ORAL
  Filled 2022-01-19: qty 1

## 2022-01-19 MED ORDER — RIVAROXABAN 10 MG PO TABS
10.0000 mg | ORAL_TABLET | Freq: Every day | ORAL | 0 refills | Status: DC
  Filled 2022-01-19: qty 42, 42d supply, fill #0

## 2022-01-19 MED ORDER — PRAVASTATIN SODIUM 40 MG PO TABS
40.0000 mg | ORAL_TABLET | Freq: Every evening | ORAL | 3 refills | Status: DC
  Filled 2022-01-19: qty 90, 90d supply, fill #0
  Filled 2022-04-10: qty 90, 90d supply, fill #1
  Filled 2022-06-26: qty 90, 90d supply, fill #2
  Filled 2022-09-18: qty 90, 90d supply, fill #3

## 2022-01-19 NOTE — Plan of Care (Signed)
  Problem: Safety: Goal: Ability to remain free from injury will improve Outcome: Completed/Met   Problem: Education: Goal: Knowledge of the prescribed therapeutic regimen will improve Outcome: Completed/Met Goal: Individualized Educational Video(s) Outcome: Completed/Met   Problem: Activity: Goal: Ability to avoid complications of mobility impairment will improve Outcome: Completed/Met Goal: Range of joint motion will improve Outcome: Completed/Met   Problem: Clinical Measurements: Goal: Postoperative complications will be avoided or minimized Outcome: Completed/Met   Problem: Pain Management: Goal: Pain level will decrease with appropriate interventions Outcome: Completed/Met   Problem: Skin Integrity: Goal: Will show signs of wound healing Outcome: Completed/Met

## 2022-01-19 NOTE — Discharge Summary (Signed)
Patient ID: Erik Salazar MRN: [id number] DOB/AGE: 1951-05-08 71 y.o.  Admit date: 01/18/2022 Discharge date: 01/19/2022  Admission Diagnoses:  Principal Problem:   Primary osteoarthritis of right knee Active Problems:   Status post total right knee replacement   Discharge Diagnoses:  Same  Past Medical History:  Diagnosis Date   Arthritis    RA and OA   Carpal tunnel syndrome on right 07/08/2017   Cervical radiculopathy at C6 34/19/6222   Right   Complication of anesthesia    Coronary artery disease    Enlarged prostate    Fibromyalgia    H/O: knee surgery    15    History of blood clots    History of open heart surgery    ASCENDING AORTIC ANEURYSM   Ischemic stroke of frontal lobe ([place]) 03/14/2017   Bilateral; post-redo CT surgery   Pneumonia    PONV (postoperative nausea and vomiting)    only after CABG surgeries   Seizure disorder ([place]) 03/14/2017   Seizures ([place])    Status post knee surgery    DVT POST KNEE SURGERY   Stroke [facility])     Surgeries: Procedure(s): RIGHT TOTAL KNEE ARTHROPLASTY on 01/18/2022   Consultants:   Discharged Condition: Improved  Hospital Course: Erik Salazar is an 71 y.o. male who was admitted 01/18/2022 for operative treatment ofPrimary osteoarthritis of right knee. Patient has severe unremitting pain that affects sleep, daily activities, and work/hobbies. After pre-op clearance the patient was taken to the operating room on 01/18/2022 and underwent  Procedure(s): RIGHT TOTAL KNEE ARTHROPLASTY.    Patient was given perioperative antibiotics:  Anti-infectives (From admission, onward)    Start     Dose/Rate Route Frequency Ordered Stop   01/18/22 1415  ceFAZolin (ANCEF) IVPB 2g/100 mL premix        2 g 200 mL/hr over 30 Minutes Intravenous Every 6 hours 01/18/22 1319 01/18/22 2018   01/18/22 1031  vancomycin (VANCOCIN) powder  Status:  Discontinued          As needed 01/18/22 1031 01/18/22 1203   01/18/22 0715  ceFAZolin (ANCEF) IVPB  2g/100 mL premix        2 g 200 mL/hr over 30 Minutes Intravenous On call to O.R. 01/18/22 9798 01/18/22 1007        Patient was given sequential compression devices, early ambulation, and chemoprophylaxis to prevent DVT.  Patient benefited maximally from hospital stay and there were no complications.    Recent vital signs: Patient Vitals for the past 24 hrs:  BP Temp Temp src Pulse Resp SpO2  01/19/22 0720 (!) 126/56 98.2 F (36.8 C) Oral 91 17 94 %  01/19/22 0339 (!) 118/51 97.6 F (36.4 C) Oral 69 20 95 %  01/19/22 0000 (!) 118/59 98.1 F (36.7 C) Oral 73 20 96 %  01/18/22 1943 (!) 130/56 97.6 F (36.4 C) Oral 78 20 97 %  01/18/22 1610 (!) 153/77 97.6 F (36.4 C) Oral 69 16 100 %  01/18/22 1316 139/81 98 F (36.7 C) -- 61 18 98 %  01/18/22 1250 (!) 149/72 98 F (36.7 C) -- 61 16 93 %  01/18/22 1245 -- -- -- (!) 59 15 98 %  01/18/22 1235 138/70 -- -- 68 17 98 %  01/18/22 1230 -- -- -- 69 15 98 %  01/18/22 1220 (!) 142/70 -- -- 68 10 96 %  01/18/22 1215 -- -- -- 78 14 97 %  01/18/22 1205 121/60 97.7 F (36.5 C) -- 83  19 100 %  01/18/22 0825 (!) 153/71 -- -- 75 15 98 %  01/18/22 0820 (!) 157/85 -- -- 79 17 98 %  01/18/22 0818 (!) 157/82 -- -- 80 19 96 %     Recent laboratory studies:  Recent Labs    01/19/22 0622  WBC 12.1*  HGB 13.4  HCT 40.5  PLT 241     Discharge Medications:   Allergies as of 01/19/2022       Reactions   Oxycodone Hcl Other (See Comments)   DELUSIONAL   Zetia [ezetimibe] Other (See Comments)   Leg cramps   Augmentin [amoxicillin-pot Clavulanate] Nausea Only   Dizzy   Chlorhexidine Rash   Elemental Sulfur Rash        Medication List     STOP taking these medications    aspirin EC 81 MG tablet       TAKE these medications    ascorbic acid 500 MG tablet Commonly known as: VITAMIN C Take 500 mg by mouth daily.   docusate sodium 100 MG capsule Commonly known as: Colace Take 1 capsule (100 mg total) by mouth daily as  needed.   HYDROcodone-acetaminophen 7.5-325 MG tablet Commonly known as: Norco Take 1-2 tablets by mouth every 6 (six) hours as needed for moderate pain. To be taken after surgery   levETIRAcetam 1000 MG tablet Commonly known as: KEPPRA Take 1 tablet (1,000 mg total) by mouth 2 (two) times daily.   methocarbamol 750 MG tablet Commonly known as: Robaxin-750 Take 1 tablet (750 mg total) by mouth 2 (two) times daily as needed for muscle spasms.   metoprolol succinate 25 MG 24 hr tablet Commonly known as: TOPROL-XL Take 1/2 tablet (12.5 mg total) by mouth daily. With or immediately following a meal.   ondansetron 4 MG tablet Commonly known as: Zofran Take 1 tablet (4 mg total) by mouth every 8 (eight) hours as needed for nausea or vomiting.   Pfizer COVID-19 Vac Bivalent injection Generic drug: COVID-19 mRNA bivalent vaccine Therapist, music) Inject into the muscle.   pravastatin 40 MG tablet Commonly known as: PRAVACHOL Take 1 tablet (40 mg total) by mouth every evening.   pyridOXINE 100 MG tablet Commonly known as: VITAMIN B-6 Take 100 mg by mouth daily.   rivaroxaban 10 MG Tabs tablet Commonly known as: XARELTO Take 1 tablet (10 mg total) by mouth daily.   sildenafil 20 MG tablet Commonly known as: REVATIO Take 2 -3 tablets by mouth once daily as needed   tamsulosin 0.4 MG Caps capsule Commonly known as: FLOMAX Take 2 capsules by mouth everyday   Vitamin D3 50 MCG (2000 UT) Tabs Take 2,000 Units by mouth daily.               Durable Medical Equipment  (From admission, onward)           Start     Ordered   01/18/22 1320  DME Walker rolling  Once       Question Answer Comment  Walker: With 5 Inch Wheels   Patient needs a walker to treat with the following condition Status post left partial knee replacement      01/18/22 1319   01/18/22 1320  DME 3 n 1  Once        01/18/22 1319   01/18/22 1320  DME Bedside commode  Once       Question:  Patient needs a  bedside commode to treat with the following condition  Answer:  Status  post left partial knee replacement   01/18/22 1319            Diagnostic Studies: DG Knee Right Port  Result Date: 01/18/2022 CLINICAL DATA:  Status post right knee replacement. EXAM: PORTABLE RIGHT KNEE - 1-2 VIEW COMPARISON:  Jan 04, 2022. FINDINGS: Right femoral and tibial components are well situated. Expected postoperative changes are seen in the soft tissues anteriorly. IMPRESSION: Status post right total knee arthroplasty. Electronically Signed   By: Marijo Conception M.D.   On: 01/18/2022 12:29   XR KNEE 3 VIEW RIGHT  Result Date: 01/04/2022 Tricompartmental osteoarthritis.  Generalized osteopenia.   Disposition: Discharge disposition: 01-Home or Self Care          Follow-up Information     Leandrew Koyanagi, MD. Schedule an appointment as soon as possible for a visit in 2 week(s).   Specialty: Orthopedic Surgery Contact information: [address] [place] [address] [phone]                  Signed: Aundra Dubin 01/19/2022, 8:15 AM

## 2022-01-19 NOTE — Progress Notes (Signed)
Patient transported to his vehicle via wheelchair by RN for discharge home; in no acute distress nor complaints of pain nor discomfort; moves all extremities well; incision on his right knee with hydrocolloid dressing and is clean, dry and intact; room was checked for all his belongings before leaving the unit; discharge instructions concerning his medications, incision care, follow up appointment and when to call the doctor as needed were all discussed with patient and wife by RN and both expressed understanding on the instructions given.

## 2022-01-19 NOTE — Evaluation (Signed)
Occupational Therapy Evaluation Patient Details Name: Erik Salazar MRN: [id number] DOB: October 30, 1950 Today's Date: 01/19/2022   History of Present Illness Pt is a 71 y/o male s/p R TKA. PMH includes CVA, s/p ACDF, seizures, fibromyalgia, and s/p L TKA.   Clinical Impression   PTA patient independent and driving. Admitted for above and presents with R knee pain and decreased ROM, impaired balance s/p TKA.  Demonstrates ability to complete Adls, transfers and mobility in room with modified independence using RW. Educated on knee precautions, activity progression, and ADL compensatory techniques. No further OT needs identified.  OT will sign off.      Recommendations for follow up therapy are one component of a multi-disciplinary discharge planning process, led by the attending physician.  Recommendations may be updated based on patient status, additional functional criteria and insurance authorization.   Follow Up Recommendations  No OT follow up    Assistance Recommended at Discharge PRN  Patient can return home with the following Assistance with cooking/housework;Assist for transportation;Help with stairs or ramp for entrance    Functional Status Assessment     Equipment Recommendations  None recommended by OT    Recommendations for Other Services       Precautions / Restrictions Precautions Precautions: Knee Precaution Booklet Issued: No Precaution Comments: Verbally reviewed knee precautions. Restrictions Weight Bearing Restrictions: Yes RLE Weight Bearing: Weight bearing as tolerated      Mobility Bed Mobility               General bed mobility comments: OOB upon entry    Transfers                          Balance Overall balance assessment: Needs assistance Sitting-balance support: No upper extremity supported, Feet supported Sitting balance-Leahy Scale: Good     Standing balance support: Bilateral upper extremity supported, During functional  activity Standing balance-Leahy Scale: Fair Standing balance comment: B UE support on RW                           ADL either performed or assessed with clinical judgement   ADL Overall ADL's : Modified independent                                       General ADL Comments: pt demonstrates ability to complete LB dressing, bathing, toileting and transfers (simulated shower) with modified independence. Reviewed compensatory techniques and recommendations.     Vision   Vision Assessment?: No apparent visual deficits     Perception     Praxis      Pertinent Vitals/Pain Pain Assessment Pain Assessment: 0-10 Pain Score: 5  Pain Location: R knee Pain Descriptors / Indicators: Guarding, Grimacing Pain Intervention(s): Limited activity within patient's tolerance, Monitored during session, Repositioned     Hand Dominance Right   Extremity/Trunk Assessment Upper Extremity Assessment Upper Extremity Assessment: Overall WFL for tasks assessed   Lower Extremity Assessment Lower Extremity Assessment: Defer to PT evaluation RLE Deficits / Details: s/p R TKA   Cervical / Trunk Assessment Cervical / Trunk Assessment: Normal   Communication Communication Communication: No difficulties   Cognition Arousal/Alertness: Awake/alert Behavior During Therapy: WFL for tasks assessed/performed Overall Cognitive Status: Within Functional Limits for tasks assessed  General Comments       Exercises     Shoulder Instructions      Home Living Family/patient expects to be discharged to:: Private residence Living Arrangements: Spouse/significant other Available Help at Discharge: Family Type of Home: House Home Access: Level entry     Home Layout: One level     Bathroom Shower/Tub: Hospital doctor Toilet: Handicapped height     [place]: Conservation officer, nature (2 wheels);Grab bars -  tub/shower;Other (comment);Shower seat (CPM)          Prior Functioning/Environment Prior Level of Function : Independent/Modified Independent                        OT Problem List: Decreased activity tolerance;Impaired balance (sitting and/or standing);Decreased knowledge of use of DME or AE;Decreased knowledge of precautions;Pain      OT Treatment/Interventions:      OT Goals(Current goals can be found in the care plan section) Acute Rehab OT Goals Patient Stated Goal: home OT Goal Formulation: With patient  OT Frequency:      Co-evaluation              AM-PAC OT "6 Clicks" Daily Activity     Outcome Measure Help from another person eating meals?: None Help from another person taking care of personal grooming?: None Help from another person toileting, which includes using toliet, bedpan, or urinal?: None Help from another person bathing (including washing, rinsing, drying)?: None Help from another person to put on and taking off regular upper body clothing?: None Help from another person to put on and taking off regular lower body clothing?: None 6 Click Score: 24   End of Session Equipment Utilized During Treatment: Rolling walker (2 wheels) Nurse Communication: Mobility status  Activity Tolerance: Patient tolerated treatment well Patient left: in chair;with call bell/phone within reach;with family/visitor present  OT Visit Diagnosis: Unsteadiness on feet (R26.81);Pain Pain - Right/Left: Right Pain - part of body: Knee                Time: 8657-8469 OT Time Calculation (min): 10 min Charges:  OT General Charges $OT Visit: 1 Visit OT Evaluation $OT Eval Low Complexity: [name], OT Acute Rehabilitation Services Office [phone]   [name] 01/19/2022, 8:49 AM

## 2022-01-19 NOTE — Progress Notes (Addendum)
Physical Therapy Treatment and Discharge  Patient Details Name: Erik Salazar MRN: [id number] DOB: 01-25-1951 Today's Date: 01/19/2022   History of Present Illness Pt is a 71 y/o male s/p R TKA. PMH includes CVA, s/p ACDF, seizures, fibromyalgia, and s/p L TKA.    PT Comments    Pt demonstrated good recall of HEP and precautions, and was modI for all mobility including ambulating 533f. Educated on mobility and exercise progression once home. Pt and wife had no further questions or concerns at this time. Pt has met all functional goals for acute PT. Pt is discharged from acute PT due to level of independence and completed education.    Recommendations for follow up therapy are one component of a multi-disciplinary discharge planning process, led by the attending physician.  Recommendations may be updated based on patient status, additional functional criteria and insurance authorization.  Follow Up Recommendations  Follow physician's recommendations for discharge plan and follow up therapies     Assistance Recommended at Discharge PRN  Patient can return home with the following Assist for transportation;Assistance with cooking/housework   Equipment Recommendations  None recommended by PT    Recommendations for Other Services       Precautions / Restrictions Precautions Precautions: Knee Precaution Booklet Issued: No Precaution Comments: Verbally reviewed knee precautions. Restrictions Weight Bearing Restrictions: Yes RLE Weight Bearing: Weight bearing as tolerated     Mobility  Bed Mobility               General bed mobility comments: Pt sitting in recliner upon arrival    Transfers Overall transfer level: Modified independent Equipment used: Rolling walker (2 wheels) Transfers: Sit to/from Stand             General transfer comment: STS to and from recliner, demonstrated safe technique    Ambulation/Gait Ambulation/Gait assistance: Modified independent  (Device/Increase time) Gait Distance (Feet): 560 Feet Assistive device: Rolling walker (2 wheels), Straight cane Gait Pattern/deviations: Step-through pattern, Decreased stance time - right Gait velocity: normal Gait velocity interpretation: >2.62 ft/sec, indicative of community ambulatory   General Gait Details: demonstrated good heel strike, and stride length, slight increased reliance on RW during R stance phase, trialed single point cane for 1031f pt demonstrated good balance but states he is more comfortable with RW at this time, educated on proper AD progression   Stairs             Wheelchair Mobility    Modified Rankin (Stroke Patients Only)       Balance Overall balance assessment: Modified Independent                                          Cognition Arousal/Alertness: Awake/alert Behavior During Therapy: WFL for tasks assessed/performed Overall Cognitive Status: Within Functional Limits for tasks assessed                                          Exercises Total Joint Exercises Ankle Circles/Pumps: 10 reps Quad Sets: 10 reps Heel Slides: 20 reps    General Comments        Pertinent Vitals/Pain Pain Assessment Pain Assessment: 0-10 Pain Score: 5  Pain Location: R knee Pain Descriptors / Indicators: Discomfort, Tightness Pain Intervention(s): Monitored during session, Premedicated before session  Home Living Family/patient expects to be discharged to:: Private residence Living Arrangements: Spouse/significant other Available Help at Discharge: Family Type of Home: House Home Access: Level entry       [place]: One [place]: Conservation officer, nature (2 wheels);Grab bars - tub/shower;Other (comment);Shower seat (CPM)      Prior Function            PT Goals (current goals can now be found in the care plan section) Acute Rehab PT Goals Patient Stated Goal: to go home PT Goal Formulation: All  assessment and education complete, DC therapy Progress towards PT goals: Goals met/education completed, patient discharged from PT    Frequency    7X/week      PT Plan Current plan remains appropriate    Co-evaluation              AM-PAC PT "6 Clicks" Mobility   Outcome Measure  Help needed turning from your back to your side while in a flat bed without using bedrails?: None Help needed moving from lying on your back to sitting on the side of a flat bed without using bedrails?: None Help needed moving to and from a bed to a chair (including a wheelchair)?: None Help needed standing up from a chair using your arms (e.g., wheelchair or bedside chair)?: None Help needed to walk in hospital room?: None Help needed climbing 3-5 steps with a railing? : A Little 6 Click Score: 23    End of Session Equipment Utilized During Treatment: Gait belt Activity Tolerance: Patient tolerated treatment well Patient left: in chair;with call bell/phone within reach;with family/visitor present Nurse Communication: Mobility status PT Visit Diagnosis: Other abnormalities of gait and mobility (R26.89);Pain Pain - Right/Left: Right Pain - part of body: Knee     Time: 9847-3085 PT Time Calculation (min) (ACUTE ONLY): 19 min  Charges:  $Gait Training: 8-22 mins                    Mackie Pai, SPT Acute Rehabilitation Services  Office: [phone]    Mackie Pai 01/19/2022, 11:51 AM

## 2022-01-19 NOTE — Progress Notes (Signed)
Subjective: 1 Day Post-Op Procedure(s) (LRB): RIGHT TOTAL KNEE ARTHROPLASTY (Right) Patient reports pain as mild.  Doing well and feeling well. Tells me this am he has a hx LLE dvt  Objective: Vital signs in last 24 hours: Temp:  [97.6 F (36.4 C)-98.2 F (36.8 C)] 98.2 F (36.8 C) (06/02 0720) Pulse Rate:  [59-91] 91 (06/02 0720) Resp:  [10-20] 17 (06/02 0720) BP: (118-157)/(51-85) 126/56 (06/02 0720) SpO2:  [93 %-100 %] 94 % (06/02 0720)  Intake/Output from previous day: 06/01 0701 - 06/02 0700 In: 1900 [I.V.:1700; IV Piggyback:200] Out: 800 [Urine:800] Intake/Output this shift: No intake/output data recorded.  Recent Labs    01/19/22 0622  HGB 13.4   Recent Labs    01/19/22 0622  WBC 12.1*  RBC 4.74  HCT 40.5  PLT 241   No results for input(s): NA, K, CL, CO2, BUN, CREATININE, GLUCOSE, CALCIUM in the last 72 hours. No results for input(s): LABPT, INR in the last 72 hours.  Neurologically intact Neurovascular intact Sensation intact distally Intact pulses distally Dorsiflexion/Plantar flexion intact Incision: dressing C/D/I No cellulitis present Compartment soft   Assessment/Plan: 1 Day Post-Op Procedure(s) (LRB): RIGHT TOTAL KNEE ARTHROPLASTY (Right) Advance diet Up with therapy D/C IV fluids Discharge home with home health after first or second PT session (depending on progression) WBAT RLE Switched post-op asa for xarelto d/t hx dvt   Anticipated LOS equal to or greater than 2 midnights due to - Age 71 and older with one or more of the following:  - Obesity  - Expected need for hospital services (PT, OT, Nursing) required for safe  discharge  - Anticipated need for postoperative skilled nursing care or inpatient rehab  - Active co-morbidities: Stroke and DVT/VTE OR   - Unanticipated findings during/Post Surgery: None  - Patient is a high risk of re-admission due to: None   Erik Salazar 01/19/2022, 8:13 AM

## 2022-01-23 ENCOUNTER — Encounter: Payer: Medicare HMO | Admitting: Physical Therapy

## 2022-01-24 ENCOUNTER — Other Ambulatory Visit: Payer: Self-pay | Admitting: Cardiothoracic Surgery

## 2022-01-24 DIAGNOSIS — I712 Thoracic aortic aneurysm, without rupture, unspecified: Secondary | ICD-10-CM

## 2022-01-26 ENCOUNTER — Ambulatory Visit: Payer: Medicare HMO | Admitting: Physical Therapy

## 2022-01-28 ENCOUNTER — Encounter: Payer: Self-pay | Admitting: Orthopaedic Surgery

## 2022-01-29 ENCOUNTER — Encounter: Payer: Self-pay | Admitting: Orthopaedic Surgery

## 2022-01-29 ENCOUNTER — Other Ambulatory Visit: Payer: Self-pay | Admitting: Physician Assistant

## 2022-01-29 ENCOUNTER — Other Ambulatory Visit (HOSPITAL_BASED_OUTPATIENT_CLINIC_OR_DEPARTMENT_OTHER): Payer: Self-pay

## 2022-01-29 ENCOUNTER — Encounter: Payer: Self-pay | Admitting: Neurology

## 2022-01-29 ENCOUNTER — Ambulatory Visit (INDEPENDENT_AMBULATORY_CARE_PROVIDER_SITE_OTHER): Payer: Medicare HMO | Admitting: Neurology

## 2022-01-29 VITALS — BP 132/79 | HR 91 | Ht 70.0 in | Wt 202.0 lb

## 2022-01-29 DIAGNOSIS — I639 Cerebral infarction, unspecified: Secondary | ICD-10-CM

## 2022-01-29 DIAGNOSIS — R569 Unspecified convulsions: Secondary | ICD-10-CM | POA: Diagnosis not present

## 2022-01-29 DIAGNOSIS — R269 Unspecified abnormalities of gait and mobility: Secondary | ICD-10-CM | POA: Diagnosis not present

## 2022-01-29 MED ORDER — HYDROCODONE-ACETAMINOPHEN 7.5-325 MG PO TABS
1.0000 | ORAL_TABLET | Freq: Four times a day (QID) | ORAL | 0 refills | Status: DC | PRN
  Filled 2022-01-29: qty 40, 5d supply, fill #0

## 2022-01-29 MED ORDER — TRAMADOL HCL 50 MG PO TABS
50.0000 mg | ORAL_TABLET | Freq: Three times a day (TID) | ORAL | 2 refills | Status: DC | PRN
  Filled 2022-01-29: qty 30, 10d supply, fill #0
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
## 2022-01-29 NOTE — Telephone Encounter (Signed)
I have sent in refill on norco and also sent in the tramadol.  I would suggest taking the robaxin as well.  Ice and elevate.  If he feels like he is doing too much with and without PT, it may be best to rest for a few days

## 2022-01-29 NOTE — Telephone Encounter (Signed)
I refilled norco.  I would try taking a melatonin at night.

## 2022-01-29 NOTE — Telephone Encounter (Signed)
Sent in tramadol to alternate with norco in hopes of helping more with pain control

## 2022-01-30 ENCOUNTER — Encounter: Payer: Self-pay | Admitting: Neurology

## 2022-01-30 ENCOUNTER — Encounter: Payer: Self-pay | Admitting: Orthopaedic Surgery

## 2022-01-30 NOTE — Telephone Encounter (Signed)
I would take 1-2 norco every 6 hours as needed for moderate/severe pain.  If still having bad pain, can take a tramadol in between norco doses

## 2022-01-31 ENCOUNTER — Ambulatory Visit (INDEPENDENT_AMBULATORY_CARE_PROVIDER_SITE_OTHER): Payer: Medicare HMO | Admitting: Pharmacist

## 2022-01-31 ENCOUNTER — Other Ambulatory Visit (HOSPITAL_BASED_OUTPATIENT_CLINIC_OR_DEPARTMENT_OTHER): Payer: Self-pay

## 2022-01-31 ENCOUNTER — Telehealth: Payer: Self-pay | Admitting: Orthopaedic Surgery

## 2022-01-31 DIAGNOSIS — I2510 Atherosclerotic heart disease of native coronary artery without angina pectoris: Secondary | ICD-10-CM

## 2022-01-31 DIAGNOSIS — E782 Mixed hyperlipidemia: Secondary | ICD-10-CM

## 2022-01-31 NOTE — Progress Notes (Unsigned)
Patient ID: Erik Salazar                 DOB: 12-22-1950                    MRN: [id number]     HPI: Erik Salazar is a 71 y.o. male patient referred to lipid clinic by Dr. Marlou Porch. PMH is significant for coronary artery disease, CABG in 2017, ischemic stroke, HTN, graft abscess, aortic atherosclerosis, and AVR/Bentall. Pt has a complicated history of post-CABG abscess that required redo-sternotomy for I/D at [facility]. The stay was complicated by CVA. Pt will present next month for follow-up cardiac CTA. LVEF 12/08/20 was 55-60% with mild LVH. Pt was last seen by Dr. Marlou Porch on 01/04/22 for follow-up and surgical clearance for a right knee partial lateral meniscectomy. No changes were made at that time and pt was given surgical clearance. Pt's maximal tolerated statin is pravastatin 40 mg daily. Pt has not tolerated other statins or Zetia due to leg cramps. Given extensive cardiac history, pt was referred to lipid clinic for consideration of a PCSK9 inhibitor.  Pt presents to clinic for lipid management and cardiovascular risk reduction. 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 Pt reported trying multiple statins over the last 20 years, but does not recall the names. Pt reports tolerating pravastatin 40 mg daily well, however all previous statins have caused significant myopathy. Pt reports healthy lifestyle interventions including diet and exercise. Pt endorses feeling well today and recovering nicely after knee surgery and is eager to start back at the gym. Pt denies chest pain today.  Current Medications: pravastatin 40 mg daily Intolerances: Multiple statins, Zetia 10 mg daily (leg cramps) Risk Factors: progressive ASCVD, CVA, CABG LDL goal: <55 mg/dL  Diet: Eats special K no sugar cereal every morning with black coffee and vanilla yogurt, eats mostly chicken and pork for protein, but occasionally substituted for plant based protein 1-2 times weekly, avoids cold cuts,  and reduced pasta intake to once every 3 weeks.  Exercise: Pt reports prior to knee surgery 14 days ago, went to the gym every day for weight training and ran a 5k on treadmill  Family History: Aneurysms of mother and father; Arthritis in his father and mother.   Social History: Non smoker , avoids alcohol  Labs: 11/06/19 Lipid panel on pravastatin 40 mg daily: TC 200, HDL 47, LDL 122, VLDL 31, and TG 177  Past Medical History:  Diagnosis Date   Arthritis    RA and OA   Carpal tunnel syndrome on right 07/08/2017   Cervical radiculopathy at C6 08/28/3233   Right   Complication of anesthesia    Coronary artery disease    Enlarged prostate    Fibromyalgia    H/O: knee surgery    15    History of blood clots    History of open heart surgery    ASCENDING AORTIC ANEURYSM   Ischemic stroke of frontal lobe ([place]) 03/14/2017   Bilateral; post-redo CT surgery   Pneumonia    PONV (postoperative nausea and vomiting)    only after CABG surgeries   Seizure disorder ([place]) 03/14/2017   Seizures ([place])    Status post knee surgery    DVT POST KNEE SURGERY   Stroke [facility])     Current Outpatient Medications on File Prior to Visit  Medication Sig Dispense Refill   ascorbic acid (VITAMIN C) 500 MG tablet Take 500 mg by  mouth daily.     Cholecalciferol (VITAMIN D3) 50 MCG (2000 UT) TABS Take 2,000 Units by mouth daily.     COVID-19 mRNA bivalent vaccine, Pfizer, (PFIZER COVID-19 VAC BIVALENT) injection Inject into the muscle. 0.3 mL 0   docusate sodium (COLACE) 100 MG capsule Take 1 capsule (100 mg total) by mouth daily as needed. 100 capsule 2   HYDROcodone-acetaminophen (NORCO) 7.5-325 MG tablet Take 1-2 tablets by mouth every 6 (six) hours as needed for moderate pain. To be taken after surgery 40 tablet 0   levETIRAcetam (KEPPRA) 1000 MG tablet Take 1 tablet (1,000 mg total) by mouth 2 (two) times daily. 180 tablet 4   methocarbamol (ROBAXIN-750) 750 MG tablet Take 1 tablet (750 mg total) by  mouth 2 (two) times daily as needed for muscle spasms. 20 tablet 2   metoprolol succinate (TOPROL-XL) 25 MG 24 hr tablet Take 1/2 tablet (12.5 mg total) by mouth daily. With or immediately following a meal. 45 tablet 0   ondansetron (ZOFRAN) 4 MG tablet Take 1 tablet (4 mg total) by mouth every 8 (eight) hours as needed for nausea or vomiting. 40 tablet 0   pravastatin (PRAVACHOL) 40 MG tablet Take 1 tablet (40 mg total) by mouth every evening. 90 tablet 3   pyridOXINE (VITAMIN B-6) 100 MG tablet Take 100 mg by mouth daily.     rivaroxaban (XARELTO) 10 MG TABS tablet Take 1 tablet (10 mg total) by mouth daily. 42 tablet 0   sildenafil (REVATIO) 20 MG tablet Take 2 -3 tablets by mouth once daily as needed 30 tablet 1   tamsulosin (FLOMAX) 0.4 MG CAPS capsule Take 2 capsules by mouth everyday 180 capsule 1   traMADol (ULTRAM) 50 MG tablet Take 1 tablet (50 mg total) by mouth 3 (three) times daily as needed. 30 tablet 2   No current facility-administered medications on file prior to visit.    Allergies  Allergen Reactions   Oxycodone Hcl Other (See Comments)    DELUSIONAL   Zetia [Ezetimibe] Other (See Comments)    Leg cramps   Augmentin [Amoxicillin-Pot Clavulanate] Nausea Only    Dizzy   Chlorhexidine Rash   Elemental Sulfur Rash    Assessment/Plan:  1. Hyperlipidemia - LDL of 122 remains above goal of <55 given progressive ASCVD on maximally tolerated statin therapy of pravastatin 40 mg daily. Given the need for a >50% reduction in LDL, options for Repatha and Leqvio were presented. Pt is willing to try either medication with  a preference for Leqvio given the infrequency of injections. Will submit benefit check for Leqvio 284 mg day 0, day 90, then every 6 months thereafter. Pt was made aware that there is currently no data for reduction in CV events. If Marion Downer is not affordable, pt is agreeable to starting Repatha 140 mg every 14 days. Pt was educated on administration, storage, and  possible ADEs. Will submit PA. Pt was encouraged regarding healthy lifestyle choices and advised to change flavored yogurt to plain greek yogurt to reduce sugar content and trade pork for plant based protein like beans and lentils. Will follow up via telephone after hearing back from insurance to discuss plans and schedule follow up labs.   Patient seen with Reatha Harps, PharmD

## 2022-01-31 NOTE — Telephone Encounter (Signed)
thanks

## 2022-01-31 NOTE — Telephone Encounter (Signed)
Anderson Malta (PT) from [facility] [facility] discharged pt form home health pt. Pt will start outpatient pt tomorrow. Anderson Malta phone number is [phone].

## 2022-02-01 ENCOUNTER — Encounter: Payer: Self-pay | Admitting: Physical Therapy

## 2022-02-01 ENCOUNTER — Telehealth: Payer: Self-pay | Admitting: Pharmacist

## 2022-02-01 ENCOUNTER — Other Ambulatory Visit (HOSPITAL_BASED_OUTPATIENT_CLINIC_OR_DEPARTMENT_OTHER): Payer: Self-pay

## 2022-02-01 ENCOUNTER — Ambulatory Visit: Payer: Medicare HMO | Attending: Orthopaedic Surgery | Admitting: Physical Therapy

## 2022-02-01 ENCOUNTER — Telehealth: Payer: Self-pay | Admitting: Radiology

## 2022-02-01 ENCOUNTER — Encounter (HOSPITAL_BASED_OUTPATIENT_CLINIC_OR_DEPARTMENT_OTHER): Payer: Self-pay

## 2022-02-01 DIAGNOSIS — M6281 Muscle weakness (generalized): Secondary | ICD-10-CM | POA: Insufficient documentation

## 2022-02-01 DIAGNOSIS — G8929 Other chronic pain: Secondary | ICD-10-CM | POA: Insufficient documentation

## 2022-02-01 DIAGNOSIS — R252 Cramp and spasm: Secondary | ICD-10-CM | POA: Insufficient documentation

## 2022-02-01 DIAGNOSIS — M25661 Stiffness of right knee, not elsewhere classified: Secondary | ICD-10-CM | POA: Insufficient documentation

## 2022-02-01 DIAGNOSIS — R600 Localized edema: Secondary | ICD-10-CM | POA: Diagnosis present

## 2022-02-01 DIAGNOSIS — M25561 Pain in right knee: Secondary | ICD-10-CM | POA: Insufficient documentation

## 2022-02-01 DIAGNOSIS — R262 Difficulty in walking, not elsewhere classified: Secondary | ICD-10-CM | POA: Diagnosis present

## 2022-02-01 DIAGNOSIS — M5442 Lumbago with sciatica, left side: Secondary | ICD-10-CM | POA: Insufficient documentation

## 2022-02-01 MED ORDER — REPATHA SURECLICK 140 MG/ML ~~LOC~~ SOAJ
140.0000 mg | SUBCUTANEOUS | 3 refills | Status: DC
  Filled 2022-02-01: qty 2, 28d supply, fill #0

## 2022-02-01 NOTE — Therapy (Signed)
OUTPATIENT PHYSICAL THERAPY LOWER EXTREMITY EVALUATION   Patient Name: Erik Salazar MRN: [id number] DOB:Feb 24, 1951, 71 y.o., male Today's Date: 02/01/2022   PT End of Session - 02/01/22 1100     Visit Number 1    Number of Visits 12    Date for PT Re-Evaluation 03/15/22    Authorization Type [employer] Medicare    PT Start Time 1100    PT Stop Time 1140    PT Time Calculation (min) 40 min             Past Medical History:  Diagnosis Date   Arthritis    RA and OA   Carpal tunnel syndrome on right 07/08/2017   Cervical radiculopathy at C6 11/91/4782   Right   Complication of anesthesia    Coronary artery disease    Enlarged prostate    Fibromyalgia    H/O: knee surgery    15    History of blood clots    History of open heart surgery    ASCENDING AORTIC ANEURYSM   Ischemic stroke of frontal lobe ([place]) 03/14/2017   Bilateral; post-redo CT surgery   Pneumonia    PONV (postoperative nausea and vomiting)    only after CABG surgeries   Seizure disorder ([place]) 03/14/2017   Seizures ([place])    Status post knee surgery    DVT POST KNEE SURGERY   Stroke [facility])    Past Surgical History:  Procedure Laterality Date   ANTERIOR CERVICAL DECOMP/DISCECTOMY FUSION N/A 05/04/2020   Procedure: Anterior Cervical Decompression/Discectomy Fuion Cervical three-four, Cervical four-five, Cervical five-six;  Surgeon: Kary Kos, MD;  Location: [place];  Service: Neurosurgery;  Laterality: N/A;   BALLOON DILATION N/A 06/23/2019   Procedure: BALLOON DILATION;  Surgeon: Otis Brace, MD;  [facility];  Service: Gastroenterology;  Laterality: N/A;   BENTALL PROCEDURE  01/04/2016   Bentall with 23 mm pericardial AVR; SVG-LAD, SVG-CX ([place])   BICEPS TENDON REPAIR Right    BIOPSY  06/23/2019   Procedure: BIOPSY;  Surgeon: Otis Brace, MD;  [facility];  Service: Gastroenterology;;   CARDIAC SURGERY     ANUERSYM MAY 2017   Bentall procedure.  Bioprosthetic aortic valve #23 mm bovine model #2700 TF ask, and 28 mm Gelweave woven vascular sinus of Valsalva graft   CARPAL TUNNEL RELEASE  08/2018   right hand    CORONARY ARTERY BYPASS GRAFT  01/04/2016   VG to LAD & VG to LCX   CORONARY ARTERY BYPASS GRAFT  03/13/2017   LIMA to LAD with steril abcess removal from dacron graft   ESOPHAGOGASTRODUODENOSCOPY     Had done dilatation done about 2 or 3 times before in [place]   ESOPHAGOGASTRODUODENOSCOPY (EGD) WITH PROPOFOL N/A 06/23/2019   Procedure: ESOPHAGOGASTRODUODENOSCOPY (EGD) WITH PROPOFOL;  Surgeon: Otis Brace, MD;  [facility];  Service: Gastroenterology;  Laterality: N/A;   FALSE ANEURYSM REPAIR  03/13/2017   redo sternotomy, sterile abscess removal from Dacron graft, omental flap around aorta, CABG: LIMA-LAD ([name], Dr. Mart Piggs)   [place]  2018   Of pericardium 2018   INSERTION OF MESH  10/14/2020   Procedure: INSERTION OF MESH;  Surgeon: Michael Boston, MD;  Location: [place];  Service: General;;   knee surgeries     13 surgeries on knee done before knee replacement    [place] N/A 05/19/2021   Procedure: LYSIS OF ADHESIONS;  Surgeon: Michael Boston, MD;  Location: [place];  Service: General;  Laterality: N/A;  GEN & LOCAL   LYSIS OF ADHESION N/A 10/14/2020   Procedure: LYSIS OF ADHESION;  Surgeon: Michael Boston, MD;  Location: [place];  Service: General;  Laterality: N/A;   open heart surgery     03-13-2017   REPLACEMENT TOTAL KNEE Left 2015   ROTATOR CUFF REPAIR Right    done with biceps tendon repair   TONSILLECTOMY     removed as a child.   TOTAL KNEE ARTHROPLASTY Right 01/18/2022   Procedure: RIGHT TOTAL KNEE ARTHROPLASTY;  Surgeon: Leandrew Koyanagi, MD;  Location: [place];  Service: Orthopedics;  Laterality: Right;   VENTRAL HERNIA REPAIR N/A 10/14/2020   Procedure: LAPAROSCOPIC VENTRAL HERNIA REPAIR WITH TAP BLOCK BILATERAL;  Surgeon: Michael Boston, MD;  Location: [place];  Service: General;  Laterality: N/A;   VENTRAL HERNIA REPAIR N/A 05/19/2021   Procedure: LAPAROSCOPIC VENTRAL WALL HERNIA REPAIR;  Surgeon: Michael Boston, MD;  Location: [place];  Service: General;  Laterality: N/A;   Patient Active Problem List   Diagnosis Date Noted   Gait abnormality 01/29/2022   Status post total right knee replacement 01/18/2022   Coronary artery disease involving native coronary artery of native heart without angina pectoris 01/04/2022   Preop cardiovascular exam 01/04/2022   Primary osteoarthritis of right knee 01/04/2022   Acute right-sided low back pain without sciatica 12/25/2021   Aortic atherosclerosis ([place]) 09/04/2021   Spondylolisthesis at L4-L5 level 06/28/2021   S/P repair of ventral hernia 05/19/2021   S/P repair of recurrent ventral hernia 05/19/2021   Benign prostatic hyperplasia without lower urinary tract symptoms 10/14/2020   Chronic pain 10/14/2020   ED (erectile dysfunction) of organic origin 10/14/2020   Esophageal dysphagia 10/14/2020   Gastroesophageal reflux disease 10/14/2020   History of aortic valve replacement with bioprosthetic valve 10/14/2020   Hx of aortic aneurysm repair 10/14/2020   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   Thoracic aortic aneurysm without rupture ([place]) 10/14/2020   Recurrent incisional hernia 10/14/2020   Spinal stenosis in cervical region 05/04/2020   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   Cervical radiculopathy at C6 11/14/2017   Carpal tunnel syndrome on right 07/08/2017   Tremor, essential 07/08/2017   Ischemic stroke of frontal lobe ([place]) 04/05/2017   Pain in right hand 04/05/2017   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 03/27/2017   Seizure ([place]) 03/14/2017   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/13/2017   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 Reports knee surgery went well, took Xarelto to prevent any DVT, reports no pain in R calf.  Had some difficulty due to having knee surgery while dealing with his back pain.  Having trouble sleeping due to pain.  Felt the CPM machine made his knee stiffer.  Discharged from [facility] yesterday.  Said he was at 108 deg flexion.    PERTINENT HISTORY: History chronic back pain, multiple back surgeries, L  knee replacement,  DVT, CVA frontal lobe with memory deficits, seizures, open heart surgery to repair aneurism, CABG x 2 , ACDF, hernia repair,omental flap graft to mediastinum.  PAIN:  Are you having pain? Yes: NPRS scale: 5-7/10 Pain location: R knee Pain description: constant ache, itchy Aggravating factors: bending Relieving factors: elevation, ice  PRECAUTIONS: None  WEIGHT BEARING RESTRICTIONS No  FALLS:  Has patient fallen in last 6 months? No  LIVING ENVIRONMENT: Lives with: lives with their spouse Lives in: House/apartment Stairs: No Has following equipment at home: Single point cane, Environmental consultant - 2 wheeled, and bed side commode  OCCUPATION: retired, exercises frequently  PLOF: [place] get back to normal, do 5k every morning on treadmill   OBJECTIVE:   DIAGNOSTIC FINDINGS: DG R knee 01/18/2022 FINDINGS: Right femoral and tibial components are well situated. Expected postoperative changes are seen in the soft tissues anteriorly.  PATIENT SURVEYS:  LEFS 40/80= 50% disability   COGNITION:  Overall cognitive  status: Within functional limits for tasks assessed     SENSATION: WFL  EDEMA:  Circumferential: midcalf R 39.0 cm, L 36.5 cm   POSTURE: decreased lumbar lordosis  PALPATION: Noted increased edema R calf, but no tenderness with palpation/squeeze test, n4o s/s of DVT.  Incision covered with bandage still.  No s/s of infection.  Bruising from nerve block R thigh.    LOWER EXTREMITY ROM:  Active ROM Right eval Left eval  Knee flexion 100 sitting 110 supine (passive) 122 sitting 130 supine  Knee extension 10 0   (Blank rows = not tested)  LOWER EXTREMITY MMT:  MMT Right eval Left eval  Hip flexion 3+ 5  Hip extension 4+ 4  Hip abduction 5 5  Hip adduction 5 5  Knee flexion 4+ 5  Knee extension 2+ quad lag 12 deg 5  Ankle dorsiflexion 5 5   (Blank rows = not tested)  HAMSTRING: measured through popliteal angle lacking 50 deg R, 20 deg L.   FUNCTIONAL TESTS:  5 times sit to stand: 13.5 sec with UE assist  GAIT: Distance walked: 47' Assistive device utilized: Single point cane Level of assistance: Modified independence Comments: antalgic, decreased stance time, heel strike on R, decreased gait speed 0.65 m/s, wide BOS    TODAY'S TREATMENT: 02/01/2022 Self Care - review of current HEP, findings, POC.   PATIENT EDUCATION:  Education details: see treatment Person educated: Patient Education method: Explanation Education comprehension: verbalized understanding   HOME EXERCISE PROGRAM: Continue HEP from [place], will advance next session.  ASSESSMENT:  CLINICAL IMPRESSION: Patient is a 71 y.o. male who was seen today for physical therapy evaluation and treatment for s/p R TKA on 01/18/2022.  He demonstrates R knee ROM 10-110 deg, quad lag, decreased R hip and knee strength, impaired gait and R knee pain.  He also has impaired core strength due to history of abdominal surgery and may have new hernia (reports popping and outpouching near previous surgery at midline),  and was not able to tolerate prone positioning or exercise in prone due to this.  He reports continued low back pain also limiting progress.  He would benefit from skilled physical therapy to improve strength, R knee ROM, decrease pain and allow return to regular exercise program for health.   OBJECTIVE IMPAIRMENTS Abnormal gait, decreased endurance, decreased mobility, difficulty walking, decreased ROM, decreased strength, increased edema, increased fascial restrictions, increased muscle spasms, impaired flexibility, and pain.   ACTIVITY LIMITATIONS carrying, lifting, bending, sitting, standing, sleeping, stairs, transfers, and  locomotion level  PARTICIPATION LIMITATIONS: driving, community activity, and yard work  PERSONAL FACTORS Past/current experiences, Transportation, and 3+ comorbidities: History chronic back pain, multiple back surgeries, L  knee replacement, DVT, CVA frontal lobe with memory deficits, seizures, open heart surgery to repair aneurism, CABG x 2 , ACDF, hernia repair,omental flap graft to mediastinum.  are also affecting patient's functional outcome.   REHAB POTENTIAL: Good  CLINICAL DECISION MAKING: Evolving/moderate complexity  EVALUATION COMPLEXITY: Moderate   GOALS: Goals reviewed with patient? Yes   SHORT TERM GOALS: Target date: 02/15/2022    Independent with initial HEP. Baseline: compliant with HEP from [facility], able to verbalize and demonstrate exercises correctly, performs 3x/day.  Goal status: MET  2.  Pt. Will report 75% improvement in R knee pain Baseline: constant 5-7/10 Goal status: INITIAL   LONG TERM GOALS: Target date: 03/15/2022   Independent with advanced/ongoing HEP to improve outcomes and carryover.  Baseline: needs advancement Goal status: INITIAL  2.  Earsel Swavely will demonstrate R knee flexion to 120 deg to ascend/descend stairs. Baseline: R knee flexion 110 passive, 100 active Goal status: INITIAL  3.  Radek Campi will  demonstrate full R knee extension for safety with gait. Baseline: lacking 10 deg  Goal status: INITIAL  4.  Kupono Groome will be able to ambulate 600' safely with LRAD and normal gait pattern to access community.  Baseline: using SPC, decreased gait speed.  Goal status: INITIAL  5.  Shellie Calmes will be able to ascend/descend stairs with 1 HR and reciprocal step pattern safely to access home and community.  Baseline: unable Goal status: INITIAL  6.  Makana Lowden will demonstrate > 19/24 on DGI to demonstrate decreased risk of falls.   Baseline: NT today Goal status: INITIAL  7.  Quinzell Borkowski will demonstrate improved functional LE strength by completing 5x STS in <13 seconds without UE assist Baseline: 13.5 sec with UE assist.  Cannot stand without UE assist.  Goal status: INITIAL  8.  Isador Campise will demonstrate <40% disability on LEFS to demonstrate improved mobility.  Baseline: 40/80 = 50% disability Goal status: INITIAL  9.  Koda Cubillos will be able to return to walking 5K daily without limitation from R knee pain or LBP.  Baseline: unable Goal status: INITIAL    PLAN: PT FREQUENCY: 2x/week  PT DURATION: 6 weeks  PLANNED INTERVENTIONS: Therapeutic exercises, Therapeutic activity, Neuromuscular re-education, Balance training, Gait training, Patient/Family education, Joint mobilization, Stair training, Dry Needling, Cryotherapy, Moist heat, scar mobilization, Vasopneumatic device, Ultrasound, and Manual therapy  PLAN FOR NEXT SESSION: progress LE strengthening and ROM as tolerated.    Rennie Natter, PT, DPT  02/01/2022, 11:54 AM

## 2022-02-01 NOTE — Telephone Encounter (Signed)
Pt called and informed Leqvio and Repatha have been approved> Leqvio is no cost. Will send in script for Repatha to pharmacy to check cost. Pt is still considering which medication he would like to choose, so he was instructed not to pick up [place] today and we will call next week to either schedule Leqvio or initiate Repatha.

## 2022-02-01 NOTE — Telephone Encounter (Signed)
Patient  had surgery two weeks ago.  Coming to see me for post-op appointment tomorrow where we will start  him in oppt.  If this is where he is wanting to go to PT, can you put in order?

## 2022-02-01 NOTE — Telephone Encounter (Signed)
PA done via telephone for Leqvio. Clinical documents sent over via fax.   If PA approved, then it would be covered at 100%

## 2022-02-02 ENCOUNTER — Telehealth: Payer: Self-pay

## 2022-02-02 ENCOUNTER — Ambulatory Visit (INDEPENDENT_AMBULATORY_CARE_PROVIDER_SITE_OTHER): Payer: Medicare HMO | Admitting: Physician Assistant

## 2022-02-02 ENCOUNTER — Other Ambulatory Visit (HOSPITAL_BASED_OUTPATIENT_CLINIC_OR_DEPARTMENT_OTHER): Payer: Self-pay

## 2022-02-02 ENCOUNTER — Other Ambulatory Visit: Payer: Self-pay

## 2022-02-02 DIAGNOSIS — Z96651 Presence of right artificial knee joint: Secondary | ICD-10-CM

## 2022-02-02 MED ORDER — HYDROCODONE-ACETAMINOPHEN 5-325 MG PO TABS
1.0000 | ORAL_TABLET | ORAL | 0 refills | Status: DC | PRN
  Filled 2022-02-02: qty 30, 5d supply, fill #0

## 2022-02-02 NOTE — Telephone Encounter (Signed)
Please see message below

## 2022-02-02 NOTE — Progress Notes (Signed)
Post-Op Visit Note   Patient: Erik Salazar           Date of Birth: 1951-03-19           MRN: [id number] Visit Date: 02/02/2022 PCP: Marrian Salvage, FNP   Assessment & Plan:  Chief Complaint:  Chief Complaint  Patient presents with   Right Knee - Routine Post Op   Visit Diagnoses:  1. History of total knee replacement, right     Plan: Patient is a pleasant 71 year old gentleman who comes in today 2 weeks status post right total knee replacement 01/18/2022.  He has been doing well and in mild to moderate pain which is relieved with Norco.  He is on chronic Xarelto.  He started formal physical therapy yesterday.  Examination of his right knee reveals a well-healed surgical incision with nylon sutures in place.  No evidence of infection or cellulitis.  Calves are soft and nontender.  He is neurovascular intact distally.  Today, sutures were removed and Steri-Strips applied.  He will continue with physical therapy.  Dental prophylaxis reinforced.  I refilled his Norco as he will likely run out over the weekend.  He will follow-up with [place] in 4 weeks time for repeat evaluation and 2 view x-rays of the right knee.  Call with concerns or questions in the meantime.  Follow-Up Instructions: Return in about 4 weeks (around 03/02/2022).   Orders:  No orders of the defined types were placed in this encounter.  Meds ordered this encounter  Medications   HYDROcodone-acetaminophen (NORCO) 5-325 MG tablet    Sig: Take 1 tablet by mouth every 4 (four) hours as needed.    Dispense:  30 tablet    Refill:  0    Imaging: No new imaging  PMFS History: Patient Active Problem List   Diagnosis Date Noted   Gait abnormality 01/29/2022   Status post total right knee replacement 01/18/2022   Coronary artery disease involving native coronary artery of native heart without angina pectoris 01/04/2022   Preop cardiovascular exam 01/04/2022   Primary osteoarthritis of right knee 01/04/2022   Acute  right-sided low back pain without sciatica 12/25/2021   Aortic atherosclerosis ([place]) 09/04/2021   Spondylolisthesis at L4-L5 level 06/28/2021   S/P repair of ventral hernia 05/19/2021   S/P repair of recurrent ventral hernia 05/19/2021   Benign prostatic hyperplasia without lower urinary tract symptoms 10/14/2020   Chronic pain 10/14/2020   ED (erectile dysfunction) of organic origin 10/14/2020   Esophageal dysphagia 10/14/2020   Gastroesophageal reflux disease 10/14/2020   History of aortic valve replacement with bioprosthetic valve 10/14/2020   Hx of aortic aneurysm repair 10/14/2020   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   Thoracic aortic aneurysm without rupture ([place]) 10/14/2020   Recurrent incisional hernia 10/14/2020   Spinal stenosis in cervical region 05/04/2020   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   Cervical radiculopathy at C6 11/14/2017   Carpal tunnel syndrome on right 07/08/2017   Tremor, essential 07/08/2017   Ischemic stroke of frontal lobe ([place]) 04/05/2017   Pain in right hand 04/05/2017   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 03/27/2017   Seizure ([place]) 03/14/2017   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/13/2017   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[place]) 03/12/2017   Essential hypertension 02/26/2017   Mixed hyperlipidemia 02/26/2017   Past Medical History:  Diagnosis Date   Arthritis    RA and OA   Carpal tunnel syndrome on right 07/08/2017   Cervical radiculopathy at C6 11/14/2017  Right   Complication of anesthesia    Coronary artery disease    Enlarged prostate    Fibromyalgia    H/O: knee surgery    15    History of blood clots    History of open heart surgery    ASCENDING AORTIC ANEURYSM   Ischemic stroke of frontal lobe ([place]) 03/14/2017   Bilateral; post-redo CT surgery   Pneumonia    PONV (postoperative nausea and vomiting)    only after CABG surgeries   Seizure disorder ([place]) 03/14/2017   Seizures ([place])    Status post knee surgery    DVT POST  KNEE SURGERY   Stroke [facility])     Family History  Problem Relation Age of Onset   Arthritis Mother    Aneurysm Mother        brain aneurysm for mother.    Arthritis Father    Colon cancer Neg Hx    Esophageal cancer Neg Hx     Past Surgical History:  Procedure Laterality Date   ANTERIOR CERVICAL DECOMP/DISCECTOMY FUSION N/A 05/04/2020   Procedure: Anterior Cervical Decompression/Discectomy Fuion Cervical three-four, Cervical four-five, Cervical five-six;  Surgeon: Kary Kos, MD;  Location: [place];  Service: Neurosurgery;  Laterality: N/A;   BALLOON DILATION N/A 06/23/2019   Procedure: BALLOON DILATION;  Surgeon: Otis Brace, MD;  [facility];  Service: Gastroenterology;  Laterality: N/A;   BENTALL PROCEDURE  01/04/2016   Bentall with 23 mm pericardial AVR; SVG-LAD, SVG-CX ([place])   BICEPS TENDON REPAIR Right    BIOPSY  06/23/2019   Procedure: BIOPSY;  Surgeon: Otis Brace, MD;  [facility];  Service: Gastroenterology;;   CARDIAC SURGERY     ANUERSYM MAY 2017   Bentall procedure. Bioprosthetic aortic valve #23 mm bovine model #2700 TF ask, and 28 mm Gelweave woven vascular sinus of Valsalva graft   CARPAL TUNNEL RELEASE  08/2018   right hand    CORONARY ARTERY BYPASS GRAFT  01/04/2016   VG to LAD & VG to LCX   CORONARY ARTERY BYPASS GRAFT  03/13/2017   LIMA to LAD with steril abcess removal from dacron graft   ESOPHAGOGASTRODUODENOSCOPY     Had done dilatation done about 2 or 3 times before in [place]   ESOPHAGOGASTRODUODENOSCOPY (EGD) WITH PROPOFOL N/A 06/23/2019   Procedure: ESOPHAGOGASTRODUODENOSCOPY (EGD) WITH PROPOFOL;  Surgeon: Otis Brace, MD;  [facility];  Service: Gastroenterology;  Laterality: N/A;   FALSE ANEURYSM REPAIR  03/13/2017   redo sternotomy, sterile abscess removal from Dacron graft, omental flap around aorta, CABG: LIMA-LAD ([name], Dr. Mart Piggs)   [place]  2018   Of  pericardium 2018   INSERTION OF MESH  10/14/2020   Procedure: INSERTION OF MESH;  Surgeon: Michael Boston, MD;  Location: [place];  Service: General;;   knee surgeries     13 surgeries on knee done before knee replacement    [place] N/A 05/19/2021   Procedure: LYSIS OF ADHESIONS;  Surgeon: Michael Boston, MD;  Location: [place];  Service: General;  Laterality: N/A;  GEN & LOCAL   LYSIS OF ADHESION N/A 10/14/2020   Procedure: LYSIS OF ADHESION;  Surgeon: Michael Boston, MD;  Location: [place];  Service: General;  Laterality: N/A;   open heart surgery     03-13-2017   REPLACEMENT TOTAL KNEE Left 2015   ROTATOR CUFF REPAIR Right    done with biceps tendon repair  TONSILLECTOMY     removed as a child.   TOTAL KNEE ARTHROPLASTY Right 01/18/2022   Procedure: RIGHT TOTAL KNEE ARTHROPLASTY;  Surgeon: Leandrew Koyanagi, MD;  Location: [place];  Service: Orthopedics;  Laterality: Right;   VENTRAL HERNIA REPAIR N/A 10/14/2020   Procedure: LAPAROSCOPIC VENTRAL HERNIA REPAIR WITH TAP BLOCK BILATERAL;  Surgeon: Michael Boston, MD;  Location: [place];  Service: General;  Laterality: N/A;   VENTRAL HERNIA REPAIR N/A 05/19/2021   Procedure: LAPAROSCOPIC VENTRAL WALL HERNIA REPAIR;  Surgeon: Michael Boston, MD;  Location: [place];  Service: General;  Laterality: N/A;   Social History   Occupational History   Occupation: RETIRED  Tobacco Use   Smoking status: Former    Types: Cigars    Quit date: 2021    Years since quitting: 2.4   Smokeless tobacco: Never   Tobacco comments:    Only smoked cigars for 6 months  Vaping Use   Vaping Use: Never used  Substance and Sexual Activity   Alcohol use: Yes    Alcohol/week: 1.0 standard drink of alcohol    Types: 1 Cans of beer per week    Comment: occassionally   Drug use: No   Sexual activity: Not on file

## 2022-02-02 NOTE — Telephone Encounter (Signed)
Called pharmacy to inquire about price for Repatha, which is $117.36 for 30 days. Pharmacist noted that some is contributing to deductible, but is unable to say if the deductible was met or not.

## 2022-02-02 NOTE — Telephone Encounter (Signed)
PT office called and states that they saw pt in the office yesterday but are still waiting for a referral. Called yesterday and states that she is following up tpoday. [phone] fax [phone]

## 2022-02-05 ENCOUNTER — Other Ambulatory Visit: Payer: Self-pay | Admitting: Orthopaedic Surgery

## 2022-02-05 NOTE — Addendum Note (Signed)
Addended by: Rennie Natter on: 02/05/2022 03:47 PM   Modules accepted: Orders

## 2022-02-06 ENCOUNTER — Ambulatory Visit: Payer: Medicare HMO

## 2022-02-06 DIAGNOSIS — R252 Cramp and spasm: Secondary | ICD-10-CM

## 2022-02-06 DIAGNOSIS — M5442 Lumbago with sciatica, left side: Secondary | ICD-10-CM | POA: Insufficient documentation

## 2022-02-06 DIAGNOSIS — R600 Localized edema: Secondary | ICD-10-CM | POA: Insufficient documentation

## 2022-02-06 DIAGNOSIS — M6281 Muscle weakness (generalized): Secondary | ICD-10-CM | POA: Insufficient documentation

## 2022-02-06 DIAGNOSIS — M25561 Pain in right knee: Secondary | ICD-10-CM | POA: Insufficient documentation

## 2022-02-06 DIAGNOSIS — R262 Difficulty in walking, not elsewhere classified: Secondary | ICD-10-CM | POA: Diagnosis present

## 2022-02-06 DIAGNOSIS — G8929 Other chronic pain: Secondary | ICD-10-CM | POA: Insufficient documentation

## 2022-02-06 DIAGNOSIS — M25661 Stiffness of right knee, not elsewhere classified: Secondary | ICD-10-CM | POA: Diagnosis present

## 2022-02-06 MED ORDER — LEQVIO 284 MG/1.5ML ~~LOC~~ SOSY: 284.0000 mg | PREFILLED_SYRINGE | Freq: Once | SUBCUTANEOUS | 0 refills | Status: AC

## 2022-02-06 NOTE — Therapy (Signed)
OUTPATIENT PHYSICAL THERAPY TREATMENT   Patient Name: Erik Salazar MRN: [id number] DOB:[birth year], 71 y.o., male Today's Date: 02/06/2022   PT End of Session - 02/06/22 0904     Visit Number 2    Number of Visits 12    Date for PT Re-Evaluation 03/15/22    Authorization Type [employer]    PT Start Time 0800    PT Stop Time 0858    PT Time Calculation (min) 58 min    Activity Tolerance Patient tolerated treatment well    Behavior During Therapy [facility] for tasks assessed/performed              Past Medical History:  Diagnosis Date   Arthritis    RA and OA   Carpal tunnel syndrome on right 07/08/2017   Cervical radiculopathy at C6 64/40/3474   Right   Complication of anesthesia    Coronary artery disease    Enlarged prostate    Fibromyalgia    H/O: knee surgery    15    History of blood clots    History of open heart surgery    ASCENDING AORTIC ANEURYSM   Ischemic stroke of frontal lobe ([place]) 03/14/2017   Bilateral; post-redo CT surgery   Pneumonia    PONV (postoperative nausea and vomiting)    only after CABG surgeries   Seizure disorder ([place]) 03/14/2017   Seizures ([place])    Status post knee surgery    DVT POST KNEE SURGERY   Stroke [facility])    Past Surgical History:  Procedure Laterality Date   ANTERIOR CERVICAL DECOMP/DISCECTOMY FUSION N/A 05/04/2020   Procedure: Anterior Cervical Decompression/Discectomy Fuion Cervical three-four, Cervical four-five, Cervical five-six;  Surgeon: Kary Kos, MD;  Location: [place];  Service: Neurosurgery;  Laterality: N/A;   BALLOON DILATION N/A 06/23/2019   Procedure: BALLOON DILATION;  Surgeon: Otis Brace, MD;  Location: [facility];  Service: Gastroenterology;  Laterality: N/A;   BENTALL PROCEDURE  01/04/2016   Bentall with 23 mm pericardial AVR; SVG-LAD, SVG-CX ([place])   BICEPS TENDON REPAIR Right    BIOPSY  06/23/2019   Procedure: BIOPSY;  Surgeon: Otis Brace, MD;  Location: [facility];  Service: Gastroenterology;;   CARDIAC SURGERY     ANUERSYM MAY 2017   Bentall procedure. Bioprosthetic aortic valve #23 mm bovine model #2700 TF ask, and 28 mm Gelweave woven vascular sinus of Valsalva graft   CARPAL TUNNEL RELEASE  08/2018   right hand    CORONARY ARTERY BYPASS GRAFT  01/04/2016   VG to LAD & VG to LCX   CORONARY ARTERY BYPASS GRAFT  03/13/2017   LIMA to LAD with steril abcess removal from dacron graft   ESOPHAGOGASTRODUODENOSCOPY     Had done dilatation done about 2 or 3 times before in [place]   ESOPHAGOGASTRODUODENOSCOPY (EGD) WITH PROPOFOL N/A 06/23/2019   Procedure: ESOPHAGOGASTRODUODENOSCOPY (EGD) WITH PROPOFOL;  Surgeon: Otis Brace, MD;  [facility];  Service: Gastroenterology;  Laterality: N/A;   FALSE ANEURYSM REPAIR  03/13/2017   redo sternotomy, sterile abscess removal from Dacron graft, omental flap around aorta, CABG: LIMA-LAD ([name], Dr. Mart Piggs)   [place]  2018   Of pericardium 2018   INSERTION OF MESH  10/14/2020   Procedure: INSERTION OF MESH;  Surgeon: Michael Boston, MD;  Location: [place];  Service: General;;   knee surgeries     13 surgeries on knee done before knee replacement    [place]  LAPAROSCOPIC LYSIS OF ADHESIONS N/A 05/19/2021   Procedure: LYSIS OF ADHESIONS;  Surgeon: Michael Boston, MD;  Location: [place];  Service: General;  Laterality: N/A;  GEN & LOCAL   LYSIS OF ADHESION N/A 10/14/2020   Procedure: LYSIS OF ADHESION;  Surgeon: Michael Boston, MD;  Location: [place];  Service: General;  Laterality: N/A;   open heart surgery     03-13-2017   REPLACEMENT TOTAL KNEE Left 2015   ROTATOR CUFF REPAIR Right    done with biceps tendon repair   TONSILLECTOMY     removed as a child.   TOTAL KNEE ARTHROPLASTY Right 01/18/2022   Procedure: RIGHT TOTAL KNEE ARTHROPLASTY;  Surgeon: Leandrew Koyanagi, MD;  Location: [place];  Service: Orthopedics;  Laterality: Right;   VENTRAL HERNIA REPAIR N/A  10/14/2020   Procedure: LAPAROSCOPIC VENTRAL HERNIA REPAIR WITH TAP BLOCK BILATERAL;  Surgeon: Michael Boston, MD;  Location: [place];  Service: General;  Laterality: N/A;   VENTRAL HERNIA REPAIR N/A 05/19/2021   Procedure: LAPAROSCOPIC VENTRAL WALL HERNIA REPAIR;  Surgeon: Michael Boston, MD;  Location: [place];  Service: General;  Laterality: N/A;   Patient Active Problem List   Diagnosis Date Noted   Gait abnormality 01/29/2022   Status post total right knee replacement 01/18/2022   Coronary artery disease involving native coronary artery of native heart without angina pectoris 01/04/2022   Preop cardiovascular exam 01/04/2022   Primary osteoarthritis of right knee 01/04/2022   Acute right-sided low back pain without sciatica 12/25/2021   Aortic atherosclerosis ([place]) 09/04/2021   Spondylolisthesis at L4-L5 level 06/28/2021   S/P repair of ventral hernia 05/19/2021   S/P repair of recurrent ventral hernia 05/19/2021   Benign prostatic hyperplasia without lower urinary tract symptoms 10/14/2020   Chronic pain 10/14/2020   ED (erectile dysfunction) of organic origin 10/14/2020   Esophageal dysphagia 10/14/2020   Gastroesophageal reflux disease 10/14/2020   History of aortic valve replacement with bioprosthetic valve 10/14/2020   Hx of aortic aneurysm repair 10/14/2020   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   Thoracic aortic aneurysm without rupture ([place]) 10/14/2020   Recurrent incisional hernia 10/14/2020   Spinal stenosis in cervical region 05/04/2020   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   Cervical radiculopathy at C6 11/14/2017   Carpal tunnel syndrome on right 07/08/2017   Tremor, essential 07/08/2017   Ischemic stroke of frontal lobe ([place]) 04/05/2017   Pain in right hand 04/05/2017   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 03/27/2017   Seizure ([place]) 03/14/2017   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/13/2017   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  PERTINENT HISTORY: History chronic back pain, multiple back surgeries, L  knee replacement, DVT, CVA frontal lobe with memory deficits, seizures, open heart surgery to repair aneurism, CABG x 2 , ACDF, hernia repair,omental flap graft to mediastinum.  PAIN:  Are you having pain? Yes: NPRS scale: 5/10 Pain location: R knee Pain description:  constant ache, itchy Aggravating factors: bending Relieving factors: elevation, ice  PRECAUTIONS: None  WEIGHT BEARING RESTRICTIONS No  FALLS:  Has patient fallen in last 6 months? No  LIVING ENVIRONMENT: Lives with: lives with their spouse Lives in: House/apartment Stairs: No Has following equipment at home: Single point cane, Environmental consultant - 2 wheeled, and bed side commode  OCCUPATION: retired, exercises frequently  PLOF: [place] get back to normal, do 5k every morning on treadmill   OBJECTIVE:   DIAGNOSTIC FINDINGS: DG R knee 01/18/2022 FINDINGS: Right femoral and tibial components are well situated. Expected postoperative changes are seen in the soft tissues anteriorly.  PATIENT SURVEYS:  LEFS 40/80= 50% disability   COGNITION:  Overall cognitive status: Within functional limits for tasks assessed     SENSATION: WFL  EDEMA:  Circumferential: midcalf R 39.0 cm, L 36.5 cm   POSTURE: decreased lumbar lordosis  PALPATION: Noted increased edema R calf, but no  tenderness with palpation/squeeze test, n4o s/s of DVT.  Incision covered with bandage still.  No s/s of infection.  Bruising from nerve block R thigh.    LOWER EXTREMITY ROM:  Active ROM Right eval Left eval  Knee flexion 100 sitting 110 supine (passive) 122 sitting 130 supine  Knee extension 10 0   (Blank rows = not tested)  LOWER EXTREMITY MMT:  MMT Right eval Left eval  Hip flexion 3+ 5  Hip extension 4+ 4  Hip abduction 5 5  Hip adduction 5 5  Knee flexion 4+ 5  Knee extension 2+ quad lag 12 deg 5  Ankle dorsiflexion 5 5   (Blank rows = not tested)  HAMSTRING: measured through popliteal angle lacking 50 deg R, 20 deg L.   FUNCTIONAL TESTS:  5 times sit to stand: 13.5 sec with UE assist  GAIT: Distance walked: 79' Assistive device utilized: Single point cane Level of assistance: Modified independence Comments: antalgic, decreased stance time, heel strike on R, decreased gait speed 0.65 m/s, wide BOS    TODAY'S TREATMENT: 02/06/22 Therapeutic Exercise: Bike L1x51mn Seated hamstring + gastroc stretch with strap 2x30 sec Seated LAQ 10x3" Standing mini squat 10x Seated R HS curls with red TB 10x Standing R SLS staggered 10x5 sec hold Manual Therapy: PROM to R knee Modailities:  GR to R knee 34 deg, low compression 10 min  02/01/2022 Self Care - review of current HEP, findings, POC.   PATIENT EDUCATION:  Education details: see treatment Person educated: Patient Education method: Explanation Education comprehension: verbalized understanding   HOME EXERCISE PROGRAM: Access Code: [email or web address] IncURL: [email or web address] Date: 02/06/2022 Prepared by: BClarene Essex Exercises - Seated Hamstring Stretch with Strap  - 2 x daily - 7 x weekly - 2 sets - 2 reps - 30 sec hold - Gastroc Stretch on Wall  - 2 x daily - 7 x weekly - 2 sets - 2 reps - 30 sec hold - Seated Long Arc Quad  - 1 x daily - 7 x weekly - 2-3 sets - 10 reps - 3-5 sec  hold - Mini  Squat with Counter Support  - 1 x daily - 7 x weekly - 2-3 sets - 10 reps  ASSESSMENT:  CLINICAL IMPRESSION:  Erik Salazar demonstrated a good response to TE today with cues given to ensure the proper muscles are targeted. He shows most ROM limitation with knee extension. Adjusted to mini squats for at home to decrease LBP. Updated HEP to work on extension but also facilitating musculature of the  knee for stability. Ended session with vaso to address swelling in the knee post exercise.  OBJECTIVE IMPAIRMENTS Abnormal gait, decreased endurance, decreased mobility, difficulty walking, decreased ROM, decreased strength, increased edema, increased fascial restrictions, increased muscle spasms, impaired flexibility, and pain.   ACTIVITY LIMITATIONS carrying, lifting, bending, sitting, standing, sleeping, stairs, transfers, and locomotion level  PARTICIPATION LIMITATIONS: driving, community activity, and yard work  [place] Past/current experiences, Transportation, and 3+ comorbidities: History chronic back pain, multiple back surgeries, L  knee replacement, DVT, CVA frontal lobe with memory deficits, seizures, open heart surgery to repair aneurism, CABG x 2 , ACDF, hernia repair,omental flap graft to mediastinum.  are also affecting patient's functional outcome.   REHAB POTENTIAL: Good  CLINICAL DECISION MAKING: Evolving/moderate complexity  EVALUATION COMPLEXITY: Moderate   GOALS: Goals reviewed with patient? Yes   SHORT TERM GOALS: Target date: 02/15/2022    Independent with initial HEP. Baseline: compliant with HEP from HHPT, able to verbalize and demonstrate exercises correctly, performs 3x/day.  Goal status: MET  2.  Pt. Will report 75% improvement in R knee pain Baseline: constant 5-7/10 Goal status: INITIAL   LONG TERM GOALS: Target date: 03/15/2022   Independent with advanced/ongoing HEP to improve outcomes and carryover.  Baseline: needs advancement Goal status:  INITIAL  2.  Calistro Kvamme will demonstrate R knee flexion to 120 deg to ascend/descend stairs. Baseline: R knee flexion 110 passive, 100 active Goal status: INITIAL  3.  Fitzgerald Landers will demonstrate full R knee extension for safety with gait. Baseline: lacking 10 deg  Goal status: INITIAL  4.  Kaeson Plascencia will be able to ambulate 600' safely with LRAD and normal gait pattern to access community.  Baseline: using SPC, decreased gait speed.  Goal status: INITIAL  5.  Wacey Meinders will be able to ascend/descend stairs with 1 HR and reciprocal step pattern safely to access home and community.  Baseline: unable Goal status: INITIAL  6.  Gaege Eltringham will demonstrate > 19/24 on DGI to demonstrate decreased risk of falls.   Baseline: NT today Goal status: INITIAL  7.  Rasool Lenig will demonstrate improved functional LE strength by completing 5x STS in <13 seconds without UE assist Baseline: 13.5 sec with UE assist.  Cannot stand without UE assist.  Goal status: INITIAL  8.  Abdulahad Hayden will demonstrate <40% disability on LEFS to demonstrate improved mobility.  Baseline: 40/80 = 50% disability Goal status: INITIAL  9.  Makyi Todisco will be able to return to walking 5K daily without limitation from R knee pain or LBP.  Baseline: unable Goal status: INITIAL    PLAN: PT FREQUENCY: 2x/week  PT DURATION: 6 weeks  PLANNED INTERVENTIONS: Therapeutic exercises, Therapeutic activity, Neuromuscular re-education, Balance training, Gait training, Patient/Family education, Joint mobilization, Stair training, Dry Needling, Cryotherapy, Moist heat, scar mobilization, Vasopneumatic device, Ultrasound, and Manual therapy  PLAN FOR NEXT SESSION: progress LE strengthening and ROM as tolerated.    Artist Pais, PTA 02/06/2022, 9:33 AM

## 2022-02-06 NOTE — Telephone Encounter (Signed)
Rx for [facility] sent to [facility].

## 2022-02-07 ENCOUNTER — Encounter: Payer: Self-pay | Admitting: Cardiology

## 2022-02-07 ENCOUNTER — Other Ambulatory Visit (HOSPITAL_BASED_OUTPATIENT_CLINIC_OR_DEPARTMENT_OTHER): Payer: Self-pay

## 2022-02-07 NOTE — Telephone Encounter (Signed)
Spoke to patient's wife to address confusion regarding prescription sent to [facility]. Erik Salazar was not available as he was speaking with PT. Will call back later to discuss options for medication management of cholesterol.

## 2022-02-07 NOTE — Telephone Encounter (Signed)
Pt had questions regard recent calls from [employer] and [employer]. Pt informed that both are associated with getting approval for his inclisiran. Pt says he would prefer inclisiran over [place]ust. Repatha prescription was canceled. Pt will be called next week when inclisiran arrives from the [facility].

## 2022-02-08 ENCOUNTER — Telehealth: Payer: Self-pay | Admitting: Cardiology

## 2022-02-08 ENCOUNTER — Encounter: Payer: Self-pay | Admitting: Cardiology

## 2022-02-08 ENCOUNTER — Other Ambulatory Visit: Payer: Self-pay | Admitting: *Deleted

## 2022-02-08 ENCOUNTER — Other Ambulatory Visit (HOSPITAL_BASED_OUTPATIENT_CLINIC_OR_DEPARTMENT_OTHER): Payer: Self-pay

## 2022-02-08 NOTE — Telephone Encounter (Signed)
Pt c/o medication issue:  1. Name of Medication: Leqvio  2. How are you currently taking this medication (dosage and times per day)?   3. Are you having a reaction (difficulty breathing--STAT)?   4. What is your medication issue? Pharmacy is calling to get clarification on this medication.    [phone]

## 2022-02-08 NOTE — Telephone Encounter (Signed)
I did speak with patient and [facility]. [facility]. They will run through plan and then call pt for consent to ship. I did make pt aware that they would call him 1 more time. Sounds like [employer] also called pt today.

## 2022-02-09 ENCOUNTER — Encounter: Payer: Self-pay | Admitting: Physical Therapy

## 2022-02-09 ENCOUNTER — Ambulatory Visit: Payer: Medicare HMO | Admitting: Physical Therapy

## 2022-02-09 ENCOUNTER — Other Ambulatory Visit (HOSPITAL_BASED_OUTPATIENT_CLINIC_OR_DEPARTMENT_OTHER): Payer: Self-pay

## 2022-02-09 DIAGNOSIS — M25561 Pain in right knee: Secondary | ICD-10-CM | POA: Diagnosis not present

## 2022-02-09 DIAGNOSIS — G8929 Other chronic pain: Secondary | ICD-10-CM

## 2022-02-09 DIAGNOSIS — R600 Localized edema: Secondary | ICD-10-CM

## 2022-02-09 DIAGNOSIS — R252 Cramp and spasm: Secondary | ICD-10-CM

## 2022-02-09 DIAGNOSIS — M6281 Muscle weakness (generalized): Secondary | ICD-10-CM

## 2022-02-09 DIAGNOSIS — M25661 Stiffness of right knee, not elsewhere classified: Secondary | ICD-10-CM

## 2022-02-09 DIAGNOSIS — R262 Difficulty in walking, not elsewhere classified: Secondary | ICD-10-CM

## 2022-02-12 ENCOUNTER — Other Ambulatory Visit (HOSPITAL_BASED_OUTPATIENT_CLINIC_OR_DEPARTMENT_OTHER): Payer: Self-pay

## 2022-02-12 ENCOUNTER — Other Ambulatory Visit: Payer: Self-pay | Admitting: Orthopaedic Surgery

## 2022-02-12 ENCOUNTER — Encounter: Payer: Self-pay | Admitting: Orthopaedic Surgery

## 2022-02-12 MED ORDER — HYDROCODONE-ACETAMINOPHEN 5-325 MG PO TABS
1.0000 | ORAL_TABLET | ORAL | 0 refills | Status: DC | PRN
  Filled 2022-02-12: qty 30, 5d supply, fill #0

## 2022-02-13 ENCOUNTER — Other Ambulatory Visit: Payer: Self-pay | Admitting: Pharmacist

## 2022-02-13 ENCOUNTER — Ambulatory Visit: Payer: Medicare HMO | Attending: Orthopaedic Surgery | Admitting: Physical Therapy

## 2022-02-13 ENCOUNTER — Encounter: Payer: Self-pay | Admitting: Physical Therapy

## 2022-02-13 ENCOUNTER — Encounter: Payer: Self-pay | Admitting: Pharmacist

## 2022-02-13 DIAGNOSIS — R252 Cramp and spasm: Secondary | ICD-10-CM

## 2022-02-13 DIAGNOSIS — R600 Localized edema: Secondary | ICD-10-CM

## 2022-02-13 DIAGNOSIS — I639 Cerebral infarction, unspecified: Secondary | ICD-10-CM

## 2022-02-13 DIAGNOSIS — M6281 Muscle weakness (generalized): Secondary | ICD-10-CM

## 2022-02-13 DIAGNOSIS — M25661 Stiffness of right knee, not elsewhere classified: Secondary | ICD-10-CM

## 2022-02-13 DIAGNOSIS — E782 Mixed hyperlipidemia: Secondary | ICD-10-CM

## 2022-02-13 DIAGNOSIS — R262 Difficulty in walking, not elsewhere classified: Secondary | ICD-10-CM

## 2022-02-13 DIAGNOSIS — M25561 Pain in right knee: Secondary | ICD-10-CM

## 2022-02-13 NOTE — Telephone Encounter (Signed)
I spoke with pharmacy on 6/22. Clarification provided.

## 2022-02-14 ENCOUNTER — Encounter: Payer: Self-pay | Admitting: Gastroenterology

## 2022-02-16 ENCOUNTER — Other Ambulatory Visit (HOSPITAL_BASED_OUTPATIENT_CLINIC_OR_DEPARTMENT_OTHER): Payer: Self-pay

## 2022-02-16 ENCOUNTER — Ambulatory Visit: Payer: Medicare HMO | Admitting: Physical Therapy

## 2022-02-16 ENCOUNTER — Encounter: Payer: Self-pay | Admitting: Physical Therapy

## 2022-02-16 DIAGNOSIS — M25561 Pain in right knee: Secondary | ICD-10-CM

## 2022-02-16 DIAGNOSIS — R252 Cramp and spasm: Secondary | ICD-10-CM

## 2022-02-16 DIAGNOSIS — G8929 Other chronic pain: Secondary | ICD-10-CM

## 2022-02-16 DIAGNOSIS — R262 Difficulty in walking, not elsewhere classified: Secondary | ICD-10-CM

## 2022-02-16 DIAGNOSIS — M25661 Stiffness of right knee, not elsewhere classified: Secondary | ICD-10-CM

## 2022-02-16 DIAGNOSIS — R600 Localized edema: Secondary | ICD-10-CM

## 2022-02-16 DIAGNOSIS — M6281 Muscle weakness (generalized): Secondary | ICD-10-CM

## 2022-02-16 NOTE — Therapy (Signed)
OUTPATIENT PHYSICAL THERAPY TREATMENT   Patient Name: Erik Salazar MRN: [id number] DOB:[birth year], 71 y.o., male Today's Date: 02/16/2022   PT End of Session - 02/16/22 0802     Visit Number 5    Number of Visits 12    Date for PT Re-Evaluation 03/15/22    Authorization Type [employer]    Progress Note Due on Visit 10    PT Start Time 0800    PT Stop Time 0853    PT Time Calculation (min) 53 min    Activity Tolerance Patient tolerated treatment well    Behavior During Therapy [facility] for tasks assessed/performed              Past Medical History:  Diagnosis Date   Arthritis    RA and OA   Carpal tunnel syndrome on right 07/08/2017   Cervical radiculopathy at C6 74/82/7078   Right   Complication of anesthesia    Coronary artery disease    Enlarged prostate    Fibromyalgia    H/O: knee surgery    15    History of blood clots    History of open heart surgery    ASCENDING AORTIC ANEURYSM   Ischemic stroke of frontal lobe ([place]) 03/14/2017   Bilateral; post-redo CT surgery   Pneumonia    PONV (postoperative nausea and vomiting)    only after CABG surgeries   Seizure disorder ([place]) 03/14/2017   Seizures ([place])    Status post knee surgery    DVT POST KNEE SURGERY   Stroke [facility])    Past Surgical History:  Procedure Laterality Date   ANTERIOR CERVICAL DECOMP/DISCECTOMY FUSION N/A 05/04/2020   Procedure: Anterior Cervical Decompression/Discectomy Fuion Cervical three-four, Cervical four-five, Cervical five-six;  Surgeon: Kary Kos, MD;  Location: [place];  Service: Neurosurgery;  Laterality: N/A;   BALLOON DILATION N/A 06/23/2019   Procedure: BALLOON DILATION;  Surgeon: Otis Brace, MD;  [facility];  Service: Gastroenterology;  Laterality: N/A;   BENTALL PROCEDURE  01/04/2016   Bentall with 23 mm pericardial AVR; SVG-LAD, SVG-CX ([place])   BICEPS TENDON REPAIR Right    BIOPSY  06/23/2019   Procedure: BIOPSY;  Surgeon:  Otis Brace, MD;  [facility];  Service: Gastroenterology;;   CARDIAC SURGERY     ANUERSYM MAY 2017   Bentall procedure. Bioprosthetic aortic valve #23 mm bovine model #2700 TF ask, and 28 mm Gelweave woven vascular sinus of Valsalva graft   CARPAL TUNNEL RELEASE  08/2018   right hand    CORONARY ARTERY BYPASS GRAFT  01/04/2016   VG to LAD & VG to LCX   CORONARY ARTERY BYPASS GRAFT  03/13/2017   LIMA to LAD with steril abcess removal from dacron graft   ESOPHAGOGASTRODUODENOSCOPY     Had done dilatation done about 2 or 3 times before in [place]   ESOPHAGOGASTRODUODENOSCOPY (EGD) WITH PROPOFOL N/A 06/23/2019   Procedure: ESOPHAGOGASTRODUODENOSCOPY (EGD) WITH PROPOFOL;  Surgeon: Otis Brace, MD;  [facility];  Service: Gastroenterology;  Laterality: N/A;   FALSE ANEURYSM REPAIR  03/13/2017   redo sternotomy, sterile abscess removal from Dacron graft, omental flap around aorta, CABG: LIMA-LAD ([name], Dr. Mart Piggs)   [place]  2018   Of pericardium 2018   INSERTION OF MESH  10/14/2020   Procedure: INSERTION OF MESH;  Surgeon: Michael Boston, MD;  Location: [facility];  Service: General;;   knee surgeries     13 surgeries on knee done before knee  replacement    KNEE SURGERY  1983   LAPAROSCOPIC LYSIS OF ADHESIONS N/A 05/19/2021   Procedure: LYSIS OF ADHESIONS;  Surgeon: Michael Boston, MD;  Location: [facility];  Service: General;  Laterality: N/A;  GEN & LOCAL   LYSIS OF ADHESION N/A 10/14/2020   Procedure: LYSIS OF ADHESION;  Surgeon: Michael Boston, MD;  Location: [facility];  Service: General;  Laterality: N/A;   open heart surgery     03-13-2017   REPLACEMENT TOTAL KNEE Left 2015   ROTATOR CUFF REPAIR Right    done with biceps tendon repair   TONSILLECTOMY     removed as a child.   TOTAL KNEE ARTHROPLASTY Right 01/18/2022   Procedure: RIGHT TOTAL KNEE ARTHROPLASTY;  Surgeon: Leandrew Koyanagi, MD;  Location: [place];  Service: Orthopedics;  Laterality:  Right;   VENTRAL HERNIA REPAIR N/A 10/14/2020   Procedure: LAPAROSCOPIC VENTRAL HERNIA REPAIR WITH TAP BLOCK BILATERAL;  Surgeon: Michael Boston, MD;  Location: [place];  Service: General;  Laterality: N/A;   VENTRAL HERNIA REPAIR N/A 05/19/2021   Procedure: LAPAROSCOPIC VENTRAL WALL HERNIA REPAIR;  Surgeon: Michael Boston, MD;  Location: [facility];  Service: General;  Laterality: N/A;   Patient Active Problem List   Diagnosis Date Noted   Gait abnormality 01/29/2022   Status post total right knee replacement 01/18/2022   Coronary artery disease involving native coronary artery of native heart without angina pectoris 01/04/2022   Preop cardiovascular exam 01/04/2022   Primary osteoarthritis of right knee 01/04/2022   Acute right-sided low back pain without sciatica 12/25/2021   Aortic atherosclerosis ([place]) 09/04/2021   Spondylolisthesis at L4-L5 level 06/28/2021   S/P repair of ventral hernia 05/19/2021   S/P repair of recurrent ventral hernia 05/19/2021   Benign prostatic hyperplasia without lower urinary tract symptoms 10/14/2020   Chronic pain 10/14/2020   ED (erectile dysfunction) of organic origin 10/14/2020   Esophageal dysphagia 10/14/2020   Gastroesophageal reflux disease 10/14/2020   History of aortic valve replacement with bioprosthetic valve 10/14/2020   Hx of aortic aneurysm repair 10/14/2020   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   Thoracic aortic aneurysm without rupture ([place]) 10/14/2020   Recurrent incisional hernia 10/14/2020   Spinal stenosis in cervical region 05/04/2020   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   Cervical radiculopathy at C6 11/14/2017   Carpal tunnel syndrome on right 07/08/2017   Tremor, essential 07/08/2017   Ischemic stroke of frontal lobe ([place]) 04/05/2017   Pain in right hand 04/05/2017   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 03/27/2017   Seizure ([place]) 03/14/2017   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/13/2017   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[place])  03/12/2017   Essential hypertension 02/26/2017   Mixed hyperlipidemia 02/26/2017    PCP: Marrian Salvage, FNP  REFERRING PROVIDER: Leandrew Koyanagi, MD  REFERRING DIAG: s/p R TKA  THERAPY DIAG:  Acute pain of right knee  Stiffness of right knee, not elsewhere classified  Difficulty in walking, not elsewhere classified  Localized edema  Muscle weakness (generalized)  Cramp and spasm  Chronic left-sided low back pain with left-sided sciatica  Rationale for Evaluation and Treatment Rehabilitation  ONSET DATE: 01/18/2022  SUBJECTIVE:   SUBJECTIVE STATEMENT: Pt. Didn't take any medication yesterday, but woke up with his knee "barking" took hydrocodone but it didn't help.  Already walked 1K steps today.   Reported hip very sore after manual to ITB last session.   PERTINENT HISTORY: History chronic back pain, multiple back surgeries, L  knee replacement, DVT, CVA frontal lobe with memory deficits, seizures, open heart surgery to repair aneurism, CABG x 2 , ACDF, hernia repair,omental flap graft to mediastinum.  PAIN:  Are you having pain? Yes: NPRS scale: 7/10 Pain location: R knee Pain description: constant ache, itchy Aggravating factors: bending Relieving factors: elevation, ice  PRECAUTIONS: None  WEIGHT BEARING RESTRICTIONS No  FALLS:  Has patient fallen in last 6 months? No  LIVING ENVIRONMENT: Lives with: lives with their spouse Lives in: House/apartment Stairs: No Has following equipment at home: Single point cane, Environmental consultant - 2 wheeled, and bed side commode  OCCUPATION: retired, exercises frequently  PLOF: [place] get back to normal, do 5k every morning on treadmill   OBJECTIVE:   DIAGNOSTIC FINDINGS: DG R knee 01/18/2022 FINDINGS: Right femoral and tibial components are well situated. Expected postoperative changes are seen in the soft tissues anteriorly.  PATIENT SURVEYS:  LEFS 40/80= 50% disability   COGNITION:  Overall  cognitive status: Within functional limits for tasks assessed     SENSATION: WFL  EDEMA:  Circumferential: midcalf R 39.0 cm, L 36.5 cm   POSTURE: decreased lumbar lordosis  PALPATION: Noted increased edema R calf, but no tenderness with palpation/squeeze test, n4o s/s of DVT.  Incision covered with bandage still.  No s/s of infection.  Bruising from nerve block R thigh.    LOWER EXTREMITY ROM:  Active ROM Right eval Left eval Right  02/09/22 Right  02/13/22  Knee flexion 100 sitting 110 supine (passive) 122 sitting 130 supine 120 supine 125 supine  Knee extension 10 0 8 7   (Blank rows = not tested)  LOWER EXTREMITY MMT:  MMT Right eval Left eval  Hip flexion 3+ 5  Hip extension 4+ 4  Hip abduction 5 5  Hip adduction 5 5  Knee flexion 4+ 5  Knee extension 2+ quad lag 12 deg 5  Ankle dorsiflexion 5 5   (Blank rows = not tested)  HAMSTRING: measured through popliteal angle lacking 50 deg R, 20 deg L.   FUNCTIONAL TESTS:  5 times sit to stand: 13.5 sec with UE assist  GAIT: Distance walked: 51' Assistive device utilized: Single point cane Level of assistance: Modified independence Comments: antalgic, decreased stance time, heel strike on R, decreased gait speed 0.65 m/s, wide BOS    TODAY'S TREATMENT: 02/16/2022 Therapeutic Exercise: to improve strength and mobility.  Demo, verbal and tactile cues throughout for technique. Bike L1 x 6 min seat 7 Standing HS stretch 2 x 30 sec at wall with PT externally rotating tibia to promote full knee extension TKE with GTB 3 x 10     Stretches: ITB 2X30 sec with strap, HS 2x 60 min, knee flex stretch with strap 2 x 60 sec Sit to stands 2 x 10 GTB to bias glutes Cybex knee flexion 20# 3 x 10  Cybex knee extension 20# 3 x 10  SAQ 2 x 15  Bridges 3 x 10  Manual Therapy: to improve ROM IR of femur with ER of tibia mobs x 10, followed by extension overpressure with IR femur/ER tibia to improve extension. Vasopneumatic:  Gameready at end of session for post session soreness/edema.  10 min, low compression, 34 deg (coldest).  02/13/22 Therapeutic Exercise: to improve strength and mobility.  Demo, verbal and tactile cues throughout for technique. Bike L1 x 6 min seat 6    Stretches: ITB 2X30 sec with strap, HS 2x 60 min, knee flex stretch with strap  2 x 60 sec Sit to stands x 10 Gait x 180 ft using mirror for visual cues   SAQ 1 x 15 R Quad sets (heel elevated) x 5 5 sec hold R SLR 1 x 15 bil Bridges 2 x 15  Manual Therapy: to decrease muscle spasm and pain and improve mobility ITB MFR in SDLY  Vasopneumatic: Gameready at end of session for post session soreness/edema.  10 min, low compression, 34 deg (coldest). In sitting with R foot propped on blue bolster on 8 inch stool for ext stretch.    02/09/2022 Therapeutic Exercise: to improve strength and mobility.  Demo, verbal and tactile cues throughout for technique. Bike L1 x 7 min seat 7    HS stretch with strap RLE  3 x 30 sec  Sit to stands x 10 Supine hamstring curls on ball x 20 with strap assisting.   SAQ 2 x 10 R Quad sets (heel elevated) x 10 5 sec hold R SLR 2 x 10 bil Bridges 2 x 15 Manual Therapy: to decrease muscle spasm and pain and improve mobility PA mobs tibia on femur to improve extension, STM to bil hamstrings, extension with IR to improve extension. Vasopneumatic: Gameready at end of session for post session soreness/edema.  10 min, low compression, 34 deg (coldest).    PATIENT EDUCATION:  Education details: HEP Update 02/16/22 Person educated: Patient Education method: Explanation Education comprehension: verbalized understanding   HOME EXERCISE PROGRAM: Access Code: [facility]  ASSESSMENT:  CLINICAL IMPRESSION: Gerhard [place] continues to make good progress, tolerated progression of exercises to include more resistance today without increased pain.  Focused on knee extension today, after manual therapy measured lacking  only 5 deg of extension.  Added TKE to HEP.  Ended session with vaso to address swelling in the knee post exercise, in supine per patient request as sitting was very uncomfortable last session.   OBJECTIVE IMPAIRMENTS Abnormal gait, decreased endurance, decreased mobility, difficulty walking, decreased ROM, decreased strength, increased edema, increased fascial restrictions, increased muscle spasms, impaired flexibility, and pain.   ACTIVITY LIMITATIONS carrying, lifting, bending, sitting, standing, sleeping, stairs, transfers, and locomotion level  PARTICIPATION LIMITATIONS: driving, community activity, and yard work  [place] Past/current experiences, Transportation, and 3+ comorbidities: History chronic back pain, multiple back surgeries, L  knee replacement, DVT, CVA frontal lobe with memory deficits, seizures, open heart surgery to repair aneurism, CABG x 2 , ACDF, hernia repair,omental flap graft to mediastinum.  are also affecting patient's functional outcome.   REHAB POTENTIAL: Good  CLINICAL DECISION MAKING: Evolving/moderate complexity  EVALUATION COMPLEXITY: Moderate   GOALS: Goals reviewed with patient? Yes   SHORT TERM GOALS: Target date: 02/15/2022    Independent with initial HEP. Baseline: compliant with HEP from HHPT, able to verbalize and demonstrate exercises correctly, performs 3x/day.  Goal status: MET  2.  Pt. Will report 75% improvement in R knee pain Baseline: constant 5-7/10 Goal status: IN PROGRESS   LONG TERM GOALS: Target date: 03/15/2022   Independent with advanced/ongoing HEP to improve outcomes and carryover.  Baseline: needs advancement Goal status: IN PROGRESS  2.  Kalieb Bonifield will demonstrate R knee flexion to 120 deg to ascend/descend stairs. Baseline: R knee flexion 110 passive, 100 active Goal status: MET  3.  Essa Brideau will demonstrate full R knee extension for safety with gait. Baseline: lacking 10 deg  Goal status: IN  PROGRESS  4.  Izell Wynes will be able to ambulate 600' safely with LRAD and normal gait  pattern to access community.  Baseline: using SPC, decreased gait speed.  Goal status: IN PROGRESS  5.  Brock Ishii will be able to ascend/descend stairs with 1 HR and reciprocal step pattern safely to access home and community.  Baseline: unable Goal status: IN PROGRESS  6.  Upton Zecca will demonstrate > 19/24 on DGI to demonstrate decreased risk of falls.   Baseline: NT today Goal status: IN PROGRESS  7.  Weyman Magda will demonstrate improved functional LE strength by completing 5x STS in <13 seconds without UE assist Baseline: 13.5 sec with UE assist.  Cannot stand without UE assist.  Goal status: IN PROGRESS  8.  Andersen Bamberg will demonstrate <40% disability on LEFS to demonstrate improved mobility.  Baseline: 40/80 = 50% disability Goal status: IN PROGRESS  9.  Janes Blahnik will be able to return to walking 5K daily without limitation from R knee pain or LBP.  Baseline: unable Goal status: IN PROGRESS    PLAN: PT FREQUENCY: 2x/week  PT DURATION: 6 weeks  PLANNED INTERVENTIONS: Therapeutic exercises, Therapeutic activity, Neuromuscular re-education, Balance training, Gait training, Patient/Family education, Joint mobilization, Stair training, Dry Needling, Cryotherapy, Moist heat, scar mobilization, Vasopneumatic device, Ultrasound, and Manual therapy  PLAN FOR NEXT SESSION: progress LE strengthening and ROM as tolerated, focus on extension, modalities PRN.    Rennie Natter, PT, DPT  02/16/2022, 9:03 AM

## 2022-02-18 ENCOUNTER — Other Ambulatory Visit: Payer: Self-pay | Admitting: Cardiology

## 2022-02-19 ENCOUNTER — Other Ambulatory Visit: Payer: Self-pay | Admitting: Physician Assistant

## 2022-02-19 ENCOUNTER — Encounter: Payer: Self-pay | Admitting: Orthopaedic Surgery

## 2022-02-19 ENCOUNTER — Other Ambulatory Visit: Payer: Self-pay | Admitting: Orthopaedic Surgery

## 2022-02-19 ENCOUNTER — Other Ambulatory Visit (HOSPITAL_BASED_OUTPATIENT_CLINIC_OR_DEPARTMENT_OTHER): Payer: Self-pay

## 2022-02-19 MED ORDER — HYDROCODONE-ACETAMINOPHEN 5-325 MG PO TABS
1.0000 | ORAL_TABLET | Freq: Four times a day (QID) | ORAL | 0 refills | Status: DC | PRN
  Filled 2022-02-19: qty 30, 8d supply, fill #0

## 2022-02-19 NOTE — Telephone Encounter (Signed)
sent 

## 2022-02-21 ENCOUNTER — Encounter: Payer: Self-pay | Admitting: Gastroenterology

## 2022-02-21 ENCOUNTER — Other Ambulatory Visit (HOSPITAL_BASED_OUTPATIENT_CLINIC_OR_DEPARTMENT_OTHER): Payer: Self-pay

## 2022-02-21 MED ORDER — METOPROLOL SUCCINATE ER 25 MG PO TB24
12.5000 mg | ORAL_TABLET | Freq: Every day | ORAL | 3 refills | Status: DC
  Filled 2022-02-21: qty 45, 90d supply, fill #0
  Filled 2022-05-15: qty 45, 90d supply, fill #1
  Filled 2022-08-09: qty 45, 90d supply, fill #2
  Filled 2022-10-27: qty 45, 90d supply, fill #3

## 2022-02-22 ENCOUNTER — Ambulatory Visit (INDEPENDENT_AMBULATORY_CARE_PROVIDER_SITE_OTHER): Payer: Medicare HMO | Admitting: Orthopaedic Surgery

## 2022-02-22 ENCOUNTER — Other Ambulatory Visit (HOSPITAL_BASED_OUTPATIENT_CLINIC_OR_DEPARTMENT_OTHER): Payer: Self-pay

## 2022-02-22 ENCOUNTER — Encounter: Payer: Self-pay | Admitting: Orthopaedic Surgery

## 2022-02-22 ENCOUNTER — Ambulatory Visit (INDEPENDENT_AMBULATORY_CARE_PROVIDER_SITE_OTHER): Payer: Medicare HMO

## 2022-02-22 DIAGNOSIS — Z96651 Presence of right artificial knee joint: Secondary | ICD-10-CM

## 2022-02-22 MED ORDER — ACETAMINOPHEN-CODEINE 300-30 MG PO TABS
1.0000 | ORAL_TABLET | Freq: Three times a day (TID) | ORAL | 0 refills | Status: AC | PRN
  Filled 2022-02-22: qty 30, 5d supply, fill #0

## 2022-02-22 NOTE — Progress Notes (Signed)
Post-Op Visit Note   Patient: Erik Salazar           Date of Birth: Mar 15, 1951           MRN: [id number] Visit Date: 02/22/2022 PCP: Erik Salvage, FNP   Assessment & Plan:  Chief Complaint:  Chief Complaint  Patient presents with   Right Knee - Follow-up    Right total knee arthroplasty 01/18/2022   Visit Diagnoses:  1. History of total knee replacement, right     Plan: Erik Salazar is 5 weeks status post right total knee on 01/18/2022.  Main complaint is pain and swelling and difficulty with finding comfortable position at night.  Making excellent progress at PT.  Hydrocodone is not helping to a significant degree with the pain.  Examination right knee shows a fully healed surgical scar.  Moderate swelling.  No signs of infection.  Range of motion 7 to 125 degrees based on PT report.  Implant looks good on x-ray.  [name] is recovering appropriately.  I think his complaints are typical for postoperative recovery.  No evidence of infection.  Finish out the Xarelto and then can go back to his baseline baby aspirin.  Tylenol with codeine was prescribed.  Continue to use ice aggressively.  May want to back off on some activity for a little bit.  Recheck in 7 weeks.  Follow-Up Instructions: Return in about 7 weeks (around 04/12/2022).   Orders:  Orders Placed This Encounter  Procedures   XR Knee 1-2 Views Right   Meds ordered this encounter  Medications   acetaminophen-codeine (TYLENOL #3) 300-30 MG tablet    Sig: Take 1-2 tablets by mouth every 8 (eight) hours as needed for up to 7 days for moderate pain.    Dispense:  30 tablet    Refill:  0    Imaging: XR Knee 1-2 Views Right  Result Date: 02/22/2022 Stable total knee replacement in good alignment    PMFS History: Patient Active Problem List   Diagnosis Date Noted   Gait abnormality 01/29/2022   Status post total right knee replacement 01/18/2022   Coronary artery disease involving native coronary artery of native  heart without angina pectoris 01/04/2022   Preop cardiovascular exam 01/04/2022   Primary osteoarthritis of right knee 01/04/2022   Acute right-sided low back pain without sciatica 12/25/2021   Aortic atherosclerosis ([place]) 09/04/2021   Spondylolisthesis at L4-L5 level 06/28/2021   S/P repair of ventral hernia 05/19/2021   S/P repair of recurrent ventral hernia 05/19/2021   Benign prostatic hyperplasia without lower urinary tract symptoms 10/14/2020   Chronic pain 10/14/2020   ED (erectile dysfunction) of organic origin 10/14/2020   Esophageal dysphagia 10/14/2020   Gastroesophageal reflux disease 10/14/2020   History of aortic valve replacement with bioprosthetic valve 10/14/2020   Hx of aortic aneurysm repair 10/14/2020   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   Thoracic aortic aneurysm without rupture ([place]) 10/14/2020   Recurrent incisional hernia 10/14/2020   Spinal stenosis in cervical region 05/04/2020   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   Cervical radiculopathy at C6 11/14/2017   Carpal tunnel syndrome on right 07/08/2017   Tremor, essential 07/08/2017   Ischemic stroke of frontal lobe ([place]) 04/05/2017   Pain in right hand 04/05/2017   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 03/27/2017   Seizure ([place]) 03/14/2017   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/13/2017   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[place]) 03/12/2017   Essential hypertension 02/26/2017   Mixed hyperlipidemia 02/26/2017  Past Medical History:  Diagnosis Date   Arthritis    RA and OA   Carpal tunnel syndrome on right 07/08/2017   Cervical radiculopathy at C6 02/17/1600   Right   Complication of anesthesia    Coronary artery disease    Enlarged prostate    Fibromyalgia    H/O: knee surgery    15    History of blood clots    History of open heart surgery    ASCENDING AORTIC ANEURYSM   Ischemic stroke of frontal lobe ([place]) 03/14/2017   Bilateral; post-redo CT surgery   Pneumonia    PONV (postoperative nausea  and vomiting)    only after CABG surgeries   Seizure disorder ([place]) 03/14/2017   Seizures ([place])    Status post knee surgery    DVT POST KNEE SURGERY   Stroke [facility])     Family History  Problem Relation Age of Onset   Arthritis Mother    Aneurysm Mother        brain aneurysm for mother.    Arthritis Father    Colon cancer Neg Hx    Esophageal cancer Neg Hx     Past Surgical History:  Procedure Laterality Date   ANTERIOR CERVICAL DECOMP/DISCECTOMY FUSION N/A 05/04/2020   Procedure: Anterior Cervical Decompression/Discectomy Fuion Cervical three-four, Cervical four-five, Cervical five-six;  Surgeon: Erik Kos, MD;  Location: [place];  Service: Neurosurgery;  Laterality: N/A;   BALLOON DILATION N/A 06/23/2019   Procedure: BALLOON DILATION;  Surgeon: Erik Brace, MD;  [facility];  Service: Gastroenterology;  Laterality: N/A;   BENTALL PROCEDURE  01/04/2016   Bentall with 23 mm pericardial AVR; SVG-LAD, SVG-CX ([place])   BICEPS TENDON REPAIR Right    BIOPSY  06/23/2019   Procedure: BIOPSY;  Surgeon: Erik Brace, MD;  [facility];  Service: Gastroenterology;;   CARDIAC SURGERY     ANUERSYM MAY 2017   Bentall procedure. Bioprosthetic aortic valve #23 mm bovine model #2700 TF ask, and 28 mm Gelweave woven vascular sinus of Valsalva graft   CARPAL TUNNEL RELEASE  08/2018   right hand    CORONARY ARTERY BYPASS GRAFT  01/04/2016   VG to LAD & VG to LCX   CORONARY ARTERY BYPASS GRAFT  03/13/2017   LIMA to LAD with steril abcess removal from dacron graft   ESOPHAGOGASTRODUODENOSCOPY     Had done dilatation done about 2 or 3 times before in [place]   ESOPHAGOGASTRODUODENOSCOPY (EGD) WITH PROPOFOL N/A 06/23/2019   Procedure: ESOPHAGOGASTRODUODENOSCOPY (EGD) WITH PROPOFOL;  Surgeon: Erik Brace, MD;  [facility];  Service: Gastroenterology;  Laterality: N/A;   FALSE ANEURYSM REPAIR  03/13/2017   redo sternotomy, sterile  abscess removal from Dacron graft, omental flap around aorta, CABG: LIMA-LAD ([name], Dr. Mart Piggs)   [place]  2018   Of pericardium 2018   INSERTION OF MESH  10/14/2020   Procedure: INSERTION OF MESH;  Surgeon: Erik Boston, MD;  Location: [place];  Service: General;;   knee surgeries     13 surgeries on knee done before knee replacement    [place] N/A 05/19/2021   Procedure: LYSIS OF ADHESIONS;  Surgeon: Erik Boston, MD;  Location: [place];  Service: General;  Laterality: N/A;  GEN & LOCAL   LYSIS OF ADHESION N/A 10/14/2020   Procedure: LYSIS OF ADHESION;  Surgeon: Erik Boston, MD;  Location: [place];  Service: General;  Laterality:  N/A;   open heart surgery     03-13-2017   REPLACEMENT TOTAL KNEE Left 2015   ROTATOR CUFF REPAIR Right    done with biceps tendon repair   TONSILLECTOMY     removed as a child.   TOTAL KNEE ARTHROPLASTY Right 01/18/2022   Procedure: RIGHT TOTAL KNEE ARTHROPLASTY;  Surgeon: Leandrew Koyanagi, MD;  Location: [place];  Service: Orthopedics;  Laterality: Right;   VENTRAL HERNIA REPAIR N/A 10/14/2020   Procedure: LAPAROSCOPIC VENTRAL HERNIA REPAIR WITH TAP BLOCK BILATERAL;  Surgeon: Erik Boston, MD;  Location: [place];  Service: General;  Laterality: N/A;   VENTRAL HERNIA REPAIR N/A 05/19/2021   Procedure: LAPAROSCOPIC VENTRAL WALL HERNIA REPAIR;  Surgeon: Erik Boston, MD;  Location: [place];  Service: General;  Laterality: N/A;   Social History   Occupational History   Occupation: RETIRED  Tobacco Use   Smoking status: Former    Types: Cigars    Quit date: 2021    Years since quitting: 2.5   Smokeless tobacco: Never   Tobacco comments:    Only smoked cigars for 6 months  Vaping Use   Vaping Use: Never used  Substance and Sexual Activity   Alcohol use: Yes    Alcohol/week: 1.0 standard drink of alcohol    Types: 1 Cans of beer per week    Comment: occassionally   Drug use: No   Sexual  activity: Not on file

## 2022-02-23 ENCOUNTER — Ambulatory Visit: Payer: Medicare HMO | Attending: Orthopaedic Surgery | Admitting: Physical Therapy

## 2022-02-23 ENCOUNTER — Encounter: Payer: Self-pay | Admitting: Physical Therapy

## 2022-02-23 DIAGNOSIS — R252 Cramp and spasm: Secondary | ICD-10-CM | POA: Diagnosis present

## 2022-02-23 DIAGNOSIS — M25561 Pain in right knee: Secondary | ICD-10-CM | POA: Insufficient documentation

## 2022-02-23 DIAGNOSIS — M25661 Stiffness of right knee, not elsewhere classified: Secondary | ICD-10-CM | POA: Insufficient documentation

## 2022-02-23 DIAGNOSIS — M5442 Lumbago with sciatica, left side: Secondary | ICD-10-CM | POA: Diagnosis present

## 2022-02-23 DIAGNOSIS — R262 Difficulty in walking, not elsewhere classified: Secondary | ICD-10-CM | POA: Insufficient documentation

## 2022-02-23 DIAGNOSIS — R600 Localized edema: Secondary | ICD-10-CM | POA: Diagnosis present

## 2022-02-23 DIAGNOSIS — G8929 Other chronic pain: Secondary | ICD-10-CM | POA: Diagnosis present

## 2022-02-23 DIAGNOSIS — M6281 Muscle weakness (generalized): Secondary | ICD-10-CM | POA: Diagnosis present

## 2022-02-23 NOTE — Therapy (Signed)
OUTPATIENT PHYSICAL THERAPY TREATMENT   Patient Name: Erik Salazar MRN: [id number] DOB:January 26, 1951, 71 y.o., male Today's Date: 02/23/2022   PT End of Session - 02/23/22 0805     Visit Number 6    Number of Visits 12    Date for PT Re-Evaluation 03/15/22    Authorization Type [employer]    Progress Note Due on Visit 10    PT Start Time 0802    PT Stop Time 0845    PT Time Calculation (min) 43 min    Activity Tolerance Patient tolerated treatment well    Behavior During Therapy [facility] for tasks assessed/performed              Past Medical History:  Diagnosis Date   Arthritis    RA and OA   Carpal tunnel syndrome on right 07/08/2017   Cervical radiculopathy at C6 13/03/6577   Right   Complication of anesthesia    Coronary artery disease    Enlarged prostate    Fibromyalgia    H/O: knee surgery    15    History of blood clots    History of open heart surgery    ASCENDING AORTIC ANEURYSM   Ischemic stroke of frontal lobe ([place]) 03/14/2017   Bilateral; post-redo CT surgery   Pneumonia    PONV (postoperative nausea and vomiting)    only after CABG surgeries   Seizure disorder ([place]) 03/14/2017   Seizures ([place])    Status post knee surgery    DVT POST KNEE SURGERY   Stroke [facility])    Past Surgical History:  Procedure Laterality Date   ANTERIOR CERVICAL DECOMP/DISCECTOMY FUSION N/A 05/04/2020   Procedure: Anterior Cervical Decompression/Discectomy Fuion Cervical three-four, Cervical four-five, Cervical five-six;  Surgeon: Kary Kos, MD;  Location: [place];  Service: Neurosurgery;  Laterality: N/A;   BALLOON DILATION N/A 06/23/2019   Procedure: BALLOON DILATION;  Surgeon: Otis Brace, MD;  Location: [facility];  Service: Gastroenterology;  Laterality: N/A;   BENTALL PROCEDURE  01/04/2016   Bentall with 23 mm pericardial AVR; SVG-LAD, SVG-CX ([place])   BICEPS TENDON REPAIR Right    BIOPSY  06/23/2019   Procedure: BIOPSY;  Surgeon:  Otis Brace, MD;  Location: [facility];  Service: Gastroenterology;;   CARDIAC SURGERY     ANUERSYM MAY 2017   Bentall procedure. Bioprosthetic aortic valve #23 mm bovine model #2700 TF ask, and 28 mm Gelweave woven vascular sinus of Valsalva graft   CARPAL TUNNEL RELEASE  08/2018   right hand    CORONARY ARTERY BYPASS GRAFT  01/04/2016   VG to LAD & VG to LCX   CORONARY ARTERY BYPASS GRAFT  03/13/2017   LIMA to LAD with steril abcess removal from dacron graft   ESOPHAGOGASTRODUODENOSCOPY     Had done dilatation done about 2 or 3 times before in [place]   ESOPHAGOGASTRODUODENOSCOPY (EGD) WITH PROPOFOL N/A 06/23/2019   Procedure: ESOPHAGOGASTRODUODENOSCOPY (EGD) WITH PROPOFOL;  Surgeon: Otis Brace, MD;  Location: [facility];  Service: Gastroenterology;  Laterality: N/A;   FALSE ANEURYSM REPAIR  03/13/2017   redo sternotomy, sterile abscess removal from Dacron graft, omental flap around aorta, CABG: LIMA-LAD ([name], Dr. Mart Piggs)   [place]  2018   Of pericardium 2018   INSERTION OF MESH  10/14/2020   Procedure: INSERTION OF MESH;  Surgeon: Michael Boston, MD;  Location: [place] OR;  Service: General;;   knee surgeries     13 surgeries on knee done before knee  replacement    KNEE SURGERY  1983   LAPAROSCOPIC LYSIS OF ADHESIONS N/A 05/19/2021   Procedure: LYSIS OF ADHESIONS;  Surgeon: Michael Boston, MD;  Location: [place];  Service: General;  Laterality: N/A;  GEN & LOCAL   LYSIS OF ADHESION N/A 10/14/2020   Procedure: LYSIS OF ADHESION;  Surgeon: Michael Boston, MD;  Location: [place];  Service: General;  Laterality: N/A;   open heart surgery     03-13-2017   REPLACEMENT TOTAL KNEE Left 2015   ROTATOR CUFF REPAIR Right    done with biceps tendon repair   TONSILLECTOMY     removed as a child.   TOTAL KNEE ARTHROPLASTY Right 01/18/2022   Procedure: RIGHT TOTAL KNEE ARTHROPLASTY;  Surgeon: Leandrew Koyanagi, MD;  Location: [place];  Service: Orthopedics;  Laterality:  Right;   VENTRAL HERNIA REPAIR N/A 10/14/2020   Procedure: LAPAROSCOPIC VENTRAL HERNIA REPAIR WITH TAP BLOCK BILATERAL;  Surgeon: Michael Boston, MD;  Location: [place];  Service: General;  Laterality: N/A;   VENTRAL HERNIA REPAIR N/A 05/19/2021   Procedure: LAPAROSCOPIC VENTRAL WALL HERNIA REPAIR;  Surgeon: Michael Boston, MD;  Location: [place];  Service: General;  Laterality: N/A;   Patient Active Problem List   Diagnosis Date Noted   Gait abnormality 01/29/2022   Status post total right knee replacement 01/18/2022   Coronary artery disease involving native coronary artery of native heart without angina pectoris 01/04/2022   Preop cardiovascular exam 01/04/2022   Primary osteoarthritis of right knee 01/04/2022   Acute right-sided low back pain without sciatica 12/25/2021   Aortic atherosclerosis ([place]) 09/04/2021   Spondylolisthesis at L4-L5 level 06/28/2021   S/P repair of ventral hernia 05/19/2021   S/P repair of recurrent ventral hernia 05/19/2021   Benign prostatic hyperplasia without lower urinary tract symptoms 10/14/2020   Chronic pain 10/14/2020   ED (erectile dysfunction) of organic origin 10/14/2020   Esophageal dysphagia 10/14/2020   Gastroesophageal reflux disease 10/14/2020   History of aortic valve replacement with bioprosthetic valve 10/14/2020   Hx of aortic aneurysm repair 10/14/2020   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   Thoracic aortic aneurysm without rupture ([place]) 10/14/2020   Recurrent incisional hernia 10/14/2020   Spinal stenosis in cervical region 05/04/2020   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   Cervical radiculopathy at C6 11/14/2017   Carpal tunnel syndrome on right 07/08/2017   Tremor, essential 07/08/2017   Ischemic stroke of frontal lobe ([place]) 04/05/2017   Pain in right hand 04/05/2017   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 03/27/2017   Seizure ([place]) 03/14/2017   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/13/2017   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[place])  03/12/2017   Essential hypertension 02/26/2017   Mixed hyperlipidemia 02/26/2017    PCP: Marrian Salvage, FNP  REFERRING PROVIDER: Leandrew Koyanagi, MD  REFERRING DIAG: s/p R TKA  THERAPY DIAG:  Acute pain of right knee  Stiffness of right knee, not elsewhere classified  Difficulty in walking, not elsewhere classified  Localized edema  Muscle weakness (generalized)  Cramp and spasm  Chronic left-sided low back pain with left-sided sciatica  Rationale for Evaluation and Treatment Rehabilitation  ONSET DATE: 01/18/2022  SUBJECTIVE:   SUBJECTIVE STATEMENT: Pt. Saw Dr. Erlinda Hong yesterday, said he was pleased with knee but should work on his back.  His back is very painful today.    PERTINENT HISTORY: History chronic back pain, multiple back surgeries, L  knee replacement, DVT, CVA frontal lobe with memory deficits, seizures, open heart  surgery to repair aneurism, CABG x 2 , ACDF, hernia repair,omental flap graft to mediastinum.  PAIN:  Are you having pain? Yes: NPRS scale: 7/10 Pain location: back, 5/10 R knee Pain description: constant ache, itchy Aggravating factors: bending Relieving factors: elevation, ice  PRECAUTIONS: None  WEIGHT BEARING RESTRICTIONS No  FALLS:  Has patient fallen in last 6 months? No  LIVING ENVIRONMENT: Lives with: lives with their spouse Lives in: House/apartment Stairs: No Has following equipment at home: Single point cane, Environmental consultant - 2 wheeled, and bed side commode  OCCUPATION: retired, exercises frequently  PLOF: [place] get back to normal, do 5k every morning on treadmill   OBJECTIVE:   DIAGNOSTIC FINDINGS: DG R knee 01/18/2022 FINDINGS: Right femoral and tibial components are well situated. Expected postoperative changes are seen in the soft tissues anteriorly.  PATIENT SURVEYS:  LEFS 40/80= 50% disability   COGNITION:  Overall cognitive status: Within functional limits for tasks  assessed     SENSATION: WFL  EDEMA:  Circumferential: midcalf R 39.0 cm, L 36.5 cm   POSTURE: decreased lumbar lordosis  PALPATION: Noted increased edema R calf, but no tenderness with palpation/squeeze test, no s/s of DVT.  Incision covered with bandage still.  No s/s of infection.  Bruising from nerve block R thigh.    LOWER EXTREMITY ROM:  Active ROM Right eval Left eval Right  02/09/22 Right  02/13/22  Knee flexion 100 sitting 110 supine (passive) 122 sitting 130 supine 120 supine 125 supine  Knee extension 10 0 8 7   (Blank rows = not tested)  LOWER EXTREMITY MMT:  MMT Right eval Left eval  Hip flexion 3+ 5  Hip extension 4+ 4  Hip abduction 5 5  Hip adduction 5 5  Knee flexion 4+ 5  Knee extension 2+ quad lag 12 deg 5  Ankle dorsiflexion 5 5   (Blank rows = not tested)  HAMSTRING: measured through popliteal angle lacking 50 deg R, 20 deg L.   FUNCTIONAL TESTS:  5 times sit to stand: 13.5 sec with UE assist  GAIT: Distance walked: 15' Assistive device utilized: Single point cane Level of assistance: Modified independence Comments: antalgic, decreased stance time, heel strike on R, decreased gait speed 0.65 m/s, wide BOS    TODAY'S TREATMENT: 02/23/2022 Therapeutic Exercise: to improve strength and mobility.  Demo, verbal and tactile cues throughout for technique.  MHP applied to LBP during bike and prone exercises for comfort.  Bike L2 x 6 min   Standing HS stretch 2 x 30 sec at wall with PT externally rotating tibia to promote full knee extension Bridges x 10, Bridges with RTB x 20  S/L clams RTB x 20 bil  Prone leg extensions x 20 bil - reports pulling in abdomen.  Manual Therapy: to decrease muscle spasm and pain and improve mobility IASTM with foam roller to bil QL, lumbar paraspinals in sitting, STM/TPR to lumbar paraspinals/SIJ bil, in prone over pillows skilled palpation and monitoring during dry needling. Trigger Point Dry-Needling  Treatment  instructions: Expect mild to moderate muscle soreness.  Patient verbalized understanding of these instructions and education. Patient Consent Given: Yes Education handout provided: Previously provided Muscles treated: bil glut med, piriformis Electrical stimulation performed:  Treatment response/outcome: Twitch Response Elicited and Palpable Increase in Muscle Length   02/16/2022 Therapeutic Exercise: to improve strength and mobility.  Demo, verbal and tactile cues throughout for technique. Bike L1 x 6 min seat 7 Standing HS stretch 2 x 30  sec at wall with PT externally rotating tibia to promote full knee extension TKE with GTB 3 x 10     Stretches: ITB 2X30 sec with strap, HS 2x 60 min, knee flex stretch with strap 2 x 60 sec Sit to stands 2 x 10 GTB to bias glutes Cybex knee flexion 20# 3 x 10  Cybex knee extension 20# 3 x 10  SAQ 2 x 15  Bridges 3 x 10  Manual Therapy: to improve ROM IR of femur with ER of tibia mobs x 10, followed by extension overpressure with IR femur/ER tibia to improve extension. Vasopneumatic: Gameready at end of session for post session soreness/edema.  10 min, low compression, 34 deg (coldest).  02/13/22 Therapeutic Exercise: to improve strength and mobility.  Demo, verbal and tactile cues throughout for technique. Bike L1 x 6 min seat 6    Stretches: ITB 2X30 sec with strap, HS 2x 60 min, knee flex stretch with strap 2 x 60 sec Sit to stands x 10 Gait x 180 ft using mirror for visual cues   SAQ 1 x 15 R Quad sets (heel elevated) x 5 5 sec hold R SLR 1 x 15 bil Bridges 2 x 15 Manual Therapy: to decrease muscle spasm and pain and improve mobility ITB MFR in SDLY Vasopneumatic: Gameready at end of session for post session soreness/edema.  10 min, low compression, 34 deg (coldest). In sitting with R foot propped on blue bolster on 8 inch stool for ext stretch.  PATIENT EDUCATION:  Education details: HEP Update 02/16/22 Person educated: Patient Education  method: Explanation Education comprehension: verbalized understanding   HOME EXERCISE PROGRAM: Access Code: [email or web address]  ASSESSMENT:  CLINICAL IMPRESSION: Erik Salazar is making good progress with knee, but very limited due to severe LBP, focused today on core/glut strengthening to improve both LE strength and mobility as well as address back, difficulty with prone exercises due to pulling in abdomen (history hernia repair x 2), followed by manual therapy to low back and DN to bil glutes and piriformis, as he reported significant pain relief from DN in past.  Erik Salazar continues to demonstrate potential for improvement and would benefit from continued skilled therapy to address impairments.     OBJECTIVE IMPAIRMENTS Abnormal gait, decreased endurance, decreased mobility, difficulty walking, decreased ROM, decreased strength, increased edema, increased fascial restrictions, increased muscle spasms, impaired flexibility, and pain.   ACTIVITY LIMITATIONS carrying, lifting, bending, sitting, standing, sleeping, stairs, transfers, and locomotion level  PARTICIPATION LIMITATIONS: driving, community activity, and yard work  [place] Past/current experiences, Transportation, and 3+ comorbidities: History chronic back pain, multiple back surgeries, L  knee replacement, DVT, CVA frontal lobe with memory deficits, seizures, open heart surgery to repair aneurism, CABG x 2 , ACDF, hernia repair,omental flap graft to mediastinum.  are also affecting patient's functional outcome.   REHAB POTENTIAL: Good  CLINICAL DECISION MAKING: Evolving/moderate complexity  EVALUATION COMPLEXITY: Moderate   GOALS: Goals reviewed with patient? Yes   SHORT TERM GOALS: Target date: 02/15/2022    Independent with initial HEP. Baseline: compliant with HEP from HHPT, able to verbalize and demonstrate exercises correctly, performs 3x/day.  Goal status: MET  2.  Pt. Will report 75% improvement in R knee  pain Baseline: constant 5-7/10 Goal status: IN PROGRESS   LONG TERM GOALS: Target date: 03/15/2022   Independent with advanced/ongoing HEP to improve outcomes and carryover.  Baseline: needs advancement Goal status: IN PROGRESS  2.  Erik Salazar will demonstrate R knee flexion  to 120 deg to ascend/descend stairs. Baseline: R knee flexion 110 passive, 100 active Goal status: MET  3.  Erik Salazar will demonstrate full R knee extension for safety with gait. Baseline: lacking 10 deg  Goal status: IN PROGRESS  4.  Erik Salazar will be able to ambulate 600' safely with LRAD and normal gait pattern to access community.  Baseline: using SPC, decreased gait speed.  Goal status: IN PROGRESS  5.  Erik Salazar will be able to ascend/descend stairs with 1 HR and reciprocal step pattern safely to access home and community.  Baseline: unable Goal status: IN PROGRESS  6.  Erik Salazar will demonstrate > 19/24 on DGI to demonstrate decreased risk of falls.   Baseline: NT today Goal status: IN PROGRESS  7.  Erik Salazar will demonstrate improved functional LE strength by completing 5x STS in <13 seconds without UE assist Baseline: 13.5 sec with UE assist.  Cannot stand without UE assist.  Goal status: IN PROGRESS  8.  Erik Salazar will demonstrate <40% disability on LEFS to demonstrate improved mobility.  Baseline: 40/80 = 50% disability Goal status: IN PROGRESS  9.  Erik Salazar will be able to return to walking 5K daily without limitation from R knee pain or LBP.  Baseline: unable Goal status: IN PROGRESS    PLAN: PT FREQUENCY: 2x/week  PT DURATION: 6 weeks  PLANNED INTERVENTIONS: Therapeutic exercises, Therapeutic activity, Neuromuscular re-education, Balance training, Gait training, Patient/Family education, Joint mobilization, Stair training, Dry Needling, Cryotherapy, Moist heat, scar mobilization, Vasopneumatic device, Ultrasound, and Manual therapy  PLAN FOR NEXT  SESSION: progress LE strengthening and ROM as tolerated, focus on knee extension, modalities PRN.  Also progress core strengthening as tolerated - prefers standing exercises.    Rennie Natter, PT, DPT  02/23/2022, 10:05 AM

## 2022-02-27 ENCOUNTER — Ambulatory Visit: Payer: Medicare HMO

## 2022-02-27 ENCOUNTER — Other Ambulatory Visit: Payer: Self-pay | Admitting: Physician Assistant

## 2022-02-27 DIAGNOSIS — R600 Localized edema: Secondary | ICD-10-CM

## 2022-02-27 DIAGNOSIS — R262 Difficulty in walking, not elsewhere classified: Secondary | ICD-10-CM

## 2022-02-27 DIAGNOSIS — M6281 Muscle weakness (generalized): Secondary | ICD-10-CM

## 2022-02-27 DIAGNOSIS — M25561 Pain in right knee: Secondary | ICD-10-CM

## 2022-02-27 DIAGNOSIS — M25661 Stiffness of right knee, not elsewhere classified: Secondary | ICD-10-CM

## 2022-02-27 NOTE — Therapy (Signed)
OUTPATIENT PHYSICAL THERAPY TREATMENT   Patient Name: Erik Salazar MRN: [id number] DOB:27-Nov-1950, 72 y.o., male Today's Date: 02/27/2022   PT End of Session - 02/27/22 0856     Visit Number 7    Number of Visits 12    Date for PT Re-Evaluation 03/15/22    Authorization Type [employer]    Progress Note Due on Visit 10    PT Start Time 0801    PT Stop Time 0858    PT Time Calculation (min) 57 min    Activity Tolerance Patient tolerated treatment well    Behavior During Therapy [facility] for tasks assessed/performed               Past Medical History:  Diagnosis Date   Arthritis    RA and OA   Carpal tunnel syndrome on right 07/08/2017   Cervical radiculopathy at C6 27/74/1287   Right   Complication of anesthesia    Coronary artery disease    Enlarged prostate    Fibromyalgia    H/O: knee surgery    15    History of blood clots    History of open heart surgery    ASCENDING AORTIC ANEURYSM   Ischemic stroke of frontal lobe ([place]) 03/14/2017   Bilateral; post-redo CT surgery   Pneumonia    PONV (postoperative nausea and vomiting)    only after CABG surgeries   Seizure disorder ([place]) 03/14/2017   Seizures ([place])    Status post knee surgery    DVT POST KNEE SURGERY   Stroke [facility])    Past Surgical History:  Procedure Laterality Date   ANTERIOR CERVICAL DECOMP/DISCECTOMY FUSION N/A 05/04/2020   Procedure: Anterior Cervical Decompression/Discectomy Fuion Cervical three-four, Cervical four-five, Cervical five-six;  Surgeon: Kary Kos, MD;  Location: [place];  Service: Neurosurgery;  Laterality: N/A;   BALLOON DILATION N/A 06/23/2019   Procedure: BALLOON DILATION;  Surgeon: Otis Brace, MD;  Location: [facility];  Service: Gastroenterology;  Laterality: N/A;   BENTALL PROCEDURE  01/04/2016   Bentall with 23 mm pericardial AVR; SVG-LAD, SVG-CX ([place])   BICEPS TENDON REPAIR Right    BIOPSY  06/23/2019   Procedure: BIOPSY;  Surgeon:  Otis Brace, MD;  Location: [facility];  Service: Gastroenterology;;   CARDIAC SURGERY     ANUERSYM MAY 2017   Bentall procedure. Bioprosthetic aortic valve #23 mm bovine model #2700 TF ask, and 28 mm Gelweave woven vascular sinus of Valsalva graft   CARPAL TUNNEL RELEASE  08/2018   right hand    CORONARY ARTERY BYPASS GRAFT  01/04/2016   VG to LAD & VG to LCX   CORONARY ARTERY BYPASS GRAFT  03/13/2017   LIMA to LAD with steril abcess removal from dacron graft   ESOPHAGOGASTRODUODENOSCOPY     Had done dilatation done about 2 or 3 times before in [place]   ESOPHAGOGASTRODUODENOSCOPY (EGD) WITH PROPOFOL N/A 06/23/2019   Procedure: ESOPHAGOGASTRODUODENOSCOPY (EGD) WITH PROPOFOL;  Surgeon: Otis Brace, MD;  [facility];  Service: Gastroenterology;  Laterality: N/A;   FALSE ANEURYSM REPAIR  03/13/2017   redo sternotomy, sterile abscess removal from Dacron graft, omental flap around aorta, CABG: LIMA-LAD ([name], Dr. Mart Piggs)   [place]  2018   Of pericardium 2018   INSERTION OF MESH  10/14/2020   Procedure: INSERTION OF MESH;  Surgeon: Michael Boston, MD;  Location: [place];  Service: General;;   knee surgeries     13 surgeries on knee done before  knee replacement    KNEE SURGERY  1983   LAPAROSCOPIC LYSIS OF ADHESIONS N/A 05/19/2021   Procedure: LYSIS OF ADHESIONS;  Surgeon: Michael Boston, MD;  Location: [place];  Service: General;  Laterality: N/A;  GEN & LOCAL   LYSIS OF ADHESION N/A 10/14/2020   Procedure: LYSIS OF ADHESION;  Surgeon: Michael Boston, MD;  Location: [place];  Service: General;  Laterality: N/A;   open heart surgery     03-13-2017   REPLACEMENT TOTAL KNEE Left 2015   ROTATOR CUFF REPAIR Right    done with biceps tendon repair   TONSILLECTOMY     removed as a child.   TOTAL KNEE ARTHROPLASTY Right 01/18/2022   Procedure: RIGHT TOTAL KNEE ARTHROPLASTY;  Surgeon: Leandrew Koyanagi, MD;  Location: [place];  Service: Orthopedics;  Laterality:  Right;   VENTRAL HERNIA REPAIR N/A 10/14/2020   Procedure: LAPAROSCOPIC VENTRAL HERNIA REPAIR WITH TAP BLOCK BILATERAL;  Surgeon: Michael Boston, MD;  Location: [place];  Service: General;  Laterality: N/A;   VENTRAL HERNIA REPAIR N/A 05/19/2021   Procedure: LAPAROSCOPIC VENTRAL WALL HERNIA REPAIR;  Surgeon: Michael Boston, MD;  Location: [place];  Service: General;  Laterality: N/A;   Patient Active Problem List   Diagnosis Date Noted   Gait abnormality 01/29/2022   Status post total right knee replacement 01/18/2022   Coronary artery disease involving native coronary artery of native heart without angina pectoris 01/04/2022   Preop cardiovascular exam 01/04/2022   Primary osteoarthritis of right knee 01/04/2022   Acute right-sided low back pain without sciatica 12/25/2021   Aortic atherosclerosis ([place]) 09/04/2021   Spondylolisthesis at L4-L5 level 06/28/2021   S/P repair of ventral hernia 05/19/2021   S/P repair of recurrent ventral hernia 05/19/2021   Benign prostatic hyperplasia without lower urinary tract symptoms 10/14/2020   Chronic pain 10/14/2020   ED (erectile dysfunction) of organic origin 10/14/2020   Esophageal dysphagia 10/14/2020   Gastroesophageal reflux disease 10/14/2020   History of aortic valve replacement with bioprosthetic valve 10/14/2020   Hx of aortic aneurysm repair 10/14/2020   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   Thoracic aortic aneurysm without rupture ([place]) 10/14/2020   Recurrent incisional hernia 10/14/2020   Spinal stenosis in cervical region 05/04/2020   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   Cervical radiculopathy at C6 11/14/2017   Carpal tunnel syndrome on right 07/08/2017   Tremor, essential 07/08/2017   Ischemic stroke of frontal lobe ([place]) 04/05/2017   Pain in right hand 04/05/2017   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 03/27/2017   Seizure ([place]) 03/14/2017   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/13/2017   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[place])  03/12/2017   Essential hypertension 02/26/2017   Mixed hyperlipidemia 02/26/2017    PCP: Marrian Salvage, FNP  REFERRING PROVIDER: Leandrew Koyanagi, MD  REFERRING DIAG: s/p R TKA  THERAPY DIAG:  Acute pain of right knee  Stiffness of right knee, not elsewhere classified  Difficulty in walking, not elsewhere classified  Localized edema  Muscle weakness (generalized)  Rationale for Evaluation and Treatment Rehabilitation  ONSET DATE: 01/18/2022  SUBJECTIVE:   SUBJECTIVE STATEMENT: The back has been the biggest problem, the knee has been improving well.  PERTINENT HISTORY: History chronic back pain, multiple back surgeries, L  knee replacement, DVT, CVA frontal lobe with memory deficits, seizures, open heart surgery to repair aneurism, CABG x 2 , ACDF, hernia repair,omental flap graft to mediastinum.  PAIN:  Are you having pain? Yes: NPRS scale:  12/10 Pain location: back, 3/10 R knee Pain description: constant ache, itchy Aggravating factors: bending Relieving factors: elevation, ice  PRECAUTIONS: None  WEIGHT BEARING RESTRICTIONS No  FALLS:  Has patient fallen in last 6 months? No  LIVING ENVIRONMENT: Lives with: lives with their spouse Lives in: House/apartment Stairs: No Has following equipment at home: Single point cane, Environmental consultant - 2 wheeled, and bed side commode  OCCUPATION: retired, exercises frequently  PLOF: [place] get back to normal, do 5k every morning on treadmill   OBJECTIVE:   DIAGNOSTIC FINDINGS: DG R knee 01/18/2022 FINDINGS: Right femoral and tibial components are well situated. Expected postoperative changes are seen in the soft tissues anteriorly.  PATIENT SURVEYS:  LEFS 40/80= 50% disability   COGNITION:  Overall cognitive status: Within functional limits for tasks assessed     SENSATION: WFL  EDEMA:  Circumferential: midcalf R 39.0 cm, L 36.5 cm   POSTURE: decreased lumbar lordosis  PALPATION: Noted  increased edema R calf, but no tenderness with palpation/squeeze test, no s/s of DVT.  Incision covered with bandage still.  No s/s of infection.  Bruising from nerve block R thigh.    LOWER EXTREMITY ROM:  Active ROM Right eval Left eval Right  02/09/22 Right  02/13/22 Right 02/27/22  Knee flexion 100 sitting 110 supine (passive) 122 sitting 130 supine 120 supine 125 supine 125  Knee extension 10 0 8 7 4    (Blank rows = not tested)  LOWER EXTREMITY MMT:  MMT Right eval Left eval  Hip flexion 3+ 5  Hip extension 4+ 4  Hip abduction 5 5  Hip adduction 5 5  Knee flexion 4+ 5  Knee extension 2+ quad lag 12 deg 5  Ankle dorsiflexion 5 5   (Blank rows = not tested)  HAMSTRING: measured through popliteal angle lacking 50 deg R, 20 deg L.   FUNCTIONAL TESTS:  5 times sit to stand: 13.5 sec with UE assist  GAIT: Distance walked: 86' Assistive device utilized: Single point cane Level of assistance: Modified independence Comments: antalgic, decreased stance time, heel strike on R, decreased gait speed 0.65 m/s, wide BOS    TODAY'S TREATMENT: 02/27/22 Therapeutic Exercise: to improve strength and mobility.  verbal and tactile cues throughout for technique.  MHP applied to LBP during bike and prone exercises for comfort.  Recumbent Bike L4x11mn Runner stretch 2x30" 5 way R SLS L LE reach 10x Staggered R SLS 3x10 sec hold Staggered R SLS with ant/post WS 2x10 Knee extension 25# 2x10 BLE Knee flexion 35# 2x10 BLE  Manual Therapy: STM to R medial hamstrings PROM with OP into knee extension Patellar mobs grade   Modalities: Game Ready post session - low compression, 34 deg, x 10 min  02/23/2022 Therapeutic Exercise: to improve strength and mobility.  Demo, verbal and tactile cues throughout for technique.  MHP applied to LBP during bike and prone exercises for comfort.  Bike L2 x 6 min   Standing HS stretch 2 x 30 sec at wall with PT externally rotating tibia to promote full  knee extension Bridges x 10, Bridges with RTB x 20  S/L clams RTB x 20 bil  Prone leg extensions x 20 bil - reports pulling in abdomen.  Manual Therapy: to decrease muscle spasm and pain and improve mobility IASTM with foam roller to bil QL, lumbar paraspinals in sitting, STM/TPR to lumbar paraspinals/SIJ bil, in prone over pillows skilled palpation and monitoring during dry needling. Trigger Point Dry-Needling  Treatment  instructions: Expect mild to moderate muscle soreness.  Patient verbalized understanding of these instructions and education. Patient Consent Given: Yes Education handout provided: Previously provided Muscles treated: bil glut med, piriformis Electrical stimulation performed:  Treatment response/outcome: Twitch Response Elicited and Palpable Increase in Muscle Length   02/16/2022 Therapeutic Exercise: to improve strength and mobility.  Demo, verbal and tactile cues throughout for technique. Bike L1 x 6 min seat 7 Standing HS stretch 2 x 30 sec at wall with PT externally rotating tibia to promote full knee extension TKE with GTB 3 x 10     Stretches: ITB 2X30 sec with strap, HS 2x 60 min, knee flex stretch with strap 2 x 60 sec Sit to stands 2 x 10 GTB to bias glutes Cybex knee flexion 20# 3 x 10  Cybex knee extension 20# 3 x 10  SAQ 2 x 15  Bridges 3 x 10  Manual Therapy: to improve ROM IR of femur with ER of tibia mobs x 10, followed by extension overpressure with IR femur/ER tibia to improve extension. Vasopneumatic: Gameready at end of session for post session soreness/edema.  10 min, low compression, 34 deg (coldest).  02/13/22 Therapeutic Exercise: to improve strength and mobility.  Demo, verbal and tactile cues throughout for technique. Bike L1 x 6 min seat 6    Stretches: ITB 2X30 sec with strap, HS 2x 60 min, knee flex stretch with strap 2 x 60 sec Sit to stands x 10 Gait x 180 ft using mirror for visual cues   SAQ 1 x 15 R Quad sets (heel elevated) x 5 5  sec hold R SLR 1 x 15 bil Bridges 2 x 15 Manual Therapy: to decrease muscle spasm and pain and improve mobility ITB MFR in SDLY Vasopneumatic: Gameready at end of session for post session soreness/edema.  10 min, low compression, 34 deg (coldest). In sitting with R foot propped on blue bolster on 8 inch stool for ext stretch.  PATIENT EDUCATION:  Education details: HEP Update 02/16/22 Person educated: Patient Education method: Explanation Education comprehension: verbalized understanding   HOME EXERCISE PROGRAM: Access Code: [email or web address]  ASSESSMENT:  CLINICAL IMPRESSION: Pt reports 60% improvement in R knee pain since starting PT. Today worked MT to improve knee extension to restore normal gait. Worked on SLS exercises to improve proprioception and kinesthetic awareness. Followed with strengthening to improve support for the knee joint. Pt notes most limiting pain in LB rather than R knee. He has shown improvement in knee extension ROM. Pt is progressing toward goals. Ended session with GR to address swelling and soreness post session.   OBJECTIVE IMPAIRMENTS Abnormal gait, decreased endurance, decreased mobility, difficulty walking, decreased ROM, decreased strength, increased edema, increased fascial restrictions, increased muscle spasms, impaired flexibility, and pain.   ACTIVITY LIMITATIONS carrying, lifting, bending, sitting, standing, sleeping, stairs, transfers, and locomotion level  PARTICIPATION LIMITATIONS: driving, community activity, and yard work  [place] Past/current experiences, Transportation, and 3+ comorbidities: History chronic back pain, multiple back surgeries, L  knee replacement, DVT, CVA frontal lobe with memory deficits, seizures, open heart surgery to repair aneurism, CABG x 2 , ACDF, hernia repair,omental flap graft to mediastinum.  are also affecting patient's functional outcome.   REHAB POTENTIAL: Good  CLINICAL DECISION MAKING: Evolving/moderate  complexity  EVALUATION COMPLEXITY: Moderate   GOALS: Goals reviewed with patient? Yes   SHORT TERM GOALS: Target date: 02/15/2022    Independent with initial HEP. Baseline: compliant with HEP from HHPT, able to verbalize and demonstrate  exercises correctly, performs 3x/day.  Goal status: MET  2.  Pt. Will report 75% improvement in R knee pain Baseline: constant 5-7/10 Goal status: IN PROGRESS - 60% improvement per pt (02/27/22)   LONG TERM GOALS: Target date: 03/15/2022   Independent with advanced/ongoing HEP to improve outcomes and carryover.  Baseline: needs advancement Goal status: IN PROGRESS  2.  Zailyn Torbert will demonstrate R knee flexion to 120 deg to ascend/descend stairs. Baseline: R knee flexion 110 passive, 100 active Goal status: MET  3.  Graciano Jackowski will demonstrate full R knee extension for safety with gait. Baseline: lacking 10 deg  Goal status: IN PROGRESS - 02/27/22- check under objective  4.  Whitney Andes will be able to ambulate 600' safely with LRAD and normal gait pattern to access community.  Baseline: using SPC, decreased gait speed.  Goal status: IN PROGRESS  5.  Mansoor Leinweber will be able to ascend/descend stairs with 1 HR and reciprocal step pattern safely to access home and community.  Baseline: unable Goal status: IN PROGRESS  6.  Joshau Retana will demonstrate > 19/24 on DGI to demonstrate decreased risk of falls.   Baseline: NT today Goal status: IN PROGRESS  7.  Ekansh Belvedere will demonstrate improved functional LE strength by completing 5x STS in <13 seconds without UE assist Baseline: 13.5 sec with UE assist.  Cannot stand without UE assist.  Goal status: IN PROGRESS  8.  Devonn Barriere will demonstrate <40% disability on LEFS to demonstrate improved mobility.  Baseline: 40/80 = 50% disability Goal status: IN PROGRESS  9.  Willet Idler will be able to return to walking 5K daily without limitation from R knee pain or LBP.   Baseline: unable Goal status: IN PROGRESS    PLAN: PT FREQUENCY: 2x/week  PT DURATION: 6 weeks  PLANNED INTERVENTIONS: Therapeutic exercises, Therapeutic activity, Neuromuscular re-education, Balance training, Gait training, Patient/Family education, Joint mobilization, Stair training, Dry Needling, Cryotherapy, Moist heat, scar mobilization, Vasopneumatic device, Ultrasound, and Manual therapy  PLAN FOR NEXT SESSION: progress LE strengthening and ROM as tolerated, focus on knee extension, modalities PRN.  Also progress core strengthening as tolerated - prefers standing exercises.    Artist Pais, PTA 02/27/2022, 9:06 AM

## 2022-03-01 ENCOUNTER — Ambulatory Visit: Payer: Medicare HMO | Admitting: Orthopaedic Surgery

## 2022-03-02 ENCOUNTER — Other Ambulatory Visit (HOSPITAL_BASED_OUTPATIENT_CLINIC_OR_DEPARTMENT_OTHER): Payer: Self-pay

## 2022-03-02 ENCOUNTER — Encounter: Payer: Self-pay | Admitting: Physical Therapy

## 2022-03-02 ENCOUNTER — Ambulatory Visit: Payer: Medicare HMO | Admitting: Physical Therapy

## 2022-03-02 DIAGNOSIS — M25661 Stiffness of right knee, not elsewhere classified: Secondary | ICD-10-CM

## 2022-03-02 DIAGNOSIS — R262 Difficulty in walking, not elsewhere classified: Secondary | ICD-10-CM

## 2022-03-02 DIAGNOSIS — R600 Localized edema: Secondary | ICD-10-CM

## 2022-03-02 DIAGNOSIS — M25561 Pain in right knee: Secondary | ICD-10-CM | POA: Diagnosis not present

## 2022-03-02 DIAGNOSIS — M6281 Muscle weakness (generalized): Secondary | ICD-10-CM

## 2022-03-02 NOTE — Therapy (Signed)
OUTPATIENT PHYSICAL THERAPY TREATMENT   Patient Name: Erik Salazar MRN: [id number] DOB:09-15-1950, 71 y.o., male Today's Date: 03/02/2022   PT End of Session - 03/02/22 0806     Visit Number 8    Number of Visits 12    Date for PT Re-Evaluation 03/15/22    Authorization Type [employer]    Progress Note Due on Visit 10    PT Start Time 0803    PT Stop Time 0847    PT Time Calculation (min) 44 min    Activity Tolerance Patient tolerated treatment well    Behavior During Therapy [facility] for tasks assessed/performed               Past Medical History:  Diagnosis Date   Arthritis    RA and OA   Carpal tunnel syndrome on right 07/08/2017   Cervical radiculopathy at C6 06/19/5944   Right   Complication of anesthesia    Coronary artery disease    Enlarged prostate    Fibromyalgia    H/O: knee surgery    15    History of blood clots    History of open heart surgery    ASCENDING AORTIC ANEURYSM   Ischemic stroke of frontal lobe ([place]) 03/14/2017   Bilateral; post-redo CT surgery   Pneumonia    PONV (postoperative nausea and vomiting)    only after CABG surgeries   Seizure disorder ([place]) 03/14/2017   Seizures ([place])    Status post knee surgery    DVT POST KNEE SURGERY   Stroke [facility])    Past Surgical History:  Procedure Laterality Date   ANTERIOR CERVICAL DECOMP/DISCECTOMY FUSION N/A 05/04/2020   Procedure: Anterior Cervical Decompression/Discectomy Fuion Cervical three-four, Cervical four-five, Cervical five-six;  Surgeon: Kary Kos, MD;  Location: [place];  Service: Neurosurgery;  Laterality: N/A;   BALLOON DILATION N/A 06/23/2019   Procedure: BALLOON DILATION;  Surgeon: Otis Brace, MD;  Location: [facility];  Service: Gastroenterology;  Laterality: N/A;   BENTALL PROCEDURE  01/04/2016   Bentall with 23 mm pericardial AVR; SVG-LAD, SVG-CX ([place])   BICEPS TENDON REPAIR Right    BIOPSY  06/23/2019   Procedure: BIOPSY;  Surgeon:  Otis Brace, MD;  Location: [facility];  Service: Gastroenterology;;   CARDIAC SURGERY     ANUERSYM MAY 2017   Bentall procedure. Bioprosthetic aortic valve #23 mm bovine model #2700 TF ask, and 28 mm Gelweave woven vascular sinus of Valsalva graft   CARPAL TUNNEL RELEASE  08/2018   right hand    CORONARY ARTERY BYPASS GRAFT  01/04/2016   VG to LAD & VG to LCX   CORONARY ARTERY BYPASS GRAFT  03/13/2017   LIMA to LAD with steril abcess removal from dacron graft   ESOPHAGOGASTRODUODENOSCOPY     Had done dilatation done about 2 or 3 times before in [place]   ESOPHAGOGASTRODUODENOSCOPY (EGD) WITH PROPOFOL N/A 06/23/2019   Procedure: ESOPHAGOGASTRODUODENOSCOPY (EGD) WITH PROPOFOL;  Surgeon: Otis Brace, MD;  Location: [facility];  Service: Gastroenterology;  Laterality: N/A;   FALSE ANEURYSM REPAIR  03/13/2017   redo sternotomy, sterile abscess removal from Dacron graft, omental flap around aorta, CABG: LIMA-LAD ([name], Dr. Mart Piggs)   [place]  2018   Of pericardium 2018   INSERTION OF MESH  10/14/2020   Procedure: INSERTION OF MESH;  Surgeon: Michael Boston, MD;  Location: [place];  Service: General;;   knee surgeries     13 surgeries on knee done before  knee replacement    KNEE SURGERY  1983   LAPAROSCOPIC LYSIS OF ADHESIONS N/A 05/19/2021   Procedure: LYSIS OF ADHESIONS;  Surgeon: Michael Boston, MD;  Location: [place];  Service: General;  Laterality: N/A;  GEN & LOCAL   LYSIS OF ADHESION N/A 10/14/2020   Procedure: LYSIS OF ADHESION;  Surgeon: Michael Boston, MD;  Location: [place];  Service: General;  Laterality: N/A;   open heart surgery     03-13-2017   REPLACEMENT TOTAL KNEE Left 2015   ROTATOR CUFF REPAIR Right    done with biceps tendon repair   TONSILLECTOMY     removed as a child.   TOTAL KNEE ARTHROPLASTY Right 01/18/2022   Procedure: RIGHT TOTAL KNEE ARTHROPLASTY;  Surgeon: Leandrew Koyanagi, MD;  Location: [place];  Service: Orthopedics;  Laterality:  Right;   VENTRAL HERNIA REPAIR N/A 10/14/2020   Procedure: LAPAROSCOPIC VENTRAL HERNIA REPAIR WITH TAP BLOCK BILATERAL;  Surgeon: Michael Boston, MD;  Location: [place];  Service: General;  Laterality: N/A;   VENTRAL HERNIA REPAIR N/A 05/19/2021   Procedure: LAPAROSCOPIC VENTRAL WALL HERNIA REPAIR;  Surgeon: Michael Boston, MD;  Location: [place];  Service: General;  Laterality: N/A;   Patient Active Problem List   Diagnosis Date Noted   Gait abnormality 01/29/2022   Status post total right knee replacement 01/18/2022   Coronary artery disease involving native coronary artery of native heart without angina pectoris 01/04/2022   Preop cardiovascular exam 01/04/2022   Primary osteoarthritis of right knee 01/04/2022   Acute right-sided low back pain without sciatica 12/25/2021   Aortic atherosclerosis ([place]) 09/04/2021   Spondylolisthesis at L4-L5 level 06/28/2021   S/P repair of ventral hernia 05/19/2021   S/P repair of recurrent ventral hernia 05/19/2021   Benign prostatic hyperplasia without lower urinary tract symptoms 10/14/2020   Chronic pain 10/14/2020   ED (erectile dysfunction) of organic origin 10/14/2020   Esophageal dysphagia 10/14/2020   Gastroesophageal reflux disease 10/14/2020   History of aortic valve replacement with bioprosthetic valve 10/14/2020   Hx of aortic aneurysm repair 10/14/2020   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   Thoracic aortic aneurysm without rupture ([place]) 10/14/2020   Recurrent incisional hernia 10/14/2020   Spinal stenosis in cervical region 05/04/2020   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   Cervical radiculopathy at C6 11/14/2017   Carpal tunnel syndrome on right 07/08/2017   Tremor, essential 07/08/2017   Ischemic stroke of frontal lobe ([place]) 04/05/2017   Pain in right hand 04/05/2017   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 03/27/2017   Seizure ([place]) 03/14/2017   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/13/2017   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[place])  03/12/2017   Essential hypertension 02/26/2017   Mixed hyperlipidemia 02/26/2017    PCP: Marrian Salvage, FNP  REFERRING PROVIDER: Leandrew Koyanagi, MD  REFERRING DIAG: s/p R TKA  THERAPY DIAG:  Acute pain of right knee  Stiffness of right knee, not elsewhere classified  Difficulty in walking, not elsewhere classified  Localized edema  Muscle weakness (generalized)  Rationale for Evaluation and Treatment Rehabilitation  ONSET DATE: 01/18/2022  SUBJECTIVE:   SUBJECTIVE STATEMENT: The back has been "barking" all the time, wakes him at night. Knee doesn't hurt, just "uncomfortable."  PERTINENT HISTORY: History chronic back pain, multiple back surgeries, L  knee replacement, DVT, CVA frontal lobe with memory deficits, seizures, open heart surgery to repair aneurism, CABG x 2 , ACDF, hernia repair,omental flap graft to mediastinum.  PAIN:  Are you having  pain? Yes: NPRS scale: 10/10 Pain location: back Pain description: constant ache  PRECAUTIONS: None  WEIGHT BEARING RESTRICTIONS No  FALLS:  Has patient fallen in last 6 months? No  LIVING ENVIRONMENT: Lives with: lives with their spouse Lives in: House/apartment Stairs: No Has following equipment at home: Single point cane, Environmental consultant - 2 wheeled, and bed side commode  OCCUPATION: retired, exercises frequently  PLOF: [place] get back to normal, do 5k every morning on treadmill   OBJECTIVE:   DIAGNOSTIC FINDINGS: DG R knee 01/18/2022 FINDINGS: Right femoral and tibial components are well situated. Expected postoperative changes are seen in the soft tissues anteriorly.  PATIENT SURVEYS:  LEFS 40/80= 50% disability   COGNITION:  Overall cognitive status: Within functional limits for tasks assessed     SENSATION: WFL  EDEMA:  Circumferential: midcalf R 39.0 cm, L 36.5 cm   POSTURE: decreased lumbar lordosis  PALPATION: Noted increased edema R calf, but no tenderness with  palpation/squeeze test, no s/s of DVT.  Incision covered with bandage still.  No s/s of infection.  Bruising from nerve block R thigh.    LOWER EXTREMITY ROM:  Active ROM Right eval Left eval Right  02/09/22 Right  02/13/22 Right 02/27/22  Knee flexion 100 sitting 110 supine (passive) 122 sitting 130 supine 120 supine 125 supine 125  Knee extension 10 0 8 7 4    (Blank rows = not tested)  LOWER EXTREMITY MMT:  MMT Right eval Left eval  Hip flexion 3+ 5  Hip extension 4+ 4  Hip abduction 5 5  Hip adduction 5 5  Knee flexion 4+ 5  Knee extension 2+ quad lag 12 deg 5  Ankle dorsiflexion 5 5   (Blank rows = not tested)  HAMSTRING: measured through popliteal angle lacking 50 deg R, 20 deg L.   FUNCTIONAL TESTS:  5 times sit to stand: 13.5 sec with UE assist  GAIT: Distance walked: 4' Assistive device utilized: Single point cane Level of assistance: Modified independence Comments: antalgic, decreased stance time, heel strike on R, decreased gait speed 0.65 m/s, wide BOS    TODAY'S TREATMENT: 03/02/2022 Therapeutic Exercise: to improve strength and mobility.  MHP applied to LB during bike and supine exercises.  Bike L3 x 6 min  Cybex leg extension 25# 2 x 15 BLE Cybex leg flexion 25# 2 x 15 BLE Bridges x 10 Single leg bridges x 20 bil S/l hip abduction x 20 bil - cues to keep leg back S/l clams RTB x 20 bil  ITBand stretch in supine with strap R 3 x 30 sec  Review of HEP and update.  Manual Therapy: to decrease muscle spasm and pain and improve mobility STM/TPR to bil QL  02/27/22 Therapeutic Exercise: to improve strength and mobility.  verbal and tactile cues throughout for technique.  MHP applied to LBP during bike and prone exercises for comfort.  Recumbent Bike L4x70mn Runner stretch 2x30" 5 way R SLS L LE reach 10x Staggered R SLS 3x10 sec hold Staggered R SLS with ant/post WS 2x10 Knee extension 25# 2x10 BLE Knee flexion 35# 2x10 BLE  Manual Therapy: STM  to R medial hamstrings PROM with OP into knee extension Patellar mobs grade   Modalities: Game Ready post session - low compression, 34 deg, x 10 min  02/23/2022 Therapeutic Exercise: to improve strength and mobility.  Demo, verbal and tactile cues throughout for technique.  MHP applied to LBP during bike and prone exercises for comfort.  Bike  L2 x 6 min   Standing HS stretch 2 x 30 sec at wall with PT externally rotating tibia to promote full knee extension Bridges x 10, Bridges with RTB x 20  S/L clams RTB x 20 bil  Prone leg extensions x 20 bil - reports pulling in abdomen.  Manual Therapy: to decrease muscle spasm and pain and improve mobility IASTM with foam roller to bil QL, lumbar paraspinals in sitting, STM/TPR to lumbar paraspinals/SIJ bil, in prone over pillows skilled palpation and monitoring during dry needling. Trigger Point Dry-Needling  Treatment instructions: Expect mild to moderate muscle soreness.  Patient verbalized understanding of these instructions and education. Patient Consent Given: Yes Education handout provided: Previously provided Muscles treated: bil glut med, piriformis Electrical stimulation performed:  Treatment response/outcome: Twitch Response Elicited and Palpable Increase in Muscle Length    PATIENT EDUCATION:  Education details: HEP Update 02/16/22 Person educated: Patient Education method: Explanation Education comprehension: verbalized understanding   HOME EXERCISE PROGRAM: Access Code: [email or web address]  ASSESSMENT:  CLINICAL IMPRESSION: Pt reports continued concern for low back rather than knee. Also reports confusion about which exercises needs to be performing at this point - has been performing all exercises including ones given at hospital still.  Reviewed and progressed HEP for knee and core strengthening.  Discussed dry needling QL again as this was helpful in past with back pain, pt. Preferred just deep tissue/TPR today and will consider next  session. Reported decreased pain following manual therapy.  Supreme [place]ian continues to demonstrate potential for improvement and would benefit from continued skilled therapy to address impairments.       OBJECTIVE IMPAIRMENTS Abnormal gait, decreased endurance, decreased mobility, difficulty walking, decreased ROM, decreased strength, increased edema, increased fascial restrictions, increased muscle spasms, impaired flexibility, and pain.   ACTIVITY LIMITATIONS carrying, lifting, bending, sitting, standing, sleeping, stairs, transfers, and locomotion level  PARTICIPATION LIMITATIONS: driving, community activity, and yard work  [place] Past/current experiences, Transportation, and 3+ comorbidities: History chronic back pain, multiple back surgeries, L  knee replacement, DVT, CVA frontal lobe with memory deficits, seizures, open heart surgery to repair aneurism, CABG x 2 , ACDF, hernia repair,omental flap graft to mediastinum.  are also affecting patient's functional outcome.   REHAB POTENTIAL: Good  CLINICAL DECISION MAKING: Evolving/moderate complexity  EVALUATION COMPLEXITY: Moderate   GOALS: Goals reviewed with patient? Yes   SHORT TERM GOALS: Target date: 02/15/2022    Independent with initial HEP. Baseline: compliant with HEP from HHPT, able to verbalize and demonstrate exercises correctly, performs 3x/day.  Goal status: MET  2.  Pt. Will report 75% improvement in R knee pain Baseline: constant 5-7/10 Goal status: IN PROGRESS - 60% improvement per pt (02/27/22)   LONG TERM GOALS: Target date: 03/15/2022   Independent with advanced/ongoing HEP to improve outcomes and carryover.  Baseline: needs advancement Goal status: IN PROGRESS  2.  Yan Binegar will demonstrate R knee flexion to 120 deg to ascend/descend stairs. Baseline: R knee flexion 110 passive, 100 active Goal status: MET  3.  Braedon Fredman will demonstrate full R knee extension for safety with  gait. Baseline: lacking 10 deg  Goal status: IN PROGRESS - 02/27/22- check under objective  4.  Coal Alvis will be able to ambulate 600' safely with LRAD and normal gait pattern to access community.  Baseline: using SPC, decreased gait speed.  Goal status: IN PROGRESS  5.  Deklen Myrie will be able to ascend/descend stairs with 1 HR and reciprocal step pattern safely to  access home and community.  Baseline: unable Goal status: IN PROGRESS  6.  Alvia Perfetti will demonstrate > 19/24 on DGI to demonstrate decreased risk of falls.   Baseline: NT today Goal status: IN PROGRESS  7.  Laith Mallek will demonstrate improved functional LE strength by completing 5x STS in <13 seconds without UE assist Baseline: 13.5 sec with UE assist.  Cannot stand without UE assist.  Goal status: IN PROGRESS  8.  Aydien Bisping will demonstrate <40% disability on LEFS to demonstrate improved mobility.  Baseline: 40/80 = 50% disability Goal status: IN PROGRESS  9.  Arnol Poinsett will be able to return to walking 5K daily without limitation from R knee pain or LBP.  Baseline: unable Goal status: IN PROGRESS    PLAN: PT FREQUENCY: 2x/week  PT DURATION: 6 weeks  PLANNED INTERVENTIONS: Therapeutic exercises, Therapeutic activity, Neuromuscular re-education, Balance training, Gait training, Patient/Family education, Joint mobilization, Stair training, Dry Needling, Cryotherapy, Moist heat, scar mobilization, Vasopneumatic device, Ultrasound, and Manual therapy  PLAN FOR NEXT SESSION: progress LE strengthening and ROM as tolerated, focus on knee extension, modalities PRN.  Also progress core strengthening as tolerated - prefers standing exercises.   DN to QL.    Rennie Natter, PT, DPT  03/02/2022, 9:12 AM

## 2022-03-05 ENCOUNTER — Other Ambulatory Visit (HOSPITAL_BASED_OUTPATIENT_CLINIC_OR_DEPARTMENT_OTHER): Payer: Self-pay

## 2022-03-06 ENCOUNTER — Ambulatory Visit: Payer: Medicare HMO

## 2022-03-06 ENCOUNTER — Other Ambulatory Visit (HOSPITAL_BASED_OUTPATIENT_CLINIC_OR_DEPARTMENT_OTHER): Payer: Self-pay

## 2022-03-06 DIAGNOSIS — R600 Localized edema: Secondary | ICD-10-CM

## 2022-03-06 DIAGNOSIS — M25661 Stiffness of right knee, not elsewhere classified: Secondary | ICD-10-CM

## 2022-03-06 DIAGNOSIS — M25561 Pain in right knee: Secondary | ICD-10-CM | POA: Diagnosis not present

## 2022-03-06 DIAGNOSIS — R262 Difficulty in walking, not elsewhere classified: Secondary | ICD-10-CM

## 2022-03-06 NOTE — Therapy (Signed)
OUTPATIENT PHYSICAL THERAPY TREATMENT   Patient Name: Erik Salazar MRN: [id number] DOB:10/27/1950, 71 y.o., male Today's Date: 03/06/2022   PT End of Session - 03/06/22 0849     Visit Number 9    Number of Visits 12    Date for PT Re-Evaluation 03/15/22    Authorization Type [employer]    Progress Note Due on Visit 10    PT Start Time 0801    PT Stop Time 0855    PT Time Calculation (min) 54 min    Activity Tolerance Patient tolerated treatment well    Behavior During Therapy [facility] for tasks assessed/performed                Past Medical History:  Diagnosis Date   Arthritis    RA and OA   Carpal tunnel syndrome on right 07/08/2017   Cervical radiculopathy at C6 73/53/2992   Right   Complication of anesthesia    Coronary artery disease    Enlarged prostate    Fibromyalgia    H/O: knee surgery    15    History of blood clots    History of open heart surgery    ASCENDING AORTIC ANEURYSM   Ischemic stroke of frontal lobe ([place]) 03/14/2017   Bilateral; post-redo CT surgery   Pneumonia    PONV (postoperative nausea and vomiting)    only after CABG surgeries   Seizure disorder ([place]) 03/14/2017   Seizures ([place])    Status post knee surgery    DVT POST KNEE SURGERY   Stroke [facility])    Past Surgical History:  Procedure Laterality Date   ANTERIOR CERVICAL DECOMP/DISCECTOMY FUSION N/A 05/04/2020   Procedure: Anterior Cervical Decompression/Discectomy Fuion Cervical three-four, Cervical four-five, Cervical five-six;  Surgeon: Kary Kos, MD;  Location: [place];  Service: Neurosurgery;  Laterality: N/A;   BALLOON DILATION N/A 06/23/2019   Procedure: BALLOON DILATION;  Surgeon: Otis Brace, MD;  Location: [facility];  Service: Gastroenterology;  Laterality: N/A;   BENTALL PROCEDURE  01/04/2016   Bentall with 23 mm pericardial AVR; SVG-LAD, SVG-CX ([place])   BICEPS TENDON REPAIR Right    BIOPSY  06/23/2019   Procedure: BIOPSY;  Surgeon:  Otis Brace, MD;  Location: [facility];  Service: Gastroenterology;;   CARDIAC SURGERY     ANUERSYM MAY 2017   Bentall procedure. Bioprosthetic aortic valve #23 mm bovine model #2700 TF ask, and 28 mm Gelweave woven vascular sinus of Valsalva graft   CARPAL TUNNEL RELEASE  08/2018   right hand    CORONARY ARTERY BYPASS GRAFT  01/04/2016   VG to LAD & VG to LCX   CORONARY ARTERY BYPASS GRAFT  03/13/2017   LIMA to LAD with steril abcess removal from dacron graft   ESOPHAGOGASTRODUODENOSCOPY     Had done dilatation done about 2 or 3 times before in [place]   ESOPHAGOGASTRODUODENOSCOPY (EGD) WITH PROPOFOL N/A 06/23/2019   Procedure: ESOPHAGOGASTRODUODENOSCOPY (EGD) WITH PROPOFOL;  Surgeon: Otis Brace, MD;  [facility];  Service: Gastroenterology;  Laterality: N/A;   FALSE ANEURYSM REPAIR  03/13/2017   redo sternotomy, sterile abscess removal from Dacron graft, omental flap around aorta, CABG: LIMA-LAD ([name], Dr. Mart Piggs)   [place]  2018   Of pericardium 2018   INSERTION OF MESH  10/14/2020   Procedure: INSERTION OF MESH;  Surgeon: Michael Boston, MD;  Location: [place] [facility];  Service: General;;   knee surgeries     13 surgeries on knee done  before knee replacement    KNEE SURGERY  1983   LAPAROSCOPIC LYSIS OF ADHESIONS N/A 05/19/2021   Procedure: LYSIS OF ADHESIONS;  Surgeon: Michael Boston, MD;  Location: [place];  Service: General;  Laterality: N/A;  GEN & LOCAL   LYSIS OF ADHESION N/A 10/14/2020   Procedure: LYSIS OF ADHESION;  Surgeon: Michael Boston, MD;  Location: [place];  Service: General;  Laterality: N/A;   open heart surgery     03-13-2017   REPLACEMENT TOTAL KNEE Left 2015   ROTATOR CUFF REPAIR Right    done with biceps tendon repair   TONSILLECTOMY     removed as a child.   TOTAL KNEE ARTHROPLASTY Right 01/18/2022   Procedure: RIGHT TOTAL KNEE ARTHROPLASTY;  Surgeon: Leandrew Koyanagi, MD;  Location: [place];  Service: Orthopedics;  Laterality:  Right;   VENTRAL HERNIA REPAIR N/A 10/14/2020   Procedure: LAPAROSCOPIC VENTRAL HERNIA REPAIR WITH TAP BLOCK BILATERAL;  Surgeon: Michael Boston, MD;  Location: [place];  Service: General;  Laterality: N/A;   VENTRAL HERNIA REPAIR N/A 05/19/2021   Procedure: LAPAROSCOPIC VENTRAL WALL HERNIA REPAIR;  Surgeon: Michael Boston, MD;  Location: [place];  Service: General;  Laterality: N/A;   Patient Active Problem List   Diagnosis Date Noted   Gait abnormality 01/29/2022   Status post total right knee replacement 01/18/2022   Coronary artery disease involving native coronary artery of native heart without angina pectoris 01/04/2022   Preop cardiovascular exam 01/04/2022   Primary osteoarthritis of right knee 01/04/2022   Acute right-sided low back pain without sciatica 12/25/2021   Aortic atherosclerosis ([place]) 09/04/2021   Spondylolisthesis at L4-L5 level 06/28/2021   S/P repair of ventral hernia 05/19/2021   S/P repair of recurrent ventral hernia 05/19/2021   Benign prostatic hyperplasia without lower urinary tract symptoms 10/14/2020   Chronic pain 10/14/2020   ED (erectile dysfunction) of organic origin 10/14/2020   Esophageal dysphagia 10/14/2020   Gastroesophageal reflux disease 10/14/2020   History of aortic valve replacement with bioprosthetic valve 10/14/2020   Hx of aortic aneurysm repair 10/14/2020   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   Thoracic aortic aneurysm without rupture ([place]) 10/14/2020   Recurrent incisional hernia 10/14/2020   Spinal stenosis in cervical region 05/04/2020   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   Cervical radiculopathy at C6 11/14/2017   Carpal tunnel syndrome on right 07/08/2017   Tremor, essential 07/08/2017   Ischemic stroke of frontal lobe ([place]) 04/05/2017   Pain in right hand 04/05/2017   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 03/27/2017   Seizure ([place]) 03/14/2017   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/13/2017   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[place])  03/12/2017   Essential hypertension 02/26/2017   Mixed hyperlipidemia 02/26/2017    PCP: Marrian Salvage, FNP  REFERRING PROVIDER: Leandrew Koyanagi, MD  REFERRING DIAG: s/p R TKA  THERAPY DIAG:  Acute pain of right knee  Stiffness of right knee, not elsewhere classified  Difficulty in walking, not elsewhere classified  Localized edema  Rationale for Evaluation and Treatment Rehabilitation  ONSET DATE: 01/18/2022  SUBJECTIVE:   SUBJECTIVE STATEMENT: Pt notes that last night his knee popped in bed while sleeping, woke up and noticed a bubble on anteromedial knee.  PERTINENT HISTORY: History chronic back pain, multiple back surgeries, L  knee replacement, DVT, CVA frontal lobe with memory deficits, seizures, open heart surgery to repair aneurism, CABG x 2 , ACDF, hernia repair,omental flap graft to mediastinum.  PAIN:  Are you  having pain? Yes: NPRS scale: 3/10 Pain location: back Pain description: constant ache  PRECAUTIONS: None  WEIGHT BEARING RESTRICTIONS No  FALLS:  Has patient fallen in last 6 months? No  LIVING ENVIRONMENT: Lives with: lives with their spouse Lives in: House/apartment Stairs: No Has following equipment at home: Single point cane, Environmental consultant - 2 wheeled, and bed side commode  OCCUPATION: retired, exercises frequently  PLOF: [place] get back to normal, do 5k every morning on treadmill   OBJECTIVE:   DIAGNOSTIC FINDINGS: DG R knee 01/18/2022 FINDINGS: Right femoral and tibial components are well situated. Expected postoperative changes are seen in the soft tissues anteriorly.  PATIENT SURVEYS:  LEFS 40/80= 50% disability   COGNITION:  Overall cognitive status: Within functional limits for tasks assessed     SENSATION: WFL  EDEMA:  Circumferential: midcalf R 39.0 cm, L 36.5 cm   POSTURE: decreased lumbar lordosis  PALPATION: Noted increased edema R calf, but no tenderness with palpation/squeeze test, no s/s  of DVT.  Incision covered with bandage still.  No s/s of infection.  Bruising from nerve block R thigh.    LOWER EXTREMITY ROM:  Active ROM Right eval Left eval Right  02/09/22 Right  02/13/22 Right 02/27/22 Right 03/06/22  Knee flexion 100 sitting 110 supine (passive) 122 sitting 130 supine 120 supine 125 supine 125   Knee extension 10 0 8 7 4 4     (Blank rows = not tested)  LOWER EXTREMITY MMT:  MMT Right eval Left eval  Hip flexion 3+ 5  Hip extension 4+ 4  Hip abduction 5 5  Hip adduction 5 5  Knee flexion 4+ 5  Knee extension 2+ quad lag 12 deg 5  Ankle dorsiflexion 5 5   (Blank rows = not tested)  HAMSTRING: measured through popliteal angle lacking 50 deg R, 20 deg L.   FUNCTIONAL TESTS:  5 times sit to stand: 13.5 sec with UE assist  GAIT: Distance walked: 31' Assistive device utilized: Single point cane Level of assistance: Modified independence Comments: antalgic, decreased stance time, heel strike on R, decreased gait speed 0.65 m/s, wide BOS    TODAY'S TREATMENT: 03/06/22 Therapeutic Exercise: Rec Bike L1x42mn Heel slides with foot on peanut ball 10x5" and strap SLR R 2x10 with QS Knee extension 20# 2x15 BLE, RLE 5# x 10 Knee flexion 10# RLE x 10, 25# 2x15 BLE  Manual Therapy: Passive hamstring stretch x 30 sec PROM into knee extension  03/02/2022 Therapeutic Exercise: to improve strength and mobility.  MHP applied to LB during bike and supine exercises.  Bike L3 x 6 min  Cybex leg extension 25# 2 x 15 BLE Cybex leg flexion 25# 2 x 15 BLE Bridges x 10 Single leg bridges x 20 bil S/l hip abduction x 20 bil - cues to keep leg back S/l clams RTB x 20 bil  ITBand stretch in supine with strap R 3 x 30 sec  Review of HEP and update.  Manual Therapy: to decrease muscle spasm and pain and improve mobility STM/TPR to bil QL  02/27/22 Therapeutic Exercise: to improve strength and mobility.  verbal and tactile cues throughout for technique.  MHP applied  to LBP during bike and prone exercises for comfort.  Recumbent Bike L4x632m Runner stretch 2x30" 5 way R SLS L LE reach 10x Staggered R SLS 3x10 sec hold Staggered R SLS with ant/post WS 2x10 Knee extension 25# 2x10 BLE Knee flexion 35# 2x10 BLE  Manual Therapy: STM to  R medial hamstrings PROM with OP into knee extension Patellar mobs grade   Modalities: Game Ready post session - low compression, 34 deg, x 10 min  PATIENT EDUCATION:  Education details: HEP Update 02/16/22 Person educated: Patient Education method: Explanation Education comprehension: verbalized understanding   HOME EXERCISE PROGRAM: Access Code: [facility]  ASSESSMENT:  CLINICAL IMPRESSION: Erik Salazar noted last night that while sleeping he felt his knee pop and later on was accompanied by swelling. He presented with the swelling on the anteromedial knee this morning, it was not painful.  He was able to participate in all interventions w/o pain. He has met STG#2 today. Knee extension was still at 4 deg today. Ended session with GR to address swelling and soreness in R knee.     OBJECTIVE IMPAIRMENTS Abnormal gait, decreased endurance, decreased mobility, difficulty walking, decreased ROM, decreased strength, increased edema, increased fascial restrictions, increased muscle spasms, impaired flexibility, and pain.   ACTIVITY LIMITATIONS carrying, lifting, bending, sitting, standing, sleeping, stairs, transfers, and locomotion level  PARTICIPATION LIMITATIONS: driving, community activity, and yard work  [place] Past/current experiences, Transportation, and 3+ comorbidities: History chronic back pain, multiple back surgeries, L  knee replacement, DVT, CVA frontal lobe with memory deficits, seizures, open heart surgery to repair aneurism, CABG x 2 , ACDF, hernia repair,omental flap graft to mediastinum.  are also affecting patient's functional outcome.   REHAB POTENTIAL: Good  CLINICAL DECISION MAKING:  Evolving/moderate complexity  EVALUATION COMPLEXITY: Moderate   GOALS: Goals reviewed with patient? Yes   SHORT TERM GOALS: Target date: 02/15/2022    Independent with initial HEP. Baseline: compliant with HEP from HHPT, able to verbalize and demonstrate exercises correctly, performs 3x/day.  Goal status: MET  2.  Pt. Will report 75% improvement in R knee pain Baseline: constant 5-7/10 Goal status: MET - 80% improvement per pt (03/06/22)   LONG TERM GOALS: Target date: 03/15/2022   Independent with advanced/ongoing HEP to improve outcomes and carryover.  Baseline: needs advancement Goal status: IN PROGRESS  2.  Erik Salazar will demonstrate R knee flexion to 120 deg to ascend/descend stairs. Baseline: R knee flexion 110 passive, 100 active Goal status: MET  3.  Erik Salazar will demonstrate full R knee extension for safety with gait. Baseline: lacking 10 deg  Goal status: IN PROGRESS - 03/06/22- check under objective  4.  Erik Salazar will be able to ambulate 600' safely with LRAD and normal gait pattern to access community.  Baseline: using SPC, decreased gait speed.  Goal status: IN PROGRESS  5.  Erik Salazar will be able to ascend/descend stairs with 1 HR and reciprocal step pattern safely to access home and community.  Baseline: unable Goal status: IN PROGRESS  6.  Erik Salazar will demonstrate > 19/24 on DGI to demonstrate decreased risk of falls.   Baseline: NT today Goal status: IN PROGRESS  7.  Erik Salazar will demonstrate improved functional LE strength by completing 5x STS in <13 seconds without UE assist Baseline: 13.5 sec with UE assist.  Cannot stand without UE assist.  Goal status: IN PROGRESS  8.  Erik Salazar will demonstrate <40% disability on LEFS to demonstrate improved mobility.  Baseline: 40/80 = 50% disability Goal status: IN PROGRESS  9.  Erik Salazar will be able to return to walking 5K daily without limitation from R knee pain or LBP.   Baseline: unable Goal status: IN PROGRESS    PLAN: PT FREQUENCY: 2x/week  PT DURATION: 6 weeks  PLANNED INTERVENTIONS: Therapeutic exercises,  Therapeutic activity, Neuromuscular re-education, Balance training, Gait training, Patient/Family education, Joint mobilization, Stair training, Dry Needling, Cryotherapy, Moist heat, scar mobilization, Vasopneumatic device, Ultrasound, and Manual therapy  PLAN FOR NEXT SESSION: progress LE strengthening and ROM as tolerated, focus on knee extension, modalities PRN.  Also progress core strengthening as tolerated - prefers standing exercises.   DN to QL.    Artist Pais, PTA 03/06/2022, 8:50 AM

## 2022-03-09 ENCOUNTER — Ambulatory Visit: Payer: Medicare HMO | Admitting: Physical Therapy

## 2022-03-09 ENCOUNTER — Encounter: Payer: Self-pay | Admitting: Physical Therapy

## 2022-03-09 ENCOUNTER — Other Ambulatory Visit (HOSPITAL_BASED_OUTPATIENT_CLINIC_OR_DEPARTMENT_OTHER): Payer: Self-pay

## 2022-03-09 DIAGNOSIS — M25561 Pain in right knee: Secondary | ICD-10-CM

## 2022-03-09 DIAGNOSIS — M6281 Muscle weakness (generalized): Secondary | ICD-10-CM

## 2022-03-09 DIAGNOSIS — R252 Cramp and spasm: Secondary | ICD-10-CM

## 2022-03-09 DIAGNOSIS — R600 Localized edema: Secondary | ICD-10-CM

## 2022-03-09 DIAGNOSIS — M25661 Stiffness of right knee, not elsewhere classified: Secondary | ICD-10-CM

## 2022-03-09 DIAGNOSIS — R262 Difficulty in walking, not elsewhere classified: Secondary | ICD-10-CM

## 2022-03-09 DIAGNOSIS — G8929 Other chronic pain: Secondary | ICD-10-CM

## 2022-03-09 NOTE — Therapy (Signed)
OUTPATIENT PHYSICAL THERAPY TREATMENT Progress Note Reporting Period 02/01/2022 to 03/09/2022  See note below for Objective Data and Assessment of Progress/Goals.    Patient Name: Erik Salazar MRN: [id number] DOB:08/06/1951, 71 y.o., male Today's Date: 03/09/2022   PT End of Session - 03/09/22 0804     Visit Number 10    Number of Visits 12    Date for PT Re-Evaluation 03/15/22    Authorization Type [employer]    Progress Note Due on Visit 10    PT Start Time 0801    PT Stop Time 0841    PT Time Calculation (min) 40 min    Activity Tolerance Patient tolerated treatment well    Behavior During Therapy [facility] for tasks assessed/performed                Past Medical History:  Diagnosis Date   Arthritis    RA and OA   Carpal tunnel syndrome on right 07/08/2017   Cervical radiculopathy at C6 35/59/7416   Right   Complication of anesthesia    Coronary artery disease    Enlarged prostate    Fibromyalgia    H/O: knee surgery    15    History of blood clots    History of open heart surgery    ASCENDING AORTIC ANEURYSM   Ischemic stroke of frontal lobe ([place]) 03/14/2017   Bilateral; post-redo CT surgery   Pneumonia    PONV (postoperative nausea and vomiting)    only after CABG surgeries   Seizure disorder ([place]) 03/14/2017   Seizures ([place])    Status post knee surgery    DVT POST KNEE SURGERY   Stroke [facility])    Past Surgical History:  Procedure Laterality Date   ANTERIOR CERVICAL DECOMP/DISCECTOMY FUSION N/A 05/04/2020   Procedure: Anterior Cervical Decompression/Discectomy Fuion Cervical three-four, Cervical four-five, Cervical five-six;  Surgeon: Kary Kos, MD;  Location: [place];  Service: Neurosurgery;  Laterality: N/A;   BALLOON DILATION N/A 06/23/2019   Procedure: BALLOON DILATION;  Surgeon: Otis Brace, MD;  Location: [facility];  Service: Gastroenterology;  Laterality: N/A;   BENTALL PROCEDURE  01/04/2016   Bentall with 23 mm pericardial AVR;  SVG-LAD, SVG-CX ([place])   BICEPS TENDON REPAIR Right    BIOPSY  06/23/2019   Procedure: BIOPSY;  Surgeon: Otis Brace, MD;  Location: [facility];  Service: Gastroenterology;;   CARDIAC SURGERY     ANUERSYM MAY 2017   Bentall procedure. Bioprosthetic aortic valve #23 mm bovine model #2700 TF ask, and 28 mm Gelweave woven vascular sinus of Valsalva graft   CARPAL TUNNEL RELEASE  08/2018   right hand    CORONARY ARTERY BYPASS GRAFT  01/04/2016   VG to LAD & VG to LCX   CORONARY ARTERY BYPASS GRAFT  03/13/2017   LIMA to LAD with steril abcess removal from dacron graft   ESOPHAGOGASTRODUODENOSCOPY     Had done dilatation done about 2 or 3 times before in [place]   ESOPHAGOGASTRODUODENOSCOPY (EGD) WITH PROPOFOL N/A 06/23/2019   Procedure: ESOPHAGOGASTRODUODENOSCOPY (EGD) WITH PROPOFOL;  Surgeon: Otis Brace, MD;  [facility];  Service: Gastroenterology;  Laterality: N/A;   FALSE ANEURYSM REPAIR  03/13/2017   redo sternotomy, sterile abscess removal from Dacron graft, omental flap around aorta, CABG: LIMA-LAD ([name], Dr. Mart Piggs)   [place]  2018   Of pericardium 2018   INSERTION OF MESH  10/14/2020   Procedure: INSERTION OF MESH;  Surgeon: Michael Boston, MD;  Location: [facility];  Service: General;;   knee surgeries     13 surgeries on knee done before knee replacement    [place] N/A 05/19/2021   Procedure: LYSIS OF ADHESIONS;  Surgeon: Michael Boston, MD;  Location: [place];  Service: General;  Laterality: N/A;  GEN & LOCAL   LYSIS OF ADHESION N/A 10/14/2020   Procedure: LYSIS OF ADHESION;  Surgeon: Michael Boston, MD;  Location: [place];  Service: General;  Laterality: N/A;   open heart surgery     03-13-2017   REPLACEMENT TOTAL KNEE Left 2015   ROTATOR CUFF REPAIR Right    done with biceps tendon repair   TONSILLECTOMY     removed as a child.   TOTAL KNEE ARTHROPLASTY Right 01/18/2022    Procedure: RIGHT TOTAL KNEE ARTHROPLASTY;  Surgeon: Leandrew Koyanagi, MD;  Location: [place];  Service: Orthopedics;  Laterality: Right;   VENTRAL HERNIA REPAIR N/A 10/14/2020   Procedure: LAPAROSCOPIC VENTRAL HERNIA REPAIR WITH TAP BLOCK BILATERAL;  Surgeon: Michael Boston, MD;  Location: [place];  Service: General;  Laterality: N/A;   VENTRAL HERNIA REPAIR N/A 05/19/2021   Procedure: LAPAROSCOPIC VENTRAL WALL HERNIA REPAIR;  Surgeon: Michael Boston, MD;  Location: [place];  Service: General;  Laterality: N/A;   Patient Active Problem List   Diagnosis Date Noted   Gait abnormality 01/29/2022   Status post total right knee replacement 01/18/2022   Coronary artery disease involving native coronary artery of native heart without angina pectoris 01/04/2022   Preop cardiovascular exam 01/04/2022   Primary osteoarthritis of right knee 01/04/2022   Acute right-sided low back pain without sciatica 12/25/2021   Aortic atherosclerosis ([place]) 09/04/2021   Spondylolisthesis at L4-L5 level 06/28/2021   S/P repair of ventral hernia 05/19/2021   S/P repair of recurrent ventral hernia 05/19/2021   Benign prostatic hyperplasia without lower urinary tract symptoms 10/14/2020   Chronic pain 10/14/2020   ED (erectile dysfunction) of organic origin 10/14/2020   Esophageal dysphagia 10/14/2020   Gastroesophageal reflux disease 10/14/2020   History of aortic valve replacement with bioprosthetic valve 10/14/2020   Hx of aortic aneurysm repair 10/14/2020   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   Thoracic aortic aneurysm without rupture ([place]) 10/14/2020   Recurrent incisional hernia 10/14/2020   Spinal stenosis in cervical region 05/04/2020   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   Cervical radiculopathy at C6 11/14/2017   Carpal tunnel syndrome on right 07/08/2017   Tremor, essential 07/08/2017   Ischemic stroke of frontal lobe ([place]) 04/05/2017   Pain in right hand 04/05/2017   History of omental flap graft to  mediastinum 03/27/2017   Seizure ([place]) 03/14/2017   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/13/2017   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[place]) 03/12/2017   Essential hypertension 02/26/2017   Mixed hyperlipidemia 02/26/2017    PCP: Marrian Salvage, FNP  REFERRING PROVIDER: Leandrew Koyanagi, MD  REFERRING DIAG: s/p R TKA 01/18/2022  THERAPY DIAG:  Acute pain of right knee  Stiffness of right knee, not elsewhere classified  Difficulty in walking, not elsewhere classified  Localized edema  Muscle weakness (generalized)  Cramp and spasm  Chronic left-sided low back pain with left-sided sciatica  Rationale for Evaluation and Treatment Rehabilitation  ONSET DATE: 01/18/2022  SUBJECTIVE:   SUBJECTIVE STATEMENT: Pt reports knee just feels tight.  Saw Dr Erlinda Hong on Tuesday, said the knee looks good, maybe he tore a little scar tissue.  He called Dr.  Saintclair Halsted and has visit on 03/20/22, can't do anything before that.  Back is bad, having trouble sleeping.   PERTINENT HISTORY: History chronic back pain, multiple back surgeries, L  knee replacement, DVT, CVA frontal lobe with memory deficits, seizures, open heart surgery to repair aneurism, CABG x 2 , ACDF, hernia repair,omental flap graft to mediastinum.  PAIN:  Are you having pain? Yes: NPRS scale: 9/10 Pain location: back Pain description: constant ache  PRECAUTIONS: None  WEIGHT BEARING RESTRICTIONS No  FALLS:  Has patient fallen in last 6 months? No  LIVING ENVIRONMENT: Lives with: lives with their spouse Lives in: House/apartment Stairs: No Has following equipment at home: Single point cane, Environmental consultant - 2 wheeled, and bed side commode  OCCUPATION: retired, exercises frequently  PLOF: [place] get back to normal, do 5k every morning on treadmill   OBJECTIVE:   DIAGNOSTIC FINDINGS: DG R knee 01/18/2022 FINDINGS: Right femoral and tibial components are well situated. Expected postoperative changes are seen in the  soft tissues anteriorly.  PATIENT SURVEYS:  LEFS 40/80= 50% disability   COGNITION:  Overall cognitive status: Within functional limits for tasks assessed     SENSATION: WFL  EDEMA:  Circumferential: midcalf R 39.0 cm, L 36.5 cm   POSTURE: decreased lumbar lordosis  PALPATION: Noted increased edema R calf, but no tenderness with palpation/squeeze test, no s/s of DVT.  Incision covered with bandage still.  No s/s of infection.  Bruising from nerve block R thigh.    LOWER EXTREMITY ROM:  Active ROM Right eval Left eval Right  02/09/22 Right  02/13/22 Right 02/27/22 Right 03/06/22  Knee flexion 100 sitting 110 supine (passive) 122 sitting 130 supine 120 supine 125 supine 125   Knee extension 10 0 8 7 4 4     (Blank rows = not tested)  LOWER EXTREMITY MMT:  MMT Right eval Left eval  Hip flexion 3+ 5  Hip extension 4+ 4  Hip abduction 5 5  Hip adduction 5 5  Knee flexion 4+ 5  Knee extension 2+ quad lag 12 deg 5  Ankle dorsiflexion 5 5   (Blank rows = not tested)  HAMSTRING: measured through popliteal angle lacking 50 deg R, 20 deg L.   FUNCTIONAL TESTS:  5 times sit to stand: 13.5 sec with UE assist 03/09/2022 - 5x STS 11 seconds, no UE assist.  FGA 27/30  GAIT: Distance walked: 65' Assistive device utilized: Single point cane Level of assistance: Modified independence Comments: antalgic, decreased stance time, heel strike on R, decreased gait speed 0.65 m/s, wide BOS   03/09/22- gait speed  1.2 m/s, no AD, good heel strike bil. Stairs - ascending reciprocal step no HR.  Descending reciprocal step, HR.   TODAY'S TREATMENT: 03/09/2022 Therapeutic Exercise: to improve strength and mobility.   Bike L3 x 7 min Stairs x 2 flights reciprocal step ascending/descending Gait x 1000' Physical Performance: Goals, DGI/FGA Manual Therapy: to decrease muscle spasm and pain and improve mobility STM/TPR  to bil QL and glut med, skilled palpation and monitoring during dry  needling. Trigger Point Dry-Needling  Treatment instructions: Expect mild to moderate muscle soreness. Patient verbalized understanding of these instructions and education.  Patient Consent Given: Yes Education handout provided: Previously provided Muscles treated: bil QL and glut medius Treatment response/outcome: Twitch Response Elicited and Palpable Increase in Muscle Length  03/06/22 Therapeutic Exercise: Rec Bike L1x39mn Heel slides with foot on peanut ball 10x5" and strap SLR R 2x10 with QS Knee extension  20# 2x15 BLE, RLE 5# x 10 Knee flexion 10# RLE x 10, 25# 2x15 BLE  Manual Therapy: Passive hamstring stretch x 30 sec PROM into knee extension  03/02/2022 Therapeutic Exercise: to improve strength and mobility.  MHP applied to LB during bike and supine exercises.  Bike L3 x 6 min  Cybex leg extension 25# 2 x 15 BLE Cybex leg flexion 25# 2 x 15 BLE Bridges x 10 Single leg bridges x 20 bil S/l hip abduction x 20 bil - cues to keep leg back S/l clams RTB x 20 bil  ITBand stretch in supine with strap R 3 x 30 sec  Review of HEP and update.  Manual Therapy: to decrease muscle spasm and pain and improve mobility STM/TPR to bil QL  02/27/22 Therapeutic Exercise: to improve strength and mobility.  verbal and tactile cues throughout for technique.  MHP applied to LBP during bike and prone exercises for comfort.  Recumbent Bike L4x15mn Runner stretch 2x30" 5 way R SLS L LE reach 10x Staggered R SLS 3x10 sec hold Staggered R SLS with ant/post WS 2x10 Knee extension 25# 2x10 BLE Knee flexion 35# 2x10 BLE  Manual Therapy: STM to R medial hamstrings PROM with OP into knee extension Patellar mobs grade   Modalities: Game Ready post session - low compression, 34 deg, x 10 min  PATIENT EDUCATION:  Education details: HEP Update 02/16/22 Person educated: Patient Education method: Explanation Education comprehension: verbalized understanding   HOME EXERCISE PROGRAM: Access  [facility] ASSESSMENT:  CLINICAL IMPRESSION: Cobey Passon continues to report increased swelling R knee after walking 10k steps the other day, but otherwise doing well.  Back pain continues to be him primary limiting factor and interfering with sleep.  Reviewed his goals today, he was able to ascend/descend stairs with reciprocal step with good safety, and scored 27/30 on FGA demonstrating decreased risk of falls.  Noted path deviations with turning head, but reports cervical ROM limitations due to ACDF.  He has met LTG #2, 4, 5, 6, 7 and 8.  Plan to continue for 2 more treatments then discharge to HEP for knee, as patient is planning on requesting new order to focus on therapy for back.  Manual therapy end of session to decrease LBP, reported significant improvement following.  Declined modalities for knee today.    OBJECTIVE IMPAIRMENTS Abnormal gait, decreased endurance, decreased mobility, difficulty walking, decreased ROM, decreased strength, increased edema, increased fascial restrictions, increased muscle spasms, impaired flexibility, and pain.   ACTIVITY LIMITATIONS carrying, lifting, bending, sitting, standing, sleeping, stairs, transfers, and locomotion level  PARTICIPATION LIMITATIONS: driving, community activity, and yard work  [place]/current experiences, Transportation, and 3+ comorbidities: History chronic back pain, multiple back surgeries, L  knee replacement, DVT, CVA frontal lobe with memory deficits, seizures, open heart surgery to repair aneurism, CABG x 2 , ACDF, hernia repair,omental flap graft to mediastinum.  are also affecting patient's functional outcome.   REHAB POTENTIAL: Good  CLINICAL DECISION MAKING: Evolving/moderate complexity  EVALUATION COMPLEXITY: Moderate   GOALS: Goals reviewed with patient? Yes   SHORT TERM GOALS: Target date: 02/15/2022    Independent with initial HEP. Baseline: compliant with HEP from HHPT, able to verbalize  and demonstrate exercises correctly, performs 3x/day.  Goal status: MET  2.  Pt. Will report 75% improvement in R knee pain Baseline: constant 5-7/10 Goal status: MET - 80% improvement per pt (03/06/22)   LONG TERM GOALS: Target date: 03/15/2022   Independent with advanced/ongoing HEP  to improve outcomes and carryover.  Baseline: needs advancement Goal status: IN PROGRESS  2.  Stratton Fantroy will demonstrate R knee flexion to 120 deg to ascend/descend stairs. Baseline: R knee flexion 110 passive, 100 active Goal status: MET  125  3.  Kaylum Virani will demonstrate full R knee extension for safety with gait. Baseline: lacking 10 deg  Goal status: IN PROGRESS - 03/06/22- check under objective lacking 4  4.  Latavius Pryde will be able to ambulate 600' safely with LRAD and normal gait pattern to access community.  Baseline: using SPC, decreased gait speed.  Goal status: MET  5.  Elijahjames Zurn will be able to ascend/descend stairs with 1 HR and reciprocal step pattern safely to access home and community.  Baseline: unable Goal status: MET  6.  Yohann Defina will demonstrate > 19/24 on DGI to demonstrate decreased risk of falls.   Baseline: NT today Goal status: MET  03/09/22- 27/30 on FGA  7.  Taris Fike will demonstrate improved functional LE strength by completing 5x STS in <13 seconds without UE assist Baseline: 13.5 sec with UE assist.  Cannot stand without UE assist.  Goal status: MET 11 seconds without UE assist.   8.  Satchel Freda will demonstrate <40% disability on LEFS to demonstrate improved mobility.  Baseline: 40/80 = 50% disability Goal status: MET  03/09/22- 73/80 = 9% disability  9.  Tayvian Anzaldo will be able to return to walking 5K daily without limitation from R knee pain or LBP.  Baseline: unable Goal status: IN PROGRESS walking 3-4k steps each morning, but still has back and knee pain.     PLAN: PT FREQUENCY: 2x/week  PT DURATION: 6 weeks  PLANNED  INTERVENTIONS: Therapeutic exercises, Therapeutic activity, Neuromuscular re-education, Balance training, Gait training, Patient/Family education, Joint mobilization, Stair training, Dry Needling, Cryotherapy, Moist heat, scar mobilization, Vasopneumatic device, Ultrasound, and Manual therapy  PLAN FOR NEXT SESSION: progress LE strengthening and ROM as tolerated, focus on knee extension, modalities PRN.  Also progress core strengthening as tolerated - prefers standing exercises.   DN to QL.    Rennie Natter, PT, DPT 03/09/2022, 11:39 AM

## 2022-03-12 ENCOUNTER — Telehealth: Payer: Self-pay | Admitting: Cardiology

## 2022-03-12 ENCOUNTER — Other Ambulatory Visit: Payer: Self-pay | Admitting: Pharmacist

## 2022-03-12 ENCOUNTER — Other Ambulatory Visit (HOSPITAL_BASED_OUTPATIENT_CLINIC_OR_DEPARTMENT_OTHER): Payer: Self-pay

## 2022-03-12 ENCOUNTER — Ambulatory Visit (HOSPITAL_COMMUNITY)
Admission: RE | Admit: 2022-03-12 | Discharge: 2022-03-12 | Disposition: A | Discharge status: Home / Self Care | Payer: Medicare HMO | Source: Ambulatory Visit | Attending: Cardiology | Admitting: Cardiology

## 2022-03-12 ENCOUNTER — Other Ambulatory Visit: Payer: Self-pay | Admitting: Physician Assistant

## 2022-03-12 DIAGNOSIS — E782 Mixed hyperlipidemia: Secondary | ICD-10-CM | POA: Insufficient documentation

## 2022-03-12 DIAGNOSIS — I639 Cerebral infarction, unspecified: Secondary | ICD-10-CM | POA: Diagnosis present

## 2022-03-12 MED ORDER — INCLISIRAN SODIUM 284 MG/1.5ML ~~LOC~~ SOSY
PREFILLED_SYRINGE | SUBCUTANEOUS | Status: AC
  Administered 2022-03-12: 284 mg via SUBCUTANEOUS
  Filled 2022-03-12: qty 1.5

## 2022-03-12 MED ORDER — INCLISIRAN SODIUM 284 MG/1.5ML ~~LOC~~ SOSY: 284.0000 mg | PREFILLED_SYRINGE | Freq: Once | SUBCUTANEOUS | Status: AC

## 2022-03-12 NOTE — Telephone Encounter (Signed)
Patient advised that he should continue pravastatin along with leqvio. Orders for his next Leqvio injection scheduled.

## 2022-03-12 NOTE — Telephone Encounter (Signed)
Pt c/o medication issue:  1. Name of Medication: pravastatin (PRAVACHOL) 40 MG tablet  2. How are you currently taking this medication (dosage and times per day)? As written   3. Are you having a reaction (difficulty breathing--STAT)? no  4. What is your medication issue? Pt states that he just had his injection at the hospital this morning and he wants to know if he should still take this medication tonight. Please advise.

## 2022-03-13 ENCOUNTER — Encounter: Payer: Self-pay | Admitting: Physical Therapy

## 2022-03-13 ENCOUNTER — Ambulatory Visit: Payer: Medicare HMO | Admitting: Physical Therapy

## 2022-03-13 DIAGNOSIS — R600 Localized edema: Secondary | ICD-10-CM

## 2022-03-13 DIAGNOSIS — R262 Difficulty in walking, not elsewhere classified: Secondary | ICD-10-CM

## 2022-03-13 DIAGNOSIS — M25561 Pain in right knee: Secondary | ICD-10-CM | POA: Diagnosis not present

## 2022-03-13 DIAGNOSIS — G8929 Other chronic pain: Secondary | ICD-10-CM

## 2022-03-13 DIAGNOSIS — M25661 Stiffness of right knee, not elsewhere classified: Secondary | ICD-10-CM

## 2022-03-13 DIAGNOSIS — R252 Cramp and spasm: Secondary | ICD-10-CM

## 2022-03-13 DIAGNOSIS — M6281 Muscle weakness (generalized): Secondary | ICD-10-CM

## 2022-03-13 NOTE — Therapy (Signed)
OUTPATIENT PHYSICAL THERAPY TREATMENT  Patient Name: Erik Salazar MRN: [id number] DOB:Mar 24, 1951, 71 y.o., male Today's Date: 03/13/2022   PT End of Session - 03/13/22 0800     Visit Number 11    Number of Visits 12    Date for PT Re-Evaluation 03/15/22    Authorization Type [employer]    Progress Note Due on Visit 10    PT Start Time 0800    PT Stop Time 0855    PT Time Calculation (min) 55 min    Activity Tolerance Patient tolerated treatment well    Behavior During Therapy [facility] for tasks assessed/performed                Past Medical History:  Diagnosis Date   Arthritis    RA and OA   Carpal tunnel syndrome on right 07/08/2017   Cervical radiculopathy at C6 79/48/0165   Right   Complication of anesthesia    Coronary artery disease    Enlarged prostate    Fibromyalgia    H/O: knee surgery    15    History of blood clots    History of open heart surgery    ASCENDING AORTIC ANEURYSM   Ischemic stroke of frontal lobe ([name]) 03/14/2017   Bilateral; post-redo CT surgery   Pneumonia    PONV (postoperative nausea and vomiting)    only after CABG surgeries   Seizure disorder ([name]) 03/14/2017   Seizures ([name])    Status post knee surgery    DVT POST KNEE SURGERY   Stroke [facility])    Past Surgical History:  Procedure Laterality Date   ANTERIOR CERVICAL DECOMP/DISCECTOMY FUSION N/A 05/04/2020   Procedure: Anterior Cervical Decompression/Discectomy Fuion Cervical three-four, Cervical four-five, Cervical five-six;  Surgeon: Kary Kos, MD;  Location: [place];  Service: Neurosurgery;  Laterality: N/A;   BALLOON DILATION N/A 06/23/2019   Procedure: BALLOON DILATION;  Surgeon: Otis Brace, MD;  Location: [facility];  Service: Gastroenterology;  Laterality: N/A;   BENTALL PROCEDURE  01/04/2016   Bentall with 23 mm pericardial AVR; SVG-LAD, SVG-CX ([place])   BICEPS TENDON REPAIR Right    BIOPSY  06/23/2019   Procedure: BIOPSY;  Surgeon:  Otis Brace, MD;  Location: [facility];  Service: Gastroenterology;;   CARDIAC SURGERY     ANUERSYM MAY 2017   Bentall procedure. Bioprosthetic aortic valve #23 mm bovine model #2700 TF ask, and 28 mm Gelweave woven vascular sinus of Valsalva graft   CARPAL TUNNEL RELEASE  08/2018   right hand    CORONARY ARTERY BYPASS GRAFT  01/04/2016   VG to LAD & VG to LCX   CORONARY ARTERY BYPASS GRAFT  03/13/2017   LIMA to LAD with steril abcess removal from dacron graft   ESOPHAGOGASTRODUODENOSCOPY     Had done dilatation done about 2 or 3 times before in [place]   ESOPHAGOGASTRODUODENOSCOPY (EGD) WITH PROPOFOL N/A 06/23/2019   Procedure: ESOPHAGOGASTRODUODENOSCOPY (EGD) WITH PROPOFOL;  Surgeon: Otis Brace, MD;  [facility];  Service: Gastroenterology;  Laterality: N/A;   FALSE ANEURYSM REPAIR  03/13/2017   redo sternotomy, sterile abscess removal from Dacron graft, omental flap around aorta, CABG: LIMA-LAD ([name], Dr. Mart Piggs)   [name]  2018   Of pericardium 2018   INSERTION OF MESH  10/14/2020   Procedure: INSERTION OF MESH;  Surgeon: Michael Boston, MD;  Location: [place];  Service: General;;   knee surgeries     13 surgeries on knee done before  knee replacement    KNEE SURGERY  1983   LAPAROSCOPIC LYSIS OF ADHESIONS N/A 05/19/2021   Procedure: LYSIS OF ADHESIONS;  Surgeon: Michael Boston, MD;  Location: [place];  Service: General;  Laterality: N/A;  GEN & LOCAL   LYSIS OF ADHESION N/A 10/14/2020   Procedure: LYSIS OF ADHESION;  Surgeon: Michael Boston, MD;  Location: [place];  Service: General;  Laterality: N/A;   open heart surgery     03-13-2017   REPLACEMENT TOTAL KNEE Left 2015   ROTATOR CUFF REPAIR Right    done with biceps tendon repair   TONSILLECTOMY     removed as a child.   TOTAL KNEE ARTHROPLASTY Right 01/18/2022   Procedure: RIGHT TOTAL KNEE ARTHROPLASTY;  Surgeon: Leandrew Koyanagi, MD;  Location: [place];  Service: Orthopedics;  Laterality:  Right;   VENTRAL HERNIA REPAIR N/A 10/14/2020   Procedure: LAPAROSCOPIC VENTRAL HERNIA REPAIR WITH TAP BLOCK BILATERAL;  Surgeon: Michael Boston, MD;  Location: [place];  Service: General;  Laterality: N/A;   VENTRAL HERNIA REPAIR N/A 05/19/2021   Procedure: LAPAROSCOPIC VENTRAL WALL HERNIA REPAIR;  Surgeon: Michael Boston, MD;  Location: [place];  Service: General;  Laterality: N/A;   Patient Active Problem List   Diagnosis Date Noted   Gait abnormality 01/29/2022   Status post total right knee replacement 01/18/2022   Coronary artery disease involving native coronary artery of native heart without angina pectoris 01/04/2022   Preop cardiovascular exam 01/04/2022   Primary osteoarthritis of right knee 01/04/2022   Acute right-sided low back pain without sciatica 12/25/2021   Aortic atherosclerosis ([name]) 09/04/2021   Spondylolisthesis at L4-L5 level 06/28/2021   S/P repair of ventral hernia 05/19/2021   S/P repair of recurrent ventral hernia 05/19/2021   Benign prostatic hyperplasia without lower urinary tract symptoms 10/14/2020   Chronic pain 10/14/2020   ED (erectile dysfunction) of organic origin 10/14/2020   Esophageal dysphagia 10/14/2020   Gastroesophageal reflux disease 10/14/2020   History of aortic valve replacement with bioprosthetic valve 10/14/2020   Hx of aortic aneurysm repair 10/14/2020   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   Thoracic aortic aneurysm without rupture ([place]) 10/14/2020   Recurrent incisional hernia 10/14/2020   Spinal stenosis in cervical region 05/04/2020   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   Cervical radiculopathy at C6 11/14/2017   Carpal tunnel syndrome on right 07/08/2017   Tremor, essential 07/08/2017   Ischemic stroke of frontal lobe ([name]) 04/05/2017   Pain in right hand 04/05/2017   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 03/27/2017   Seizure ([name]) 03/14/2017   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/13/2017   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[name])  03/12/2017   Essential hypertension 02/26/2017   Mixed hyperlipidemia 02/26/2017    PCP: Marrian Salvage, FNP  REFERRING PROVIDER: Leandrew Koyanagi, MD  REFERRING DIAG: s/p R TKA 01/18/2022  THERAPY DIAG:  Acute pain of right knee  Stiffness of right knee, not elsewhere classified  Difficulty in walking, not elsewhere classified  Localized edema  Muscle weakness (generalized)  Cramp and spasm  Chronic left-sided low back pain with left-sided sciatica  Rationale for Evaluation and Treatment Rehabilitation  ONSET DATE: 01/18/2022  SUBJECTIVE:   SUBJECTIVE STATEMENT: "I'm here, the back stinks."  Walked 3600 steps this morning.  Dry needling helped his back only for about a day.   PERTINENT HISTORY: History chronic back pain, multiple back surgeries, L  knee replacement, DVT, CVA frontal lobe with memory deficits, seizures, open heart surgery  to repair aneurism, CABG x 2 , ACDF, hernia repair,omental flap graft to mediastinum.  PAIN:  Are you having pain? Yes: NPRS scale: 10/10 Pain location: back, 3/10 R knee Pain description: constant ache  PRECAUTIONS: None  WEIGHT BEARING RESTRICTIONS No  FALLS:  Has patient fallen in last 6 months? No  LIVING ENVIRONMENT: Lives with: lives with their spouse Lives in: House/apartment Stairs: No Has following equipment at home: Single point cane, Environmental consultant - 2 wheeled, and bed side commode  OCCUPATION: retired, exercises frequently  PLOF: [place] get back to normal, do 5k every morning on treadmill   OBJECTIVE:   DIAGNOSTIC FINDINGS: DG R knee 01/18/2022 FINDINGS: Right femoral and tibial components are well situated. Expected postoperative changes are seen in the soft tissues anteriorly.  PATIENT SURVEYS:  LEFS 40/80= 50% disability   COGNITION:  Overall cognitive status: Within functional limits for tasks assessed     SENSATION: WFL  EDEMA:  Circumferential: midcalf R 39.0 cm, L 36.5  cm   POSTURE: decreased lumbar lordosis  PALPATION: Noted increased edema R calf, but no tenderness with palpation/squeeze test, no s/s of DVT.  Incision covered with bandage still.  No s/s of infection.  Bruising from nerve block R thigh.    LOWER EXTREMITY ROM:  Active ROM Right eval Left eval Right  02/09/22 Right  02/13/22 Right 02/27/22 Right 03/13/22  Knee flexion 100 sitting 110 supine (passive) 122 sitting 130 supine 120 supine 125 supine 125   Knee extension 10 0 8 7 4 5    (Blank rows = not tested)  LOWER EXTREMITY MMT:  MMT Right eval Left eval  Hip flexion 3+ 5  Hip extension 4+ 4  Hip abduction 5 5  Hip adduction 5 5  Knee flexion 4+ 5  Knee extension 2+ quad lag 12 deg 5  Ankle dorsiflexion 5 5   (Blank rows = not tested)  HAMSTRING: measured through popliteal angle lacking 50 deg R, 20 deg L.   FUNCTIONAL TESTS:  5 times sit to stand: 13.5 sec with UE assist 03/09/2022 - 5x STS 11 seconds, no UE assist.  FGA 27/30  GAIT: Distance walked: 35' Assistive device utilized: Single point cane Level of assistance: Modified independence Comments: antalgic, decreased stance time, heel strike on R, decreased gait speed 0.65 m/s, wide BOS   03/09/22- gait speed  1.2 m/s, no AD, good heel strike bil. Stairs - ascending reciprocal step no HR.  Descending reciprocal step, HR.   TODAY'S TREATMENT: 03/13/2022 Therapeutic Exercise: to improve strength and mobility.  Bike L3 x 8.5 min  (2 miles) Cybex knee extension 25# 1 x 15, 30# 1 x 15 BLE, 10# 1 x 15 RLE Cybex knee flexion  30# 3 x 15 BLE, 10# 1 x 15 RLE Cybex leg press 25# 1 x 15 BLE, 1 x 15 RLE, 1 x 15 toe press Manual Therapy: to decrease muscle spasm and pain and improve mobility STM/TPR to hamstrings, skilled palpation and monitoring during dry needling.  PA mobs tibia on femur, patellar mobs, scar tissue mobilization, extension overpressure with IR femur and ER of tibia, to improve knee extension.  Trigger  Point Dry-Needling  Treatment instructions: Expect mild to moderate muscle soreness.  Patient verbalized understanding of these instructions and education.  Patient Consent Given: Yes Education handout provided: Previously provided Muscles treated: R hamstrings med and lateral Treatment response/outcome: Twitch Response Elicited and Palpable Increase in Muscle Length Vasopneumatic: Gameready at end of session for post session  soreness/edema.  10 min, low compression, 34 deg (coldest), seated position with RLE extended, added 6lb weight to thigh for further extension stretch.    03/09/2022 Therapeutic Exercise: to improve strength and mobility.   Bike L3 x 7 min Stairs x 2 flights reciprocal step ascending/descending Gait x 1000' Physical Performance: Goals, DGI/FGA Manual Therapy: to decrease muscle spasm and pain and improve mobility STM/TPR  to bil QL and glut med, skilled palpation and monitoring during dry needling. Trigger Point Dry-Needling  Treatment instructions: Expect mild to moderate muscle soreness. Patient verbalized understanding of these instructions and education.  Patient Consent Given: Yes Education handout provided: Previously provided Muscles treated: bil QL and glut medius Treatment response/outcome: Twitch Response Elicited and Palpable Increase in Muscle Length  03/06/22 Therapeutic Exercise: Rec Bike L1x80mn Heel slides with foot on peanut ball 10x5" and strap SLR R 2x10 with QS Knee extension 20# 2x15 BLE, RLE 5# x 10 Knee flexion 10# RLE x 10, 25# 2x15 BLE  Manual Therapy: Passive hamstring stretch x 30 sec PROM into knee extension  03/02/2022 Therapeutic Exercise: to improve strength and mobility.  MHP applied to LB during bike and supine exercises.  Bike L3 x 6 min  Cybex leg extension 25# 2 x 15 BLE Cybex leg flexion 25# 2 x 15 BLE Bridges x 10 Single leg bridges x 20 bil S/l hip abduction x 20 bil - cues to keep leg back S/l clams RTB x 20 bil   ITBand stretch in supine with strap R 3 x 30 sec  Review of HEP and update.  Manual Therapy: to decrease muscle spasm and pain and improve mobility STM/TPR to bil QL   PATIENT EDUCATION:  Education details: HEP Update 02/16/22 Person educated: Patient Education method: Explanation Education comprehension: verbalized understanding   [facility] EXERCISE PROGRAM: [facility]:  CLINICAL IMPRESSION: Kort Griner continues to report severe LBP and only 1 day relief following DN last session.  Today focused interventions on continuing to strengthen RLE, progressed to leg press, but reporting increased pressure on abdomen.  Noted still unable to fully extend RLE,  so manual therapy focusing on knee extension.   Noted continues to have significant swelling around R knee, still lacking 5 deg today.  Game ready end of session with leg extended and weight on thigh for extra stretch.  Planning to extend POC to continue working on knee extension.   Mohsen Alejo continues to demonstrate potential for improvement and would benefit from continued skilled therapy to address impairments.      OBJECTIVE IMPAIRMENTS Abnormal gait, decreased endurance, decreased mobility, difficulty walking, decreased ROM, decreased strength, increased edema, increased fascial restrictions, increased muscle spasms, impaired flexibility, and pain.   ACTIVITY LIMITATIONS carrying, lifting, bending, sitting, standing, sleeping, stairs, transfers, and locomotion level  PARTICIPATION LIMITATIONS: driving, community activity, and yard work  [place]Past/current experiences, Transportation, and 3+ comorbidities: History chronic back pain, multiple back surgeries, L  knee replacement, DVT, CVA frontal lobe with memory deficits, seizures, open heart surgery to repair aneurism, CABG x 2 , ACDF, hernia repair,omental flap graft to mediastinum.  are also affecting patient's functional outcome.   REHAB  POTENTIAL: Good  CLINICAL DECISION MAKING: Evolving/moderate complexity  EVALUATION COMPLEXITY: Moderate   GOALS: Goals reviewed with patient? Yes   SHORT TERM GOALS: Target date: 02/15/2022    Independent with initial HEP. Baseline: compliant with HEP from HHPT, able to verbalize and demonstrate exercises correctly, performs 3x/day.  Goal status: MET  2.  Pt. Will report 75% improvement in R knee pain Baseline: constant 5-7/10 Goal status: MET - 80% improvement per pt (03/06/22)   LONG TERM GOALS: Target date: 03/15/2022   Independent with advanced/ongoing HEP to improve outcomes and carryover.  Baseline: needs advancement Goal status: IN PROGRESS  2.  Briana Kocher will demonstrate R knee flexion to 120 deg to ascend/descend stairs. Baseline: R knee flexion 110 passive, 100 active Goal status: MET  125  3.  Tacoma Voshell will demonstrate full R knee extension for safety with gait. Baseline: lacking 10 deg  Goal status: IN PROGRESS - 03/13/22- lacking 5 deg.   4.  Sayge Barcia will be able to ambulate 600' safely with LRAD and normal gait pattern to access community.  Baseline: using SPC, decreased gait speed.  Goal status: MET  5.  Jrake Kazmierczak will be able to ascend/descend stairs with 1 HR and reciprocal step pattern safely to access home and community.  Baseline: unable Goal status: MET  6.  Tracen Backstrom will demonstrate > 19/24 on DGI to demonstrate decreased risk of falls.   Baseline: NT today Goal status: MET  03/09/22- 27/30 on FGA  7.  Reeder Narducci will demonstrate improved functional LE strength by completing 5x STS in <13 seconds without UE assist Baseline: 13.5 sec with UE assist.  Cannot stand without UE assist.  Goal status: MET 11 seconds without UE assist.   8.  Livan Badertscher will demonstrate <40% disability on LEFS to demonstrate improved mobility.  Baseline: 40/80 = 50% disability Goal status: MET  03/09/22- 73/80 = 9% disability  9.  Trei  Kings will be able to return to walking 5K daily without limitation from R knee pain or LBP.  Baseline: unable Goal status: IN PROGRESS walking 3-4k steps each morning, but still has back and knee pain.     PLAN: PT FREQUENCY: 2x/week  PT DURATION: 6 weeks  PLANNED INTERVENTIONS: Therapeutic exercises, Therapeutic activity, Neuromuscular re-education, Balance training, Gait training, Patient/Family education, Joint mobilization, Stair training, Dry Needling, Cryotherapy, Moist heat, scar mobilization, Vasopneumatic device, Ultrasound, and Manual therapy  PLAN FOR NEXT SESSION: progress LE strengthening and ROM as tolerated, focus on knee extension, modalities PRN.  Also progress core strengthening as tolerated - prefers standing exercises.      Rennie Natter, PT, DPT 03/13/2022, 9:01 AM

## 2022-03-16 ENCOUNTER — Encounter: Payer: Self-pay | Admitting: Physical Therapy

## 2022-03-16 ENCOUNTER — Ambulatory Visit: Payer: Medicare HMO | Admitting: Physical Therapy

## 2022-03-16 DIAGNOSIS — G8929 Other chronic pain: Secondary | ICD-10-CM

## 2022-03-16 DIAGNOSIS — M25561 Pain in right knee: Secondary | ICD-10-CM

## 2022-03-16 DIAGNOSIS — M6281 Muscle weakness (generalized): Secondary | ICD-10-CM

## 2022-03-16 DIAGNOSIS — M25661 Stiffness of right knee, not elsewhere classified: Secondary | ICD-10-CM

## 2022-03-16 DIAGNOSIS — R600 Localized edema: Secondary | ICD-10-CM

## 2022-03-16 DIAGNOSIS — R252 Cramp and spasm: Secondary | ICD-10-CM

## 2022-03-16 DIAGNOSIS — R262 Difficulty in walking, not elsewhere classified: Secondary | ICD-10-CM

## 2022-03-16 NOTE — Therapy (Signed)
OUTPATIENT PHYSICAL THERAPY TREATMENT  Patient Name: Erik Salazar MRN: [id number] DOB:[birth year], 71 y.o., male Today's Date: 03/16/2022   PT End of Session - 03/16/22 0804     Visit Number 12    Number of Visits 16    Date for PT Re-Evaluation 03/30/22    Authorization Type [employer]    Progress Note Due on Visit 55    PT Start Time 0801    PT Stop Time 0856    PT Time Calculation (min) 55 min    Activity Tolerance Patient tolerated treatment well    Behavior During Therapy [facility] for tasks assessed/performed                Past Medical History:  Diagnosis Date   Arthritis    RA and OA   Carpal tunnel syndrome on right 07/08/2017   Cervical radiculopathy at C6 44/96/7591   Right   Complication of anesthesia    Coronary artery disease    Enlarged prostate    Fibromyalgia    H/O: knee surgery    15    History of blood clots    History of open heart surgery    ASCENDING AORTIC ANEURYSM   Ischemic stroke of frontal lobe ([place]) 03/14/2017   Bilateral; post-redo CT surgery   Pneumonia    PONV (postoperative nausea and vomiting)    only after CABG surgeries   Seizure disorder ([place]) 03/14/2017   Seizures ([place])    Status post knee surgery    DVT POST KNEE SURGERY   Stroke [facility])    Past Surgical History:  Procedure Laterality Date   ANTERIOR CERVICAL DECOMP/DISCECTOMY FUSION N/A 05/04/2020   Procedure: Anterior Cervical Decompression/Discectomy Fuion Cervical three-four, Cervical four-five, Cervical five-six;  Surgeon: Kary Kos, MD;  Location: [place];  Service: Neurosurgery;  Laterality: N/A;   BALLOON DILATION N/A 06/23/2019   Procedure: BALLOON DILATION;  Surgeon: Otis Brace, MD;  [facility];  Service: Gastroenterology;  Laterality: N/A;   BENTALL PROCEDURE  01/04/2016   Bentall with 23 mm pericardial AVR; SVG-LAD, SVG-CX ([place])   BICEPS TENDON REPAIR Right    BIOPSY  06/23/2019   Procedure: BIOPSY;  Surgeon:  Otis Brace, MD;  [facility]: [facility];  Service: Gastroenterology;;   CARDIAC SURGERY     ANUERSYM MAY 2017   Bentall procedure. Bioprosthetic aortic valve #23 mm bovine model #2700 TF ask, and 28 mm Gelweave woven vascular sinus of Valsalva graft   CARPAL TUNNEL RELEASE  08/2018   right hand    CORONARY ARTERY BYPASS GRAFT  01/04/2016   VG to LAD & VG to LCX   CORONARY ARTERY BYPASS GRAFT  03/13/2017   LIMA to LAD with steril abcess removal from dacron graft   ESOPHAGOGASTRODUODENOSCOPY     Had done dilatation done about 2 or 3 times before in [place]   ESOPHAGOGASTRODUODENOSCOPY (EGD) WITH PROPOFOL N/A 06/23/2019   Procedure: ESOPHAGOGASTRODUODENOSCOPY (EGD) WITH PROPOFOL;  Surgeon: Otis Brace, MD;  [facility];  Service: Gastroenterology;  Laterality: N/A;   FALSE ANEURYSM REPAIR  03/13/2017   redo sternotomy, sterile abscess removal from Dacron graft, omental flap around aorta, CABG: LIMA-LAD ([name], Dr. Mart Piggs)   [place]  2018   Of pericardium 2018   INSERTION OF MESH  10/14/2020   Procedure: INSERTION OF MESH;  Surgeon: Michael Boston, MD;  Location: [place];  Service: General;;   knee surgeries     13 surgeries on knee done before  knee replacement    KNEE SURGERY  1983   LAPAROSCOPIC LYSIS OF ADHESIONS N/A 05/19/2021   Procedure: LYSIS OF ADHESIONS;  Surgeon: Michael Boston, MD;  Location: [place];  Service: General;  Laterality: N/A;  GEN & LOCAL   LYSIS OF ADHESION N/A 10/14/2020   Procedure: LYSIS OF ADHESION;  Surgeon: Michael Boston, MD;  Location: [place];  Service: General;  Laterality: N/A;   open heart surgery     03-13-2017   REPLACEMENT TOTAL KNEE Left 2015   ROTATOR CUFF REPAIR Right    done with biceps tendon repair   TONSILLECTOMY     removed as a child.   TOTAL KNEE ARTHROPLASTY Right 01/18/2022   Procedure: RIGHT TOTAL KNEE ARTHROPLASTY;  Surgeon: Leandrew Koyanagi, MD;  Location: [place];  Service: Orthopedics;  Laterality:  Right;   VENTRAL HERNIA REPAIR N/A 10/14/2020   Procedure: LAPAROSCOPIC VENTRAL HERNIA REPAIR WITH TAP BLOCK BILATERAL;  Surgeon: Michael Boston, MD;  Location: [place];  Service: General;  Laterality: N/A;   VENTRAL HERNIA REPAIR N/A 05/19/2021   Procedure: LAPAROSCOPIC VENTRAL WALL HERNIA REPAIR;  Surgeon: Michael Boston, MD;  Location: [place];  Service: General;  Laterality: N/A;   Patient Active Problem List   Diagnosis Date Noted   Gait abnormality 01/29/2022   Status post total right knee replacement 01/18/2022   Coronary artery disease involving native coronary artery of native heart without angina pectoris 01/04/2022   Preop cardiovascular exam 01/04/2022   Primary osteoarthritis of right knee 01/04/2022   Acute right-sided low back pain without sciatica 12/25/2021   Aortic atherosclerosis ([place]) 09/04/2021   Spondylolisthesis at L4-L5 level 06/28/2021   S/P repair of ventral hernia 05/19/2021   S/P repair of recurrent ventral hernia 05/19/2021   Benign prostatic hyperplasia without lower urinary tract symptoms 10/14/2020   Chronic pain 10/14/2020   ED (erectile dysfunction) of organic origin 10/14/2020   Esophageal dysphagia 10/14/2020   Gastroesophageal reflux disease 10/14/2020   History of aortic valve replacement with bioprosthetic valve 10/14/2020   Hx of aortic aneurysm repair 10/14/2020   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   Thoracic aortic aneurysm without rupture ([place]) 10/14/2020   Recurrent incisional hernia 10/14/2020   Spinal stenosis in cervical region 05/04/2020   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   Cervical radiculopathy at C6 11/14/2017   Carpal tunnel syndrome on right 07/08/2017   Tremor, essential 07/08/2017   Ischemic stroke of frontal lobe ([place]) 04/05/2017   Pain in right hand 04/05/2017   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 03/27/2017   Seizure ([place]) 03/14/2017   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/13/2017   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[place])  03/12/2017   Essential hypertension 02/26/2017   Mixed hyperlipidemia 02/26/2017    PCP: Marrian Salvage, FNP  REFERRING PROVIDER: Leandrew Koyanagi, MD  REFERRING DIAG: s/p R TKA 01/18/2022  THERAPY DIAG:  Acute pain of right knee  Stiffness of right knee, not elsewhere classified  Difficulty in walking, not elsewhere classified  Localized edema  Muscle weakness (generalized)  Cramp and spasm  Chronic left-sided low back pain with left-sided sciatica  Rationale for Evaluation and Treatment Rehabilitation  ONSET DATE: 01/18/2022  SUBJECTIVE:   SUBJECTIVE STATEMENT: Back is a 10 today.  Sees spine doctor on Tuesday.  Knee is just stiff/tight.   PERTINENT HISTORY: History chronic back pain, multiple back surgeries, L  knee replacement, DVT, CVA frontal lobe with memory deficits, seizures, open heart surgery to repair aneurism, CABG x 2 ,  ACDF, hernia repair,omental flap graft to mediastinum.  PAIN:  Are you having pain? Yes: NPRS scale: 10/10 Pain location: back, 3/10 R knee Pain description: constant ache  PRECAUTIONS: None  WEIGHT BEARING RESTRICTIONS No  FALLS:  Has patient fallen in last 6 months? No  LIVING ENVIRONMENT: Lives with: lives with their spouse Lives in: House/apartment Stairs: No Has following equipment at home: Single point cane, Environmental consultant - 2 wheeled, and bed side commode  OCCUPATION: retired, exercises frequently  PLOF: [place] get back to normal, do 5k every morning on treadmill   OBJECTIVE:   DIAGNOSTIC FINDINGS: DG R knee 01/18/2022 FINDINGS: Right femoral and tibial components are well situated. Expected postoperative changes are seen in the soft tissues anteriorly.  PATIENT SURVEYS:  LEFS 40/80= 50% disability   COGNITION:  Overall cognitive status: Within functional limits for tasks assessed     SENSATION: WFL  EDEMA:  Circumferential: midcalf R 39.0 cm, L 36.5 cm   POSTURE: decreased lumbar  lordosis  PALPATION: Noted increased edema R calf, but no tenderness with palpation/squeeze test, no s/s of DVT.  Incision covered with bandage still.  No s/s of infection.  Bruising from nerve block R thigh.    LOWER EXTREMITY ROM:  Active ROM Right eval Left eval Right  02/09/22 Right  02/13/22 Right 02/27/22 Right 03/13/22  Knee flexion 100 sitting 110 supine (passive) 122 sitting 130 supine 120 supine 125 supine 125   Knee extension 10 0 _0 (Blank rows = not tested)  LOWER EXTREMITY MMT:  MMT Right eval Left eval Right 03/16/22  Hip flexion 3+ 5 5  Hip extension 4+ 4   Hip abduction 5 5   Hip adduction 5 5   Knee flexion 4+ 5 5  Knee extension 2+ quad lag 12 deg 5 5  Ankle dorsiflexion 5 5    (Blank rows = not tested)  HAMSTRING: measured through popliteal angle lacking 50 deg R, 20 deg L.   FUNCTIONAL TESTS:  5 times sit to stand: 13.5 sec with UE assist 03/09/2022 - 5x STS 11 seconds, no UE assist.  FGA 27/30  GAIT: Distance walked: 35' Assistive device utilized: Single point cane Level of assistance: Modified independence Comments: antalgic, decreased stance time, heel strike on R, decreased gait speed 0.65 m/s, wide BOS   03/09/22- gait speed  1.2 m/s, no AD, good heel strike bil. Stairs - ascending reciprocal step no HR.  Descending reciprocal step, HR.   TODAY'S TREATMENT: 03/16/22 Therapeutic Exercise: to improve strength and mobility.   Bike L3 x 8 min  (with MHP for back) Cybex knee extension 30# 2 x 15 BLE, 15# 2 x 15 RLE  Cybex knee flexion 30# 2 x 15 BLE, 15# 2 x 15 RLE  Cybex leg press 35# 2 x 15 BLE,  Cybex toe press 35# 2 x 15 BLE Hip extension 4# ankle weights, leaning against counter 2 x 15 bil  Runners stretch - with PT ER femur on tibia to improve extension 5 x 15 sec hol Calf stretch to improve ankle DF 3 x 30 sec hold Vasopneumatic: Gameready at end of session for post session soreness/edema.  10 min, low compression, 34 deg  (coldest).    03/13/2022 Therapeutic Exercise: to improve strength and mobility.  Bike L3 x 8.5 min  (2 miles) Cybex knee extension 25# 1 x 15, 30# 1 x 15 BLE, 10# 1 x 15 RLE Cybex knee flexion  30# 3  x 15 BLE, 10# 1 x 15 RLE Cybex leg press 25# 1 x 15 BLE, 1 x 15 RLE, 1 x 15 toe press Manual Therapy: to decrease muscle spasm and pain and improve mobility STM/TPR to hamstrings, skilled palpation and monitoring during dry needling.  PA mobs tibia on femur, patellar mobs, scar tissue mobilization, extension overpressure with IR femur and ER of tibia, to improve knee extension.  Trigger Point Dry-Needling  Treatment instructions: Expect mild to moderate muscle soreness.  Patient verbalized understanding of these instructions and education.  Patient Consent Given: Yes Education handout provided: Previously provided Muscles treated: R hamstrings med and lateral Treatment response/outcome: Twitch Response Elicited and Palpable Increase in Muscle Length Vasopneumatic: Gameready at end of session for post session soreness/edema.  10 min, low compression, 34 deg (coldest), seated position with RLE extended, added 6lb weight to thigh for further extension stretch.    03/09/2022 Therapeutic Exercise: to improve strength and mobility.   Bike L3 x 7 min Stairs x 2 flights reciprocal step ascending/descending Gait x 1000' Physical Performance: Goals, DGI/FGA Manual Therapy: to decrease muscle spasm and pain and improve mobility STM/TPR  to bil QL and glut med, skilled palpation and monitoring during dry needling. Trigger Point Dry-Needling  Treatment instructions: Expect mild to moderate muscle soreness. Patient verbalized understanding of these instructions and education.  Patient Consent Given: Yes Education handout provided: Previously provided Muscles treated: bil QL and glut medius Treatment response/outcome: Twitch Response Elicited and Palpable Increase in Muscle  Length  03/06/22 Therapeutic Exercise: Rec Bike L1x1mn Heel slides with foot on peanut ball 10x5" and strap SLR R 2x10 with QS Knee extension 20# 2x15 BLE, RLE 5# x 10 Knee flexion 10# RLE x 10, 25# 2x15 BLE  Manual Therapy: Passive hamstring stretch x 30 sec PROM into knee extension  03/02/2022 Therapeutic Exercise: to improve strength and mobility.  MHP applied to LB during bike and supine exercises.  Bike L3 x 6 min  Cybex leg extension 25# 2 x 15 BLE Cybex leg flexion 25# 2 x 15 BLE Bridges x 10 Single leg bridges x 20 bil S/l hip abduction x 20 bil - cues to keep leg back S/l clams RTB x 20 bil  ITBand stretch in supine with strap R 3 x 30 sec  Review of HEP and update.  Manual Therapy: to decrease muscle spasm and pain and improve mobility STM/TPR to bil QL   PATIENT EDUCATION:  Education details: HEP Update 02/16/22, 03/02/22 Person educated: Patient Education method: Explanation Education comprehension: verbalized understanding   [facility] EXERCISE PROGRAM: [facility]:  CLINICAL IMPRESSION: Erik Salazar has made excellent progress, but still lacks full R knee extension (10 deg seated, 5 deg supine), and reports tightness, 3/10 R knee pain.  Tolerated progression of resistance for strengthening today without difficulty, incorporated standing exercises leaning against counter to bias glutes and lumbar paraspinals without pressure on abdomen for core strengthening also, and mobilized R femur to improve extension during runner's stretch.  Erik Salazar continues to demonstrate potential for improvement and would benefit from continued skilled therapy for additional 2 weeks to 03/30/22 to address impairments.      OBJECTIVE IMPAIRMENTS Abnormal gait, decreased endurance, decreased mobility, difficulty walking, decreased ROM, decreased strength, increased edema, increased fascial restrictions, increased muscle spasms, impaired flexibility, and pain.    ACTIVITY LIMITATIONS carrying, lifting, bending, sitting, standing, sleeping, stairs, transfers, and locomotion level  PARTICIPATION LIMITATIONS: driving, community activity, and yard work  PERSONAL FACTORS Past/current  experiences, Transportation, and 3+ comorbidities: History chronic back pain, multiple back surgeries, L  knee replacement, DVT, CVA frontal lobe with memory deficits, seizures, open heart surgery to repair aneurism, CABG x 2 , ACDF, hernia repair,omental flap graft to mediastinum.  are also affecting patient's functional outcome.   REHAB POTENTIAL: Good  CLINICAL DECISION MAKING: Evolving/moderate complexity  EVALUATION COMPLEXITY: Moderate   GOALS: Goals reviewed with patient? Yes   SHORT TERM GOALS: Target date: 02/15/2022    Independent with initial HEP. Baseline: compliant with HEP from HHPT, able to verbalize and demonstrate exercises correctly, performs 3x/day.  Goal status: MET  2.  Pt. Will report 75% improvement in R knee pain Baseline: constant 5-7/10 Goal status: MET - 80% improvement per pt (03/06/22)   LONG TERM GOALS: Target date: 03/15/2022 extended to 03/30/2022  Independent with advanced/ongoing HEP to improve outcomes and carryover.  Baseline: needs advancement Goal status: IN PROGRESS  2.  Erik Salazar will demonstrate R knee flexion to 120 deg to ascend/descend stairs. Baseline: R knee flexion 110 passive, 100 active Goal status: MET  125  3.  Erik Salazar will demonstrate full R knee extension for safety with gait. Baseline: lacking 10 deg  Goal status: IN PROGRESS - 03/13/22- lacking 5 deg.   4.  Erik Salazar will be able to ambulate 600' safely with LRAD and normal gait pattern to access community.  Baseline: using SPC, decreased gait speed.  Goal status: MET  5.  Erik Salazar will be able to ascend/descend stairs with 1 HR and reciprocal step pattern safely to access home and community.  Baseline: unable Goal status: MET  6.   Erik Salazar will demonstrate > 19/24 on DGI to demonstrate decreased risk of falls.   Baseline: NT today Goal status: MET  03/09/22- 27/30 on FGA  7.  Erik Salazar will demonstrate improved functional LE strength by completing 5x STS in <13 seconds without UE assist Baseline: 13.5 sec with UE assist.  Cannot stand without UE assist.  Goal status: MET 11 seconds without UE assist.   8.  Erik Salazar will demonstrate <40% disability on LEFS to demonstrate improved mobility.  Baseline: 40/80 = 50% disability Goal status: MET  03/09/22- 73/80 = 9% disability  9.  Erik Salazar will be able to return to walking 5K daily without limitation from R knee pain or LBP.  Baseline: unable Goal status: IN PROGRESS walking 3-4k steps each morning, but still has back and knee pain.     PLAN: PT FREQUENCY: 2x/week  PT DURATION: 6 weeks extending additional 2 weeks to 03/30/2022  PLANNED INTERVENTIONS: Therapeutic exercises, Therapeutic activity, Neuromuscular re-education, Balance training, Gait training, Patient/Family education, Joint mobilization, Stair training, Dry Needling, Cryotherapy, Moist heat, scar mobilization, Vasopneumatic device, Ultrasound, and Manual therapy  PLAN FOR NEXT SESSION: progress LE strengthening and ROM as tolerated, focus on knee extension, modalities PRN.  Also progress core strengthening as tolerated - prefers standing exercises.      Rennie Natter, PT, DPT 03/16/2022, 8:57 AM

## 2022-03-19 ENCOUNTER — Ambulatory Visit: Payer: Medicare HMO | Admitting: Cardiothoracic Surgery

## 2022-03-19 ENCOUNTER — Ambulatory Visit
Admission: RE | Admit: 2022-03-19 | Discharge: 2022-03-19 | Disposition: A | Discharge status: Home / Self Care | Payer: Medicare HMO | Source: Ambulatory Visit | Attending: Cardiothoracic Surgery | Admitting: Cardiothoracic Surgery

## 2022-03-19 ENCOUNTER — Other Ambulatory Visit: Payer: Medicare HMO

## 2022-03-19 DIAGNOSIS — I712 Thoracic aortic aneurysm, without rupture, unspecified: Secondary | ICD-10-CM

## 2022-03-19 MED ORDER — IOPAMIDOL (ISOVUE-370) INJECTION 76%
75.0000 mL | Freq: Once | INTRAVENOUS | Status: AC | PRN
  Administered 2022-03-19: 75 mL via INTRAVENOUS

## 2022-03-21 ENCOUNTER — Ambulatory Visit: Payer: Medicare HMO | Attending: Orthopaedic Surgery

## 2022-03-21 DIAGNOSIS — M5442 Lumbago with sciatica, left side: Secondary | ICD-10-CM | POA: Insufficient documentation

## 2022-03-21 DIAGNOSIS — R252 Cramp and spasm: Secondary | ICD-10-CM | POA: Insufficient documentation

## 2022-03-21 DIAGNOSIS — R262 Difficulty in walking, not elsewhere classified: Secondary | ICD-10-CM | POA: Diagnosis present

## 2022-03-21 DIAGNOSIS — G8929 Other chronic pain: Secondary | ICD-10-CM | POA: Insufficient documentation

## 2022-03-21 DIAGNOSIS — M25561 Pain in right knee: Secondary | ICD-10-CM | POA: Diagnosis present

## 2022-03-21 DIAGNOSIS — M25661 Stiffness of right knee, not elsewhere classified: Secondary | ICD-10-CM | POA: Insufficient documentation

## 2022-03-21 DIAGNOSIS — M6281 Muscle weakness (generalized): Secondary | ICD-10-CM | POA: Diagnosis present

## 2022-03-21 DIAGNOSIS — R600 Localized edema: Secondary | ICD-10-CM | POA: Insufficient documentation

## 2022-03-21 NOTE — Therapy (Signed)
OUTPATIENT PHYSICAL THERAPY TREATMENT  Patient Name: Erik Salazar MRN: [id number] DOB:[birth year], 71 y.o., male Today's Date: 03/21/2022   PT End of Session - 03/21/22 0848     Visit Number 13    Number of Visits 16    Date for PT Re-Evaluation 03/30/22    Authorization Type [employer]    Progress Note Due on Visit 16    PT Start Time 0800    PT Stop Time 0845    PT Time Calculation (min) 45 min    Activity Tolerance Patient tolerated treatment well    Behavior During Therapy [facility] for tasks assessed/performed                 Past Medical History:  Diagnosis Date   Arthritis    RA and OA   Carpal tunnel syndrome on right 07/08/2017   Cervical radiculopathy at C6 63/14/9702   Right   Complication of anesthesia    Coronary artery disease    Enlarged prostate    Fibromyalgia    H/O: knee surgery    15    History of blood clots    History of open heart surgery    ASCENDING AORTIC ANEURYSM   Ischemic stroke of frontal lobe ([place]) 03/14/2017   Bilateral; post-redo CT surgery   Pneumonia    PONV (postoperative nausea and vomiting)    only after CABG surgeries   Seizure disorder ([place]) 03/14/2017   Seizures ([place])    Status post knee surgery    DVT POST KNEE SURGERY   Stroke [facility])    Past Surgical History:  Procedure Laterality Date   ANTERIOR CERVICAL DECOMP/DISCECTOMY FUSION N/A 05/04/2020   Procedure: Anterior Cervical Decompression/Discectomy Fuion Cervical three-four, Cervical four-five, Cervical five-six;  Surgeon: Kary Kos, MD;  Location: [place];  Service: Neurosurgery;  Laterality: N/A;   BALLOON DILATION N/A 06/23/2019   Procedure: BALLOON DILATION;  Surgeon: Otis Brace, MD;  Location: [facility];  Service: Gastroenterology;  Laterality: N/A;   BENTALL PROCEDURE  01/04/2016   Bentall with 23 mm pericardial AVR; SVG-LAD, SVG-CX ([place])   BICEPS TENDON REPAIR Right    BIOPSY  06/23/2019   Procedure: BIOPSY;  Surgeon:  Otis Brace, MD;  Location: [facility];  Service: Gastroenterology;;   CARDIAC SURGERY     ANUERSYM MAY 2017   Bentall procedure. Bioprosthetic aortic valve #23 mm bovine model #2700 TF ask, and 28 mm Gelweave woven vascular sinus of Valsalva graft   CARPAL TUNNEL RELEASE  08/2018   right hand    CORONARY ARTERY BYPASS GRAFT  01/04/2016   VG to LAD & VG to LCX   CORONARY ARTERY BYPASS GRAFT  03/13/2017   LIMA to LAD with steril abcess removal from dacron graft   ESOPHAGOGASTRODUODENOSCOPY     Had done dilatation done about 2 or 3 times before in [place]   ESOPHAGOGASTRODUODENOSCOPY (EGD) WITH PROPOFOL N/A 06/23/2019   Procedure: ESOPHAGOGASTRODUODENOSCOPY (EGD) WITH PROPOFOL;  Surgeon: Otis Brace, MD;  [facility];  Service: Gastroenterology;  Laterality: N/A;   FALSE ANEURYSM REPAIR  03/13/2017   redo sternotomy, sterile abscess removal from Dacron graft, omental flap around aorta, CABG: LIMA-LAD ([name], Dr. Mart Piggs)   [place]  2018   Of pericardium 2018   INSERTION OF MESH  10/14/2020   Procedure: INSERTION OF MESH;  Surgeon: Michael Boston, MD;  Location: [place] [facility];  Service: General;;   knee surgeries     13 surgeries on knee done  before knee replacement    KNEE SURGERY  1983   LAPAROSCOPIC LYSIS OF ADHESIONS N/A 05/19/2021   Procedure: LYSIS OF ADHESIONS;  Surgeon: Michael Boston, MD;  Location: [place];  Service: General;  Laterality: N/A;  GEN & LOCAL   LYSIS OF ADHESION N/A 10/14/2020   Procedure: LYSIS OF ADHESION;  Surgeon: Michael Boston, MD;  Location: [place];  Service: General;  Laterality: N/A;   open heart surgery     03-13-2017   REPLACEMENT TOTAL KNEE Left 2015   ROTATOR CUFF REPAIR Right    done with biceps tendon repair   TONSILLECTOMY     removed as a child.   TOTAL KNEE ARTHROPLASTY Right 01/18/2022   Procedure: RIGHT TOTAL KNEE ARTHROPLASTY;  Surgeon: Leandrew Koyanagi, MD;  Location: [place];  Service: Orthopedics;  Laterality:  Right;   VENTRAL HERNIA REPAIR N/A 10/14/2020   Procedure: LAPAROSCOPIC VENTRAL HERNIA REPAIR WITH TAP BLOCK BILATERAL;  Surgeon: Michael Boston, MD;  Location: [place];  Service: General;  Laterality: N/A;   VENTRAL HERNIA REPAIR N/A 05/19/2021   Procedure: LAPAROSCOPIC VENTRAL WALL HERNIA REPAIR;  Surgeon: Michael Boston, MD;  Location: [place];  Service: General;  Laterality: N/A;   Patient Active Problem List   Diagnosis Date Noted   Gait abnormality 01/29/2022   Status post total right knee replacement 01/18/2022   Coronary artery disease involving native coronary artery of native heart without angina pectoris 01/04/2022   Preop cardiovascular exam 01/04/2022   Primary osteoarthritis of right knee 01/04/2022   Acute right-sided low back pain without sciatica 12/25/2021   Aortic atherosclerosis ([place]) 09/04/2021   Spondylolisthesis at L4-L5 level 06/28/2021   S/P repair of ventral hernia 05/19/2021   S/P repair of recurrent ventral hernia 05/19/2021   Benign prostatic hyperplasia without lower urinary tract symptoms 10/14/2020   Chronic pain 10/14/2020   ED (erectile dysfunction) of organic origin 10/14/2020   Esophageal dysphagia 10/14/2020   Gastroesophageal reflux disease 10/14/2020   History of aortic valve replacement with bioprosthetic valve 10/14/2020   Hx of aortic aneurysm repair 10/14/2020   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   Thoracic aortic aneurysm without rupture ([place]) 10/14/2020   Recurrent incisional hernia 10/14/2020   Spinal stenosis in cervical region 05/04/2020   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   Cervical radiculopathy at C6 11/14/2017   Carpal tunnel syndrome on right 07/08/2017   Tremor, essential 07/08/2017   Ischemic stroke of frontal lobe ([place]) 04/05/2017   Pain in right hand 04/05/2017   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 03/27/2017   Seizure ([place]) 03/14/2017   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/13/2017   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[place])  03/12/2017   Essential hypertension 02/26/2017   Mixed hyperlipidemia 02/26/2017    PCP: Marrian Salvage, FNP  REFERRING PROVIDER: Leandrew Koyanagi, MD  REFERRING DIAG: s/p R TKA 01/18/2022  THERAPY DIAG:  Acute pain of right knee  Stiffness of right knee, not elsewhere classified  Difficulty in walking, not elsewhere classified  Localized edema  Muscle weakness (generalized)  Cramp and spasm  Chronic left-sided low back pain with left-sided sciatica  Rationale for Evaluation and Treatment Rehabilitation  ONSET DATE: 01/18/2022  SUBJECTIVE:   SUBJECTIVE STATEMENT: Pt reports back pain is 7 or 8 today. The R knee just feels stiff today.  PERTINENT HISTORY: History chronic back pain, multiple back surgeries, L  knee replacement, DVT, CVA frontal lobe with memory deficits, seizures, open heart surgery to repair aneurism, CABG x 2 ,  ACDF, hernia repair,omental flap graft to mediastinum.  PAIN:  Are you having pain? Yes: NPRS scale: 7/10 Pain location: low back Pain description: constant ache  PRECAUTIONS: None  WEIGHT BEARING RESTRICTIONS No  FALLS:  Has patient fallen in last 6 months? No  LIVING ENVIRONMENT: Lives with: lives with their spouse Lives in: House/apartment Stairs: No Has following equipment at home: Single point cane, Environmental consultant - 2 wheeled, and bed side commode  OCCUPATION: retired, exercises frequently  PLOF: [place] get back to normal, do 5k every morning on treadmill   OBJECTIVE:   DIAGNOSTIC FINDINGS: DG R knee 01/18/2022 FINDINGS: Right femoral and tibial components are well situated. Expected postoperative changes are seen in the soft tissues anteriorly.  PATIENT SURVEYS:  LEFS 40/80= 50% disability   COGNITION:  Overall cognitive status: Within functional limits for tasks assessed     SENSATION: WFL  EDEMA:  Circumferential: midcalf R 39.0 cm, L 36.5 cm   POSTURE: decreased lumbar  lordosis  PALPATION: Noted increased edema R calf, but no tenderness with palpation/squeeze test, no s/s of DVT.  Incision covered with bandage still.  No s/s of infection.  Bruising from nerve block R thigh.    LOWER EXTREMITY ROM:  Active ROM Right eval Left eval Right  02/09/22 Right  02/13/22 Right 02/27/22 Right 03/13/22 Right 03/21/22  Knee flexion 100 sitting 110 supine (passive) 122 sitting 130 supine 120 supine 125 supine 125    Knee extension 10 0 8 7 4 5 4    (Blank rows = not tested)  LOWER EXTREMITY MMT:  MMT Right eval Left eval Right 03/16/22  Hip flexion 3+ 5 5  Hip extension 4+ 4   Hip abduction 5 5   Hip adduction 5 5   Knee flexion 4+ 5 5  Knee extension 2+ quad lag 12 deg 5 5  Ankle dorsiflexion 5 5    (Blank rows = not tested)  HAMSTRING: measured through popliteal angle lacking 50 deg R, 20 deg L.   FUNCTIONAL TESTS:  5 times sit to stand: 13.5 sec with UE assist 03/09/2022 - 5x STS 11 seconds, no UE assist.  FGA 27/30  GAIT: Distance walked: 36' Assistive device utilized: Single point cane Level of assistance: Modified independence Comments: antalgic, decreased stance time, heel strike on R, decreased gait speed 0.65 m/s, wide BOS   03/09/22- gait speed  1.2 m/s, no AD, good heel strike bil. Stairs - ascending reciprocal step no HR.  Descending reciprocal step, HR.   TODAY'S TREATMENT: 03/21/22 Therapeutic Exercise: to improve strength and mobility.   Bike L3x90mn Leg press 35# 2x15 BLE - slow eccentric lowering  R LE leg press 2x15  Knee extension BLE 2x15, 25#, 3 sec pause at top Knee flexion BLE 2x15, 35#, 3 sec pause on concentric  Manual Therapy: to decrease muscle spasm and pain and improve mobility PROM to R knee flex and ext Contract and relax both ways  03/16/22 Therapeutic Exercise: to improve strength and mobility.   Bike L3 x 8 min  (with MHP for back) Cybex knee extension 30# 2 x 15 BLE, 15# 2 x 15 RLE  Cybex knee flexion 30#  2 x 15 BLE, 15# 2 x 15 RLE  Cybex leg press 35# 2 x 15 BLE,  Cybex toe press 35# 2 x 15 BLE Hip extension 4# ankle weights, leaning against counter 2 x 15 bil  Runners stretch - with PT ER femur on tibia to improve extension 5 x  15 sec hol Calf stretch to improve ankle DF 3 x 30 sec hold Vasopneumatic: Gameready at end of session for post session soreness/edema.  10 min, low compression, 34 deg (coldest).    03/13/2022 Therapeutic Exercise: to improve strength and mobility.  Bike L3 x 8.5 min  (2 miles) Cybex knee extension 25# 1 x 15, 30# 1 x 15 BLE, 10# 1 x 15 RLE Cybex knee flexion  30# 3 x 15 BLE, 10# 1 x 15 RLE Cybex leg press 25# 1 x 15 BLE, 1 x 15 RLE, 1 x 15 toe press Manual Therapy: to decrease muscle spasm and pain and improve mobility STM/TPR to hamstrings, skilled palpation and monitoring during dry needling.  PA mobs tibia on femur, patellar mobs, scar tissue mobilization, extension overpressure with IR femur and ER of tibia, to improve knee extension.  Trigger Point Dry-Needling  Treatment instructions: Expect mild to moderate muscle soreness.  Patient verbalized understanding of these instructions and education.  Patient Consent Given: Yes Education handout provided: Previously provided Muscles treated: R hamstrings med and lateral Treatment response/outcome: Twitch Response Elicited and Palpable Increase in Muscle Length Vasopneumatic: Gameready at end of session for post session soreness/edema.  10 min, low compression, 34 deg (coldest), seated position with RLE extended, added 6lb weight to thigh for further extension stretch.      PATIENT EDUCATION:  Education details: HEP Update 02/16/22, 03/02/22 Person educated: Patient Education method: Explanation Education comprehension: verbalized understanding   HOME EXERCISE PROGRAM: Access Code: [email or web address]  ASSESSMENT:  CLINICAL IMPRESSION: Pt demonstrates a little improvement with knee ext ROM. Continued to progress  strengthening with adding progressive slow eccentric lowering to leg press and isometric holds to knee ext and flex. Good response during MT, with less tightness and muscle guarding noted. Pt would continue to benefit from progressing knee ext ROM and functional strengthening for knee.    OBJECTIVE IMPAIRMENTS Abnormal gait, decreased endurance, decreased mobility, difficulty walking, decreased ROM, decreased strength, increased edema, increased fascial restrictions, increased muscle spasms, impaired flexibility, and pain.   ACTIVITY LIMITATIONS carrying, lifting, bending, sitting, standing, sleeping, stairs, transfers, and locomotion level  PARTICIPATION LIMITATIONS: driving, community activity, and yard work  [place] Past/current experiences, Transportation, and 3+ comorbidities: History chronic back pain, multiple back surgeries, L  knee replacement, DVT, CVA frontal lobe with memory deficits, seizures, open heart surgery to repair aneurism, CABG x 2 , ACDF, hernia repair,omental flap graft to mediastinum.  are also affecting patient's functional outcome.   REHAB POTENTIAL: Good  CLINICAL DECISION MAKING: Evolving/moderate complexity  EVALUATION COMPLEXITY: Moderate   GOALS: Goals reviewed with patient? Yes   SHORT TERM GOALS: Target date: 02/15/2022    Independent with initial HEP. Baseline: compliant with HEP from HHPT, able to verbalize and demonstrate exercises correctly, performs 3x/day.  Goal status: MET  2.  Pt. Will report 75% improvement in R knee pain Baseline: constant 5-7/10 Goal status: MET - 80% improvement per pt (03/06/22)   LONG TERM GOALS: Target date: 03/15/2022 extended to 03/30/2022  Independent with advanced/ongoing HEP to improve outcomes and carryover.  Baseline: needs advancement Goal status: IN PROGRESS  2.  Damond Nudo will demonstrate R knee flexion to 120 deg to ascend/descend stairs. Baseline: R knee flexion 110 passive, 100 active Goal  status: MET  125  3.  Kerim Watkin will demonstrate full R knee extension for safety with gait. Baseline: lacking 10 deg  Goal status: IN PROGRESS - 03/13/22- lacking 5 deg.   4.  Nahmir  Mctigue will be able to ambulate 600' safely with LRAD and normal gait pattern to access community.  Baseline: using SPC, decreased gait speed.  Goal status: MET  5.  Jaysun Styron will be able to ascend/descend stairs with 1 HR and reciprocal step pattern safely to access home and community.  Baseline: unable Goal status: MET  6.  Darvin Gavidia will demonstrate > 19/24 on DGI to demonstrate decreased risk of falls.   Baseline: NT today Goal status: MET  03/09/22- 27/30 on FGA  7.  Trentin Meine will demonstrate improved functional LE strength by completing 5x STS in <13 seconds without UE assist Baseline: 13.5 sec with UE assist.  Cannot stand without UE assist.  Goal status: MET 11 seconds without UE assist.   8.  Ulysee Custard will demonstrate <40% disability on LEFS to demonstrate improved mobility.  Baseline: 40/80 = 50% disability Goal status: MET  03/09/22- 73/80 = 9% disability  9.  Deleon Toran will be able to return to walking 5K daily without limitation from R knee pain or LBP.  Baseline: unable Goal status: IN PROGRESS walking 3-4k steps each morning, but still has back and knee pain.     PLAN: PT FREQUENCY: 2x/week  PT DURATION: 6 weeks extending additional 2 weeks to 03/30/2022  PLANNED INTERVENTIONS: Therapeutic exercises, Therapeutic activity, Neuromuscular re-education, Balance training, Gait training, Patient/Family education, Joint mobilization, Stair training, Dry Needling, Cryotherapy, Moist heat, scar mobilization, Vasopneumatic device, Ultrasound, and Manual therapy  PLAN FOR NEXT SESSION: progress LE strengthening and ROM as tolerated, focus on knee extension, modalities PRN.  Also progress core strengthening as tolerated - prefers standing exercises.      Artist Pais, PTA 03/21/2022, 8:48 AM

## 2022-03-26 ENCOUNTER — Ambulatory Visit (INDEPENDENT_AMBULATORY_CARE_PROVIDER_SITE_OTHER): Payer: Medicare HMO | Admitting: Cardiothoracic Surgery

## 2022-03-26 ENCOUNTER — Ambulatory Visit: Payer: Medicare HMO | Admitting: Physical Therapy

## 2022-03-26 ENCOUNTER — Encounter: Payer: Self-pay | Admitting: Cardiothoracic Surgery

## 2022-03-26 ENCOUNTER — Encounter: Payer: Self-pay | Admitting: Physical Therapy

## 2022-03-26 DIAGNOSIS — R252 Cramp and spasm: Secondary | ICD-10-CM

## 2022-03-26 DIAGNOSIS — M25561 Pain in right knee: Secondary | ICD-10-CM | POA: Diagnosis not present

## 2022-03-26 DIAGNOSIS — G8929 Other chronic pain: Secondary | ICD-10-CM

## 2022-03-26 DIAGNOSIS — R262 Difficulty in walking, not elsewhere classified: Secondary | ICD-10-CM

## 2022-03-26 DIAGNOSIS — R600 Localized edema: Secondary | ICD-10-CM

## 2022-03-26 DIAGNOSIS — Z09 Encounter for follow-up examination after completed treatment for conditions other than malignant neoplasm: Secondary | ICD-10-CM

## 2022-03-26 DIAGNOSIS — M6281 Muscle weakness (generalized): Secondary | ICD-10-CM

## 2022-03-26 DIAGNOSIS — M25661 Stiffness of right knee, not elsewhere classified: Secondary | ICD-10-CM

## 2022-03-26 NOTE — Therapy (Signed)
OUTPATIENT PHYSICAL THERAPY TREATMENT  Patient Name: Erik Salazar MRN: [id number] DOB:04/02/1951, 71 y.o., male Today's Date: 03/26/2022   PT End of Session - 03/26/22 0804     Visit Number 14    Number of Visits 16    Date for PT Re-Evaluation 03/30/22    Authorization Type [employer]    Progress Note Due on Visit 25    PT Start Time 0801    PT Stop Time 0830    PT Time Calculation (min) 29 min    Activity Tolerance Patient tolerated treatment well    Behavior During Therapy [facility] for tasks assessed/performed                 Past Medical History:  Diagnosis Date   Arthritis    RA and OA   Carpal tunnel syndrome on right 07/08/2017   Cervical radiculopathy at C6 44/81/8563   Right   Complication of anesthesia    Coronary artery disease    Enlarged prostate    Fibromyalgia    H/O: knee surgery    15    History of blood clots    History of open heart surgery    ASCENDING AORTIC ANEURYSM   Ischemic stroke of frontal lobe ([place]) 03/14/2017   Bilateral; post-redo CT surgery   Pneumonia    PONV (postoperative nausea and vomiting)    only after CABG surgeries   Seizure disorder ([place]) 03/14/2017   Seizures ([place])    Status post knee surgery    DVT POST KNEE SURGERY   Stroke [facility])    Past Surgical History:  Procedure Laterality Date   ANTERIOR CERVICAL DECOMP/DISCECTOMY FUSION N/A 05/04/2020   Procedure: Anterior Cervical Decompression/Discectomy Fuion Cervical three-four, Cervical four-five, Cervical five-six;  Surgeon: Kary Kos, MD;  Location: [place];  Service: Neurosurgery;  Laterality: N/A;   BALLOON DILATION N/A 06/23/2019   Procedure: BALLOON DILATION;  Surgeon: Otis Brace, MD;  Location: [facility];  Service: Gastroenterology;  Laterality: N/A;   BENTALL PROCEDURE  01/04/2016   Bentall with 23 mm pericardial AVR; SVG-LAD, SVG-CX ([place])   BICEPS TENDON REPAIR Right    BIOPSY  06/23/2019   Procedure: BIOPSY;  Surgeon:  Otis Brace, MD;  Location: [facility];  Service: Gastroenterology;;   CARDIAC SURGERY     ANUERSYM MAY 2017   Bentall procedure. Bioprosthetic aortic valve #23 mm bovine model #2700 TF ask, and 28 mm Gelweave woven vascular sinus of Valsalva graft   CARPAL TUNNEL RELEASE  08/2018   right hand    CORONARY ARTERY BYPASS GRAFT  01/04/2016   VG to LAD & VG to LCX   CORONARY ARTERY BYPASS GRAFT  03/13/2017   LIMA to LAD with steril abcess removal from dacron graft   ESOPHAGOGASTRODUODENOSCOPY     Had done dilatation done about 2 or 3 times before in [place]   ESOPHAGOGASTRODUODENOSCOPY (EGD) WITH PROPOFOL N/A 06/23/2019   Procedure: ESOPHAGOGASTRODUODENOSCOPY (EGD) WITH PROPOFOL;  Surgeon: Otis Brace, MD;  [facility];  Service: Gastroenterology;  Laterality: N/A;   FALSE ANEURYSM REPAIR  03/13/2017   redo sternotomy, sterile abscess removal from Dacron graft, omental flap around aorta, CABG: LIMA-LAD ([name], Dr. Mart Piggs)   [place]  2018   Of pericardium 2018   INSERTION OF MESH  10/14/2020   Procedure: INSERTION OF MESH;  Surgeon: Michael Boston, MD;  Location: [place] [facility];  Service: General;;   knee surgeries     13 surgeries on knee done  before knee replacement    KNEE SURGERY  1983   LAPAROSCOPIC LYSIS OF ADHESIONS N/A 05/19/2021   Procedure: LYSIS OF ADHESIONS;  Surgeon: Michael Boston, MD;  Location: [place];  Service: General;  Laterality: N/A;  GEN & LOCAL   LYSIS OF ADHESION N/A 10/14/2020   Procedure: LYSIS OF ADHESION;  Surgeon: Michael Boston, MD;  Location: [place];  Service: General;  Laterality: N/A;   open heart surgery     03-13-2017   REPLACEMENT TOTAL KNEE Left 2015   ROTATOR CUFF REPAIR Right    done with biceps tendon repair   TONSILLECTOMY     removed as a child.   TOTAL KNEE ARTHROPLASTY Right 01/18/2022   Procedure: RIGHT TOTAL KNEE ARTHROPLASTY;  Surgeon: Leandrew Koyanagi, MD;  Location: [place];  Service: Orthopedics;  Laterality:  Right;   VENTRAL HERNIA REPAIR N/A 10/14/2020   Procedure: LAPAROSCOPIC VENTRAL HERNIA REPAIR WITH TAP BLOCK BILATERAL;  Surgeon: Michael Boston, MD;  Location: [place];  Service: General;  Laterality: N/A;   VENTRAL HERNIA REPAIR N/A 05/19/2021   Procedure: LAPAROSCOPIC VENTRAL WALL HERNIA REPAIR;  Surgeon: Michael Boston, MD;  Location: [place];  Service: General;  Laterality: N/A;   Patient Active Problem List   Diagnosis Date Noted   Gait abnormality 01/29/2022   Status post total right knee replacement 01/18/2022   Coronary artery disease involving native coronary artery of native heart without angina pectoris 01/04/2022   Preop cardiovascular exam 01/04/2022   Primary osteoarthritis of right knee 01/04/2022   Acute right-sided low back pain without sciatica 12/25/2021   Aortic atherosclerosis ([place]) 09/04/2021   Spondylolisthesis at L4-L5 level 06/28/2021   S/P repair of ventral hernia 05/19/2021   S/P repair of recurrent ventral hernia 05/19/2021   Benign prostatic hyperplasia without lower urinary tract symptoms 10/14/2020   Chronic pain 10/14/2020   ED (erectile dysfunction) of organic origin 10/14/2020   Esophageal dysphagia 10/14/2020   Gastroesophageal reflux disease 10/14/2020   History of aortic valve replacement with bioprosthetic valve 10/14/2020   Hx of aortic aneurysm repair 10/14/2020   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   Thoracic aortic aneurysm without rupture ([place]) 10/14/2020   Recurrent incisional hernia 10/14/2020   Spinal stenosis in cervical region 05/04/2020   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   Cervical radiculopathy at C6 11/14/2017   Carpal tunnel syndrome on right 07/08/2017   Tremor, essential 07/08/2017   Ischemic stroke of frontal lobe ([place]) 04/05/2017   Pain in right hand 04/05/2017   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 03/27/2017   Seizure ([place]) 03/14/2017   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/13/2017   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[place])  03/12/2017   Essential hypertension 02/26/2017   Mixed hyperlipidemia 02/26/2017    PCP: Marrian Salvage, FNP  REFERRING PROVIDER: Leandrew Koyanagi, MD  REFERRING DIAG: s/p R TKA 01/18/2022  THERAPY DIAG:  Acute pain of right knee  Stiffness of right knee, not elsewhere classified  Difficulty in walking, not elsewhere classified  Localized edema  Muscle weakness (generalized)  Cramp and spasm  Chronic left-sided low back pain with left-sided sciatica  Rationale for Evaluation and Treatment Rehabilitation  ONSET DATE: 01/18/2022  SUBJECTIVE:   SUBJECTIVE STATEMENT: Pt reports back pain is 10 today, didn't sleep well last night.  Has to leave early to see cardiothoracic surgeon about an aneurism.   PERTINENT HISTORY: History chronic back pain, multiple back surgeries, L  knee replacement, DVT, CVA frontal lobe with memory deficits, seizures,  open heart surgery to repair aneurism, CABG x 2 , ACDF, hernia repair,omental flap graft to mediastinum.  PAIN:  Are you having pain? Yes: NPRS scale: 10/10 Pain location: low back Pain description: constant ache  PRECAUTIONS: None  WEIGHT BEARING RESTRICTIONS No  FALLS:  Has patient fallen in last 6 months? No  LIVING ENVIRONMENT: Lives with: lives with their spouse Lives in: House/apartment Stairs: No Has following equipment at home: Single point cane, Environmental consultant - 2 wheeled, and bed side commode  OCCUPATION: retired, exercises frequently  PLOF: [place] get back to normal, do 5k every morning on treadmill   OBJECTIVE:   DIAGNOSTIC FINDINGS: DG R knee 01/18/2022 FINDINGS: Right femoral and tibial components are well situated. Expected postoperative changes are seen in the soft tissues anteriorly.  PATIENT SURVEYS:  LEFS 40/80= 50% disability   COGNITION:  Overall cognitive status: Within functional limits for tasks assessed     SENSATION: WFL  EDEMA:  Circumferential: midcalf R 39.0 cm, L  36.5 cm   POSTURE: decreased lumbar lordosis  PALPATION: Noted increased edema R calf, but no tenderness with palpation/squeeze test, no s/s of DVT.  Incision covered with bandage still.  No s/s of infection.  Bruising from nerve block R thigh.    LOWER EXTREMITY ROM:  Active ROM Right eval Left eval Right  02/09/22 Right  02/13/22 Right 02/27/22 Right 03/13/22 Right 03/21/22  Knee flexion 100 sitting 110 supine (passive) 122 sitting 130 supine 120 supine 125 supine 125    Knee extension 10 0 _0 (Blank rows = not tested)  LOWER EXTREMITY MMT:  MMT Right eval Left eval Right 03/16/22  Hip flexion 3+ 5 5  Hip extension 4+ 4   Hip abduction 5 5   Hip adduction 5 5   Knee flexion 4+ 5 5  Knee extension 2+ quad lag 12 deg 5 5  Ankle dorsiflexion 5 5    (Blank rows = not tested)  HAMSTRING: measured through popliteal angle lacking 50 deg R, 20 deg L.   FUNCTIONAL TESTS:  5 times sit to stand: 13.5 sec with UE assist 03/09/2022 - 5x STS 11 seconds, no UE assist.  FGA 27/30  GAIT: Distance walked: 32' Assistive device utilized: Single point cane Level of assistance: Modified independence Comments: antalgic, decreased stance time, heel strike on R, decreased gait speed 0.65 m/s, wide BOS   03/09/22- gait speed  1.2 m/s, no AD, good heel strike bil. Stairs - ascending reciprocal step no HR.  Descending reciprocal step, HR.   TODAY'S TREATMENT: 03/26/2022 Therapeutic Exercise: to improve strength and mobility.  Demo, verbal and tactile cues throughout for technique. Bike L3 x 5 min  Leg Press #35 1 x 15 BLE, RLE only 2 x 15, toe press 35# 2 x 15  Calf stretch on step x 2 min  Runners stretch - with PT ER tibia on femur to improve extension 5 x 15 sec hol Knee extension 45# 1 x 10, #35 1 x 8 - starting BLE concentric phase then dropping LLE to focus on eccentric phase with RLE, cues for breathing.  Knee flexion 35#  2 x 15 BLE   03/21/22 Therapeutic Exercise: to  improve strength and mobility.   Bike L3x73mn Leg press 35# 2x15 BLE - slow eccentric lowering  R LE leg press 2x15  Knee extension BLE 2x15, 25#, 3 sec pause at top Knee flexion BLE 2x15, 35#, 3 sec pause on concentric Runners  stretch - with PT ER tibia on femur to improve extension 5 x 15 sec hol  Manual Therapy: to decrease muscle spasm and pain and improve mobility PROM to R knee flex and ext Contract and relax both ways  03/16/22 Therapeutic Exercise: to improve strength and mobility.   Bike L3 x 8 min  (with MHP for back) Cybex knee extension 30# 2 x 15 BLE, 15# 2 x 15 RLE  Cybex knee flexion 30# 2 x 15 BLE, 15# 2 x 15 RLE  Cybex leg press 35# 2 x 15 BLE,  Cybex toe press 35# 2 x 15 BLE Hip extension 4# ankle weights, leaning against counter 2 x 15 bil  Runners stretch - with PT ER femur on tibia to improve extension 5 x 15 sec hol Calf stretch to improve ankle DF 3 x 30 sec hold Vasopneumatic: Gameready at end of session for post session soreness/edema.  10 min, low compression, 34 deg (coldest).    03/13/2022 Therapeutic Exercise: to improve strength and mobility.  Bike L3 x 8.5 min  (2 miles) Cybex knee extension 25# 1 x 15, 30# 1 x 15 BLE, 10# 1 x 15 RLE Cybex knee flexion  30# 3 x 15 BLE, 10# 1 x 15 RLE Cybex leg press 25# 1 x 15 BLE, 1 x 15 RLE, 1 x 15 toe press Manual Therapy: to decrease muscle spasm and pain and improve mobility STM/TPR to hamstrings, skilled palpation and monitoring during dry needling.  PA mobs tibia on femur, patellar mobs, scar tissue mobilization, extension overpressure with IR femur and ER of tibia, to improve knee extension.  Trigger Point Dry-Needling  Treatment instructions: Expect mild to moderate muscle soreness.  Patient verbalized understanding of these instructions and education.  Patient Consent Given: Yes Education handout provided: Previously provided Muscles treated: R hamstrings med and lateral Treatment response/outcome: Twitch  Response Elicited and Palpable Increase in Muscle Length Vasopneumatic: Gameready at end of session for post session soreness/edema.  10 min, low compression, 34 deg (coldest), seated position with RLE extended, added 6lb weight to thigh for further extension stretch.      PATIENT EDUCATION:  Education details: HEP Update 02/16/22, 03/02/22 Person educated: Patient Education method: Explanation Education comprehension: verbalized understanding   HOME EXERCISE PROGRAM: Access Code: [facility]  ASSESSMENT:  CLINICAL IMPRESSION: Session shortened slightly due to another medical appointment this AM.  Continued focusing in RLE eccentric strengthening and improving knee extension.  Asheton Klarich continues to demonstrate potential for improvement and would benefit from continued skilled therapy to address impairments.     OBJECTIVE IMPAIRMENTS Abnormal gait, decreased endurance, decreased mobility, difficulty walking, decreased ROM, decreased strength, increased edema, increased fascial restrictions, increased muscle spasms, impaired flexibility, and pain.   ACTIVITY LIMITATIONS carrying, lifting, bending, sitting, standing, sleeping, stairs, transfers, and locomotion level  PARTICIPATION LIMITATIONS: driving, community activity, and yard work  [place] Past/current experiences, Transportation, and 3+ comorbidities: History chronic back pain, multiple back surgeries, L  knee replacement, DVT, CVA frontal lobe with memory deficits, seizures, open heart surgery to repair aneurism, CABG x 2 , ACDF, hernia repair,omental flap graft to mediastinum.  are also affecting patient's functional outcome.   REHAB POTENTIAL: Good  CLINICAL DECISION MAKING: Evolving/moderate complexity  EVALUATION COMPLEXITY: Moderate   GOALS: Goals reviewed with patient? Yes   SHORT TERM GOALS: Target date: 02/15/2022    Independent with initial HEP. Baseline: compliant with HEP from HHPT, able to verbalize  and demonstrate exercises correctly, performs 3x/day.  Goal  status: MET  2.  Pt. Will report 75% improvement in R knee pain Baseline: constant 5-7/10 Goal status: MET - 80% improvement per pt (03/06/22)   LONG TERM GOALS: Target date: 03/15/2022 extended to 03/30/2022  Independent with advanced/ongoing HEP to improve outcomes and carryover.  Baseline: needs advancement Goal status: IN PROGRESS  2.  Rein Grandison will demonstrate R knee flexion to 120 deg to ascend/descend stairs. Baseline: R knee flexion 110 passive, 100 active Goal status: MET  125  3.  Saivion Bilski will demonstrate full R knee extension for safety with gait. Baseline: lacking 10 deg  Goal status: IN PROGRESS - 03/13/22- lacking 5 deg.   4.  Raymundo Marsan will be able to ambulate 600' safely with LRAD and normal gait pattern to access community.  Baseline: using SPC, decreased gait speed.  Goal status: MET  5.  Devlin Prue will be able to ascend/descend stairs with 1 HR and reciprocal step pattern safely to access home and community.  Baseline: unable Goal status: MET  6.  Benancio Limbach will demonstrate > 19/24 on DGI to demonstrate decreased risk of falls.   Baseline: NT today Goal status: MET  03/09/22- 27/30 on FGA  7.  Hawthorne Ahrendt will demonstrate improved functional LE strength by completing 5x STS in <13 seconds without UE assist Baseline: 13.5 sec with UE assist.  Cannot stand without UE assist.  Goal status: MET 11 seconds without UE assist.   8.  Woody Newport will demonstrate <40% disability on LEFS to demonstrate improved mobility.  Baseline: 40/80 = 50% disability Goal status: MET  03/09/22- 73/80 = 9% disability  9.  Harriet Munroe will be able to return to walking 5K daily without limitation from R knee pain or LBP.  Baseline: unable Goal status: IN PROGRESS walking 3-4k steps each morning, but still has back and knee pain.     PLAN: PT FREQUENCY: 2x/week  PT DURATION: 6 weeks extending  additional 2 weeks to 03/30/2022  PLANNED INTERVENTIONS: Therapeutic exercises, Therapeutic activity, Neuromuscular re-education, Balance training, Gait training, Patient/Family education, Joint mobilization, Stair training, Dry Needling, Cryotherapy, Moist heat, scar mobilization, Vasopneumatic device, Ultrasound, and Manual therapy  PLAN FOR NEXT SESSION: progress LE strengthening and ROM as tolerated, focus on knee extension, modalities PRN.  Also progress core strengthening as tolerated - prefers standing exercises.      Rennie Natter, PT, DPT  03/26/2022, 8:34 AM

## 2022-03-26 NOTE — Progress Notes (Signed)
HPI: Patient returns for scheduled annual visit with CTA.  Patient is status post Bentall procedure several years ago in [place] and subsequent reexploration at [facility] in 2018 for possible pseudoaneurysm and replacement of one of the 2 vein grafts with a mammary to the LAD.  There is no pseudoaneurysm but there was a fluid collection and this was drained and a omental flap was placed.  The patient is now followed annually with a CTA.  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  He has no angina.  There is a small 8 to 10 mm outpouching/pseudoaneurysm at the suture line of the ascending graft which has been stable and asymptomatic.  I reviewed the most recent CTA performed within the past few days and there is no change.  The aortic repair looks fine.  Patient had a right total knee replacement about 2 months ago and is doing well.  Current Outpatient Medications  Medication Sig Dispense Refill   ascorbic acid (VITAMIN C) 500 MG tablet Take 500 mg by mouth daily.     Cholecalciferol (VITAMIN D3) 50 MCG (2000 UT) TABS Take 2,000 Units by mouth daily.     COVID-19 mRNA bivalent vaccine, Pfizer, (PFIZER COVID-19 VAC BIVALENT) injection Inject into the muscle. 0.3 mL 0   inclisiran (LEQVIO) 284 MG/1.5ML SOSY injection Inject 1.60m at 0 months, 3 months and then every 6 months thereafter. 1.5 mL 0   levETIRAcetam (KEPPRA) 1000 MG tablet Take 1 tablet (1,000 mg total) by mouth 2 (two) times daily. 180 tablet 4   metoprolol succinate (TOPROL-XL) 25 MG 24 hr tablet Take 1/2 tablet (12.5 mg total) by mouth daily. With or immediately following a meal. 45 tablet 3   pravastatin (PRAVACHOL) 40 MG tablet Take 1 tablet (40 mg total) by mouth every evening. 90 tablet 3   pyridOXINE (VITAMIN B-6) 100 MG tablet Take 100 mg by mouth daily.     sildenafil (REVATIO) 20 MG tablet Take 2 -3 tablets by mouth once daily as needed 30 tablet 1   tamsulosin (FLOMAX) 0.4 MG CAPS capsule Take 2 capsules by mouth every day 180  capsule 1   No current facility-administered medications for this visit.     Physical Exam:  Blood pressure 130/79, pulse 81, resp. rate 20, height '5\' 10"'$  (1.778 m), weight 194 lb 1.8 oz (88 kg), SpO2 97 %.        Exam    General- alert and comfortable    Neck- no JVD, no cervical adenopathy palpable, no carotid bruit   Lungs- clear without rales, wheezes   Cor- regular rate and rhythm, no murmur , gallop   Abdomen- soft, non-tender   Extremities - warm, non-tender, minimal edema   Neuro- oriented, appropriate, no focal weakness   Diagnostic Tests:  CTA performed within the past few days personally reviewed with results as noted above.  Impression: Patient doing well. No criteria present  for repeat cardiac surgery. He will need annual surveillance scans of his aortic repair, maintain systolic blood pressure less than 140, and continue with a heart healthy lifestyle and diet.  Return in 1 year for repeat CTA.  Plan: Return in 1 year for repeat CTA   PDahlia Byes MD [facility] [phone]

## 2022-03-26 NOTE — Patient Instructions (Signed)
30

## 2022-03-27 ENCOUNTER — Other Ambulatory Visit (HOSPITAL_BASED_OUTPATIENT_CLINIC_OR_DEPARTMENT_OTHER): Payer: Self-pay

## 2022-03-30 ENCOUNTER — Ambulatory Visit: Payer: Medicare HMO

## 2022-03-30 DIAGNOSIS — M25561 Pain in right knee: Secondary | ICD-10-CM | POA: Diagnosis not present

## 2022-03-30 DIAGNOSIS — M25661 Stiffness of right knee, not elsewhere classified: Secondary | ICD-10-CM

## 2022-03-30 DIAGNOSIS — R600 Localized edema: Secondary | ICD-10-CM

## 2022-03-30 DIAGNOSIS — M6281 Muscle weakness (generalized): Secondary | ICD-10-CM

## 2022-03-30 DIAGNOSIS — R252 Cramp and spasm: Secondary | ICD-10-CM

## 2022-03-30 DIAGNOSIS — R262 Difficulty in walking, not elsewhere classified: Secondary | ICD-10-CM

## 2022-03-30 DIAGNOSIS — G8929 Other chronic pain: Secondary | ICD-10-CM

## 2022-03-30 NOTE — Therapy (Addendum)
OUTPATIENT PHYSICAL THERAPY TREATMENT  PHYSICAL THERAPY DISCHARGE SUMMARY  Visits from Start of Care: 15  Current functional level related to goals / functional outcomes: Improved R knee ROM (2-125), strength, and pain.  Met LTG #1,2, 4, 5, 6, 7, 8, 9.  Reports only 9% disability on LEFS.    Remaining deficits: Still lacking 2 degrees full R knee extension   Education / Equipment: HEP  Plan: Patient agrees to discharge. Patient is being discharged due to meeting the stated rehab goals.     Rennie Natter, PT, DPT 9:50 AM, 03/30/2022    Patient Name: Erik Salazar MRN: [id number] DOB:12/09/1950, 71 y.o., male, male Today's Date: 03/30/2022   PT End of Session - 03/30/22 0841     Visit Number 15    Number of Visits 16    Date for PT Re-Evaluation 03/30/22    Authorization Type [employer]    Progress Note Due on Visit 23    PT Start Time 0801    PT Stop Time 0841    PT Time Calculation (min) 40 min    Activity Tolerance Patient tolerated treatment well    Behavior During Therapy [facility] for tasks assessed/performed                  Past Medical History:  Diagnosis Date   Arthritis    RA and OA   Carpal tunnel syndrome on right 07/08/2017   Cervical radiculopathy at C6 27/74/1287   Right   Complication of anesthesia    Coronary artery disease    Enlarged prostate    Fibromyalgia    H/O: knee surgery    15    History of blood clots    History of open heart surgery    ASCENDING AORTIC ANEURYSM   Ischemic stroke of frontal lobe ([place]) 03/14/2017   Bilateral; post-redo CT surgery   Pneumonia    PONV (postoperative nausea and vomiting)    only after CABG surgeries   Seizure disorder ([place]) 03/14/2017   Seizures ([place])    Status post knee surgery    DVT POST KNEE SURGERY   Stroke [facility])    Past Surgical History:  Procedure Laterality Date   ANTERIOR CERVICAL DECOMP/DISCECTOMY FUSION N/A 05/04/2020   Procedure: Anterior Cervical Decompression/Discectomy  Fuion Cervical three-four, Cervical four-five, Cervical five-six;  Surgeon: Kary Kos, MD;  Location: [place];  Service: Neurosurgery;  Laterality: N/A;   BALLOON DILATION N/A 06/23/2019   Procedure: BALLOON DILATION;  Surgeon: Otis Brace, MD;  Location: [facility];  Service: Gastroenterology;  Laterality: N/A;   BENTALL PROCEDURE  01/04/2016   Bentall with 23 mm pericardial AVR; SVG-LAD, SVG-CX ([place])   BICEPS TENDON REPAIR Right    BIOPSY  06/23/2019   Procedure: BIOPSY;  Surgeon: Otis Brace, MD;  Location: [facility];  Service: Gastroenterology;;   CARDIAC SURGERY     ANUERSYM MAY 2017   Bentall procedure. Bioprosthetic aortic valve #23 mm bovine model #2700 TF ask, and 28 mm Gelweave woven vascular sinus of Valsalva graft   CARPAL TUNNEL RELEASE  08/2018   right hand    CORONARY ARTERY BYPASS GRAFT  01/04/2016   VG to LAD & VG to LCX   CORONARY ARTERY BYPASS GRAFT  03/13/2017   LIMA to LAD with steril abcess removal from dacron graft   ESOPHAGOGASTRODUODENOSCOPY     Had done dilatation done about 2 or 3 times before in [place]   ESOPHAGOGASTRODUODENOSCOPY (EGD) WITH PROPOFOL N/A 06/23/2019  Procedure: ESOPHAGOGASTRODUODENOSCOPY (EGD) WITH PROPOFOL;  Surgeon: Otis Brace, MD;  [facility];  Service: Gastroenterology;  Laterality: N/A;   FALSE ANEURYSM REPAIR  03/13/2017   redo sternotomy, sterile abscess removal from Dacron graft, omental flap around aorta, CABG: LIMA-LAD ([name], Dr. Mart Piggs)   [place]  2018   Of pericardium 2018   INSERTION OF MESH  10/14/2020   Procedure: INSERTION OF MESH;  Surgeon: Michael Boston, MD;  Location: [place];  Service: General;;   knee surgeries     13 surgeries on knee done before knee replacement    [place] N/A 05/19/2021   Procedure: LYSIS OF ADHESIONS;  Surgeon: Michael Boston, MD;  Location: [place];  Service: General;   Laterality: N/A;  GEN & LOCAL   LYSIS OF ADHESION N/A 10/14/2020   Procedure: LYSIS OF ADHESION;  Surgeon: Michael Boston, MD;  Location: [place];  Service: General;  Laterality: N/A;   open heart surgery     03-13-2017   REPLACEMENT TOTAL KNEE Left 2015   ROTATOR CUFF REPAIR Right    done with biceps tendon repair   TONSILLECTOMY     removed as a child.   TOTAL KNEE ARTHROPLASTY Right 01/18/2022   Procedure: RIGHT TOTAL KNEE ARTHROPLASTY;  Surgeon: Leandrew Koyanagi, MD;  Location: [place];  Service: Orthopedics;  Laterality: Right;   VENTRAL HERNIA REPAIR N/A 10/14/2020   Procedure: LAPAROSCOPIC VENTRAL HERNIA REPAIR WITH TAP BLOCK BILATERAL;  Surgeon: Michael Boston, MD;  Location: [place];  Service: General;  Laterality: N/A;   VENTRAL HERNIA REPAIR N/A 05/19/2021   Procedure: LAPAROSCOPIC VENTRAL WALL HERNIA REPAIR;  Surgeon: Michael Boston, MD;  Location: [place];  Service: General;  Laterality: N/A;   Patient Active Problem List   Diagnosis Date Noted   Postop check 03/26/2022   Gait abnormality 01/29/2022   Status post total right knee replacement 01/18/2022   Coronary artery disease involving native coronary artery of native heart without angina pectoris 01/04/2022   Preop cardiovascular exam 01/04/2022   Primary osteoarthritis of right knee 01/04/2022   Acute right-sided low back pain without sciatica 12/25/2021   Aortic atherosclerosis ([place]) 09/04/2021   Spondylolisthesis at L4-L5 level 06/28/2021   S/P repair of ventral hernia 05/19/2021   S/P repair of recurrent ventral hernia 05/19/2021   Benign prostatic hyperplasia without lower urinary tract symptoms 10/14/2020   Chronic pain 10/14/2020   ED (erectile dysfunction) of organic origin 10/14/2020   Esophageal dysphagia 10/14/2020   Gastroesophageal reflux disease 10/14/2020   History of aortic valve replacement with bioprosthetic valve 10/14/2020   Hx of aortic aneurysm repair 10/14/2020   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    Thoracic aortic aneurysm without rupture ([place]) 10/14/2020   Recurrent incisional hernia 10/14/2020   Spinal stenosis in cervical region 05/04/2020   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   Cervical radiculopathy at C6 11/14/2017   Carpal tunnel syndrome on right 07/08/2017   Tremor, essential 07/08/2017   Ischemic stroke of frontal lobe ([place]) 04/05/2017   Pain in right hand 04/05/2017   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 03/27/2017   Seizure ([place]) 03/14/2017   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/13/2017   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[place]) 03/12/2017   Essential hypertension 02/26/2017   Mixed hyperlipidemia 02/26/2017    PCP: Marrian Salvage, FNP  REFERRING PROVIDER: Leandrew Koyanagi, MD  REFERRING DIAG: s/p R TKA 01/18/2022  THERAPY DIAG:  Acute  pain of right knee  Stiffness of right knee, not elsewhere classified  Difficulty in walking, not elsewhere classified  Localized edema  Muscle weakness (generalized)  Cramp and spasm  Chronic left-sided low back pain with left-sided sciatica  Rationale for Evaluation and Treatment Rehabilitation  ONSET DATE: 01/18/2022  SUBJECTIVE:   SUBJECTIVE STATEMENT: Pt reports last doctor visit went well. "They said that the aneurism is nothing to worry about."  PERTINENT HISTORY: History chronic back pain, multiple back surgeries, L  knee replacement, DVT, CVA frontal lobe with memory deficits, seizures, open heart surgery to repair aneurism, CABG x 2 , ACDF, hernia repair,omental flap graft to mediastinum.  PAIN:  Are you having pain? Yes: NPRS scale: 2/10 Pain location: R knee; low back 7/10 Pain description: constant ache  PRECAUTIONS: None  WEIGHT BEARING RESTRICTIONS No  FALLS:  Has patient fallen in last 6 months? No  LIVING ENVIRONMENT: Lives with: lives with their spouse Lives in: House/apartment Stairs: No Has following equipment at home: Single point cane, Environmental consultant - 2 wheeled, and bed side  commode  OCCUPATION: retired, exercises frequently  PLOF: [place] get back to normal, do 5k every morning on treadmill   OBJECTIVE:   DIAGNOSTIC FINDINGS: DG R knee 01/18/2022 FINDINGS: Right femoral and tibial components are well situated. Expected postoperative changes are seen in the soft tissues anteriorly.  PATIENT SURVEYS:  LEFS 40/80= 50% disability   COGNITION:  Overall cognitive status: Within functional limits for tasks assessed     SENSATION: WFL  EDEMA:  Circumferential: midcalf R 39.0 cm, L 36.5 cm   POSTURE: decreased lumbar lordosis  PALPATION: Noted increased edema R calf, but no tenderness with palpation/squeeze test, no s/s of DVT.  Incision covered with bandage still.  No s/s of infection.  Bruising from nerve block R thigh.    LOWER EXTREMITY ROM:  Active ROM Right eval Left eval Right  02/09/22 Right  02/13/22 Right 02/27/22 Right 03/13/22 Right 03/21/22 Right 03/30/22  Knee flexion 100 sitting 110 supine (passive) 122 sitting 130 supine 120 supine 125 supine 125   125  Knee extension 10 0 8 7 4 5 4 2    (Blank rows = not tested)  LOWER EXTREMITY MMT:  MMT Right eval Left eval Right 03/16/22  Hip flexion 3+ 5 5  Hip extension 4+ 4   Hip abduction 5 5   Hip adduction 5 5   Knee flexion 4+ 5 5  Knee extension 2+ quad lag 12 deg 5 5  Ankle dorsiflexion 5 5    (Blank rows = not tested)  HAMSTRING: measured through popliteal angle lacking 50 deg R, 20 deg L.   FUNCTIONAL TESTS:  5 times sit to stand: 13.5 sec with UE assist 03/09/2022 - 5x STS 11 seconds, no UE assist.  FGA 27/30  GAIT: Distance walked: 93' Assistive device utilized: Single point cane Level of assistance: Modified independence Comments: antalgic, decreased stance time, heel strike on R, decreased gait speed 0.65 m/s, wide BOS   03/09/22- gait speed  1.2 m/s, no AD, good heel strike bil. Stairs - ascending reciprocal step no HR.  Descending reciprocal  step, HR.   TODAY'S TREATMENT: 03/30/22 Therapeutic Exercise: Bike L4x30mn Step ups on 8' step x 10 Knee ext w/ green TB x 10  Manual Therapy: PROM into R knee extension  03/26/2022 Therapeutic Exercise: to improve strength and mobility.  Demo, verbal and tactile cues throughout for technique. Bike L3 x 5 min  Leg Press #  35 1 x 15 BLE, RLE only 2 x 15, toe press 35# 2 x 15  Calf stretch on step x 2 min  Runners stretch - with PT ER tibia on femur to improve extension 5 x 15 sec hol Knee extension 45# 1 x 10, #35 1 x 8 - starting BLE concentric phase then dropping LLE to focus on eccentric phase with RLE, cues for breathing.  Knee flexion 35#  2 x 15 BLE   03/21/22 Therapeutic Exercise: to improve strength and mobility.   Bike L3x63mn Leg press 35# 2x15 BLE - slow eccentric lowering  R LE leg press 2x15  Knee extension BLE 2x15, 25#, 3 sec pause at top Knee flexion BLE 2x15, 35#, 3 sec pause on concentric Runners stretch - with PT ER tibia on femur to improve extension 5 x 15 sec hol  Manual Therapy: to decrease muscle spasm and pain and improve mobility PROM to R knee flex and ext Contract and relax both ways  03/16/22 Therapeutic Exercise: to improve strength and mobility.   Bike L3 x 8 min  (with MHP for back) Cybex knee extension 30# 2 x 15 BLE, 15# 2 x 15 RLE  Cybex knee flexion 30# 2 x 15 BLE, 15# 2 x 15 RLE  Cybex leg press 35# 2 x 15 BLE,  Cybex toe press 35# 2 x 15 BLE Hip extension 4# ankle weights, leaning against counter 2 x 15 bil  Runners stretch - with PT ER femur on tibia to improve extension 5 x 15 sec hol Calf stretch to improve ankle DF 3 x 30 sec hold Vasopneumatic: Gameready at end of session for post session soreness/edema.  10 min, low compression, 34 deg (coldest).        PATIENT EDUCATION:  Education details: HEP Update  Person educated: Patient Education method: EConsulting civil engineer Demonstration, and Handouts Education comprehension: verbalized  understanding and returned demonstration   HOME EXERCISE PROGRAM: Access Code: [email or web address]  ASSESSMENT:  CLINICAL IMPRESSION: Pt has now met all goals with regard to the R knee except LTG for TKE. Knee extension was able to get to 2 deg today after manual. He is able to walk the 5k w/ no limitation from LBP or knee pain but does have LBP while walking. Reviewed HEP and updated to include functional strength and quad focused exercises to continue strengthening R knee. He is pleased w/ progress, will be D/C as of today for R TKA but will return to focus PT on back.   OBJECTIVE IMPAIRMENTS Abnormal gait, decreased endurance, decreased mobility, difficulty walking, decreased ROM, decreased strength, increased edema, increased fascial restrictions, increased muscle spasms, impaired flexibility, and pain.   ACTIVITY LIMITATIONS carrying, lifting, bending, sitting, standing, sleeping, stairs, transfers, and locomotion level  PARTICIPATION LIMITATIONS: driving, community activity, and yard work  PPotomac HeightsPast/current experiences, Transportation, and 3+ comorbidities: History chronic back pain, multiple back surgeries, L  knee replacement, DVT, CVA frontal lobe with memory deficits, seizures, open heart surgery to repair aneurism, CABG x 2 , ACDF, hernia repair,omental flap graft to mediastinum.  are also affecting patient's functional outcome.   REHAB POTENTIAL: Good  CLINICAL DECISION MAKING: Evolving/moderate complexity  EVALUATION COMPLEXITY: Moderate   GOALS: Goals reviewed with patient? Yes   SHORT TERM GOALS: Target date: 02/15/2022    Independent with initial HEP. Baseline: compliant with HEP from HHPT, able to verbalize and demonstrate exercises correctly, performs 3x/day.  Goal status: MET  2.  Pt. Will report 75% improvement in R knee  pain Baseline: constant 5-7/10 Goal status: MET - 80% improvement per pt (03/06/22)   LONG TERM GOALS: Target date: 03/15/2022 extended to  03/30/2022  Independent with advanced/ongoing HEP to improve outcomes and carryover.  Baseline: needs advancement Goal status: MET  2.  Viraat Obyrne will demonstrate R knee flexion to 120 deg to ascend/descend stairs. Baseline: R knee flexion 110 passive, 100 active Goal status: MET  125  3.  Exodus Hargens will demonstrate full R knee extension for safety with gait. Baseline: lacking 10 deg  Goal status: IN PROGRESS -03/30/22 (2 deg ext)  4.  Yan Reichenbach will be able to ambulate 600' safely with LRAD and normal gait pattern to access community.  Baseline: using SPC, decreased gait speed.  Goal status: MET  5.  Numa Bonaventura will be able to ascend/descend stairs with 1 HR and reciprocal step pattern safely to access home and community.  Baseline: unable Goal status: MET  6.  Armando Bellard will demonstrate > 19/24 on DGI to demonstrate decreased risk of falls.   Baseline: NT today Goal status: MET  03/09/22- 27/30 on FGA  7.  Aziel Kestner will demonstrate improved functional LE strength by completing 5x STS in <13 seconds without UE assist Baseline: 13.5 sec with UE assist.  Cannot stand without UE assist.  Goal status: MET 11 seconds without UE assist.   8.  Jafar Spikes will demonstrate <40% disability on LEFS to demonstrate improved mobility.  Baseline: 40/80 = 50% disability Goal status: MET  03/09/22- 73/80 = 9% disability  9.  Carlitos Victorino will be able to return to walking 5K daily without limitation from R knee pain or LBP.  Baseline: unable Goal status: MET - 03/30/22     PLAN: PT FREQUENCY: 2x/week  PT DURATION: 6 weeks extending additional 2 weeks to 03/30/2022  PLANNED INTERVENTIONS: Therapeutic exercises, Therapeutic activity, Neuromuscular re-education, Balance training, Gait training, Patient/Family education, Joint mobilization, Stair training, Dry Needling, Cryotherapy, Moist heat, scar mobilization, Vasopneumatic device, Ultrasound, and Manual  therapy  PLAN FOR NEXT SESSION: progress LE strengthening and ROM as tolerated, focus on knee extension, modalities PRN.  Also progress core strengthening as tolerated - prefers standing exercises.      Artist Pais, PTA 03/30/2022, 8:41 AM   Rennie Natter, PT, DPT

## 2022-04-03 ENCOUNTER — Encounter: Payer: Self-pay | Admitting: Physical Therapy

## 2022-04-03 ENCOUNTER — Ambulatory Visit: Payer: Medicare HMO | Attending: Student | Admitting: Physical Therapy

## 2022-04-03 DIAGNOSIS — R252 Cramp and spasm: Secondary | ICD-10-CM | POA: Diagnosis present

## 2022-04-03 DIAGNOSIS — M6281 Muscle weakness (generalized): Secondary | ICD-10-CM | POA: Insufficient documentation

## 2022-04-03 DIAGNOSIS — M5442 Lumbago with sciatica, left side: Secondary | ICD-10-CM | POA: Diagnosis present

## 2022-04-03 DIAGNOSIS — G8929 Other chronic pain: Secondary | ICD-10-CM | POA: Diagnosis present

## 2022-04-03 NOTE — Therapy (Signed)
OUTPATIENT PHYSICAL THERAPY THORACOLUMBAR EVALUATION   Patient Name: Erik Salazar MRN: [id number] DOB:03/07/1951, 71 y.o., male Today's Date: 04/03/2022   PT End of Session - 04/03/22 0807     Visit Number 1    Number of Visits 12    Date for PT Re-Evaluation 05/15/22    Authorization Type [employer]    Progress Note Due on Visit 10    PT Start Time 0804    PT Stop Time 0845    PT Time Calculation (min) 41 min    Activity Tolerance Patient tolerated treatment well    Behavior During Therapy [name] for tasks assessed/performed             Past Medical History:  Diagnosis Date   Arthritis    RA and OA   Carpal tunnel syndrome on right 07/08/2017   Cervical radiculopathy at C6 79/89/2119   Right   Complication of anesthesia    Coronary artery disease    Enlarged prostate    Fibromyalgia    H/O: knee surgery    15    History of blood clots    History of open heart surgery    ASCENDING AORTIC ANEURYSM   Ischemic stroke of frontal lobe ([place]) 03/14/2017   Bilateral; post-redo CT surgery   Pneumonia    PONV (postoperative nausea and vomiting)    only after CABG surgeries   Seizure disorder ([place]) 03/14/2017   Seizures ([place])    Status post knee surgery    DVT POST KNEE SURGERY   Stroke [facility])    Past Surgical History:  Procedure Laterality Date   ANTERIOR CERVICAL DECOMP/DISCECTOMY FUSION N/A 05/04/2020   Procedure: Anterior Cervical Decompression/Discectomy Fuion Cervical three-four, Cervical four-five, Cervical five-six;  Surgeon: Kary Kos, MD;  Location: [place];  Service: Neurosurgery;  Laterality: N/A;   BALLOON DILATION N/A 06/23/2019   Procedure: BALLOON DILATION;  Surgeon: Otis Brace, MD;  Location: [facility];  Service: Gastroenterology;  Laterality: N/A;   BENTALL PROCEDURE  01/04/2016   Bentall with 23 mm pericardial AVR; SVG-LAD, SVG-CX ([employer])   BICEPS TENDON REPAIR Right    BIOPSY  06/23/2019   Procedure: BIOPSY;   Surgeon: Otis Brace, MD;  Location: [facility];  Service: Gastroenterology;;   CARDIAC SURGERY     ANUERSYM MAY 2017   Bentall procedure. Bioprosthetic aortic valve #23 mm bovine model #2700 TF ask, and 28 mm Gelweave woven vascular sinus of Valsalva graft   CARPAL TUNNEL RELEASE  08/2018   right hand    CORONARY ARTERY BYPASS GRAFT  01/04/2016   VG to LAD & VG to LCX   CORONARY ARTERY BYPASS GRAFT  03/13/2017   LIMA to LAD with steril abcess removal from dacron graft   ESOPHAGOGASTRODUODENOSCOPY     Had done dilatation done about 2 or 3 times before in [address]   ESOPHAGOGASTRODUODENOSCOPY (EGD) WITH PROPOFOL N/A 06/23/2019   Procedure: ESOPHAGOGASTRODUODENOSCOPY (EGD) WITH PROPOFOL;  Surgeon: Otis Brace, MD;  Location: [facility];  Service: Gastroenterology;  Laterality: N/A;   FALSE ANEURYSM REPAIR  03/13/2017   redo sternotomy, sterile abscess removal from Dacron graft, omental flap around aorta, CABG: LIMA-LAD ([name], Dr. Mart Piggs)   [place]  2018   Of pericardium 2018   INSERTION OF MESH  10/14/2020   Procedure: INSERTION OF MESH;  Surgeon: Michael Boston, MD;  Location: [place] [facility];  Service: General;;   knee surgeries     13 surgeries on knee done before knee  replacement    KNEE SURGERY  1983   LAPAROSCOPIC LYSIS OF ADHESIONS N/A 05/19/2021   Procedure: LYSIS OF ADHESIONS;  Surgeon: Michael Boston, MD;  Location: [place];  Service: General;  Laterality: N/A;  GEN & LOCAL   LYSIS OF ADHESION N/A 10/14/2020   Procedure: LYSIS OF ADHESION;  Surgeon: Michael Boston, MD;  Location: [place];  Service: General;  Laterality: N/A;   open heart surgery     03-13-2017   REPLACEMENT TOTAL KNEE Left 2015   ROTATOR CUFF REPAIR Right    done with biceps tendon repair   TONSILLECTOMY     removed as a child.   TOTAL KNEE ARTHROPLASTY Right 01/18/2022   Procedure: RIGHT TOTAL KNEE ARTHROPLASTY;  Surgeon: Leandrew Koyanagi, MD;  Location: [place];  Service: Orthopedics;   Laterality: Right;   VENTRAL HERNIA REPAIR N/A 10/14/2020   Procedure: LAPAROSCOPIC VENTRAL HERNIA REPAIR WITH TAP BLOCK BILATERAL;  Surgeon: Michael Boston, MD;  Location: [place];  Service: General;  Laterality: N/A;   VENTRAL HERNIA REPAIR N/A 05/19/2021   Procedure: LAPAROSCOPIC VENTRAL WALL HERNIA REPAIR;  Surgeon: Michael Boston, MD;  Location: [place];  Service: General;  Laterality: N/A;   Patient Active Problem List   Diagnosis Date Noted   Postop check 03/26/2022   Gait abnormality 01/29/2022   Status post total right knee replacement 01/18/2022   Coronary artery disease involving native coronary artery of native heart without angina pectoris 01/04/2022   Preop cardiovascular exam 01/04/2022   Primary osteoarthritis of right knee 01/04/2022   Acute right-sided low back pain without sciatica 12/25/2021   Aortic atherosclerosis ([place]) 09/04/2021   Spondylolisthesis at L4-L5 level 06/28/2021   S/P repair of ventral hernia 05/19/2021   S/P repair of recurrent ventral hernia 05/19/2021   Benign prostatic hyperplasia without lower urinary tract symptoms 10/14/2020   Chronic pain 10/14/2020   ED (erectile dysfunction) of organic origin 10/14/2020   Esophageal dysphagia 10/14/2020   Gastroesophageal reflux disease 10/14/2020   History of aortic valve replacement with bioprosthetic valve 10/14/2020   Hx of aortic aneurysm repair 10/14/2020   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   Thoracic aortic aneurysm without rupture ([place]) 10/14/2020   Recurrent incisional hernia 10/14/2020   Spinal stenosis in cervical region 05/04/2020   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   Cervical radiculopathy at C6 11/14/2017   Carpal tunnel syndrome on right 07/08/2017   Tremor, essential 07/08/2017   Ischemic stroke of frontal lobe ([place]) 04/05/2017   Pain in right hand 04/05/2017   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 03/27/2017   Seizure ([place]) 03/14/2017   Presence of aortocoronary bypass graft  03/13/2017   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  He was told that was normal.  He has had two abdominalt surgeries last year and thinks this is also a factor.  He started PT for his back but had to stop to have TKA.  He feels core is very wek and bulges out when tries to do abdominal exercises.   Really having trouble sleeping only sleeping a couple of hours a night before waking due to pain.  Side sleeper.  Can't sleep on back.  PERTINENT HISTORY:  History chronic back pain, multiple back surgeries, bil  knee replacement (R knee on 01/18/2022), DVT, CVA frontal lobe with memory deficits, seizures, open heart surgery to repair aneurism, CABG x 2 , ACDF, hernia repair,omental flap graft to mediastinum  PAIN:  Are you having pain? Yes: NPRS scale: 7/10 Pain location: across low back  Pain description: shooting sharp pain, sometimes dull.  13/10 at night.  Aggravating factors: laying down, walking long distances, sitting >30 min  Relieving factors: changing positions constantly   PRECAUTIONS: None  WEIGHT BEARING RESTRICTIONS No  FALLS:  Has patient fallen in last 6 months? No  LIVING ENVIRONMENT: Lives with: lives with their spouse Lives in: House/apartment Stairs:  No Has following equipment at home: Single point cane, Environmental consultant - 2 wheeled, and bed side commode  OCCUPATION: retired Curator  PLOF: Independent  walks daily 8k steps/day, also goes to gym  PATIENT GOALS decrease low back pain, sleep better.    OBJECTIVE:   DIAGNOSTIC FINDINGS:  Lumbar spine 06/28/2021 IMPRESSION: Fluoroscopic assistance was provided for surgical fusion at L5-S1level.  PATIENT SURVEYS:  Modified Oswestry 20/50= 40% moderate disability    SCREENING FOR RED FLAGS: Bowel or bladder incontinence: No Spinal tumors: No Cauda equina syndrome: No Compression fracture: No Abdominal aneurysm: Yes: small, being monitored  COGNITION:  Overall cognitive status: History of cognitive impairments - at baseline for memory from CVA.      SENSATION: WFL  MUSCLE LENGTH: Hamstrings: Right 70 deg; Left 90 deg  POSTURE: decreased lumbar lordosis  PALPATION: Tenderness lumbar spine with PA mobs, L QL, L gluteus medius, R piriformis, R SIJ  LUMBAR ROM:   Active  AROM  eval  Flexion WNL **  Extension 75% limit **  Right lateral flexion To knee  Left lateral flexion To knee little pain  Right rotation WNL*  Left rotation WNL*   (Blank rows = not tested) *pain, **increased pain  LOWER EXTREMITY ROM:     Active  Right eval Left eval  Hip internal rotation 20 30  Hip external rotation 35 40  Knee flexion 120 130  Knee extension 5 0  Ankle dorsiflexion    Ankle plantarflexion     (Blank rows = not tested)  LOWER EXTREMITY MMT:  5/5 bil LE all myotomes.    LUMBAR SPECIAL TESTS:  Straight leg raise test: increased pain bilaterally, Slump test: Negative, and SI Compression/distraction test: Negative  FUNCTIONAL TESTS:  NT  GAIT: Distance walked: 66' Assistive device utilized: None Level of assistance: Complete [place] Comments: no significant deviation or device.  Walks 3-5,000 steps every morning.     TODAY'S TREATMENT   8/15/2023Therapeutic Exercise: to improve strength and mobility.  Demo, verbal and tactile cues throughout for technique. PPT 10 x 10 sec hold with cues. Hip IR/ER active stretch x 15    PATIENT EDUCATION:  Education details: findings, POC, initial HEP Person educated: Patient Education method: Consulting civil engineer, Demonstration, Verbal  cues, and Handouts Education comprehension: verbalized understanding and returned demonstration   HOME EXERCISE PROGRAM: Access Code: [email or web address]  ASSESSMENT:  CLINICAL IMPRESSION: Patient is a 71 y.o. male who was seen today for physical therapy evaluation and treatment for chronic low back pain.  He has been seen for low back pain here prior episodes, but has not been able to complete due to 1) illness then 2) R knee replacement.  His back pain has increased while recovering from R knee replacement surgery.  Pain is midline across low back.  He has history of several back surgeries with fusion, along with several abdominal surgeries due to hernia and aneurism.  He has a small abdominal aneurism currently, but this is being monitored.  Noted extremely poor core strength with abdominal bulging when changing position, but does not have diastasis recti or hernia.  He is unable to tolerate prone positioning due to abdominal surgeries.  He would benefit from skilled physical therapy to improve core strength and decrease LBP to improve QOL and sleep.      OBJECTIVE IMPAIRMENTS decreased activity tolerance, decreased mobility, decreased strength, increased fascial restrictions, impaired perceived functional ability, increased muscle spasms, improper body mechanics, postural dysfunction, and pain.   ACTIVITY LIMITATIONS carrying, lifting, bending, sitting, standing, sleeping, transfers, and bed mobility  PARTICIPATION LIMITATIONS: cleaning, laundry, shopping, community activity, and yard work  PERSONAL FACTORS Age, Time since onset of injury/illness/exacerbation, and 3+  comorbidities: recent R TKA, chronic LBP, abdominal surgeries, seizure disorder, memory impairments secondary to CVA  are also affecting patient's functional outcome.   REHAB POTENTIAL: Good  CLINICAL DECISION MAKING: Stable/uncomplicated  EVALUATION COMPLEXITY: Low   GOALS: Goals reviewed with patient? Yes  SHORT TERM GOALS: Target date: 04/17/2022   Patient will be independent with initial HEP.  Baseline: given Goal status: INITIAL   LONG TERM GOALS: Target date: 05/15/2022    Patient will be independent with advanced/ongoing HEP to improve outcomes and carryover.  Baseline: needs progression Goal status: INITIAL  2.  Patient will report 75% improvement in low back pain to improve QOL.  Baseline: 8-10/10 LBP Goal status: INITIAL  3.  Patient will report 75% improvement in sleep disruption due to low back pain.  Baseline: sleepless nightly 1-2 hours due to pain Goal status: INITIAL  4.  Patient will demonstrate full pain free lumbar ROM to perform ADLs.   Baseline: pain all directions Goal status: INITIAL  5.  Patient will demonstrate improved functional strength as demonstrated by no stomach bulging with bed mobility. Baseline: core weaknes Goal status: INITIAL  6.  Patient will report 15/50 or less on Oswestry to demonstrate improved functional ability.  Baseline: 20/50 = moderate disability  Goal status: INITIAL   7.  Patient will tolerate 45 min of sitting without increased LBP for traveling, eating meals, etc. Baseline: increased pain after 15 min sitting Goal status: INITIAL  8.  Patient will be able to transfer from floor to standing safely.  Baseline: unable Goal status: INITIAL    PLAN: PT FREQUENCY: 2x/week  PT DURATION: 6 weeks  PLANNED INTERVENTIONS: Therapeutic exercises, Therapeutic activity, Neuromuscular re-education, Balance training, Gait training, Patient/Family education, Self Care, Joint mobilization, Stair training, Dry Needling,  Electrical stimulation, Spinal mobilization, Moist heat, Taping, Traction, Ultrasound, Manual therapy, and Re-evaluation.  PLAN FOR NEXT SESSION: progress core strengthening exercises focusing on sitting/standing or supine.  Does not tolerate prone positioning.  Manual therapy and modalities PRN.    Rennie Natter, PT, DPT  04/03/2022, 1:35 PM

## 2022-04-05 ENCOUNTER — Ambulatory Visit: Payer: Medicare HMO

## 2022-04-05 DIAGNOSIS — G8929 Other chronic pain: Secondary | ICD-10-CM

## 2022-04-05 DIAGNOSIS — M25561 Pain in right knee: Secondary | ICD-10-CM | POA: Diagnosis not present

## 2022-04-05 DIAGNOSIS — M6281 Muscle weakness (generalized): Secondary | ICD-10-CM

## 2022-04-05 DIAGNOSIS — R252 Cramp and spasm: Secondary | ICD-10-CM

## 2022-04-05 NOTE — Therapy (Signed)
OUTPATIENT PHYSICAL THERAPY TREATMENT   Patient Name: Erik Salazar MRN: [id number] DOB:03/21/1951, 71 y.o., male Today's Date: 04/05/2022   PT End of Session - 04/05/22 0847     Visit Number 2    Number of Visits 12    Date for PT Re-Evaluation 05/15/22    Authorization Type [employer]    Progress Note Due on Visit 10    PT Start Time 0801    PT Stop Time 0845    PT Time Calculation (min) 44 min    Activity Tolerance Patient tolerated treatment well    Behavior During Therapy [facility] for tasks assessed/performed              Past Medical History:  Diagnosis Date   Arthritis    RA and OA   Carpal tunnel syndrome on right 07/08/2017   Cervical radiculopathy at C6 82/42/3536   Right   Complication of anesthesia    Coronary artery disease    Enlarged prostate    Fibromyalgia    H/O: knee surgery    15    History of blood clots    History of open heart surgery    ASCENDING AORTIC ANEURYSM   Ischemic stroke of frontal lobe ([place]) 03/14/2017   Bilateral; post-redo CT surgery   Pneumonia    PONV (postoperative nausea and vomiting)    only after CABG surgeries   Seizure disorder ([place]) 03/14/2017   Seizures ([place])    Status post knee surgery    DVT POST KNEE SURGERY   Stroke [facility])    Past Surgical History:  Procedure Laterality Date   ANTERIOR CERVICAL DECOMP/DISCECTOMY FUSION N/A 05/04/2020   Procedure: Anterior Cervical Decompression/Discectomy Fuion Cervical three-four, Cervical four-five, Cervical five-six;  Surgeon: Kary Kos, MD;  Location: [place];  Service: Neurosurgery;  Laterality: N/A;   BALLOON DILATION N/A 06/23/2019   Procedure: BALLOON DILATION;  Surgeon: Otis Brace, MD;  Location: [facility];  Service: Gastroenterology;  Laterality: N/A;   BENTALL PROCEDURE  01/04/2016   Bentall with 23 mm pericardial AVR; SVG-LAD, SVG-CX ([place])   BICEPS TENDON REPAIR Right    BIOPSY  06/23/2019   Procedure: BIOPSY;  Surgeon:  Otis Brace, MD;  Location: [facility];  Service: Gastroenterology;;   CARDIAC SURGERY     ANUERSYM MAY 2017   Bentall procedure. Bioprosthetic aortic valve #23 mm bovine model #2700 TF ask, and 28 mm Gelweave woven vascular sinus of Valsalva graft   CARPAL TUNNEL RELEASE  08/2018   right hand    CORONARY ARTERY BYPASS GRAFT  01/04/2016   VG to LAD & VG to LCX   CORONARY ARTERY BYPASS GRAFT  03/13/2017   LIMA to LAD with steril abcess removal from dacron graft   ESOPHAGOGASTRODUODENOSCOPY     Had done dilatation done about 2 or 3 times before in [place]   ESOPHAGOGASTRODUODENOSCOPY (EGD) WITH PROPOFOL N/A 06/23/2019   Procedure: ESOPHAGOGASTRODUODENOSCOPY (EGD) WITH PROPOFOL;  Surgeon: Otis Brace, MD;  [facility];  Service: Gastroenterology;  Laterality: N/A;   FALSE ANEURYSM REPAIR  03/13/2017   redo sternotomy, sterile abscess removal from Dacron graft, omental flap around aorta, CABG: LIMA-LAD ([name], Dr. Mart Piggs)   [place]  2018   Of pericardium 2018   INSERTION OF MESH  10/14/2020   Procedure: INSERTION OF MESH;  Surgeon: Michael Boston, MD;  Location: [place] [facility];  Service: General;;   knee surgeries     13 surgeries on knee done before knee  replacement    KNEE SURGERY  1983   LAPAROSCOPIC LYSIS OF ADHESIONS N/A 05/19/2021   Procedure: LYSIS OF ADHESIONS;  Surgeon: Michael Boston, MD;  Location: [place];  Service: General;  Laterality: N/A;  GEN & LOCAL   LYSIS OF ADHESION N/A 10/14/2020   Procedure: LYSIS OF ADHESION;  Surgeon: Michael Boston, MD;  Location: [place];  Service: General;  Laterality: N/A;   open heart surgery     03-13-2017   REPLACEMENT TOTAL KNEE Left 2015   ROTATOR CUFF REPAIR Right    done with biceps tendon repair   TONSILLECTOMY     removed as a child.   TOTAL KNEE ARTHROPLASTY Right 01/18/2022   Procedure: RIGHT TOTAL KNEE ARTHROPLASTY;  Surgeon: Leandrew Koyanagi, MD;  Location: [place];  Service: Orthopedics;  Laterality:  Right;   VENTRAL HERNIA REPAIR N/A 10/14/2020   Procedure: LAPAROSCOPIC VENTRAL HERNIA REPAIR WITH TAP BLOCK BILATERAL;  Surgeon: Michael Boston, MD;  Location: [place];  Service: General;  Laterality: N/A;   VENTRAL HERNIA REPAIR N/A 05/19/2021   Procedure: LAPAROSCOPIC VENTRAL WALL HERNIA REPAIR;  Surgeon: Michael Boston, MD;  Location: [place];  Service: General;  Laterality: N/A;   Patient Active Problem List   Diagnosis Date Noted   Postop check 03/26/2022   Gait abnormality 01/29/2022   Status post total right knee replacement 01/18/2022   Coronary artery disease involving native coronary artery of native heart without angina pectoris 01/04/2022   Preop cardiovascular exam 01/04/2022   Primary osteoarthritis of right knee 01/04/2022   Acute right-sided low back pain without sciatica 12/25/2021   Aortic atherosclerosis ([place]) 09/04/2021   Spondylolisthesis at L4-L5 level 06/28/2021   S/P repair of ventral hernia 05/19/2021   S/P repair of recurrent ventral hernia 05/19/2021   Benign prostatic hyperplasia without lower urinary tract symptoms 10/14/2020   Chronic pain 10/14/2020   ED (erectile dysfunction) of organic origin 10/14/2020   Esophageal dysphagia 10/14/2020   Gastroesophageal reflux disease 10/14/2020   History of aortic valve replacement with bioprosthetic valve 10/14/2020   Hx of aortic aneurysm repair 10/14/2020   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   Thoracic aortic aneurysm without rupture ([place]) 10/14/2020   Recurrent incisional hernia 10/14/2020   Spinal stenosis in cervical region 05/04/2020   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   Cervical radiculopathy at C6 11/14/2017   Carpal tunnel syndrome on right 07/08/2017   Tremor, essential 07/08/2017   Ischemic stroke of frontal lobe ([place]) 04/05/2017   Pain in right hand 04/05/2017   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 03/27/2017   Seizure ([place]) 03/14/2017   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/13/2017    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[place]) 03/12/2017   Essential hypertension 02/26/2017   Mixed hyperlipidemia 02/26/2017    PCP: Jerline Pain MD  REFERRING PROVIDER: Eleonore Chiquito, NP  REFERRING DIAG: M43.16 Spondylolisthesis, lumbar region  Rationale for Evaluation and Treatment Rehabilitation  THERAPY DIAG:  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 Yes: NPRS scale: 8/10 Pain location: across low back  Pain description: shooting sharp pain, sometimes dull.  13/10 at night.  Aggravating factors: laying down, walking long distances, sitting >30 min  Relieving factors: changing positions constantly   PRECAUTIONS: None  WEIGHT BEARING RESTRICTIONS No  FALLS:  Has patient fallen in last 6 months? No  LIVING ENVIRONMENT: Lives with: lives with their spouse Lives in: House/apartment Stairs: No Has following equipment at home: Single point cane, Environmental consultant - 2 wheeled, and bed side commode  OCCUPATION: retired Curator  PLOF: Independent  walks daily 8k steps/day, also goes to gym  PATIENT GOALS decrease low back pain, sleep better.    OBJECTIVE:   DIAGNOSTIC FINDINGS:  Lumbar spine 06/28/2021 IMPRESSION: Fluoroscopic assistance was provided for surgical fusion at  L5-S1level.  PATIENT SURVEYS:  Modified Oswestry 20/50= 40% moderate disability    SCREENING FOR RED FLAGS: Bowel or bladder incontinence: No Spinal tumors: No Cauda equina syndrome: No Compression fracture: No Abdominal aneurysm: Yes: small, being monitored  COGNITION:  Overall cognitive status: History of cognitive impairments - at baseline for memory from CVA.      SENSATION: WFL  MUSCLE LENGTH: Hamstrings: Right 70 deg; Left 90 deg  POSTURE: decreased lumbar lordosis  PALPATION: Tenderness lumbar spine with PA mobs, L QL, L gluteus medius, R piriformis, R SIJ  LUMBAR ROM:   Active  AROM  eval  Flexion WNL **  Extension 75% limit **  Right lateral flexion To knee  Left lateral flexion To knee little pain  Right rotation WNL*  Left rotation WNL*   (Blank rows = not tested) *pain, **increased pain  LOWER EXTREMITY ROM:     Active  Right eval Left eval  Hip internal rotation 20 30  Hip external rotation 35 40  Knee flexion 120 130  Knee extension 5 0  Ankle dorsiflexion    Ankle plantarflexion     (Blank rows = not tested)  LOWER EXTREMITY MMT:  5/5 bil LE all myotomes.    LUMBAR SPECIAL TESTS:  Straight leg raise test: increased pain bilaterally, Slump test: Negative, and SI Compression/distraction test: Negative  FUNCTIONAL TESTS:  NT  GAIT: Distance walked: 71' Assistive device utilized: None Level of assistance: Complete Independence Comments: no significant deviation or device.  Walks 3-5,000 steps every morning.     TODAY'S TREATMENT  04/05/22 Therapeutic Exercise: Nustep L4x79mn Supine PPT x 10 - cues for proper movement LTR 5x10" each side Supine TrA brace w/ alt leg ext x 3 for 10 sec holds Supine TrA brace w/ alt leg ext then march x 10 each Seated abd set w/ chair in front x 10 w/ 5 sec hold Standing shld extension 20# x 10 Standing hip flexion w/ 1hand support x 10  8/15/2023Therapeutic Exercise: to improve strength and  mobility.  Demo, verbal and tactile cues throughout for technique. PPT 10 x 10 sec hold with cues. Hip IR/ER active stretch x 15    PATIENT EDUCATION:  Education details: findings, POC, initial HEP Person educated: Patient Education method: Explanation, Demonstration, Verbal cues, and Handouts Education comprehension: verbalized understanding and  returned demonstration   HOME EXERCISE PROGRAM: Access Code: [email or web address] URL: [email or web address] Date: 04/05/2022 Prepared by: Clarene Essex  Exercises - Supine Posterior Pelvic Tilt  - 1 x daily - 7 x weekly - 3 sets - 10 reps - Supine Hip Internal and External Rotation  - 1 x daily - 7 x weekly - 3 sets - 10 reps - Supine Transversus Abdominis Bracing with Leg Extension  - 1 x daily - 7 x weekly - 3 sets - 10 reps - 5 sec hold - Seated Abdominal Press into [employer]  - 1 x daily - 7 x weekly - 3 sets - 10 reps - 5 sec  hold - Standing Hip Flexion with Counter Support  - 1 x daily - 7 x weekly - 3 sets - 10 reps  ASSESSMENT:  CLINICAL IMPRESSION:  Pt demonstrated a good response to initial progression of exercises. Focused heavily on abdominal activation and strengthening, pt was unable to hold 90/90 position of hips bil w/o LBP. Cues required w/ sitting ab set for proper core activation. Updated HEP to include more abdominal strengthening and hip flexor strengthening.   OBJECTIVE IMPAIRMENTS decreased activity tolerance, decreased mobility, decreased strength, increased fascial restrictions, impaired perceived functional ability, increased muscle spasms, improper body mechanics, postural dysfunction, and pain.   ACTIVITY LIMITATIONS carrying, lifting, bending, sitting, standing, sleeping, transfers, and bed mobility  PARTICIPATION LIMITATIONS: cleaning, laundry, shopping, community activity, and yard work  PERSONAL FACTORS Age, Time since onset of injury/illness/exacerbation, and 3+ comorbidities: recent R TKA, chronic  LBP, abdominal surgeries, seizure disorder, memory impairments secondary to CVA  are also affecting patient's functional outcome.   REHAB POTENTIAL: Good  CLINICAL DECISION MAKING: Stable/uncomplicated  EVALUATION COMPLEXITY: Low   GOALS: Goals reviewed with patient? Yes  SHORT TERM GOALS: Target date: 04/17/2022   Patient will be independent with initial HEP.  Baseline: given Goal status: INITIAL   LONG TERM GOALS: Target date: 05/15/2022    Patient will be independent with advanced/ongoing HEP to improve outcomes and carryover.  Baseline: needs progression Goal status: INITIAL  2.  Patient will report 75% improvement in low back pain to improve QOL.  Baseline: 8-10/10 LBP Goal status: INITIAL  3.  Patient will report 75% improvement in sleep disruption due to low back pain.  Baseline: sleepless nightly 1-2 hours due to pain Goal status: INITIAL  4.  Patient will demonstrate full pain free lumbar ROM to perform ADLs.   Baseline: pain all directions Goal status: INITIAL  5.  Patient will demonstrate improved functional strength as demonstrated by no stomach bulging with bed mobility. Baseline: core weaknes Goal status: INITIAL  6.  Patient will report 15/50 or less on Oswestry to demonstrate improved functional ability.  Baseline: 20/50 = moderate disability  Goal status: INITIAL   7.  Patient will tolerate 45 min of sitting without increased LBP for traveling, eating meals, etc. Baseline: increased pain after 15 min sitting Goal status: INITIAL  8.  Patient will be able to transfer from floor to standing safely.  Baseline: unable Goal status: INITIAL    PLAN: PT FREQUENCY: 2x/week  PT DURATION: 6 weeks  PLANNED INTERVENTIONS: Therapeutic exercises, Therapeutic activity, Neuromuscular re-education, Balance training, Gait training, Patient/Family education, Self Care, Joint mobilization, Stair training, Dry Needling, Electrical stimulation, Spinal  mobilization, Moist heat, Taping, Traction, Ultrasound, Manual therapy, and Re-evaluation.  PLAN FOR NEXT SESSION: progress core strengthening exercises focusing on sitting/standing or supine.  Does not tolerate prone positioning.  Manual therapy and  modalities PRN.    Artist Pais, PTA 04/05/2022, 8:47 AM

## 2022-04-10 ENCOUNTER — Other Ambulatory Visit (HOSPITAL_BASED_OUTPATIENT_CLINIC_OR_DEPARTMENT_OTHER): Payer: Self-pay

## 2022-04-10 ENCOUNTER — Ambulatory Visit: Payer: Medicare HMO | Admitting: Physical Therapy

## 2022-04-10 ENCOUNTER — Encounter: Payer: Self-pay | Admitting: Physical Therapy

## 2022-04-10 DIAGNOSIS — M6281 Muscle weakness (generalized): Secondary | ICD-10-CM

## 2022-04-10 DIAGNOSIS — R252 Cramp and spasm: Secondary | ICD-10-CM

## 2022-04-10 DIAGNOSIS — G8929 Other chronic pain: Secondary | ICD-10-CM

## 2022-04-10 DIAGNOSIS — M25561 Pain in right knee: Secondary | ICD-10-CM | POA: Diagnosis not present

## 2022-04-10 NOTE — Therapy (Signed)
OUTPATIENT PHYSICAL THERAPY TREATMENT   Patient Name: Erik Salazar MRN: [id number] DOB:1950/12/05, 70 y.o., male Today's Date: 04/10/2022   PT End of Session - 04/10/22 0804     Visit Number 3    Number of Visits 12    Date for PT Re-Evaluation 05/15/22    Authorization Type [employer]    Progress Note Due on Visit 10    PT Start Time 0801    PT Stop Time 0847    PT Time Calculation (min) 46 min    Activity Tolerance Patient tolerated treatment well    Behavior During Therapy [facility] for tasks assessed/performed              Past Medical History:  Diagnosis Date   Arthritis    RA and OA   Carpal tunnel syndrome on right 07/08/2017   Cervical radiculopathy at C6 79/89/2119   Right   Complication of anesthesia    Coronary artery disease    Enlarged prostate    Fibromyalgia    H/O: knee surgery    15    History of blood clots    History of open heart surgery    ASCENDING AORTIC ANEURYSM   Ischemic stroke of frontal lobe ([place]) 03/14/2017   Bilateral; post-redo CT surgery   Pneumonia    PONV (postoperative nausea and vomiting)    only after CABG surgeries   Seizure disorder ([place]) 03/14/2017   Seizures ([place])    Status post knee surgery    DVT POST KNEE SURGERY   Stroke [facility])    Past Surgical History:  Procedure Laterality Date   ANTERIOR CERVICAL DECOMP/DISCECTOMY FUSION N/A 05/04/2020   Procedure: Anterior Cervical Decompression/Discectomy Fuion Cervical three-four, Cervical four-five, Cervical five-six;  Surgeon: Kary Kos, MD;  Location: [place];  Service: Neurosurgery;  Laterality: N/A;   BALLOON DILATION N/A 06/23/2019   Procedure: BALLOON DILATION;  Surgeon: Otis Brace, MD;  [facility];  Service: Gastroenterology;  Laterality: N/A;   BENTALL PROCEDURE  01/04/2016   Bentall with 23 mm pericardial AVR; SVG-LAD, SVG-CX ([place])   BICEPS TENDON REPAIR Right    BIOPSY  06/23/2019   Procedure: BIOPSY;  Surgeon:  Otis Brace, MD;  [facility];  Service: Gastroenterology;;   CARDIAC SURGERY     ANUERSYM MAY 2017   Bentall procedure. Bioprosthetic aortic valve #23 mm bovine model #2700 TF ask, and 28 mm Gelweave woven vascular sinus of Valsalva graft   CARPAL TUNNEL RELEASE  08/2018   right hand    CORONARY ARTERY BYPASS GRAFT  01/04/2016   VG to LAD & VG to LCX   CORONARY ARTERY BYPASS GRAFT  03/13/2017   LIMA to LAD with steril abcess removal from dacron graft   ESOPHAGOGASTRODUODENOSCOPY     Had done dilatation done about 2 or 3 times before in [place]   ESOPHAGOGASTRODUODENOSCOPY (EGD) WITH PROPOFOL N/A 06/23/2019   Procedure: ESOPHAGOGASTRODUODENOSCOPY (EGD) WITH PROPOFOL;  Surgeon: Otis Brace, MD;  [facility];  Service: Gastroenterology;  Laterality: N/A;   FALSE ANEURYSM REPAIR  03/13/2017   redo sternotomy, sterile abscess removal from Dacron graft, omental flap around aorta, CABG: LIMA-LAD ([name], Dr. Mart Piggs)   [place]  2018   Of pericardium 2018   INSERTION OF MESH  10/14/2020   Procedure: INSERTION OF MESH;  Surgeon: Michael Boston, MD;  Location: [place] [facility];  Service: General;;   knee surgeries     13 surgeries on knee done before knee  replacement    KNEE SURGERY  1983   LAPAROSCOPIC LYSIS OF ADHESIONS N/A 05/19/2021   Procedure: LYSIS OF ADHESIONS;  Surgeon: Michael Boston, MD;  Location: [place];  Service: General;  Laterality: N/A;  GEN & LOCAL   LYSIS OF ADHESION N/A 10/14/2020   Procedure: LYSIS OF ADHESION;  Surgeon: Michael Boston, MD;  Location: [place];  Service: General;  Laterality: N/A;   open heart surgery     03-13-2017   REPLACEMENT TOTAL KNEE Left 2015   ROTATOR CUFF REPAIR Right    done with biceps tendon repair   TONSILLECTOMY     removed as a child.   TOTAL KNEE ARTHROPLASTY Right 01/18/2022   Procedure: RIGHT TOTAL KNEE ARTHROPLASTY;  Surgeon: Leandrew Koyanagi, MD;  Location: [place];  Service: Orthopedics;  Laterality:  Right;   VENTRAL HERNIA REPAIR N/A 10/14/2020   Procedure: LAPAROSCOPIC VENTRAL HERNIA REPAIR WITH TAP BLOCK BILATERAL;  Surgeon: Michael Boston, MD;  Location: [place];  Service: General;  Laterality: N/A;   VENTRAL HERNIA REPAIR N/A 05/19/2021   Procedure: LAPAROSCOPIC VENTRAL WALL HERNIA REPAIR;  Surgeon: Michael Boston, MD;  Location: [place];  Service: General;  Laterality: N/A;   Patient Active Problem List   Diagnosis Date Noted   Postop check 03/26/2022   Gait abnormality 01/29/2022   Status post total right knee replacement 01/18/2022   Coronary artery disease involving native coronary artery of native heart without angina pectoris 01/04/2022   Preop cardiovascular exam 01/04/2022   Primary osteoarthritis of right knee 01/04/2022   Acute right-sided low back pain without sciatica 12/25/2021   Aortic atherosclerosis ([place]) 09/04/2021   Spondylolisthesis at L4-L5 level 06/28/2021   S/P repair of ventral hernia 05/19/2021   S/P repair of recurrent ventral hernia 05/19/2021   Benign prostatic hyperplasia without lower urinary tract symptoms 10/14/2020   Chronic pain 10/14/2020   ED (erectile dysfunction) of organic origin 10/14/2020   Esophageal dysphagia 10/14/2020   Gastroesophageal reflux disease 10/14/2020   History of aortic valve replacement with bioprosthetic valve 10/14/2020   Hx of aortic aneurysm repair 10/14/2020   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   Thoracic aortic aneurysm without rupture ([place]) 10/14/2020   Recurrent incisional hernia 10/14/2020   Spinal stenosis in cervical region 05/04/2020   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   Cervical radiculopathy at C6 11/14/2017   Carpal tunnel syndrome on right 07/08/2017   Tremor, essential 07/08/2017   Ischemic stroke of frontal lobe ([place]) 04/05/2017   Pain in right hand 04/05/2017   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 03/27/2017   Seizure ([place]) 03/14/2017   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/13/2017    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    PERTINENT HISTORY:  History chronic back pain, multiple back surgeries, bil  knee replacement (R knee on 01/18/2022), DVT, CVA frontal lobe with memory deficits, seizures, open heart surgery to repair aneurism, CABG x 2 , ACDF, hernia repair,omental flap graft to mediastinum  PAIN:  Are you having pain? Yes: NPRS scale: 8/10 Pain location: across low back  Pain description: shooting sharp pain, sometimes dull.  13/10 at night.  Aggravating factors: laying down, walking long distances, sitting >30 min  Relieving factors: changing positions constantly   PRECAUTIONS: None  WEIGHT BEARING RESTRICTIONS No  FALLS:  Has patient fallen in last 6 months? No  LIVING ENVIRONMENT: Lives with: lives with their spouse Lives in: House/apartment Stairs: No Has following equipment at home: Single point cane, Environmental consultant - 2 wheeled, and bed side commode  OCCUPATION: retired Curator  PLOF: Independent  walks daily 8k steps/day, also goes to gym  PATIENT GOALS decrease low back pain, sleep better.    OBJECTIVE:   DIAGNOSTIC FINDINGS:  Lumbar spine 06/28/2021 IMPRESSION: Fluoroscopic assistance was provided for surgical fusion at  L5-S1level.  PATIENT SURVEYS:  Modified Oswestry 20/50= 40% moderate disability    SCREENING FOR RED FLAGS: Bowel or bladder incontinence: No Spinal tumors: No Cauda equina syndrome: No Compression fracture: No Abdominal aneurysm: Yes: small, being monitored  COGNITION:  Overall cognitive status: History of cognitive impairments - at baseline for memory from CVA.      SENSATION: WFL  MUSCLE LENGTH: Hamstrings: Right 70 deg; Left 90 deg  POSTURE: decreased lumbar lordosis  PALPATION: Tenderness lumbar spine with PA mobs, L QL, L gluteus medius, R piriformis, R SIJ  LUMBAR ROM:   Active  AROM  eval  Flexion WNL **  Extension 75% limit **  Right lateral flexion To knee  Left lateral flexion To knee little pain  Right rotation WNL*  Left rotation WNL*   (Blank rows = not tested) *pain, **increased pain  LOWER EXTREMITY ROM:     Active  Right eval Left eval  Hip internal rotation 20 30  Hip external rotation 35 40  Knee flexion 120 130  Knee extension 5 0  Ankle dorsiflexion    Ankle plantarflexion     (Blank rows = not tested)  LOWER EXTREMITY MMT:  5/5 bil LE all myotomes.    LUMBAR SPECIAL TESTS:  Straight leg raise test: increased pain bilaterally, Slump test: Negative, and SI Compression/distraction test: Negative  FUNCTIONAL TESTS:  NT  GAIT: Distance walked: 48' Assistive device utilized: None Level of assistance: Complete Independence Comments: no significant deviation or device.  Walks 3-5,000 steps every morning.     TODAY'S TREATMENT  04/10/22 Therapeutic Exercise: to improve strength and mobility.  Bike L3 x 5 min  Bear position hold in quadruped 5 x 10 sec hold (knees on airex for extra cushion between sets) Child pose x 30 sec  Supine TrA brace with alternating march 3 x 10 bil Supine TrA brace with alternating arm extensions 2 x 10 bil Supine bridge with TrA brace and ball squeeze 3 x 10  Supine TrA brace with leg extensions 3 x 15  bil  Modalities: [place] 2 x 5 min 1MHz, 1.2 w/cm2 continuous to bil QL to decrease pain and spasm.    04/05/22 Therapeutic Exercise: Nustep L4x38mn Supine PPT x 10 - cues for proper movement LTR 5x10" each side Supine TrA brace w/ alt leg ext  x 3 for 10 sec holds Supine TrA brace w/ alt leg ext then march x 10 each Seated abd set w/ chair in front x 10 w/ 5 sec hold Standing shld extension 20# x 10 Standing hip flexion w/ 1hand support x 10  8/15/2023Therapeutic Exercise: to improve strength and mobility.  Demo, verbal and tactile cues throughout for technique. PPT 10 x 10 sec hold with cues. Hip IR/ER active stretch x 15    PATIENT EDUCATION:  Education details: findings, POC, initial HEP Person educated: Patient Education method: Explanation, Demonstration, Verbal cues, and Handouts Education comprehension: verbalized understanding and returned demonstration   HOME EXERCISE PROGRAM: Access Code: [email or web address]  ASSESSMENT:  CLINICAL IMPRESSION:  Continued to focus on abdominal bracing and activation with exercises today.  Trialed exercises in quadruped but had difficulty tolerating.  Followed by [place] to bil QL in sidelying to decrease muscle spasm, patient reported decreased tightness following and tolerated well.  Discussed trying compression shorts at night to see if helped when rolling over in bed.    Yvette Dinning continues to demonstrate potential for improvement and would benefit from continued skilled therapy to address impairments.     OBJECTIVE IMPAIRMENTS decreased activity tolerance, decreased mobility, decreased strength, increased fascial restrictions, impaired perceived functional ability, increased muscle spasms, improper body mechanics, postural dysfunction, and pain.   ACTIVITY LIMITATIONS carrying, lifting, bending, sitting, standing, sleeping, transfers, and bed mobility  PARTICIPATION LIMITATIONS: cleaning, laundry, shopping, community activity, and yard  work  PERSONAL FACTORS Age, Time since onset of injury/illness/exacerbation, and 3+ comorbidities: recent R TKA, chronic LBP, abdominal surgeries, seizure disorder, memory impairments secondary to CVA  are also affecting patient's functional outcome.   REHAB POTENTIAL: Good  CLINICAL DECISION MAKING: Stable/uncomplicated  EVALUATION COMPLEXITY: Low   GOALS: Goals reviewed with patient? Yes  SHORT TERM GOALS: Target date: 04/17/2022   Patient will be independent with initial HEP.  Baseline: given Goal status: IN PROGRESS   LONG TERM GOALS: Target date: 05/15/2022    Patient will be independent with advanced/ongoing HEP to improve outcomes and carryover.  Baseline: needs progression Goal status: IN PROGRESS  2.  Patient will report 75% improvement in low back pain to improve QOL.  Baseline: 8-10/10 LBP Goal status: IN PROGRESS  3.  Patient will report 75% improvement in sleep disruption due to low back pain.  Baseline: sleepless nightly 1-2 hours due to pain Goal status: IN PROGRESS  4.  Patient will demonstrate full pain free lumbar ROM to perform ADLs.   Baseline: pain all directions Goal status: IN PROGRESS  5.  Patient will demonstrate improved functional strength as demonstrated by no stomach bulging with bed mobility. Baseline: core weaknes Goal status: IN PROGRESS  6.  Patient will report 15/50 or less on Oswestry to demonstrate improved functional ability.  Baseline: 20/50 = moderate disability  Goal status: IN PROGRESS   7.  Patient will tolerate 45 min of sitting without increased LBP for traveling, eating meals, etc. Baseline: increased pain after 15 min sitting Goal status: IN PROGRESS  8.  Patient will be able to transfer from floor to standing safely.  Baseline: unable Goal status: IN PROGRESS    PLAN: PT FREQUENCY: 2x/week  PT DURATION: 6 weeks  PLANNED INTERVENTIONS: Therapeutic exercises, Therapeutic activity, Neuromuscular re-education,  Balance training, Gait training, Patient/Family education, Self Care, Joint mobilization, Stair training, Dry Needling, Electrical stimulation, Spinal mobilization, Moist heat, Taping, Traction, Ultrasound, Manual therapy, and Re-evaluation.  PLAN FOR NEXT SESSION: progress core strengthening exercises  focusing on sitting/standing or supine.  Add lunges to strength floor to standing transfers.  Does not tolerate prone positioning.  Manual therapy and modalities PRN.    Rennie Natter, PT, DPT  04/10/2022, 8:55 AM

## 2022-04-11 ENCOUNTER — Other Ambulatory Visit (HOSPITAL_BASED_OUTPATIENT_CLINIC_OR_DEPARTMENT_OTHER): Payer: Self-pay

## 2022-04-12 ENCOUNTER — Ambulatory Visit (INDEPENDENT_AMBULATORY_CARE_PROVIDER_SITE_OTHER): Payer: Medicare HMO | Admitting: Orthopaedic Surgery

## 2022-04-12 ENCOUNTER — Other Ambulatory Visit (HOSPITAL_BASED_OUTPATIENT_CLINIC_OR_DEPARTMENT_OTHER): Payer: Self-pay

## 2022-04-12 DIAGNOSIS — Z96651 Presence of right artificial knee joint: Secondary | ICD-10-CM

## 2022-04-12 NOTE — Progress Notes (Signed)
Post-Op Visit Note   Patient: Erik Salazar           Date of Birth: [birth year]           MRN: [id number] Visit Date: 04/12/2022 PCP: Jerline Pain, MD   Assessment & Plan:  Chief Complaint:  Chief Complaint  Patient presents with   Right Knee - Routine Post Op   Visit Diagnoses:  1. History of total knee replacement, right     Plan: [name] is 70-month[age] post right total knee on 01/18/2022.  Everything is going well.  He finished physical therapy.  He is very happy overall.  Examination right knee shows full healed surgical scar.  Expected postoperative changes.  Range of motion is excellent.  Stable to varus valgus stress.  Normal gait.  [name] done very well from the surgery.  Dental prophylaxis reinforced.  We will recheck him in 3 months with two-view x-rays of the right knee.  Follow-Up Instructions: Return in about 3 months (around 07/13/2022).   Orders:  No orders of the defined types were placed in this encounter.  No orders of the defined types were placed in this encounter.   Imaging: No results found.  PMFS History: Patient Active Problem List   Diagnosis Date Noted   Postop check 03/26/2022   Gait abnormality 01/29/2022   Status post total right knee replacement 01/18/2022   Coronary artery disease involving native coronary artery of native heart without angina pectoris 01/04/2022   Preop cardiovascular exam 01/04/2022   Primary osteoarthritis of right knee 01/04/2022   Acute right-sided low back pain without sciatica 12/25/2021   Aortic atherosclerosis ([place] 09/04/2021   Spondylolisthesis at L4-L5 level 06/28/2021   S/P repair of ventral hernia 05/19/2021   S/P repair of recurrent ventral hernia 05/19/2021   Benign prostatic hyperplasia without lower urinary tract symptoms 10/14/2020   Chronic pain 10/14/2020   ED (erectile dysfunction) of organic origin 10/14/2020   Esophageal dysphagia 10/14/2020   Gastroesophageal reflux disease 10/14/2020    History of aortic valve replacement with bioprosthetic valve 10/14/2020   Hx of aortic aneurysm repair 10/14/2020   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 10/[place]   Thoracic aortic aneurysm without rupture ([place] 10/14/2020   Recurrent incisional hernia 10/14/2020   Spinal stenosis in cervical region 05/04/2020   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   Cervical radiculopathy at C6 11/14/2017   Carpal tunnel syndrome on right 07/08/2017   Tremor, essential 07/08/2017   Ischemic stroke of frontal lobe ([place] 04/05/2017   Pain in right hand 04/05/2017   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 03/27/2017   [place] 03/14/2017   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/13/2017   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[place] 03/12/2017   Essential hypertension 02/26/2017   Mixed hyperlipidemia 02/26/2017   Past Medical History:  Diagnosis Date   Arthritis    RA and OA   Carpal tunnel syndrome on right 07/08/2017   Cervical radiculopathy at C6 071/01/2693  Right   Complication of anesthesia    Coronary artery disease    Enlarged prostate    Fibromyalgia    H/O: knee surgery    15    History of blood clots    History of open heart surgery    ASCENDING AORTIC ANEURYSM   [place] 03/14/2017   Bilateral; post-redo CT surgery   Pneumonia    PONV (postoperative nausea and vomiting)    only after CABG surgeries   Seizure disorder (  [place]) 03/14/2017   Seizures ([place])    Status post knee surgery    DVT POST KNEE SURGERY   Stroke [facility])     Family History  Problem Relation Age of Onset   Arthritis Mother    Aneurysm Mother        brain aneurysm for mother.    Arthritis Father    Colon cancer Neg Hx    Esophageal cancer Neg Hx     Past Surgical History:  Procedure Laterality Date   ANTERIOR CERVICAL DECOMP/DISCECTOMY FUSION N/A 05/04/2020   Procedure: Anterior Cervical Decompression/Discectomy Fuion Cervical three-four, Cervical four-five, Cervical five-six;  Surgeon:  Kary Kos, MD;  Location: [place];  Service: Neurosurgery;  Laterality: N/A;   BALLOON DILATION N/A 06/23/2019   Procedure: BALLOON DILATION;  Surgeon: Otis Brace, MD;  [facility];  Service: Gastroenterology;  Laterality: N/A;   BENTALL PROCEDURE  01/04/2016   Bentall with 23 mm pericardial AVR; SVG-LAD, SVG-CX ([place])   BICEPS TENDON REPAIR Right    BIOPSY  06/23/2019   Procedure: BIOPSY;  Surgeon: Otis Brace, MD;  [facility];  Service: Gastroenterology;;   CARDIAC SURGERY     ANUERSYM MAY 2017   Bentall procedure. Bioprosthetic aortic valve #23 mm bovine model #2700 TF ask, and 28 mm Gelweave woven vascular sinus of Valsalva graft   CARPAL TUNNEL RELEASE  08/2018   right hand    CORONARY ARTERY BYPASS GRAFT  01/04/2016   VG to LAD & VG to LCX   CORONARY ARTERY BYPASS GRAFT  03/13/2017   LIMA to LAD with steril abcess removal from dacron graft   ESOPHAGOGASTRODUODENOSCOPY     Had done dilatation done about 2 or 3 times before in [place]   ESOPHAGOGASTRODUODENOSCOPY (EGD) WITH PROPOFOL N/A 06/23/2019   Procedure: ESOPHAGOGASTRODUODENOSCOPY (EGD) WITH PROPOFOL;  Surgeon: Otis Brace, MD;  [facility];  Service: Gastroenterology;  Laterality: N/A;   FALSE ANEURYSM REPAIR  03/13/2017   redo sternotomy, sterile abscess removal from Dacron graft, omental flap around aorta, CABG: LIMA-LAD ([name], Dr. Mart Piggs)   [place]  2018   Of pericardium 2018   INSERTION OF MESH  10/14/2020   Procedure: INSERTION OF MESH;  Surgeon: Michael Boston, MD;  Location: [place];  Service: General;;   knee surgeries     13 surgeries on knee done before knee replacement    [place] N/A 05/19/2021   Procedure: LYSIS OF ADHESIONS;  Surgeon: Michael Boston, MD;  Location: [place];  Service: General;  Laterality: N/A;  GEN & LOCAL   LYSIS OF ADHESION N/A 10/14/2020   Procedure:  LYSIS OF ADHESION;  Surgeon: Michael Boston, MD;  Location: [place];  Service: General;  Laterality: N/A;   open heart surgery     03-13-2017   REPLACEMENT TOTAL KNEE Left 2015   ROTATOR CUFF REPAIR Right    done with biceps tendon repair   TONSILLECTOMY     removed as a child.   TOTAL KNEE ARTHROPLASTY Right 01/18/2022   Procedure: RIGHT TOTAL KNEE ARTHROPLASTY;  Surgeon: Leandrew Koyanagi, MD;  Location: [place];  Service: Orthopedics;  Laterality: Right;   VENTRAL HERNIA REPAIR N/A 10/14/2020   Procedure: LAPAROSCOPIC VENTRAL HERNIA REPAIR WITH TAP BLOCK BILATERAL;  Surgeon: Michael Boston, MD;  Location: [place];  Service: General;  Laterality: N/A;   VENTRAL HERNIA REPAIR N/A 05/19/2021   Procedure: LAPAROSCOPIC VENTRAL WALL HERNIA REPAIR;  Surgeon: Michael Boston, MD;  Location: [place];  Service: General;  Laterality: N/A;   Social History   Occupational History   Occupation: RETIRED  Tobacco Use   Smoking status: Former    Types: Cigars    Quit date: 2021    Years since quitting: 2.6   Smokeless tobacco: Never   Tobacco comments:    Only smoked cigars for 6 months  Vaping Use   Vaping Use: Never used  Substance and Sexual Activity   Alcohol use: Yes    Alcohol/week: 1.0 standard drink of alcohol    Types: 1 Cans of beer per week    Comment: occassionally   Drug use: No   Sexual activity: Not on file

## 2022-04-13 ENCOUNTER — Ambulatory Visit: Payer: Medicare HMO

## 2022-04-13 DIAGNOSIS — M5442 Lumbago with sciatica, left side: Secondary | ICD-10-CM | POA: Diagnosis not present

## 2022-04-13 DIAGNOSIS — M6281 Muscle weakness (generalized): Secondary | ICD-10-CM

## 2022-04-13 DIAGNOSIS — R252 Cramp and spasm: Secondary | ICD-10-CM

## 2022-04-13 DIAGNOSIS — G8929 Other chronic pain: Secondary | ICD-10-CM

## 2022-04-13 NOTE — Therapy (Signed)
OUTPATIENT PHYSICAL THERAPY TREATMENT   Patient Name: Erik Salazar MRN: [id number] DOB:[birth year], 71 y.o., male Today's Date: 04/13/2022   PT End of Session - 04/13/22 0848     Visit Number 4    Number of Visits 12    Date for PT Re-Evaluation 05/15/22    Authorization Type [employer]    Progress Note Due on Visit 10    PT Start Time 0801    PT Stop Time 0856    PT Time Calculation (min) 55 min    Activity Tolerance Patient tolerated treatment well    Behavior During Therapy [facility] for tasks assessed/performed               Past Medical History:  Diagnosis Date   Arthritis    RA and OA   Carpal tunnel syndrome on right 07/08/2017   Cervical radiculopathy at C6 80/99/8338   Right   Complication of anesthesia    Coronary artery disease    Enlarged prostate    Fibromyalgia    H/O: knee surgery    15    History of blood clots    History of open heart surgery    ASCENDING AORTIC ANEURYSM   Ischemic stroke of frontal lobe ([place]) 03/14/2017   Bilateral; post-redo CT surgery   Pneumonia    PONV (postoperative nausea and vomiting)    only after CABG surgeries   Seizure disorder ([place]) 03/14/2017   Seizures ([place])    Status post knee surgery    DVT POST KNEE SURGERY   Stroke [facility])    Past Surgical History:  Procedure Laterality Date   ANTERIOR CERVICAL DECOMP/DISCECTOMY FUSION N/A 05/04/2020   Procedure: Anterior Cervical Decompression/Discectomy Fuion Cervical three-four, Cervical four-five, Cervical five-six;  Surgeon: Kary Kos, MD;  Location: [place];  Service: Neurosurgery;  Laterality: N/A;   BALLOON DILATION N/A 06/23/2019   Procedure: BALLOON DILATION;  Surgeon: Otis Brace, MD;  Location: [facility];  Service: Gastroenterology;  Laterality: N/A;   BENTALL PROCEDURE  01/04/2016   Bentall with 23 mm pericardial AVR; SVG-LAD, SVG-CX ([place])   BICEPS TENDON REPAIR Right    BIOPSY  06/23/2019   Procedure: BIOPSY;  Surgeon:  Otis Brace, MD;  Location: [facility];  Service: Gastroenterology;;   CARDIAC SURGERY     ANUERSYM MAY 2017   Bentall procedure. Bioprosthetic aortic valve #23 mm bovine model #2700 TF ask, and 28 mm Gelweave woven vascular sinus of Valsalva graft   CARPAL TUNNEL RELEASE  08/2018   right hand    CORONARY ARTERY BYPASS GRAFT  01/04/2016   VG to LAD & VG to LCX   CORONARY ARTERY BYPASS GRAFT  03/13/2017   LIMA to LAD with steril abcess removal from dacron graft   ESOPHAGOGASTRODUODENOSCOPY     Had done dilatation done about 2 or 3 times before in [place]   ESOPHAGOGASTRODUODENOSCOPY (EGD) WITH PROPOFOL N/A 06/23/2019   Procedure: ESOPHAGOGASTRODUODENOSCOPY (EGD) WITH PROPOFOL;  Surgeon: Otis Brace, MD;  [facility];  Service: Gastroenterology;  Laterality: N/A;   FALSE ANEURYSM REPAIR  03/13/2017   redo sternotomy, sterile abscess removal from Dacron graft, omental flap around aorta, CABG: LIMA-LAD ([name], Dr. Mart Piggs)   [place]  2018   Of pericardium 2018   INSERTION OF MESH  10/14/2020   Procedure: INSERTION OF MESH;  Surgeon: Michael Boston, MD;  Location: [place];  Service: General;;   knee surgeries     13 surgeries on knee done before  knee replacement    KNEE SURGERY  1983   LAPAROSCOPIC LYSIS OF ADHESIONS N/A 05/19/2021   Procedure: LYSIS OF ADHESIONS;  Surgeon: Michael Boston, MD;  Location: [place];  Service: General;  Laterality: N/A;  GEN & LOCAL   LYSIS OF ADHESION N/A 10/14/2020   Procedure: LYSIS OF ADHESION;  Surgeon: Michael Boston, MD;  Location: [place];  Service: General;  Laterality: N/A;   open heart surgery     03-13-2017   REPLACEMENT TOTAL KNEE Left 2015   ROTATOR CUFF REPAIR Right    done with biceps tendon repair   TONSILLECTOMY     removed as a child.   TOTAL KNEE ARTHROPLASTY Right 01/18/2022   Procedure: RIGHT TOTAL KNEE ARTHROPLASTY;  Surgeon: Leandrew Koyanagi, MD;  Location: [place];  Service: Orthopedics;  Laterality:  Right;   VENTRAL HERNIA REPAIR N/A 10/14/2020   Procedure: LAPAROSCOPIC VENTRAL HERNIA REPAIR WITH TAP BLOCK BILATERAL;  Surgeon: Michael Boston, MD;  Location: [place];  Service: General;  Laterality: N/A;   VENTRAL HERNIA REPAIR N/A 05/19/2021   Procedure: LAPAROSCOPIC VENTRAL WALL HERNIA REPAIR;  Surgeon: Michael Boston, MD;  Location: [place];  Service: General;  Laterality: N/A;   Patient Active Problem List   Diagnosis Date Noted   Postop check 03/26/2022   Gait abnormality 01/29/2022   Status post total right knee replacement 01/18/2022   Coronary artery disease involving native coronary artery of native heart without angina pectoris 01/04/2022   Preop cardiovascular exam 01/04/2022   Primary osteoarthritis of right knee 01/04/2022   Acute right-sided low back pain without sciatica 12/25/2021   Aortic atherosclerosis ([place]) 09/04/2021   Spondylolisthesis at L4-L5 level 06/28/2021   S/P repair of ventral hernia 05/19/2021   S/P repair of recurrent ventral hernia 05/19/2021   Benign prostatic hyperplasia without lower urinary tract symptoms 10/14/2020   Chronic pain 10/14/2020   ED (erectile dysfunction) of organic origin 10/14/2020   Esophageal dysphagia 10/14/2020   Gastroesophageal reflux disease 10/14/2020   History of aortic valve replacement with bioprosthetic valve 10/14/2020   Hx of aortic aneurysm repair 10/14/2020   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   Thoracic aortic aneurysm without rupture ([place]) 10/14/2020   Recurrent incisional hernia 10/14/2020   Spinal stenosis in cervical region 05/04/2020   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   Cervical radiculopathy at C6 11/14/2017   Carpal tunnel syndrome on right 07/08/2017   Tremor, essential 07/08/2017   Ischemic stroke of frontal lobe ([place]) 04/05/2017   Pain in right hand 04/05/2017   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 03/27/2017   Seizure ([place]) 03/14/2017   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/13/2017    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  PERTINENT HISTORY:  History chronic back pain, multiple back surgeries, bil  knee replacement (R knee on 01/18/2022), DVT, CVA frontal lobe with memory deficits, seizures, open heart surgery to repair aneurism, CABG x 2 , ACDF, hernia repair,omental flap graft to mediastinum  PAIN:  Are you having pain? Yes: NPRS scale: 9/10 Pain location: across low back  Pain description: shooting sharp pain, sometimes dull.  13/10 at night.  Aggravating factors: laying down, walking long distances, sitting >30 min  Relieving factors: changing positions constantly   PRECAUTIONS: None  WEIGHT BEARING RESTRICTIONS No  FALLS:  Has patient fallen in last 6 months? No  LIVING ENVIRONMENT: Lives with: lives with their spouse Lives in: House/apartment Stairs: No Has following equipment at home: Single point cane, Environmental consultant - 2 wheeled, and bed side commode  OCCUPATION: retired Curator  PLOF: Independent  walks daily 8k steps/day, also goes to gym  PATIENT GOALS decrease low back pain, sleep better.    OBJECTIVE:   DIAGNOSTIC FINDINGS:  Lumbar spine 06/28/2021 IMPRESSION: Fluoroscopic assistance was provided for surgical  fusion at L5-S1level.  PATIENT SURVEYS:  Modified Oswestry 20/50= 40% moderate disability    SCREENING FOR RED FLAGS: Bowel or bladder incontinence: No Spinal tumors: No Cauda equina syndrome: No Compression fracture: No Abdominal aneurysm: Yes: small, being monitored  COGNITION:  Overall cognitive status: History of cognitive impairments - at baseline for memory from CVA.      SENSATION: WFL  MUSCLE LENGTH: Hamstrings: Right 70 deg; Left 90 deg  POSTURE: decreased lumbar lordosis  PALPATION: Tenderness lumbar spine with PA mobs, L QL, L gluteus medius, R piriformis, R SIJ  LUMBAR ROM:   Active  AROM  eval  Flexion WNL **  Extension 75% limit **  Right lateral flexion To knee  Left lateral flexion To knee little pain  Right rotation WNL*  Left rotation WNL*   (Blank rows = not tested) *pain, **increased pain  LOWER EXTREMITY ROM:     Active  Right eval Left eval  Hip internal rotation 20 30  Hip external rotation 35 40  Knee flexion 120 130  Knee extension 5 0  Ankle dorsiflexion    Ankle plantarflexion     (Blank rows = not tested)  LOWER EXTREMITY MMT:  5/5 bil LE all myotomes.    LUMBAR SPECIAL TESTS:  Straight leg raise test: increased pain bilaterally, Slump test: Negative, and SI Compression/distraction test: Negative  FUNCTIONAL TESTS:  NT  GAIT: Distance walked: 41' Assistive device utilized: None Level of assistance: Complete Independence Comments: no significant deviation or device.  Walks 3-5,000 steps every morning.     TODAY'S TREATMENT  04/13/22 Bike L4x33mn Supine TrA brace with alt march (bil con/unilat ecc) 10x Supine TrA brace with isometric hold at 90/90 position 3x10" SLR in supine bil 2# weight x 10 Seated pallof press with green TB narrow BOS, no back support x 15 bil BATCA row 25# 2x15   Manual Therapy: STM to Bil lumbar PS R S/L  Moist Heat to lumbar spine x 10 min  04/10/22 Therapeutic Exercise: to improve  strength and mobility.  Bike L3 x 5 min  Bear position hold in quadruped 5 x 10 sec hold (knees on airex for extra cushion between sets) Child pose x 30 sec  Supine TrA brace with alternating march 3 x 10 bil Supine TrA brace with alternating  arm extensions 2 x 10 bil Supine bridge with TrA brace and ball squeeze 3 x 10  Supine TrA brace with leg extensions 3 x 15 bil  Modalities: [place] 2 x 5 min 1MHz, 1.2 w/cm2 continuous to bil QL to decrease pain and spasm.    04/05/22 Therapeutic Exercise: Nustep L4x16mn Supine PPT x 10 - cues for proper movement LTR 5x10" each side Supine TrA brace w/ alt leg ext x 3 for 10 sec holds Supine TrA brace w/ alt leg ext then march x 10 each Seated abd set w/ chair in front x 10 w/ 5 sec hold Standing shld extension 20# x 10 Standing hip flexion w/ 1hand support x 10  8/15/2023Therapeutic Exercise: to improve strength and mobility.  Demo, verbal and tactile cues throughout for technique. PPT 10 x 10 sec hold with cues. Hip IR/ER active stretch x 15    PATIENT EDUCATION:  Education details: findings, POC, initial HEP Person educated: Patient Education method: Explanation, Demonstration, Verbal cues, and Handouts Education comprehension: verbalized understanding and returned demonstration   HOME EXERCISE PROGRAM: Access Code: [email or web address] ASSESSMENT:  CLINICAL IMPRESSION:  Pt noted only short term relief from [place] session. Advanced through TE progressing core strength and introducing postural exercises for proper spinal alignment. Pt didn't report much improvement in LBP from exercises. Shifted focus on STM to decrease pain and tightness along lower back. Pt reported improvement in LBP after MT. Finished session with moist heat to low back to increase tissue extensibility and decrease pain.   OBJECTIVE IMPAIRMENTS decreased activity tolerance, decreased mobility, decreased strength, increased fascial restrictions, impaired perceived functional  ability, increased muscle spasms, improper body mechanics, postural dysfunction, and pain.   ACTIVITY LIMITATIONS carrying, lifting, bending, sitting, standing, sleeping, transfers, and bed mobility  PARTICIPATION LIMITATIONS: cleaning, laundry, shopping, community activity, and yard work  PERSONAL FACTORS Age, Time since onset of injury/illness/exacerbation, and 3+ comorbidities: recent R TKA, chronic LBP, abdominal surgeries, seizure disorder, memory impairments secondary to CVA  are also affecting patient's functional outcome.   REHAB POTENTIAL: Good  CLINICAL DECISION MAKING: Stable/uncomplicated  EVALUATION COMPLEXITY: Low   GOALS: Goals reviewed with patient? Yes  SHORT TERM GOALS: Target date: 04/17/2022   Patient will be independent with initial HEP.  Baseline: given Goal status: IN PROGRESS   LONG TERM GOALS: Target date: 05/15/2022    Patient will be independent with advanced/ongoing HEP to improve outcomes and carryover.  Baseline: needs progression Goal status: IN PROGRESS  2.  Patient will report 75% improvement in low back pain to improve QOL.  Baseline: 8-10/10 LBP Goal status: IN PROGRESS  3.  Patient will report 75% improvement in sleep disruption due to low back pain.  Baseline: sleepless nightly 1-2 hours due to pain Goal status: IN PROGRESS  4.  Patient will demonstrate full pain free lumbar ROM to perform ADLs.   Baseline: pain all directions Goal status: IN PROGRESS  5.  Patient will demonstrate improved functional strength as demonstrated by no stomach bulging with bed mobility. Baseline: core weaknes Goal status: IN PROGRESS  6.  Patient will report 15/50 or less on Oswestry to demonstrate improved functional ability.  Baseline: 20/50 = moderate disability  Goal status: IN PROGRESS   7.  Patient will tolerate 45 min of sitting without increased LBP for traveling, eating meals, etc. Baseline: increased pain after 15 min sitting Goal status: IN  PROGRESS  8.  Patient will be able to transfer from floor to standing safely.  Baseline:  unable Goal status: IN PROGRESS    PLAN: PT FREQUENCY: 2x/week  PT DURATION: 6 weeks  PLANNED INTERVENTIONS: Therapeutic exercises, Therapeutic activity, Neuromuscular re-education, Balance training, Gait training, Patient/Family education, Self Care, Joint mobilization, Stair training, Dry Needling, Electrical stimulation, Spinal mobilization, Moist heat, Taping, Traction, Ultrasound, Manual therapy, and Re-evaluation.  PLAN FOR NEXT SESSION: progress core strengthening exercises focusing on sitting/standing or supine.  Add lunges to strength floor to standing transfers.  Does not tolerate prone positioning.  Manual therapy and modalities PRN.    Artist Pais, PTA 04/13/2022, 9:15 AM

## 2022-04-17 ENCOUNTER — Ambulatory Visit: Payer: Medicare HMO

## 2022-04-17 DIAGNOSIS — M6281 Muscle weakness (generalized): Secondary | ICD-10-CM

## 2022-04-17 DIAGNOSIS — G8929 Other chronic pain: Secondary | ICD-10-CM

## 2022-04-17 DIAGNOSIS — M5442 Lumbago with sciatica, left side: Secondary | ICD-10-CM | POA: Diagnosis not present

## 2022-04-17 DIAGNOSIS — R252 Cramp and spasm: Secondary | ICD-10-CM

## 2022-04-17 NOTE — Therapy (Signed)
OUTPATIENT PHYSICAL THERAPY TREATMENT   Patient Name: Erik Salazar MRN: [id number] DOB:07-Nov-1950, 71 y.o., male Today's Date: 04/17/2022   PT End of Session - 04/17/22 0849     Visit Number 5    Number of Visits 12    Date for PT Re-Evaluation 05/15/22    Authorization Type [employer]    Progress Note Due on Visit 10    PT Start Time 0801    PT Stop Time 0856    PT Time Calculation (min) 55 min    Activity Tolerance Patient tolerated treatment well    Behavior During Therapy [facility] for tasks assessed/performed                Past Medical History:  Diagnosis Date   Arthritis    RA and OA   Carpal tunnel syndrome on right 07/08/2017   Cervical radiculopathy at C6 82/50/0370   Right   Complication of anesthesia    Coronary artery disease    Enlarged prostate    Fibromyalgia    H/O: knee surgery    15    History of blood clots    History of open heart surgery    ASCENDING AORTIC ANEURYSM   Ischemic stroke of frontal lobe ([place]) 03/14/2017   Bilateral; post-redo CT surgery   Pneumonia    PONV (postoperative nausea and vomiting)    only after CABG surgeries   Seizure disorder ([place]) 03/14/2017   Seizures ([place])    Status post knee surgery    DVT POST KNEE SURGERY   Stroke [facility])    Past Surgical History:  Procedure Laterality Date   ANTERIOR CERVICAL DECOMP/DISCECTOMY FUSION N/A 05/04/2020   Procedure: Anterior Cervical Decompression/Discectomy Fuion Cervical three-four, Cervical four-five, Cervical five-six;  Surgeon: Kary Kos, MD;  Location: [place];  Service: Neurosurgery;  Laterality: N/A;   BALLOON DILATION N/A 06/23/2019   Procedure: BALLOON DILATION;  Surgeon: Otis Brace, MD;  Location: [facility];  Service: Gastroenterology;  Laterality: N/A;   BENTALL PROCEDURE  01/04/2016   Bentall with 23 mm pericardial AVR; SVG-LAD, SVG-CX ([place])   BICEPS TENDON REPAIR Right    BIOPSY  06/23/2019   Procedure: BIOPSY;  Surgeon:  Otis Brace, MD;  Location: [facility];  Service: Gastroenterology;;   CARDIAC SURGERY     ANUERSYM MAY 2017   Bentall procedure. Bioprosthetic aortic valve #23 mm bovine model #2700 TF ask, and 28 mm Gelweave woven vascular sinus of Valsalva graft   CARPAL TUNNEL RELEASE  08/2018   right hand    CORONARY ARTERY BYPASS GRAFT  01/04/2016   VG to LAD & VG to LCX   CORONARY ARTERY BYPASS GRAFT  03/13/2017   LIMA to LAD with steril abcess removal from dacron graft   ESOPHAGOGASTRODUODENOSCOPY     Had done dilatation done about 2 or 3 times before in [place]   ESOPHAGOGASTRODUODENOSCOPY (EGD) WITH PROPOFOL N/A 06/23/2019   Procedure: ESOPHAGOGASTRODUODENOSCOPY (EGD) WITH PROPOFOL;  Surgeon: Otis Brace, MD;  [facility];  Service: Gastroenterology;  Laterality: N/A;   FALSE ANEURYSM REPAIR  03/13/2017   redo sternotomy, sterile abscess removal from Dacron graft, omental flap around aorta, CABG: LIMA-LAD ([name], Dr. Mart Piggs)   [place]  2018   Of pericardium 2018   INSERTION OF MESH  10/14/2020   Procedure: INSERTION OF MESH;  Surgeon: Michael Boston, MD;  Location: [place] [facility];  Service: General;;   knee surgeries     13 surgeries on knee done  before knee replacement    KNEE SURGERY  1983   LAPAROSCOPIC LYSIS OF ADHESIONS N/A 05/19/2021   Procedure: LYSIS OF ADHESIONS;  Surgeon: Michael Boston, MD;  Location: [place];  Service: General;  Laterality: N/A;  GEN & LOCAL   LYSIS OF ADHESION N/A 10/14/2020   Procedure: LYSIS OF ADHESION;  Surgeon: Michael Boston, MD;  Location: [place];  Service: General;  Laterality: N/A;   open heart surgery     03-13-2017   REPLACEMENT TOTAL KNEE Left 2015   ROTATOR CUFF REPAIR Right    done with biceps tendon repair   TONSILLECTOMY     removed as a child.   TOTAL KNEE ARTHROPLASTY Right 01/18/2022   Procedure: RIGHT TOTAL KNEE ARTHROPLASTY;  Surgeon: Leandrew Koyanagi, MD;  Location: [place];  Service: Orthopedics;  Laterality:  Right;   VENTRAL HERNIA REPAIR N/A 10/14/2020   Procedure: LAPAROSCOPIC VENTRAL HERNIA REPAIR WITH TAP BLOCK BILATERAL;  Surgeon: Michael Boston, MD;  Location: [place];  Service: General;  Laterality: N/A;   VENTRAL HERNIA REPAIR N/A 05/19/2021   Procedure: LAPAROSCOPIC VENTRAL WALL HERNIA REPAIR;  Surgeon: Michael Boston, MD;  Location: [place];  Service: General;  Laterality: N/A;   Patient Active Problem List   Diagnosis Date Noted   Postop check 03/26/2022   Gait abnormality 01/29/2022   Status post total right knee replacement 01/18/2022   Coronary artery disease involving native coronary artery of native heart without angina pectoris 01/04/2022   Preop cardiovascular exam 01/04/2022   Primary osteoarthritis of right knee 01/04/2022   Acute right-sided low back pain without sciatica 12/25/2021   Aortic atherosclerosis ([place]) 09/04/2021   Spondylolisthesis at L4-L5 level 06/28/2021   S/P repair of ventral hernia 05/19/2021   S/P repair of recurrent ventral hernia 05/19/2021   Benign prostatic hyperplasia without lower urinary tract symptoms 10/14/2020   Chronic pain 10/14/2020   ED (erectile dysfunction) of organic origin 10/14/2020   Esophageal dysphagia 10/14/2020   Gastroesophageal reflux disease 10/14/2020   History of aortic valve replacement with bioprosthetic valve 10/14/2020   Hx of aortic aneurysm repair 10/14/2020   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   PERTINENT HISTORY:  History chronic back pain, multiple back surgeries, bil  knee replacement (R knee on 01/18/2022), DVT, CVA frontal lobe with memory deficits, seizures, open heart surgery to repair aneurism, CABG x 2 , ACDF, hernia repair,omental flap graft to mediastinum  PAIN:  Are you having pain? Yes: NPRS scale: 7-8/10 Pain location: across low back  Pain description: shooting sharp pain, sometimes dull.  13/10 at night.  Aggravating factors: laying down, walking long distances, sitting >30 min  Relieving factors: changing positions constantly   PRECAUTIONS: None  WEIGHT BEARING RESTRICTIONS No  FALLS:  Has patient fallen in last 6 months? No  LIVING ENVIRONMENT: Lives with: lives with their spouse Lives in: House/apartment Stairs: No Has following equipment at home: Single point cane, Environmental consultant - 2 wheeled, and bed side commode  OCCUPATION: retired Curator  PLOF: Independent  walks daily 8k steps/day, also goes to gym  PATIENT GOALS decrease low back pain, sleep better.    OBJECTIVE:   DIAGNOSTIC FINDINGS:  Lumbar spine 06/28/2021 IMPRESSION: Fluoroscopic assistance was provided for surgical  fusion at L5-S1level.  PATIENT SURVEYS:  Modified Oswestry 20/50= 40% moderate disability    SCREENING FOR RED FLAGS: Bowel or bladder incontinence: No Spinal tumors: No Cauda equina syndrome: No Compression fracture: No Abdominal aneurysm: Yes: small, being monitored  COGNITION:  Overall cognitive status: History of cognitive impairments - at baseline for memory from CVA.      SENSATION: WFL  MUSCLE LENGTH: Hamstrings: Right 70 deg; Left 90 deg  POSTURE: decreased lumbar lordosis  PALPATION: Tenderness lumbar spine with PA mobs, L QL, L gluteus medius, R piriformis, R SIJ  LUMBAR ROM:   Active  AROM  eval  Flexion WNL **  Extension 75% limit **  Right lateral flexion To knee  Left lateral flexion To knee little pain  Right rotation WNL*  Left rotation WNL*   (Blank rows = not tested) *pain, **increased pain  LOWER EXTREMITY ROM:     Active  Right eval Left eval  Hip internal rotation 20 30  Hip external rotation 35 40  Knee flexion 120 130  Knee extension 5 0  Ankle dorsiflexion    Ankle plantarflexion     (Blank rows = not tested)  LOWER EXTREMITY MMT:  5/5 bil LE all myotomes.    LUMBAR SPECIAL TESTS:  Straight leg raise test: increased pain bilaterally, Slump test: Negative, and SI Compression/distraction test: Negative  FUNCTIONAL TESTS:  NT  GAIT: Distance walked: 43' Assistive device utilized: None Level of assistance: Complete Independence Comments: no significant deviation or device.  Walks 3-5,000 steps every morning.     TODAY'S TREATMENT  04/17/22 Bike L5x18mn Standing shoulder extension with lat pull 20# 2x15 Lat pull 35# 2x15 Standing pallof press 10# 2x10 bil  Manual Therapy: STM to B lumbar parapsinals  Moist heat x 10 min post session  04/13/22 Bike L4x873m Supine TrA brace with alt march (bil con/unilat ecc) 10x Supine TrA brace with isometric hold at 90/90 position 3x10" SLR in supine bil 2# weight x 10 Seated  pallof press with green TB narrow BOS, no back support x 15 bil BATCA row 25# 2x15   Manual Therapy: STM to Bil lumbar PS R S/L  Moist Heat to lumbar spine x 10 min  04/10/22 Therapeutic Exercise: to improve strength and mobility.  Bike L3 x 5 min  Bear position hold in quadruped 5 x 10 sec hold (knees on airex for extra cushion between sets) Child pose x 30 sec  Supine TrA brace with alternating march 3 x 10 bil Supine TrA brace with alternating arm extensions 2 x 10 bil Supine bridge with TrA brace and ball squeeze 3 x 10  Supine TrA brace with leg extensions 3 x 15 bil  Modalities: [place] 2 x 5 min 1MHz, 1.2 w/cm2 continuous to bil QL to decrease pain and spasm.        PATIENT EDUCATION:  Education details: HEP update - shoulder extension and pallof to be done at gym Person educated: Patient Education method: Explanation, Demonstration, Verbal cues, and Handouts Education comprehension: verbalized understanding and returned demonstration   HOME EXERCISE PROGRAM: Access Code: [email or web address]  ASSESSMENT:  CLINICAL IMPRESSION:  Good response to treatment with less pain noted today. Focused on gym exercises he could do to target postural strengthening and abdominal activation, also provided instructions and print outs were given. Pt responded well to [facility] last visit so we continued with this and ended with moist heat post session.   OBJECTIVE IMPAIRMENTS decreased activity tolerance, decreased mobility, decreased strength, increased fascial restrictions, impaired perceived functional ability, increased muscle spasms, improper body mechanics, postural dysfunction, and pain.   ACTIVITY LIMITATIONS carrying, lifting, bending, sitting, standing, sleeping, transfers, and bed mobility  PARTICIPATION LIMITATIONS: cleaning, laundry, shopping, community activity, and yard work  PERSONAL FACTORS Age, Time since onset of injury/illness/exacerbation, and 3+ comorbidities: recent R TKA, chronic  LBP, abdominal surgeries, seizure disorder, memory impairments secondary to CVA  are also affecting patient's functional outcome.   REHAB POTENTIAL: Good  CLINICAL DECISION MAKING: Stable/uncomplicated  EVALUATION COMPLEXITY: Low   GOALS: Goals reviewed with patient? Yes  SHORT TERM GOALS: Target date: 04/17/2022   Patient will be independent with initial HEP.  Baseline: given Goal status: MET   LONG TERM GOALS: Target date: 05/15/2022    Patient will be independent with advanced/ongoing HEP to improve outcomes and carryover.  Baseline: needs progression Goal status: IN PROGRESS  2.  Patient will report 75% improvement in low back pain to improve QOL.  Baseline: 8-10/10 LBP Goal status: IN PROGRESS  3.  Patient will report 75% improvement in sleep disruption due to low back pain.  Baseline: sleepless nightly 1-2 hours due to pain Goal status: IN PROGRESS  4.  Patient will demonstrate full pain free lumbar ROM to perform ADLs.   Baseline: pain all directions Goal status: IN PROGRESS  5.  Patient will demonstrate improved functional strength as demonstrated by no stomach bulging with bed mobility. Baseline: core weaknes Goal status: IN PROGRESS  6.  Patient will report 15/50 or less on Oswestry to demonstrate improved functional ability.  Baseline: 20/50 = moderate disability  Goal status: IN PROGRESS   7.  Patient will tolerate 45 min of sitting without increased LBP for traveling, eating meals, etc. Baseline: increased pain after 15 min sitting Goal status: IN PROGRESS  8.  Patient will be able to transfer from floor to standing safely.  Baseline: unable Goal status: IN PROGRESS    PLAN: PT FREQUENCY: 2x/week  PT DURATION: 6 weeks  PLANNED INTERVENTIONS: Therapeutic exercises, Therapeutic activity, Neuromuscular re-education, Balance training, Gait training, Patient/Family education, Self Care, Joint mobilization, Stair training, Dry Needling, Electrical  stimulation, Spinal mobilization, Moist heat, Taping, Traction, Ultrasound, Manual therapy, and Re-evaluation.  PLAN FOR NEXT SESSION: progress core strengthening exercises focusing on sitting/standing or supine. STM to decrease pain;  Add lunges to strength floor to standing transfers.  Does not tolerate prone positioning.  Manual therapy and modalities PRN.    Artist Pais, PTA 04/17/2022, 8:49 AM

## 2022-04-19 ENCOUNTER — Ambulatory Visit: Payer: Medicare HMO | Admitting: Physical Therapy

## 2022-04-19 ENCOUNTER — Encounter: Payer: Self-pay | Admitting: Physical Therapy

## 2022-04-19 DIAGNOSIS — M6281 Muscle weakness (generalized): Secondary | ICD-10-CM

## 2022-04-19 DIAGNOSIS — R252 Cramp and spasm: Secondary | ICD-10-CM

## 2022-04-19 DIAGNOSIS — M25561 Pain in right knee: Secondary | ICD-10-CM | POA: Diagnosis not present

## 2022-04-19 DIAGNOSIS — G8929 Other chronic pain: Secondary | ICD-10-CM

## 2022-04-19 NOTE — Therapy (Signed)
OUTPATIENT PHYSICAL THERAPY TREATMENT   Patient Name: Erik Salazar MRN: [id number] DOB:[birth year], 71 y.o., male Today's Date: 04/19/2022   PT End of Session - 04/19/22 0803     Visit Number 6    Number of Visits 12    Date for PT Re-Evaluation 05/15/22    Authorization Type [employer]    Progress Note Due on Visit 10    PT Start Time 0801    PT Stop Time 0847    PT Time Calculation (min) 46 min    Activity Tolerance Patient tolerated treatment well    Behavior During Therapy [facility] for tasks assessed/performed                Past Medical History:  Diagnosis Date   Arthritis    RA and OA   Carpal tunnel syndrome on right 07/08/2017   Cervical radiculopathy at C6 46/65/9935   Right   Complication of anesthesia    Coronary artery disease    Enlarged prostate    Fibromyalgia    H/O: knee surgery    15    History of blood clots    History of open heart surgery    ASCENDING AORTIC ANEURYSM   Ischemic stroke of frontal lobe ([place]) 03/14/2017   Bilateral; post-redo CT surgery   Pneumonia    PONV (postoperative nausea and vomiting)    only after CABG surgeries   Seizure disorder ([place]) 03/14/2017   Seizures ([place])    Status post knee surgery    DVT POST KNEE SURGERY   Stroke [facility])    Past Surgical History:  Procedure Laterality Date   ANTERIOR CERVICAL DECOMP/DISCECTOMY FUSION N/A 05/04/2020   Procedure: Anterior Cervical Decompression/Discectomy Fuion Cervical three-four, Cervical four-five, Cervical five-six;  Surgeon: Kary Kos, MD;  Location: [place];  Service: Neurosurgery;  Laterality: N/A;   BALLOON DILATION N/A 06/23/2019   Procedure: BALLOON DILATION;  Surgeon: Otis Brace, MD;  Location: [facility];  Service: Gastroenterology;  Laterality: N/A;   [place] PROCEDURE  01/04/2016   [place] with 23 mm pericardial AVR; SVG-LAD, SVG-CX ([place])   BICEPS TENDON REPAIR Right    BIOPSY  06/23/2019   Procedure: BIOPSY;  Surgeon:  Otis Brace, MD;  Location: [facility];  Service: Gastroenterology;;   CARDIAC SURGERY     ANUERSYM MAY 2017   [place] procedure. Bioprosthetic aortic valve #23 mm bovine model #2700 TF ask, and 28 mm Gelweave woven vascular sinus of Valsalva graft   CARPAL TUNNEL RELEASE  08/2018   right hand    CORONARY ARTERY BYPASS GRAFT  01/04/2016   VG to LAD & VG to LCX   CORONARY ARTERY BYPASS GRAFT  03/13/2017   LIMA to LAD with steril abcess removal from dacron graft   ESOPHAGOGASTRODUODENOSCOPY     Had done dilatation done about 2 or 3 times before in [place]   ESOPHAGOGASTRODUODENOSCOPY (EGD) WITH PROPOFOL N/A 06/23/2019   Procedure: ESOPHAGOGASTRODUODENOSCOPY (EGD) WITH PROPOFOL;  Surgeon: Otis Brace, MD;  [facility]: [facility];  Service: Gastroenterology;  Laterality: N/A;   FALSE ANEURYSM REPAIR  03/13/2017   redo sternotomy, sterile abscess removal from Dacron graft, omental flap around aorta, CABG: LIMA-LAD ([name], Dr. Mart Piggs)   [place]  2018   Of pericardium 2018   INSERTION OF MESH  10/14/2020   Procedure: INSERTION OF MESH;  Surgeon: Michael Boston, MD;  Location: [place] [facility];  Service: General;;   knee surgeries     13 surgeries on knee done  before knee replacement    KNEE SURGERY  1983   LAPAROSCOPIC LYSIS OF ADHESIONS N/A 05/19/2021   Procedure: LYSIS OF ADHESIONS;  Surgeon: Michael Boston, MD;  Location: [place];  Service: General;  Laterality: N/A;  GEN & LOCAL   LYSIS OF ADHESION N/A 10/14/2020   Procedure: LYSIS OF ADHESION;  Surgeon: Michael Boston, MD;  Location: [place];  Service: General;  Laterality: N/A;   open heart surgery     03-13-2017   REPLACEMENT TOTAL KNEE Left 2015   ROTATOR CUFF REPAIR Right    done with biceps tendon repair   TONSILLECTOMY     removed as a child.   TOTAL KNEE ARTHROPLASTY Right 01/18/2022   Procedure: RIGHT TOTAL KNEE ARTHROPLASTY;  Surgeon: Leandrew Koyanagi, MD;  Location: [place];  Service: Orthopedics;  Laterality:  Right;   VENTRAL HERNIA REPAIR N/A 10/14/2020   Procedure: LAPAROSCOPIC VENTRAL HERNIA REPAIR WITH TAP BLOCK BILATERAL;  Surgeon: Michael Boston, MD;  Location: [place];  Service: General;  Laterality: N/A;   VENTRAL HERNIA REPAIR N/A 05/19/2021   Procedure: LAPAROSCOPIC VENTRAL WALL HERNIA REPAIR;  Surgeon: Michael Boston, MD;  Location: [place];  Service: General;  Laterality: N/A;   Patient Active Problem List   Diagnosis Date Noted   Postop check 03/26/2022   Gait abnormality 01/29/2022   Status post total right knee replacement 01/18/2022   Coronary artery disease involving native coronary artery of native heart without angina pectoris 01/04/2022   Preop cardiovascular exam 01/04/2022   Primary osteoarthritis of right knee 01/04/2022   Acute right-sided low back pain without sciatica 12/25/2021   Aortic atherosclerosis ([place]) 09/04/2021   Spondylolisthesis at L4-L5 level 06/28/2021   S/P repair of ventral hernia 05/19/2021   S/P repair of recurrent ventral hernia 05/19/2021   Benign prostatic hyperplasia without lower urinary tract symptoms 10/14/2020   Chronic pain 10/14/2020   ED (erectile dysfunction) of organic origin 10/14/2020   Esophageal dysphagia 10/14/2020   Gastroesophageal reflux disease 10/14/2020   History of aortic valve replacement with bioprosthetic valve 10/14/2020   Hx of aortic aneurysm repair 10/14/2020   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   PERTINENT HISTORY:  History chronic back pain, multiple back surgeries, bil  knee replacement (R knee on 01/18/2022), DVT, CVA frontal lobe with memory deficits, seizures, open heart surgery to repair aneurism, CABG x 2 , ACDF, hernia repair,omental flap graft to mediastinum  PAIN:  Are you having pain? Yes: NPRS scale: 01/24/09 Pain location: across low back  Pain description: shooting sharp pain, sometimes dull.  13/10 at night.  Aggravating factors: laying down, walking long distances, sitting >30 min  Relieving factors: changing positions constantly   PRECAUTIONS: None  WEIGHT BEARING RESTRICTIONS No  FALLS:  Has patient fallen in last 6 months? No  LIVING ENVIRONMENT: Lives with: lives with their spouse Lives in: House/apartment Stairs: No Has following equipment at home: Single point cane, Environmental consultant - 2 wheeled, and bed side commode  OCCUPATION: retired Curator  PLOF: Independent  walks daily 8k steps/day, also goes to gym  PATIENT GOALS decrease low back pain, sleep better.    OBJECTIVE:   DIAGNOSTIC FINDINGS:  Lumbar spine 06/28/2021 IMPRESSION: Fluoroscopic assistance was provided for surgical fusion at L5-S1level.  PATIENT  SURVEYS:  Modified Oswestry 20/50= 40% moderate disability    SCREENING FOR RED FLAGS: Bowel or bladder incontinence: No Spinal tumors: No Cauda equina syndrome: No Compression fracture: No Abdominal aneurysm: Yes: small, being monitored  COGNITION:  Overall cognitive status: History of cognitive impairments - at baseline for memory from CVA.      SENSATION: WFL  MUSCLE LENGTH: Hamstrings: Right 70 deg; Left 90 deg  POSTURE: decreased lumbar lordosis  PALPATION: Tenderness lumbar spine with PA mobs, L QL, L gluteus medius, R piriformis, R SIJ  LUMBAR ROM:   Active  AROM  eval  Flexion WNL **  Extension 75% limit **  Right lateral flexion To knee  Left lateral flexion To knee little pain  Right rotation WNL*  Left rotation WNL*   (Blank rows = not tested) *pain, **increased pain  LOWER EXTREMITY ROM:     Active  Right eval Left eval  Hip internal rotation 20 30  Hip external rotation 35 40  Knee flexion 120 130  Knee extension 5 0  Ankle dorsiflexion    Ankle plantarflexion     (Blank rows = not tested)  LOWER EXTREMITY MMT:  5/5 bil LE all myotomes.    LUMBAR SPECIAL TESTS:  Straight leg raise test: increased pain bilaterally, Slump test: Negative, and SI Compression/distraction test: Negative  FUNCTIONAL TESTS:  NT  GAIT: Distance walked: 46' Assistive device utilized: None Level of assistance: Complete Independence Comments: no significant deviation or device.  Walks 3-5,000 steps every morning.     TODAY'S TREATMENT  04/19/2022 Therapeutic Exercise: to improve strength and mobility.  Demo, verbal and tactile cues throughout for technique. Bike L4 x 7 min (2 miles) Hip openers at door 2 x 10 bil  Hip flexion/extension with hip hiked 2x 10 bil  Leg extensions leaning on ball 2 x 10 bil  Plank on ball (on table) 3 x 30 sec hold Hip circles seated on ball Paloff press seated on ball GTB x 20 bil  Lunges 2 x 10 bil - 1 UE support on counter.   Manual Therapy: to decrease muscle spasm and pain and improve mobility STM/TPR R piriformis/glut med; skilled palpation and monitoring during dry needling. Trigger Point Dry-Needling  Treatment instructions: Expect mild to moderate muscle soreness. S/S of pneumothorax if dry needled over a  lung field, and to seek immediate medical attention should they occur. Patient verbalized understanding of these instructions and education.  Patient Consent Given: Yes Education handout provided: Previously provided Muscles treated: R piriformis Treatment response/outcome: Twitch Response Elicited and Palpable Increase in Muscle Length    04/17/22 Bike L5x51mn Standing shoulder extension with lat pull 20# 2x15 Lat pull 35# 2x15 Standing pallof press 10# 2x10 bil  Manual Therapy: STM to B lumbar parapsinals  Moist heat x 10 min post session  04/13/22 Bike L4x838m Supine TrA brace with alt march (bil con/unilat ecc) 10x Supine TrA brace with isometric hold at 90/90 position 3x10" SLR in supine bil 2# weight x 10 Seated pallof press with green TB narrow BOS, no back support x 15 bil BATCA row 25# 2x15   Manual Therapy: STM to Bil lumbar PS R S/L  Moist Heat to lumbar spine x 10 min  04/10/22 Therapeutic Exercise: to improve strength and mobility.  Bike L3 x 5 min  Bear position hold in quadruped 5 x 10 sec hold (knees on airex for extra cushion between sets) Child pose x 30 sec  Supine TrA brace with alternating march 3 x 10 bil Supine TrA brace with alternating arm extensions 2 x 10 bil Supine bridge with TrA brace and ball squeeze 3 x 10  Supine TrA brace with leg extensions 3 x 15 bil  Modalities: [place] x 5 min 1MHz, 1.2 w/cm2 continuous to bil QL to decrease pain and spasm.        PATIENT EDUCATION:  Education details: HEP update  Person educated: Patient Education method: Explanation, Demonstration, Verbal cues, and Handouts Education comprehension: verbalized understanding  and returned demonstration   HOME EXERCISE PROGRAM: Access Code: [email or web address]:  CLINICAL IMPRESSION: Erik Salazar still reports significant pain at night making him sleepless for hours.  Today focused exercises on decreasing hip tightness and core strengthening with planks/leg extensions leaning on elevated ball to challenge core.  Attempted supine rollouts on ball as well but unable to engage core with this exercise due to weakness.  He reported tightness in R piriformis after walking on different gradients yesterday, this responded well to TrDN.  He reported decreased tightness and pain following session.  Erik Salazar continues to demonstrate potential for improvement and would benefit from continued skilled therapy to address impairments.      OBJECTIVE IMPAIRMENTS decreased activity tolerance, decreased mobility, decreased strength, increased fascial restrictions, impaired perceived functional ability, increased muscle spasms, improper body mechanics, postural dysfunction, and pain.   ACTIVITY LIMITATIONS carrying, lifting, bending, sitting, standing, sleeping, transfers, and bed mobility  PARTICIPATION LIMITATIONS: cleaning, laundry, shopping, community activity, and yard work  PERSONAL FACTORS Age, Time since onset of injury/illness/exacerbation, and 3+ comorbidities: recent R TKA, chronic LBP, abdominal surgeries, seizure disorder, memory impairments secondary to CVA  are also affecting patient's functional outcome.   REHAB POTENTIAL: Good  CLINICAL DECISION MAKING: Stable/uncomplicated  EVALUATION COMPLEXITY: Low   GOALS: Goals reviewed with patient? Yes  SHORT TERM GOALS: Target date: 04/17/2022   Patient will be independent with initial HEP.  Baseline: given Goal status: MET   LONG TERM GOALS: Target date: 05/15/2022    Patient will be independent with advanced/ongoing HEP to improve outcomes and carryover.  Baseline: needs progression Goal status: IN  PROGRESS  2.  Patient will report 75% improvement in low back pain to improve QOL.  Baseline: 8-10/10 LBP Goal status: IN PROGRESS  3.  Patient will report 75% improvement in sleep  disruption due to low back pain.  Baseline: sleepless nightly 1-2 hours due to pain Goal status: IN PROGRESS  4.  Patient will demonstrate full pain free lumbar ROM to perform ADLs.   Baseline: pain all directions Goal status: IN PROGRESS  5.  Patient will demonstrate improved functional strength as demonstrated by no stomach bulging with bed mobility. Baseline: core weaknes Goal status: IN PROGRESS  6.  Patient will report 15/50 or less on Oswestry to demonstrate improved functional ability.  Baseline: 20/50 = moderate disability  Goal status: IN PROGRESS   7.  Patient will tolerate 45 min of sitting without increased LBP for traveling, eating meals, etc. Baseline: increased pain after 15 min sitting Goal status: IN PROGRESS  8.  Patient will be able to transfer from floor to standing safely.  Baseline: unable Goal status: IN PROGRESS    PLAN: PT FREQUENCY: 2x/week  PT DURATION: 6 weeks  PLANNED INTERVENTIONS: Therapeutic exercises, Therapeutic activity, Neuromuscular re-education, Balance training, Gait training, Patient/Family education, Self Care, Joint mobilization, Stair training, Dry Needling, Electrical stimulation, Spinal mobilization, Moist heat, Taping, Traction, Ultrasound, Manual therapy, and Re-evaluation.  PLAN FOR NEXT SESSION: progress core strengthening exercises focusing on sitting/standing or supine. STM to decrease pain; Add lunges to strength floor to standing transfers.  Does not tolerate prone positioning.  Manual therapy and modalities PRN.   Need to print HEP update    Rennie Natter, PT, DPT  04/19/2022, 8:59 AM

## 2022-04-24 ENCOUNTER — Encounter: Payer: Self-pay | Admitting: Physical Therapy

## 2022-04-24 ENCOUNTER — Ambulatory Visit: Payer: Medicare HMO | Attending: Orthopaedic Surgery | Admitting: Physical Therapy

## 2022-04-24 DIAGNOSIS — R252 Cramp and spasm: Secondary | ICD-10-CM | POA: Insufficient documentation

## 2022-04-24 DIAGNOSIS — M5442 Lumbago with sciatica, left side: Secondary | ICD-10-CM | POA: Insufficient documentation

## 2022-04-24 DIAGNOSIS — M6281 Muscle weakness (generalized): Secondary | ICD-10-CM | POA: Insufficient documentation

## 2022-04-24 DIAGNOSIS — G8929 Other chronic pain: Secondary | ICD-10-CM | POA: Diagnosis present

## 2022-04-24 NOTE — Therapy (Signed)
OUTPATIENT PHYSICAL THERAPY TREATMENT   Patient Name: Erik Salazar MRN: [id number] DOB:03-21-1951, 71 y.o., male Today's Date: 04/24/2022   PT End of Session - 04/24/22 0805     Visit Number 7    Number of Visits 12    Date for PT Re-Evaluation 05/15/22    Authorization Type [employer]    Progress Note Due on Visit 10    PT Start Time 0803    PT Stop Time 0847    PT Time Calculation (min) 44 min    Activity Tolerance Patient tolerated treatment well    Behavior During Therapy [facility] for tasks assessed/performed                Past Medical History:  Diagnosis Date   Arthritis    RA and OA   Carpal tunnel syndrome on right 07/08/2017   Cervical radiculopathy at C6 77/06/6578   Right   Complication of anesthesia    Coronary artery disease    Enlarged prostate    Fibromyalgia    H/O: knee surgery    15    History of blood clots    History of open heart surgery    ASCENDING AORTIC ANEURYSM   Ischemic stroke of frontal lobe ([place]) 03/14/2017   Bilateral; post-redo CT surgery   Pneumonia    PONV (postoperative nausea and vomiting)    only after CABG surgeries   Seizure disorder ([place]) 03/14/2017   Seizures ([place])    Status post knee surgery    DVT POST KNEE SURGERY   Stroke [facility])    Past Surgical History:  Procedure Laterality Date   ANTERIOR CERVICAL DECOMP/DISCECTOMY FUSION N/A 05/04/2020   Procedure: Anterior Cervical Decompression/Discectomy Fuion Cervical three-four, Cervical four-five, Cervical five-six;  Surgeon: Kary Kos, MD;  Location: [place];  Service: Neurosurgery;  Laterality: N/A;   BALLOON DILATION N/A 06/23/2019   Procedure: BALLOON DILATION;  Surgeon: Otis Brace, MD;  [facility]: [facility];  Service: Gastroenterology;  Laterality: N/A;   BENTALL PROCEDURE  01/04/2016   Bentall with 23 mm pericardial AVR; SVG-LAD, SVG-CX ([place])   BICEPS TENDON REPAIR Right    BIOPSY  06/23/2019   Procedure: BIOPSY;  Surgeon:  Otis Brace, MD;  Location: [facility];  Service: Gastroenterology;;   CARDIAC SURGERY     ANUERSYM MAY 2017   Bentall procedure. Bioprosthetic aortic valve #23 mm bovine model #2700 TF ask, and 28 mm Gelweave woven vascular sinus of Valsalva graft   CARPAL TUNNEL RELEASE  08/2018   right hand    CORONARY ARTERY BYPASS GRAFT  01/04/2016   VG to LAD & VG to LCX   CORONARY ARTERY BYPASS GRAFT  03/13/2017   LIMA to LAD with steril abcess removal from dacron graft   ESOPHAGOGASTRODUODENOSCOPY     Had done dilatation done about 2 or 3 times before in [place]   ESOPHAGOGASTRODUODENOSCOPY (EGD) WITH PROPOFOL N/A 06/23/2019   Procedure: ESOPHAGOGASTRODUODENOSCOPY (EGD) WITH PROPOFOL;  Surgeon: Otis Brace, MD;  [facility];  Service: Gastroenterology;  Laterality: N/A;   FALSE ANEURYSM REPAIR  03/13/2017   redo sternotomy, sterile abscess removal from Dacron graft, omental flap around aorta, CABG: LIMA-LAD ([name], Dr. Mart Piggs)   [place]  2018   Of pericardium 2018   INSERTION OF MESH  10/14/2020   Procedure: INSERTION OF MESH;  Surgeon: Michael Boston, MD;  Location: [place] [facility];  Service: General;;   knee surgeries     13 surgeries on knee done  before knee replacement    KNEE SURGERY  1983   LAPAROSCOPIC LYSIS OF ADHESIONS N/A 05/19/2021   Procedure: LYSIS OF ADHESIONS;  Surgeon: Michael Boston, MD;  Location: [place];  Service: General;  Laterality: N/A;  GEN & LOCAL   LYSIS OF ADHESION N/A 10/14/2020   Procedure: LYSIS OF ADHESION;  Surgeon: Michael Boston, MD;  Location: [place];  Service: General;  Laterality: N/A;   open heart surgery     03-13-2017   REPLACEMENT TOTAL KNEE Left 2015   ROTATOR CUFF REPAIR Right    done with biceps tendon repair   TONSILLECTOMY     removed as a child.   TOTAL KNEE ARTHROPLASTY Right 01/18/2022   Procedure: RIGHT TOTAL KNEE ARTHROPLASTY;  Surgeon: Leandrew Koyanagi, MD;  Location: [place];  Service: Orthopedics;  Laterality:  Right;   VENTRAL HERNIA REPAIR N/A 10/14/2020   Procedure: LAPAROSCOPIC VENTRAL HERNIA REPAIR WITH TAP BLOCK BILATERAL;  Surgeon: Michael Boston, MD;  Location: [place];  Service: General;  Laterality: N/A;   VENTRAL HERNIA REPAIR N/A 05/19/2021   Procedure: LAPAROSCOPIC VENTRAL WALL HERNIA REPAIR;  Surgeon: Michael Boston, MD;  Location: [place];  Service: General;  Laterality: N/A;   Patient Active Problem List   Diagnosis Date Noted   Postop check 03/26/2022   Gait abnormality 01/29/2022   Status post total right knee replacement 01/18/2022   Coronary artery disease involving native coronary artery of native heart without angina pectoris 01/04/2022   Preop cardiovascular exam 01/04/2022   Primary osteoarthritis of right knee 01/04/2022   Acute right-sided low back pain without sciatica 12/25/2021   Aortic atherosclerosis ([place]) 09/04/2021   Spondylolisthesis at L4-L5 level 06/28/2021   S/P repair of ventral hernia 05/19/2021   S/P repair of recurrent ventral hernia 05/19/2021   Benign prostatic hyperplasia without lower urinary tract symptoms 10/14/2020   Chronic pain 10/14/2020   ED (erectile dysfunction) of organic origin 10/14/2020   Esophageal dysphagia 10/14/2020   Gastroesophageal reflux disease 10/14/2020   History of aortic valve replacement with bioprosthetic valve 10/14/2020   Hx of aortic aneurysm repair 10/14/2020   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  Still having a lot of pain at night and not sleeping.  Seeing surgeon next Tuesday.     PERTINENT HISTORY:  History chronic back pain, multiple back surgeries, bil  knee replacement (R knee on 01/18/2022), DVT, CVA frontal lobe with memory deficits, seizures, open heart surgery to repair aneurism, CABG x 2 , ACDF, hernia repair,omental flap graft to mediastinum  PAIN:  Are you having pain? Yes: NPRS scale: 01/24/09 Pain location: across low back  Pain description: shooting sharp pain, sometimes dull.  13/10 at night.  Aggravating factors: laying down, walking long distances, sitting >30 min  Relieving factors: changing positions constantly   PRECAUTIONS: None  WEIGHT BEARING RESTRICTIONS No  FALLS:  Has patient fallen in last 6 months? No  LIVING ENVIRONMENT: Lives with: lives with their spouse Lives in: House/apartment Stairs: No Has following equipment at home: Single point cane, Environmental consultant - 2 wheeled, and bed side commode  OCCUPATION: retired Curator  PLOF: Independent  walks daily 8k steps/day, also goes to gym  PATIENT GOALS decrease low back pain, sleep better.    OBJECTIVE:   DIAGNOSTIC FINDINGS:  Lumbar spine 06/28/2021  IMPRESSION: Fluoroscopic assistance was provided for surgical fusion at L5-S1level.  PATIENT SURVEYS:  Modified Oswestry 20/50= 40% moderate disability    SCREENING FOR RED FLAGS: Bowel or bladder incontinence: No Spinal tumors: No Cauda equina syndrome: No Compression fracture: No Abdominal aneurysm: Yes: small, being monitored  COGNITION:  Overall cognitive status: History of cognitive impairments - at baseline for memory from CVA.      SENSATION: WFL  MUSCLE LENGTH: Hamstrings: Right 70 deg; Left 90 deg  POSTURE: decreased lumbar lordosis  PALPATION: Tenderness lumbar spine with PA mobs, L QL, L gluteus medius, R piriformis, R SIJ  LUMBAR ROM:   Active  AROM  eval  Flexion WNL **  Extension 75% limit **  Right lateral flexion To knee  Left lateral flexion To knee little pain  Right rotation WNL*  Left rotation WNL*   (Blank rows = not tested) *pain, **increased pain  LOWER EXTREMITY ROM:     Active  Right eval Left eval  Hip internal rotation 20 30  Hip external rotation 35 40  Knee flexion 120 130  Knee extension 5 0  Ankle dorsiflexion    Ankle plantarflexion     (Blank rows = not tested)  LOWER EXTREMITY MMT:  5/5 bil LE all myotomes.    LUMBAR SPECIAL TESTS:  Straight leg raise test: increased pain bilaterally, Slump test: Negative, and SI Compression/distraction test: Negative  FUNCTIONAL TESTS:  NT  GAIT: Distance walked: 45' Assistive device utilized: None Level of assistance: Complete Independence Comments: no significant deviation or device.  Walks 3-5,000 steps every morning.     TODAY'S TREATMENT  04/24/2022 Therapeutic Exercise: to improve strength and mobility.  Demo, verbal and tactile cues throughout for technique. Nustep L6 x 5 min  Hip Openers at door x 15 bil  Leg extensions while planking on ball (on table) 2 x 15 bil  Paloff press 10# x 15 bil  Lunges x 15 bil Side lunge with foot on towel to encourage SLS x 15 bil   Back lunge with foot on towel to encourage SLS x 15 bil  QL stretch in doorway Manual Therapy: to decrease muscle spasm and pain and improve mobility IASTM with foam roller to bil glutes, proximal hamstrings, lumbar paraspinals, STM/TPR to bil QL, glut med,  piriformis, MFR bil QL.    04/19/2022 Therapeutic Exercise: to improve strength and mobility.  Demo, verbal and tactile cues throughout for technique. Bike L4 x 7 min (2 miles) Hip openers at door 2 x 10 bil  Hip flexion/extension with hip hiked 2x 10 bil  Leg extensions leaning on ball 2 x 10 bil  Plank on ball (on table) 3 x 30 sec hold Hip circles seated on ball Paloff press seated on ball GTB x 20 bil  Lunges 2 x 10 bil - 1 UE support on counter.  Manual Therapy: to decrease muscle spasm and pain and improve mobility STM/TPR R piriformis/glut med; skilled palpation and monitoring during dry needling. Trigger Point Dry-Needling  Treatment instructions: Expect mild to moderate muscle soreness. S/S of pneumothorax if dry needled over a lung field, and to seek immediate medical attention should they occur. Patient verbalized understanding of these instructions and education.  Patient Consent Given: Yes Education handout provided: Previously provided Muscles treated: R piriformis Treatment response/outcome: Twitch Response Elicited and Palpable Increase in Muscle Length    04/17/22 Bike L5x41mn Standing shoulder extension with lat pull 20# 2x15 Lat pull 35# 2x15 Standing pallof press 10# 2x10 bil  Manual Therapy: STM to B lumbar parapsinals  Moist heat x 10 min post session  04/13/22 Bike L4x81m Supine TrA brace with alt march (bil con/unilat ecc) 10x Supine TrA brace with isometric hold at 90/90 position 3x10" SLR in supine bil 2# weight x 10 Seated pallof press with green TB narrow BOS, no back support x 15 bil BATCA row 25# 2x15   Manual Therapy: STM to Bil lumbar PS R S/L  Moist Heat to lumbar spine x 10  min   PATIENT EDUCATION:  Education details: HEP update  Person educated: Patient Education method: Explanation, Demonstration, Verbal cues, and Handouts Education comprehension: verbalized understanding and returned demonstration   HOME EXERCISE PROGRAM: Access Code: [email or web address]:  CLINICAL IMPRESSION: Erik Salazar still reports significant pain at night making him sleepless for hours.  Encouraged to discuss this with referring MD, mentioned pain management but he was very resistant at idea.  Continued to focus exercises on decreasing hip tightness and core strengthening.  Challeged with sequencing lunges and keeping weight over stance leg.  Manual therapy focused on bil QL as this area is painful today.   Erik Salazar continues to demonstrate potential for improvement and would benefit from continued skilled therapy to address impairments.      OBJECTIVE IMPAIRMENTS decreased activity tolerance, decreased mobility, decreased strength, increased fascial restrictions, impaired perceived functional ability, increased muscle spasms, improper body mechanics, postural dysfunction, and pain.   ACTIVITY LIMITATIONS carrying, lifting, bending, sitting, standing, sleeping, transfers, and bed mobility  PARTICIPATION LIMITATIONS: cleaning, laundry, shopping, community activity, and yard work  PERSONAL FACTORS Age, Time since onset of injury/illness/exacerbation, and 3+ comorbidities: recent R TKA, chronic LBP, abdominal surgeries, seizure disorder, memory impairments secondary to CVA  are also affecting patient's functional outcome.   REHAB POTENTIAL: Good  CLINICAL DECISION MAKING: Stable/uncomplicated  EVALUATION COMPLEXITY: Low   GOALS: Goals reviewed with patient? Yes  SHORT TERM GOALS: Target date: 04/17/2022   Patient will be independent with initial HEP.  Baseline: given Goal status: MET   LONG TERM GOALS: Target date: 05/15/2022    Patient will be independent with  advanced/ongoing HEP to improve outcomes and carryover.  Baseline: needs progression Goal status: IN PROGRESS  2.  Patient will report 75% improvement in low back pain to improve  QOL.  Baseline: 8-10/10 LBP Goal status: IN PROGRESS  3.  Patient will report 75% improvement in sleep disruption due to low back pain.  Baseline: sleepless nightly 1-2 hours due to pain Goal status: IN PROGRESS  4.  Patient will demonstrate full pain free lumbar ROM to perform ADLs.   Baseline: pain all directions Goal status: IN PROGRESS  5.  Patient will demonstrate improved functional strength as demonstrated by no stomach bulging with bed mobility. Baseline: core weaknes Goal status: IN PROGRESS  6.  Patient will report 15/50 or less on Oswestry to demonstrate improved functional ability.  Baseline: 20/50 = moderate disability  Goal status: IN PROGRESS   7.  Patient will tolerate 45 min of sitting without increased LBP for traveling, eating meals, etc. Baseline: increased pain after 15 min sitting Goal status: IN PROGRESS  8.  Patient will be able to transfer from floor to standing safely.  Baseline: unable Goal status: IN PROGRESS    PLAN: PT FREQUENCY: 2x/week  PT DURATION: 6 weeks  PLANNED INTERVENTIONS: Therapeutic exercises, Therapeutic activity, Neuromuscular re-education, Balance training, Gait training, Patient/Family education, Self Care, Joint mobilization, Stair training, Dry Needling, Electrical stimulation, Spinal mobilization, Moist heat, Taping, Traction, Ultrasound, Manual therapy, and Re-evaluation.  PLAN FOR NEXT SESSION: progress core strengthening exercises focusing on sitting/standing or supine. STM to decrease pain;  Does not tolerate prone positioning.  Manual therapy and modalities PRN.     Rennie Natter, PT, DPT  04/24/2022, 11:58 AM

## 2022-04-26 ENCOUNTER — Ambulatory Visit: Payer: Medicare HMO

## 2022-05-01 ENCOUNTER — Encounter: Payer: Medicare HMO | Admitting: Physical Therapy

## 2022-05-02 ENCOUNTER — Encounter: Payer: Self-pay | Admitting: Physical Therapy

## 2022-05-02 ENCOUNTER — Ambulatory Visit: Payer: Medicare HMO | Admitting: Physical Therapy

## 2022-05-02 DIAGNOSIS — R252 Cramp and spasm: Secondary | ICD-10-CM

## 2022-05-02 DIAGNOSIS — M6281 Muscle weakness (generalized): Secondary | ICD-10-CM

## 2022-05-02 DIAGNOSIS — G8929 Other chronic pain: Secondary | ICD-10-CM

## 2022-05-02 DIAGNOSIS — M5442 Lumbago with sciatica, left side: Secondary | ICD-10-CM | POA: Diagnosis not present

## 2022-05-02 NOTE — Therapy (Signed)
OUTPATIENT PHYSICAL THERAPY TREATMENT   Patient Name: Erik Salazar MRN: [id number] DOB:June 21, 1951, 71 y.o., male Today's Date: 05/02/2022   PT End of Session - 05/02/22 0807     Visit Number 8    Number of Visits 12    Date for PT Re-Evaluation 05/15/22    Authorization Type [employer]    Progress Note Due on Visit 10    PT Start Time 0802    PT Stop Time 0846    PT Time Calculation (min) 44 min    Activity Tolerance Patient tolerated treatment well    Behavior During Therapy [facility] for tasks assessed/performed                Past Medical History:  Diagnosis Date   Arthritis    RA and OA   Carpal tunnel syndrome on right 07/08/2017   Cervical radiculopathy at C6 55/73/2202   Right   Complication of anesthesia    Coronary artery disease    Enlarged prostate    Fibromyalgia    H/O: knee surgery    15    History of blood clots    History of open heart surgery    ASCENDING AORTIC ANEURYSM   Ischemic stroke of frontal lobe ([place]) 03/14/2017   Bilateral; post-redo CT surgery   Pneumonia    PONV (postoperative nausea and vomiting)    only after CABG surgeries   Seizure disorder ([place]) 03/14/2017   Seizures ([place])    Status post knee surgery    DVT POST KNEE SURGERY   Stroke [facility])    Past Surgical History:  Procedure Laterality Date   ANTERIOR CERVICAL DECOMP/DISCECTOMY FUSION N/A 05/04/2020   Procedure: Anterior Cervical Decompression/Discectomy Fuion Cervical three-four, Cervical four-five, Cervical five-six;  Surgeon: Kary Kos, MD;  Location: [place];  Service: Neurosurgery;  Laterality: N/A;   BALLOON DILATION N/A 06/23/2019   Procedure: BALLOON DILATION;  Surgeon: Otis Brace, MD;  [facility];  Service: Gastroenterology;  Laterality: N/A;   BENTALL PROCEDURE  01/04/2016   Bentall with 23 mm pericardial AVR; SVG-LAD, SVG-CX ([place])   BICEPS TENDON REPAIR Right    BIOPSY  06/23/2019   Procedure: BIOPSY;  Surgeon:  Otis Brace, MD;  [facility];  Service: Gastroenterology;;   CARDIAC SURGERY     ANUERSYM MAY 2017   Bentall procedure. Bioprosthetic aortic valve #23 mm bovine model #2700 TF ask, and 28 mm Gelweave woven vascular sinus of Valsalva graft   CARPAL TUNNEL RELEASE  08/2018   right hand    CORONARY ARTERY BYPASS GRAFT  01/04/2016   VG to LAD & VG to LCX   CORONARY ARTERY BYPASS GRAFT  03/13/2017   LIMA to LAD with steril abcess removal from dacron graft   ESOPHAGOGASTRODUODENOSCOPY     Had done dilatation done about 2 or 3 times before in [place]   ESOPHAGOGASTRODUODENOSCOPY (EGD) WITH PROPOFOL N/A 06/23/2019   Procedure: ESOPHAGOGASTRODUODENOSCOPY (EGD) WITH PROPOFOL;  Surgeon: Otis Brace, MD;  [facility];  Service: Gastroenterology;  Laterality: N/A;   FALSE ANEURYSM REPAIR  03/13/2017   redo sternotomy, sterile abscess removal from Dacron graft, omental flap around aorta, CABG: LIMA-LAD ([name], Dr. Mart Piggs)   [place]  2018   Of pericardium 2018   INSERTION OF MESH  10/14/2020   Procedure: INSERTION OF MESH;  Surgeon: Michael Boston, MD;  Location: [place] OR;  Service: General;;   knee surgeries     13 surgeries on knee done  before knee replacement    KNEE SURGERY  1983   LAPAROSCOPIC LYSIS OF ADHESIONS N/A 05/19/2021   Procedure: LYSIS OF ADHESIONS;  Surgeon: Michael Boston, MD;  Location: [place];  Service: General;  Laterality: N/A;  GEN & LOCAL   LYSIS OF ADHESION N/A 10/14/2020   Procedure: LYSIS OF ADHESION;  Surgeon: Michael Boston, MD;  Location: [place];  Service: General;  Laterality: N/A;   open heart surgery     03-13-2017   REPLACEMENT TOTAL KNEE Left 2015   ROTATOR CUFF REPAIR Right    done with biceps tendon repair   TONSILLECTOMY     removed as a child.   TOTAL KNEE ARTHROPLASTY Right 01/18/2022   Procedure: RIGHT TOTAL KNEE ARTHROPLASTY;  Surgeon: Leandrew Koyanagi, MD;  Location: [place];  Service: Orthopedics;  Laterality:  Right;   VENTRAL HERNIA REPAIR N/A 10/14/2020   Procedure: LAPAROSCOPIC VENTRAL HERNIA REPAIR WITH TAP BLOCK BILATERAL;  Surgeon: Michael Boston, MD;  Location: [place];  Service: General;  Laterality: N/A;   VENTRAL HERNIA REPAIR N/A 05/19/2021   Procedure: LAPAROSCOPIC VENTRAL WALL HERNIA REPAIR;  Surgeon: Michael Boston, MD;  Location: [place];  Service: General;  Laterality: N/A;   Patient Active Problem List   Diagnosis Date Noted   Postop check 03/26/2022   Gait abnormality 01/29/2022   Status post total right knee replacement 01/18/2022   Coronary artery disease involving native coronary artery of native heart without angina pectoris 01/04/2022   Preop cardiovascular exam 01/04/2022   Primary osteoarthritis of right knee 01/04/2022   Acute right-sided low back pain without sciatica 12/25/2021   Aortic atherosclerosis ([place]) 09/04/2021   Spondylolisthesis at L4-L5 level 06/28/2021   S/P repair of ventral hernia 05/19/2021   S/P repair of recurrent ventral hernia 05/19/2021   Benign prostatic hyperplasia without lower urinary tract symptoms 10/14/2020   Chronic pain 10/14/2020   ED (erectile dysfunction) of organic origin 10/14/2020   Esophageal dysphagia 10/14/2020   Gastroesophageal reflux disease 10/14/2020   History of aortic valve replacement with bioprosthetic valve 10/14/2020   Hx of aortic aneurysm repair 10/14/2020   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  Erik Salazar reports still not sleeping well and had horrible trip up [place] due to traffic and weather, stuck in car 14 hours coming back.      PERTINENT HISTORY:  History chronic back pain, multiple back surgeries, bil  knee replacement (R knee on 01/18/2022), DVT, CVA frontal lobe with memory deficits, seizures, open heart surgery to repair aneurism, CABG x 2 , ACDF, hernia repair,omental flap graft to mediastinum  PAIN:  Are you having pain? Yes: NPRS scale: 01/24/09 Pain location: across low back  Pain description: shooting sharp pain, sometimes dull.  13/10 at night.  Aggravating factors: laying down, walking long distances, sitting >30 min  Relieving factors: changing positions constantly   PRECAUTIONS: None  WEIGHT BEARING RESTRICTIONS No  FALLS:  Has patient fallen in last 6 months? No  LIVING ENVIRONMENT: Lives with: lives with their spouse Lives in: House/apartment Stairs: No Has following equipment at home: Single point cane, Environmental consultant - 2 wheeled, and bed side commode  OCCUPATION: retired Curator  PLOF: Independent  walks daily 8k steps/day, also goes to gym  PATIENT GOALS decrease  low back pain, sleep better.    OBJECTIVE:   DIAGNOSTIC FINDINGS:  Lumbar spine 06/28/2021 IMPRESSION: Fluoroscopic assistance was provided for surgical fusion at L5-S1level.  PATIENT SURVEYS:  Modified Oswestry 20/50= 40% moderate disability    SCREENING FOR RED FLAGS: Bowel or bladder incontinence: No Spinal tumors: No Cauda equina syndrome: No Compression fracture: No Abdominal aneurysm: Yes: small, being monitored  COGNITION:  Overall cognitive status: History of cognitive impairments - at baseline for memory from CVA.      SENSATION: WFL  MUSCLE LENGTH: Hamstrings: Right 70 deg; Left 90 deg  POSTURE: decreased lumbar lordosis  PALPATION: Tenderness lumbar spine with PA mobs, L QL, L gluteus medius, R piriformis, R SIJ  LUMBAR ROM:   Active  AROM  eval  Flexion WNL **  Extension 75% limit **  Right lateral flexion To knee  Left lateral flexion To knee little pain  Right rotation WNL*  Left rotation WNL*   (Blank rows = not tested) *pain, **increased pain  LOWER EXTREMITY ROM:     Active  Right eval Left eval  Hip internal rotation 20 30  Hip external rotation 35 40  Knee flexion 120 130  Knee extension 5 0  Ankle dorsiflexion    Ankle plantarflexion     (Blank rows = not tested)  LOWER EXTREMITY MMT:  5/5 bil LE all myotomes.    LUMBAR SPECIAL TESTS:  Straight leg raise test: increased pain bilaterally, Slump test: Negative, and SI Compression/distraction test: Negative  FUNCTIONAL TESTS:  NT  GAIT: Distance walked: 68' Assistive device utilized: None Level of assistance: Complete Independence Comments: no significant deviation or device.  Walks 3-5,000 steps every morning.     TODAY'S TREATMENT  05/02/2022  Therapeutic Exercise: to improve strength and mobility.  Demo, verbal and tactile cues throughout for technique. Bike L4 x 8 min (2 miles) Hip openers x 10 bil  Core strengthening - passing 10lb weight around trunk x 10 each  direction Suitcase walks - f/b 2 x 40' with 10# weight, each side Manual Therapy: to decrease muscle spasm and pain and improve mobility.  In side lying with pillow between knees, STM/TPR to bil QL, glut med, piriformis   04/24/2022 Therapeutic Exercise: to improve strength and mobility.  Demo, verbal and tactile cues  throughout for technique. Nustep L6 x 5 min  Hip Openers at door x 15 bil  Leg extensions while planking on ball (on table) 2 x 15 bil  Paloff press 10# x 15 bil  Lunges x 15 bil Side lunge with foot on towel to encourage SLS x 15 bil  Back lunge with foot on towel to encourage SLS x 15 bil  QL stretch in doorway Manual Therapy: to decrease muscle spasm and pain and improve mobility IASTM with foam roller to bil glutes, proximal hamstrings, lumbar paraspinals, STM/TPR to bil QL, glut med, piriformis, MFR bil QL.    04/19/2022 Therapeutic Exercise: to improve strength and mobility.  Demo, verbal and tactile cues throughout for technique. Bike L4 x 7 min (2 miles) Hip openers at door 2 x 10 bil  Hip flexion/extension with hip hiked 2x 10 bil  Leg extensions leaning on ball 2 x 10 bil  Plank on ball (on table) 3 x 30 sec hold Hip circles seated on ball Paloff press seated on ball GTB x 20 bil  Lunges 2 x 10 bil - 1 UE support on counter.  Manual Therapy: to decrease muscle spasm and pain and improve mobility STM/TPR R piriformis/glut med; skilled palpation and monitoring during dry needling. Trigger Point Dry-Needling  Treatment instructions: Expect mild to moderate muscle soreness. S/S of pneumothorax if dry needled over a lung field, and to seek immediate medical attention should they occur. Patient verbalized understanding of these instructions and education.  Patient Consent Given: Yes Education handout provided: Previously provided Muscles treated: R piriformis Treatment response/outcome: Twitch Response Elicited and Palpable Increase in Muscle  Length       PATIENT EDUCATION:  Education details: continue to focus on core strengthening exercises.   Person educated: Patient Education method: Explanation, Demonstration, Verbal cues, and Handouts Education comprehension: verbalized understanding and returned demonstration   HOME EXERCISE PROGRAM: Access Code: [email or web address]  ASSESSMENT:  CLINICAL IMPRESSION: Erik Salazar still reports significant pain at night making him sleepless for hours. He did discuss with surgeon and has been scheduled for injections.   Today continued to focus on core strengthening followed by manual therapy with DTR to bil QL and piriformis, he reported decreased tightness and pain following.  Discussed dry needling, he would like to do this next appointment.  Erik Salazar continues to demonstrate potential for improvement and would benefit from continued skilled therapy to address impairments.      OBJECTIVE IMPAIRMENTS decreased activity tolerance, decreased mobility, decreased strength, increased fascial restrictions, impaired perceived functional ability, increased muscle spasms, improper body mechanics, postural dysfunction, and pain.   ACTIVITY LIMITATIONS carrying, lifting, bending, sitting, standing, sleeping, transfers, and bed mobility  PARTICIPATION LIMITATIONS: cleaning, laundry, shopping, community activity, and yard work  PERSONAL FACTORS Age, Time since onset of injury/illness/exacerbation, and 3+ comorbidities: recent R TKA, chronic LBP, abdominal surgeries, seizure disorder, memory impairments secondary to CVA  are also affecting patient's functional outcome.   REHAB POTENTIAL: Good  CLINICAL DECISION MAKING: Stable/uncomplicated  EVALUATION COMPLEXITY: Low   GOALS: Goals reviewed with patient? Yes  SHORT TERM GOALS: Target date: 04/17/2022   Patient will be independent with initial HEP.  Baseline: given Goal status: MET   LONG TERM GOALS: Target date: 05/15/2022    Patient  will be independent with advanced/ongoing HEP to improve outcomes and carryover.  Baseline: needs progression Goal status: IN PROGRESS  2.  Patient will report 75% improvement in low back pain to improve QOL.  Baseline: 8-10/10 LBP Goal  status: IN PROGRESS  3.  Patient will report 75% improvement in sleep disruption due to low back pain.  Baseline: sleepless nightly 1-2 hours due to pain Goal status: IN PROGRESS  4.  Patient will demonstrate full pain free lumbar ROM to perform ADLs.   Baseline: pain all directions Goal status: IN PROGRESS  5.  Patient will demonstrate improved functional strength as demonstrated by no stomach bulging with bed mobility. Baseline: core weaknes Goal status: IN PROGRESS  6.  Patient will report 15/50 or less on Oswestry to demonstrate improved functional ability.  Baseline: 20/50 = moderate disability  Goal status: IN PROGRESS   7.  Patient will tolerate 45 min of sitting without increased LBP for traveling, eating meals, etc. Baseline: increased pain after 15 min sitting Goal status: IN PROGRESS  8.  Patient will be able to transfer from floor to standing safely.  Baseline: unable Goal status: IN PROGRESS    PLAN: PT FREQUENCY: 2x/week  PT DURATION: 6 weeks  PLANNED INTERVENTIONS: Therapeutic exercises, Therapeutic activity, Neuromuscular re-education, Balance training, Gait training, Patient/Family education, Self Care, Joint mobilization, Stair training, Dry Needling, Electrical stimulation, Spinal mobilization, Moist heat, Taping, Traction, Ultrasound, Manual therapy, and Re-evaluation.  PLAN FOR NEXT SESSION: progress core strengthening exercises focusing on sitting/standing or supine. STM to decrease pain;  Does not tolerate prone positioning.  Manual therapy and modalities PRN.     Rennie Natter, PT, DPT  05/02/2022, 12:27 PM

## 2022-05-03 ENCOUNTER — Ambulatory Visit (INDEPENDENT_AMBULATORY_CARE_PROVIDER_SITE_OTHER): Payer: Medicare HMO | Admitting: Family

## 2022-05-03 ENCOUNTER — Encounter: Payer: Self-pay | Admitting: Family

## 2022-05-03 ENCOUNTER — Other Ambulatory Visit (HOSPITAL_BASED_OUTPATIENT_CLINIC_OR_DEPARTMENT_OTHER): Payer: Self-pay

## 2022-05-03 VITALS — BP 112/80 | HR 78 | Temp 97.9°F | Ht 70.0 in | Wt 192.0 lb

## 2022-05-03 DIAGNOSIS — G4700 Insomnia, unspecified: Secondary | ICD-10-CM | POA: Diagnosis not present

## 2022-05-03 MED ORDER — TRAZODONE HCL 50 MG PO TABS
25.0000 mg | ORAL_TABLET | Freq: Every evening | ORAL | 3 refills | Status: DC | PRN
  Filled 2022-05-03: qty 30, 30d supply, fill #0

## 2022-05-03 NOTE — Progress Notes (Signed)
Erik Salazar is a 71 y.o. male with the following history as recorded in EpicCare:  Patient Active Problem List   Diagnosis Date Noted   Postop check 03/26/2022   Gait abnormality 01/29/2022   Status post total right knee replacement 01/18/2022   Coronary artery disease involving native coronary artery of native heart without angina pectoris 01/04/2022   Preop cardiovascular exam 01/04/2022   Primary osteoarthritis of right knee 01/04/2022   Acute right-sided low back pain without sciatica 12/25/2021   Aortic atherosclerosis ([place]) 09/04/2021   Spondylolisthesis at L4-L5 level 06/28/2021   S/P repair of ventral hernia 05/19/2021   S/P repair of recurrent ventral hernia 05/19/2021   Benign prostatic hyperplasia without lower urinary tract symptoms 10/14/2020   Chronic pain 10/14/2020   ED (erectile dysfunction) of organic origin 10/14/2020   Esophageal dysphagia 10/14/2020   Gastroesophageal reflux disease 10/14/2020   History of aortic valve replacement with bioprosthetic valve 10/14/2020   Hx of aortic aneurysm repair 10/14/2020   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   Thoracic aortic aneurysm without rupture ([place]) 10/14/2020   Recurrent incisional hernia 10/14/2020   Spinal stenosis in cervical region 05/04/2020   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   Cervical radiculopathy at C6 11/14/2017   Carpal tunnel syndrome on right 07/08/2017   Tremor, essential 07/08/2017   Ischemic stroke of frontal lobe ([place]) 04/05/2017   Pain in right hand 04/05/2017   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 03/27/2017   Seizure ([place]) 03/14/2017   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/13/2017   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     Cholecalciferol (VITAMIN D3) 50 MCG (2000 UT)  TABS Take 2,000 Units by mouth daily.     inclisiran (LEQVIO) 284 MG/1.5ML SOSY injection Inject 1.94m at 0 months, 3 months and then every 6 months thereafter. 1.5 mL 0   levETIRAcetam (KEPPRA) 1000 MG tablet Take 1 tablet (1,000 mg total) by mouth 2 (two) times daily. 180 tablet 4   metoprolol succinate (TOPROL-XL) 25 MG 24 hr tablet Take 1/2 tablet (12.5 mg total) by mouth daily. With or immediately following a meal. 45 tablet 3   pravastatin (PRAVACHOL) 40 MG tablet Take 1 tablet (40 mg total) by mouth every evening. 90 tablet 3   pyridOXINE (VITAMIN B-6) 100 MG tablet Take 100 mg by mouth daily.     sildenafil (REVATIO) 20 MG tablet Take 2 -3 tablets by mouth once daily as needed 30 tablet 1   tamsulosin (FLOMAX) 0.4 MG CAPS capsule Take 2 capsules by mouth every day 180 capsule 1   traZODone (DESYREL) 50 MG tablet Take 0.5-1 tablets (25-50 mg total) by mouth at bedtime as needed for sleep. 30 tablet 3   No current facility-administered medications for this visit.    Allergies: Oxycodone hcl, Zetia [ezetimibe], Augmentin [amoxicillin-pot clavulanate], Chlorhexidine, and Elemental sulfur  Past Medical History:  Diagnosis Date   Arthritis    RA and OA   Carpal tunnel syndrome on right 07/08/2017   Cervical radiculopathy at C6 095/62/1308  Right   Complication of anesthesia    Coronary artery disease    Enlarged prostate    Fibromyalgia    H/O: knee surgery    15    History of blood clots    History of  open heart surgery    ASCENDING AORTIC ANEURYSM   Ischemic stroke of frontal lobe ([place]) 03/14/2017   Bilateral; post-redo CT surgery   Pneumonia    PONV (postoperative nausea and vomiting)    only after CABG surgeries   Seizure disorder ([place]) 03/14/2017   Seizures ([place])    Status post knee surgery    DVT POST KNEE SURGERY   Stroke [facility])     Past Surgical History:  Procedure Laterality Date   ANTERIOR CERVICAL DECOMP/DISCECTOMY FUSION N/A 05/04/2020   Procedure: Anterior  Cervical Decompression/Discectomy Fuion Cervical three-four, Cervical four-five, Cervical five-six;  Surgeon: Kary Kos, MD;  Location: [place];  Service: Neurosurgery;  Laterality: N/A;   BALLOON DILATION N/A 06/23/2019   Procedure: BALLOON DILATION;  Surgeon: Otis Brace, MD;  [facility];  Service: Gastroenterology;  Laterality: N/A;   BENTALL PROCEDURE  01/04/2016   Bentall with 23 mm pericardial AVR; SVG-LAD, SVG-CX ([place])   BICEPS TENDON REPAIR Right    BIOPSY  06/23/2019   Procedure: BIOPSY;  Surgeon: Otis Brace, MD;  [facility];  Service: Gastroenterology;;   CARDIAC SURGERY     ANUERSYM MAY 2017   Bentall procedure. Bioprosthetic aortic valve #23 mm bovine model #2700 TF ask, and 28 mm Gelweave woven vascular sinus of Valsalva graft   CARPAL TUNNEL RELEASE  08/2018   right hand    CORONARY ARTERY BYPASS GRAFT  01/04/2016   VG to LAD & VG to LCX   CORONARY ARTERY BYPASS GRAFT  03/13/2017   LIMA to LAD with steril abcess removal from dacron graft   ESOPHAGOGASTRODUODENOSCOPY     Had done dilatation done about 2 or 3 times before in [place]   ESOPHAGOGASTRODUODENOSCOPY (EGD) WITH PROPOFOL N/A 06/23/2019   Procedure: ESOPHAGOGASTRODUODENOSCOPY (EGD) WITH PROPOFOL;  Surgeon: Otis Brace, MD;  [facility];  Service: Gastroenterology;  Laterality: N/A;   FALSE ANEURYSM REPAIR  03/13/2017   redo sternotomy, sterile abscess removal from Dacron graft, omental flap around aorta, CABG: LIMA-LAD ([name], Dr. Mart Piggs)   [place]  2018   Of pericardium 2018   INSERTION OF MESH  10/14/2020   Procedure: INSERTION OF MESH;  Surgeon: Michael Boston, MD;  Location: [place];  Service: General;;   knee surgeries     13 surgeries on knee done before knee replacement    [place] N/A 05/19/2021   Procedure: LYSIS OF ADHESIONS;  Surgeon: Michael Boston, MD;  Location:  [place];  Service: General;  Laterality: N/A;  GEN & LOCAL   LYSIS OF ADHESION N/A 10/14/2020   Procedure: LYSIS OF ADHESION;  Surgeon: Michael Boston, MD;  Location: [place];  Service: General;  Laterality: N/A;   open heart surgery     03-13-2017   REPLACEMENT TOTAL KNEE Left 2015   ROTATOR CUFF REPAIR Right    done with biceps tendon repair   TONSILLECTOMY     removed as a child.   TOTAL KNEE ARTHROPLASTY Right 01/18/2022   Procedure: RIGHT TOTAL KNEE ARTHROPLASTY;  Surgeon: Leandrew Koyanagi, MD;  Location: [place];  Service: Orthopedics;  Laterality: Right;   VENTRAL HERNIA REPAIR N/A 10/14/2020   Procedure: LAPAROSCOPIC VENTRAL HERNIA REPAIR WITH TAP BLOCK BILATERAL;  Surgeon: Michael Boston, MD;  Location: [place];  Service: General;  Laterality: N/A;   VENTRAL HERNIA REPAIR N/A 05/19/2021   Procedure: LAPAROSCOPIC VENTRAL WALL HERNIA REPAIR;  Surgeon: Michael Boston, MD;  [facility];  Service: General;  Laterality: N/A;    Family History  Problem Relation Age of Onset   Arthritis Mother    Aneurysm Mother        brain aneurysm for mother.    Arthritis Father    Colon cancer Neg Hx    Esophageal cancer Neg Hx     Social History   Tobacco Use   Smoking status: Former    Types: Cigars    Quit date: 2021    Years since quitting: 2.7   Smokeless tobacco: Never   Tobacco comments:    Only smoked cigars for 6 months  Substance Use Topics   Alcohol use: Yes    Alcohol/week: 1.0 standard drink of alcohol    Types: 1 Cans of beer per week    Comment: occassionally    Subjective:   Patient accompanied by wife; concerns for worsening insomnia over the past few months; is only able to sleep for about 3 hours at a time; is waking up with pain in both hips- planning to have cortisone injections in hip at the end of the month;  No concerns for apnea- is not a snorer; does not wake up feeling that he cannot breathe- feels that pain is causing him to wake up;      Objective:  Vitals:    05/03/22 1304  BP: 112/80  Pulse: 78  Temp: 97.9 F (36.6 C)  TempSrc: Oral  SpO2: 97%  Weight: 192 lb (87.1 kg)  Height: '5\' 10"'$  (1.778 m)    General: Well developed, well nourished, in no acute distress  Skin : Warm and dry.  Head: Normocephalic and atraumatic  Lungs: Respirations unlabored;  Neurologic: Alert and oriented; speech intact; face symmetrical; moves all extremities well; CNII-XII intact without focal deficit   Assessment:  1. Insomnia, unspecified type     Plan:  Trial of Trazodone 50 mg- take 1/2 tablet at night for initial trial; discussed that can increase to 50 mg or 100 mg; follow up to be determined;  High dose flu shot given as requested.   No follow-ups on file.  No orders of the defined types were placed in this encounter.   Requested Prescriptions   Signed Prescriptions Disp Refills   traZODone (DESYREL) 50 MG tablet 30 tablet 3    Sig: Take 0.5-1 tablets (25-50 mg total) by mouth at bedtime as needed for sleep.

## 2022-05-04 ENCOUNTER — Encounter: Payer: Self-pay | Admitting: Physical Therapy

## 2022-05-04 ENCOUNTER — Ambulatory Visit: Payer: Medicare HMO | Admitting: Physical Therapy

## 2022-05-04 DIAGNOSIS — M6281 Muscle weakness (generalized): Secondary | ICD-10-CM

## 2022-05-04 DIAGNOSIS — M5442 Lumbago with sciatica, left side: Secondary | ICD-10-CM | POA: Diagnosis not present

## 2022-05-04 DIAGNOSIS — R252 Cramp and spasm: Secondary | ICD-10-CM

## 2022-05-04 DIAGNOSIS — G8929 Other chronic pain: Secondary | ICD-10-CM

## 2022-05-04 NOTE — Therapy (Signed)
OUTPATIENT PHYSICAL THERAPY TREATMENT   Patient Name: Erik Salazar MRN: [id number] DOB:1950/11/17, 71 y.o., male Today's Date: 05/04/2022   PT End of Session - 05/04/22 0804     Visit Number 9    Number of Visits 12    Date for PT Re-Evaluation 05/15/22    Authorization Type [employer]    Progress Note Due on Visit 10    PT Start Time 0801    PT Stop Time 0849    PT Time Calculation (min) 48 min    Activity Tolerance Patient tolerated treatment well    Behavior During Therapy [facility] for tasks assessed/performed                Past Medical History:  Diagnosis Date   Arthritis    RA and OA   Carpal tunnel syndrome on right 07/08/2017   Cervical radiculopathy at C6 61/95/0932   Right   Complication of anesthesia    Coronary artery disease    Enlarged prostate    Fibromyalgia    H/O: knee surgery    15    History of blood clots    History of open heart surgery    ASCENDING AORTIC ANEURYSM   Ischemic stroke of frontal lobe ([place]) 03/14/2017   Bilateral; post-redo CT surgery   Pneumonia    PONV (postoperative nausea and vomiting)    only after CABG surgeries   Seizure disorder ([place]) 03/14/2017   Seizures ([place])    Status post knee surgery    DVT POST KNEE SURGERY   Stroke [facility])    Past Surgical History:  Procedure Laterality Date   ANTERIOR CERVICAL DECOMP/DISCECTOMY FUSION N/A 05/04/2020   Procedure: Anterior Cervical Decompression/Discectomy Fuion Cervical three-four, Cervical four-five, Cervical five-six;  Surgeon: Kary Kos, MD;  Location: [place];  Service: Neurosurgery;  Laterality: N/A;   BALLOON DILATION N/A 06/23/2019   Procedure: BALLOON DILATION;  Surgeon: Otis Brace, MD;  Location: [facility];  Service: Gastroenterology;  Laterality: N/A;   BENTALL PROCEDURE  01/04/2016   Bentall with 23 mm pericardial AVR; SVG-LAD, SVG-CX ([place])   BICEPS TENDON REPAIR Right    BIOPSY  06/23/2019   Procedure: BIOPSY;  Surgeon:  Otis Brace, MD;  Location: [facility];  Service: Gastroenterology;;   CARDIAC SURGERY     ANUERSYM MAY 2017   Bentall procedure. Bioprosthetic aortic valve #23 mm bovine model #2700 TF ask, and 28 mm Gelweave woven vascular sinus of Valsalva graft   CARPAL TUNNEL RELEASE  08/2018   right hand    CORONARY ARTERY BYPASS GRAFT  01/04/2016   VG to LAD & VG to LCX   CORONARY ARTERY BYPASS GRAFT  03/13/2017   LIMA to LAD with steril abcess removal from dacron graft   ESOPHAGOGASTRODUODENOSCOPY     Had done dilatation done about 2 or 3 times before in [place]   ESOPHAGOGASTRODUODENOSCOPY (EGD) WITH PROPOFOL N/A 06/23/2019   Procedure: ESOPHAGOGASTRODUODENOSCOPY (EGD) WITH PROPOFOL;  Surgeon: Otis Brace, MD;  [facility];  Service: Gastroenterology;  Laterality: N/A;   FALSE ANEURYSM REPAIR  03/13/2017   redo sternotomy, sterile abscess removal from Dacron graft, omental flap around aorta, CABG: LIMA-LAD ([name], Dr. Mart Piggs)   [place]  2018   Of pericardium 2018   INSERTION OF MESH  10/14/2020   Procedure: INSERTION OF MESH;  Surgeon: Michael Boston, MD;  Location: [place] [facility];  Service: General;;   knee surgeries     13 surgeries on knee done  before knee replacement    KNEE SURGERY  1983   LAPAROSCOPIC LYSIS OF ADHESIONS N/A 05/19/2021   Procedure: LYSIS OF ADHESIONS;  Surgeon: Michael Boston, MD;  Location: [place];  Service: General;  Laterality: N/A;  GEN & LOCAL   LYSIS OF ADHESION N/A 10/14/2020   Procedure: LYSIS OF ADHESION;  Surgeon: Michael Boston, MD;  Location: [place];  Service: General;  Laterality: N/A;   open heart surgery     03-13-2017   REPLACEMENT TOTAL KNEE Left 2015   ROTATOR CUFF REPAIR Right    done with biceps tendon repair   TONSILLECTOMY     removed as a child.   TOTAL KNEE ARTHROPLASTY Right 01/18/2022   Procedure: RIGHT TOTAL KNEE ARTHROPLASTY;  Surgeon: Leandrew Koyanagi, MD;  Location: [place];  Service: Orthopedics;  Laterality:  Right;   VENTRAL HERNIA REPAIR N/A 10/14/2020   Procedure: LAPAROSCOPIC VENTRAL HERNIA REPAIR WITH TAP BLOCK BILATERAL;  Surgeon: Michael Boston, MD;  Location: [place];  Service: General;  Laterality: N/A;   VENTRAL HERNIA REPAIR N/A 05/19/2021   Procedure: LAPAROSCOPIC VENTRAL WALL HERNIA REPAIR;  Surgeon: Michael Boston, MD;  Location: [place];  Service: General;  Laterality: N/A;   Patient Active Problem List   Diagnosis Date Noted   Postop check 03/26/2022   Gait abnormality 01/29/2022   Status post total right knee replacement 01/18/2022   Coronary artery disease involving native coronary artery of native heart without angina pectoris 01/04/2022   Preop cardiovascular exam 01/04/2022   Primary osteoarthritis of right knee 01/04/2022   Acute right-sided low back pain without sciatica 12/25/2021   Aortic atherosclerosis ([place]) 09/04/2021   Spondylolisthesis at L4-L5 level 06/28/2021   S/P repair of ventral hernia 05/19/2021   S/P repair of recurrent ventral hernia 05/19/2021   Benign prostatic hyperplasia without lower urinary tract symptoms 10/14/2020   Chronic pain 10/14/2020   ED (erectile dysfunction) of organic origin 10/14/2020   Esophageal dysphagia 10/14/2020   Gastroesophageal reflux disease 10/14/2020   History of aortic valve replacement with bioprosthetic valve 10/14/2020   Hx of aortic aneurysm repair 10/14/2020   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    PERTINENT HISTORY:  History chronic back pain, multiple back surgeries, bil  knee replacement (R knee on 01/18/2022), DVT, CVA frontal lobe with memory deficits, seizures, open heart surgery to repair aneurism, CABG x 2 , ACDF, hernia repair,omental flap graft to mediastinum  PAIN:  Are you having pain? Yes: NPRS scale: 5/10 Pain location: across low back  Pain description: shooting sharp pain, sometimes dull.  13/10 at night.  Aggravating factors: laying down, walking long distances, sitting >30 min  Relieving factors: changing positions constantly   PRECAUTIONS: None  WEIGHT BEARING RESTRICTIONS No  FALLS:  Has patient fallen in last 6 months? No  LIVING ENVIRONMENT: Lives with: lives with their spouse Lives in: House/apartment Stairs: No Has following equipment at home: Single point cane, Environmental consultant - 2 wheeled, and bed side commode  OCCUPATION: retired Curator  PLOF: Independent  walks daily 8k steps/day, also goes to gym  PATIENT GOALS decrease low back pain, sleep better.    OBJECTIVE:   DIAGNOSTIC FINDINGS:  Lumbar spine 06/28/2021 IMPRESSION: Fluoroscopic  assistance was provided for surgical fusion at L5-S1level.  PATIENT SURVEYS:  Modified Oswestry 20/50= 40% moderate disability    SCREENING FOR RED FLAGS: Bowel or bladder incontinence: No Spinal tumors: No Cauda equina syndrome: No Compression fracture: No Abdominal aneurysm: Yes: small, being monitored  COGNITION:  Overall cognitive status: History of cognitive impairments - at baseline for memory from CVA.      SENSATION: WFL  MUSCLE LENGTH: Hamstrings: Right 70 deg; Left 90 deg  POSTURE: decreased lumbar lordosis  PALPATION: Tenderness lumbar spine with PA mobs, L QL, L gluteus medius, R piriformis, R SIJ  LUMBAR ROM:   Active  AROM  eval  Flexion WNL **  Extension 75% limit **  Right lateral flexion To knee  Left lateral flexion To knee little pain  Right rotation WNL*  Left rotation WNL*   (Blank rows = not tested) *pain, **increased pain  LOWER EXTREMITY ROM:     Active  Right eval Left eval  Hip internal rotation 20 30  Hip external rotation 35 40  Knee flexion 120 130  Knee extension 5 0  Ankle dorsiflexion    Ankle plantarflexion     (Blank rows = not tested)  LOWER EXTREMITY MMT:  5/5 bil LE all myotomes.    LUMBAR SPECIAL TESTS:  Straight leg raise test: increased pain bilaterally, Slump test: Negative, and SI Compression/distraction test: Negative  FUNCTIONAL TESTS:  NT  GAIT: Distance walked: 57' Assistive device utilized: None Level of assistance: Complete Independence Comments: no significant deviation or device.  Walks 3-5,000 steps every morning.     TODAY'S TREATMENT  05/04/2022 Therapeutic Exercise: to improve strength and mobility.  L4 x 8 min  Paloff press 5# 2 x 10 bil  Manual Therapy: to decrease muscle spasm and pain and improve mobility.  Deep tissue/trigger point release bil QL, glut med, piriformis; skilled palpation and monitoring during dry needling. Trigger Point Dry-Needling  Treatment instructions: Expect mild  to moderate muscle soreness. S/S of pneumothorax if dry needled over a lung field, and to seek immediate medical attention should they occur. Patient verbalized understanding of these instructions and education.  Patient Consent Given: Yes Education handout provided: Previously provided Muscles treated: bil glut med and piriformis Treatment response/outcome: Twitch Response Elicited and Palpable Increase in Muscle Length  05/02/2022  Therapeutic Exercise: to improve strength and mobility.  Demo, verbal and tactile cues throughout for technique. Bike L4 x 8 min (2 miles) Hip openers x 10 bil  Core strengthening - passing 10lb weight around trunk x 10 each direction Suitcase walks - f/b 2 x 40' with 10# weight, each side Manual Therapy: to decrease muscle spasm and pain and improve mobility.  In side lying with pillow between knees, STM/TPR to bil QL, glut med, piriformis   04/24/2022 Therapeutic Exercise: to improve strength and mobility.  Demo, verbal and tactile cues throughout for technique. Nustep L6 x 5 min  Hip Openers at door x 15 bil  Leg extensions while planking on ball (on table) 2 x 15 bil  Paloff press 10# x 15 bil  Lunges x 15 bil Side lunge with foot on towel to encourage SLS x 15 bil  Back lunge with foot on towel to encourage SLS x 15 bil  QL stretch in doorway Manual Therapy: to decrease muscle spasm and pain and improve mobility IASTM with foam roller to bil glutes, proximal hamstrings, lumbar paraspinals, STM/TPR to bil QL, glut med, piriformis, MFR bil QL.    PATIENT EDUCATION:  Education details: try placing pillow under side at night to improve comfort.  Person educated: Patient Education method: Customer service manager Education comprehension: verbalized understanding   HOME EXERCISE PROGRAM: Access Code: [email or web address]  ASSESSMENT:  CLINICAL IMPRESSION: Khyan Shakir still reports significant pain at night making him sleepless for hours. Disappointed new  medication did not help sleep last night, explained that it may take 2 weeks to become effective.  Focused interventions today on manual therapy including deep tissue massage and TrDN to bil QL,  glut med and piriformis, which he tolerated well, reported decreased tightness and pain following interventions.   Li Boshers continues to demonstrate potential for improvement and would benefit from continued skilled therapy to address impairments.      OBJECTIVE IMPAIRMENTS decreased activity tolerance, decreased mobility, decreased strength, increased fascial restrictions, impaired perceived functional ability, increased muscle spasms, improper body mechanics, postural dysfunction, and pain.   ACTIVITY LIMITATIONS carrying, lifting, bending, sitting, standing, sleeping, transfers, and bed mobility  PARTICIPATION LIMITATIONS: cleaning, laundry, shopping, community activity, and yard work  PERSONAL FACTORS Age, Time since onset of injury/illness/exacerbation, and 3+ comorbidities: recent R TKA, chronic LBP, abdominal surgeries, seizure disorder, memory impairments secondary to CVA  are also affecting patient's functional outcome.   REHAB POTENTIAL: Good  CLINICAL DECISION MAKING: Stable/uncomplicated  EVALUATION COMPLEXITY: Low   GOALS: Goals reviewed with patient? Yes  SHORT TERM GOALS: Target date: 04/17/2022   Patient will be independent with initial HEP.  Baseline: given Goal status: MET   LONG TERM GOALS: Target date: 05/15/2022    Patient will be independent with advanced/ongoing HEP to improve outcomes and carryover.  Baseline: needs progression Goal status: IN PROGRESS  2.  Patient will report 75% improvement in low back pain to improve QOL.  Baseline: 8-10/10 LBP Goal status: IN PROGRESS  3.  Patient will report 75% improvement in sleep disruption due to low back pain.  Baseline: sleepless nightly 1-2 hours due to pain Goal status: IN PROGRESS  4.  Patient will  demonstrate full pain free lumbar ROM to perform ADLs.   Baseline: pain all directions Goal status: IN PROGRESS  5.  Patient will demonstrate improved functional strength as demonstrated by no stomach bulging with bed mobility. Baseline: core weaknes Goal status: IN PROGRESS  6.  Patient will  report 15/50 or less on Oswestry to demonstrate improved functional ability.  Baseline: 20/50 = moderate disability  Goal status: IN PROGRESS   7.  Patient will tolerate 45 min of sitting without increased LBP for traveling, eating meals, etc. Baseline: increased pain after 15 min sitting Goal status: IN PROGRESS  8.  Patient will be able to transfer from floor to standing safely.  Baseline: unable Goal status: IN PROGRESS    PLAN: PT FREQUENCY: 2x/week  PT DURATION: 6 weeks  PLANNED INTERVENTIONS: Therapeutic exercises, Therapeutic activity, Neuromuscular re-education, Balance training, Gait training, Patient/Family education, Self Care, Joint mobilization, Stair training, Dry Needling, Electrical stimulation, Spinal mobilization, Moist heat, Taping, Traction, Ultrasound, Manual therapy, and Re-evaluation.  PLAN FOR NEXT SESSION: progress core strengthening exercises focusing on sitting/standing or supine. STM to decrease pain;  Does not tolerate prone positioning.  Manual therapy and modalities PRN.     Rennie Natter, PT, DPT  05/04/2022, 9:01 AM

## 2022-05-07 ENCOUNTER — Ambulatory Visit (INDEPENDENT_AMBULATORY_CARE_PROVIDER_SITE_OTHER): Payer: Medicare HMO | Admitting: Medical

## 2022-05-07 ENCOUNTER — Other Ambulatory Visit (HOSPITAL_BASED_OUTPATIENT_CLINIC_OR_DEPARTMENT_OTHER): Payer: Self-pay

## 2022-05-07 VITALS — BP 139/68 | HR 79 | Temp 97.7°F | Resp 18 | Ht 70.0 in | Wt 191.6 lb

## 2022-05-07 DIAGNOSIS — R350 Frequency of micturition: Secondary | ICD-10-CM

## 2022-05-07 DIAGNOSIS — R739 Hyperglycemia, unspecified: Secondary | ICD-10-CM | POA: Diagnosis not present

## 2022-05-07 LAB — POCT URINALYSIS DIPSTICK
Bilirubin, UA: NEGATIVE
Blood, UA: NEGATIVE
Glucose, UA: NEGATIVE
Ketones, UA: NEGATIVE
Leukocytes, UA: NEGATIVE
Nitrite, UA: NEGATIVE
Protein, UA: NEGATIVE
Spec Grav, UA: 1.01 (ref 1.010–1.025)
Urobilinogen, UA: 0.2 E.U./dL
pH, UA: 5 (ref 5.0–8.0)

## 2022-05-07 LAB — COMPREHENSIVE METABOLIC PANEL
ALT: 16 U/L (ref 0–53)
AST: 17 U/L (ref 0–37)
Albumin: 4.2 g/dL (ref 3.5–5.2)
Alkaline Phosphatase: 97 U/L (ref 39–117)
BUN: 13 mg/dL (ref 6–23)
CO2: 27 mEq/L (ref 19–32)
Calcium: 9.5 mg/dL (ref 8.4–10.5)
Chloride: 97 mEq/L (ref 96–112)
Creatinine, Ser: 1.08 mg/dL (ref 0.40–1.50)
GFR: 69.28 mL/min (ref >60.00)
Glucose, Bld: 93 mg/dL (ref 70–99)
Potassium: 4.4 mEq/L (ref 3.5–5.1)
Sodium: 133 mEq/L — ABNORMAL LOW (ref 135–145)
Total Bilirubin: 0.8 mg/dL (ref 0.2–1.2)
Total Protein: 7.1 g/dL (ref 6.0–8.3)

## 2022-05-07 LAB — CBC WITH DIFFERENTIAL/PLATELET
Basophils Absolute: 0 10*3/uL (ref 0.0–0.1)
Basophils Relative: 0.6 % (ref 0.0–3.0)
Eosinophils Absolute: 0.2 10*3/uL (ref 0.0–0.7)
Eosinophils Relative: 2 % (ref 0.0–5.0)
HCT: 44.3 % (ref 39.0–52.0)
Hemoglobin: 14.5 g/dL (ref 13.0–17.0)
Lymphocytes Relative: 20.5 % (ref 12.0–46.0)
Lymphs Abs: 1.7 10*3/uL (ref 0.7–4.0)
MCHC: 32.7 g/dL (ref 30.0–36.0)
MCV: 82.5 fl (ref 78.0–100.0)
Monocytes Absolute: 0.8 10*3/uL (ref 0.1–1.0)
Monocytes Relative: 9.2 % (ref 3.0–12.0)
Neutro Abs: 5.6 10*3/uL (ref 1.4–7.7)
Neutrophils Relative %: 67.7 % (ref 43.0–77.0)
Platelets: 258 10*3/uL (ref 150.0–400.0)
RBC: 5.37 Mil/uL (ref 4.22–5.81)
RDW: 14.4 % (ref 11.5–15.5)
WBC: 8.2 10*3/uL (ref 4.0–10.5)

## 2022-05-07 LAB — PSA: PSA: 0.17 ng/mL (ref 0.10–4.00)

## 2022-05-07 LAB — HEMOGLOBIN A1C: Hgb A1c MFr Bld: 6.2 % (ref 4.6–6.5)

## 2022-05-07 MED ORDER — CIPROFLOXACIN HCL 500 MG PO TABS
500.0000 mg | ORAL_TABLET | Freq: Two times a day (BID) | ORAL | 0 refills | Status: DC
  Filled 2022-05-07: qty 14, 7d supply, fill #0

## 2022-05-07 NOTE — Progress Notes (Signed)
Subjective:    Patient ID: Erik Salazar, male    DOB: 1951-07-29, 71 y.o.   MRN: [id number]  HPI  Pt in for frequent urination beginning of last week went away. Then this weekend symptoms reoccured. He states getting up 3 times at night and getting up during the day as well. No fever, no chills or sweats. No perineum pain.     Hx of bph in past on flomax. Just gets up once at night.   Review of Systems  Constitutional:  Negative for chills, fatigue and fever.  HENT:  Negative for congestion and drooling.   Respiratory:  Negative for cough, chest tightness, shortness of breath and wheezing.   Cardiovascular:  Negative for chest pain and palpitations.  Gastrointestinal:  Negative for abdominal pain, diarrhea, nausea and vomiting.  Genitourinary:  Positive for frequency. Negative for difficulty urinating, dysuria, penile discharge, scrotal swelling and testicular pain.  Musculoskeletal:  Negative for back pain.  Skin:  Negative for rash.  Hematological:  Negative for adenopathy. Does not bruise/bleed easily.  Psychiatric/Behavioral:  Negative for confusion and decreased concentration. The patient is not nervous/anxious.     Past Medical History:  Diagnosis Date   Arthritis    RA and OA   Carpal tunnel syndrome on right 07/08/2017   Cervical radiculopathy at C6 40/98/1191   Right   Complication of anesthesia    Coronary artery disease    Enlarged prostate    Fibromyalgia    H/O: knee surgery    15    History of blood clots    History of open heart surgery    ASCENDING AORTIC ANEURYSM   Ischemic stroke of frontal lobe ([place]) 03/14/2017   Bilateral; post-redo CT surgery   Pneumonia    PONV (postoperative nausea and vomiting)    only after CABG surgeries   Seizure disorder ([place]) 03/14/2017   Seizures ([place])    Status post knee surgery    DVT POST KNEE SURGERY   Stroke [facility])      Social History   Socioeconomic History   Marital status: Married    Spouse name: Not on  file   Number of children: 3   Years of education: Not on file   Highest education level: Not on file  Occupational History   Occupation: RETIRED  Tobacco Use   Smoking status: Former    Types: Cigars    Quit date: 2021    Years since quitting: 2.7   Smokeless tobacco: Never   Tobacco comments:    Only smoked cigars for 6 months  Vaping Use   Vaping Use: Never used  Substance and Sexual Activity   Alcohol use: Yes    Alcohol/week: 1.0 standard drink of alcohol    Types: 1 Cans of beer per week    Comment: occassionally   Drug use: No   Sexual activity: Not on file  Other Topics Concern   Not on file  Social History Narrative   Lives at home with wife, is retired.  Education: 2 yrs college.   2 Children.   Social Determinants of Health   Financial Resource Strain: Low Risk  (12/21/2021)   Overall Financial Resource Strain (CARDIA)    Difficulty of Paying Living Expenses: Not hard at all  Food Insecurity: No Food Insecurity (12/21/2021)   Hunger Vital Sign    Worried About Running Out of Food in the Last Year: Never true    Ran Out of Food in the Last Year: Never  true  Transportation Needs: No Transportation Needs (12/21/2021)   PRAPARE - Hydrologist (Medical): No    Lack of Transportation (Non-Medical): No  Physical Activity: Sufficiently Active (12/21/2021)   Exercise Vital Sign    Days of Exercise per Week: 7 days    Minutes of Exercise per Session: 60 min  Stress: No Stress Concern Present (12/21/2021)   [place]    Feeling of Stress : Not at all  Social Connections: [place] (12/21/2021)   Social Connection and Isolation Panel [NHANES]    Frequency of Communication with Friends and Family: More than three times a week    Frequency of Social Gatherings with Friends and Family: More than three times a week    Attends Religious Services: More than 4 times per year     Active Member of [employer] or Organizations: Yes    Attends Archivist Meetings: More than 4 times per year    Marital Status: Married  Human resources officer Violence: Not At Risk (12/21/2021)   Humiliation, Afraid, Rape, and Kick questionnaire    Fear of Current or Ex-Partner: No    Emotionally Abused: No    Physically Abused: No    Sexually Abused: No    Past Surgical History:  Procedure Laterality Date   ANTERIOR CERVICAL DECOMP/DISCECTOMY FUSION N/A 05/04/2020   Procedure: Anterior Cervical Decompression/Discectomy Fuion Cervical three-four, Cervical four-five, Cervical five-six;  Surgeon: Kary Kos, MD;  Location: [place];  Service: Neurosurgery;  Laterality: N/A;   BALLOON DILATION N/A 06/23/2019   Procedure: BALLOON DILATION;  Surgeon: Otis Brace, MD;  Location: [facility];  Service: Gastroenterology;  Laterality: N/A;   BENTALL PROCEDURE  01/04/2016   Bentall with 23 mm pericardial AVR; SVG-LAD, SVG-CX ([place])   BICEPS TENDON REPAIR Right    BIOPSY  06/23/2019   Procedure: BIOPSY;  Surgeon: Otis Brace, MD;  Location: [facility];  Service: Gastroenterology;;   CARDIAC SURGERY     ANUERSYM MAY 2017   Bentall procedure. Bioprosthetic aortic valve #23 mm bovine model #2700 TF ask, and 28 mm Gelweave woven vascular sinus of Valsalva graft   CARPAL TUNNEL RELEASE  08/2018   right hand    CORONARY ARTERY BYPASS GRAFT  01/04/2016   VG to LAD & VG to LCX   CORONARY ARTERY BYPASS GRAFT  03/13/2017   LIMA to LAD with steril abcess removal from dacron graft   ESOPHAGOGASTRODUODENOSCOPY     Had done dilatation done about 2 or 3 times before in [place]   ESOPHAGOGASTRODUODENOSCOPY (EGD) WITH PROPOFOL N/A 06/23/2019   Procedure: ESOPHAGOGASTRODUODENOSCOPY (EGD) WITH PROPOFOL;  Surgeon: Otis Brace, MD;  Location: [facility];  Service: Gastroenterology;  Laterality: N/A;   FALSE ANEURYSM REPAIR  03/13/2017   redo sternotomy, sterile abscess  removal from Dacron graft, omental flap around aorta, CABG: LIMA-LAD ([name], Dr. Mart Piggs)   [place]  2018   Of pericardium 2018   INSERTION OF MESH  10/14/2020   Procedure: INSERTION OF MESH;  Surgeon: Michael Boston, MD;  Location: [place];  Service: General;;   knee surgeries     13 surgeries on knee done before knee replacement    [place] N/A 05/19/2021   Procedure: LYSIS OF ADHESIONS;  Surgeon: Michael Boston, MD;  Location: [place];  Service: General;  Laterality: N/A;  GEN & LOCAL   LYSIS OF  ADHESION N/A 10/14/2020   Procedure: LYSIS OF ADHESION;  Surgeon: Michael Boston, MD;  Location: [place];  Service: General;  Laterality: N/A;   open heart surgery     03-13-2017   REPLACEMENT TOTAL KNEE Left 2015   ROTATOR CUFF REPAIR Right    done with biceps tendon repair   TONSILLECTOMY     removed as a child.   TOTAL KNEE ARTHROPLASTY Right 01/18/2022   Procedure: RIGHT TOTAL KNEE ARTHROPLASTY;  Surgeon: Leandrew Koyanagi, MD;  Location: [place];  Service: Orthopedics;  Laterality: Right;   VENTRAL HERNIA REPAIR N/A 10/14/2020   Procedure: LAPAROSCOPIC VENTRAL HERNIA REPAIR WITH TAP BLOCK BILATERAL;  Surgeon: Michael Boston, MD;  Location: [place];  Service: General;  Laterality: N/A;   VENTRAL HERNIA REPAIR N/A 05/19/2021   Procedure: LAPAROSCOPIC VENTRAL WALL HERNIA REPAIR;  Surgeon: Michael Boston, MD;  Location: [place];  Service: General;  Laterality: N/A;    Family History  Problem Relation Age of Onset   Arthritis Mother    Aneurysm Mother        brain aneurysm for mother.    Arthritis Father    Colon cancer Neg Hx    Esophageal cancer Neg Hx     Allergies  Allergen Reactions   Oxycodone Hcl Other (See Comments)    DELUSIONAL   Zetia [Ezetimibe] Other (See Comments)    Leg cramps   Augmentin [Amoxicillin-Pot Clavulanate] Nausea Only    Dizzy   Chlorhexidine Rash   Elemental Sulfur Rash    Current Outpatient  Medications on File Prior to Visit  Medication Sig Dispense Refill   ascorbic acid (VITAMIN C) 500 MG tablet Take 500 mg by mouth daily.     Cholecalciferol (VITAMIN D3) 50 MCG (2000 UT) TABS Take 2,000 Units by mouth daily.     inclisiran (LEQVIO) 284 MG/1.5ML SOSY injection Inject 1.59m at 0 months, 3 months and then every 6 months thereafter. 1.5 mL 0   levETIRAcetam (KEPPRA) 1000 MG tablet Take 1 tablet (1,000 mg total) by mouth 2 (two) times daily. 180 tablet 4   metoprolol succinate (TOPROL-XL) 25 MG 24 hr tablet Take 1/2 tablet (12.5 mg total) by mouth daily. With or immediately following a meal. 45 tablet 3   pravastatin (PRAVACHOL) 40 MG tablet Take 1 tablet (40 mg total) by mouth every evening. 90 tablet 3   pyridOXINE (VITAMIN B-6) 100 MG tablet Take 100 mg by mouth daily.     sildenafil (REVATIO) 20 MG tablet Take 2 -3 tablets by mouth once daily as needed 30 tablet 1   tamsulosin (FLOMAX) 0.4 MG CAPS capsule Take 2 capsules by mouth every day 180 capsule 1   traZODone (DESYREL) 50 MG tablet Take 0.5-1 tablets (25-50 mg total) by mouth at bedtime as needed for sleep. 30 tablet 3   No current facility-administered medications on file prior to visit.    BP 139/68   Pulse 79   Temp 97.7 F (36.5 C)   Resp 18   Ht '5\' 10"'$  (1.778 m)   Wt 191 lb 9.6 oz (86.9 kg)   SpO2 99%   BMI 27.49 kg/m        Objective:   Physical Exam  General- No acute distress. Pleasant patient. Neck- Full range of motion, no jvd Lungs- Clear, even and unlabored. Heart- regular rate and rhythm. Neurologic- CNII- XII grossly intact.  Back- no cva tenderness.  Abdomen-soft, nt, nd, +bs, no rebound or guarding.  Assessment & Plan:   Patient Instructions  Frequent urination- uti vs prostatis(hx of bph). Rx cipro 500 mg twice a day. Get cbc, psa and urine culture.  For elevated sugar will get cmp and a1c.   After reviewing labs may need to refer to urologist.  Follow date to be  determined after lab review.     Mackie Pai, PA-C

## 2022-05-07 NOTE — Patient Instructions (Signed)
Frequent urination- uti vs prostatis(hx of bph). Rx cipro 500 mg twice a day. Get cbc, psa and urine culture.  For elevated sugar will get cmp and a1c.   After reviewing labs may need to refer to urologist.  Follow date to be determined after lab review.

## 2022-05-08 ENCOUNTER — Ambulatory Visit: Payer: Medicare HMO

## 2022-05-08 DIAGNOSIS — M6281 Muscle weakness (generalized): Secondary | ICD-10-CM

## 2022-05-08 DIAGNOSIS — R252 Cramp and spasm: Secondary | ICD-10-CM

## 2022-05-08 DIAGNOSIS — M5442 Lumbago with sciatica, left side: Secondary | ICD-10-CM | POA: Diagnosis not present

## 2022-05-08 DIAGNOSIS — G8929 Other chronic pain: Secondary | ICD-10-CM

## 2022-05-08 LAB — URINE CULTURE
MICRO NUMBER:: 13930979
Result:: NO GROWTH
SPECIMEN QUALITY:: ADEQUATE

## 2022-05-08 NOTE — Therapy (Signed)
OUTPATIENT PHYSICAL THERAPY TREATMENT  Progress Note Reporting Period  04/03/22 to 05/15/22  See note below for Objective Data and Assessment of Progress/Goals.   I have reviewed current POC and progress, patient would benefit from extension of skilled physical therapy for additional  4 weeks, 2x/week to continue to progress and improve LBP and QOL.  Rennie Natter, PT, DPT     Patient Name: Erik Salazar MRN: [id number] DOB:04-19-1951, 71 y.o., male Today's Date: 05/08/2022   PT End of Session - 05/08/22 0848     Visit Number 10    Number of Visits 12    Date for PT Re-Evaluation 05/15/22    Authorization Type [employer]    Progress Note Due on Visit 10    PT Start Time 0802    PT Stop Time 0845    PT Time Calculation (min) 43 min    Activity Tolerance Patient tolerated treatment well    Behavior During Therapy [facility] for tasks assessed/performed                 Past Medical History:  Diagnosis Date   Arthritis    RA and OA   Carpal tunnel syndrome on right 07/08/2017   Cervical radiculopathy at C6 57/84/6962   Right   Complication of anesthesia    Coronary artery disease    Enlarged prostate    Fibromyalgia    H/O: knee surgery    15    History of blood clots    History of open heart surgery    ASCENDING AORTIC ANEURYSM   Ischemic stroke of frontal lobe ([place]) 03/14/2017   Bilateral; post-redo CT surgery   Pneumonia    PONV (postoperative nausea and vomiting)    only after CABG surgeries   Seizure disorder ([place]) 03/14/2017   Seizures ([place])    Status post knee surgery    DVT POST KNEE SURGERY   Stroke [facility])    Past Surgical History:  Procedure Laterality Date   ANTERIOR CERVICAL DECOMP/DISCECTOMY FUSION N/A 05/04/2020   Procedure: Anterior Cervical Decompression/Discectomy Fuion Cervical three-four, Cervical four-five, Cervical five-six;  Surgeon: Kary Kos, MD;  Location: [place];  Service: Neurosurgery;  Laterality: N/A;   BALLOON DILATION N/A  06/23/2019   Procedure: BALLOON DILATION;  Surgeon: Otis Brace, MD;  Location: [facility];  Service: Gastroenterology;  Laterality: N/A;   BENTALL PROCEDURE  01/04/2016   Bentall with 23 mm pericardial AVR; SVG-LAD, SVG-CX ([place])   BICEPS TENDON REPAIR Right    BIOPSY  06/23/2019   Procedure: BIOPSY;  Surgeon: Otis Brace, MD;  Location: [facility];  Service: Gastroenterology;;   CARDIAC SURGERY     ANUERSYM MAY 2017   Bentall procedure. Bioprosthetic aortic valve #23 mm bovine model #2700 TF ask, and 28 mm Gelweave woven vascular sinus of Valsalva graft   CARPAL TUNNEL RELEASE  08/2018   right hand    CORONARY ARTERY BYPASS GRAFT  01/04/2016   VG to LAD & VG to LCX   CORONARY ARTERY BYPASS GRAFT  03/13/2017   LIMA to LAD with steril abcess removal from dacron graft   ESOPHAGOGASTRODUODENOSCOPY     Had done dilatation done about 2 or 3 times before in [place]   ESOPHAGOGASTRODUODENOSCOPY (EGD) WITH PROPOFOL N/A 06/23/2019   Procedure: ESOPHAGOGASTRODUODENOSCOPY (EGD) WITH PROPOFOL;  Surgeon: Otis Brace, MD;  [facility];  Service: Gastroenterology;  Laterality: N/A;   FALSE ANEURYSM REPAIR  03/13/2017   redo sternotomy, sterile abscess removal from Dacron  graft, omental flap around aorta, CABG: LIMA-LAD ([name], Dr. Mart Piggs)   [place]  2018   Of pericardium 2018   INSERTION OF MESH  10/14/2020   Procedure: INSERTION OF MESH;  Surgeon: Michael Boston, MD;  Location: [place];  Service: General;;   knee surgeries     13 surgeries on knee done before knee replacement    [place] N/A 05/19/2021   Procedure: LYSIS OF ADHESIONS;  Surgeon: Michael Boston, MD;  Location: [place];  Service: General;  Laterality: N/A;  GEN & LOCAL   LYSIS OF ADHESION N/A 10/14/2020   Procedure: LYSIS OF ADHESION;  Surgeon: Michael Boston, MD;  Location: [place];  Service: General;  Laterality: N/A;    open heart surgery     03-13-2017   REPLACEMENT TOTAL KNEE Left 2015   ROTATOR CUFF REPAIR Right    done with biceps tendon repair   TONSILLECTOMY     removed as a child.   TOTAL KNEE ARTHROPLASTY Right 01/18/2022   Procedure: RIGHT TOTAL KNEE ARTHROPLASTY;  Surgeon: Leandrew Koyanagi, MD;  Location: [place];  Service: Orthopedics;  Laterality: Right;   VENTRAL HERNIA REPAIR N/A 10/14/2020   Procedure: LAPAROSCOPIC VENTRAL HERNIA REPAIR WITH TAP BLOCK BILATERAL;  Surgeon: Michael Boston, MD;  Location: [place];  Service: General;  Laterality: N/A;   VENTRAL HERNIA REPAIR N/A 05/19/2021   Procedure: LAPAROSCOPIC VENTRAL WALL HERNIA REPAIR;  Surgeon: Michael Boston, MD;  Location: [place];  Service: General;  Laterality: N/A;   Patient Active Problem List   Diagnosis Date Noted   Postop check 03/26/2022   Gait abnormality 01/29/2022   Status post total right knee replacement 01/18/2022   Coronary artery disease involving native coronary artery of native heart without angina pectoris 01/04/2022   Preop cardiovascular exam 01/04/2022   Primary osteoarthritis of right knee 01/04/2022   Acute right-sided low back pain without sciatica 12/25/2021   Aortic atherosclerosis ([place]) 09/04/2021   Spondylolisthesis at L4-L5 level 06/28/2021   S/P repair of ventral hernia 05/19/2021   S/P repair of recurrent ventral hernia 05/19/2021   Benign prostatic hyperplasia without lower urinary tract symptoms 10/14/2020   Chronic pain 10/14/2020   ED (erectile dysfunction) of organic origin 10/14/2020   Esophageal dysphagia 10/14/2020   Gastroesophageal reflux disease 10/14/2020   History of aortic valve replacement with bioprosthetic valve 10/14/2020   Hx of aortic aneurysm repair 10/14/2020   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   Thoracic aortic aneurysm without rupture ([place]) 10/14/2020   Recurrent incisional hernia 10/14/2020   Spinal stenosis in cervical region 05/04/2020   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    Cervical radiculopathy at C6 11/14/2017   Carpal tunnel syndrome on right 07/08/2017   Tremor, essential 07/08/2017   Ischemic stroke of frontal lobe ([place]) 04/05/2017   Pain in right hand 04/05/2017   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 03/27/2017   Seizure ([place]) 03/14/2017   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/13/2017   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[place]) 03/12/2017   Essential hypertension 02/26/2017   Mixed hyperlipidemia 02/26/2017    PCP: Jerline Pain MD  REFERRING PROVIDER: Eleonore Chiquito, NP  REFERRING DIAG: M43.16 Spondylolisthesis, lumbar region  Rationale for Evaluation and Treatment Rehabilitation  THERAPY DIAG:  Chronic midline low back pain with left-sided sciatica  Cramp and spasm  Muscle weakness (generalized)  ONSET DATE: chronic, worsened last six months  SUBJECTIVE:  SUBJECTIVE STATEMENT:  Pt reports he has been dealing with a UTI as of late, he was sleeping well until this happened, however his back pain has made a lot of improvement.   PERTINENT HISTORY:  History chronic back pain, multiple back surgeries, bil  knee replacement (R knee on 01/18/2022), DVT, CVA frontal lobe with memory deficits, seizures, open heart surgery to repair aneurism, CABG x 2 , ACDF, hernia repair,omental flap graft to mediastinum  PAIN:  Are you having pain? Yes: NPRS scale: 4/10 Pain location: across low back  Pain description: shooting sharp pain, sometimes dull.  13/10 at night.  Aggravating factors: laying down, walking long distances, sitting >30 min  Relieving factors: changing positions constantly   PRECAUTIONS: None  WEIGHT BEARING RESTRICTIONS No  FALLS:  Has patient fallen in last 6 months? No  LIVING ENVIRONMENT: Lives with: lives with their spouse Lives  in: House/apartment Stairs: No Has following equipment at home: Single point cane, Environmental consultant - 2 wheeled, and bed side commode  OCCUPATION: retired Curator  PLOF: Independent  walks daily 8k steps/day, also goes to gym  PATIENT GOALS decrease low back pain, sleep better.    OBJECTIVE:   DIAGNOSTIC FINDINGS:  Lumbar spine 06/28/2021 IMPRESSION: Fluoroscopic assistance was provided for surgical fusion at L5-S1level.  PATIENT SURVEYS:  Modified Oswestry 20/50= 40% moderate disability    SCREENING FOR RED FLAGS: Bowel or bladder incontinence: No Spinal tumors: No Cauda equina syndrome: No Compression fracture: No Abdominal aneurysm: Yes: small, being monitored  COGNITION:  Overall cognitive status: History of cognitive impairments - at baseline for memory from CVA.      SENSATION: WFL  MUSCLE LENGTH: Hamstrings: Right 70 deg; Left 90 deg  POSTURE: decreased lumbar lordosis  PALPATION: Tenderness lumbar spine with PA mobs, L QL, L gluteus medius, R piriformis, R SIJ  LUMBAR ROM:   Active  AROM  eval 05/08/22 AROM  Flexion WNL ** WNL  Extension 75% limit ** 25% limit  Right lateral flexion To knee To knee-   Left lateral flexion To knee little pain To knee - tight  Right rotation WNL* WNL  Left rotation WNL* WNL   (Blank rows = not tested) *pain, **increased pain  LOWER EXTREMITY ROM:     Active  Right eval Left eval  Hip internal rotation 20 30  Hip external rotation 35 40  Knee flexion 120 130  Knee extension 5 0  Ankle dorsiflexion    Ankle plantarflexion     (Blank rows = not tested)  LOWER EXTREMITY MMT:  5/5 bil LE all myotomes.    LUMBAR SPECIAL TESTS:  Straight leg raise test: increased pain bilaterally, Slump test: Negative, and SI Compression/distraction test: Negative  FUNCTIONAL TESTS:  NT  GAIT: Distance walked: 39' Assistive device utilized: None Level of assistance: Complete Independence Comments: no significant deviation  or device.  Walks 3-5,000 steps every morning.     TODAY'S TREATMENT  05/08/22 Therapeutic Exercise: Rec Bike L4x44mn [place] press 10# 2x10 Standing lat pull + march green TB x 10 Standing SLS + lat pull green TB 3x10 sec hold each leg  Manual Therapy: STM to bil lumbar paraspinals, QL    05/04/2022 Therapeutic Exercise: to improve strength and mobility.  L4 x 8 min  Paloff press 5# 2 x 10 bil  Manual Therapy: to decrease muscle spasm and pain and improve mobility.  Deep tissue/trigger point release bil QL, glut med, piriformis; skilled palpation and monitoring during dry needling. Trigger Point Dry-Needling  Treatment instructions: Expect mild to moderate muscle soreness. S/S of pneumothorax if dry needled over a lung field, and to seek immediate medical attention should they occur. Patient verbalized understanding of these instructions and education.  Patient Consent Given: Yes Education handout provided: Previously provided Muscles treated: bil glut med and piriformis Treatment response/outcome: Twitch Response Elicited and Palpable Increase in Muscle Length  05/02/2022  Therapeutic Exercise: to improve strength and mobility.  Demo, verbal and tactile cues throughout for technique. Bike L4 x 8 min (2 miles) Hip openers x 10 bil  Core strengthening - passing 10lb weight around trunk x 10 each direction Suitcase walks - f/b 2 x 40' with 10# weight, each side Manual Therapy: to decrease muscle spasm and pain and improve mobility.  In side lying with pillow between knees, STM/TPR to bil QL, glut med, piriformis   04/24/2022 Therapeutic Exercise: to improve strength and mobility.  Demo, verbal and tactile cues throughout for technique. Nustep L6 x 5 min  Hip Openers at door x 15 bil  Leg extensions while planking on ball (on table) 2 x 15 bil  Paloff press 10# x 15 bil  Lunges x 15 bil Side lunge with foot on towel to encourage SLS x 15 bil  Back lunge with foot on towel to  encourage SLS x 15 bil  QL stretch in doorway Manual Therapy: to decrease muscle spasm and pain and improve mobility IASTM with foam roller to bil glutes, proximal hamstrings, lumbar paraspinals, STM/TPR to bil QL, glut med, piriformis, MFR bil QL.    PATIENT EDUCATION:  Education details: try placing pillow under side at night to improve comfort.  Person educated: Patient Education method: Customer service manager Education comprehension: verbalized understanding   HOME EXERCISE PROGRAM: Access Code: [email or web address]  ASSESSMENT:  CLINICAL IMPRESSION: Kody reports that his back pain has made 60-70% overall improvement - progress being made toward LTG #2. His lumbar ROM has made improvements as far as pain in all directions, with regard to ROM he has improved with extension - progress is being made with LTG #4. With the improvements made so far, he still reports some difficulty with sleeping at night and waking up very sore from it. Lately he has dealing with a UTI which he reported comes about every year, which has been also making it harder to sleep at night and causing him frequent urination. Pt reports the most improvement in pain from deep tissue massage, which we did post exercise today. He was also able to tolerate prone position for manual therapy. Pt would continue to benefit from skilled PT to improve full lumbar ROM and pain to improve functional abilities and sleeping tolerance.    OBJECTIVE IMPAIRMENTS decreased activity tolerance, decreased mobility, decreased strength, increased fascial restrictions, impaired perceived functional ability, increased muscle spasms, improper body mechanics, postural dysfunction, and pain.   ACTIVITY LIMITATIONS carrying, lifting, bending, sitting, standing, sleeping, transfers, and bed mobility  PARTICIPATION LIMITATIONS: cleaning, laundry, shopping, community activity, and yard work  PERSONAL FACTORS Age, Time since onset of  injury/illness/exacerbation, and 3+ comorbidities: recent R TKA, chronic LBP, abdominal surgeries, seizure disorder, memory impairments secondary to CVA  are also affecting patient's functional outcome.   REHAB POTENTIAL: Good  CLINICAL DECISION MAKING: Stable/uncomplicated  EVALUATION COMPLEXITY: Low   GOALS: Goals reviewed with patient? Yes  SHORT TERM GOALS: Target date: 04/17/2022   Patient will be independent with initial HEP.  Baseline: given Goal status: MET   LONG TERM GOALS: Target date: 05/15/2022  extended to 06/15/2022  Patient will be independent with advanced/ongoing HEP to improve outcomes and carryover.  Baseline: needs progression Goal status: IN PROGRESS- 05/08/22 met for current  2.  Patient will report 75% improvement in low back pain to improve QOL.  Baseline: 8-10/10 LBP Goal status: IN PROGRESS - 60-70% improvement 05/08/22  3.  Patient will report 75% improvement in sleep disruption due to low back pain.  Baseline: sleepless nightly 1-2 hours due to pain Goal status: IN PROGRESS  4.  Patient will demonstrate full pain free lumbar ROM to perform ADLs.   Baseline: pain all directions Goal status: IN PROGRESS - 05/08/22 improved, still limited with side bends both sides  5.  Patient will demonstrate improved functional strength as demonstrated by no stomach bulging with bed mobility. Baseline: core weaknes Goal status: IN PROGRESS  6.  Patient will report 15/50 or less on Oswestry to demonstrate improved functional ability.  Baseline: 20/50 = moderate disability  Goal status: IN PROGRESS   7.  Patient will tolerate 45 min of sitting without increased LBP for traveling, eating meals, etc. Baseline: increased pain after 15 min sitting Goal status: IN PROGRESS  8.  Patient will be able to transfer from floor to standing safely.  Baseline: unable Goal status: IN PROGRESS    PLAN: PT FREQUENCY: 2x/week  PT DURATION: 6 weeks extended 4 weeks to  06/15/2022  PLANNED INTERVENTIONS: Therapeutic exercises, Therapeutic activity, Neuromuscular re-education, Balance training, Gait training, Patient/Family education, Self Care, Joint mobilization, Stair training, Dry Needling, Electrical stimulation, Spinal mobilization, Moist heat, Taping, Traction, Ultrasound, Manual therapy, and Re-evaluation.  PLAN FOR NEXT SESSION: progress core strengthening exercises focusing on sitting/standing or supine. STM to decrease pain;  Does not tolerate prone positioning.  Manual therapy and modalities PRN.     Artist Pais, PTA 05/08/2022, 8:48 AM   Rennie Natter, PT, DPT 05/10/2022  10:08 AM

## 2022-05-09 ENCOUNTER — Encounter: Payer: Self-pay | Admitting: Medical

## 2022-05-10 ENCOUNTER — Encounter: Payer: Self-pay | Admitting: Medical

## 2022-05-10 NOTE — Addendum Note (Signed)
Addended by: Rennie Natter on: 05/10/2022 10:10 AM   Modules accepted: Orders

## 2022-05-14 ENCOUNTER — Encounter: Payer: Self-pay | Admitting: Family

## 2022-05-15 ENCOUNTER — Other Ambulatory Visit (HOSPITAL_BASED_OUTPATIENT_CLINIC_OR_DEPARTMENT_OTHER): Payer: Self-pay

## 2022-05-15 ENCOUNTER — Ambulatory Visit: Payer: Medicare HMO

## 2022-05-15 DIAGNOSIS — M6281 Muscle weakness (generalized): Secondary | ICD-10-CM

## 2022-05-15 DIAGNOSIS — G8929 Other chronic pain: Secondary | ICD-10-CM

## 2022-05-15 DIAGNOSIS — R252 Cramp and spasm: Secondary | ICD-10-CM

## 2022-05-15 DIAGNOSIS — M5442 Lumbago with sciatica, left side: Secondary | ICD-10-CM | POA: Diagnosis not present

## 2022-05-15 MED ORDER — TAMSULOSIN HCL 0.4 MG PO CAPS
0.8000 mg | ORAL_CAPSULE | Freq: Every day | ORAL | 1 refills | Status: DC
  Filled 2022-05-15: qty 180, 90d supply, fill #0
  Filled 2022-08-09: qty 180, 90d supply, fill #1

## 2022-05-15 NOTE — Therapy (Signed)
OUTPATIENT PHYSICAL THERAPY TREATMENT  Progress Note Reporting Period  04/03/22 to 05/15/22  See note below for Objective Data and Assessment of Progress/Goals.   I have reviewed current POC and progress, patient would benefit from extension of skilled physical therapy for additional  4 weeks, 2x/week to continue to progress and improve LBP and QOL.  Artist Pais, PTA, DPT     Patient Name: Erik Salazar MRN: [id number] DOB:11/12/1950, 71 y.o., male Today's Date: 05/15/2022   PT End of Session - 05/15/22 0845     Visit Number 11    Number of Visits 12    Date for PT Re-Evaluation 06/15/22    Authorization Type [employer]    Progress Note Due on Visit 56    PT Start Time 0800    PT Stop Time 0843    PT Time Calculation (min) 43 min    Activity Tolerance Patient tolerated treatment well    Behavior During Therapy [facility] for tasks assessed/performed                  Past Medical History:  Diagnosis Date   Arthritis    RA and OA   Carpal tunnel syndrome on right 07/08/2017   Cervical radiculopathy at C6 78/24/2353   Right   Complication of anesthesia    Coronary artery disease    Enlarged prostate    Fibromyalgia    H/O: knee surgery    15    History of blood clots    History of open heart surgery    ASCENDING AORTIC ANEURYSM   Ischemic stroke of frontal lobe ([place]) 03/14/2017   Bilateral; post-redo CT surgery   Pneumonia    PONV (postoperative nausea and vomiting)    only after CABG surgeries   Seizure disorder ([place]) 03/14/2017   Seizures ([place])    Status post knee surgery    DVT POST KNEE SURGERY   [facility])    Past Surgical History:  Procedure Laterality Date   ANTERIOR CERVICAL DECOMP/DISCECTOMY FUSION N/A 05/04/2020   Procedure: Anterior Cervical Decompression/Discectomy Fuion Cervical three-four, Cervical four-five, Cervical five-six;  Surgeon: Kary Kos, MD;  Location: [place];  Service: Neurosurgery;  Laterality: N/A;   BALLOON DILATION N/A  06/23/2019   Procedure: BALLOON DILATION;  Surgeon: Otis Brace, MD;  Location: [facility];  Service: Gastroenterology;  Laterality: N/A;   BENTALL PROCEDURE  01/04/2016   Bentall with 23 mm pericardial AVR; SVG-LAD, SVG-CX ([place])   BICEPS TENDON REPAIR Right    BIOPSY  06/23/2019   Procedure: BIOPSY;  Surgeon: Otis Brace, MD;  Location: [facility];  Service: Gastroenterology;;   CARDIAC SURGERY     ANUERSYM MAY 2017   Bentall procedure. Bioprosthetic aortic valve #23 mm bovine model #2700 TF ask, and 28 mm Gelweave woven vascular sinus of Valsalva graft   CARPAL TUNNEL RELEASE  08/2018   right hand    CORONARY ARTERY BYPASS GRAFT  01/04/2016   VG to LAD & VG to LCX   CORONARY ARTERY BYPASS GRAFT  03/13/2017   LIMA to LAD with steril abcess removal from dacron graft   ESOPHAGOGASTRODUODENOSCOPY     Had done dilatation done about 2 or 3 times before in [place]   ESOPHAGOGASTRODUODENOSCOPY (EGD) WITH PROPOFOL N/A 06/23/2019   Procedure: ESOPHAGOGASTRODUODENOSCOPY (EGD) WITH PROPOFOL;  Surgeon: Otis Brace, MD;  [facility];  Service: Gastroenterology;  Laterality: N/A;   FALSE ANEURYSM REPAIR  03/13/2017   redo sternotomy, sterile abscess removal from  Dacron graft, omental flap around aorta, CABG: LIMA-LAD ([name], Dr. Mart Piggs)   [place]  2018   Of pericardium 2018   INSERTION OF MESH  10/14/2020   Procedure: INSERTION OF MESH;  Surgeon: Michael Boston, MD;  Location: [place];  Service: General;;   knee surgeries     13 surgeries on knee done before knee replacement    [place] N/A 05/19/2021   Procedure: LYSIS OF ADHESIONS;  Surgeon: Michael Boston, MD;  Location: [place];  Service: General;  Laterality: N/A;  GEN & LOCAL   LYSIS OF ADHESION N/A 10/14/2020   Procedure: LYSIS OF ADHESION;  Surgeon: Michael Boston, MD;  Location: [place];  Service: General;  Laterality: N/A;    open heart surgery     03-13-2017   REPLACEMENT TOTAL KNEE Left 2015   ROTATOR CUFF REPAIR Right    done with biceps tendon repair   TONSILLECTOMY     removed as a child.   TOTAL KNEE ARTHROPLASTY Right 01/18/2022   Procedure: RIGHT TOTAL KNEE ARTHROPLASTY;  Surgeon: Leandrew Koyanagi, MD;  Location: [place];  Service: Orthopedics;  Laterality: Right;   VENTRAL HERNIA REPAIR N/A 10/14/2020   Procedure: LAPAROSCOPIC VENTRAL HERNIA REPAIR WITH TAP BLOCK BILATERAL;  Surgeon: Michael Boston, MD;  Location: [place];  Service: General;  Laterality: N/A;   VENTRAL HERNIA REPAIR N/A 05/19/2021   Procedure: LAPAROSCOPIC VENTRAL WALL HERNIA REPAIR;  Surgeon: Michael Boston, MD;  Location: [place];  Service: General;  Laterality: N/A;   Patient Active Problem List   Diagnosis Date Noted   Postop check 03/26/2022   Gait abnormality 01/29/2022   Status post total right knee replacement 01/18/2022   Coronary artery disease involving native coronary artery of native heart without angina pectoris 01/04/2022   Preop cardiovascular exam 01/04/2022   Primary osteoarthritis of right knee 01/04/2022   Acute right-sided low back pain without sciatica 12/25/2021   Aortic atherosclerosis ([place]) 09/04/2021   Spondylolisthesis at L4-L5 level 06/28/2021   S/P repair of ventral hernia 05/19/2021   S/P repair of recurrent ventral hernia 05/19/2021   Benign prostatic hyperplasia without lower urinary tract symptoms 10/14/2020   Chronic pain 10/14/2020   ED (erectile dysfunction) of organic origin 10/14/2020   Esophageal dysphagia 10/14/2020   Gastroesophageal reflux disease 10/14/2020   History of aortic valve replacement with bioprosthetic valve 10/14/2020   Hx of aortic aneurysm repair 10/14/2020   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   Thoracic aortic aneurysm without rupture ([place]) 10/14/2020   Recurrent incisional hernia 10/14/2020   Spinal stenosis in cervical region 05/04/2020   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    Cervical radiculopathy at C6 11/14/2017   Carpal tunnel syndrome on right 07/08/2017   Tremor, essential 07/08/2017   Ischemic stroke of frontal lobe ([place]) 04/05/2017   Pain in right hand 04/05/2017   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 03/27/2017   Seizure ([place]) 03/14/2017   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/13/2017   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[place]) 03/12/2017   Essential hypertension 02/26/2017   Mixed hyperlipidemia 02/26/2017    PCP: Jerline Pain MD  REFERRING PROVIDER: Eleonore Chiquito, NP  REFERRING DIAG: M43.16 Spondylolisthesis, lumbar region  Rationale for Evaluation and Treatment Rehabilitation  THERAPY DIAG:  Chronic midline low back pain with left-sided sciatica  Cramp and spasm  Muscle weakness (generalized)  ONSET DATE: chronic, worsened last six months  SUBJECTIVE:  SUBJECTIVE STATEMENT:  Pt reports he has been dealing with a UTI as of late, he was sleeping well until this happened, however his back pain has made a lot of improvement.   PERTINENT HISTORY:  History chronic back pain, multiple back surgeries, bil  knee replacement (R knee on 01/18/2022), DVT, CVA frontal lobe with memory deficits, seizures, open heart surgery to repair aneurism, CABG x 2 , ACDF, hernia repair,omental flap graft to mediastinum  PAIN:  Are you having pain? Yes: NPRS scale: 4/10 Pain location: across low back  Pain description: shooting sharp pain, sometimes dull.  13/10 at night.  Aggravating factors: laying down, walking long distances, sitting >30 min  Relieving factors: changing positions constantly   PRECAUTIONS: None  WEIGHT BEARING RESTRICTIONS No  FALLS:  Has patient fallen in last 6 months? No  LIVING ENVIRONMENT: Lives with: lives with their spouse Lives  in: House/apartment Stairs: No Has following equipment at home: Single point cane, Environmental consultant - 2 wheeled, and bed side commode  OCCUPATION: retired Curator  PLOF: Independent  walks daily 8k steps/day, also goes to gym  PATIENT GOALS decrease low back pain, sleep better.    OBJECTIVE:   DIAGNOSTIC FINDINGS:  Lumbar spine 06/28/2021 IMPRESSION: Fluoroscopic assistance was provided for surgical fusion at L5-S1level.  PATIENT SURVEYS:  Modified Oswestry 20/50= 40% moderate disability    SCREENING FOR RED FLAGS: Bowel or bladder incontinence: No Spinal tumors: No Cauda equina syndrome: No Compression fracture: No Abdominal aneurysm: Yes: small, being monitored  COGNITION:  Overall cognitive status: History of cognitive impairments - at baseline for memory from CVA.      SENSATION: WFL  MUSCLE LENGTH: Hamstrings: Right 70 deg; Left 90 deg  POSTURE: decreased lumbar lordosis  PALPATION: Tenderness lumbar spine with PA mobs, L QL, L gluteus medius, R piriformis, R SIJ  LUMBAR ROM:   Active  AROM  eval 05/08/22 AROM  Flexion WNL ** WNL  Extension 75% limit ** 25% limit  Right lateral flexion To knee To knee-   Left lateral flexion To knee little pain To knee - tight  Right rotation WNL* WNL  Left rotation WNL* WNL   (Blank rows = not tested) *pain, **increased pain  LOWER EXTREMITY ROM:     Active  Right eval Left eval  Hip internal rotation 20 30  Hip external rotation 35 40  Knee flexion 120 130  Knee extension 5 0  Ankle dorsiflexion    Ankle plantarflexion     (Blank rows = not tested)  LOWER EXTREMITY MMT:  5/5 bil LE all myotomes.    LUMBAR SPECIAL TESTS:  Straight leg raise test: increased pain bilaterally, Slump test: Negative, and SI Compression/distraction test: Negative  FUNCTIONAL TESTS:  NT  GAIT: Distance walked: 76' Assistive device utilized: None Level of assistance: Complete Independence Comments: no significant deviation  or device.  Walks 3-5,000 steps every morning.     TODAY'S TREATMENT  05/15/22 TherEx: Rec Bike L3x52mn Seated lumbar flexion stretch w/ green yoga ball 3 way 2x30 sec each Seated trunk rotations 10x bil Standing shoulder extensions 2x15 15# BATCA row 25# 2x15  Manual Therapy: STM to bil lumbar paraspinals and QL  05/08/22 Therapeutic Exercise: Rec Bike L4x856m Pallof press 10# 2x10 Standing lat pull + march green TB x 10 Standing SLS + lat pull green TB 3x10 sec hold each leg  Manual Therapy: STM to bil lumbar paraspinals, QL    05/04/2022 Therapeutic Exercise: to improve strength and mobility.  L4  x 8 min  Paloff press 5# 2 x 10 bil  Manual Therapy: to decrease muscle spasm and pain and improve mobility.  Deep tissue/trigger point release bil QL, glut med, piriformis; skilled palpation and monitoring during dry needling. Trigger Point Dry-Needling  Treatment instructions: Expect mild to moderate muscle soreness. S/S of pneumothorax if dry needled over a lung field, and to seek immediate medical attention should they occur. Patient verbalized understanding of these instructions and education.  Patient Consent Given: Yes Education handout provided: Previously provided Muscles treated: bil glut med and piriformis Treatment response/outcome: Twitch Response Elicited and Palpable Increase in Muscle Length  05/02/2022  Therapeutic Exercise: to improve strength and mobility.  Demo, verbal and tactile cues throughout for technique. Bike L4 x 8 min (2 miles) Hip openers x 10 bil  Core strengthening - passing 10lb weight around trunk x 10 each direction Suitcase walks - f/b 2 x 40' with 10# weight, each side Manual Therapy: to decrease muscle spasm and pain and improve mobility.  In side lying with pillow between knees, STM/TPR to bil QL, glut med, piriformis   04/24/2022 Therapeutic Exercise: to improve strength and mobility.  Demo, verbal and tactile cues throughout for  technique. Nustep L6 x 5 min  Hip Openers at door x 15 bil  Leg extensions while planking on ball (on table) 2 x 15 bil  Paloff press 10# x 15 bil  Lunges x 15 bil Side lunge with foot on towel to encourage SLS x 15 bil  Back lunge with foot on towel to encourage SLS x 15 bil  QL stretch in doorway Manual Therapy: to decrease muscle spasm and pain and improve mobility IASTM with foam roller to bil glutes, proximal hamstrings, lumbar paraspinals, STM/TPR to bil QL, glut med, piriformis, MFR bil QL.    PATIENT EDUCATION:  Education details: try placing pillow under side at night to improve comfort.  Person educated: Patient Education method: Customer service manager Education comprehension: verbalized understanding   HOME EXERCISE PROGRAM: Access Code: [email or web address]  ASSESSMENT:  CLINICAL IMPRESSION: Pt responded well to treatment. He got a steroid injection yesterday, which he reported he went all night last night w/o pain. We continued with postural exercises and core stabilization to tolerance. He had no issues with exercises and good response to MT.    OBJECTIVE IMPAIRMENTS decreased activity tolerance, decreased mobility, decreased strength, increased fascial restrictions, impaired perceived functional ability, increased muscle spasms, improper body mechanics, postural dysfunction, and pain.   ACTIVITY LIMITATIONS carrying, lifting, bending, sitting, standing, sleeping, transfers, and bed mobility  PARTICIPATION LIMITATIONS: cleaning, laundry, shopping, community activity, and yard work  PERSONAL FACTORS Age, Time since onset of injury/illness/exacerbation, and 3+ comorbidities: recent R TKA, chronic LBP, abdominal surgeries, seizure disorder, memory impairments secondary to CVA  are also affecting patient's functional outcome.   REHAB POTENTIAL: Good  CLINICAL DECISION MAKING: Stable/uncomplicated  EVALUATION COMPLEXITY: Low   GOALS: Goals reviewed with patient?  Yes  SHORT TERM GOALS: Target date: 04/17/2022   Patient will be independent with initial HEP.  Baseline: given Goal status: MET   LONG TERM GOALS: Target date: 05/15/2022  extended to 06/15/2022  Patient will be independent with advanced/ongoing HEP to improve outcomes and carryover.  Baseline: needs progression Goal status: IN PROGRESS- 05/08/22 met for current  2.  Patient will report 75% improvement in low back pain to improve QOL.  Baseline: 8-10/10 LBP Goal status: IN PROGRESS - 60-70% improvement 05/08/22  3.  Patient will report 75% improvement  in sleep disruption due to low back pain.  Baseline: sleepless nightly 1-2 hours due to pain Goal status: IN PROGRESS  4.  Patient will demonstrate full pain free lumbar ROM to perform ADLs.   Baseline: pain all directions Goal status: IN PROGRESS - 05/08/22 improved, still limited with side bends both sides  5.  Patient will demonstrate improved functional strength as demonstrated by no stomach bulging with bed mobility. Baseline: core weaknes Goal status: IN PROGRESS  6.  Patient will report 15/50 or less on Oswestry to demonstrate improved functional ability.  Baseline: 20/50 = moderate disability  Goal status: IN PROGRESS   7.  Patient will tolerate 45 min of sitting without increased LBP for traveling, eating meals, etc. Baseline: increased pain after 15 min sitting Goal status: IN PROGRESS  8.  Patient will be able to transfer from floor to standing safely.  Baseline: unable Goal status: IN PROGRESS    PLAN: PT FREQUENCY: 2x/week  PT DURATION: 6 weeks extended 4 weeks to 06/15/2022  PLANNED INTERVENTIONS: Therapeutic exercises, Therapeutic activity, Neuromuscular re-education, Balance training, Gait training, Patient/Family education, Self Care, Joint mobilization, Stair training, Dry Needling, Electrical stimulation, Spinal mobilization, Moist heat, Taping, Traction, Ultrasound, Manual therapy, and  Re-evaluation.  PLAN FOR NEXT SESSION: progress core strengthening exercises focusing on sitting/standing or supine. STM to decrease pain;  Does not tolerate prone positioning.  Manual therapy and modalities PRN.     Artist Pais, PTA 05/15/2022, 9:14 AM

## 2022-05-17 ENCOUNTER — Ambulatory Visit: Payer: Medicare HMO

## 2022-05-17 DIAGNOSIS — M6281 Muscle weakness (generalized): Secondary | ICD-10-CM

## 2022-05-17 DIAGNOSIS — G8929 Other chronic pain: Secondary | ICD-10-CM

## 2022-05-17 DIAGNOSIS — R252 Cramp and spasm: Secondary | ICD-10-CM

## 2022-05-17 DIAGNOSIS — M5442 Lumbago with sciatica, left side: Secondary | ICD-10-CM | POA: Diagnosis not present

## 2022-05-17 NOTE — Therapy (Addendum)
OUTPATIENT PHYSICAL THERAPY TREATMENT PHYSICAL THERAPY DISCHARGE SUMMARY  Visits from Start of Care: 12  Current functional level related to goals / functional outcomes: 60-70% improvement in LBP   Remaining deficits: Continued LBP especially at night   Education / Equipment: HEP  Plan: Patient goals were not met. Patient is being discharged due to cancelling remaining visits due to illness.  He has not been seen since 05/17/2022 and would require new order to return to physical therapy.     Rennie Natter, PT, DPT 2:24 PM 07/10/2022      Patient Name: Erik Salazar MRN: [id number] DOB:1951/08/13, 71 y.o., male Today's Date: 05/17/2022   PT End of Session - 05/17/22 0846     Visit Number 12    Date for PT Re-Evaluation 06/15/22    Authorization Type [employer]    Progress Note Due on Visit 42    PT Start Time 0801    PT Stop Time 0844    PT Time Calculation (min) 43 min    Activity Tolerance Patient tolerated treatment well    Behavior During Therapy [facility] for tasks assessed/performed                   Past Medical History:  Diagnosis Date   Arthritis    RA and OA   Carpal tunnel syndrome on right 07/08/2017   Cervical radiculopathy at C6 74/94/4967   Right   Complication of anesthesia    Coronary artery disease    Enlarged prostate    Fibromyalgia    H/O: knee surgery    15    History of blood clots    History of open heart surgery    ASCENDING AORTIC ANEURYSM   Ischemic stroke of frontal lobe ([place]) 03/14/2017   Bilateral; post-redo CT surgery   Pneumonia    PONV (postoperative nausea and vomiting)    only after CABG surgeries   Seizure disorder ([place]) 03/14/2017   Seizures ([place])    Status post knee surgery    DVT POST KNEE SURGERY   Stroke [facility])    Past Surgical History:  Procedure Laterality Date   ANTERIOR CERVICAL DECOMP/DISCECTOMY FUSION N/A 05/04/2020   Procedure: Anterior Cervical Decompression/Discectomy Fuion Cervical  three-four, Cervical four-five, Cervical five-six;  Surgeon: Kary Kos, MD;  Location: [place];  Service: Neurosurgery;  Laterality: N/A;   BALLOON DILATION N/A 06/23/2019   Procedure: BALLOON DILATION;  Surgeon: Otis Brace, MD;  [facility]: [facility];  Service: Gastroenterology;  Laterality: N/A;   BENTALL PROCEDURE  01/04/2016   Bentall with 23 mm pericardial AVR; SVG-LAD, SVG-CX ([place])   BICEPS TENDON REPAIR Right    BIOPSY  06/23/2019   Procedure: BIOPSY;  Surgeon: Otis Brace, MD;  [facility]: [facility];  Service: Gastroenterology;;   CARDIAC SURGERY     ANUERSYM MAY 2017   Bentall procedure. Bioprosthetic aortic valve #23 mm bovine model #2700 TF ask, and 28 mm Gelweave woven vascular sinus of Valsalva graft   CARPAL TUNNEL RELEASE  08/2018   right hand    CORONARY ARTERY BYPASS GRAFT  01/04/2016   VG to LAD & VG to LCX   CORONARY ARTERY BYPASS GRAFT  03/13/2017   LIMA to LAD with steril abcess removal from dacron graft   ESOPHAGOGASTRODUODENOSCOPY     Had done dilatation done about 2 or 3 times before in [place]   ESOPHAGOGASTRODUODENOSCOPY (EGD) WITH PROPOFOL N/A 06/23/2019   Procedure: ESOPHAGOGASTRODUODENOSCOPY (EGD) WITH PROPOFOL;  Surgeon: Otis Brace, MD;  [facility];  Service: Gastroenterology;  Laterality: N/A;   FALSE ANEURYSM REPAIR  03/13/2017   redo sternotomy, sterile abscess removal from Dacron graft, omental flap around aorta, CABG: LIMA-LAD ([name], Dr. Mart Piggs)   [place]  2018   Of pericardium 2018   INSERTION OF MESH  10/14/2020   Procedure: INSERTION OF MESH;  Surgeon: Michael Boston, MD;  Location: [place];  Service: General;;   knee surgeries     13 surgeries on knee done before knee replacement    [place] N/A 05/19/2021   Procedure: LYSIS OF ADHESIONS;  Surgeon: Michael Boston, MD;  Location: [place];  Service: General;  Laterality: N/A;  GEN &  LOCAL   LYSIS OF ADHESION N/A 10/14/2020   Procedure: LYSIS OF ADHESION;  Surgeon: Michael Boston, MD;  Location: [place];  Service: General;  Laterality: N/A;   open heart surgery     03-13-2017   REPLACEMENT TOTAL KNEE Left 2015   ROTATOR CUFF REPAIR Right    done with biceps tendon repair   TONSILLECTOMY     removed as a child.   TOTAL KNEE ARTHROPLASTY Right 01/18/2022   Procedure: RIGHT TOTAL KNEE ARTHROPLASTY;  Surgeon: Leandrew Koyanagi, MD;  Location: [place];  Service: Orthopedics;  Laterality: Right;   VENTRAL HERNIA REPAIR N/A 10/14/2020   Procedure: LAPAROSCOPIC VENTRAL HERNIA REPAIR WITH TAP BLOCK BILATERAL;  Surgeon: Michael Boston, MD;  Location: [place];  Service: General;  Laterality: N/A;   VENTRAL HERNIA REPAIR N/A 05/19/2021   Procedure: LAPAROSCOPIC VENTRAL WALL HERNIA REPAIR;  Surgeon: Michael Boston, MD;  Location: [place];  Service: General;  Laterality: N/A;   Patient Active Problem List   Diagnosis Date Noted   Postop check 03/26/2022   Gait abnormality 01/29/2022   Status post total right knee replacement 01/18/2022   Coronary artery disease involving native coronary artery of native heart without angina pectoris 01/04/2022   Preop cardiovascular exam 01/04/2022   Primary osteoarthritis of right knee 01/04/2022   Acute right-sided low back pain without sciatica 12/25/2021   Aortic atherosclerosis ([place]) 09/04/2021   Spondylolisthesis at L4-L5 level 06/28/2021   S/P repair of ventral hernia 05/19/2021   S/P repair of recurrent ventral hernia 05/19/2021   Benign prostatic hyperplasia without lower urinary tract symptoms 10/14/2020   Chronic pain 10/14/2020   ED (erectile dysfunction) of organic origin 10/14/2020   Esophageal dysphagia 10/14/2020   Gastroesophageal reflux disease 10/14/2020   History of aortic valve replacement with bioprosthetic valve 10/14/2020   Hx of aortic aneurysm repair 10/14/2020   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   Thoracic aortic aneurysm  without rupture ([place]) 10/14/2020   Recurrent incisional hernia 10/14/2020   Spinal stenosis in cervical region 05/04/2020   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   Cervical radiculopathy at C6 11/14/2017   Carpal tunnel syndrome on right 07/08/2017   Tremor, essential 07/08/2017   Ischemic stroke of frontal lobe ([place]) 04/05/2017   Pain in right hand 04/05/2017   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 03/27/2017   Seizure ([place]) 03/14/2017   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/13/2017   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[place]) 03/12/2017   Essential hypertension 02/26/2017   Mixed hyperlipidemia 02/26/2017    PCP: Jerline Pain MD  REFERRING PROVIDER: Eleonore Chiquito, NP  REFERRING DIAG: M43.16 Spondylolisthesis, lumbar region  Rationale for Evaluation and Treatment Rehabilitation  THERAPY DIAG:  Chronic midline low back pain  with left-sided sciatica  Cramp and spasm  Muscle weakness (generalized)  ONSET DATE: chronic, worsened last six months  SUBJECTIVE:                                                                                                                                                                                           SUBJECTIVE STATEMENT:  Pt still reports most issues from UTI, which he sees specialist tomorrow.  PERTINENT HISTORY:  History chronic back pain, multiple back surgeries, bil  knee replacement (R knee on 01/18/2022), DVT, CVA frontal lobe with memory deficits, seizures, open heart surgery to repair aneurism, CABG x 2 , ACDF, hernia repair,omental flap graft to mediastinum  PAIN:  Are you having pain? Yes: NPRS scale: 3/10 Pain location: across low back  Pain description: shooting sharp pain, sometimes dull.  13/10 at night.  Aggravating factors: laying down, walking long distances, sitting >30 min  Relieving factors: changing positions constantly   PRECAUTIONS: None  WEIGHT BEARING RESTRICTIONS No  FALLS:  Has patient fallen in last  6 months? No  LIVING ENVIRONMENT: Lives with: lives with their spouse Lives in: House/apartment Stairs: No Has following equipment at home: Single point cane, Environmental consultant - 2 wheeled, and bed side commode  OCCUPATION: retired Curator  PLOF: Independent  walks daily 8k steps/day, also goes to gym  PATIENT GOALS decrease low back pain, sleep better.    OBJECTIVE:   DIAGNOSTIC FINDINGS:  Lumbar spine 06/28/2021 IMPRESSION: Fluoroscopic assistance was provided for surgical fusion at L5-S1level.  PATIENT SURVEYS:  Modified Oswestry 20/50= 40% moderate disability    SCREENING FOR RED FLAGS: Bowel or bladder incontinence: No Spinal tumors: No Cauda equina syndrome: No Compression fracture: No Abdominal aneurysm: Yes: small, being monitored  COGNITION:  Overall cognitive status: History of cognitive impairments - at baseline for memory from CVA.      SENSATION: WFL  MUSCLE LENGTH: Hamstrings: Right 70 deg; Left 90 deg  POSTURE: decreased lumbar lordosis  PALPATION: Tenderness lumbar spine with PA mobs, L QL, L gluteus medius, R piriformis, R SIJ  LUMBAR ROM:   Active  AROM  eval 05/08/22 AROM  Flexion WNL ** WNL  Extension 75% limit ** 25% limit  Right lateral flexion To knee To knee-   Left lateral flexion To knee little pain To knee - tight  Right rotation WNL* WNL  Left rotation WNL* WNL   (Blank rows = not tested) *pain, **increased pain  LOWER EXTREMITY ROM:     Active  Right eval Left eval  Hip internal rotation 20 30  Hip external rotation 35 40  Knee flexion 120 130  Knee extension 5 0  Ankle dorsiflexion    Ankle plantarflexion     (Blank rows = not tested)  LOWER EXTREMITY MMT:  5/5 bil LE all myotomes.    LUMBAR SPECIAL TESTS:  Straight leg raise test: increased pain bilaterally, Slump test: Negative, and SI Compression/distraction test: Negative  FUNCTIONAL TESTS:  NT  GAIT: Distance walked: 63' Assistive device utilized:  None Level of assistance: Complete Independence Comments: no significant deviation or device.  Walks 3-5,000 steps every morning.     TODAY'S TREATMENT  05/17/22 Therapeutic Exercise: Recumbent Bike L4x22mn Supine SLR x 10 bil 2# weight Hooklying clams green TB x 10 bil with TrA brace Supine march with green TB starting with legs extended x 10 Isometric abs in supine 90/90 hips/knees x 20 sec hold Supine DKTC stretch 2x30"  Manual Therapy: STM to bil lumbar paraspinals  05/15/22 TherEx: Rec Bike L3x635m Seated lumbar flexion stretch w/ green yoga ball 3 way 2x30 sec each Seated trunk rotations 10x bil Standing shoulder extensions 2x15 15# BATCA row 25# 2x15  Manual Therapy: STM to bil lumbar paraspinals and QL  05/08/22 Therapeutic Exercise: Rec Bike L4x8m79mPallof press 10# 2x10 Standing lat pull + march green TB x 10 Standing SLS + lat pull green TB 3x10 sec hold each leg  Manual Therapy: STM to bil lumbar paraspinals, QL      PATIENT EDUCATION:  Education details: try placing pillow under side at night to improve comfort.  Person educated: Patient Education method: ExpCustomer service managerucation comprehension: verbalized understanding   HOME EXERCISE PROGRAM: Access Code: [email or web address]:  CLINICAL IMPRESSION: Pt continues to note most issues from UTI which he will see specialist tomorrow for. Continued to work on abdominal activation exercises. He did have some reports of pulling and mild pain in low back with isometric hip exercises. Concluded session with STM to decrease pain and stiffness in low back.    OBJECTIVE IMPAIRMENTS decreased activity tolerance, decreased mobility, decreased strength, increased fascial restrictions, impaired perceived functional ability, increased muscle spasms, improper body mechanics, postural dysfunction, and pain.   ACTIVITY LIMITATIONS carrying, lifting, bending, sitting, standing, sleeping, transfers,  and bed mobility  PARTICIPATION LIMITATIONS: cleaning, laundry, shopping, community activity, and yard work  PERSONAL FACTORS Age, Time since onset of injury/illness/exacerbation, and 3+ comorbidities: recent R TKA, chronic LBP, abdominal surgeries, seizure disorder, memory impairments secondary to CVA  are also affecting patient's functional outcome.   REHAB POTENTIAL: Good  CLINICAL DECISION MAKING: Stable/uncomplicated  EVALUATION COMPLEXITY: Low   GOALS: Goals reviewed with patient? Yes  SHORT TERM GOALS: Target date: 04/17/2022   Patient will be independent with initial HEP.  Baseline: given Goal status: MET   LONG TERM GOALS: Target date: 05/15/2022  extended to 06/15/2022  Patient will be independent with advanced/ongoing HEP to improve outcomes and carryover.  Baseline: needs progression Goal status: IN PROGRESS- 05/08/22 met for current  2.  Patient will report 75% improvement in low back pain to improve QOL.  Baseline: 8-10/10 LBP Goal status: IN PROGRESS - 60-70% improvement 05/08/22  3.  Patient will report 75% improvement in sleep disruption due to low back pain.  Baseline: sleepless nightly 1-2 hours due to pain Goal status: IN PROGRESS  4.  Patient will demonstrate full pain free lumbar ROM to perform ADLs.   Baseline: pain all directions Goal status: IN PROGRESS - 05/08/22 improved, still limited with side bends both sides  5.  Patient will demonstrate improved functional strength as demonstrated by  no stomach bulging with bed mobility. Baseline: core weaknes Goal status: IN PROGRESS -05/17/22 - improved but still bulging noticed  6.  Patient will report 15/50 or less on Oswestry to demonstrate improved functional ability.  Baseline: 20/50 = moderate disability  Goal status: IN PROGRESS   7.  Patient will tolerate 45 min of sitting without increased LBP for traveling, eating meals, etc. Baseline: increased pain after 15 min sitting Goal status: IN  PROGRESS  8.  Patient will be able to transfer from floor to standing safely.  Baseline: unable Goal status: IN PROGRESS    PLAN: PT FREQUENCY: 2x/week  PT DURATION: 6 weeks extended 4 weeks to 06/15/2022  PLANNED INTERVENTIONS: Therapeutic exercises, Therapeutic activity, Neuromuscular re-education, Balance training, Gait training, Patient/Family education, Self Care, Joint mobilization, Stair training, Dry Needling, Electrical stimulation, Spinal mobilization, Moist heat, Taping, Traction, Ultrasound, Manual therapy, and Re-evaluation.  PLAN FOR NEXT SESSION: progress core strengthening exercises focusing on sitting/standing or supine. STM to decrease pain;  Does not tolerate prone positioning.  Manual therapy and modalities PRN.     Artist Pais, PTA 05/17/2022, 8:46 AM

## 2022-05-18 ENCOUNTER — Other Ambulatory Visit (HOSPITAL_BASED_OUTPATIENT_CLINIC_OR_DEPARTMENT_OTHER): Payer: Self-pay

## 2022-05-18 MED ORDER — DOXYCYCLINE HYCLATE 100 MG PO TABS
ORAL_TABLET | ORAL | 0 refills | Status: DC
  Filled 2022-05-18: qty 28, 14d supply, fill #0

## 2022-05-18 MED ORDER — AMOXICILLIN 500 MG PO CAPS
ORAL_CAPSULE | ORAL | 0 refills | Status: DC
  Filled 2022-05-18: qty 16, 4d supply, fill #0

## 2022-05-22 ENCOUNTER — Encounter: Payer: Medicare HMO | Admitting: Physical Therapy

## 2022-05-25 ENCOUNTER — Ambulatory Visit: Payer: Medicare HMO | Admitting: Physical Therapy

## 2022-05-28 ENCOUNTER — Ambulatory Visit: Payer: Medicare HMO

## 2022-05-28 ENCOUNTER — Other Ambulatory Visit (HOSPITAL_BASED_OUTPATIENT_CLINIC_OR_DEPARTMENT_OTHER): Payer: Medicare HMO

## 2022-05-28 ENCOUNTER — Other Ambulatory Visit (HOSPITAL_BASED_OUTPATIENT_CLINIC_OR_DEPARTMENT_OTHER): Payer: Self-pay | Admitting: Urology

## 2022-05-28 ENCOUNTER — Ambulatory Visit (HOSPITAL_BASED_OUTPATIENT_CLINIC_OR_DEPARTMENT_OTHER)
Admission: RE | Admit: 2022-05-28 | Discharge: 2022-05-28 | Disposition: A | Discharge status: Home / Self Care | Payer: Medicare HMO | Source: Ambulatory Visit | Attending: Urology | Admitting: Urology

## 2022-05-28 DIAGNOSIS — R3121 Asymptomatic microscopic hematuria: Secondary | ICD-10-CM | POA: Diagnosis present

## 2022-05-28 MED ORDER — IOHEXOL 300 MG/ML  SOLN
125.0000 mL | Freq: Once | INTRAMUSCULAR | Status: AC | PRN
  Administered 2022-05-28: 125 mL via INTRAVENOUS

## 2022-06-01 ENCOUNTER — Encounter: Payer: Medicare HMO | Admitting: Physical Therapy

## 2022-06-05 ENCOUNTER — Encounter: Payer: Medicare HMO | Admitting: Physical Therapy

## 2022-06-05 ENCOUNTER — Telehealth: Payer: Self-pay | Admitting: Family

## 2022-06-05 ENCOUNTER — Other Ambulatory Visit (HOSPITAL_BASED_OUTPATIENT_CLINIC_OR_DEPARTMENT_OTHER): Payer: Self-pay

## 2022-06-05 MED ORDER — COMIRNATY 30 MCG/0.3ML IM SUSY
PREFILLED_SYRINGE | INTRAMUSCULAR | 0 refills | Status: DC
  Filled 2022-06-05 (×2): qty 0.3, 1d supply, fill #0

## 2022-06-05 NOTE — Telephone Encounter (Signed)
Pt came in office stating went to [facility] to get his covid shot (booster) but pharmacy stated taht he is needing an order from his PCP to get his shot at the pharmacy. Please advise. Pt has [employer].

## 2022-06-05 NOTE — Telephone Encounter (Signed)
FYI to provider.  I have called the pharmacy to get clarification on what the reason for the non-coverage of the vaccine. I have been told that it can be done at a office visit setting but not at the pharmacy since it is an elected vaccine. I was told by the pharmacy that even if we place an order then the insurance will not cover it.   I have called the pt and relayed the information from the pharmacy and explained to him that his insurance will not cover the vaccine and he will have to pay for it out of pocket if they want it. We don't carry the covid vaccines or boosters so we are unable to administrate it to him. I suggest that he call his insurance company and find out why they will not cover it. He stated understanding and thanked me for my help.

## 2022-06-06 ENCOUNTER — Other Ambulatory Visit (HOSPITAL_BASED_OUTPATIENT_CLINIC_OR_DEPARTMENT_OTHER): Payer: Self-pay

## 2022-06-08 ENCOUNTER — Encounter: Payer: Medicare HMO | Admitting: Physical Therapy

## 2022-06-11 ENCOUNTER — Ambulatory Visit (HOSPITAL_COMMUNITY)
Admission: RE | Admit: 2022-06-11 | Discharge: 2022-06-11 | Disposition: A | Discharge status: Home / Self Care | Payer: Medicare HMO | Source: Ambulatory Visit | Attending: Cardiology | Admitting: Cardiology

## 2022-06-11 DIAGNOSIS — I639 Cerebral infarction, unspecified: Secondary | ICD-10-CM | POA: Insufficient documentation

## 2022-06-11 DIAGNOSIS — E782 Mixed hyperlipidemia: Secondary | ICD-10-CM | POA: Diagnosis present

## 2022-06-11 MED ORDER — INCLISIRAN SODIUM 284 MG/1.5ML ~~LOC~~ SOSY
284.0000 mg | PREFILLED_SYRINGE | Freq: Once | SUBCUTANEOUS | Status: AC
  Administered 2022-06-11: 284 mg via SUBCUTANEOUS

## 2022-06-11 MED ORDER — INCLISIRAN SODIUM 284 MG/1.5ML ~~LOC~~ SOSY
PREFILLED_SYRINGE | SUBCUTANEOUS | Status: AC
  Filled 2022-06-11: qty 1.5

## 2022-06-12 ENCOUNTER — Inpatient Hospital Stay (HOSPITAL_BASED_OUTPATIENT_CLINIC_OR_DEPARTMENT_OTHER): Payer: Medicare HMO | Admitting: Certified Registered Nurse Anesthetist

## 2022-06-12 ENCOUNTER — Encounter: Payer: Medicare HMO | Admitting: Physical Therapy

## 2022-06-12 ENCOUNTER — Inpatient Hospital Stay (HOSPITAL_COMMUNITY): Payer: Medicare HMO | Admitting: Certified Registered Nurse Anesthetist

## 2022-06-12 ENCOUNTER — Other Ambulatory Visit (HOSPITAL_BASED_OUTPATIENT_CLINIC_OR_DEPARTMENT_OTHER): Payer: Self-pay

## 2022-06-12 ENCOUNTER — Encounter (HOSPITAL_COMMUNITY)
Admission: RE | Disposition: A | Discharge status: Home / Self Care | Payer: Self-pay | Source: Ambulatory Visit | Attending: Urology

## 2022-06-12 ENCOUNTER — Ambulatory Visit (HOSPITAL_COMMUNITY)
Admission: RE | Admit: 2022-06-12 | Discharge: 2022-06-12 | Disposition: A | Discharge status: Home / Self Care | Payer: Medicare HMO | Source: Ambulatory Visit | Attending: Urology | Admitting: Urology

## 2022-06-12 ENCOUNTER — Inpatient Hospital Stay (HOSPITAL_COMMUNITY): Payer: Medicare HMO

## 2022-06-12 ENCOUNTER — Other Ambulatory Visit: Payer: Self-pay | Admitting: Urology

## 2022-06-12 ENCOUNTER — Encounter (HOSPITAL_COMMUNITY): Payer: Self-pay | Admitting: Urology

## 2022-06-12 DIAGNOSIS — K219 Gastro-esophageal reflux disease without esophagitis: Secondary | ICD-10-CM | POA: Insufficient documentation

## 2022-06-12 DIAGNOSIS — G40909 Epilepsy, unspecified, not intractable, without status epilepticus: Secondary | ICD-10-CM | POA: Diagnosis not present

## 2022-06-12 DIAGNOSIS — Z87891 Personal history of nicotine dependence: Secondary | ICD-10-CM | POA: Diagnosis not present

## 2022-06-12 DIAGNOSIS — N401 Enlarged prostate with lower urinary tract symptoms: Secondary | ICD-10-CM | POA: Insufficient documentation

## 2022-06-12 DIAGNOSIS — N201 Calculus of ureter: Principal | ICD-10-CM | POA: Insufficient documentation

## 2022-06-12 DIAGNOSIS — I2510 Atherosclerotic heart disease of native coronary artery without angina pectoris: Secondary | ICD-10-CM | POA: Diagnosis not present

## 2022-06-12 DIAGNOSIS — N529 Male erectile dysfunction, unspecified: Secondary | ICD-10-CM | POA: Insufficient documentation

## 2022-06-12 DIAGNOSIS — Z951 Presence of aortocoronary bypass graft: Secondary | ICD-10-CM | POA: Insufficient documentation

## 2022-06-12 DIAGNOSIS — I10 Essential (primary) hypertension: Secondary | ICD-10-CM | POA: Insufficient documentation

## 2022-06-12 DIAGNOSIS — Z79899 Other long term (current) drug therapy: Secondary | ICD-10-CM | POA: Diagnosis not present

## 2022-06-12 HISTORY — DX: Personal history of urinary calculi: Z87.442

## 2022-06-12 HISTORY — PX: CYSTOSCOPY/URETEROSCOPY/HOLMIUM LASER/STENT PLACEMENT: SHX6546

## 2022-06-12 SURGERY — CYSTOSCOPY/URETEROSCOPY/HOLMIUM LASER/STENT PLACEMENT
Anesthesia: General | Laterality: Left

## 2022-06-12 MED ORDER — CHLORHEXIDINE GLUCONATE 0.12 % MT SOLN: 15.0000 mL | Freq: Once | OROMUCOSAL | Status: DC

## 2022-06-12 MED ORDER — LIDOCAINE 2% (20 MG/ML) 5 ML SYRINGE
INTRAMUSCULAR | Status: DC | PRN
  Administered 2022-06-12: 40 mg via INTRAVENOUS

## 2022-06-12 MED ORDER — TRAMADOL HCL 50 MG PO TABS
50.0000 mg | ORAL_TABLET | Freq: Every day | ORAL | 0 refills | Status: DC
Start: 2022-06-12 — End: 2022-06-27
  Filled 2022-06-12: qty 15, 15d supply, fill #0

## 2022-06-12 MED ORDER — FENTANYL CITRATE (PF) 100 MCG/2ML IJ SOLN
INTRAMUSCULAR | Status: DC | PRN
  Administered 2022-06-12: 100 ug via INTRAVENOUS

## 2022-06-12 MED ORDER — ONDANSETRON HCL 4 MG/2ML IJ SOLN: 4.0000 mg | Freq: Once | INTRAMUSCULAR | Status: DC | PRN

## 2022-06-12 MED ORDER — FENTANYL CITRATE (PF) 100 MCG/2ML IJ SOLN
INTRAMUSCULAR | Status: AC
  Filled 2022-06-12: qty 2

## 2022-06-12 MED ORDER — FENTANYL CITRATE PF 50 MCG/ML IJ SOSY: 25.0000 ug | PREFILLED_SYRINGE | INTRAMUSCULAR | Status: DC | PRN

## 2022-06-12 MED ORDER — APREPITANT 40 MG PO CAPS: 40.0000 mg | ORAL_CAPSULE | Freq: Once | ORAL | Status: DC

## 2022-06-12 MED ORDER — ACETAMINOPHEN 10 MG/ML IV SOLN: 1000.0000 mg | Freq: Once | INTRAVENOUS | Status: DC | PRN

## 2022-06-12 MED ORDER — DEXAMETHASONE SODIUM PHOSPHATE 10 MG/ML IJ SOLN
INTRAMUSCULAR | Status: DC | PRN
  Administered 2022-06-12: 8 mg via INTRAVENOUS

## 2022-06-12 MED ORDER — PROPOFOL 10 MG/ML IV BOLUS
INTRAVENOUS | Status: DC | PRN
  Administered 2022-06-12: 140 mg via INTRAVENOUS

## 2022-06-12 MED ORDER — DEXMEDETOMIDINE HCL IN NACL 200 MCG/50ML IV SOLN
INTRAVENOUS | Status: DC | PRN
  Administered 2022-06-12: 8 ug via INTRAVENOUS

## 2022-06-12 MED ORDER — PHENYLEPHRINE 80 MCG/ML (10ML) SYRINGE FOR IV PUSH (FOR BLOOD PRESSURE SUPPORT)
PREFILLED_SYRINGE | INTRAVENOUS | Status: DC | PRN
  Administered 2022-06-12 (×2): 160 ug via INTRAVENOUS
  Administered 2022-06-12: 80 ug via INTRAVENOUS
  Administered 2022-06-12 (×2): 160 ug via INTRAVENOUS

## 2022-06-12 MED ORDER — PHENYLEPHRINE 80 MCG/ML (10ML) SYRINGE FOR IV PUSH (FOR BLOOD PRESSURE SUPPORT)
PREFILLED_SYRINGE | INTRAVENOUS | Status: AC
  Filled 2022-06-12: qty 20

## 2022-06-12 MED ORDER — PROPOFOL 10 MG/ML IV BOLUS
INTRAVENOUS | Status: AC
  Filled 2022-06-12: qty 20

## 2022-06-12 MED ORDER — SODIUM CHLORIDE 0.9 % IR SOLN
Status: DC | PRN
  Administered 2022-06-12: 3000 mL

## 2022-06-12 MED ORDER — ONDANSETRON HCL 4 MG/2ML IJ SOLN
INTRAMUSCULAR | Status: DC | PRN
  Administered 2022-06-12: 4 mg via INTRAVENOUS

## 2022-06-12 MED ORDER — CEFAZOLIN SODIUM-DEXTROSE 2-4 GM/100ML-% IV SOLN
2.0000 g | INTRAVENOUS | Status: AC
  Administered 2022-06-12: 2 g via INTRAVENOUS
  Filled 2022-06-12: qty 100

## 2022-06-12 MED ORDER — LACTATED RINGERS IV SOLN: INTRAVENOUS | Status: DC

## 2022-06-12 SURGICAL SUPPLY — 23 items
BAG URO CATCHER STRL LF (MISCELLANEOUS) ×1 IMPLANT
BASKET LASER NITINOL 1.9FR (BASKET) IMPLANT
BASKET ZERO TIP NITINOL 2.4FR (BASKET) IMPLANT
CATH URETERAL DUAL LUMEN 10F (MISCELLANEOUS) IMPLANT
CATH URETL OPEN END 6FR 70 (CATHETERS) ×1 IMPLANT
CLOTH BEACON ORANGE TIMEOUT ST (SAFETY) ×1 IMPLANT
EXTRACTOR STONE 1.7FRX115CM (UROLOGICAL SUPPLIES) IMPLANT
GLOVE BIO SURGEON STRL SZ7.5 (GLOVE) ×1 IMPLANT
GOWN STRL REUS W/ TWL XL LVL3 (GOWN DISPOSABLE) ×1 IMPLANT
GOWN STRL REUS W/TWL XL LVL3 (GOWN DISPOSABLE) ×1
GUIDEWIRE ANG ZIPWIRE 038X150 (WIRE) IMPLANT
GUIDEWIRE STR DUAL SENSOR (WIRE) ×1 IMPLANT
KIT TURNOVER KIT A (KITS) IMPLANT
LASER FIB FLEXIVA PULSE ID 365 (Laser) IMPLANT
MANIFOLD NEPTUNE II (INSTRUMENTS) ×1 IMPLANT
PACK CYSTO (CUSTOM PROCEDURE TRAY) ×1 IMPLANT
SHEATH NAVIGATOR HD 11/13X28 (SHEATH) IMPLANT
SHEATH NAVIGATOR HD 11/13X36 (SHEATH) IMPLANT
STENT POLARIS LOOP 6FR X 26 CM (STENTS) IMPLANT
TRACTIP FLEXIVA PULS ID 200XHI (Laser) IMPLANT
TRACTIP FLEXIVA PULSE ID 200 (Laser)
TUBING CONNECTING 10 (TUBING) ×1 IMPLANT
TUBING UROLOGY SET (TUBING) ×1 IMPLANT

## 2022-06-12 NOTE — Anesthesia Preprocedure Evaluation (Addendum)
Anesthesia Evaluation  Patient identified by MRN, date of birth, ID band Patient awake    Reviewed: Allergy & Precautions, NPO status , Patient's Chart, lab work & pertinent test results  History of Anesthesia Complications (+) PONV, DIFFICULT AIRWAY and history of anesthetic complications  Airway Mallampati: II  TM Distance: >3 FB Neck ROM: Full    Dental no notable dental hx. (+) Teeth Intact, Dental Advisory Given   Pulmonary former smoker,    Pulmonary exam normal breath sounds clear to auscultation       Cardiovascular hypertension, + CAD and + CABG  Normal cardiovascular exam+ Valvular Problems/Murmurs  Rhythm:Regular Rate:Normal  11/2020 ECHO   1. Left ventricular ejection fraction, by estimation, is 55 to 60%. Left  ventricular ejection fraction by 3D volume is 60 %. The left ventricle has  normal function. The left ventricle has no regional wall motion  abnormalities. There is mild asymmetric  left ventricular hypertrophy of the basal-septal segment. Left ventricular  diastolic parameters are consistent with Grade I diastolic dysfunction  (impaired relaxation). The average left ventricular global longitudinal  strain is -20.1 %. The global  longitudinal strain is normal.  2. Right ventricular systolic function is mildly reduced. The right  ventricular size is mildly enlarged. There is normal pulmonary artery  systolic pressure. The estimated right ventricular systolic pressure is  53.9 mmHg.  3. Mild-to-moderate leaflet calcification of the mitral valve leaflet(s).  4. The mitral valve is normal in structure. Trivial mitral valve  regurgitation. No evidence of mitral stenosis.  5. The aortic valve has been repaired/replaced. There is a 23 mm bovine  valve present in the aortic position. Procedure Date: 01/04/2016. Echo  findings are consistent with normal structure and function of the aortic  valve prosthesis.  Aortic valve  regurgitation is not visualized. No aortic valve stenosis. Mean gradient  59mHg; DI 0.64  6. Aortic root/ascending aorta has been repaired/replaced s/p bentall  procedure in 12/2015. Ascending aorta is normal in size measuring 3.1cm.  There is mild dilatation of the aortic root, measuring 38 mm.  7. The inferior vena cava is normal in size with greater than 50%  respiratory variability, suggesting right atrial pressure of 3 mmHg.   Comparison(s): 09/24/17 EF 60-65%. PA pressure 329mg. AV 16m31m mean PG,  81m316mpeak PG.   FINDINGS  Left Ventricle: Left ventricular ejection fraction, by estimation, is 55  to 60%. Left ventricular ejection fraction by 3D volume is 60 %. The left  ventricle has normal function. The left ventricle has no regional wall  motion abnormalities. The average  left ventricular global longitudinal strain is -20.1 %. The global  longitudinal strain is normal. The left ventricular internal cavity size  was normal in size. There is mild asymmetric left ventricular hypertrophy  of the basal-septal segment. Abnormal  (paradoxical) septal motion consistent with post-operative status. Left  ventricular diastolic parameters are consistent with Grade I diastolic  dysfunction (impaired relaxation).   Right Ventricle: The right ventricular size is mildly enlarged. No  increase in right ventricular wall thickness. Right ventricular systolic  function is mildly reduced. There is normal pulmonary artery systolic  pressure. The tricuspid regurgitant velocity  is 2.82 m/s, and with an assumed right atrial pressure of 3 mmHg, the  estimated right ventricular systolic pressure is 34.876.7g.    Neuro/Psych Seizures -, Well Controlled,  CVA    GI/Hepatic GERD  ,  Endo/Other    Renal/GU Lab Results      Component  Value               Date                      CREATININE               1.08                05/07/2022               K                         4.4                 05/07/2022                   Musculoskeletal  (+) Arthritis ,   Abdominal   Peds  Hematology Lab Results      Component                Value               Date                      WBC                      8.2                 05/07/2022                HGB                      14.5                05/07/2022                HCT                      44.3                05/07/2022                MCV                      82.5                05/07/2022                PLT                      258.0               05/07/2022              Anesthesia Other Findings All: Oxycodone  Reproductive/Obstetrics                           Anesthesia Physical Anesthesia Plan  ASA: 3  Anesthesia Plan: General   Post-op Pain Management:    Induction: Intravenous  PONV Risk Score and Plan: 3 and Treatment may vary due to age or medical condition, Midazolam and Ondansetron  Airway Management Planned: LMA  Additional Equipment: None  Intra-op Plan:   Post-operative Plan:   Informed Consent: I have reviewed the patients History and Physical, chart, labs and discussed the procedure including the risks, benefits and alternatives for  the proposed anesthesia with the patient or authorized representative who has indicated his/her understanding and acceptance.     Dental advisory given  Plan Discussed with:   Anesthesia Plan Comments:        Anesthesia Quick Evaluation

## 2022-06-12 NOTE — H&P (Signed)
CC/HPI: Erik Salazar is a 71 year old male seen in follow-up for prostate cancer screening, BPH with LUTS, erectile dysfunction as well as a complex left lower pole cyst.   His PSA 09/09/2019 was normal at 0.22 NG/mL. He presents for PSA value today. DRE today is benign, 40 grams.   He has mild lower urinary tract symptoms which are well controlled with 0.4 tamsulosin daily. He is a sensation of intermittency and 1 time nocturia. His IPSS score is 2. He continues to tolerate tamsulosin well.   Complains of erectile dysfunction with difficulty obtaining and maintaining erection without the aid of medical therapy. He has tried tadalafil in the past however did not tolerate this due to side effects. Currently, when he takes sildenafil 60 mg, he develops dizziness and headache which are bothersome to him. He is interested in trying intracavernosal injections. He is not interested in trying IPP at this time.   He does have a history of a complex left renal cyst. CT A/P 09/03/2019 revealed 1.3 cm partially exophytic lesion in the left lower pole. That study demonstrated no contrast enhancement in the read states no routine follow-up needed.   Patient currently denies fever, chills, sweats, nausea, vomiting, abdominal or flank pain, gross hematuria or dysuria.   07/18/2021  Patient underwent decompressive laminectomy and foraminotomies of L5/S1 as well as fusion of L5/S1 and fixation of L5/S1 on 06/28/2021. He did have a Foley catheter and this was removed the following day. Since this time, he has been having an increase in voiding complaints including difficulty postponing urination, incontinence, frequency, urgency, straining to void, weak stream. PVR is 387.   05/18/2022  Patient performed clean intermittent catheterization a few times and started tamsulosin 0.8 mg daily. After catheterizing a few times, his retention resolved. PVR 0. Continues on tamsulosin 0.8 mg. Would like to decrease that dosage. Over  the past 2 weeks, he has had an increase in urinary frequency. No dysuria or gross hematuria. He does have microscopic hematuria today. He had microscopic hematuria at the last visit but did not follow-up as planned 2 weeks later for recheck as well as PVR. He has had some weight loss over the past couple of months. About 20 pounds. He denies a history nephrolithiasis. PSA on 05/07/2022 was 0.17. Hemoglobin A1c 6.2. Creatinine 1.08 with a GFR of 69.   06/12/2022  Patient presents today after undergoing a CT hematuria protocol. He was found to have a 5 mm left ureterovesicular junction calculus with mild ureteral dilatation. He has been having some severe left-sided flank pain. Stone still visible on KUB. Pain is persistent. Urinalysis negative. He has never had procedural intervention for stone. This is his first stone.     ALLERGIES: oxycodone Oxycontin Percocet sulfa    MEDICATIONS: Aspirin 325 mg tablet  Metoprolol Tartrate  Tamsulosin Hcl 0.4 mg capsule 2 capsule PO Daily  Levetiracetam 750 mg tablet  Pravastatin Sodium 40 mg tablet     GU PSH: Locm 300-'399Mg'$ /Ml Iodine,1Ml - 2021       PSH Notes: Spinal Surgery   NON-GU PSH: Aortic valve replacement (mechanical) CABG (coronary artery bypass grafting) Hernia Repair Knee replacement, Left Rotator cuff surgery         GU PMH: BPH w/LUTS - 05/18/2022, - 07/18/2021, - 2022, - 2019, - 2018 Microscopic hematuria (Stable) - 05/18/2022, - 07/18/2021 Urinary Frequency - 05/18/2022, - 07/18/2021 Incomplete bladder emptying - 07/18/2021 Nocturia (Stable) - 07/18/2021, - 2019 Overflow Incontinence - 07/18/2021 Splitting of urinary stream -  07/18/2021 Straining on Urination - 07/18/2021 Urinary Retention - 07/18/2021 Urinary Urgency - 07/18/2021 Weak Urinary Stream - 07/18/2021 ED due to arterial insufficiency - 2022, - 2019 Encounter for Prostate Cancer screening - 2022, - 2021 Renal cyst - 2022, - 0981 Left uncertain neoplasm of  kidney - 2020    NON-GU PMH: Arthritis Cardiac murmur, unspecified DVT, History Glaucoma Hypercholesterolemia Seizure disorder Stroke/TIA    FAMILY HISTORY: Aneurysm - Mother, Father   SOCIAL HISTORY: Marital Status: Married Preferred Language: English; Race: White Current Smoking Status: Patient has never smoked.  <DIV'  Tobacco Use Assessment Completed:  Used Tobacco in last 30 days?   Does not drink caffeine.     Notes: 3 daughters    REVIEW OF SYSTEMS:     GU Review Male:  Patient denies frequent urination, hard to postpone urination, burning/ pain with urination, get up at night to urinate, leakage of urine, stream starts and stops, trouble starting your stream, have to strain to urinate , erection problems, and penile pain.    Gastrointestinal (Upper):  Patient denies nausea, vomiting, and indigestion/ heartburn.    Gastrointestinal (Lower):  Patient denies diarrhea and constipation.    Constitutional:  Patient denies fever, night sweats, weight loss, and fatigue.    Skin:  Patient denies skin rash/ lesion and itching.    Eyes:  Patient denies blurred vision and double vision.    Ears/ Nose/ Throat:  Patient denies sore throat and sinus problems.    Hematologic/Lymphatic:  Patient denies swollen glands and easy bruising.    Cardiovascular:  Patient denies leg swelling and chest pains.    Respiratory:  Patient denies cough and shortness of breath.    Endocrine:  Patient denies excessive thirst.    Musculoskeletal:  Patient denies back pain and joint pain.    Neurological:  Patient denies headaches and dizziness.    Psychologic:  Patient denies depression and anxiety.    VITAL SIGNS: None     MULTI-SYSTEM PHYSICAL EXAMINATION:      Constitutional: Well-nourished. No physical deformities. Normally developed. Good grooming.            Complexity of Data:   Source Of History:  Patient  Records Review:  Previous Doctor Records, Previous Patient Records  Urine Test  Review:  Urinalysis    09/08/20 09/10/19  PSA  Total PSA 0.11 ng/mL 0.222 ng/mL    PROCEDURES:    KUB - 74018  A single view of the abdomen is obtained.  Stone is persistent on KUB      Patient confirmed No Neulasta OnPro Device.      Urinalysis  Dipstick Dipstick Cont'd  Color: Yellow Bilirubin: Neg mg/dL  Appearance: Clear Ketones: Neg mg/dL  Specific Gravity: 1.020 Blood: Neg ery/uL  pH: 6.0 Protein: Neg mg/dL  Glucose: Neg mg/dL Urobilinogen: 0.2 mg/dL   Nitrites: Neg   Leukocyte Esterase: Neg leu/uL    ASSESSMENT:     ICD-10 Details  1 GU:  Ureteral calculus - N20.1 Undiagnosed New Problem  2  Ureteral obstruction secondary to calculous - N13.2 Undiagnosed New Problem  3  Renal colic - X91 Undiagnosed New Problem   PLAN:   Orders  X-Rays: KUB  Schedule    Document  Letter(s):  Created for Patient: Clinical Summary   Notes:  We discussed the management of urinary stones. These options include observation, ureteroscopy, and shockwave lithotripsy. We discussed which options are relevant to these particular stones. We discussed the natural history of stones as  well as the complications of untreated stones and the impact on quality of life without treatment as well as with each of the above listed treatments. We also discussed the efficacy of each treatment in its ability to clear the stone burden. With any of these management options I discussed the signs and symptoms of infection and the need for emergent treatment should these be experienced. For each option we discussed the ability of each procedure to clear the patient of their stone burden.   For observation I described the risks which include but are not limited to silent renal damage, life-threatening infection, need for emergent surgery, failure to pass stone, and pain.   For ureteroscopy I described the risks which include heart attack, stroke, pulmonary embolus, death, bleeding, infection, damage to  contiguous structures, positioning injury, ureteral stricture, ureteral avulsion, ureteral injury, need for ureteral stent, inability to perform ureteroscopy, need for an interval procedure, inability to clear stone burden, stent discomfort and pain.   For shockwave lithotripsy I described the risks which include arrhythmia, kidney contusion, kidney hemorrhage, need for transfusion, pain, inability to break up stone, inability to pass stone fragments, Steinstrasse, infection associated with obstructing stones, need for different surgical procedure, need for repeat shockwave lithotripsy.   He would like to proceed with ureteroscopy. Risk and benefits discussed.   CC: Dr. Olen Pel   Signed by Link Snuffer, III, M.D. on 06/12/22 at 8:46 AM (EDT

## 2022-06-12 NOTE — Discharge Instructions (Addendum)
[facility] [phone] Post Ureteroscopy With or Without Stent Instructions  Tramadol was sent to your pharmacy from the office.  Definitions:  Ureter: The duct that transports urine from the kidney to the bladder. Stent:   A plastic hollow tube that is placed into the ureter, from the kidney to the                 bladder to prevent the ureter from swelling shut.  GENERAL INSTRUCTIONS:  Despite the fact that no skin incisions were used, the area around the ureter and bladder is raw and irritated. The stent is a foreign body which will further irritate the bladder wall. This irritation is manifested by increased frequency of urination, both day and night, and by an increase in the urge to urinate. In some, the urge to urinate is present almost always. Sometimes the urge is strong enough that you may not be able to stop yourself from urinating. The only real cure is to remove the stent and then give time for the bladder wall to heal which can't be done until the danger of the ureter swelling shut has passed, which varies.  You may see some blood in your urine while the stent is in place and a few days afterwards. Do not be alarmed, even if the urine was clear for a while. Get off your feet and drink lots of fluids until clearing occurs. If you start to pass clots or don't improve, call us.  DIET: You may return to your normal diet immediately. Because of the raw surface of your bladder, alcohol, spicy foods, acid type foods and drinks with caffeine may cause irritation or frequency and should be used in moderation. To keep your urine flowing freely and to avoid constipation, drink plenty of fluids during the day ( 8-10 glasses ). Tip: Avoid cranberry juice because it is very acidic.  ACTIVITY: Your physical activity doesn't need to be restricted. However, if you are very active, you may see some blood in your urine. We suggest that you reduce your activity under these  circumstances until the bleeding has stopped.  BOWELS: It is important to keep your bowels regular during the postoperative period. Straining with bowel movements can cause bleeding. A bowel movement every other day is reasonable. Use a mild laxative if needed, such as Milk of Magnesia 2-3 tablespoons, or 2 Dulcolax tablets. Call if you continue to have problems. If you have been taking narcotics for pain, before, during or after your surgery, you may be constipated. Take a laxative if necessary.   MEDICATION: You should resume your pre-surgery medications unless told not to. You may take oxybutynin or flomax if prescribed for bladder spasms or discomfort from the stent Take pain medication as directed for pain refractory to conservative management  PROBLEMS YOU SHOULD REPORT TO [place]: Fevers over 100.5 Fahrenheit. Heavy bleeding, or clots ( See above notes about blood in urine ). Inability to urinate. Drug reactions ( hives, rash, nausea, vomiting, diarrhea ). Severe burning or pain with urination that is not improving.

## 2022-06-12 NOTE — Op Note (Signed)
Operative Note  Preoperative diagnosis:  1.  Left ureteral calculus  Postoperative diagnosis: 1.  Left ureteral calculus  Procedure(s): 1.  Cystoscopy with left retrograde pyelogram, left ureteroscopy with laser lithotripsy and stone basketing, left ureteral stent placement  Surgeon: Link Snuffer, MD  Assistants: None  Anesthesia: General  Complications: None immediate  EBL: Minimal  Specimens: 1.  None  Drains/Catheters: 1.  6 x 26 double-J ureteral stent  Intraoperative findings: 1.  Normal anterior urethra 2.  Borderline obstructing prostate 3.  Normal bladder mucosa without any tumors or masses. 4.  5 mm distal left ureteral calculus fragmented and basket extracted.  Retrograde pyelogram revealed some mild hydronephrosis.  No filling defect after treatment of the stone.  Indication: 71 year old male with a distal left ureteral calculus presents for previously mentioned operation.  Description of procedure:  The patient was identified and consent was obtained.  The patient was taken to the operating room and placed in the supine position.  The patient was placed under general anesthesia.  Perioperative antibiotics were administered.  The patient was placed in dorsal lithotomy.  Patient was prepped and draped in a standard sterile fashion and a timeout was performed.  A 21 French rigid cystoscope was advanced into the urethra and into the bladder.  Complete cystoscopy was performed with findings noted above.  A sensor wire was advanced up the left ureter and into the kidney under fluoroscopic guidance.  A semirigid ureteroscope was advanced alongside the wire up to the stone which was laser fragmented to smaller fragments followed by basket extraction.  I readvanced the scope up the ureter and up to the renal pelvis.  No other stones were seen.  I shot a retrograde pyelogram through the scope with the findings noted above.  I withdrew the scope visualizing the ureter upon  removal.  There were no ureteral calculi and no ureteral injury was seen.  I backloaded the wire onto rigid cystoscope and advanced that into the bladder followed by routine placement of a 6 x 26 double-J ureteral stent.  Fluoroscopy confirmed proximal placement and direct visualization confirmed a good coil within the bladder.  I drained the bladder and withdrew the scope.  Patient tolerated the procedure well stable postoperative.  Plan: Follow-up in 1 week for stent removal

## 2022-06-12 NOTE — Anesthesia Procedure Notes (Signed)
Procedure Name: LMA Insertion Date/Time: 06/12/2022 6:16 PM  Performed by: Gean Maidens, CRNAPre-anesthesia Checklist: Patient identified, Emergency Drugs available, Suction available, Patient being monitored and Timeout performed Patient Re-evaluated:Patient Re-evaluated prior to induction Oxygen Delivery Method: Circle system utilized Preoxygenation: Pre-oxygenation with 100% oxygen Induction Type: IV induction Ventilation: Mask ventilation without difficulty LMA: LMA inserted LMA Size: 4.0 Number of attempts: 1 Placement Confirmation: positive ETCO2 and breath sounds checked- equal and bilateral Tube secured with: Tape Dental Injury: Teeth and Oropharynx as per pre-operative assessment

## 2022-06-12 NOTE — Transfer of Care (Signed)
Immediate Anesthesia Transfer of Care Note  Patient: Erik Salazar  Procedure(s) Performed: CYSTOSCOPY/URETEROSCOPY/HOLMIUM LASER/STENT PLACEMENT (Left)  Patient Location: PACU  Anesthesia Type:General  Level of Consciousness: drowsy and patient cooperative  Airway & Oxygen Therapy: Patient Spontanous Breathing and Patient connected to face mask oxygen  Post-op Assessment: Report given to RN and Post -op Vital signs reviewed and stable  Post vital signs: Reviewed and stable  Last Vitals:  Vitals Value Taken Time  BP 168/78 06/12/22 1909  Temp    Pulse 84 06/12/22 1910  Resp 13 06/12/22 1910  SpO2 100 % 06/12/22 1910  Vitals shown include unvalidated device data.  Last Pain:  Vitals:   06/12/22 1658  TempSrc: Oral  PainSc: 0-No pain         Complications: No notable events documented.

## 2022-06-13 ENCOUNTER — Encounter (HOSPITAL_COMMUNITY): Payer: Self-pay | Admitting: Urology

## 2022-06-13 NOTE — Anesthesia Postprocedure Evaluation (Signed)
Anesthesia Post Note  Patient: Erik Salazar  Procedure(s) Performed: CYSTOSCOPY/URETEROSCOPY/HOLMIUM LASER/STENT PLACEMENT (Left)     Patient location during evaluation: PACU Anesthesia Type: General Level of consciousness: awake and alert Pain management: pain level controlled Vital Signs Assessment: post-procedure vital signs reviewed and stable Respiratory status: spontaneous breathing, nonlabored ventilation, respiratory function stable and patient connected to nasal cannula oxygen Cardiovascular status: blood pressure returned to baseline and stable Postop Assessment: no apparent nausea or vomiting Anesthetic complications: no   No notable events documented.  Last Vitals:  Vitals:   06/12/22 1945 06/12/22 2020  BP: (!) 158/82 (!) 142/70  Pulse: 71 68  Resp: 19 17  Temp: 36.6 C 36.7 C  SpO2: 98% 99%    Last Pain:  Vitals:   06/12/22 2020  TempSrc:   PainSc: 0-No pain                 Barnet Glasgow

## 2022-06-15 ENCOUNTER — Encounter: Payer: Medicare HMO | Admitting: Physical Therapy

## 2022-06-18 ENCOUNTER — Encounter: Payer: Self-pay | Admitting: Neurology

## 2022-06-19 ENCOUNTER — Encounter: Payer: Medicare HMO | Admitting: Physical Therapy

## 2022-06-20 ENCOUNTER — Ambulatory Visit: Payer: Medicare HMO | Admitting: Neurology

## 2022-06-23 ENCOUNTER — Emergency Department (HOSPITAL_BASED_OUTPATIENT_CLINIC_OR_DEPARTMENT_OTHER): Payer: Medicare HMO

## 2022-06-23 ENCOUNTER — Other Ambulatory Visit: Payer: Self-pay

## 2022-06-23 ENCOUNTER — Emergency Department (HOSPITAL_BASED_OUTPATIENT_CLINIC_OR_DEPARTMENT_OTHER)
Admission: EM | Admit: 2022-06-23 | Discharge: 2022-06-23 | Disposition: A | Discharge status: Home / Self Care | Payer: Medicare HMO | Attending: Emergency Medicine | Admitting: Emergency Medicine

## 2022-06-23 ENCOUNTER — Encounter (HOSPITAL_BASED_OUTPATIENT_CLINIC_OR_DEPARTMENT_OTHER): Payer: Self-pay | Admitting: Emergency Medicine

## 2022-06-23 DIAGNOSIS — R109 Unspecified abdominal pain: Secondary | ICD-10-CM | POA: Insufficient documentation

## 2022-06-23 DIAGNOSIS — K5900 Constipation, unspecified: Secondary | ICD-10-CM | POA: Insufficient documentation

## 2022-06-23 DIAGNOSIS — Z7982 Long term (current) use of aspirin: Secondary | ICD-10-CM | POA: Diagnosis not present

## 2022-06-23 DIAGNOSIS — Z955 Presence of coronary angioplasty implant and graft: Secondary | ICD-10-CM | POA: Diagnosis not present

## 2022-06-23 DIAGNOSIS — I2510 Atherosclerotic heart disease of native coronary artery without angina pectoris: Secondary | ICD-10-CM | POA: Insufficient documentation

## 2022-06-23 DIAGNOSIS — Z96653 Presence of artificial knee joint, bilateral: Secondary | ICD-10-CM | POA: Diagnosis not present

## 2022-06-23 DIAGNOSIS — K6289 Other specified diseases of anus and rectum: Secondary | ICD-10-CM | POA: Diagnosis not present

## 2022-06-23 LAB — BASIC METABOLIC PANEL
Anion gap: 9 (ref 5–15)
BUN: 13 mg/dL (ref 8–23)
CO2: 22 mmol/L (ref 22–32)
Calcium: 8.7 mg/dL — ABNORMAL LOW (ref 8.9–10.3)
Chloride: 101 mmol/L (ref 98–111)
Creatinine, Ser: 0.89 mg/dL (ref 0.61–1.24)
GFR, Estimated: >60 mL/min (ref >60)
Glucose, Bld: 118 mg/dL — ABNORMAL HIGH (ref 70–99)
Potassium: 4.1 mmol/L (ref 3.5–5.1)
Sodium: 132 mmol/L — ABNORMAL LOW (ref 135–145)

## 2022-06-23 LAB — CBC WITH DIFFERENTIAL/PLATELET
Abs Immature Granulocytes: 0.06 10*3/uL (ref 0.00–0.07)
Basophils Absolute: 0.1 10*3/uL (ref 0.0–0.1)
Basophils Relative: 0 %
Eosinophils Absolute: 0.1 10*3/uL (ref 0.0–0.5)
Eosinophils Relative: 1 %
HCT: 42.9 % (ref 39.0–52.0)
Hemoglobin: 14.3 g/dL (ref 13.0–17.0)
Immature Granulocytes: 1 %
Lymphocytes Relative: 16 %
Lymphs Abs: 1.8 10*3/uL (ref 0.7–4.0)
MCH: 27.6 pg (ref 26.0–34.0)
MCHC: 33.3 g/dL (ref 30.0–36.0)
MCV: 82.7 fL (ref 80.0–100.0)
Monocytes Absolute: 0.8 10*3/uL (ref 0.1–1.0)
Monocytes Relative: 7 %
Neutro Abs: 8.8 10*3/uL — ABNORMAL HIGH (ref 1.7–7.7)
Neutrophils Relative %: 75 %
Platelets: 305 10*3/uL (ref 150–400)
RBC: 5.19 MIL/uL (ref 4.22–5.81)
RDW: 14.5 % (ref 11.5–15.5)
WBC: 11.6 10*3/uL — ABNORMAL HIGH (ref 4.0–10.5)
nRBC: 0 % (ref 0.0–0.2)

## 2022-06-23 LAB — URINALYSIS, ROUTINE W REFLEX MICROSCOPIC
Bilirubin Urine: NEGATIVE
Glucose, UA: NEGATIVE mg/dL
Hgb urine dipstick: NEGATIVE
Ketones, ur: NEGATIVE mg/dL
Leukocytes,Ua: NEGATIVE
Nitrite: NEGATIVE
Protein, ur: NEGATIVE mg/dL
Specific Gravity, Urine: 1.02 (ref 1.005–1.030)
pH: 6.5 (ref 5.0–8.0)

## 2022-06-23 MED ORDER — MILK AND MOLASSES ENEMA: 1.0000 | Freq: Once | RECTAL | Status: DC

## 2022-06-23 MED ORDER — IOHEXOL 300 MG/ML  SOLN
80.0000 mL | Freq: Once | INTRAMUSCULAR | Status: AC | PRN
  Administered 2022-06-23: 80 mL via INTRAVENOUS

## 2022-06-23 NOTE — ED Provider Notes (Signed)
I have personally viewed/interpreted the CT images.  Significant constipation but no bowel obstruction.  Discussed results with patient.  Per Dr. Neomia Dear report, no obvious fecal impaction or at least nothing that could be manually disimpacted.  Patient was offered enema but has an enema at home and prefers to do it at home and be discharged currently.  I think this is pretty reasonable and we discussed return precautions.  Discussed other remedies such as MiraLAX, Colace, oral hydration.  Given return precautions.   Sherwood Gambler, MD 06/23/22 [phone]

## 2022-06-23 NOTE — ED Triage Notes (Signed)
Pt reports severe rectal pain after pushing to try to have a BM after taking a suppository; last BM 1 1/2 weeks ago; recent stent removed s/p kidney stone removal

## 2022-06-23 NOTE — Discharge Instructions (Signed)
Your CT scanning today shows significant constipation.  You may use an enema at home but also should continue to use MiraLAX, Colace, and drink plenty of fluids.  If develop new or worsening rectal pain, bleeding, vomiting, abdominal pain, or any other new/concerning symptoms then return to the ER for evaluation.

## 2022-06-23 NOTE — ED Provider Notes (Signed)
[facility] EMERGENCY DEPARTMENT Provider Note   CSN: [id number] Arrival date & time: 06/23/22  1344     History  Chief Complaint  Patient presents with   Rectal Pain    Kaiser Gosch is a 71 y.o. male.  HPI Patient presents with constipation.  Rectal pain.  Reportedly has not had a good bowel movement around a week and a half.  Recently had ureteral stent removed.  States really not having bowel movement since.  States has had some bowel movements at times but they were just small little pieces.  Feels if he has to go and also feels if there is pain in the abdomen.  No nausea or vomiting.  Has been taking laxatives without relief.  Also has a suppository and only had small amount of stool.   Past Medical History:  Diagnosis Date   Arthritis    RA and OA   Carpal tunnel syndrome on right 07/08/2017   Cervical radiculopathy at C6 40/98/1191   Right   Complication of anesthesia    Coronary artery disease    Enlarged prostate    Fibromyalgia    H/O: knee surgery    15    History of blood clots    History of kidney stones    History of open heart surgery    ASCENDING AORTIC ANEURYSM   Ischemic stroke of frontal lobe ([place]) 03/14/2017   Bilateral; post-redo CT surgery   Pneumonia    PONV (postoperative nausea and vomiting)    only after CABG surgeries   Seizure disorder ([place]) 03/14/2017   Seizures ([place])    Status post knee surgery    DVT POST KNEE SURGERY   Stroke [facility])     Past Surgical History:  Procedure Laterality Date   ANTERIOR CERVICAL DECOMP/DISCECTOMY FUSION N/A 05/04/2020   Procedure: Anterior Cervical Decompression/Discectomy Fuion Cervical three-four, Cervical four-five, Cervical five-six;  Surgeon: Kary Kos, MD;  Location: [place];  Service: Neurosurgery;  Laterality: N/A;   BALLOON DILATION N/A 06/23/2019   Procedure: BALLOON DILATION;  Surgeon: Otis Brace, MD;  [facility];  Service: Gastroenterology;  Laterality: N/A;    BENTALL PROCEDURE  01/04/2016   Bentall with 23 mm pericardial AVR; SVG-LAD, SVG-CX ([place])   BICEPS TENDON REPAIR Right    BIOPSY  06/23/2019   Procedure: BIOPSY;  Surgeon: Otis Brace, MD;  [facility];  Service: Gastroenterology;;   CARDIAC SURGERY     ANUERSYM MAY 2017   Bentall procedure. Bioprosthetic aortic valve #23 mm bovine model #2700 TF ask, and 28 mm Gelweave woven vascular sinus of Valsalva graft   CARPAL TUNNEL RELEASE  08/2018   right hand    CORONARY ARTERY BYPASS GRAFT  01/04/2016   VG to LAD & VG to LCX   CORONARY ARTERY BYPASS GRAFT  03/13/2017   LIMA to LAD with steril abcess removal from dacron graft   CYSTOSCOPY/URETEROSCOPY/HOLMIUM LASER/STENT PLACEMENT Left 06/12/2022   Procedure: CYSTOSCOPY/URETEROSCOPY/HOLMIUM LASER/STENT PLACEMENT;  Surgeon: Lucas Mallow, MD;  [facility];  Service: Urology;  Laterality: Left;   ESOPHAGOGASTRODUODENOSCOPY     Had done dilatation done about 2 or 3 times before in [place]   ESOPHAGOGASTRODUODENOSCOPY (EGD) WITH PROPOFOL N/A 06/23/2019   Procedure: ESOPHAGOGASTRODUODENOSCOPY (EGD) WITH PROPOFOL;  Surgeon: Otis Brace, MD;  [facility];  Service: Gastroenterology;  Laterality: N/A;   FALSE ANEURYSM REPAIR  03/13/2017   redo sternotomy, sterile abscess removal from Dacron graft, omental flap around aorta, CABG:  LIMA-LAD ([place], Dr. Mart Piggs)   [place]  2018   Of pericardium 2018   INSERTION OF MESH  10/14/2020   Procedure: INSERTION OF MESH;  Surgeon: Michael Boston, MD;  Location: [facility];  Service: General;;   knee surgeries     13 surgeries on knee done before knee replacement    [place] N/A 05/19/2021   Procedure: LYSIS OF ADHESIONS;  Surgeon: Michael Boston, MD;  Location: [facility];  Service: General;  Laterality: N/A;  GEN & LOCAL   LYSIS OF ADHESION N/A 10/14/2020   Procedure: LYSIS OF ADHESION;   Surgeon: Michael Boston, MD;  Location: [facility];  Service: General;  Laterality: N/A;   open heart surgery     03-13-2017   REPLACEMENT TOTAL KNEE Left 2015   ROTATOR CUFF REPAIR Right    done with biceps tendon repair   TONSILLECTOMY     removed as a child.   TOTAL KNEE ARTHROPLASTY Right 01/18/2022   Procedure: RIGHT TOTAL KNEE ARTHROPLASTY;  Surgeon: Leandrew Koyanagi, MD;  Location: [place];  Service: Orthopedics;  Laterality: Right;   VENTRAL HERNIA REPAIR N/A 10/14/2020   Procedure: LAPAROSCOPIC VENTRAL HERNIA REPAIR WITH TAP BLOCK BILATERAL;  Surgeon: Michael Boston, MD;  Location: [facility];  Service: General;  Laterality: N/A;   VENTRAL HERNIA REPAIR N/A 05/19/2021   Procedure: LAPAROSCOPIC VENTRAL WALL HERNIA REPAIR;  Surgeon: Michael Boston, MD;  Location: [place];  Service: General;  Laterality: N/A;    Home Medications Prior to Admission medications   Medication Sig Start Date End Date Taking? Authorizing Provider  amoxicillin (AMOXIL) 500 MG capsule Take 4 capsules by mouth 1 hour before dental appointment 05/18/22     ascorbic acid (VITAMIN C) 500 MG tablet Take 500 mg by mouth daily.    [provider]  aspirin EC 81 MG tablet Take 81 mg by mouth daily. Swallow whole.    [provider]  Cholecalciferol (VITAMIN D3) 50 MCG (2000 UT) TABS Take 2,000 Units by mouth daily.    [provider]  COVID-19 mRNA vaccine [phone] (COMIRNATY) syringe Inject into the muscle. 06/05/22   Carlyle Basques, MD  inclisiran (LEQVIO) 284 MG/1.5ML SOSY injection Inject 1.29m at 0 months, 3 months and then every 6 months thereafter. 02/13/22   SJerline Pain MD  levETIRAcetam (KEPPRA) 1000 MG tablet Take 1 tablet (1,000 mg total) by mouth 2 (two) times daily. 07/31/21   YMarcial Pacas MD  metoprolol succinate (TOPROL-XL) 25 MG 24 hr tablet Take 1/2 tablet (12.5 mg total) by mouth daily. With or immediately following a meal. 02/21/22   SJerline Pain MD  pravastatin (PRAVACHOL) 40 MG tablet  Take 1 tablet (40 mg total) by mouth every evening. 01/19/22   SJerline Pain MD  pyridOXINE (VITAMIN B-6) 100 MG tablet Take 100 mg by mouth daily.    [provider]  sildenafil (REVATIO) 20 MG tablet Take 2 -3 tablets by mouth once daily as needed 10/17/21     tamsulosin (FLOMAX) 0.4 MG CAPS capsule Take 2 capsules (0.8 mg total) by mouth daily. 05/15/22     traMADol (ULTRAM) 50 MG tablet Take 1 tablet (50 mg total) by mouth daily. 06/12/22     traZODone (DESYREL) 50 MG tablet Take 0.5-1 tablets (25-50 mg total) by mouth at bedtime as needed for sleep. 05/03/22   MMarrian Salvage FNP      Allergies  Oxycodone hcl, Zetia [ezetimibe], Augmentin [amoxicillin-pot clavulanate], Oxycodone-acetaminophen, Oxytocin, Sulfur, Chlorhexidine, and Elemental sulfur    Review of Systems   Review of Systems  Physical Exam Updated Vital Signs BP 110/75   Pulse (!) 104   Temp 97.9 F (36.6 C) (Oral)   Resp 19   Ht '5\' 10"'$  (1.778 m)   Wt 80.3 kg   SpO2 97%   BMI 25.40 kg/m  Physical Exam Vitals and nursing note reviewed.  HENT:     Head: Normocephalic.  Abdominal:     Comments: Left lower quadrant tenderness without rebound or guarding.  No hernia palpated.  Genitourinary:    Comments: Some stool in vault but no fecal impaction.  Some pain with rectal exam. Skin:    Capillary Refill: Capillary refill takes less than 2 seconds.  Neurological:     Mental Status: He is alert and oriented to person, place, and time.     ED Results / Procedures / Treatments   Labs (all labs ordered are listed, but only abnormal results are displayed) Labs Reviewed  CBC WITH DIFFERENTIAL/PLATELET - Abnormal; Notable for the following components:      Result Value   WBC 11.6 (*)    Neutro Abs 8.8 (*)    All other components within normal limits  BASIC METABOLIC PANEL - Abnormal; Notable for the following components:   Sodium 132 (*)    Glucose, Bld 118 (*)    Calcium 8.7 (*)    All other  components within normal limits  URINALYSIS, ROUTINE W REFLEX MICROSCOPIC    EKG None  Radiology No results found.  Procedures Procedures    Medications Ordered in ED Medications  iohexol (OMNIPAQUE) 300 MG/ML solution 80 mL (80 mLs Intravenous Contrast Given 06/23/22 1517)    ED Course/ Medical Decision Making/ A&P                           Medical Decision Making Amount and/or Complexity of Data Reviewed Labs: ordered. Radiology: ordered.  Risk Prescription drug management.   Patient with rectal pain abdominal pain and constipation.  Recent procedure for ureteral stent removal.  Not obstructed on rectal exam.  White count is mildly elevated.  Will get CT scan to further evaluate.  Care turned over to Dr. Regenia Skeeter.  Differential diagnosis initially included constipation, obstruction, diverticulitis.        Final Clinical Impression(s) / ED Diagnoses Final diagnoses:  None    Rx / DC Orders ED Discharge Orders     None         Davonna Belling, MD 06/23/22 1523

## 2022-06-23 NOTE — ED Notes (Addendum)
Patient discharged by provider. No questions on discharge paperwork.

## 2022-06-26 ENCOUNTER — Other Ambulatory Visit (HOSPITAL_BASED_OUTPATIENT_CLINIC_OR_DEPARTMENT_OTHER): Payer: Self-pay
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
## 2022-06-27 ENCOUNTER — Ambulatory Visit (INDEPENDENT_AMBULATORY_CARE_PROVIDER_SITE_OTHER): Payer: Medicare HMO | Admitting: Neurology

## 2022-06-27 ENCOUNTER — Encounter: Payer: Self-pay | Admitting: Neurology

## 2022-06-27 VITALS — BP 122/75 | HR 94 | Ht 70.0 in | Wt 177.0 lb

## 2022-06-27 DIAGNOSIS — R569 Unspecified convulsions: Secondary | ICD-10-CM

## 2022-06-27 NOTE — Patient Instructions (Signed)
Great to see you today, please continue Keppra for seizure prevention, I will see back in 1 year or sooner if needed

## 2022-06-28 ENCOUNTER — Telehealth: Payer: Self-pay | Admitting: Gastroenterology

## 2022-06-28 NOTE — Telephone Encounter (Signed)
Pt states he has only passed very small pieces of stool since he did the enema on Saturday. Let pt know he can try a miralax purge. Gave pt instructions for miralax purge and pt stated he would prefer to try magnesium citrate first. Let pt know he can try the magnesium citrate. Pt verbalized understanding.

## 2022-06-28 NOTE — Telephone Encounter (Signed)
Inbound call from patient stating he was seen in the ED last week for constipation and they gave him an enema. He was able to use the bathroom after but since he has not had a bowel movement. He has an appt scheduled for 12/6 with Dr.Cirigliano but is requesting a call for further advise until his appt. Please advise.

## 2022-07-03 ENCOUNTER — Ambulatory Visit (INDEPENDENT_AMBULATORY_CARE_PROVIDER_SITE_OTHER): Payer: Medicare HMO | Admitting: Family

## 2022-07-03 ENCOUNTER — Encounter: Payer: Self-pay | Admitting: Family

## 2022-07-03 ENCOUNTER — Other Ambulatory Visit: Payer: Self-pay | Admitting: Pharmacist

## 2022-07-03 ENCOUNTER — Telehealth: Payer: Self-pay | Admitting: Gastroenterology

## 2022-07-03 ENCOUNTER — Other Ambulatory Visit (HOSPITAL_BASED_OUTPATIENT_CLINIC_OR_DEPARTMENT_OTHER): Payer: Self-pay

## 2022-07-03 VITALS — BP 124/82 | HR 91 | Temp 97.5°F | Ht 70.0 in | Wt 179.2 lb

## 2022-07-03 DIAGNOSIS — E782 Mixed hyperlipidemia: Secondary | ICD-10-CM

## 2022-07-03 DIAGNOSIS — I639 Cerebral infarction, unspecified: Secondary | ICD-10-CM

## 2022-07-03 DIAGNOSIS — F411 Generalized anxiety disorder: Secondary | ICD-10-CM

## 2022-07-03 MED ORDER — ESCITALOPRAM OXALATE 10 MG PO TABS
10.0000 mg | ORAL_TABLET | Freq: Every day | ORAL | 0 refills | Status: DC
  Filled 2022-07-03: qty 90, 90d supply, fill #0

## 2022-07-03 NOTE — Telephone Encounter (Signed)
Inbound call from patient requesting a call to advise if he can have food after his miralax purge. Please advise.

## 2022-07-03 NOTE — Progress Notes (Signed)
Erik Salazar is a 71 y.o. male with the following history as recorded in EpicCare:  Patient Active Problem List   Diagnosis Date Noted   Postop check 03/26/2022   Gait abnormality 01/29/2022   Status post total right knee replacement 01/18/2022   Coronary artery disease involving native coronary artery of native heart without angina pectoris 01/04/2022   Preop cardiovascular exam 01/04/2022   Primary osteoarthritis of right knee 01/04/2022   Acute right-sided low back pain without sciatica 12/25/2021   Aortic atherosclerosis ([place]) 09/04/2021   Spondylolisthesis at L4-L5 level 06/28/2021   S/P repair of ventral hernia 05/19/2021   S/P repair of recurrent ventral hernia 05/19/2021   Benign prostatic hyperplasia without lower urinary tract symptoms 10/14/2020   Chronic pain 10/14/2020   ED (erectile dysfunction) of organic origin 10/14/2020   Esophageal dysphagia 10/14/2020   Gastroesophageal reflux disease 10/14/2020   History of aortic valve replacement with bioprosthetic valve 10/14/2020   Hx of aortic aneurysm repair 10/14/2020   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[place]) 10/14/2020   Thoracic aortic aneurysm without rupture ([place]) 10/14/2020   Recurrent incisional hernia 10/14/2020   Spinal stenosis in cervical region 05/04/2020   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   Cervical radiculopathy at C6 11/14/2017   Carpal tunnel syndrome on right 07/08/2017   Tremor, essential 07/08/2017   Ischemic stroke of frontal lobe ([place]) 04/05/2017   Pain in right hand 04/05/2017   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 03/27/2017   Seizure ([place]) 03/14/2017   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/13/2017   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     aspirin EC 81 MG tablet Take 81 mg by mouth daily. Swallow whole.     escitalopram (LEXAPRO) 10 MG tablet Take 1 tablet (10 mg total) by mouth daily. 90 tablet 0   inclisiran (LEQVIO) 284 MG/1.5ML SOSY injection Inject 1.52m at 0 months, 3 months and then every 6 months thereafter. 1.5 mL 0   levETIRAcetam (KEPPRA) 1000 MG tablet Take 1 tablet (1,000 mg total) by mouth 2 (two) times daily. 180 tablet 4   metoprolol succinate (TOPROL-XL) 25 MG 24 hr tablet Take 1/2 tablet (12.5 mg total) by mouth daily. With or immediately following a meal. 45 tablet 3   Multiple Vitamins-Minerals (MULTIVITAMIN WITH MINERALS) tablet Take 1 tablet by mouth daily.     pravastatin (PRAVACHOL) 40 MG tablet Take 1 tablet (40 mg total) by mouth every evening. 90 tablet 3   sildenafil (REVATIO) 20 MG tablet Take 2 -3 tablets by mouth once daily as needed 30 tablet 1   tamsulosin (FLOMAX) 0.4 MG CAPS capsule Take 2 capsules (0.8 mg total) by mouth daily. 180 capsule 1   traZODone (DESYREL) 50 MG tablet Take 0.5-1 tablets (25-50 mg total) by mouth at bedtime as needed for sleep. 30 tablet 3   No current facility-administered medications for this visit.    Allergies: Oxycodone hcl, Zetia [ezetimibe], Augmentin [amoxicillin-pot clavulanate], Oxycodone-acetaminophen, Oxytocin, Sulfur, Chlorhexidine, and Elemental sulfur  Past Medical History:  Diagnosis Date   Arthritis    RA and OA   Carpal tunnel syndrome on right 07/08/2017   Cervical radiculopathy at C6 11/14/2017  Right   Complication of anesthesia    Coronary artery disease    Enlarged prostate    Fibromyalgia    H/O: knee surgery    15    History of blood clots    History of kidney stones    History of open heart surgery    ASCENDING AORTIC ANEURYSM   Ischemic stroke of frontal lobe ([place]) 03/14/2017   Bilateral; post-redo CT surgery   Pneumonia    PONV (postoperative nausea and vomiting)     only after CABG surgeries   Seizure disorder ([place]) 03/14/2017   Seizures ([place])    Status post knee surgery    DVT POST KNEE SURGERY   Stroke [facility])     Past Surgical History:  Procedure Laterality Date   ANTERIOR CERVICAL DECOMP/DISCECTOMY FUSION N/A 05/04/2020   Procedure: Anterior Cervical Decompression/Discectomy Fuion Cervical three-four, Cervical four-five, Cervical five-six;  Surgeon: Kary Kos, MD;  Location: [place];  Service: Neurosurgery;  Laterality: N/A;   BALLOON DILATION N/A 06/23/2019   Procedure: BALLOON DILATION;  Surgeon: Otis Brace, MD;  [facility];  Service: Gastroenterology;  Laterality: N/A;   BENTALL PROCEDURE  01/04/2016   Bentall with 23 mm pericardial AVR; SVG-LAD, SVG-CX ([place])   BICEPS TENDON REPAIR Right    BIOPSY  06/23/2019   Procedure: BIOPSY;  Surgeon: Otis Brace, MD;  [facility];  Service: Gastroenterology;;   CARDIAC SURGERY     ANUERSYM MAY 2017   Bentall procedure. Bioprosthetic aortic valve #23 mm bovine model #2700 TF ask, and 28 mm Gelweave woven vascular sinus of Valsalva graft   CARPAL TUNNEL RELEASE  08/2018   right hand    CORONARY ARTERY BYPASS GRAFT  01/04/2016   VG to LAD & VG to LCX   CORONARY ARTERY BYPASS GRAFT  03/13/2017   LIMA to LAD with steril abcess removal from dacron graft   CYSTOSCOPY/URETEROSCOPY/HOLMIUM LASER/STENT PLACEMENT Left 06/12/2022   Procedure: CYSTOSCOPY/URETEROSCOPY/HOLMIUM LASER/STENT PLACEMENT;  Surgeon: Lucas Mallow, MD;  [facility];  Service: Urology;  Laterality: Left;   ESOPHAGOGASTRODUODENOSCOPY     Had done dilatation done about 2 or 3 times before in [place]   ESOPHAGOGASTRODUODENOSCOPY (EGD) WITH PROPOFOL N/A 06/23/2019   Procedure: ESOPHAGOGASTRODUODENOSCOPY (EGD) WITH PROPOFOL;  Surgeon: Otis Brace, MD;  [facility];  Service: Gastroenterology;  Laterality: N/A;   FALSE ANEURYSM REPAIR  03/13/2017   redo sternotomy,  sterile abscess removal from Dacron graft, omental flap around aorta, CABG: LIMA-LAD ([name], Dr. Mart Piggs)   [place]  2018   Of pericardium 2018   INSERTION OF MESH  10/14/2020   Procedure: INSERTION OF MESH;  Surgeon: Michael Boston, MD;  Location: [place];  Service: General;;   knee surgeries     13 surgeries on knee done before knee replacement    [place] N/A 05/19/2021   Procedure: LYSIS OF ADHESIONS;  Surgeon: Michael Boston, MD;  Location: [place];  Service: General;  Laterality: N/A;  GEN & LOCAL   LYSIS OF ADHESION N/A 10/14/2020   Procedure: LYSIS OF ADHESION;  Surgeon: Michael Boston, MD;  Location: [place];  Service: General;  Laterality: N/A;   open heart surgery     03-13-2017   REPLACEMENT TOTAL KNEE Left 2015   ROTATOR CUFF REPAIR Right    done with biceps tendon repair   TONSILLECTOMY     removed as a child.   TOTAL  KNEE ARTHROPLASTY Right 01/18/2022   Procedure: RIGHT TOTAL KNEE ARTHROPLASTY;  Surgeon: Leandrew Koyanagi, MD;  Location: [place];  Service: Orthopedics;  Laterality: Right;   VENTRAL HERNIA REPAIR N/A 10/14/2020   Procedure: LAPAROSCOPIC VENTRAL HERNIA REPAIR WITH TAP BLOCK BILATERAL;  Surgeon: Michael Boston, MD;  Location: [place];  Service: General;  Laterality: N/A;   VENTRAL HERNIA REPAIR N/A 05/19/2021   Procedure: LAPAROSCOPIC VENTRAL WALL HERNIA REPAIR;  Surgeon: Michael Boston, MD;  Location: [place];  Service: General;  Laterality: N/A;    Family History  Problem Relation Age of Onset   Arthritis Mother    Aneurysm Mother        brain aneurysm for mother.    Arthritis Father    Colon cancer Neg Hx    Esophageal cancer Neg Hx     Social History   Tobacco Use   Smoking status: Former    Types: Cigars    Quit date: 2021    Years since quitting: 2.8   Smokeless tobacco: Never   Tobacco comments:    Only smoked cigars for 6 months  Substance Use Topics   Alcohol use: Yes    Alcohol/week:  1.0 standard drink of alcohol    Types: 1 Cans of beer per week    Comment: occassionally    Subjective:  Accompanied by wife; concerned about increasing anxiety issues; feels like starting to affect his quality of life; similar response after 9/11 Media planner);      Objective:  Vitals:   07/03/22 1304  BP: 124/82  Pulse: 91  Temp: (!) 97.5 F (36.4 C)  TempSrc: Oral  SpO2: 98%  Weight: 179 lb 3.2 oz (81.3 kg)  Height: '5\' 10"'$  (1.778 m)    General: Well developed, well nourished, in no acute distress  Skin : Warm and dry.  Head: Normocephalic and atraumatic  Lungs: Respirations unlabored;  Neurologic: Alert and oriented; speech intact; face symmetrical; moves all extremities well; CNII-XII intact without focal deficit   Assessment:  1. Generalized anxiety disorder     Plan:  Trial of Lexapro 10 mg daily; risks/ benefits discussed; refer to behavioral health; follow up in 4 weeks with response.   Time spent 30 minutes  Return in about 1 month (around 08/02/2022) for I will reach out to you in 4 weeks to see how you are doing.  Orders Placed This Encounter  Procedures   Ambulatory referral to Psychology    Referral Priority:   Routine    Referral Type:   Psychiatric    Referral Reason:   Specialty Services Required    Requested Specialty:   Psychology    Number of Visits Requested:   1    Requested Prescriptions   Signed Prescriptions Disp Refills   escitalopram (LEXAPRO) 10 MG tablet 90 tablet 0    Sig: Take 1 tablet (10 mg total) by mouth daily.

## 2022-07-03 NOTE — Telephone Encounter (Addendum)
Received secure chat from patient's PCP stating that pt did not get much relief from mag citrate. Called pt to give him instructions to Miralax purge, pt requested that I send instructions to his mychart. Miralax purge instructions sent to pt's mychart.

## 2022-07-03 NOTE — Telephone Encounter (Signed)
Spoke with pt and let him know he can eat after doing miralax purge. Pt verbalized understanding.

## 2022-07-09 ENCOUNTER — Other Ambulatory Visit (HOSPITAL_BASED_OUTPATIENT_CLINIC_OR_DEPARTMENT_OTHER): Payer: Self-pay

## 2022-07-10 ENCOUNTER — Other Ambulatory Visit (HOSPITAL_BASED_OUTPATIENT_CLINIC_OR_DEPARTMENT_OTHER): Payer: Self-pay

## 2022-07-11 ENCOUNTER — Ambulatory Visit: Payer: Medicare HMO | Admitting: Orthopaedic Surgery

## 2022-07-25 ENCOUNTER — Telehealth: Payer: Self-pay

## 2022-07-25 ENCOUNTER — Encounter: Payer: Self-pay | Admitting: Gastroenterology

## 2022-07-25 ENCOUNTER — Encounter: Payer: Self-pay | Admitting: Family

## 2022-07-25 ENCOUNTER — Ambulatory Visit (INDEPENDENT_AMBULATORY_CARE_PROVIDER_SITE_OTHER): Payer: Medicare HMO | Admitting: Gastroenterology

## 2022-07-25 ENCOUNTER — Other Ambulatory Visit (HOSPITAL_BASED_OUTPATIENT_CLINIC_OR_DEPARTMENT_OTHER): Payer: Self-pay

## 2022-07-25 VITALS — BP 118/82 | HR 92 | Ht 70.0 in | Wt 175.0 lb

## 2022-07-25 DIAGNOSIS — Z9889 Other specified postprocedural states: Secondary | ICD-10-CM

## 2022-07-25 DIAGNOSIS — I2510 Atherosclerotic heart disease of native coronary artery without angina pectoris: Secondary | ICD-10-CM

## 2022-07-25 DIAGNOSIS — R195 Other fecal abnormalities: Secondary | ICD-10-CM

## 2022-07-25 DIAGNOSIS — Z953 Presence of xenogenic heart valve: Secondary | ICD-10-CM

## 2022-07-25 DIAGNOSIS — Z8679 Personal history of other diseases of the circulatory system: Secondary | ICD-10-CM

## 2022-07-25 DIAGNOSIS — K5900 Constipation, unspecified: Secondary | ICD-10-CM

## 2022-07-25 MED ORDER — CLENPIQ 10-3.5-12 MG-GM -GM/175ML PO SOLN
1.0000 | Freq: Once | ORAL | 0 refills | Status: AC
  Filled 2022-07-25: qty 350, 1d supply, fill #0

## 2022-07-25 NOTE — Progress Notes (Signed)
Chief Complaint:    Constipation, bloating  GI History: 71 year old male with a history of CAD s/p CABG (on ASA), bilateral frontal stroke following aortic valve surgery 02/2017, GAD, seizure disorder, cervical spine fusion 2022, lumbar spine surgery 2022, AAA s/p Bentall procedure with AVR, laparoscopic repair of incisional recurrent subxiphoid hernia with mesh and LOA 04/2021.  - 2017: Colonoscopy by Dr. Durel Salts in [place], [place].  No report available for review, but he very clearly remembers being on a 5-year recall. - 06/2019: EGD by Dr. Alessandra Bevels: Schatzki's ring dilated with 18 mm TTS balloon, small HH, normal stomach/duodenum - 09/11/2021: Initial appointment in [place] [facility] to discuss [place] screening.  Elected to postpone 3+ months to allow recovery from recent multiple surgeries.  This was further postponed due to knee surgery   HPI:     Patient is a 71 y.o. male presenting to the Gastroenterology Clinic for follow-up.  Was initially seen by me on 09/11/2021 for CRC screening as outlined above.  Elected to postpone colonoscopy due to multiple surgeries.  CT in 05/2022 for left-sided flank pain with 5 mm left ureterovesicular junction calculus and subsequently underwent cystoscopy with left ureteroscopy, laser lithotripsy, and left ureteral stent placement 06/12/2022.  Stent has been subsequently removed.    Main issue today is constipation, small-volume stools, and abdominal bloating.  He went to the ER on 06/23/2022.  CT with large amount of stool in the rectum, fecal impaction suspected.  He was treated with enema with improvement initially, but symptoms have since returned.  No improvement with mag citrate.  He called into the office on 06/28/2022 and we recommended MiraLAX purge prep.  Had large volume stools with MiraLAX purge prep and relief for a few days, but symptoms recurred 2-3 days later.  Still dealing with constipation, small volume stools, and bloating. Repeated Miralax 2 caps  and enema with large BM 4 days ago. Sxs recurred 2-3 days later.  Small stools yesterday.  Drinks at least 64 ounces of water/day.  Thinks this started after admission for kidney stone and stent placement in 05/2022. Not taking any pain medications (never filled Rx).       Latest Ref Rng & Units 06/23/2022    2:49 PM 05/07/2022    9:18 AM 01/11/2022    8:38 AM  CMP  Glucose 70 - 99 mg/dL 118  93  101   BUN 8 - 23 mg/dL '13  13  11   '$ Creatinine 0.61 - 1.24 mg/dL 0.89  1.08  1.03   Sodium 135 - 145 mmol/L 132  133  135   Potassium 3.5 - 5.1 mmol/L 4.1  4.4  4.5   Chloride 98 - 111 mmol/L 101  97  100   CO2 22 - 32 mmol/L '22  27  26   '$ Calcium 8.9 - 10.3 mg/dL 8.7  9.5  9.5   Total Protein 6.0 - 8.3 g/dL  7.1  7.1   Total Bilirubin 0.2 - 1.2 mg/dL  0.8  0.8   Alkaline Phos 39 - 117 U/L  97  80   AST 0 - 37 U/L  17  24   ALT 0 - 53 U/L  16  23       Latest Ref Rng & Units 06/23/2022    2:49 PM 05/07/2022    9:18 AM 01/19/2022    6:22 AM  CBC  WBC 4.0 - 10.5 K/uL 11.6  8.2  12.1   Hemoglobin 13.0 - 17.0 g/dL 14.3  14.5  13.4   Hematocrit 39.0 - 52.0 % 42.9  44.3  40.5   Platelets 150 - 400 K/uL 305  258.0  241      Review of systems:     No chest pain, no SOB, no fevers, no urinary sx   Past Medical History:  Diagnosis Date   Arthritis    RA and OA   Carpal tunnel syndrome on right 07/08/2017   Cervical radiculopathy at C6 44/10/4740   Right   Complication of anesthesia    Coronary artery disease    Enlarged prostate    Fibromyalgia    H/O: knee surgery    15    History of blood clots    History of kidney stones    History of open heart surgery    ASCENDING AORTIC ANEURYSM   Ischemic stroke of frontal lobe ([place]) 03/14/2017   Bilateral; post-redo CT surgery   Pneumonia    PONV (postoperative nausea and vomiting)    only after CABG surgeries   Seizure disorder ([place]) 03/14/2017   Seizures ([place])    Status post knee surgery    DVT POST KNEE SURGERY   Stroke [facility])      Patient's surgical history, family medical history, social history, medications and allergies were all reviewed in Epic    Current Outpatient Medications  Medication Sig Dispense Refill   amoxicillin (AMOXIL) 500 MG capsule Take 4 capsules by mouth 1 hour before dental appointment 16 capsule 0   ascorbic acid (VITAMIN C) 500 MG tablet Take 500 mg by mouth daily.     aspirin EC 81 MG tablet Take 81 mg by mouth daily. Swallow whole.     escitalopram (LEXAPRO) 10 MG tablet Take 1 tablet (10 mg total) by mouth daily. 90 tablet 0   inclisiran (LEQVIO) 284 MG/1.5ML SOSY injection Inject 1.59m at 0 months, 3 months and then every 6 months thereafter. 1.5 mL 0   levETIRAcetam (KEPPRA) 1000 MG tablet Take 1 tablet (1,000 mg total) by mouth 2 (two) times daily. 180 tablet 4   metoprolol succinate (TOPROL-XL) 25 MG 24 hr tablet Take 1/2 tablet (12.5 mg total) by mouth daily. With or immediately following a meal. 45 tablet 3   Multiple Vitamins-Minerals (MULTIVITAMIN WITH MINERALS) tablet Take 1 tablet by mouth daily.     pravastatin (PRAVACHOL) 40 MG tablet Take 1 tablet (40 mg total) by mouth every evening. 90 tablet 3   sildenafil (REVATIO) 20 MG tablet Take 2 -3 tablets by mouth once daily as needed 30 tablet 1   tamsulosin (FLOMAX) 0.4 MG CAPS capsule Take 2 capsules (0.8 mg total) by mouth daily. 180 capsule 1   traZODone (DESYREL) 50 MG tablet Take 0.5-1 tablets (25-50 mg total) by mouth at bedtime as needed for sleep. (Patient not taking: Reported on 07/25/2022) 30 tablet 3   No current facility-administered medications for this visit.    Physical Exam:     BP 118/82   Pulse 92   Ht '5\' 10"'$  (1.778 m)   Wt 175 lb (79.4 kg)   BMI 25.11 kg/m   GENERAL:  Pleasant male in NAD PSYCH: : Cooperative, normal affect ABDOMEN:  Nondistended, soft, nontender. No obvious masses, no hepatomegaly,  normal bowel sounds SKIN:  turgor, no lesions seen Musculoskeletal:  Normal muscle tone, normal  strength NEURO: Alert and oriented x 3, no focal neurologic deficits Rectal exam: 1 large and 1 small external skin tag.  No external hemorrhoids, fissures.  Sensation intact  and preserved anal wink.  Increased sphincter tone.  Decreased rectal descent.  Minimal change in rectal tone with "push phase" suggestive of dyssynergia.  No palpable mass. No blood on the exam glove. (Chaperone: Renee Rival, CMA).     IMPRESSION and PLAN:    1) Constipation 2) Change in bowel habits Relatively acute in onset change in bowel habits with constipation, straining to have BM, and change in stool form.  Exam today with increased rectal tone and some features of pelvic floor dyssynergia.  Discussed full DDx with plan as follows: - Plan for Sitz marker study.  He was highly concerned about doing the test today since his last BM was several days ago.  We will plan to do MiraLAX 2 caps and enema today for "clear out", then ingest sitz [place] tomorrow with abdominal x-ray 5 days later.  I did explain to him that if his constipation symptoms become unbearable over the 5-day.,  To just resume the MiraLAX and call me to update that study will be interpreted a bit differently - Hold MiraLAX the best he can over the duration of the study as outlined above - Continue adequate hydration with only 64 ounces of water/day - Referral for pelvic floor PT for dyssynergia - Colonoscopy to evaluate for medical/luminal pathology - If ongoing symptoms and negative work-up, may benefit from brief trial of methylnaltrexone?  3) CAD s/p CABG 4) Chronic antiplatelet therapy 5) Aortic valve surgery 6) History of CVA -Obtain Cardiology clearance to proceed with colonoscopy - Ok to resume ASA 81 mg in the perioperative setting    The indications, risks, and benefits of colonoscopy were explained to the patient in detail. Risks include but are not limited to bleeding, perforation, adverse reaction to medications, and cardiopulmonary  compromise. Sequelae include but are not limited to the possibility of surgery, hospitalization, and mortality. The patient verbalized understanding and wished to proceed. All questions answered, referred to the scheduler and bowel prep ordered. Further recommendations pending results of the exam.     I spent 40 minutes of time, including in depth chart review, independent review of results as outlined above, communicating results with the patient directly, face-to-face time with the patient, coordinating care, ordering studies and medications as appropriate, and documentation.      Lavena Bullion ,DO, FACG 07/25/2022, 9:51 AM

## 2022-07-25 NOTE — Patient Instructions (Signed)
We have sent the following medications to your pharmacy for you to pick up at your convenience: [place] have been scheduled for a colonoscopy. Please follow written instructions given to you at your visit today.  Please pick up your prep supplies at the pharmacy within the next 1-3 days. If you use inhalers (even only as needed), please bring them with you on the day of your procedure.   We have placed a referral to pelvic floor therapy. Someone will contact you to schedule for a consult.  SITZMARKS (Simplified Transit Test) On day One: Take one SITZMARKS capsule by mouth with water. Use no laxatives, enemas, or suppositories for 5 days. On day Five: Go to Radiology and have your abdominal xray.  We will contact you with the results with in several days.   Take miralax 2 capful a day and hold during Sitz study.  Due to recent changes in healthcare laws, you may see the results of your imaging and laboratory studies on MyChart before your provider has had a chance to review them.  We understand that in some cases there may be results that are confusing or concerning to you. Not all laboratory results come back in the same time frame and the provider may be waiting for multiple results in order to interpret others.  Please give [place] 48 hours in order for your provider to thoroughly review all the results before contacting the office for clarification of your results.   Thank you for choosing me and [facility].  Vito Cirigliano, D.O.

## 2022-07-25 NOTE — Telephone Encounter (Signed)
   Name: Erik Salazar  DOB: 07-07-1951  MRN: [id number]  Primary Cardiologist: None   Preoperative team, please contact this patient and set up a phone call appointment for further preoperative risk assessment. Please obtain consent and complete medication review. Thank you for your help.  I confirm that guidance regarding antiplatelet and oral anticoagulation therapy has been completed and, if necessary, noted below (none requested).    Lenna Sciara, NP 07/25/2022, 11:14 AM [place]

## 2022-07-25 NOTE — Telephone Encounter (Signed)
[facility] Pre-operative Risk Assessment     Request for surgical clearance:     Endoscopy Procedure  What type of surgery is being performed?     Colonoscopy  When is this surgery scheduled?     09/12/22  What type of clearance is required ?   Medical  Are there any medications that need to be held prior to surgery and how long? No  Practice name and name of physician performing surgery?  Dr. Bryan Lemma,   [facility]  What is your office phone and fax number?      Phone- [phone]  [phone]  Anesthesia type (None, local, MAC, general) ?       MAC

## 2022-07-26 NOTE — Telephone Encounter (Signed)
Called patient and informed him for my call. Patient asked that myself or someone from the office to call him back next week to schedule appointment.

## 2022-07-30 ENCOUNTER — Ambulatory Visit (INDEPENDENT_AMBULATORY_CARE_PROVIDER_SITE_OTHER)
Admission: RE | Admit: 2022-07-30 | Discharge: 2022-07-30 | Disposition: A | Discharge status: Home / Self Care | Payer: Medicare HMO | Source: Ambulatory Visit | Attending: Gastroenterology | Admitting: Gastroenterology

## 2022-07-30 DIAGNOSIS — R195 Other fecal abnormalities: Secondary | ICD-10-CM | POA: Diagnosis not present

## 2022-07-30 DIAGNOSIS — K5900 Constipation, unspecified: Secondary | ICD-10-CM | POA: Diagnosis not present

## 2022-07-31 ENCOUNTER — Other Ambulatory Visit (HOSPITAL_BASED_OUTPATIENT_CLINIC_OR_DEPARTMENT_OTHER): Payer: Self-pay

## 2022-07-31 ENCOUNTER — Telehealth: Payer: Self-pay | Admitting: Pharmacy Technician

## 2022-07-31 ENCOUNTER — Other Ambulatory Visit (HOSPITAL_COMMUNITY): Payer: Self-pay

## 2022-07-31 ENCOUNTER — Telehealth: Payer: Self-pay | Admitting: *Deleted

## 2022-07-31 ENCOUNTER — Telehealth: Payer: Self-pay | Admitting: Gastroenterology

## 2022-07-31 ENCOUNTER — Other Ambulatory Visit: Payer: Self-pay

## 2022-07-31 MED ORDER — LINACLOTIDE 290 MCG PO CAPS
290.0000 ug | ORAL_CAPSULE | Freq: Every day | ORAL | 3 refills | Status: DC
  Filled 2022-07-31: qty 90, 90d supply, fill #0

## 2022-07-31 MED ORDER — LUBIPROSTONE 8 MCG PO CAPS
8.0000 ug | ORAL_CAPSULE | Freq: Two times a day (BID) | ORAL | 3 refills | Status: DC
  Filled 2022-07-31 – 2022-08-17 (×2): qty 180, 90d supply, fill #0
  Filled 2022-08-17: qty 120, 60d supply, fill #0
  Filled 2022-08-21: qty 60, 30d supply, fill #0
  Filled 2022-08-21: qty 120, 60d supply, fill #0
  Filled 2023-02-05: qty 180, 90d supply, fill #1
  Filled 2023-07-24: qty 180, 90d supply, fill #2

## 2022-07-31 NOTE — Telephone Encounter (Signed)
Pt stated that he is unable to afford the Linzess that was sent in. Pt said it would be $330 with his insurance and pt is requesting a different medication be sent in.

## 2022-07-31 NOTE — Telephone Encounter (Signed)
Pt has been scheduled for tele pre op appt 08/17/22 @ 10 am. Med rec and consent are done.     Patient Consent for Virtual Visit        Erik Salazar has provided verbal consent on 07/31/2022 for a virtual visit (video or telephone).   CONSENT FOR VIRTUAL VISIT FOR:  Erik Salazar  By participating in this virtual visit I agree to the following:  I hereby voluntarily request, consent and authorize [facility] and its employed or contracted physicians, physician assistants, nurse practitioners or other licensed health care professionals (the Practitioner), to provide me with telemedicine health care services (the "Services") as deemed necessary by the treating Practitioner. I acknowledge and consent to receive the Services by the Practitioner via telemedicine. I understand that the telemedicine visit will involve communicating with the Practitioner through live audiovisual communication technology and the disclosure of certain medical information by electronic transmission. I acknowledge that I have been given the opportunity to request an in-person assessment or other available alternative prior to the telemedicine visit and am voluntarily participating in the telemedicine visit.  I understand that I have the right to withhold or withdraw my consent to the use of telemedicine in the course of my care at any time, without affecting my right to future care or treatment, and that the Practitioner or I may terminate the telemedicine visit at any time. I understand that I have the right to inspect all information obtained and/or recorded in the course of the telemedicine visit and may receive copies of available information for a reasonable fee.  I understand that some of the potential risks of receiving the Services via telemedicine include:  Delay or interruption in medical evaluation due to technological equipment failure or disruption; Information transmitted may not be sufficient (e.g.  poor resolution of images) to allow for appropriate medical decision making by the Practitioner; and/or  In rare instances, security protocols could fail, causing a breach of personal health information.  Furthermore, I acknowledge that it is my responsibility to provide information about my medical history, conditions and care that is complete and accurate to the best of my ability. I acknowledge that Practitioner's advice, recommendations, and/or decision may be based on factors not within their control, such as incomplete or inaccurate data provided by me or distortions of diagnostic images or specimens that may result from electronic transmissions. I understand that the practice of medicine is not an exact science and that Practitioner makes no warranties or guarantees regarding treatment outcomes. I acknowledge that a copy of this consent can be made available to me via my patient portal ([facility]), or I can request a printed copy by calling the office of [facility].    I understand that my insurance will be billed for this visit.   I have read or had this consent read to me. I understand the contents of this consent, which adequately explains the benefits and risks of the Services being provided via telemedicine.  I have been provided ample opportunity to ask questions regarding this consent and the Services and have had my questions answered to my satisfaction. I give my informed consent for the services to be provided through the use of telemedicine in my medical care

## 2022-07-31 NOTE — Telephone Encounter (Signed)
Amitiza sent to pt's pharmacy. Pt verbalized understanding.

## 2022-07-31 NOTE — Telephone Encounter (Signed)
Incoming call from patient states he is returning a call. Please advise

## 2022-07-31 NOTE — Telephone Encounter (Signed)
Patient Advocate Encounter  Received notification from [facility] that prior authorization for LUBIPROSTONE [facility] is required.   PA submitted on 12.12.23 Key [id number] Status is pending    Luciano Cutter, CPhT Patient Advocate Phone: [phone]

## 2022-07-31 NOTE — Telephone Encounter (Signed)
Ok, instead send in Rx for Amitiza 8 mcg twice daily.  If suboptimal response, will plan to move up to 24 mcg twice daily.

## 2022-07-31 NOTE — Telephone Encounter (Signed)
Pt has been scheduled for tele pre op appt 08/17/22 @ 10 am. Med rec and consent are done.

## 2022-08-01 ENCOUNTER — Other Ambulatory Visit (HOSPITAL_BASED_OUTPATIENT_CLINIC_OR_DEPARTMENT_OTHER): Payer: Self-pay

## 2022-08-01 ENCOUNTER — Telehealth: Payer: Self-pay | Admitting: Family

## 2022-08-01 ENCOUNTER — Other Ambulatory Visit (HOSPITAL_COMMUNITY): Payer: Self-pay

## 2022-08-01 NOTE — Telephone Encounter (Signed)
Please call and check on his response to Lexapro; is he feeling any better?

## 2022-08-01 NOTE — Telephone Encounter (Signed)
Received a fax regarding Prior Authorization from [facility] for LUBIPROSTONE 8MCG. Authorization has been DENIED because pt has not tried or cannot use Lactulose AND Linzess.

## 2022-08-01 NOTE — Telephone Encounter (Signed)
-----   Message from Marrian Salvage, [place] sent at 07/03/2022  4:53 PM EST ----- Check on response to Lexapro

## 2022-08-01 NOTE — Telephone Encounter (Signed)
Spoke with Pt, he stated he still feels about the same. No changes

## 2022-08-02 ENCOUNTER — Other Ambulatory Visit (HOSPITAL_BASED_OUTPATIENT_CLINIC_OR_DEPARTMENT_OTHER): Payer: Self-pay

## 2022-08-02 ENCOUNTER — Other Ambulatory Visit: Payer: Self-pay

## 2022-08-02 NOTE — Telephone Encounter (Signed)
Pt has a sulfur allergy. Called pt to see what reaction he has to sulfur. Left message for pt to call back.

## 2022-08-02 NOTE — Telephone Encounter (Signed)
Left message on machine to call back  

## 2022-08-02 NOTE — Telephone Encounter (Signed)
See below

## 2022-08-02 NOTE — Telephone Encounter (Signed)
Can submit Rx for lactulose 20 g daily.  Can increase to twice daily if suboptimal response after a few days.  Please provide with 90-day supply, RF 3.  If suboptimal response to appropriate trial of lactulose, will again submit for authorization of Amitiza with explanation that Linzess is cost prohibitive.

## 2022-08-03 ENCOUNTER — Encounter: Payer: Self-pay | Admitting: *Deleted

## 2022-08-03 NOTE — Telephone Encounter (Signed)
Left message on machine to call back  

## 2022-08-03 NOTE — Telephone Encounter (Signed)
Unable to reach letter sent

## 2022-08-06 ENCOUNTER — Other Ambulatory Visit (HOSPITAL_BASED_OUTPATIENT_CLINIC_OR_DEPARTMENT_OTHER): Payer: Self-pay

## 2022-08-07 ENCOUNTER — Other Ambulatory Visit (HOSPITAL_BASED_OUTPATIENT_CLINIC_OR_DEPARTMENT_OTHER): Payer: Self-pay

## 2022-08-07 NOTE — Telephone Encounter (Signed)
Left message for pt to call back  °

## 2022-08-09 NOTE — Telephone Encounter (Signed)
Left message for pt to call back  °

## 2022-08-16 NOTE — Telephone Encounter (Signed)
Letter mailed to patient.

## 2022-08-16 NOTE — Telephone Encounter (Signed)
Left message for pt to call back  °

## 2022-08-17 ENCOUNTER — Encounter: Payer: Self-pay | Admitting: Nurse Practitioner

## 2022-08-17 ENCOUNTER — Ambulatory Visit: Payer: Medicare HMO | Attending: Cardiovascular Disease | Admitting: Nurse Practitioner

## 2022-08-17 ENCOUNTER — Other Ambulatory Visit (HOSPITAL_BASED_OUTPATIENT_CLINIC_OR_DEPARTMENT_OTHER): Payer: Self-pay

## 2022-08-17 DIAGNOSIS — Z01810 Encounter for preprocedural cardiovascular examination: Secondary | ICD-10-CM

## 2022-08-17 NOTE — Progress Notes (Signed)
Virtual Visit via Telephone Note   Because of Erik Salazar's co-morbid illnesses, he is at least at moderate risk for complications without adequate follow up.  This format is felt to be most appropriate for this patient at this time.  The patient did not have access to video technology/had technical difficulties with video requiring transitioning to audio format only (telephone).  All issues noted in this document were discussed and addressed.  No physical exam could be performed with this format.  Please refer to the patient's chart for his consent to telehealth for [facility].  Evaluation Performed:  Preoperative cardiovascular risk assessment _____________   Date:  08/17/2022   Patient ID:  Erik Salazar, DOB May 15, 1951, MRN [id number] Patient Location:  Home Provider location:   Office  Primary Care Provider:  Marrian Salvage, [place] Primary Cardiologist:  None  Chief Complaint / Patient Profile   71 y.o. y/o male with a h/o CAD s/p CABG and AVR/Bentall, post repair thoracic aortic aneurysm (follows with CT surgery), hyperlipidemia, and CVA who is pending colonoscopy on 09/12/2022 with Dr. Bryan Lemma of [facility] and presents today for telephonic preoperative cardiovascular risk assessment.  History of Present Illness    Erik Salazar is a 71 y.o. male who presents via audio/video conferencing for a telehealth visit today.  Pt was last seen in cardiology clinic on 01/04/2022 by Dr. Marlou Porch.  At that time Erik Salazar was doing well. The patient is now pending procedure as outlined above. Since his last visit, he has done well from a cardiac standpoint.   He denies chest pain, palpitations, dyspnea, pnd, orthopnea, n, v, dizziness, syncope, edema, weight gain, or early satiety. All other systems reviewed and are otherwise negative except as noted above.   Past Medical History    Past Medical History:  Diagnosis Date   Arthritis    RA and OA   Carpal tunnel  syndrome on right 07/08/2017   Cervical radiculopathy at C6 33/82/5053   Right   Complication of anesthesia    Coronary artery disease    Enlarged prostate    Fibromyalgia    H/O: knee surgery    15    History of blood clots    History of kidney stones    History of open heart surgery    ASCENDING AORTIC ANEURYSM   Ischemic stroke of frontal lobe ([place]) 03/14/2017   Bilateral; post-redo CT surgery   Pneumonia    PONV (postoperative nausea and vomiting)    only after CABG surgeries   Seizure disorder ([place]) 03/14/2017   Seizures ([place])    Status post knee surgery    DVT POST KNEE SURGERY   Stroke [facility])    Past Surgical History:  Procedure Laterality Date   ANTERIOR CERVICAL DECOMP/DISCECTOMY FUSION N/A 05/04/2020   Procedure: Anterior Cervical Decompression/Discectomy Fuion Cervical three-four, Cervical four-five, Cervical five-six;  Surgeon: Kary Kos, MD;  Location: [place];  Service: Neurosurgery;  Laterality: N/A;   BALLOON DILATION N/A 06/23/2019   Procedure: BALLOON DILATION;  Surgeon: Otis Brace, MD;  [facility];  Service: Gastroenterology;  Laterality: N/A;   BENTALL PROCEDURE  01/04/2016   Bentall with 23 mm pericardial AVR; SVG-LAD, SVG-CX ([place])   BICEPS TENDON REPAIR Right    BIOPSY  06/23/2019   Procedure: BIOPSY;  Surgeon: Otis Brace, MD;  [facility];  Service: Gastroenterology;;   CARDIAC SURGERY     ANUERSYM MAY 2017   Bentall procedure. Bioprosthetic aortic valve #23  mm bovine model #2700 TF ask, and 28 mm Gelweave woven vascular sinus of Valsalva graft   CARPAL TUNNEL RELEASE  08/2018   right hand    CORONARY ARTERY BYPASS GRAFT  01/04/2016   VG to LAD & VG to LCX   CORONARY ARTERY BYPASS GRAFT  03/13/2017   LIMA to LAD with steril abcess removal from dacron graft   CYSTOSCOPY/URETEROSCOPY/HOLMIUM LASER/STENT PLACEMENT Left 06/12/2022   Procedure: CYSTOSCOPY/URETEROSCOPY/HOLMIUM LASER/STENT  PLACEMENT;  Surgeon: Lucas Mallow, MD;  [facility];  Service: Urology;  Laterality: Left;   ESOPHAGOGASTRODUODENOSCOPY     Had done dilatation done about 2 or 3 times before in [place]   ESOPHAGOGASTRODUODENOSCOPY (EGD) WITH PROPOFOL N/A 06/23/2019   Procedure: ESOPHAGOGASTRODUODENOSCOPY (EGD) WITH PROPOFOL;  Surgeon: Otis Brace, MD;  [facility];  Service: Gastroenterology;  Laterality: N/A;   FALSE ANEURYSM REPAIR  03/13/2017   redo sternotomy, sterile abscess removal from Dacron graft, omental flap around aorta, CABG: LIMA-LAD ([name], Dr. Mart Piggs)   [place]  2018   Of pericardium 2018   INSERTION OF MESH  10/14/2020   Procedure: INSERTION OF MESH;  Surgeon: Michael Boston, MD;  Location: [facility];  Service: General;;   knee surgeries     13 surgeries on knee done before knee replacement    [place] N/A 05/19/2021   Procedure: LYSIS OF ADHESIONS;  Surgeon: Michael Boston, MD;  Location: [facility];  Service: General;  Laterality: N/A;  GEN & LOCAL   LYSIS OF ADHESION N/A 10/14/2020   Procedure: LYSIS OF ADHESION;  Surgeon: Michael Boston, MD;  Location: [facility];  Service: General;  Laterality: N/A;   open heart surgery     03-13-2017   REPLACEMENT TOTAL KNEE Left 2015   ROTATOR CUFF REPAIR Right    done with biceps tendon repair   TONSILLECTOMY     removed as a child.   TOTAL KNEE ARTHROPLASTY Right 01/18/2022   Procedure: RIGHT TOTAL KNEE ARTHROPLASTY;  Surgeon: Leandrew Koyanagi, MD;  Location: [facility];  Service: Orthopedics;  Laterality: Right;   VENTRAL HERNIA REPAIR N/A 10/14/2020   Procedure: LAPAROSCOPIC VENTRAL HERNIA REPAIR WITH TAP BLOCK BILATERAL;  Surgeon: Michael Boston, MD;  Location: [facility];  Service: General;  Laterality: N/A;   VENTRAL HERNIA REPAIR N/A 05/19/2021   Procedure: LAPAROSCOPIC VENTRAL WALL HERNIA REPAIR;  Surgeon: Michael Boston, MD;  Location: [facility];  Service: General;  Laterality:  N/A;    Allergies  Allergies  Allergen Reactions   Oxycodone Hcl Other (See Comments)    DELUSIONAL   Zetia [Ezetimibe] Other (See Comments)    Leg cramps   Augmentin [Amoxicillin-Pot Clavulanate] Nausea Only    Dizzy   Oxycodone-Acetaminophen Other (See Comments)    DELUSIONAL   Oxytocin Other (See Comments)   Sulfur Other (See Comments)   Chlorhexidine Rash   Elemental Sulfur Rash    Home Medications    Prior to Admission medications   Medication Sig Start Date End Date Taking? Authorizing Provider  amoxicillin (AMOXIL) 500 MG capsule Take 4 capsules by mouth 1 hour before dental appointment 05/18/22     ascorbic acid (VITAMIN C) 500 MG tablet Take 500 mg by mouth daily.    [provider]  aspirin EC 81 MG tablet Take 81 mg by mouth daily. Swallow whole.    [provider]  escitalopram (LEXAPRO) 10 MG tablet Take 1 tablet (10 mg total) by  mouth daily. 07/03/22   Marrian Salvage, FNP  inclisiran (LEQVIO) 284 MG/1.5ML SOSY injection Inject 1.11m at 0 months, 3 months and then every 6 months thereafter. 02/13/22   SJerline Pain MD  levETIRAcetam (KEPPRA) 1000 MG tablet Take 1 tablet (1,000 mg total) by mouth 2 (two) times daily. 07/31/21   YMarcial Pacas MD  lubiprostone (AMITIZA) 8 MCG capsule Take 1 capsule (8 mcg total) by mouth 2 (two) times daily with a meal. 07/31/22   Cirigliano, Vito V, DO  metoprolol succinate (TOPROL-XL) 25 MG 24 hr tablet Take 1/2 tablet (12.5 mg total) by mouth daily. With or immediately following a meal. 02/21/22   SJerline Pain MD  Multiple Vitamins-Minerals (MULTIVITAMIN WITH MINERALS) tablet Take 1 tablet by mouth daily.    [provider]  pravastatin (PRAVACHOL) 40 MG tablet Take 1 tablet (40 mg total) by mouth every evening. 01/19/22   SJerline Pain MD  sildenafil (REVATIO) 20 MG tablet Take 2 -3 tablets by mouth once daily as needed 10/17/21     tamsulosin (FLOMAX) 0.4 MG CAPS capsule Take 2 capsules (0.8 mg  total) by mouth daily. 05/15/22     traZODone (DESYREL) 50 MG tablet Take 0.5-1 tablets (25-50 mg total) by mouth at bedtime as needed for sleep. Patient not taking: Reported on 07/25/2022 05/03/22   MMarrian Salvage FNP    Physical Exam    Vital Signs:  Luther Mell does not have vital signs available for review today.  Given telephonic nature of communication, physical exam is limited. AAOx3. NAD. Normal affect.  Speech and respirations are unlabored.  Accessory Clinical Findings    None  Assessment & Plan    1.  Preoperative Cardiovascular Risk Assessment:  According to the Revised Cardiac Risk Index (RCRI), his Perioperative Risk of Major Cardiac Event is (%): 6.6. His Functional Capacity in METs is: 8.97 according to the Duke Activity Status Index (DASI). Therefore, based on ACC/AHA guidelines, patient would be at acceptable risk for the planned procedure without further cardiovascular testing.   The patient was advised that if he develops new symptoms prior to surgery to contact our office to arrange for a follow-up visit, and he verbalized understanding.  A copy of this note will be routed to requesting surgeon.  Time:   Today, I have spent 4 minutes with the patient with telehealth technology discussing medical history, symptoms, and management plan.     ELenna Sciara NP  08/17/2022, 10:09 AM

## 2022-08-21 ENCOUNTER — Other Ambulatory Visit (HOSPITAL_BASED_OUTPATIENT_CLINIC_OR_DEPARTMENT_OTHER): Payer: Self-pay

## 2022-08-21 ENCOUNTER — Other Ambulatory Visit (HOSPITAL_COMMUNITY): Payer: Self-pay

## 2022-08-21 NOTE — Telephone Encounter (Signed)
Attempted to reach patient to inform that he is cleared by cardiologist on 08/17/22 to have his colonoscopy procedure.

## 2022-08-23 NOTE — Telephone Encounter (Signed)
Attempted to reach patient again. MyChart message sent.

## 2022-08-24 ENCOUNTER — Other Ambulatory Visit (HOSPITAL_COMMUNITY): Payer: Self-pay

## 2022-09-12 ENCOUNTER — Encounter: Payer: Medicare HMO | Admitting: Gastroenterology

## 2022-09-17 ENCOUNTER — Telehealth: Payer: Self-pay | Admitting: *Deleted

## 2022-09-17 NOTE — Telephone Encounter (Signed)
  Erik Salazar,   This pt is a documented difficult intubation and his procedure will need to be done at the hospital.   Thanks,  Osvaldo Angst

## 2022-09-18 ENCOUNTER — Other Ambulatory Visit: Payer: Self-pay | Admitting: Neurology

## 2022-09-18 ENCOUNTER — Other Ambulatory Visit (HOSPITAL_BASED_OUTPATIENT_CLINIC_OR_DEPARTMENT_OTHER): Payer: Self-pay

## 2022-09-18 MED ORDER — LEVETIRACETAM 1000 MG PO TABS
1000.0000 mg | ORAL_TABLET | Freq: Two times a day (BID) | ORAL | 4 refills | Status: DC
  Filled 2022-09-18 – 2022-09-21 (×2): qty 180, 90d supply, fill #0
  Filled 2023-01-01: qty 180, 90d supply, fill #1
  Filled 2023-04-06: qty 180, 90d supply, fill #2

## 2022-09-19 ENCOUNTER — Telehealth: Payer: Self-pay

## 2022-09-19 NOTE — Telephone Encounter (Signed)
Pt noted to be a difficult airway per Osvaldo Angst. Pt will need to be rescheduled at the hospital. I have attached Dr. Bryan Lemma so that he is aware. Pt PV is currently scheduled for 09/25/22. Depending on when he gets an apt his PV may need to be scheduled as well.   Thank you

## 2022-09-20 ENCOUNTER — Telehealth: Payer: Self-pay | Admitting: Pharmacist

## 2022-09-20 ENCOUNTER — Other Ambulatory Visit: Payer: Self-pay

## 2022-09-20 DIAGNOSIS — R195 Other fecal abnormalities: Secondary | ICD-10-CM

## 2022-09-20 DIAGNOSIS — E782 Mixed hyperlipidemia: Secondary | ICD-10-CM

## 2022-09-20 DIAGNOSIS — K5900 Constipation, unspecified: Secondary | ICD-10-CM

## 2022-09-20 NOTE — Telephone Encounter (Signed)
Pt returned call. Pt was understanding of the situation. He is available on Monday, 10/22/22 for colonoscopy at [facility]. Appt is at 8:30 am, pt is to arrive at 7 am with care partner. Pt is aware that he will keep his PV appt as scheduled for instructions. Pt verbalized understanding and had no concerns at the end of the call.

## 2022-09-20 NOTE — Telephone Encounter (Signed)
Called and left patient a detailed vm. I informed patient that he does not meet criteria to have his colonoscopy in the [facility] and this appt has been cancelled. I informed patient that his colonoscopy will need to be completed in a hospital based setting. I informed pt that Dr. Bryan Lemma has availability at [facility] on Monday, 10/22/22. I asked that patient call back to let us know if 3/4 will work for him.

## 2022-09-20 NOTE — Telephone Encounter (Signed)
Patient called stating that he got a bill for >$6000 for his first Leqvio injection. Service center had told us it would be cover 100% and his PA was approved. I spoke with Wes from [employer] who contacted [facility] billing department. Corina from billing called. They are working on an Psychologist, forensic to [employer]. We requested that [place] call pt and let him know what is going on. I called pt and confirmed that [place] called him and said to wait the 30 days for the appeal.

## 2022-09-21 ENCOUNTER — Other Ambulatory Visit (HOSPITAL_BASED_OUTPATIENT_CLINIC_OR_DEPARTMENT_OTHER): Payer: Self-pay

## 2022-09-21 ENCOUNTER — Ambulatory Visit (INDEPENDENT_AMBULATORY_CARE_PROVIDER_SITE_OTHER): Payer: Medicare HMO | Admitting: Family

## 2022-09-21 ENCOUNTER — Encounter: Payer: Self-pay | Admitting: Family

## 2022-09-21 VITALS — BP 118/72 | HR 85 | Temp 97.7°F | Resp 16 | Ht 70.0 in | Wt 179.2 lb

## 2022-09-21 DIAGNOSIS — J019 Acute sinusitis, unspecified: Secondary | ICD-10-CM

## 2022-09-21 MED ORDER — DOXYCYCLINE HYCLATE 100 MG PO TABS
100.0000 mg | ORAL_TABLET | Freq: Two times a day (BID) | ORAL | 0 refills | Status: DC
  Filled 2022-09-21: qty 14, 7d supply, fill #0

## 2022-09-21 NOTE — Progress Notes (Signed)
Erik Salazar is a 72 y.o. male with the following history as recorded in EpicCare:  Patient Active Problem List   Diagnosis Date Noted   Postop check 03/26/2022   Gait abnormality 01/29/2022   Status post total right knee replacement 01/18/2022   Coronary artery disease involving native coronary artery of native heart without angina pectoris 01/04/2022   Preop cardiovascular exam 01/04/2022   Primary osteoarthritis of right knee 01/04/2022   Acute right-sided low back pain without sciatica 12/25/2021   Aortic atherosclerosis ([place]) 09/04/2021   Spondylolisthesis at L4-L5 level 06/28/2021   S/P repair of ventral hernia 05/19/2021   S/P repair of recurrent ventral hernia 05/19/2021   Benign prostatic hyperplasia without lower urinary tract symptoms 10/14/2020   Chronic pain 10/14/2020   ED (erectile dysfunction) of organic origin 10/14/2020   Esophageal dysphagia 10/14/2020   Gastroesophageal reflux disease 10/14/2020   History of aortic valve replacement with bioprosthetic valve 10/14/2020   Hx of aortic aneurysm repair 10/14/2020   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   Thoracic aortic aneurysm without rupture ([place]) 10/14/2020   Recurrent incisional hernia 10/14/2020   Spinal stenosis in cervical region 05/04/2020   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   Cervical radiculopathy at C6 11/14/2017   Carpal tunnel syndrome on right 07/08/2017   Tremor, essential 07/08/2017   Ischemic stroke of frontal lobe ([place]) 04/05/2017   Pain in right hand 04/05/2017   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 03/27/2017   Seizure ([place]) 03/14/2017   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/13/2017   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     aspirin EC 81 MG tablet Take 81 mg by mouth daily. Swallow whole.     doxycycline (VIBRA-TABS) 100 MG tablet Take 1 tablet (100 mg total) by mouth 2 (two) times daily. 14 tablet 0   inclisiran (LEQVIO) 284 MG/1.5ML SOSY injection Inject 1.71m at 0 months, 3 months and then every 6 months thereafter. 1.5 mL 0   levETIRAcetam (KEPPRA) 1000 MG tablet Take 1 tablet (1,000 mg total) by mouth 2 (two) times daily. 180 tablet 4   lubiprostone (AMITIZA) 8 MCG capsule Take 1 capsule (8 mcg total) by mouth 2 (two) times daily with a meal. 180 capsule 3   metoprolol succinate (TOPROL-XL) 25 MG 24 hr tablet Take 1/2 tablet (12.5 mg total) by mouth daily. With or immediately following a meal. 45 tablet 3   Multiple Vitamins-Minerals (MULTIVITAMIN WITH MINERALS) tablet Take 1 tablet by mouth daily.     pravastatin (PRAVACHOL) 40 MG tablet Take 1 tablet (40 mg total) by mouth every evening. 90 tablet 3   sildenafil (REVATIO) 20 MG tablet Take 2 -3 tablets by mouth once daily as needed 30 tablet 1   tamsulosin (FLOMAX) 0.4 MG CAPS capsule Take 2 capsules (0.8 mg total) by mouth daily. 180 capsule 1   No current facility-administered medications for this visit.    Allergies: Oxycodone hcl, Zetia [ezetimibe], Augmentin [amoxicillin-pot clavulanate], Oxycodone-acetaminophen, Oxytocin, Sulfur, Chlorhexidine, and Elemental sulfur  Past Medical History:  Diagnosis Date   Arthritis    RA and OA   Carpal tunnel syndrome on right 07/08/2017   Cervical radiculopathy at  C6 51/70/0174   Right   Complication of anesthesia    Coronary artery disease    Enlarged prostate    Fibromyalgia    H/O: knee surgery    15    History of blood clots    History of kidney stones    History of open heart surgery    ASCENDING AORTIC ANEURYSM   Ischemic stroke of frontal lobe ([place]) 03/14/2017   Bilateral; post-redo CT surgery   Pneumonia    PONV (postoperative nausea  and vomiting)    only after CABG surgeries   Seizure disorder ([place]) 03/14/2017   Seizures ([place])    Status post knee surgery    DVT POST KNEE SURGERY   Stroke [facility])     Past Surgical History:  Procedure Laterality Date   ANTERIOR CERVICAL DECOMP/DISCECTOMY FUSION N/A 05/04/2020   Procedure: Anterior Cervical Decompression/Discectomy Fuion Cervical three-four, Cervical four-five, Cervical five-six;  Surgeon: Kary Kos, MD;  Location: [place];  Service: Neurosurgery;  Laterality: N/A;   BALLOON DILATION N/A 06/23/2019   Procedure: BALLOON DILATION;  Surgeon: Otis Brace, MD;  [facility];  Service: Gastroenterology;  Laterality: N/A;   BENTALL PROCEDURE  01/04/2016   Bentall with 23 mm pericardial AVR; SVG-LAD, SVG-CX ([place])   BICEPS TENDON REPAIR Right    BIOPSY  06/23/2019   Procedure: BIOPSY;  Surgeon: Otis Brace, MD;  [facility];  Service: Gastroenterology;;   CARDIAC SURGERY     ANUERSYM MAY 2017   Bentall procedure. Bioprosthetic aortic valve #23 mm bovine model #2700 TF ask, and 28 mm Gelweave woven vascular sinus of Valsalva graft   CARPAL TUNNEL RELEASE  08/2018   right hand    CORONARY ARTERY BYPASS GRAFT  01/04/2016   VG to LAD & VG to LCX   CORONARY ARTERY BYPASS GRAFT  03/13/2017   LIMA to LAD with steril abcess removal from dacron graft   CYSTOSCOPY/URETEROSCOPY/HOLMIUM LASER/STENT PLACEMENT Left 06/12/2022   Procedure: CYSTOSCOPY/URETEROSCOPY/HOLMIUM LASER/STENT PLACEMENT;  Surgeon: Lucas Mallow, MD;  [facility];  Service: Urology;  Laterality: Left;   ESOPHAGOGASTRODUODENOSCOPY     Had done dilatation done about 2 or 3 times before in [place]   ESOPHAGOGASTRODUODENOSCOPY (EGD) WITH PROPOFOL N/A 06/23/2019   Procedure: ESOPHAGOGASTRODUODENOSCOPY (EGD) WITH PROPOFOL;  Surgeon: Otis Brace, MD;  [facility];  Service: Gastroenterology;  Laterality: N/A;   FALSE ANEURYSM REPAIR  03/13/2017    redo sternotomy, sterile abscess removal from Dacron graft, omental flap around aorta, CABG: LIMA-LAD ([name], Dr. Mart Piggs)   [place]  2018   Of pericardium 2018   INSERTION OF MESH  10/14/2020   Procedure: INSERTION OF MESH;  Surgeon: Michael Boston, MD;  Location: [place];  Service: General;;   knee surgeries     13 surgeries on knee done before knee replacement    [place] N/A 05/19/2021   Procedure: LYSIS OF ADHESIONS;  Surgeon: Michael Boston, MD;  Location: [place];  Service: General;  Laterality: N/A;  GEN & LOCAL   LYSIS OF ADHESION N/A 10/14/2020   Procedure: LYSIS OF ADHESION;  Surgeon: Michael Boston, MD;  Location: [place];  Service: General;  Laterality: N/A;   open heart surgery     03-13-2017   REPLACEMENT TOTAL KNEE Left 2015   ROTATOR CUFF REPAIR Right    done with biceps tendon repair   TONSILLECTOMY     removed as a  child.   TOTAL KNEE ARTHROPLASTY Right 01/18/2022   Procedure: RIGHT TOTAL KNEE ARTHROPLASTY;  Surgeon: Leandrew Koyanagi, MD;  Location: [place];  Service: Orthopedics;  Laterality: Right;   VENTRAL HERNIA REPAIR N/A 10/14/2020   Procedure: LAPAROSCOPIC VENTRAL HERNIA REPAIR WITH TAP BLOCK BILATERAL;  Surgeon: Michael Boston, MD;  Location: [place];  Service: General;  Laterality: N/A;   VENTRAL HERNIA REPAIR N/A 05/19/2021   Procedure: LAPAROSCOPIC VENTRAL WALL HERNIA REPAIR;  Surgeon: Michael Boston, MD;  Location: [place];  Service: General;  Laterality: N/A;    Family History  Problem Relation Age of Onset   Arthritis Mother    Aneurysm Mother        brain aneurysm for mother.    Arthritis Father    Colon cancer Neg Hx    Esophageal cancer Neg Hx     Social History   Tobacco Use   Smoking status: Former    Types: Cigars    Quit date: 2021    Years since quitting: 3.0   Smokeless tobacco: Never   Tobacco comments:    Only smoked cigars for 6 months  Substance Use Topics   Alcohol use: Yes     Alcohol/week: 1.0 standard drink of alcohol    Types: 1 Cans of beer per week    Comment: occassionally    Subjective:   Presents with concerns for possible sinus infection; symptoms x 4 days; notes that typically gets this infection once per year; no fever;   Objective:  Vitals:   09/21/22 0837  BP: 118/72  Pulse: 85  Resp: 16  Temp: 97.7 F (36.5 C)  TempSrc: Oral  SpO2: 98%  Weight: 179 lb 3.2 oz (81.3 kg)  Height: '5\' 10"'$  (1.778 m)    General: Well developed, well nourished, in no acute distress  Skin : Warm and dry.  Head: Normocephalic and atraumatic  Eyes: Sclera and conjunctiva clear; pupils round and reactive to light; extraocular movements intact  Ears: External normal; canals clear; tympanic membranes normal  Oropharynx: Pink, supple. No suspicious lesions  Neck: Supple without thyromegaly, adenopathy  Lungs: Respirations unlabored; clear to auscultation bilaterally without wheeze, rales, rhonchi  CVS exam: normal rate and regular rhythm.  Neurologic: Alert and oriented; speech intact; face symmetrical; moves all extremities well; CNII-XII intact without focal deficit   Assessment:  1. Acute sinusitis, recurrence not specified, unspecified location     Plan:  Rx for Doxycline 100 mg bid x 7 days; increase fluids, rest and follow up worse, no better.   No follow-ups on file.  No orders of the defined types were placed in this encounter.   Requested Prescriptions   Signed Prescriptions Disp Refills   doxycycline (VIBRA-TABS) 100 MG tablet 14 tablet 0    Sig: Take 1 tablet (100 mg total) by mouth 2 (two) times daily.

## 2022-09-25 ENCOUNTER — Other Ambulatory Visit (HOSPITAL_BASED_OUTPATIENT_CLINIC_OR_DEPARTMENT_OTHER): Payer: Self-pay

## 2022-09-25 ENCOUNTER — Encounter: Payer: Self-pay | Admitting: Family

## 2022-09-25 ENCOUNTER — Ambulatory Visit (AMBULATORY_SURGERY_CENTER): Payer: Medicare HMO | Admitting: *Deleted

## 2022-09-25 ENCOUNTER — Other Ambulatory Visit: Payer: Self-pay | Admitting: Family

## 2022-09-25 VITALS — Ht 70.0 in | Wt 176.0 lb

## 2022-09-25 DIAGNOSIS — R195 Other fecal abnormalities: Secondary | ICD-10-CM

## 2022-09-25 DIAGNOSIS — K5900 Constipation, unspecified: Secondary | ICD-10-CM

## 2022-09-25 MED ORDER — PREDNISONE 20 MG PO TABS
20.0000 mg | ORAL_TABLET | Freq: Every day | ORAL | 0 refills | Status: DC
  Filled 2022-09-25: qty 5, 5d supply, fill #0

## 2022-09-25 NOTE — Progress Notes (Signed)
No egg or soy allergy known to patient  No issues known to pt with past sedation with any surgeries or procedures Patient denies ever being told they has issues or difficulty with intubation  No FH of Malignant Hyperthermia Pt is not on diet pills Pt is not on  home 02  Pt is not on blood thinners  Pt was having issues with constipation but stated "since I have been taking that pill he gave me I have 2-3 bowels a day" (amitza) No A fib or A flutter Have any cardiac testing pending--no Pt instructed to use Singlecare.com or GoodRx for a price reduction on prep    Pt.already had clenpiq prep @ home.

## 2022-10-10 ENCOUNTER — Other Ambulatory Visit (HOSPITAL_BASED_OUTPATIENT_CLINIC_OR_DEPARTMENT_OTHER): Payer: Self-pay

## 2022-10-10 MED ORDER — AREXVY 120 MCG/0.5ML IM SUSR
INTRAMUSCULAR | 0 refills | Status: DC
  Filled 2022-10-10: qty 0.5, 1d supply, fill #0

## 2022-10-15 ENCOUNTER — Encounter (HOSPITAL_COMMUNITY): Payer: Self-pay | Admitting: Gastroenterology

## 2022-10-15 NOTE — Progress Notes (Signed)
Attempted to obtain medical history via telephone, unable to reach at this time. HIPAA compliant voicemail message left requesting return call to pre surgical testing department. 

## 2022-10-18 ENCOUNTER — Ambulatory Visit: Payer: Medicare HMO | Admitting: Neurology

## 2022-10-19 ENCOUNTER — Telehealth: Payer: Self-pay | Admitting: Gastroenterology

## 2022-10-19 NOTE — Telephone Encounter (Signed)
Informed patient he can take Amitiza leading up to the colonoscopy but since he is doing his bowel prep, he will not need to take it the day of the procedure. Patient verbalized understanding.

## 2022-10-19 NOTE — Telephone Encounter (Signed)
Inbound call from patients wife requesting a call back to discuss if patient can still take his Amitiza for his procedure on 3/4 at the hospital. Please advise.

## 2022-10-22 ENCOUNTER — Ambulatory Visit (HOSPITAL_COMMUNITY)
Admission: RE | Admit: 2022-10-22 | Discharge: 2022-10-22 | Disposition: A | Discharge status: Home / Self Care | Payer: Medicare HMO | Attending: Gastroenterology | Admitting: Gastroenterology

## 2022-10-22 ENCOUNTER — Encounter (HOSPITAL_COMMUNITY)
Admission: RE | Disposition: A | Discharge status: Home / Self Care | Payer: Self-pay | Source: Home / Self Care | Attending: Gastroenterology

## 2022-10-22 ENCOUNTER — Ambulatory Visit (HOSPITAL_BASED_OUTPATIENT_CLINIC_OR_DEPARTMENT_OTHER): Payer: Medicare HMO | Admitting: Anesthesiology

## 2022-10-22 ENCOUNTER — Ambulatory Visit (HOSPITAL_COMMUNITY): Payer: Medicare HMO | Admitting: Anesthesiology

## 2022-10-22 ENCOUNTER — Other Ambulatory Visit: Payer: Self-pay

## 2022-10-22 ENCOUNTER — Encounter (HOSPITAL_COMMUNITY): Payer: Self-pay | Admitting: Gastroenterology

## 2022-10-22 DIAGNOSIS — M797 Fibromyalgia: Secondary | ICD-10-CM | POA: Insufficient documentation

## 2022-10-22 DIAGNOSIS — Z1211 Encounter for screening for malignant neoplasm of colon: Secondary | ICD-10-CM

## 2022-10-22 DIAGNOSIS — K5900 Constipation, unspecified: Secondary | ICD-10-CM

## 2022-10-22 DIAGNOSIS — R140 Abdominal distension (gaseous): Secondary | ICD-10-CM | POA: Diagnosis not present

## 2022-10-22 DIAGNOSIS — K6289 Other specified diseases of anus and rectum: Secondary | ICD-10-CM | POA: Diagnosis not present

## 2022-10-22 DIAGNOSIS — Z87891 Personal history of nicotine dependence: Secondary | ICD-10-CM | POA: Diagnosis not present

## 2022-10-22 DIAGNOSIS — I2510 Atherosclerotic heart disease of native coronary artery without angina pectoris: Secondary | ICD-10-CM | POA: Insufficient documentation

## 2022-10-22 DIAGNOSIS — Z951 Presence of aortocoronary bypass graft: Secondary | ICD-10-CM | POA: Diagnosis not present

## 2022-10-22 DIAGNOSIS — Z8673 Personal history of transient ischemic attack (TIA), and cerebral infarction without residual deficits: Secondary | ICD-10-CM | POA: Diagnosis not present

## 2022-10-22 DIAGNOSIS — K644 Residual hemorrhoidal skin tags: Secondary | ICD-10-CM

## 2022-10-22 DIAGNOSIS — I10 Essential (primary) hypertension: Secondary | ICD-10-CM

## 2022-10-22 DIAGNOSIS — G40909 Epilepsy, unspecified, not intractable, without status epilepticus: Secondary | ICD-10-CM | POA: Diagnosis not present

## 2022-10-22 HISTORY — PX: COLONOSCOPY WITH PROPOFOL: SHX5780

## 2022-10-22 SURGERY — COLONOSCOPY WITH PROPOFOL
Anesthesia: Monitor Anesthesia Care

## 2022-10-22 MED ORDER — PROPOFOL 10 MG/ML IV BOLUS
INTRAVENOUS | Status: DC | PRN
  Administered 2022-10-22: 10 mg via INTRAVENOUS

## 2022-10-22 MED ORDER — EPHEDRINE SULFATE (PRESSORS) 50 MG/ML IJ SOLN
INTRAMUSCULAR | Status: DC | PRN
  Administered 2022-10-22: 5 mg via INTRAVENOUS
  Administered 2022-10-22: 7 mg via INTRAVENOUS

## 2022-10-22 MED ORDER — SODIUM CHLORIDE 0.9 % IV SOLN: INTRAVENOUS | Status: DC

## 2022-10-22 MED ORDER — LACTATED RINGERS IV SOLN
INTRAVENOUS | Status: AC | PRN
  Administered 2022-10-22: 1000 mL via INTRAVENOUS

## 2022-10-22 MED ORDER — PROPOFOL 1000 MG/100ML IV EMUL
INTRAVENOUS | Status: AC
  Filled 2022-10-22: qty 100

## 2022-10-22 MED ORDER — PROPOFOL 500 MG/50ML IV EMUL
INTRAVENOUS | Status: DC | PRN
  Administered 2022-10-22: 120 ug/kg/min via INTRAVENOUS

## 2022-10-22 MED ORDER — LIDOCAINE HCL 1 % IJ SOLN
INTRAMUSCULAR | Status: DC | PRN
  Administered 2022-10-22: 50 mg via INTRADERMAL

## 2022-10-22 SURGICAL SUPPLY — 22 items

## 2022-10-22 NOTE — Interval H&P Note (Signed)
History and Physical Interval Note:  10/22/2022 8:05 AM  Erik Salazar  has presented today for surgery, with the diagnosis of constipation, change in stool.  The various methods of treatment have been discussed with the patient and family. After consideration of risks, benefits and other options for treatment, the patient has consented to  Procedure(s): COLONOSCOPY WITH PROPOFOL (N/A) as a surgical intervention.  The patient's history has been reviewed, patient examined, no change in status, stable for surgery.  I have reviewed the patient's chart and labs.  Questions were answered to the patient's satisfaction.     Dominic Pea Vershawn Westrup

## 2022-10-22 NOTE — Transfer of Care (Signed)
Immediate Anesthesia Transfer of Care Note  Patient: Erik Salazar  Procedure(s) Performed: COLONOSCOPY WITH PROPOFOL  Patient Location: Short Stay  Anesthesia Type:MAC  Level of Consciousness: awake, alert , and patient cooperative  Airway & Oxygen Therapy: Patient Spontanous Breathing and Patient connected to face mask oxygen  Post-op Assessment: Report given to RN, Post -op Vital signs reviewed and stable, and Patient moving all extremities X 4  Post vital signs: Reviewed and stable  Last Vitals:  Vitals Value Taken Time  BP    Temp    Pulse    Resp    SpO2      Last Pain:  Vitals:   10/22/22 0710  TempSrc: Tympanic  PainSc: 0-No pain         Complications: No notable events documented.

## 2022-10-22 NOTE — H&P (Addendum)
GASTROENTEROLOGY PROCEDURE H&P NOTE   Primary Care Physician: Marrian Salvage, [place]    Reason for Procedure:  Constipation, bloating, change in bowel habits, colon cancer screening  Plan:    Colonoscopy  Patient is appropriate for endoscopic procedure(s) at [facility]oscopy unit.  The nature of the procedure, as well as the risks, benefits, and alternatives were carefully and thoroughly reviewed with the patient. Ample time for discussion and questions allowed. The patient understood, was satisfied, and agreed to proceed.     HPI: Erik Salazar is a 72 y.o. male who presents for colonoscopy for ongoing CRC screening along with evaluation of constipation, abdominal bloating, change in bowel habits.  Last colonoscopy was in [place] in 2017 and was recommended to repeat in 5 years (no records available for review).  Also developed constipation abdominal bloating over the last year.  Has been treated with lactulose, MiraLAX, then transitioned to [place]. Has had a good response to daily dosing.   Plan for colonoscopy today at [facility] due to history of difficult intubation along with underlying comorbidities.  Past Medical History:  Diagnosis Date   Arthritis    RA and OA   Blood transfusion without reported diagnosis    with 2nd heart surgery   Carpal tunnel syndrome on right 07/08/2017   Cervical radiculopathy at C6 [id number]   Right   Complication of anesthesia    Coronary artery disease    Enlarged prostate    Fibromyalgia    H/O: knee surgery    15    Hayfever    "WHEN YOUNG"   History of blood clots    History of kidney stones    History of open heart surgery    ASCENDING AORTIC ANEURYSM   Ischemic stroke of frontal lobe ([place]) 03/14/2017   Bilateral; post-redo CT surgery   Pneumonia    PONV (postoperative nausea and vomiting)    only after CABG surgeries   Seizure disorder ([place]) 03/14/2017   Seizures ([place])    last one 02/2021,updated 09/25/22    Status post knee surgery    DVT POST KNEE SURGERY   Stroke [facility])     Past Surgical History:  Procedure Laterality Date   ANTERIOR CERVICAL DECOMP/DISCECTOMY FUSION N/A 05/04/2020   Procedure: Anterior Cervical Decompression/Discectomy Fuion Cervical three-four, Cervical four-five, Cervical five-six;  Surgeon: Kary Kos, MD;  Location: [place];  Service: Neurosurgery;  Laterality: N/A;   BACK SURGERY     lower back 06/28/2021   BALLOON DILATION N/A 06/23/2019   Procedure: BALLOON DILATION;  Surgeon: Otis Brace, MD;  [facility];  Service: Gastroenterology;  Laterality: N/A;   BENTALL PROCEDURE  01/04/2016   Bentall with 23 mm pericardial AVR; SVG-LAD, SVG-CX ([place])   BICEPS TENDON REPAIR Right    BIOPSY  06/23/2019   Procedure: BIOPSY;  Surgeon: Otis Brace, MD;  [facility];  Service: Gastroenterology;;   CARDIAC SURGERY     ANUERSYM MAY 2017   Bentall procedure. Bioprosthetic aortic valve #23 mm bovine model #2700 TF ask, and 28 mm Gelweave woven vascular sinus of Valsalva graft   CARPAL TUNNEL RELEASE  08/2018   right hand    CORONARY ARTERY BYPASS GRAFT  01/04/2016   VG to LAD & VG to LCX   CORONARY ARTERY BYPASS GRAFT  03/13/2017   LIMA to LAD with steril abcess removal from dacron graft   CYSTOSCOPY/URETEROSCOPY/HOLMIUM LASER/STENT PLACEMENT Left 06/12/2022   Procedure: CYSTOSCOPY/URETEROSCOPY/HOLMIUM LASER/STENT PLACEMENT;  Surgeon: Lucas Mallow, MD;  [facility];  Service: Urology;  Laterality: Left;   ESOPHAGOGASTRODUODENOSCOPY     Had done dilatation done about 2 or 3 times before in [place]   ESOPHAGOGASTRODUODENOSCOPY (EGD) WITH PROPOFOL N/A 06/23/2019   Procedure: ESOPHAGOGASTRODUODENOSCOPY (EGD) WITH PROPOFOL;  Surgeon: Otis Brace, MD;  [facility];  Service: Gastroenterology;  Laterality: N/A;   FALSE ANEURYSM REPAIR  03/13/2017   redo sternotomy, sterile abscess removal from Dacron  graft, omental flap around aorta, CABG: LIMA-LAD ([name], Dr. Mart Piggs)   [place]  2018   Of pericardium 2018   INSERTION OF MESH  10/14/2020   Procedure: INSERTION OF MESH;  Surgeon: Michael Boston, MD;  Location: [place];  Service: General;;   knee surgeries     13 surgeries on knee done before knee replacement    [place] N/A 05/19/2021   Procedure: LYSIS OF ADHESIONS;  Surgeon: Michael Boston, MD;  Location: [place];  Service: General;  Laterality: N/A;  GEN & LOCAL   LYSIS OF ADHESION N/A 10/14/2020   Procedure: LYSIS OF ADHESION;  Surgeon: Michael Boston, MD;  Location: [place];  Service: General;  Laterality: N/A;   open heart surgery     03-13-2017   REPLACEMENT TOTAL KNEE Left 2015   ROTATOR CUFF REPAIR Right    done with biceps tendon repair   TONSILLECTOMY     removed as a child.   TOTAL KNEE ARTHROPLASTY Right 01/18/2022   Procedure: RIGHT TOTAL KNEE ARTHROPLASTY;  Surgeon: Leandrew Koyanagi, MD;  Location: [place];  Service: Orthopedics;  Laterality: Right;   VENTRAL HERNIA REPAIR N/A 10/14/2020   Procedure: LAPAROSCOPIC VENTRAL HERNIA REPAIR WITH TAP BLOCK BILATERAL;  Surgeon: Michael Boston, MD;  Location: [place];  Service: General;  Laterality: N/A;   VENTRAL HERNIA REPAIR N/A 05/19/2021   Procedure: LAPAROSCOPIC VENTRAL WALL HERNIA REPAIR;  Surgeon: Michael Boston, MD;  Location: [place];  Service: General;  Laterality: N/A;    Prior to Admission medications   Medication Sig Start Date End Date Taking? Authorizing Provider  acetaminophen (TYLENOL) 325 MG tablet Take 650 mg by mouth every 6 (six) hours as needed for moderate pain.   Yes [provider]  ascorbic acid (VITAMIN C) 500 MG tablet Take 500 mg by mouth daily.   Yes [provider]  aspirin EC 81 MG tablet Take 81 mg by mouth daily. Swallow whole.   Yes [provider]  levETIRAcetam (KEPPRA) 1000 MG tablet Take 1 tablet (1,000 mg  total) by mouth 2 (two) times daily. 09/18/22  Yes Marcial Pacas, MD  lubiprostone (AMITIZA) 8 MCG capsule Take 1 capsule (8 mcg total) by mouth 2 (two) times daily with a meal. Patient taking differently: Take 8 mcg by mouth daily. 07/31/22  Yes Kimberlee Shoun V, DO  metoprolol succinate (TOPROL-XL) 25 MG 24 hr tablet Take 1/2 tablet (12.5 mg total) by mouth daily. With or immediately following a meal. 02/21/22  Yes Skains, Thana Farr, MD  Multiple Vitamins-Minerals (MULTIVITAMIN WITH MINERALS) tablet Take 1 tablet by mouth daily.   Yes [provider]  pravastatin (PRAVACHOL) 40 MG tablet Take 1 tablet (40 mg total) by mouth every evening. 01/19/22  Yes Jerline Pain, MD  RSV vaccine recomb adjuvanted (AREXVY) 120 MCG/0.5ML injection Inject into the muscle. 10/10/22  Yes Carlyle Basques, MD  sildenafil (REVATIO) 20 MG tablet Take 2 -3 tablets by mouth  once daily as needed 10/17/21  Yes   tamsulosin (FLOMAX) 0.4 MG CAPS capsule Take 2 capsules (0.8 mg total) by mouth daily. 05/15/22  Yes   amoxicillin (AMOXIL) 500 MG capsule Take 4 capsules by mouth 1 hour before dental appointment 05/18/22     doxycycline (VIBRA-TABS) 100 MG tablet Take 1 tablet (100 mg total) by mouth 2 (two) times daily. Patient not taking: Reported on 10/15/2022 09/21/22   Marrian Salvage, FNP  inclisiran [facility]) 284 MG/1.5ML SOSY injection Inject 284 mg into the skin every 6 (six) months. 02/13/22   Jerline Pain, MD  predniSONE (DELTASONE) 20 MG tablet Take 1 tablet (20 mg total) by mouth daily with breakfast. Patient not taking: Reported on 10/17/2022 09/25/22   Marrian Salvage, FNP    Current Facility-Administered Medications  Medication Dose Route Frequency Provider Last Rate Last Admin   0.9 %  sodium chloride infusion   Intravenous Continuous Jerrell Mangel V, DO       lactated ringers infusion    Continuous PRN Cacie Gaskins V, DO 10 mL/hr at 10/22/22 0723 1,000 mL at 10/22/22 0723    Allergies as of  09/20/2022 - Review Complete 08/17/2022  Allergen Reaction Noted   Oxycodone hcl Other (See Comments) 11/27/2016   Zetia [ezetimibe] Other (See Comments) 02/02/2020   Augmentin [amoxicillin-pot clavulanate] Nausea Only 05/09/2021   Oxycodone-acetaminophen Other (See Comments) 11/27/2016   Oxytocin Other (See Comments) 06/12/2022   Sulfur Other (See Comments) 06/12/2022   Chlorhexidine Rash 06/26/2021   Elemental sulfur Rash 11/27/2016    Family History  Problem Relation Age of Onset   Ulcerative colitis Mother    Arthritis Mother    Aneurysm Mother        brain aneurysm for mother.    Arthritis Father    Ulcerative colitis Sister    Colon cancer Neg Hx    Esophageal cancer Neg Hx    Colon polyps Neg Hx    Crohn's disease Neg Hx    Rectal cancer Neg Hx    Stomach cancer Neg Hx     Social History   Socioeconomic History   Marital status: Married    Spouse name: Not on file   Number of children: 3   Years of education: Not on file   Highest education level: Not on file  Occupational History   Occupation: RETIRED  Tobacco Use   Smoking status: Former    Types: Cigars    Quit date: 2021    Years since quitting: 3.1   Smokeless tobacco: Never   Tobacco comments:    Only smoked cigars for 6 months  Vaping Use   Vaping Use: Never used  Substance and Sexual Activity   Alcohol use: Yes    Alcohol/week: 1.0 standard drink of alcohol    Types: 1 Cans of beer per week    Comment: occassionally   Drug use: No   Sexual activity: Not on file  Other Topics Concern   Not on file  Social History Narrative   Lives at home with wife, is retired.  Education: 2 yrs college.   2 Children.   Social Determinants of Health   Financial Resource Strain: Low Risk  (12/21/2021)   Overall Financial Resource Strain (CARDIA)    Difficulty of Paying Living Expenses: Not hard at all  Food Insecurity: No Food Insecurity (12/21/2021)   Hunger Vital Sign    Worried About Running Out of Food  in the Last Year: Never true  Ran Out of Food in the Last Year: Never true  Transportation Needs: No Transportation Needs (12/21/2021)   PRAPARE - Hydrologist (Medical): No    Lack of Transportation (Non-Medical): No  Physical Activity: Sufficiently Active (12/21/2021)   Exercise Vital Sign    Days of Exercise per Week: 7 days    Minutes of Exercise per Session: 60 min  Stress: No Stress Concern Present (12/21/2021)   [place]    Feeling of Stress : Not at all  Social Connections: [place] (12/21/2021)   Social Connection and Isolation Panel [NHANES]    Frequency of Communication with Friends and Family: More than three times a week    Frequency of Social Gatherings with Friends and Family: More than three times a week    Attends Religious Services: More than 4 times per year    Active Member of [employer] or Organizations: Yes    Attends Music therapist: More than 4 times per year    Marital Status: Married  Human resources officer Violence: Not At Risk (12/21/2021)   Humiliation, Afraid, Rape, and Kick questionnaire    Fear of Current or Ex-Partner: No    Emotionally Abused: No    Physically Abused: No    Sexually Abused: No    Physical Exam: Vital signs in last 24 hours: '@BP'$  (!) 127/51   Pulse 91   Temp 97.6 F (36.4 C) (Tympanic)   Resp 12   Ht '5\' 10"'$  (1.778 m)   Wt 79.8 kg   SpO2 98%   BMI 25.24 kg/m  GEN: NAD EYE: Sclerae anicteric ENT: MMM CV: Non-tachycardic Pulm: CTA b/l GI: Soft, NT/ND NEURO:  Alert & Oriented x [place], DO [facility]   10/22/2022 7:37 AM

## 2022-10-22 NOTE — Anesthesia Postprocedure Evaluation (Signed)
Anesthesia Post Note  Patient: Erik Salazar  Procedure(s) Performed: COLONOSCOPY WITH PROPOFOL     Patient location during evaluation: Endoscopy Anesthesia Type: MAC Level of consciousness: oriented, awake and alert and awake Pain management: pain level controlled Vital Signs Assessment: post-procedure vital signs reviewed and stable Respiratory status: spontaneous breathing, nonlabored ventilation, respiratory function stable and patient connected to nasal cannula oxygen Cardiovascular status: blood pressure returned to baseline and stable Postop Assessment: no headache, no backache and no apparent nausea or vomiting Anesthetic complications: no   No notable events documented.  Last Vitals:  Vitals:   10/22/22 0852 10/22/22 0902  BP: (!) 121/53 (!) 126/56  Pulse: 88 81  Resp: 16 18  Temp:    SpO2: 96% 96%    Last Pain:  Vitals:   10/22/22 0902  TempSrc:   PainSc: 0-No pain                 Santa Lighter

## 2022-10-22 NOTE — Discharge Instructions (Signed)
YOU HAD AN ENDOSCOPIC PROCEDURE TODAY: Refer to the procedure report and other information in the discharge instructions given to you for any specific questions about what was found during the examination. If this information does not answer your questions, please call [facility] office at [phone] to clarify.  ° °YOU SHOULD EXPECT: Some feelings of bloating in the abdomen. Passage of more gas than usual. Walking can help get rid of the air that was put into your GI tract during the procedure and reduce the bloating. If you had a lower endoscopy (such as a colonoscopy or flexible sigmoidoscopy) you may notice spotting of blood in your stool or on the toilet paper. Some abdominal soreness may be present for a day or two, also. ° °DIET: Your first meal following the procedure should be a light meal and then it is ok to progress to your normal diet. A half-sandwich or bowl of soup is an example of a good first meal. Heavy or fried foods are harder to digest and may make you feel nauseous or bloated. Drink plenty of fluids but you should avoid alcoholic beverages for 24 hours. If you had a esophageal dilation, please see attached instructions for diet.   ° °ACTIVITY: Your care partner should take you home directly after the procedure. You should plan to take it easy, moving slowly for the rest of the day. You can resume normal activity the day after the procedure however YOU SHOULD NOT DRIVE, use power tools, machinery or perform tasks that involve climbing or major physical exertion for 24 hours (because of the sedation medicines used during the test).  ° °SYMPTOMS TO REPORT IMMEDIATELY: °A gastroenterologist can be reached at any hour. Please call [phone]  for any of the following symptoms:  °Following lower endoscopy (colonoscopy, flexible sigmoidoscopy) °Excessive amounts of blood in the stool  °Significant tenderness, worsening of abdominal pains  °Swelling of the abdomen that is new, acute  °Fever of 100° or  higher  °Following upper endoscopy (EGD, EUS, ERCP, esophageal dilation) °Vomiting of blood or coffee ground material  °New, significant abdominal pain  °New, significant chest pain or pain under the shoulder blades  °Painful or persistently difficult swallowing  °New shortness of breath  °Black, tarry-looking or red, bloody stools ° °FOLLOW UP:  °If any biopsies were taken you will be contacted by phone or by letter within the next 1-3 weeks. Call [phone]  if you have not heard about the biopsies in 3 weeks.  °Please also call with any specific questions about appointments or follow up tests. ° °

## 2022-10-22 NOTE — Op Note (Signed)
[facility] Patient Name: Erik Salazar Procedure Date: 10/22/2022 MRN: [id number] Attending MD: Gerrit Heck , MD, [id number] Date of Birth: 1951-01-13 CSN: [id number] Age: 72 Admit Type: Outpatient Procedure:                Colonoscopy Indications:              Screening for colorectal malignant neoplasm. Last                            colonoscopy was in [place] in 2017 and recommended 5                            year repeat (results unknown and not available for                            review otherwise).                           Incidentally, he also has had change in bowel                            habits with constipation and abdominal bloating. Providers:                Gerrit Heck, MD, Jaci Carrel, RN, Gloris Ham, Technician Referring MD:              Medicines:                Monitored Anesthesia Care Complications:            No immediate complications. Estimated Blood Loss:     Estimated blood loss: none. Procedure:                Pre-Anesthesia Assessment:                           - Prior to the procedure, a History and Physical                            was performed, and patient medications and                            allergies were reviewed. The patient's tolerance of                            previous anesthesia was also reviewed. The risks                            and benefits of the procedure and the sedation                            options and risks were discussed with the patient.                            All questions  were answered, and informed consent                            was obtained. Prior Anticoagulants: The patient has                            taken no anticoagulant or antiplatelet agents. ASA                            Grade Assessment: IV - A patient with severe                            systemic disease that is a constant threat to life.                            After  reviewing the risks and benefits, the patient                            was deemed in satisfactory condition to undergo the                            procedure.                           After obtaining informed consent, the colonoscope                            was passed under direct vision. Throughout the                            procedure, the patient's blood pressure, pulse, and                            oxygen saturations were monitored continuously. The                            [id number]) Olympus colonoscope was                            introduced through the anus and advanced to the the                            cecum, identified by appendiceal orifice and                            ileocecal valve. The colonoscopy was performed                            without difficulty. The patient tolerated the                            procedure well. The quality of the bowel  preparation was excellent. The ileocecal valve,                            appendiceal orifice, and rectum were photographed. Scope In: 8:26:16 AM Scope Out: 8:37:25 AM Scope Withdrawal Time: 0 hours 8 minutes 54 seconds  Total Procedure Duration: 0 hours 11 minutes 9 seconds  Findings:      Skin tags were found on perianal exam.      The entire colon appeared normal.      Anal papilla(e) were hypertrophied. Impression:               - Perianal skin tags found on perianal exam.                           - The entire examined colon is normal.                           - Anal papilla(e) were hypertrophied.                           - No specimens collected. Moderate Sedation:      Not Applicable - Patient had care per Anesthesia. Recommendation:           - Patient has a contact number available for                            emergencies. The signs and symptoms of potential                            delayed complications were discussed with the                             patient. Return to normal activities tomorrow.                            Written discharge instructions were provided to the                            patient.                           - Resume previous diet.                           - Continue present medications.                           - Repeat colonoscopy in 10 years for screening                            purposes.                           - Continue Amitiza.                           - Return to GI office PRN. Procedure Code(s):        ---  Professional ---                           [id number], Colorectal cancer screening; colonoscopy on                            individual not meeting criteria for high risk Diagnosis Code(s):        --- Professional ---                           Z12.11, Encounter for screening for malignant                            neoplasm of colon                           K62.89, Other specified diseases of anus and rectum                           K64.4, Residual hemorrhoidal skin tags CPT copyright 2022 American Medical Association. All rights reserved. The codes documented in this report are preliminary and upon coder review may  be revised to meet current compliance requirements. Gerrit Heck, MD 10/22/2022 8:47:38 AM Number of Addenda: 0

## 2022-10-22 NOTE — Anesthesia Preprocedure Evaluation (Addendum)
Anesthesia Evaluation  Patient identified by MRN, date of birth, ID band Patient awake    Reviewed: Allergy & Precautions, NPO status , Patient's Chart, lab work & pertinent test results, reviewed documented beta blocker date and time   History of Anesthesia Complications (+) PONV, DIFFICULT AIRWAY and history of anesthetic complications  Airway Mallampati: II  TM Distance: <3 FB Neck ROM: Full    Dental  (+) Teeth Intact, Dental Advisory Given   Pulmonary former smoker   Pulmonary exam normal breath sounds clear to auscultation       Cardiovascular hypertension, Pt. on home beta blockers + CAD and + CABG  Normal cardiovascular exam Rhythm:Regular Rate:Normal     Neuro/Psych Seizures -,   Neuromuscular disease CVA    GI/Hepatic Neg liver ROS,GERD  ,,constipation, change in stool   Endo/Other  negative endocrine ROS    Renal/GU negative Renal ROS     Musculoskeletal  (+) Arthritis ,  Fibromyalgia -  Abdominal   Peds  Hematology negative hematology ROS (+)   Anesthesia Other Findings Day of surgery medications reviewed with the patient.  Reproductive/Obstetrics                             Anesthesia Physical Anesthesia Plan  ASA: 4  Anesthesia Plan: MAC   Post-op Pain Management: Minimal or no pain anticipated   Induction: Intravenous  PONV Risk Score and Plan: 2 and TIVA and Treatment may vary due to age or medical condition  Airway Management Planned: Natural Airway and Simple Face Mask  Additional Equipment:   Intra-op Plan:   Post-operative Plan:   Informed Consent: I have reviewed the patients History and Physical, chart, labs and discussed the procedure including the risks, benefits and alternatives for the proposed anesthesia with the patient or authorized representative who has indicated his/her understanding and acceptance.     Dental advisory given  Plan  Discussed with: CRNA and Anesthesiologist  Anesthesia Plan Comments:         Anesthesia Quick Evaluation

## 2022-10-24 ENCOUNTER — Encounter: Payer: Medicare HMO | Admitting: Gastroenterology

## 2022-10-25 ENCOUNTER — Encounter (HOSPITAL_COMMUNITY): Payer: Self-pay | Admitting: Gastroenterology

## 2022-11-09 ENCOUNTER — Other Ambulatory Visit (HOSPITAL_BASED_OUTPATIENT_CLINIC_OR_DEPARTMENT_OTHER): Payer: Self-pay

## 2022-11-09 MED ORDER — TAMSULOSIN HCL 0.4 MG PO CAPS
0.8000 mg | ORAL_CAPSULE | Freq: Every day | ORAL | 1 refills | Status: DC
  Filled 2022-11-09: qty 180, 90d supply, fill #0
  Filled 2023-02-05: qty 180, 90d supply, fill #1

## 2022-11-19 ENCOUNTER — Other Ambulatory Visit (HOSPITAL_BASED_OUTPATIENT_CLINIC_OR_DEPARTMENT_OTHER): Payer: Self-pay

## 2022-11-19 MED ORDER — NEOMYCIN-POLYMYXIN-DEXAMETH 3.5-10000-0.1 OP OINT
1.0000 | TOPICAL_OINTMENT | Freq: Every day | OPHTHALMIC | 0 refills | Status: DC
  Filled 2022-11-19: qty 3.5, 3d supply, fill #0

## 2022-11-29 NOTE — Telephone Encounter (Signed)
Spoke with patient. Advised that we will update his PA Will need to speak with [facility] and confirm that his appeal was overturned.

## 2022-11-29 NOTE — Telephone Encounter (Signed)
Pt called back for an update, informed him I have sent a message an hour ago, she is in and out with patients, will c/b back as soon as she can.

## 2022-11-29 NOTE — Telephone Encounter (Signed)
Pt calling back about this matter, asked to speak with you

## 2022-11-29 NOTE — Telephone Encounter (Signed)
Chaka from [facility] called in today, she states PA expired August 03, 2022. She states another PA is required for his next injection. Informed her per last message there is an appeal in progress and I spoke with the pt twice today, the pharmacist will call him back as soon as she can.

## 2022-11-30 ENCOUNTER — Other Ambulatory Visit: Payer: Self-pay | Admitting: Pharmacist

## 2022-11-30 NOTE — Telephone Encounter (Signed)
Per [facility] billing, pt bill from 06/11/22 was paid by [employer].  Called to do PA but new labs are needed. I spoke with patient and gave him this information.  We will switch him over to the [facility] for his Leqvio injections.  This way they can help [place] verify prior authorization and lab orders are placed.  Patient is aware that we have canceled his appointment on 4/23 at the hospital and he will be contacted by the infusion center to schedule an appointment once his prior authorization is approved. Patient will go on Monday to [facility] to get his labs done. Patient appreciative of the call.

## 2022-11-30 NOTE — Progress Notes (Signed)
Pt previously getting injection at hosptial. He is on q 6 month injections I have started him PA. He needs labs in order to complete. He will get labs on Monday.

## 2022-12-04 ENCOUNTER — Telehealth: Payer: Self-pay | Admitting: Pharmacist

## 2022-12-04 LAB — LIPID PANEL
Chol/HDL Ratio: 2.9 ratio (ref 0.0–5.0)
Cholesterol, Total: 116 mg/dL (ref 100–199)
HDL: 40 mg/dL (ref >39)
LDL Chol Calc (NIH): 54 mg/dL (ref 0–99)
Triglycerides: 123 mg/dL (ref 0–149)
VLDL Cholesterol Cal: 22 mg/dL (ref 5–40)

## 2022-12-04 NOTE — Telephone Encounter (Signed)
Prior authorization from [employer] approved through 12/04/23. Patient made aware that infusion center will confirm coverage and will reach out to him for scheduling.

## 2022-12-05 ENCOUNTER — Telehealth: Payer: Self-pay | Admitting: Pharmacy Technician

## 2022-12-05 NOTE — Telephone Encounter (Signed)
Melissa, Patient will be scheduled as soon as possible. Thanks Selena Batten

## 2022-12-05 NOTE — Telephone Encounter (Signed)
Auth Submission: APPROVED Site of care: Site of care: [facility] Payer: [employer] Medication & CPT/J Code(s) submitted: Leqvio (Inclisiran) [id number] Route of submission (phone, fax, portal):  Phone # Fax # Auth type: Buy/Bill Units/visits requested: 3 DOSES Reference number: [id number] Approval from: 12/04/22 to 12/04/23   Patient has met deductible and OOP.  Leqvio is coverdd 100%

## 2022-12-11 ENCOUNTER — Ambulatory Visit (INDEPENDENT_AMBULATORY_CARE_PROVIDER_SITE_OTHER): Payer: Medicare HMO

## 2022-12-11 ENCOUNTER — Telehealth: Payer: Self-pay | Admitting: Family

## 2022-12-11 ENCOUNTER — Encounter (HOSPITAL_COMMUNITY): Payer: Medicare HMO

## 2022-12-11 VITALS — BP 123/77 | HR 77 | Temp 97.5°F | Resp 16 | Ht 70.0 in | Wt 181.6 lb

## 2022-12-11 DIAGNOSIS — I639 Cerebral infarction, unspecified: Secondary | ICD-10-CM | POA: Diagnosis not present

## 2022-12-11 DIAGNOSIS — E782 Mixed hyperlipidemia: Secondary | ICD-10-CM

## 2022-12-11 DIAGNOSIS — I2510 Atherosclerotic heart disease of native coronary artery without angina pectoris: Secondary | ICD-10-CM

## 2022-12-11 MED ORDER — INCLISIRAN SODIUM 284 MG/1.5ML ~~LOC~~ SOSY
284.0000 mg | PREFILLED_SYRINGE | Freq: Once | SUBCUTANEOUS | Status: AC
  Administered 2022-12-11: 284 mg via SUBCUTANEOUS
  Filled 2022-12-11: qty 1.5

## 2022-12-11 NOTE — Telephone Encounter (Signed)
Contacted Shaheim Shatto to schedule their annual wellness visit. Appointment made for 12/27/2022.  Verlee Rossetti; Care Guide Ambulatory Clinical Support [place] [facility] Direct Dial: [phone]

## 2022-12-11 NOTE — Patient Instructions (Signed)
Inclisiran Injection What is this medication? INCLISIRAN (in kli SIR an) treats high cholesterol. It works by decreasing bad cholesterol (such as LDL) in your blood. Changes to diet and exercise are often combined with this medication. This medicine may be used for other purposes; ask your health care provider or pharmacist if you have questions. COMMON BRAND NAME(S): LEQVIO What should I tell my care team before I take this medication? They need to know if you have any of these conditions: An unusual or allergic reaction to inclisiran, other medications, foods, dyes, or preservatives Pregnant or trying to get pregnant Breast-feeding How should I use this medication? This medication is injected under the skin. It is given by your care team in a hospital or clinic setting. Talk to your care team about the use of this medication in children. Special care may be needed. Overdosage: If you think you have taken too much of this medicine contact a poison control center or emergency room at once. NOTE: This medicine is only for you. Do not share this medicine with others. What if I miss a dose? Keep appointments for follow-up doses. It is important not to miss your dose. Call your care team if you are unable to keep an appointment. What may interact with this medication? Interactions are not expected. This list may not describe all possible interactions. Give your health care provider a list of all the medicines, herbs, non-prescription drugs, or dietary supplements you use. Also tell them if you smoke, drink alcohol, or use illegal drugs. Some items may interact with your medicine. What should I watch for while using this medication? Visit your care team for regular checks on your progress. Tell your care team if your symptoms do not start to get better or if they get worse. You may need blood work while you are taking this medication. What side effects may I notice from receiving this  medication? Side effects that you should report to your care team as soon as possible: Allergic reactions--skin rash, itching, hives, swelling of the face, lips, tongue, or throat Side effects that usually do not require medical attention (report these to your care team if they continue or are bothersome): Joint pain Pain, redness, or irritation at injection site This list may not describe all possible side effects. Call your doctor for medical advice about side effects. You may report side effects to FDA at 1-800-FDA-1088. Where should I keep my medication? This medication is given in a hospital or clinic. It will not be stored at home. NOTE: This sheet is a summary. It may not cover all possible information. If you have questions about this medicine, talk to your doctor, pharmacist, or health care provider.  2023 Elsevier/Gold Standard (2020-08-24 00:00:00)  

## 2022-12-11 NOTE — Progress Notes (Signed)
Diagnosis: Hyperlipidemia  Provider:  Chilton Greathouse MD  Procedure: Injection  Leqvio (inclisiran), Dose: 284 mg, Site: subcutaneous, Number of injections: 1  Post Care: Patient declined observation  Discharge: Condition: Good, Destination: Home . AVS Provided  Performed by:  Adriana Mccallum, RN

## 2022-12-13 ENCOUNTER — Other Ambulatory Visit (HOSPITAL_BASED_OUTPATIENT_CLINIC_OR_DEPARTMENT_OTHER): Payer: Self-pay

## 2022-12-13 MED ORDER — THERATEARS STERILID CLEANSER EX SOLN
CUTANEOUS | 0 refills | Status: DC
  Filled 2022-12-13: qty 59.2, 20d supply, fill #0

## 2022-12-13 MED ORDER — TOBRADEX 0.3-0.1 % OP OINT
1.0000 | TOPICAL_OINTMENT | Freq: Every day | OPHTHALMIC | 0 refills | Status: DC
  Filled 2022-12-13: qty 3.5, 3d supply, fill #0

## 2022-12-14 ENCOUNTER — Other Ambulatory Visit (HOSPITAL_BASED_OUTPATIENT_CLINIC_OR_DEPARTMENT_OTHER): Payer: Self-pay

## 2022-12-27 ENCOUNTER — Ambulatory Visit (INDEPENDENT_AMBULATORY_CARE_PROVIDER_SITE_OTHER): Payer: Medicare HMO | Admitting: *Deleted

## 2022-12-27 DIAGNOSIS — Z0000 Encounter for general adult medical examination without abnormal findings: Secondary | ICD-10-CM

## 2022-12-27 NOTE — Patient Instructions (Signed)
Erik Salazar , Thank you for taking time to come for your Medicare Wellness Visit. I appreciate your ongoing commitment to your health goals. Please review the following plan we discussed and let me know if I can assist you in the future.   These are the goals we discussed:  Goals   None     This is a list of the screening recommended for you and due dates:  Health Maintenance  Topic Date Due   Hepatitis C Screening: USPSTF Recommendation to screen - Ages 41-79 yo.  Never done   Zoster (Shingles) Vaccine (1 of 2) Never done   Pneumonia Vaccine (2 of 2 - PCV) 05/21/2017   COVID-19 Vaccine (8 - 2023-24 season) 08/01/2022   Flu Shot  03/21/2023   Medicare Annual Wellness Visit  12/27/2023   DTaP/Tdap/Td vaccine (2 - Td or Tdap) 11/15/2026   Colon Cancer Screening  10/21/2032   HPV Vaccine  Aged Out     Next appointment: Follow up in one year for your annual wellness visit.   Preventive Care 38 Years and Older, Male Preventive care refers to lifestyle choices and visits with your health care provider that can promote health and wellness. What does preventive care include? A yearly physical exam. This is also called an annual well check. Dental exams once or twice a year. Routine eye exams. Ask your health care provider how often you should have your eyes checked. Personal lifestyle choices, including: Daily care of your teeth and gums. Regular physical activity. Eating a healthy diet. Avoiding tobacco and drug use. Limiting alcohol use. Practicing safe sex. Taking low doses of aspirin every day. Taking vitamin and mineral supplements as recommended by your health care provider. What happens during an annual well check? The services and screenings done by your health care provider during your annual well check will depend on your age, overall health, lifestyle risk factors, and family history of disease. Counseling  Your health care provider may ask you questions about  your: Alcohol use. Tobacco use. Drug use. Emotional well-being. Home and relationship well-being. Sexual activity. Eating habits. History of falls. Memory and ability to understand (cognition). Work and work Astronomer. Screening  You may have the following tests or measurements: Height, weight, and BMI. Blood pressure. Lipid and cholesterol levels. These may be checked every 5 years, or more frequently if you are over 76 years old. Skin check. Lung cancer screening. You may have this screening every year starting at age 49 if you have a 30-pack-year history of smoking and currently smoke or have quit within the past 15 years. Fecal occult blood test (FOBT) of the stool. You may have this test every year starting at age 18. Flexible sigmoidoscopy or colonoscopy. You may have a sigmoidoscopy every 5 years or a colonoscopy every 10 years starting at age 59. Prostate cancer screening. Recommendations will vary depending on your family history and other risks. Hepatitis C blood test. Hepatitis B blood test. Sexually transmitted disease (STD) testing. Diabetes screening. This is done by checking your blood sugar (glucose) after you have not eaten for a while (fasting). You may have this done every 1-3 years. Abdominal aortic aneurysm (AAA) screening. You may need this if you are a current or former smoker. Osteoporosis. You may be screened starting at age 11 if you are at high risk. Talk with your health care provider about your test results, treatment options, and if necessary, the need for more tests. Vaccines  Your health care provider  may recommend certain vaccines, such as: Influenza vaccine. This is recommended every year. Tetanus, diphtheria, and acellular pertussis (Tdap, Td) vaccine. You may need a Td booster every 10 years. Zoster vaccine. You may need this after age 65. Pneumococcal 13-valent conjugate (PCV13) vaccine. One dose is recommended after age 55. Pneumococcal  polysaccharide (PPSV23) vaccine. One dose is recommended after age 59. Talk to your health care provider about which screenings and vaccines you need and how often you need them. This information is not intended to replace advice given to you by your health care provider. Make sure you discuss any questions you have with your health care provider. Document Released: 09/02/2015 Document Revised: 04/25/2016 Document Reviewed: 06/07/2015 Elsevier Interactive Patient Education  2017 [employer] Prevention in the Home Falls can cause injuries. They can happen to people of all ages. There are many things you can do to make your home safe and to help prevent falls. What can I do on the outside of my home? Regularly fix the edges of walkways and driveways and fix any cracks. Remove anything that might make you trip as you walk through a door, such as a raised step or threshold. Trim any bushes or trees on the path to your home. Use bright outdoor lighting. Clear any walking paths of anything that might make someone trip, such as rocks or tools. Regularly check to see if handrails are loose or broken. Make sure that both sides of any steps have handrails. Any raised decks and porches should have guardrails on the edges. Have any leaves, snow, or ice cleared regularly. Use sand or salt on walking paths during winter. Clean up any spills in your garage right away. This includes oil or grease spills. What can I do in the bathroom? Use night lights. Install grab bars by the toilet and in the tub and shower. Do not use towel bars as grab bars. Use non-skid mats or decals in the tub or shower. If you need to sit down in the shower, use a plastic, non-slip stool. Keep the floor dry. Clean up any water that spills on the floor as soon as it happens. Remove soap buildup in the tub or shower regularly. Attach bath mats securely with double-sided non-slip rug tape. Do not have throw rugs and other  things on the floor that can make you trip. What can I do in the bedroom? Use night lights. Make sure that you have a light by your bed that is easy to reach. Do not use any sheets or blankets that are too big for your bed. They should not hang down onto the floor. Have a firm chair that has side arms. You can use this for support while you get dressed. Do not have throw rugs and other things on the floor that can make you trip. What can I do in the kitchen? Clean up any spills right away. Avoid walking on wet floors. Keep items that you use a lot in easy-to-reach places. If you need to reach something above you, use a strong step stool that has a grab bar. Keep electrical cords out of the way. Do not use floor polish or wax that makes floors slippery. If you must use wax, use non-skid floor wax. Do not have throw rugs and other things on the floor that can make you trip. What can I do with my stairs? Do not leave any items on the stairs. Make sure that there are handrails on both sides  of the stairs and use them. Fix handrails that are broken or loose. Make sure that handrails are as long as the stairways. Check any carpeting to make sure that it is firmly attached to the stairs. Fix any carpet that is loose or worn. Avoid having throw rugs at the top or bottom of the stairs. If you do have throw rugs, attach them to the floor with carpet tape. Make sure that you have a light switch at the top of the stairs and the bottom of the stairs. If you do not have them, ask someone to add them for you. What else can I do to help prevent falls? Wear shoes that: Do not have high heels. Have rubber bottoms. Are comfortable and fit you well. Are closed at the toe. Do not wear sandals. If you use a stepladder: Make sure that it is fully opened. Do not climb a closed stepladder. Make sure that both sides of the stepladder are locked into place. Ask someone to hold it for you, if possible. Clearly  mark and make sure that you can see: Any grab bars or handrails. First and last steps. Where the edge of each step is. Use tools that help you move around (mobility aids) if they are needed. These include: Canes. Walkers. Scooters. Crutches. Turn on the lights when you go into a dark area. Replace any light bulbs as soon as they burn out. Set up your furniture so you have a clear path. Avoid moving your furniture around. If any of your floors are uneven, fix them. If there are any pets around you, be aware of where they are. Review your medicines with your doctor. Some medicines can make you feel dizzy. This can increase your chance of falling. Ask your doctor what other things that you can do to help prevent falls. This information is not intended to replace advice given to you by your health care provider. Make sure you discuss any questions you have with your health care provider. Document Released: 06/02/2009 Document Revised: 01/12/2016 Document Reviewed: 09/10/2014 Elsevier Interactive Patient Education  2017 [employer].

## 2022-12-27 NOTE — Progress Notes (Signed)
Subjective:  Pt completed ADLs, Fall risk, and SDOH during e-check-in on 12/20/22.  Answers verified with pt.    Erik Salazar is a 72 y.o. male who presents for Medicare Annual/Subsequent preventive examination.  I connected with  Erik Salazar on 12/27/22 by a audio enabled telemedicine application and verified that I am speaking with the correct person using two identifiers.  Patient Location: Home  Provider Location: Office/Clinic  I discussed the limitations of evaluation and management by telemedicine. The patient expressed understanding and agreed to proceed.   Review of Systems     Cardiac Risk Factors include: advanced age (>45men, >69 women);dyslipidemia;hypertension;male gender     Objective:    There were no vitals filed for this visit. There is no height or weight on file to calculate BMI.     12/27/2022    2:19 PM 10/22/2022    6:56 AM 06/23/2022    1:54 PM 04/03/2022    8:07 AM 02/01/2022   10:57 AM 01/11/2022    8:25 AM 12/22/2021    8:07 AM  Advanced Directives  Does Patient Have a Medical Advance Directive? Yes No No Yes Yes Yes Yes  Type of Estate agent of [place];Living will   Healthcare Power of [place];Living will;Out of facility DNR (pink MOST or yellow form) Healthcare Power of [place];Living will;Out of facility DNR (pink MOST or yellow form) Healthcare Power of [place];Living will;Out of facility DNR (pink MOST or yellow form) Healthcare Power of [place];Out of facility DNR (pink MOST or yellow form);Living will  Does patient want to make changes to medical advance directive? No - Patient declined    No - Patient declined No - Patient declined   Copy of Healthcare Power of Attorney in Chart? Yes - validated most recent copy scanned in chart (See row information)   Yes - validated most recent copy scanned in chart (See row information) Yes - validated most recent copy scanned in chart (See row information) Yes - validated most recent  copy scanned in chart (See row information) No - copy requested  Would patient like information on creating a medical advance directive?  No - Patient declined         Current Medications (verified) Outpatient Encounter Medications as of 12/27/2022  Medication Sig   acetaminophen (TYLENOL) 325 MG tablet Take 650 mg by mouth every 6 (six) hours as needed for moderate pain.   amoxicillin (AMOXIL) 500 MG capsule Take 4 capsules by mouth 1 hour before dental appointment   ascorbic acid (VITAMIN C) 500 MG tablet Take 500 mg by mouth daily.   aspirin EC 81 MG tablet Take 81 mg by mouth daily. Swallow whole.   doxycycline (VIBRA-TABS) 100 MG tablet Take 1 tablet (100 mg total) by mouth 2 (two) times daily. (Patient not taking: Reported on 10/15/2022)   Eyelid Cleansers (THERATEARS STERILID CLEANSER) SOLN Apply a small amount to skin twice a day. Scrub eye lids with foam then rinse.   inclisiran (LEQVIO) 284 MG/1.5ML SOSY injection Inject 284 mg into the skin every 6 (six) months.   levETIRAcetam (KEPPRA) 1000 MG tablet Take 1 tablet (1,000 mg total) by mouth 2 (two) times daily.   lubiprostone (AMITIZA) 8 MCG capsule Take 1 capsule (8 mcg total) by mouth 2 (two) times daily with a meal. (Patient taking differently: Take 8 mcg by mouth daily.)   metoprolol succinate (TOPROL-XL) 25 MG 24 hr tablet Take 1/2 tablet (12.5 mg total) by mouth daily. With or immediately following a  meal.   Multiple Vitamins-Minerals (MULTIVITAMIN WITH MINERALS) tablet Take 1 tablet by mouth daily.   pravastatin (PRAVACHOL) 40 MG tablet Take 1 tablet (40 mg total) by mouth every evening.   predniSONE (DELTASONE) 20 MG tablet Take 1 tablet (20 mg total) by mouth daily with breakfast. (Patient not taking: Reported on 10/17/2022)   RSV vaccine recomb adjuvanted (AREXVY) 120 MCG/0.5ML injection Inject into the muscle.   sildenafil (REVATIO) 20 MG tablet Take 2 -3 tablets by mouth once daily as needed   tamsulosin (FLOMAX) 0.4 MG CAPS  capsule Take 2 capsules (0.8 mg total) by mouth daily.   tobramycin-dexamethasone (TOBRADEX) ophthalmic ointment Place 1 thin later into the left eye daily.   [DISCONTINUED] neomycin-polymyxin b-dexamethasone (MAXITROL) 3.5-10000-0.1 OINT Place a thin layer into the left eye once daily.   No facility-administered encounter medications on file as of 12/27/2022.    Allergies (verified) Oxycodone hcl, Zetia [ezetimibe], Augmentin [amoxicillin-pot clavulanate], Oxytocin, Chlorhexidine, Elemental sulfur, and Sulfur   History: Past Medical History:  Diagnosis Date   Allergy see attached info   Arthritis    RA and OA   Blood transfusion without reported diagnosis    with 2nd heart surgery   Carpal tunnel syndrome on right 07/08/2017   Cervical radiculopathy at C6 11/14/2017   Right   Complication of anesthesia    Coronary artery disease    Enlarged prostate    Fibromyalgia    H/O: knee surgery    15    Hayfever    "WHEN YOUNG"   History of blood clots    History of kidney stones    History of open heart surgery    ASCENDING AORTIC ANEURYSM   Ischemic stroke of frontal lobe (HCC) 03/14/2017   Bilateral; post-redo CT surgery   Pneumonia    PONV (postoperative nausea and vomiting)    only after CABG surgeries   Seizure disorder (HCC) 03/14/2017   Seizures (HCC)    last one 02/2021,updated 09/25/22   Status post knee surgery    DVT POST KNEE SURGERY   Stroke [facility])    Past Surgical History:  Procedure Laterality Date   ANTERIOR CERVICAL DECOMP/DISCECTOMY FUSION N/A 05/04/2020   Procedure: Anterior Cervical Decompression/Discectomy Fuion Cervical three-four, Cervical four-five, Cervical five-six;  Surgeon: Donalee Citrin, MD;  Location: [facility] OR;  Service: Neurosurgery;  Laterality: N/A;   BACK SURGERY     lower back 06/28/2021   BALLOON DILATION N/A 06/23/2019   Procedure: BALLOON DILATION;  Surgeon: Kathi Der, MD;  Location: [facility];  Service: Gastroenterology;  Laterality:  N/A;   BENTALL PROCEDURE  01/04/2016   Bentall with 23 mm pericardial AVR; SVG-LAD, SVG-CX [facility] [place])   BICEPS TENDON REPAIR Right    BIOPSY  06/23/2019   Procedure: BIOPSY;  Surgeon: Kathi Der, MD;  Location: [facility];  Service: Gastroenterology;;   CARDIAC SURGERY     ANUERSYM MAY 2017   Bentall procedure. Bioprosthetic aortic valve #23 mm bovine model #2700 TF ask, and 28 mm Gelweave woven vascular sinus of Valsalva graft   CARDIAC VALVE REPLACEMENT  02/2016, 02/2017   CARPAL TUNNEL RELEASE  08/2018   right hand    COLONOSCOPY WITH PROPOFOL N/A 10/22/2022   Procedure: COLONOSCOPY WITH PROPOFOL;  Surgeon: Shellia Cleverly, DO;  Location: [facility];  Service: Gastroenterology;  Laterality: N/A;   CORONARY ARTERY BYPASS GRAFT  01/04/2016   VG to LAD & VG to LCX   CORONARY ARTERY BYPASS GRAFT  03/13/2017   LIMA  to LAD with steril abcess removal from dacron graft   CYSTOSCOPY/URETEROSCOPY/HOLMIUM LASER/STENT PLACEMENT Left 06/12/2022   Procedure: CYSTOSCOPY/URETEROSCOPY/HOLMIUM LASER/STENT PLACEMENT;  Surgeon: Crista Elliot, MD;  Location: [facility];  Service: Urology;  Laterality: Left;   ESOPHAGOGASTRODUODENOSCOPY     Had done dilatation done about 2 or 3 times before in [place]   ESOPHAGOGASTRODUODENOSCOPY (EGD) WITH PROPOFOL N/A 06/23/2019   Procedure: ESOPHAGOGASTRODUODENOSCOPY (EGD) WITH PROPOFOL;  Surgeon: Kathi Der, MD;  Location[facility];  Service: Gastroenterology;  Laterality: N/A;   FALSE ANEURYSM REPAIR  03/13/2017   redo sternotomy, sterile abscess removal from Dacron graft, omental flap around aorta, CABG: LIMA-LAD ([name], Dr. Elroy Channel)   GREATER OMENTAL FLAP CLOSURE  2018   Of pericardium 2018   INSERTION OF MESH  10/14/2020   Procedure: INSERTION OF MESH;  Surgeon: Karie Soda, MD;  Location: [facility] OR;  Service: General;;   JOINT REPLACEMENT  knee   knee surgeries     13 surgeries on knee done before knee replacement     KNEE SURGERY  1983   LAPAROSCOPIC LYSIS OF ADHESIONS N/A 05/19/2021   Procedure: LYSIS OF ADHESIONS;  Surgeon: Karie Soda, MD;  Location: [facility] OR;  Service: General;  Laterality: N/A;  GEN & LOCAL   LYSIS OF ADHESION N/A 10/14/2020   Procedure: LYSIS OF ADHESION;  Surgeon: Karie Soda, MD;  Location: [facility] OR;  Service: General;  Laterality: N/A;   open heart surgery     03-13-2017   REPLACEMENT TOTAL KNEE Left 2015   ROTATOR CUFF REPAIR Right    done with biceps tendon repair   SPINE SURGERY  upper neck, lower spine   TONSILLECTOMY     removed as a child.   TOTAL KNEE ARTHROPLASTY Right 01/18/2022   Procedure: RIGHT TOTAL KNEE ARTHROPLASTY;  Surgeon: Tarry Kos, MD;  Location: [facility] OR;  Service: Orthopedics;  Laterality: Right;   VENTRAL HERNIA REPAIR N/A 10/14/2020   Procedure: LAPAROSCOPIC VENTRAL HERNIA REPAIR WITH TAP BLOCK BILATERAL;  Surgeon: Karie Soda, MD;  Location: [facility] OR;  Service: General;  Laterality: N/A;   VENTRAL HERNIA REPAIR N/A 05/19/2021   Procedure: LAPAROSCOPIC VENTRAL WALL HERNIA REPAIR;  Surgeon: Karie Soda, MD;  Location: [facility] OR;  Service: General;  Laterality: N/A;   Family History  Problem Relation Age of Onset   Ulcerative colitis Mother    Arthritis Mother    Aneurysm Mother        brain aneurysm for mother.    Arthritis Father    Ulcerative colitis Sister    Colon cancer Neg Hx    Esophageal cancer Neg Hx    Colon polyps Neg Hx    Crohn's disease Neg Hx    Rectal cancer Neg Hx    Stomach cancer Neg Hx    Social History   Socioeconomic History   Marital status: Married    Spouse name: Not on file   Number of children: 3   Years of education: Not on file   Highest education level: Not on file  Occupational History   Occupation: RETIRED  Tobacco Use   Smoking status: Former    Types: Cigars    Quit date: 2021    Years since quitting: 3.3   Smokeless tobacco: Never   Tobacco comments:    Only smoked cigars for 6 months  Vaping Use    Vaping Use: Never used  Substance and Sexual Activity   Alcohol use: Yes    Alcohol/week: 1.0 standard drink of  alcohol    Types: 1 Cans of beer per week    Comment: occassionally   Drug use: No   Sexual activity: Not on file  Other Topics Concern   Not on file  Social History Narrative   Lives at home with wife, is retired.  Education: 2 yrs college.   2 Children.   Social Determinants of Health   Financial Resource Strain: Low Risk  (12/20/2022)   Overall Financial Resource Strain (CARDIA)    Difficulty of Paying Living Expenses: Not hard at all  Food Insecurity: No Food Insecurity (12/20/2022)   Hunger Vital Sign    Worried About Running Out of Food in the Last Year: Never true    Ran Out of Food in the Last Year: Never true  Transportation Needs: No Transportation Needs (12/20/2022)   PRAPARE - Administrator, Civil Service (Medical): No    Lack of Transportation (Non-Medical): No  Physical Activity: Sufficiently Active (12/20/2022)   Exercise Vital Sign    Days of Exercise per Week: 4 days    Minutes of Exercise per Session: 50 min  Stress: Stress Concern Present (12/20/2022)   [employer] of Occupational Health - Occupational Stress Questionnaire    Feeling of Stress : To some extent  Social Connections: Unknown (12/20/2022)   Social Connection and Isolation Panel [NHANES]    Frequency of Communication with Friends and Family: More than three times a week    Frequency of Social Gatherings with Friends and Family: More than three times a week    Attends Religious Services: Not on Marketing executive or Organizations: Yes    Attends Banker Meetings: 1 to 4 times per year    Marital Status: Married    Tobacco Counseling Counseling given: Not Answered Tobacco comments: Only smoked cigars for 6 months   Clinical Intake:  Pre-visit preparation completed: Yes  Pain : No/denies pain  Nutritional Risks: None Diabetes: No  How often  do you need to have someone help you when you read instructions, pamphlets, or other written materials from your doctor or pharmacy?: 4 - Often   Activities of Daily Living    12/20/2022   10:05 AM 06/12/2022    5:03 PM  In your present state of health, do you have any difficulty performing the following activities:  Hearing? 0 0  Vision? 0 0  Difficulty concentrating or making decisions? 1 0  Walking or climbing stairs? 0 0  Dressing or bathing? 0 0  Doing errands, shopping? 0   Preparing Food and eating ? N   Using the Toilet? N   In the past six months, have you accidently leaked urine? N   Do you have problems with loss of bowel control? N   Managing your Medications? N   Managing your Finances? N   Housekeeping or managing your Housekeeping? N     Patient Care Team: Olive Bass, FNP as PCP - General (Internal Medicine) Shirleen Schirmer, PA-C as Physician Assistant (Internal Medicine) Lovett Sox, MD as Consulting Physician (Cardiothoracic Surgery) Kathi Der, MD as Consulting Physician (Gastroenterology) Karie Soda, MD as Consulting Physician (General Surgery) Tarry Kos, MD as Attending Physician (Orthopedic Surgery)  Indicate any recent Medical Services you may have received from other than [facility] providers in the past year (date may be approximate).     Assessment:   This is a routine wellness examination for Erik Salazar.  Hearing/Vision screen No results found.  Dietary issues and exercise activities discussed: Current Exercise Habits: Home exercise routine, Type of exercise: walking, Time (Minutes): 20, Frequency (Times/Week): 4, Weekly Exercise (Minutes/Week): 80, Intensity: Moderate, Exercise limited by: neurologic condition(s)   Goals Addressed   None    Depression Screen    12/27/2022    2:28 PM 09/21/2022    8:41 AM 07/03/2022    2:09 PM 05/03/2022    1:05 PM 12/21/2021    9:57 AM 12/21/2021    9:55 AM 10/20/2021    8:21 AM  PHQ 2/9  Scores  PHQ - 2 Score 0 0 2 0 0 0 1  PHQ- 9 Score   4        Fall Risk    12/20/2022   10:05 AM 09/21/2022    8:40 AM 07/03/2022    1:05 PM 05/03/2022    1:05 PM 12/21/2021   10:07 AM  Fall Risk   Falls in the past year? 0 0 0 0 0  Number falls in past yr: 0 0 0 0 0  Injury with Fall? 0 0 0 0 0  Risk for fall due to : No Fall Risks No Fall Risks No Fall Risks No Fall Risks No Fall Risks  Follow up Falls evaluation completed Falls evaluation completed Falls evaluation completed Falls evaluation completed Falls evaluation completed    FALL RISK PREVENTION PERTAINING TO THE HOME:  Any stairs in or around the home? No  Home free of loose throw rugs in walkways, pet beds, electrical cords, etc? Yes  Adequate lighting in your home to reduce risk of falls? Yes   ASSISTIVE DEVICES UTILIZED TO PREVENT FALLS:  Life alert? No  Use of a cane, walker or w/c? No  Grab bars in the bathroom? Yes  Shower chair or bench in shower?  Built in seat Elevated toilet seat or a handicapped toilet? Yes   TIMED UP AND GO:  Was the test performed?  No, audio visit .   Cognitive Function:    12/27/2022    2:32 PM  MMSE - Mini Mental State Exam  Not completed: Unable to complete        12/21/2021    9:59 AM  6CIT Screen  What Year? 0 points  What month? 0 points  What time? 0 points  Count back from 20 0 points  Months in reverse 0 points  Repeat phrase 0 points  Total Score 0 points    Immunizations Immunization History  Administered Date(s) Administered   COVID-19, mRNA, vaccine(Comirnaty)12 years and older 06/06/2022   PFIZER(Purple Top)SARS-COV-2 Vaccination 10/12/2019, 11/02/2019, 11/23/2020   Pfizer Covid-19 Vaccine Bivalent Booster 8yrs & up 05/10/2021, 12/18/2021   Pneumococcal Polysaccharide-23 05/21/2016   Respiratory Syncytial Virus Vaccine,Recomb Aduvanted(Arexvy) 10/10/2022   Tdap 11/14/2016   Zoster, Live 08/01/2017, 11/07/2017    TDAP status: Up to date  Flu Vaccine  status: Up to date  Pneumococcal vaccine status: Due, Education has been provided regarding the importance of this vaccine. Advised may receive this vaccine at local pharmacy or Health Dept. Aware to provide a copy of the vaccination record if obtained from local pharmacy or Health Dept. Verbalized acceptance and understanding.  Covid-19 vaccine status: Information provided on how to obtain vaccines.   Qualifies for Shingles Vaccine? Yes   Zostavax completed Yes   Shingrix Completed?: No.    Education has been provided regarding the importance of this vaccine. Patient has been advised to call insurance company to determine out of pocket expense if they  have not yet received this vaccine. Advised may also receive vaccine at local pharmacy or Health Dept. Verbalized acceptance and understanding.  Screening Tests Health Maintenance  Topic Date Due   Hepatitis C Screening  Never done   Zoster Vaccines- Shingrix (1 of 2) Never done   Pneumonia Vaccine 65+ Years old (2 of 2 - PCV) 05/21/2017   COVID-19 Vaccine (8 - 2023-24 season) 08/01/2022   Medicare Annual Wellness (AWV)  12/22/2022   INFLUENZA VACCINE  03/21/2023   DTaP/Tdap/Td (2 - Td or Tdap) 11/15/2026   COLONOSCOPY (Pts 45-65yrs Insurance coverage will need to be confirmed)  10/21/2032   HPV VACCINES  Aged Out    Health Maintenance  Health Maintenance Due  Topic Date Due   Hepatitis C Screening  Never done   Zoster Vaccines- Shingrix (1 of 2) Never done   Pneumonia Vaccine 69+ Years old (2 of 2 - PCV) 05/21/2017   COVID-19 Vaccine (8 - 2023-24 season) 08/01/2022   Medicare Annual Wellness (AWV)  12/22/2022    Colorectal cancer screening: Type of screening: Colonoscopy. Completed 10/22/22. Repeat every 10 years  Lung Cancer Screening: (Low Dose CT Chest recommended if Age 7-80 years, 30 pack-year currently smoking OR have quit w/in 15years.) does not qualify.   Additional Screening:  Hepatitis C Screening: does qualify;  Completed N/a  Vision Screening: Recommended annual ophthalmology exams for early detection of glaucoma and other disorders of the eye. Is the patient up to date with their annual eye exam?  Yes  Who is the provider or what is the name of the office in which the patient attends annual eye exams? Dr. Karleen Hampshire If pt is not established with a provider, would they like to be referred to a provider to establish care? No .   Dental Screening: Recommended annual dental exams for proper oral hygiene  Community Resource Referral / Chronic Care Management: CRR required this visit?  No   CCM required this visit?  No      Plan:     I have personally reviewed and noted the following in the patient's chart:   Medical and social history Use of alcohol, tobacco or illicit drugs  Current medications and supplements including opioid prescriptions. Patient is not currently taking opioid prescriptions. Functional ability and status Nutritional status Physical activity Advanced directives List of other physicians Hospitalizations, surgeries, and ER visits in previous 12 months Vitals Screenings to include cognitive, depression, and falls Referrals and appointments  In addition, I have reviewed and discussed with patient certain preventive protocols, quality metrics, and best practice recommendations. A written personalized care plan for preventive services as well as general preventive health recommendations were provided to patient.   Due to this being a telephonic visit, the after visit summary with patients personalized plan was offered to patient via mail or my-chart.  Patient would like to access on my-chart.  Donne Anon, [place]   12/27/2022   Nurse Notes: None

## 2023-01-01 ENCOUNTER — Other Ambulatory Visit: Payer: Self-pay

## 2023-01-01 ENCOUNTER — Other Ambulatory Visit: Payer: Self-pay | Admitting: Cardiology

## 2023-01-01 ENCOUNTER — Other Ambulatory Visit (HOSPITAL_BASED_OUTPATIENT_CLINIC_OR_DEPARTMENT_OTHER): Payer: Self-pay

## 2023-01-01 MED ORDER — PRAVASTATIN SODIUM 40 MG PO TABS
40.0000 mg | ORAL_TABLET | Freq: Every evening | ORAL | 1 refills | Status: DC
  Filled 2023-01-01: qty 90, 90d supply, fill #0
  Filled 2023-03-13 – 2023-03-28 (×2): qty 90, 90d supply, fill #1

## 2023-01-15 ENCOUNTER — Encounter: Payer: Self-pay | Admitting: Cardiology

## 2023-01-15 ENCOUNTER — Ambulatory Visit: Payer: Medicare HMO | Attending: Cardiology | Admitting: Cardiology

## 2023-01-15 VITALS — BP 100/70 | HR 84 | Ht 70.0 in | Wt 178.0 lb

## 2023-01-15 DIAGNOSIS — Z8679 Personal history of other diseases of the circulatory system: Secondary | ICD-10-CM

## 2023-01-15 DIAGNOSIS — Z9889 Other specified postprocedural states: Secondary | ICD-10-CM

## 2023-01-15 DIAGNOSIS — Z789 Other specified health status: Secondary | ICD-10-CM | POA: Diagnosis not present

## 2023-01-15 DIAGNOSIS — I2510 Atherosclerotic heart disease of native coronary artery without angina pectoris: Secondary | ICD-10-CM

## 2023-01-15 DIAGNOSIS — E782 Mixed hyperlipidemia: Secondary | ICD-10-CM | POA: Diagnosis not present

## 2023-01-15 NOTE — Patient Instructions (Addendum)
Medication Instructions:  The current medical regimen is effective;  continue present plan and medications.  *If you need a refill on your cardiac medications before your next appointment, please call your pharmacy*   Follow-Up: At [facility], you and your health needs are our priority.  As part of our continuing mission to provide you with exceptional heart care, we have created designated Provider Care Teams.  These Care Teams include your primary Cardiologist (physician) and Advanced Practice Providers (APPs -  Physician Assistants and Nurse Practitioners) who all work together to provide you with the care you need, when you need it.  We recommend signing up for the patient portal called "MyChart".  Sign up information is provided on this After Visit Summary.  MyChart is used to connect with patients for Virtual Visits (Telemedicine).  Patients are able to view lab/test results, encounter notes, upcoming appointments, etc.  Non-urgent messages can be sent to your provider as well.   To learn more about what you can do with MyChart, go to [email or web address].    Your next appointment:   1 year(s)  Provider:   Dr Mark Skains      

## 2023-01-15 NOTE — Progress Notes (Signed)
Cardiology Office Note:    Date:  01/15/2023   ID:  Erik Salazar, DOB April 05, 1951, MRN [id number]  PCP:  Olive Bass, FNP   [facility] Providers Cardiologist:  Donato Schultz, MD     Referring MD: Olive Bass,*    History of Present Illness:    Erik Salazar is a 72 y.o. male  here for the follow-up of coronary artery disease, CABG, and AVR/Bentall, statin intolerance.   Resection of a ascending aneurysm with Bentall procedure and CABG  in [place] in 2017. After moving here, a follow up CTA by Dr. Maren Beach demonstrated a probable false aneurysm of the distal suture line of the aortic graft.  It measures over 6 cm in length.  Patient asked for a second opinion at [facility].  The patient subsequently underwent redo sternotomy in the summer 2018 at [facility].  A 9 cm abscess collection along the graft was found.  There is no false aneurysm.  Gram stain of this area was negative.  The abscess was debrided and the space was filled with omental flap performed at the same time by plastic surgery.  Patient also had a repeat coronary graft using left IMA to LAD for a stenotic vein graft from the original operation.   He had a postoperative CVA with expressive aphasia and some swallowing difficulty.  He was being followed here by Dr. Anne Hahn.  He has undergone physical therapy and speech therapy.  He had improved and now has a fairly good functional status. He has declined follow-up at [facility] and wishes to be followed [facility] for his aortic cardiac surgery.   Follow up CT shows resolution of abscess (had dye extravasation). ECHO with normal AV function. Plan is to have one year CTA by Dr. Maren Beach.   2019 - ER Chest pain and SOB - negative work up. 2020 - ER left upper back pain - repair stable. Descending infrarenal dissection medically managed.  2022 - Ventral hernia repair Dr. Michaell Cowing. Cervical discectomy.  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  Did have kidney stone.  Taking inclisiran.  Excellent results.  Did have some nasal drainage after the injections.  Upon his first injection he had a bill for $8000 which was rectified.  Saw Dr. Maren Beach last, note reviewed in August 2023.  Maintaining CT scans once a year.    Past Medical History:  Diagnosis Date   Allergy see attached info   Arthritis    RA and OA   Blood transfusion without reported diagnosis    with 2nd heart surgery   Carpal tunnel syndrome on right 07/08/2017   Cervical radiculopathy at C6 11/14/2017   Right   Complication of anesthesia    Coronary artery disease    Enlarged prostate    Fibromyalgia    H/O: knee surgery    15    Hayfever    "WHEN YOUNG"   History of blood clots    History of kidney stones    History of open heart surgery    ASCENDING AORTIC ANEURYSM   Ischemic stroke of frontal lobe (HCC) 03/14/2017   Bilateral; post-redo CT surgery   Pneumonia    PONV (postoperative nausea and vomiting)    only after CABG surgeries   Seizure disorder (HCC) 03/14/2017   Seizures (HCC)    last one 02/2021,updated 09/25/22   Status post knee surgery    DVT POST KNEE SURGERY   Stroke (HCC)  Past Surgical History:  Procedure Laterality Date   ANTERIOR CERVICAL DECOMP/DISCECTOMY FUSION N/A 05/04/2020   Procedure: Anterior Cervical Decompression/Discectomy Fuion Cervical three-four, Cervical four-five, Cervical five-six;  Surgeon: Donalee Citrin, MD;  [facility] OR;  Service: Neurosurgery;  Laterality: N/A;   BACK SURGERY     lower back 06/28/2021   BALLOON DILATION N/A 06/23/2019   Procedure: BALLOON DILATION;  Surgeon: Kathi Der, MD;  Location: [facility] ENDOSCOPY;  Service: Gastroenterology;  Laterality: N/A;   BENTALL PROCEDURE  01/04/2016   Bentall with 23 mm pericardial AVR; SVG-LAD, SVG-CX [facility] [place])   BICEPS TENDON REPAIR Right    BIOPSY  06/23/2019   Procedure: BIOPSY;  Surgeon: Kathi Der, MD;   Location: [facility] ENDOSCOPY;  Service: Gastroenterology;;   CARDIAC SURGERY     ANUERSYM MAY 2017   Bentall procedure. Bioprosthetic aortic valve #23 mm bovine model #2700 TF ask, and 28 mm Gelweave woven vascular sinus of Valsalva graft   CARDIAC VALVE REPLACEMENT  02/2016, 02/2017   CARPAL TUNNEL RELEASE  08/2018   right hand    COLONOSCOPY WITH PROPOFOL N/A 10/22/2022   Procedure: COLONOSCOPY WITH PROPOFOL;  Surgeon: Shellia Cleverly, DO;  Location: [facility];  Service: Gastroenterology;  Laterality: N/A;   CORONARY ARTERY BYPASS GRAFT  01/04/2016   VG to LAD & VG to LCX   CORONARY ARTERY BYPASS GRAFT  03/13/2017   LIMA to LAD with steril abcess removal from dacron graft   CYSTOSCOPY/URETEROSCOPY/HOLMIUM LASER/STENT PLACEMENT Left 06/12/2022   Procedure: CYSTOSCOPY/URETEROSCOPY/HOLMIUM LASER/STENT PLACEMENT;  Surgeon: Crista Elliot, MD;  Location: [facility] ORS;  Service: Urology;  Laterality: Left;   ESOPHAGOGASTRODUODENOSCOPY     Had done dilatation done about 2 or 3 times before in [place]   ESOPHAGOGASTRODUODENOSCOPY (EGD) WITH PROPOFOL N/A 06/23/2019   Procedure: ESOPHAGOGASTRODUODENOSCOPY (EGD) WITH PROPOFOL;  Surgeon: Kathi Der, MD;  Location: [facility];  Service: Gastroenterology;  Laterality: N/A;   FALSE ANEURYSM REPAIR  03/13/2017   redo sternotomy, sterile abscess removal from Dacron graft, omental flap around aorta, CABG: LIMA-LAD ([name], Dr. Elroy Channel)   GREATER OMENTAL FLAP CLOSURE  2018   Of pericardium 2018   INSERTION OF MESH  10/14/2020   Procedure: INSERTION OF MESH;  Surgeon: Karie Soda, MD;  Location: [facility] OR;  Service: General;;   JOINT REPLACEMENT  knee   knee surgeries     13 surgeries on knee done before knee replacement    KNEE SURGERY  1983   LAPAROSCOPIC LYSIS OF ADHESIONS N/A 05/19/2021   Procedure: LYSIS OF ADHESIONS;  Surgeon: Karie Soda, MD;  Location: [facility] OR;  Service: General;  Laterality: N/A;  GEN & LOCAL   LYSIS OF ADHESION N/A 10/14/2020    Procedure: LYSIS OF ADHESION;  Surgeon: Karie Soda, MD;  Location: [facility] OR;  Service: General;  Laterality: N/A;   open heart surgery     03-13-2017   REPLACEMENT TOTAL KNEE Left 2015   ROTATOR CUFF REPAIR Right    done with biceps tendon repair   SPINE SURGERY  upper neck, lower spine   TONSILLECTOMY     removed as a child.   TOTAL KNEE ARTHROPLASTY Right 01/18/2022   Procedure: RIGHT TOTAL KNEE ARTHROPLASTY;  Surgeon: Tarry Kos, MD;  Location: [facility] OR;  Service: Orthopedics;  Laterality: Right;   VENTRAL HERNIA REPAIR N/A 10/14/2020   Procedure: LAPAROSCOPIC VENTRAL HERNIA REPAIR WITH TAP BLOCK BILATERAL;  Surgeon: Karie Soda, MD;  Location: [facility] OR;  Service: General;  Laterality: N/A;  VENTRAL HERNIA REPAIR N/A 05/19/2021   Procedure: LAPAROSCOPIC VENTRAL WALL HERNIA REPAIR;  Surgeon: Karie Soda, MD;  Location: [facility] OR;  Service: General;  Laterality: N/A;    Current Medications: Current Meds  Medication Sig   acetaminophen (TYLENOL) 325 MG tablet Take 650 mg by mouth every 6 (six) hours as needed for moderate pain.   amoxicillin (AMOXIL) 500 MG capsule Take 4 capsules by mouth 1 hour before dental appointment   ascorbic acid (VITAMIN C) 500 MG tablet Take 500 mg by mouth daily.   aspirin EC 81 MG tablet Take 81 mg by mouth daily. Swallow whole.   doxycycline (VIBRA-TABS) 100 MG tablet Take 1 tablet (100 mg total) by mouth 2 (two) times daily.   Eyelid Cleansers (THERATEARS STERILID CLEANSER) SOLN Apply a small amount to skin twice a day. Scrub eye lids with foam then rinse.   inclisiran (LEQVIO) 284 MG/1.5ML SOSY injection Inject 284 mg into the skin every 6 (six) months.   levETIRAcetam (KEPPRA) 1000 MG tablet Take 1 tablet (1,000 mg total) by mouth 2 (two) times daily.   lubiprostone (AMITIZA) 8 MCG capsule Take 1 capsule (8 mcg total) by mouth 2 (two) times daily with a meal. (Patient taking differently: Take 8 mcg by mouth daily.)   metoprolol succinate (TOPROL-XL) 25 MG  24 hr tablet Take 1/2 tablet (12.5 mg total) by mouth daily. With or immediately following a meal.   Multiple Vitamins-Minerals (MULTIVITAMIN WITH MINERALS) tablet Take 1 tablet by mouth daily.   pravastatin (PRAVACHOL) 40 MG tablet Take 1 tablet (40 mg total) by mouth every evening.   predniSONE (DELTASONE) 20 MG tablet Take 1 tablet (20 mg total) by mouth daily with breakfast.   RSV vaccine recomb adjuvanted (AREXVY) 120 MCG/0.5ML injection Inject into the muscle.   sildenafil (REVATIO) 20 MG tablet Take 2 -3 tablets by mouth once daily as needed   tamsulosin (FLOMAX) 0.4 MG CAPS capsule Take 2 capsules (0.8 mg total) by mouth daily.   tobramycin-dexamethasone (TOBRADEX) ophthalmic ointment Place 1 thin later into the left eye daily.     Allergies:   Oxycodone hcl, Zetia [ezetimibe], Augmentin [amoxicillin-pot clavulanate], Chlorhexidine, Elemental sulfur, Oxytocin, and Sulfur   Social History   Socioeconomic History   Marital status: Married    Spouse name: Not on file   Number of children: 3   Years of education: Not on file   Highest education level: Not on file  Occupational History   Occupation: RETIRED  Tobacco Use   Smoking status: Former    Types: Cigars    Quit date: 2021    Years since quitting: 3.4   Smokeless tobacco: Never   Tobacco comments:    Only smoked cigars for 6 months  Vaping Use   Vaping Use: Never used  Substance and Sexual Activity   Alcohol use: Yes    Alcohol/week: 1.0 standard drink of alcohol    Types: 1 Cans of beer per week    Comment: occassionally   Drug use: No   Sexual activity: Not on file  Other Topics Concern   Not on file  Social History Narrative   Lives at home with wife, is retired.  Education: 2 yrs college.   2 Children.   Social Determinants of Health   Financial Resource Strain: Low Risk  (12/20/2022)   Overall Financial Resource Strain (CARDIA)    Difficulty of Paying Living Expenses: Not hard at all  Food Insecurity: No  Food Insecurity (12/20/2022)   Hunger  Vital Sign    Worried About Programme researcher, broadcasting/film/video in the Last Year: Never true    Ran Out of Food in the Last Year: Never true  Transportation Needs: No Transportation Needs (12/20/2022)   PRAPARE - Administrator, Civil Service (Medical): No    Lack of Transportation (Non-Medical): No  Physical Activity: Sufficiently Active (12/20/2022)   Exercise Vital Sign    Days of Exercise per Week: 4 days    Minutes of Exercise per Session: 50 min  Stress: Stress Concern Present (12/20/2022)   [employer] of Occupational Health - Occupational Stress Questionnaire    Feeling of Stress : To some extent  Social Connections: Unknown (12/20/2022)   Social Connection and Isolation Panel [NHANES]    Frequency of Communication with Friends and Family: More than three times a week    Frequency of Social Gatherings with Friends and Family: More than three times a week    Attends Religious Services: Not on Marketing executive or Organizations: Yes    Attends Banker Meetings: 1 to 4 times per year    Marital Status: Married     Family History: The patient's family history includes Aneurysm in his mother; Arthritis in his father and mother; Ulcerative colitis in his mother and sister. There is no history of Colon cancer, Esophageal cancer, Colon polyps, Crohn's disease, Rectal cancer, or Stomach cancer.  ROS:   Please see the history of present illness.     All other systems reviewed and are negative.  EKGs/Labs/Other Studies Reviewed:    The following studies were reviewed today: Cardiac Studies & Procedures   CARDIAC CATHETERIZATION  CARDIAC CATHETERIZATION 12/18/2016     ECHOCARDIOGRAM  ECHOCARDIOGRAM COMPLETE 12/08/2020  Narrative ECHOCARDIOGRAM REPORT    Patient Name:   [facility] Date of Exam: 12/08/2020 Medical Rec #:  [id number]     Height:       70.0 in Accession #:    [id number]    Weight:       200.0 lb Date of  Birth:  12-Oct-1950      BSA:          2.087 m Patient Age:    72 years      BP:           130/80 mmHg Patient Gender: M             HR:           83 bpm. Exam Location:  [address]  Procedure: 2D Echo, 3D Echo, Cardiac Doppler, Color Doppler and Strain Analysis  Indications:     Z95.3 AVR  History:         Patient has prior history of Echocardiogram examinations, most recent 09/24/2017. Prior CABG, Stroke; Risk Factors:Dyslipidemia and Former Smoker. Thoracic aortic aneurysm s/p Bentall procedure 12/2015. Aortic Valve: 23 mm bovine valve is present in the aortic position. Procedure Date: 01/04/2016.  Sonographer:     Garald Braver, RDCS Referring Phys:  [id number] Jake Bathe Diagnosing Phys: Laurance Flatten MD  IMPRESSIONS   1. Left ventricular ejection fraction, by estimation, is 55 to 60%. Left ventricular ejection fraction by 3D volume is 60 %. The left ventricle has normal function. The left ventricle has no regional wall motion abnormalities. There is mild asymmetric left ventricular hypertrophy of the basal-septal segment. Left ventricular diastolic parameters are consistent with Grade I diastolic dysfunction (impaired relaxation). The average left ventricular global longitudinal  strain is -20.1 %. The global longitudinal strain is normal. 2. Right ventricular systolic function is mildly reduced. The right ventricular size is mildly enlarged. There is normal pulmonary artery systolic pressure. The estimated right ventricular systolic pressure is 34.8 mmHg. 3. Mild-to-moderate leaflet calcification of the mitral valve leaflet(s). 4. The mitral valve is normal in structure. Trivial mitral valve regurgitation. No evidence of mitral stenosis. 5. The aortic valve has been repaired/replaced. There is a 23 mm bovine valve present in the aortic position. Procedure Date: 01/04/2016. Echo findings are consistent with normal structure and function of the aortic valve prosthesis. Aortic  valve regurgitation is not visualized. No aortic valve stenosis. Mean gradient ; DI 0.64 6. Aortic root/ascending aorta has been repaired/replaced s/p bentall procedure in 12/2015. Ascending aorta is normal in size measuring 3.1cm. There is mild dilatation of the aortic root, measuring 38 mm. 7. The inferior vena cava is normal in size with greater than 50% respiratory variability, suggesting right atrial pressure of 3 mmHg.  Comparison(s): 09/24/17 EF 60-65%. PA pressure . AV mean PG, peak PG.  FINDINGS Left Ventricle: Left ventricular ejection fraction, by estimation, is 55 to 60%. Left ventricular ejection fraction by 3D volume is 60 %. The left ventricle has normal function. The left ventricle has no regional wall motion abnormalities. The average left ventricular global longitudinal strain is -20.1 %. The global longitudinal strain is normal. The left ventricular internal cavity size was normal in size. There is mild asymmetric left ventricular hypertrophy of the basal-septal segment. Abnormal (paradoxical) septal motion consistent with post-operative status. Left ventricular diastolic parameters are consistent with Grade I diastolic dysfunction (impaired relaxation).  Right Ventricle: The right ventricular size is mildly enlarged. No increase in right ventricular wall thickness. Right ventricular systolic function is mildly reduced. There is normal pulmonary artery systolic pressure. The tricuspid regurgitant velocity is 2.82 m/s, and with an assumed right atrial pressure of 3 mmHg, the estimated right ventricular systolic pressure is 34.8 mmHg.  Left Atrium: Left atrial size was normal in size.  Right Atrium: Right atrial size was normal in size.  Pericardium: There is no evidence of pericardial effusion.  Mitral Valve: The mitral valve is normal in structure. There is mild thickening of the mitral valve leaflet(s). There is mild-to-moderate leaflet calcification of  the mitral valve leaflet(s). Mild mitral annular calcification. Trivial mitral valve regurgitation. No evidence of mitral valve stenosis.  Tricuspid Valve: The tricuspid valve is normal in structure. Tricuspid valve regurgitation is trivial.  Aortic Valve: The aortic valve has been repaired/replaced. Aortic valve regurgitation is not visualized. No aortic stenosis is present. Aortic valve mean gradient measures 6.0 mmHg. Aortic valve peak gradient measures 10.1 mmHg. There is a 23 mm bovine valve present in the aortic position. Procedure Date: 01/04/2016. Echo findings are consistent with normal structure and function of the aortic valve prosthesis.  Pulmonic Valve: The pulmonic valve was normal in structure. Pulmonic valve regurgitation is not visualized.  Aorta: The aortic root/ascending aorta has been repaired/replaced, aortic dilatation noted and S/p bentall procedure in 12/2015. Ascending aorta is normal in size measuring 3.1cm. There is mild dilatation of the aortic root, measuring 38 mm.  Venous: The inferior vena cava is normal in size with greater than 50% respiratory variability, suggesting right atrial pressure of 3 mmHg.  IAS/Shunts: No atrial level shunt detected by color flow Doppler.   LEFT VENTRICLE PLAX 2D LVIDd:         4.30 cm  Diastology LVIDs:         3.00 cm         LV e' medial:    5.87 cm/s LV PW:         0.80 cm         LV E/e' medial:  20.1 LV IVS:        0.90 cm         LV e' lateral:   9.43 cm/s LV E/e' lateral: 12.5  2D Longitudinal Strain 2D Strain GLS  -17.6 % (A2C): 2D Strain GLS  -23.2 % (A3C): 2D Strain GLS  -19.5 % (A4C): 2D Strain GLS  -20.1 % Avg:  3D Volume EF LV 3D EF:    Left ventricular ejection fraction by 3D volume is 60 %.  3D Volume EF: 3D EF:        60 % LV EDV:       116 ml LV ESV:       46 ml LV SV:        70 ml  RIGHT VENTRICLE RV Basal diam:  3.90 cm RV S prime:     6.47 cm/s TAPSE (M-mode): 1.5 cm RVSP:            34.8 mmHg  LEFT ATRIUM             Index       RIGHT ATRIUM           Index LA diam:        4.00 cm 1.92 cm/m  RA Pressure: 3.00 mmHg LA Vol (A2C):   39.8 ml 19.07 ml/m RA Area:     16.30 cm LA Vol (A4C):   31.4 ml 15.04 ml/m RA Volume:   39.50 ml  18.92 ml/m LA Biplane Vol: 36.4 ml 17.44 ml/m AORTIC VALVE AV Vmax:           159.00 cm/s AV Vmean:          110.000 cm/s AV VTI:            0.332 m AV Peak Grad:      10.1 mmHg AV Mean Grad:      6.0 mmHg LVOT Vmax:         102.00 cm/s LVOT Vmean:        69.700 cm/s LVOT VTI:          0.208 m LVOT/AV VTI ratio: 0.63  AORTA Ao Root diam: 3.80 cm Ao Asc diam:  3.10 cm  MITRAL VALVE                TRICUSPID VALVE TR Peak grad:   31.8 mmHg TR Vmax:        282.00 cm/s MV E velocity: 118.00 cm/s  Estimated RAP:  3.00 mmHg MV A velocity: 108.00 cm/s  RVSP:           34.8 mmHg MV E/A ratio:  1.09 SHUNTS Systemic VTI: 0.21 m  Laurance Flatten MD Electronically signed by Laurance Flatten MD Signature Date/Time: 12/08/2020/11:21:47 AM    Final (Updated)              EKG:   01/15/2023: NSR 85 RBBB 01/04/2022: Sinus rhythm. RBBB, PAC. 10/06/2020-sinus rhythm right bundle branch block  Recent Labs: 05/07/2022: ALT 16 06/23/2022: BUN 13; Creatinine, Ser 0.89; Hemoglobin 14.3; Platelets 305; Potassium 4.1; Sodium 132  Recent Lipid Panel    Component Value Date/Time   CHOL 116 12/03/2022 0809   TRIG 123 12/03/2022 0809  HDL 40 12/03/2022 0809   CHOLHDL 2.9 12/03/2022 0809   LDLCALC 54 12/03/2022 0809     Risk Assessment/Calculations:               Physical Exam:    VS:  BP 100/70   Pulse 84   Ht 5\' 10"  (1.778 m)   Wt 178 lb (80.7 kg)   SpO2 95%   BMI 25.54 kg/m     Wt Readings from Last 3 Encounters:  01/15/23 178 lb (80.7 kg)  12/11/22 181 lb 9.6 oz (82.4 kg)  10/22/22 175 lb 14.8 oz (79.8 kg)     GEN:  Well nourished, well developed in no acute distress HEENT: Normal NECK: No JVD; No carotid  bruits LYMPHATICS: No lymphadenopathy CARDIAC: Scar, RRR, no murmurs, rubs, gallops RESPIRATORY:  Clear to auscultation without rales, wheezing or rhonchi  ABDOMEN: Soft, non-tender, non-distended MUSCULOSKELETAL:  No edema; No deformity  SKIN: Warm and dry NEUROLOGIC:  Alert and oriented x 3 PSYCHIATRIC:  Normal affect   ASSESSMENT:    1. Coronary artery disease involving native coronary artery of native heart without angina pectoris   2. Mixed hyperlipidemia   3. Hx of aortic aneurysm repair   4. Statin intolerance    PLAN:    In order of problems listed above:  Thoracic aortic aneurysm without rupture (HCC), bioprosthetic aortic valve Bentall procedure replacement of one of the 2 vein grafts with a mammary to LAD. Post repair [facility], Dr. Maren Beach monitoring closely.  Recent CT reviewed as above.  Dr. Lorrin Mais note reviewed as well.  Continuing to monitor.   Coronary artery disease involving native coronary artery of native heart without angina pectoris Bypass.  Previously in 2018 sterile abscess was removed.  Currently having no anginal symptoms.  Aggressive risk factor prevention.   Hx of aortic aneurysm repair Bentall procedure.  As described above.   Mixed hyperlipidemia with statin intolerance Currently on pravastatin 40.  Maximally tolerated statin.  Piror trouble with Zetia.  Inclisiran Q 6 months. Lipid clinic.  LDL goal 55 given his prior CABG. LDL 54 on 12/03/22.   blood pressure today.  Excellent, asymptomatic          Medication Adjustments/Labs and Tests Ordered: Current medicines are reviewed at length with the patient today.  Concerns regarding medicines are outlined above.  Orders Placed This Encounter  Procedures   EKG 12-Lead   No orders of the defined types were placed in this encounter.   Patient Instructions  Medication Instructions:  The current medical regimen is effective;  continue present plan and medications.  *If you need a refill on  your cardiac medications before your next appointment, please call your pharmacy*  Follow-Up: At [facility], you and your health needs are our priority.  As part of our continuing mission to provide you with exceptional heart care, we have created designated Provider Care Teams.  These Care Teams include your primary Cardiologist (physician) and Advanced Practice Providers (APPs -  Physician Assistants and Nurse Practitioners) who all work together to provide you with the care you need, when you need it.  We recommend signing up for the patient portal called "MyChart".  Sign up information is provided on this After Visit Summary.  MyChart is used to connect with patients for Virtual Visits (Telemedicine).  Patients are able to view lab/test results, encounter notes, upcoming appointments, etc.  Non-urgent messages can be sent to your provider as well.   To learn more about what  you can do with MyChart, go to [email or web address].    Your next appointment:   1 year(s)  Provider:   Dr Donato Schultz        Signed, Donato Schultz, MD  01/15/2023 8:56 AM    [facility]

## 2023-01-31 ENCOUNTER — Ambulatory Visit: Payer: Medicare HMO | Admitting: Neurology

## 2023-02-05 ENCOUNTER — Other Ambulatory Visit: Payer: Self-pay

## 2023-02-05 ENCOUNTER — Other Ambulatory Visit: Payer: Self-pay | Admitting: Cardiology

## 2023-02-05 ENCOUNTER — Other Ambulatory Visit (HOSPITAL_BASED_OUTPATIENT_CLINIC_OR_DEPARTMENT_OTHER): Payer: Self-pay

## 2023-02-05 MED ORDER — METOPROLOL SUCCINATE ER 25 MG PO TB24
12.5000 mg | ORAL_TABLET | Freq: Every day | ORAL | 3 refills | Status: DC
  Filled 2023-02-05: qty 45, 90d supply, fill #0
  Filled 2023-05-02: qty 45, 90d supply, fill #1
  Filled 2023-07-31: qty 45, 90d supply, fill #2
  Filled 2023-10-29: qty 45, 90d supply, fill #3

## 2023-02-25 ENCOUNTER — Other Ambulatory Visit: Payer: Self-pay | Admitting: Cardiothoracic Surgery

## 2023-02-25 DIAGNOSIS — I712 Thoracic aortic aneurysm, without rupture, unspecified: Secondary | ICD-10-CM

## 2023-02-26 ENCOUNTER — Other Ambulatory Visit (HOSPITAL_BASED_OUTPATIENT_CLINIC_OR_DEPARTMENT_OTHER): Payer: Self-pay

## 2023-03-14 ENCOUNTER — Other Ambulatory Visit (HOSPITAL_BASED_OUTPATIENT_CLINIC_OR_DEPARTMENT_OTHER): Payer: Self-pay

## 2023-03-25 ENCOUNTER — Encounter: Payer: Self-pay | Admitting: Cardiology

## 2023-03-25 ENCOUNTER — Other Ambulatory Visit (HOSPITAL_BASED_OUTPATIENT_CLINIC_OR_DEPARTMENT_OTHER): Payer: Self-pay

## 2023-04-01 ENCOUNTER — Encounter: Payer: Self-pay | Admitting: Cardiothoracic Surgery

## 2023-04-01 ENCOUNTER — Ambulatory Visit
Admission: RE | Admit: 2023-04-01 | Discharge: 2023-04-01 | Disposition: A | Discharge status: Home / Self Care | Payer: Medicare HMO | Source: Ambulatory Visit | Attending: Cardiothoracic Surgery | Admitting: Cardiothoracic Surgery

## 2023-04-01 ENCOUNTER — Ambulatory Visit: Payer: Medicare HMO | Admitting: Cardiothoracic Surgery

## 2023-04-01 VITALS — BP 109/57 | HR 80 | Resp 20 | Ht 70.0 in | Wt 179.0 lb

## 2023-04-01 DIAGNOSIS — I719 Aortic aneurysm of unspecified site, without rupture: Secondary | ICD-10-CM

## 2023-04-01 DIAGNOSIS — M314 Aortic arch syndrome [Takayasu]: Secondary | ICD-10-CM

## 2023-04-01 DIAGNOSIS — I7121 Aneurysm of the ascending aorta, without rupture: Secondary | ICD-10-CM

## 2023-04-01 DIAGNOSIS — I712 Thoracic aortic aneurysm, without rupture, unspecified: Secondary | ICD-10-CM

## 2023-04-01 MED ORDER — IOPAMIDOL (ISOVUE-300) INJECTION 61%
100.0000 mL | Freq: Once | INTRAVENOUS | Status: AC | PRN
  Administered 2023-04-01: 75 mL via INTRAVENOUS

## 2023-04-01 NOTE — Progress Notes (Signed)
HPI:HPI: Patient returns for scheduled annual visit with CTA.  Patient is status post Bentall procedure several years ago in [place] and subsequent reexploration at [facility] in 2018 for possible pseudoaneurysm and replacement of one of the 2 vein grafts with a mammary to the LAD.  There is no pseudoaneurysm but there was a fluid collection and this was drained and a omental flap was placed.   The patient is now followed annually with a CTA.  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  Today it is slightly enlarged, probably 12 mm and remains hemodynamically insignificant at low risk.  Patient does complain of some intermittent chest tightness which lasts about 5 minutes.  It is not related to activity or meals and sometimes wakes him up at night but then resolves.  His cardiologist is Dr. Donato Schultz and I will relay this information.   Current Outpatient Medications  Medication Sig Dispense Refill   acetaminophen (TYLENOL) 325 MG tablet Take 650 mg by mouth every 6 (six) hours as needed for moderate pain.     amoxicillin (AMOXIL) 500 MG capsule Take 4 capsules by mouth 1 hour before dental appointment 16 capsule 0   ascorbic acid (VITAMIN C) 500 MG tablet Take 500 mg by mouth daily.     aspirin EC 81 MG tablet Take 81 mg by mouth daily. Swallow whole.     inclisiran (LEQVIO) 284 MG/1.5ML SOSY injection Inject 284 mg into the skin every 6 (six) months. 1.5 mL 0   levETIRAcetam (KEPPRA) 1000 MG tablet Take 1 tablet (1,000 mg total) by mouth 2 (two) times daily. 180 tablet 4   lubiprostone (AMITIZA) 8 MCG capsule Take 1 capsule (8 mcg total) by mouth 2 (two) times daily with a meal. (Patient taking differently: Take 8 mcg by mouth daily.) 180 capsule 3   metoprolol succinate (TOPROL-XL) 25 MG 24 hr tablet Take 1/2 tablet (12.5 mg total) by mouth daily.  With or immediately following a meal. 45 tablet 3   Multiple Vitamins-Minerals (MULTIVITAMIN WITH MINERALS) tablet Take 1 tablet by mouth daily.     pravastatin (PRAVACHOL) 40 MG tablet Take 1 tablet (40 mg total) by mouth every evening. 90 tablet 1   RSV vaccine recomb adjuvanted (AREXVY) 120 MCG/0.5ML injection Inject into the muscle. 0.5 mL 0   sildenafil (REVATIO) 20 MG tablet Take 2 -3 tablets by mouth once daily as needed 30 tablet 1   tamsulosin (FLOMAX) 0.4 MG CAPS capsule Take 2 capsules (0.8 mg total) by mouth daily. 180 capsule 1   tobramycin-dexamethasone (TOBRADEX) ophthalmic ointment Place 1 thin later into the left eye daily. 3.5 g 0   No current facility-administered medications for this visit.     Physical Exam: Blood pressure (!) 109/57, pulse 80, resp. rate 20, height 5\' 10"  (1.778 m), weight 179 lb (81.2 kg), SpO2 92%.        Exam    General- alert and comfortable    Neck- no JVD, no cervical adenopathy palpable, no carotid bruit   Lungs- clear without rales, wheezes   Cor- regular rate and rhythm, no murmur , gallop   Abdomen- soft, non-tender   Extremities - warm, non-tender, minimal edema   Neuro- oriented, appropriate, no focal weakness  Diagnostic Tests: CTA images personally reviewed and the images also demonstrated to the patient.  There is  a very slight pseudoaneurysm at the proximal aortic suture line which today measures about 12 mm which does not pose a hemodynamic risk but needs to be followed up as we are doing.  His blood pressure is very well-controlled and he does not smoke.  Impression: History of ascending aortic replacement with AVR and subsequent reexploration at [facility] with revision of a saphenous vein graft to left IMA to LAD.  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  Plan: Return in 1 year for CTA.   Lovett Sox, MD [facility] [phone]

## 2023-04-02 ENCOUNTER — Telehealth: Payer: Self-pay | Admitting: *Deleted

## 2023-04-02 DIAGNOSIS — R072 Precordial pain: Secondary | ICD-10-CM

## 2023-04-02 NOTE — Telephone Encounter (Signed)
With his prior CABG, chest pain symptoms, Pam, let's set him up with Cardiac Stress PET scan. Thanks Donato Schultz, MD         Order placed for testing.  Pt will be contacted to be scheduled.  Instructions to be sent to pt via MyChart    How to Prepare for Your Cardiac PET/CT Stress Test:  1. Please do not take these medications before your test:   Medications that may interfere with the cardiac pharmacological stress agent (ex. nitrates - including erectile dysfunction medications, isosorbide mononitrate, tamulosin or beta-blockers) the day of the exam. (Erectile dysfunction medication should be held for at least 72 hrs prior to test) Theophylline containing medications for 12 hours. Dipyridamole 48 hours prior to the test. Your remaining medications may be taken with water.  2. Nothing to eat or drink, except water, 3 hours prior to arrival time.   NO caffeine/decaffeinated products, or chocolate 12 hours prior to arrival.  3. NO perfume, cologne or lotion  4. Total time is 1 to 2 hours; you may want to bring reading material for the waiting time.  5. Please report to Radiology at the [facility] Main Entrance 30 minutes early for your test.  [address] [place], [place] [address]  6. Please report to Radiology at [facility] Main Entrance, medical mall, 30 mins prior to your test.  [address]  [place], [place]  [phone]  Diabetic Preparation:  Hold oral medications. You may take NPH and Lantus insulin. Do not take Humalog or Humulin R (Regular Insulin) the day of your test. Check blood sugars prior to leaving the house. If able to eat breakfast prior to 3 hour fasting, you may take all medications, including your insulin, Do not worry if you miss your breakfast dose of insulin - start at your next meal.  IF YOU THINK YOU MAY BE PREGNANT, OR ARE NURSING PLEASE INFORM THE TECHNOLOGIST.  In preparation for your appointment,  medication and supplies will be purchased.  Appointment availability is limited, so if you need to cancel or reschedule, please call the Radiology Department at [phone] [facility]) OR [phone] [facility])  24 hours in advance to avoid a cancellation fee of $100.00  What to Expect After you Arrive:  Once you arrive and check in for your appointment, you will be taken to a preparation room within the Radiology Department.  A technologist or Nurse will obtain your medical history, verify that you are correctly prepped for the exam, and explain the procedure.  Afterwards,  an IV will be started in your arm and electrodes will be placed on your skin for EKG monitoring during the stress portion of the exam. Then you will be escorted to the PET/CT scanner.  There, staff will get you positioned on the scanner and obtain a blood pressure and EKG.  During the exam, you will continue to be connected to the EKG and blood pressure machines.  A small, safe amount of a radioactive tracer will be injected in your IV to obtain a series of pictures of your heart along with an injection of a stress agent.    After your Exam:  It is recommended that you eat a meal and drink a caffeinated beverage to counter act any effects of the stress agent.  Drink plenty of fluids for the remainder of the day and urinate frequently for the first couple of hours after the exam.  Your doctor will inform you  of your test results within 7-10 business days.  For more information and frequently asked questions, please visit our website : [email or web address]  For questions about your test or how to prepare for your test, please call: Cardiac Imaging Nurse Navigators Office: [phone]

## 2023-04-03 NOTE — Telephone Encounter (Signed)
Attempted to contact pt to review orders and instructions for PET scan.  Advised I will send instructions to him via MyChart.  Requested he call back if any questions or concerns.

## 2023-04-05 NOTE — Telephone Encounter (Signed)
Pt has been scheduled for 04/16/23.

## 2023-04-08 ENCOUNTER — Other Ambulatory Visit (HOSPITAL_BASED_OUTPATIENT_CLINIC_OR_DEPARTMENT_OTHER): Payer: Self-pay

## 2023-04-08 ENCOUNTER — Encounter: Payer: Self-pay | Admitting: Cardiology

## 2023-04-15 ENCOUNTER — Telehealth (HOSPITAL_COMMUNITY): Payer: Self-pay | Admitting: *Deleted

## 2023-04-15 MED ORDER — REGADENOSON 0.4 MG/5ML IV SOLN
0.4000 mg | Freq: Once | INTRAVENOUS | Status: AC
  Administered 2023-04-16: 0.4 mg via INTRAVENOUS

## 2023-04-15 NOTE — Telephone Encounter (Signed)
Reaching out to patient to offer assistance regarding upcoming cardiac imaging study; pt verbalizes understanding of appt date/time, parking situation and where to check in, pre-test NPO status and verified current allergies; name and call back number provided for further questions should they arise  Larey Brick RN Navigator Cardiac Imaging [facility] [phone] office [phone] cell  Patient aware to hold his flomax, sildenafil, and caffeine prior to his cardiac PET scan.

## 2023-04-16 ENCOUNTER — Encounter (HOSPITAL_COMMUNITY)
Admission: RE | Admit: 2023-04-16 | Discharge: 2023-04-16 | Disposition: A | Discharge status: Home / Self Care | Payer: Medicare HMO | Source: Ambulatory Visit | Attending: Cardiology | Admitting: Cardiology

## 2023-04-16 DIAGNOSIS — R072 Precordial pain: Secondary | ICD-10-CM | POA: Insufficient documentation

## 2023-04-16 LAB — NM PET CT CARDIAC PERFUSION MULTI W/ABSOLUTE BLOODFLOW
LV dias vol: 59 mL (ref 62–150)
LV sys vol: 11 mL
MBFR: 2.59
Rest MBF: 0.93 ml/g/min
Rest Nuclear Isotope Dose: 21.1 mCi
ST Depression (mm): 0 mm
Stress MBF: 2.41 ml/g/min
Stress Nuclear Isotope Dose: 21.1 mCi
TID: 0.97

## 2023-04-16 MED ORDER — RUBIDIUM RB82 GENERATOR (RUBYFILL)
21.0900 | PACK | Freq: Once | INTRAVENOUS | Status: AC
  Administered 2023-04-16: 21.09 via INTRAVENOUS

## 2023-04-16 MED ORDER — RUBIDIUM RB82 GENERATOR (RUBYFILL)
21.1300 | PACK | Freq: Once | INTRAVENOUS | Status: AC
  Administered 2023-04-16: 21.13 via INTRAVENOUS

## 2023-04-16 MED ORDER — REGADENOSON 0.4 MG/5ML IV SOLN
INTRAVENOUS | Status: AC
  Filled 2023-04-16: qty 5

## 2023-05-02 ENCOUNTER — Other Ambulatory Visit (HOSPITAL_BASED_OUTPATIENT_CLINIC_OR_DEPARTMENT_OTHER): Payer: Self-pay

## 2023-05-02 MED ORDER — TAMSULOSIN HCL 0.4 MG PO CAPS
0.8000 mg | ORAL_CAPSULE | Freq: Every day | ORAL | 1 refills | Status: DC
  Filled 2023-05-02: qty 180, 90d supply, fill #0
  Filled 2023-08-13: qty 180, 90d supply, fill #1

## 2023-05-03 ENCOUNTER — Other Ambulatory Visit: Payer: Self-pay

## 2023-05-08 ENCOUNTER — Other Ambulatory Visit (HOSPITAL_BASED_OUTPATIENT_CLINIC_OR_DEPARTMENT_OTHER): Payer: Self-pay

## 2023-05-08 MED ORDER — COMIRNATY 30 MCG/0.3ML IM SUSY
0.3000 mL | PREFILLED_SYRINGE | Freq: Once | INTRAMUSCULAR | 0 refills | Status: AC
  Filled 2023-05-08: qty 0.3, 1d supply, fill #0

## 2023-05-08 MED ORDER — INFLUENZA VAC A&B SURF ANT ADJ 0.5 ML IM SUSY
0.5000 mL | PREFILLED_SYRINGE | Freq: Once | INTRAMUSCULAR | 0 refills | Status: AC
  Filled 2023-05-08: qty 0.5, 1d supply, fill #0

## 2023-06-10 ENCOUNTER — Other Ambulatory Visit: Payer: Self-pay | Admitting: Cardiology

## 2023-06-12 ENCOUNTER — Other Ambulatory Visit (HOSPITAL_BASED_OUTPATIENT_CLINIC_OR_DEPARTMENT_OTHER): Payer: Self-pay

## 2023-06-12 ENCOUNTER — Ambulatory Visit: Payer: Medicare HMO

## 2023-06-12 VITALS — BP 108/70 | HR 87 | Temp 97.5°F | Resp 16 | Ht 70.0 in | Wt 179.2 lb

## 2023-06-12 DIAGNOSIS — E782 Mixed hyperlipidemia: Secondary | ICD-10-CM | POA: Diagnosis not present

## 2023-06-12 DIAGNOSIS — I639 Cerebral infarction, unspecified: Secondary | ICD-10-CM | POA: Diagnosis not present

## 2023-06-12 DIAGNOSIS — I2510 Atherosclerotic heart disease of native coronary artery without angina pectoris: Secondary | ICD-10-CM

## 2023-06-12 MED ORDER — INCLISIRAN SODIUM 284 MG/1.5ML ~~LOC~~ SOSY
284.0000 mg | PREFILLED_SYRINGE | Freq: Once | SUBCUTANEOUS | Status: AC
  Administered 2023-06-12: 284 mg via SUBCUTANEOUS
  Filled 2023-06-12: qty 1.5

## 2023-06-12 MED ORDER — PRAVASTATIN SODIUM 40 MG PO TABS
40.0000 mg | ORAL_TABLET | Freq: Every evening | ORAL | 1 refills | Status: DC
  Filled 2023-06-12 – 2023-07-06 (×2): qty 90, 90d supply, fill #0
  Filled 2023-09-21: qty 90, 90d supply, fill #1

## 2023-06-12 NOTE — Progress Notes (Signed)
Diagnosis: Hyperlipidemia  Provider:  Chilton Greathouse MD  Procedure: Injection  Leqvio (inclisiran), Dose: 284 mg, Site: subcutaneous, Number of injections: 1  Administered in left arm.  Post Care: Patient declined observation  Discharge: Condition: Stable, Destination: Home . AVS Provided  Performed by:  Wyvonne Lenz, RN

## 2023-06-21 IMAGING — CT CT ANGIO CHEST
4 of 8 series · 15 of 36 positions shown · IV contrast (agent unspecified)
Comparison: Prior CT 03/09/2020

CLINICAL DATA: Follow-up aortic aneurysm. History of Bentall
procedure.

Creatinine was obtained on site at [HOSPITAL] at [REDACTED].
Results: Creatinine 1.2 mg/dL.
EXAM:
CT ANGIOGRAPHY CHEST WITH CONTRAST
TECHNIQUE: Multidetector CT imaging of the chest was performed using the
standard protocol during bolus administration of intravenous
contrast. Multiplanar CT image reconstructions and MIPs were
obtained to evaluate the vascular anatomy.

[Series 5: chest w/o 2.00 br40 s3 axial · axial · non-contrast · 0.70mm/px · z∈[+1525,+1765]mm · 5 of 182 slices shown]
[im 31/182  lung]
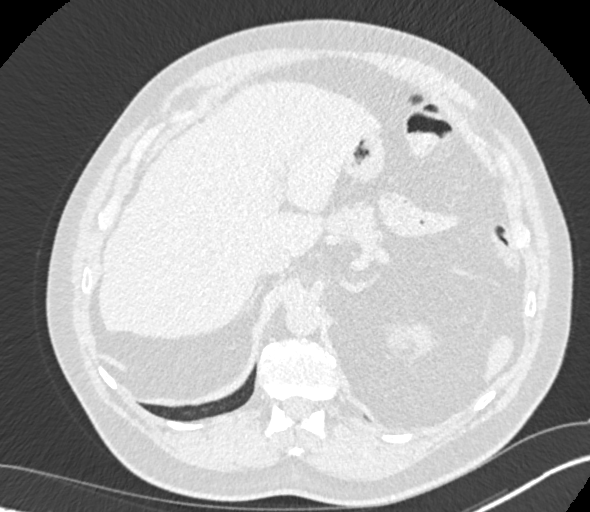
[im 61/182  mediastinal]
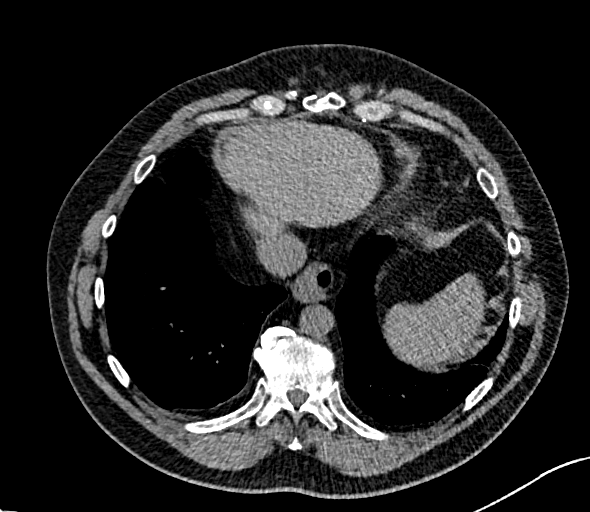
[im 91/182  lung]
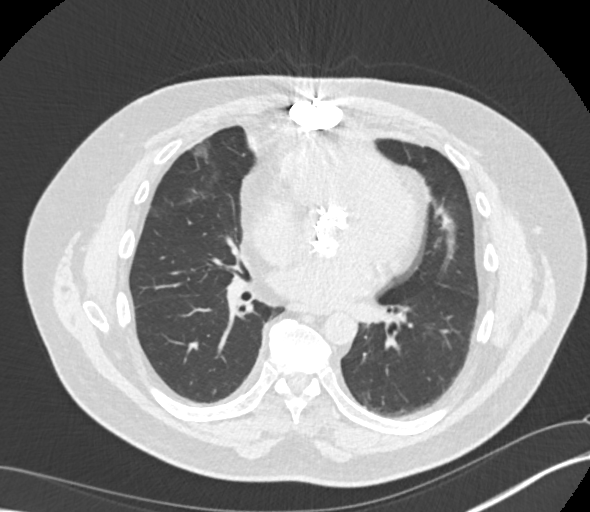
[im 121/182  mediastinal]
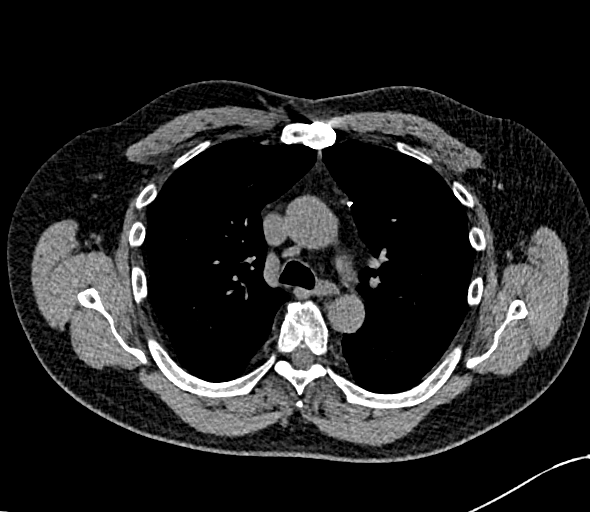
[im 151/182  lung]
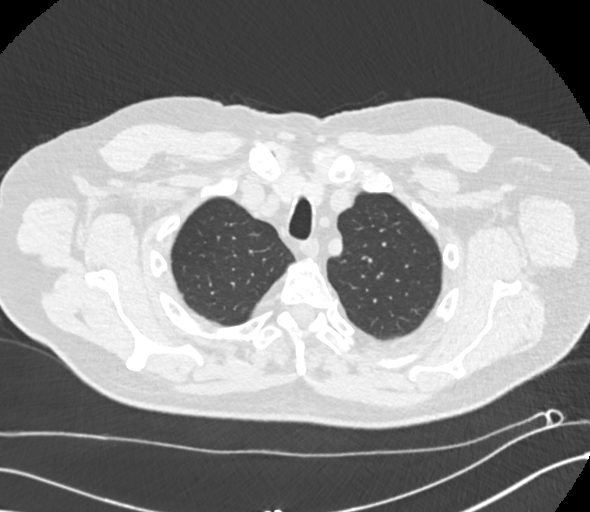

[Series 7: chest w/o 2.00 br60 s3 axial · axial · non-contrast · 0.70mm/px · z∈[+1525,+1765]mm · 5 of 182 slices shown]
[im 31/182  lung]
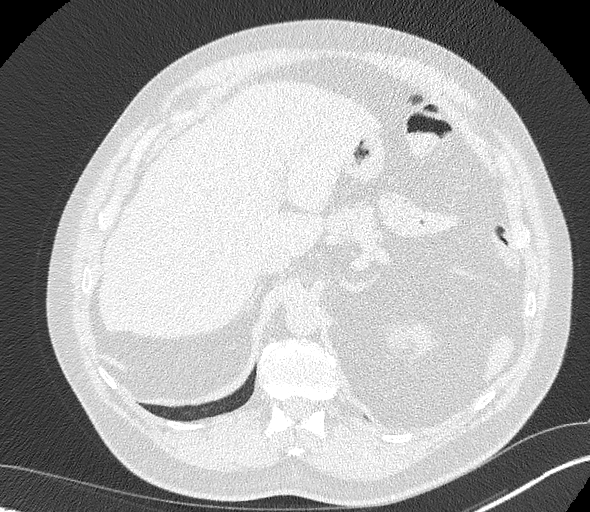
[im 61/182  lung]
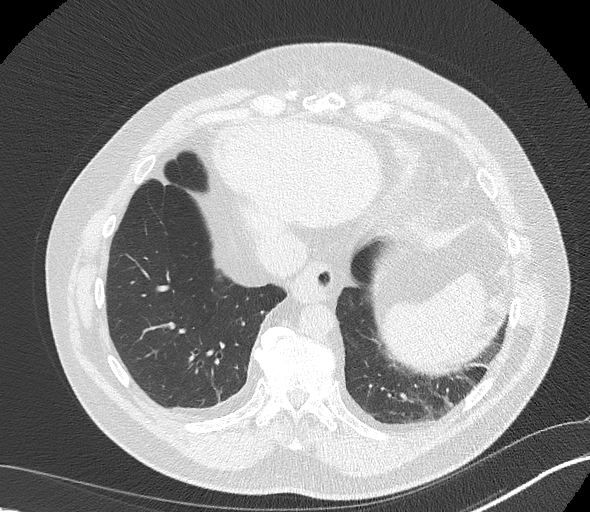
[im 91/182  lung]
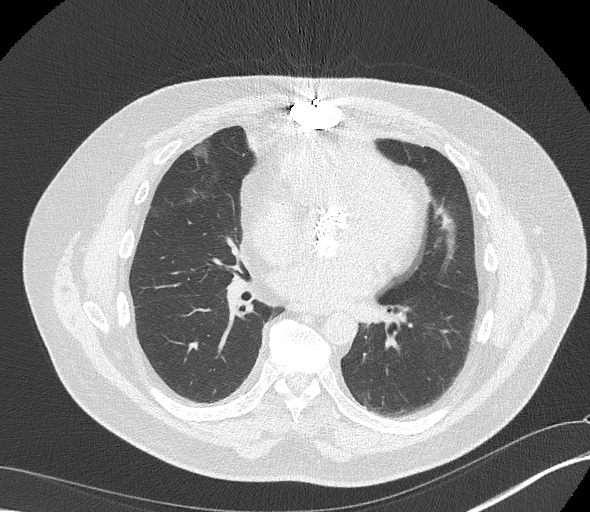
[im 121/182  lung]
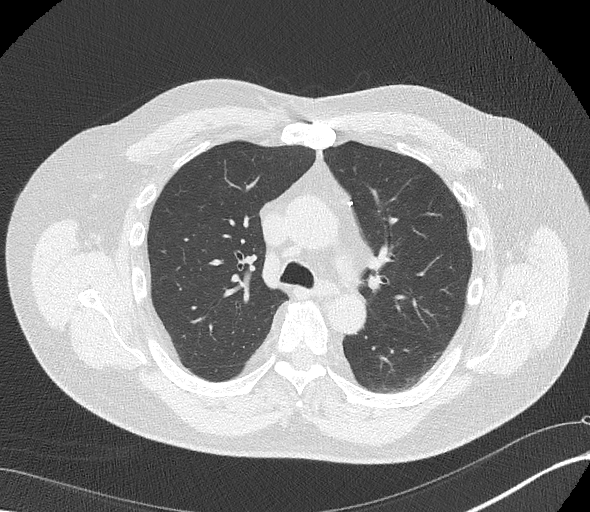
[im 151/182  lung]
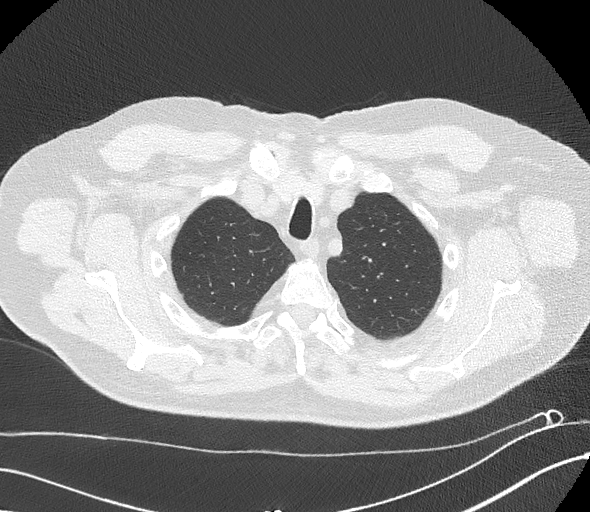

[Series 9: cta thorax 2.00 bv36 s3 axial arterial · axial · arterial · 0.65mm/px · z∈[+1522,+1696]mm · 4 of 174 slices shown]
[im 29/174  lung]
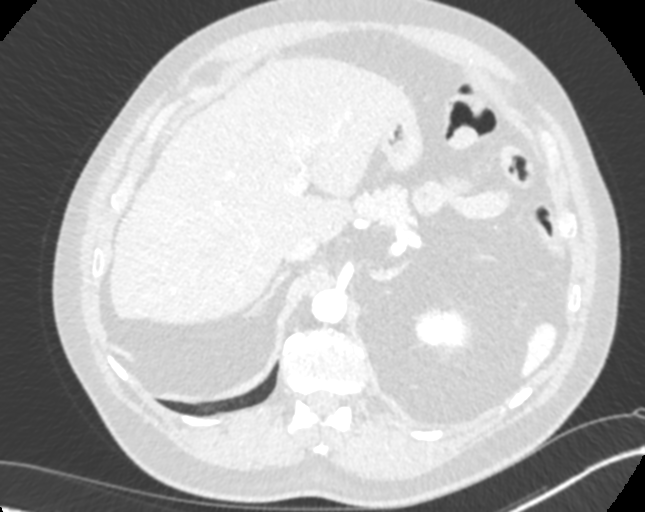
[im 58/174  lung]
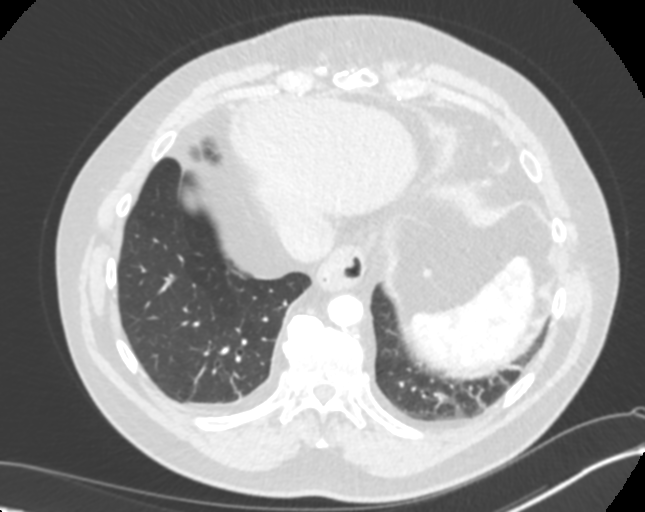
[im 87/174  lung]
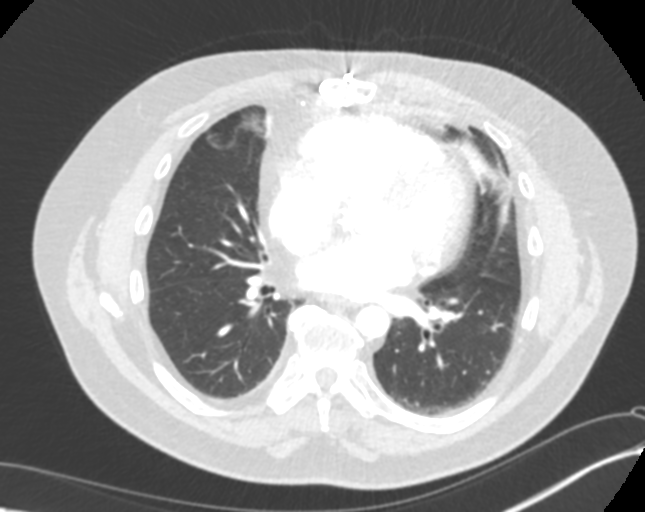
[im 116/174  lung]
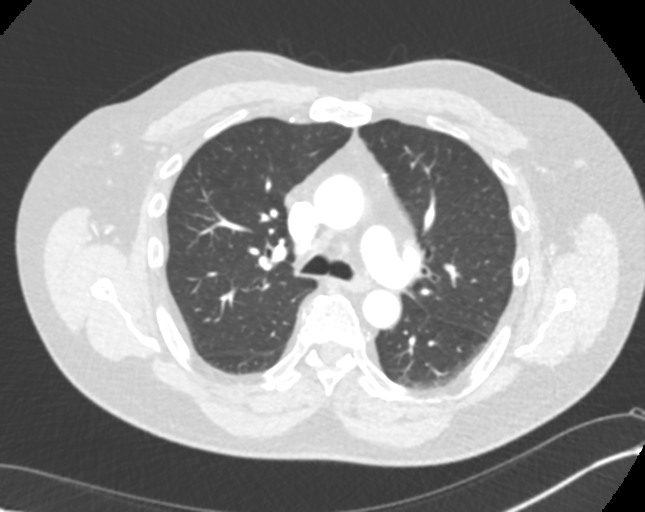

[Series 14: cta thorax 2.00 bv36 s3 cor st · coronal · 0.68mm/px · 1 of 174 slices shown]
[im 87/174  mediastinal]
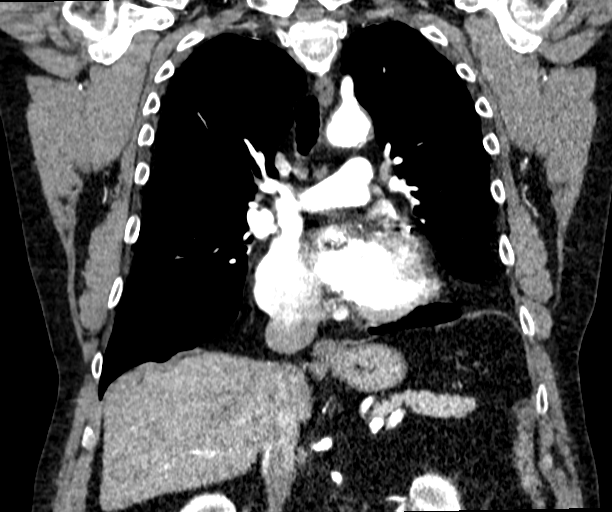

[15 of 36 positions shown; findings below may reference images not displayed]

RADIATION DOSE REDUCTION: This exam was performed according to the
departmental dose-optimization program which includes automated
exposure control, adjustment of the mA and/or kV according to
patient size and/or use of iterative reconstruction technique.

CONTRAST:  75mL 044PHR-8O3 IOPAMIDOL (044PHR-8O3) INJECTION 76%
FINDINGS: Cardiovascular: Status post median sternotomy with ascending aortic
grafting, aortic valve replacement and CABG. The diameter of the
ascending aorta at the sinotubular junction is 3.0 cm on coronal
image 75/14, stable. The aortic arch and descending aorta are normal
in caliber. There is no aortic dissection. However, there is a small
pseudoaneurysm of the aortic root anteriorly, just inferior to the
origin of a patent left coronary artery bypass graft. This is most
obvious on coronal images 66 and 67 of series 174 and measures up to
10 mm in diameter. This has enlarged from the previous study. The
heart size is normal. There is no pericardial effusion.

Mediastinum/Nodes: There are no enlarged mediastinal, hilar or
axillary lymph nodes. The thyroid gland, trachea and esophagus
demonstrate no significant findings.

Lungs/Pleura: No pleural effusion or pneumothorax. Mild
centrilobular emphysema with chronic bibasilar scarring. No
suspicious pulmonary nodule.

Upper abdomen: The visualized upper abdomen appears stable without
significant findings. There are stable low-density renal lesions
bilaterally which are compatible with cysts.

Musculoskeletal/Chest wall: There is no chest wall mass or
suspicious osseous finding.

Review of the MIP images confirms the above findings.
IMPRESSION: 1. Enlarging small pseudoaneurysm of the ascending aorta anteriorly
just distal to the orifice of a patent left coronary artery bypass
graft.
2. Otherwise stable appearance of the aorta status post AVR and
aortic grafting. No acute vascular findings.
3. Stable mild scarring at both lung bases.
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
## 2023-07-02 ENCOUNTER — Encounter: Payer: Self-pay | Admitting: Neurology

## 2023-07-02 ENCOUNTER — Ambulatory Visit (INDEPENDENT_AMBULATORY_CARE_PROVIDER_SITE_OTHER): Payer: Medicare HMO | Admitting: Neurology

## 2023-07-02 ENCOUNTER — Other Ambulatory Visit (HOSPITAL_BASED_OUTPATIENT_CLINIC_OR_DEPARTMENT_OTHER): Payer: Self-pay

## 2023-07-02 VITALS — BP 123/72 | HR 84 | Ht 70.0 in | Wt 180.0 lb

## 2023-07-02 DIAGNOSIS — G250 Essential tremor: Secondary | ICD-10-CM

## 2023-07-02 DIAGNOSIS — I639 Cerebral infarction, unspecified: Secondary | ICD-10-CM

## 2023-07-02 DIAGNOSIS — R569 Unspecified convulsions: Secondary | ICD-10-CM

## 2023-07-02 MED ORDER — LEVETIRACETAM 1000 MG PO TABS
1000.0000 mg | ORAL_TABLET | Freq: Two times a day (BID) | ORAL | 4 refills | Status: DC
  Filled 2023-07-02: qty 180, 90d supply, fill #0
  Filled 2023-10-04: qty 180, 90d supply, fill #1

## 2023-07-02 NOTE — Patient Instructions (Signed)
Would recommend increase in Keppra dosing, please call if seizures continue. Please do not drive until seizure free for 6 months. Check Keppra level today.

## 2023-07-02 NOTE — Progress Notes (Signed)
Chart reviewed, agree above plan ?

## 2023-07-03 LAB — LEVETIRACETAM LEVEL: Levetiracetam Lvl: 37.4 ug/mL (ref 10.0–40.0)

## 2023-07-04 ENCOUNTER — Encounter: Payer: Self-pay | Admitting: Neurology

## 2023-07-06 ENCOUNTER — Other Ambulatory Visit (HOSPITAL_BASED_OUTPATIENT_CLINIC_OR_DEPARTMENT_OTHER): Payer: Self-pay

## 2023-07-08 ENCOUNTER — Other Ambulatory Visit (HOSPITAL_BASED_OUTPATIENT_CLINIC_OR_DEPARTMENT_OTHER): Payer: Self-pay

## 2023-07-25 ENCOUNTER — Other Ambulatory Visit (HOSPITAL_BASED_OUTPATIENT_CLINIC_OR_DEPARTMENT_OTHER): Payer: Self-pay

## 2023-07-25 ENCOUNTER — Other Ambulatory Visit: Payer: Self-pay

## 2023-08-02 ENCOUNTER — Other Ambulatory Visit (HOSPITAL_BASED_OUTPATIENT_CLINIC_OR_DEPARTMENT_OTHER): Payer: Self-pay

## 2023-09-11 IMAGING — CT CT MAXILLOFACIAL W/O CM
3 series · 15 of 47 positions shown, 18 images · non-contrast
Comparison: Head CT 03/05/2019.

CLINICAL DATA: 70-year-old male with history of headache and pain
in the region of the frontal and maxillary sinuses.



[Series 3: ax soft · axial · 0.31mm/px · z∈[-140,-50]mm · 9 of 53 slices shown, 12 images]
[im 4/53  brain]
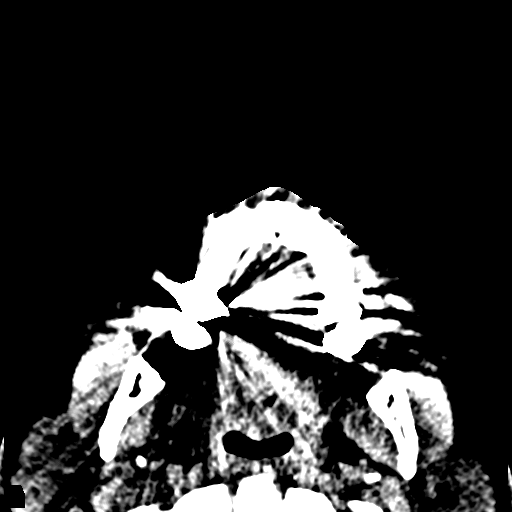
[im 4/53  bone]
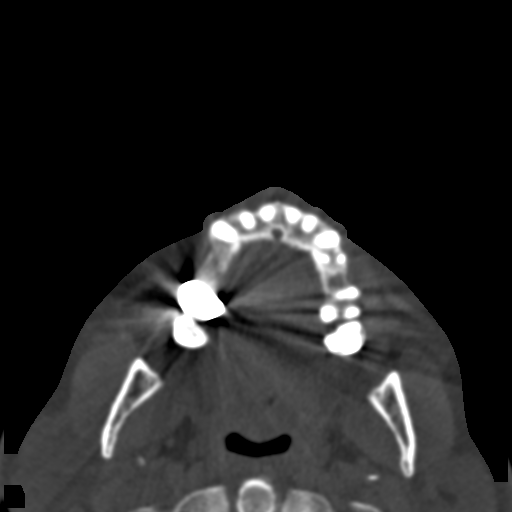
[im 9/53  bone]
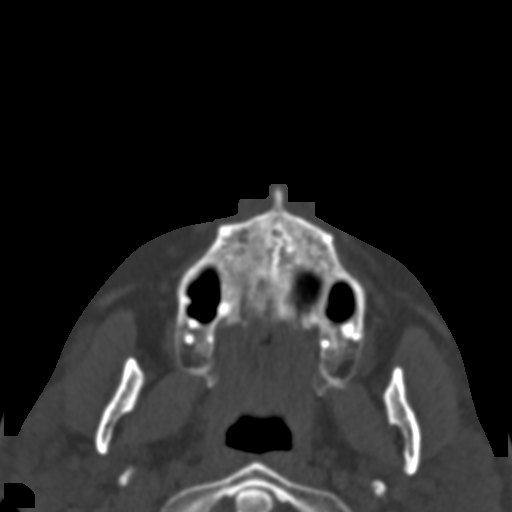
[im 15/53  bone]
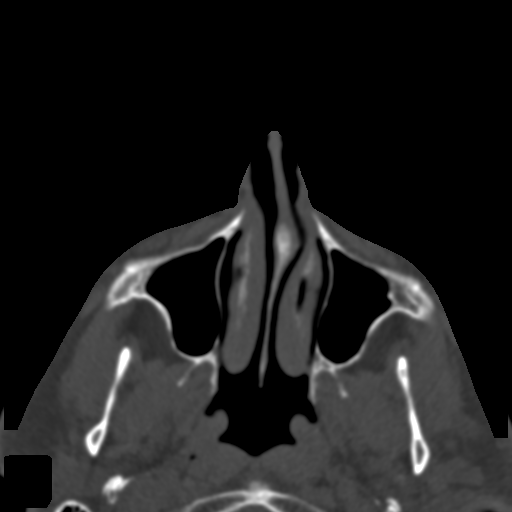
[im 20/53  bone]
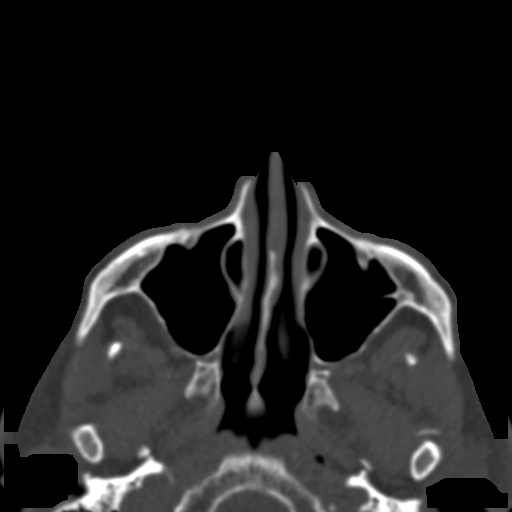
[im 27/53  brain]
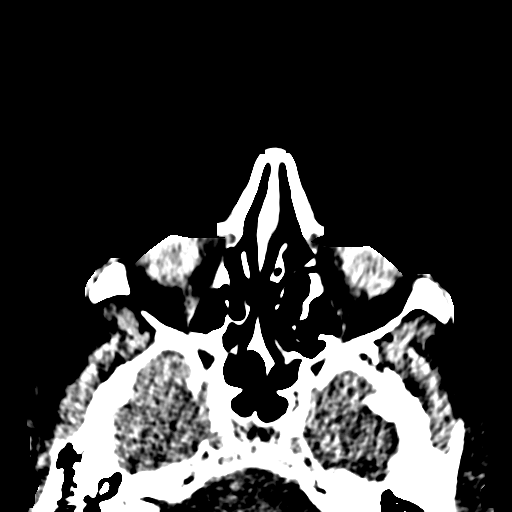
[im 27/53  bone]
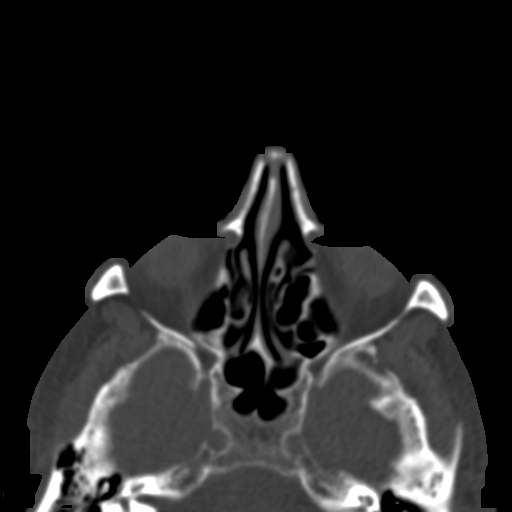
[im 33/53  bone]
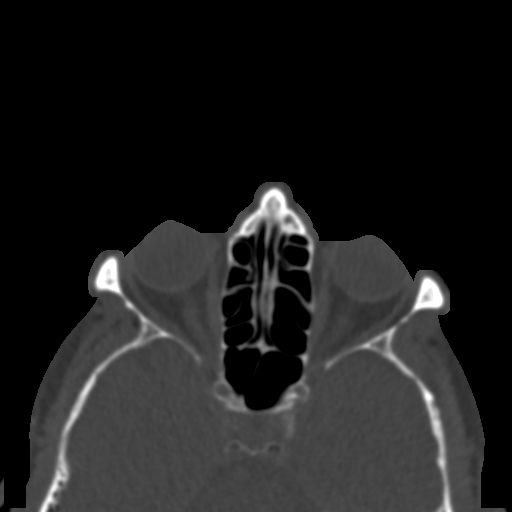
[im 38/53  bone]
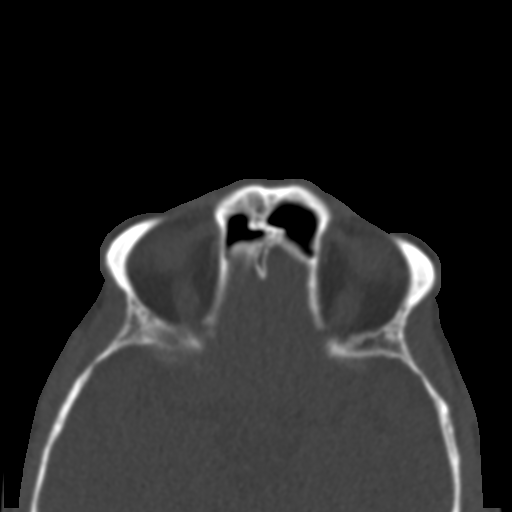
[im 44/53  bone]
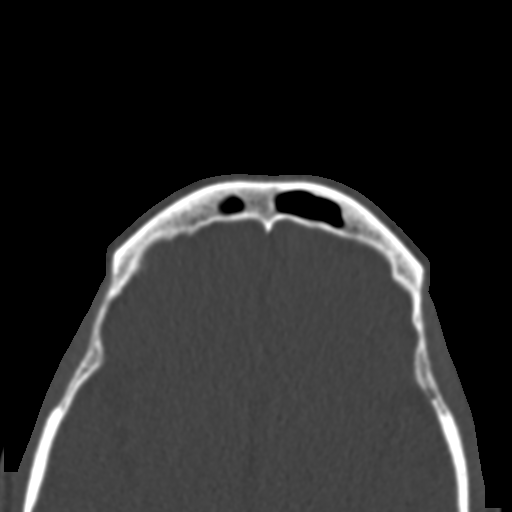
[im 49/53  brain]
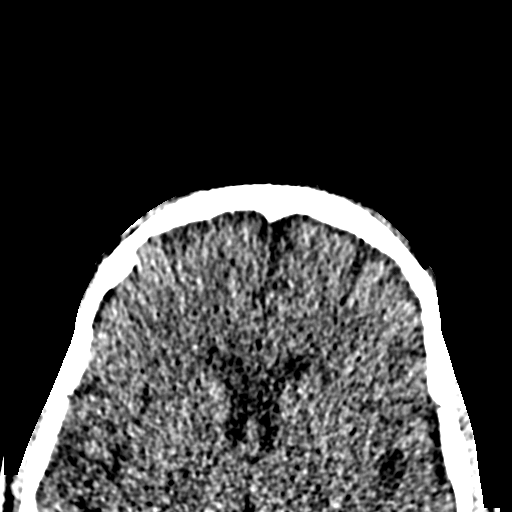
[im 49/53  bone]
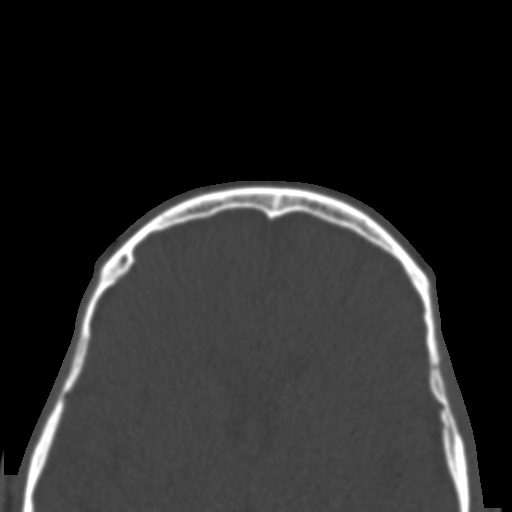

[Series 4: coronal bone · coronal · 0.27mm/px · 3 of 70 slices shown]
[im 24/70  bone]
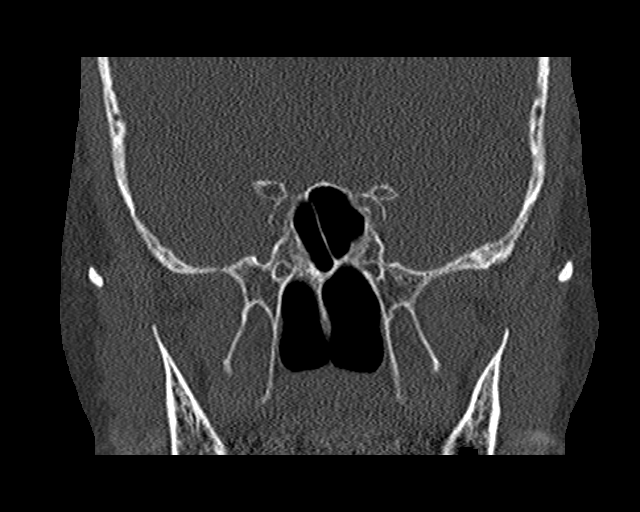
[im 31/70  bone]
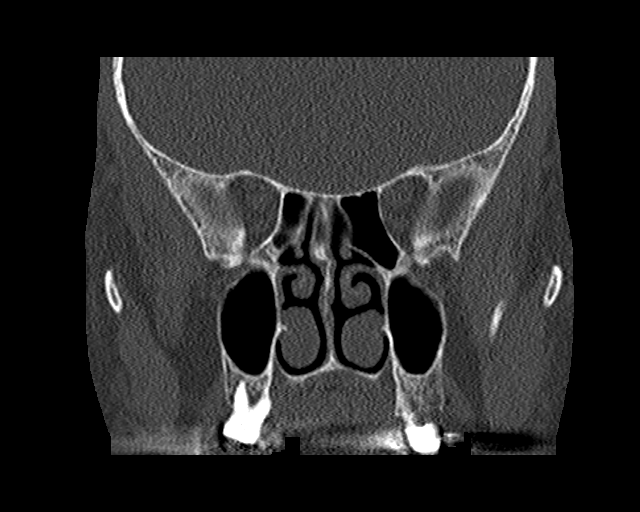
[im 39/70  bone]
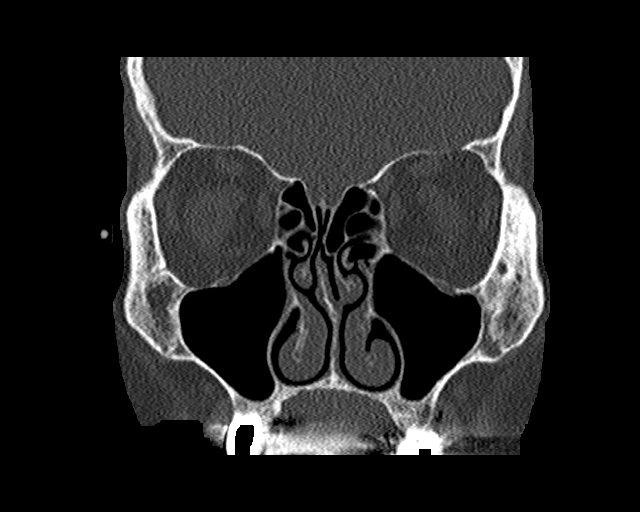

[Series 5: sagittal bone · sagittal · 0.25mm/px · 3 of 82 slices shown]
[im 28/82  bone]
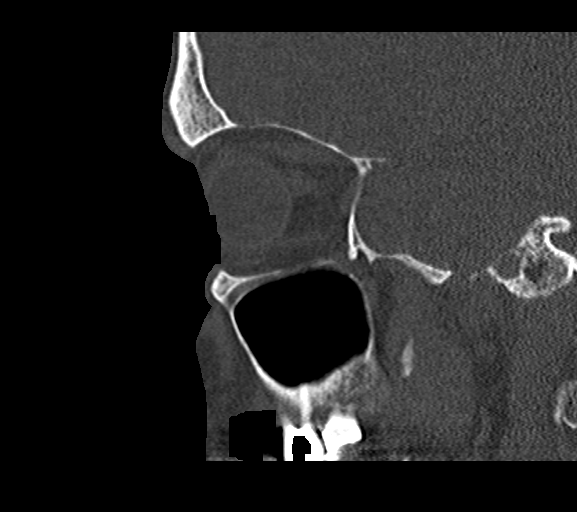
[im 41/82  bone]
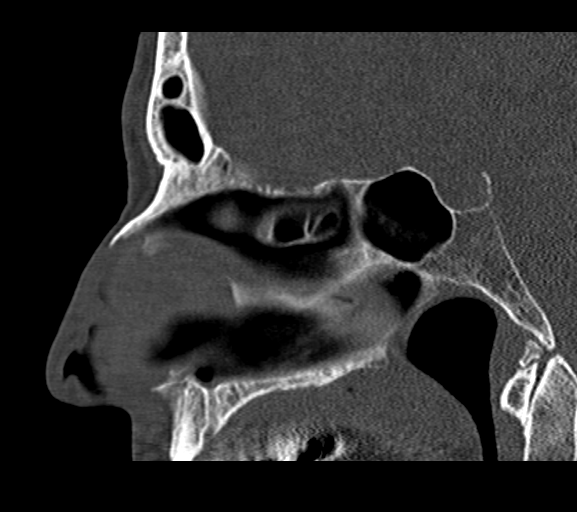
[im 55/82  bone]
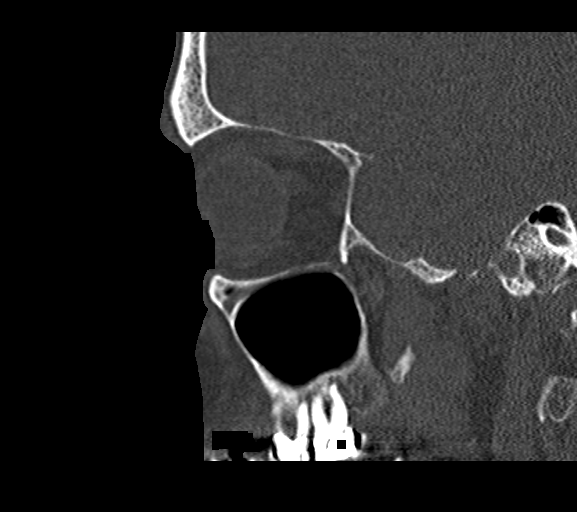

[15 of 47 positions shown; findings below may reference images not displayed]

FINDINGS: Osseous: No fracture or mandibular dislocation. No destructive
process.

Orbits: Negative. No traumatic or inflammatory finding.

Sinuses: Clear.

Soft tissues: Negative.

Limited intracranial: No significant or unexpected finding.
IMPRESSION: 1. No acute findings.  Specifically, paranasal sinuses are clear.

## 2023-10-29 ENCOUNTER — Encounter: Payer: Self-pay | Admitting: Cardiology

## 2023-10-29 ENCOUNTER — Other Ambulatory Visit (HOSPITAL_BASED_OUTPATIENT_CLINIC_OR_DEPARTMENT_OTHER): Payer: Self-pay

## 2023-10-29 MED ORDER — SILDENAFIL CITRATE 20 MG PO TABS
40.0000 mg | ORAL_TABLET | Freq: Every day | ORAL | 11 refills | Status: AC | PRN
  Filled 2023-10-29: qty 30, 6d supply, fill #0

## 2023-10-29 MED ORDER — TAMSULOSIN HCL 0.4 MG PO CAPS
0.8000 mg | ORAL_CAPSULE | Freq: Every day | ORAL | 3 refills | Status: AC
  Filled 2023-10-29: qty 180, 90d supply, fill #0
  Filled 2024-01-26: qty 180, 90d supply, fill #1
  Filled 2024-04-30: qty 180, 90d supply, fill #2
  Filled 2024-08-10: qty 180, 90d supply, fill #3

## 2023-11-07 IMAGING — DX DG KNEE 1-2V PORT*R*
1 series · 2 of 2 positions shown · non-contrast
Comparison: January 04, 2022.

CLINICAL DATA: Status post right knee replacement.

EXAM:
PORTABLE RIGHT KNEE - 1-2 VIEW

[Series 1: knee · 0.14mm/px · 2 of 2 slices shown]
[im 1/2]
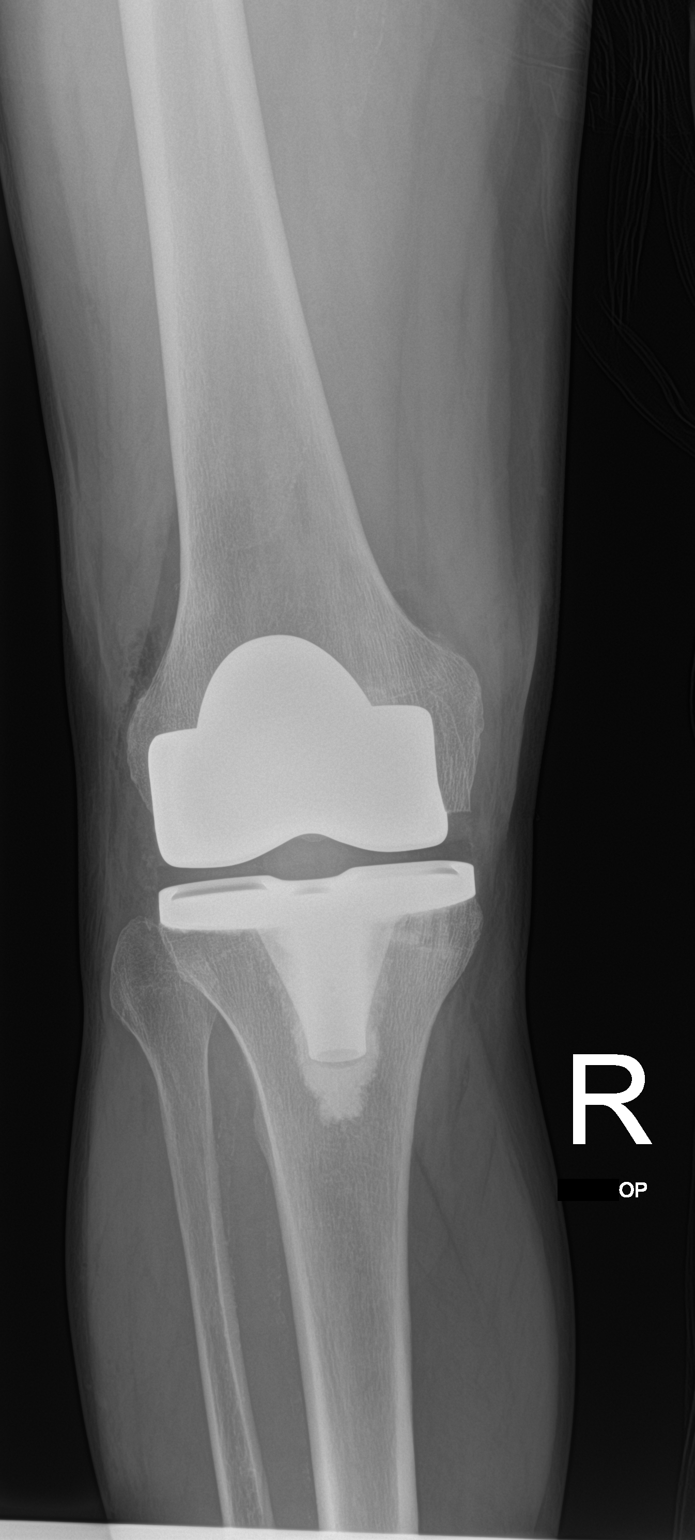
[im 2/2]
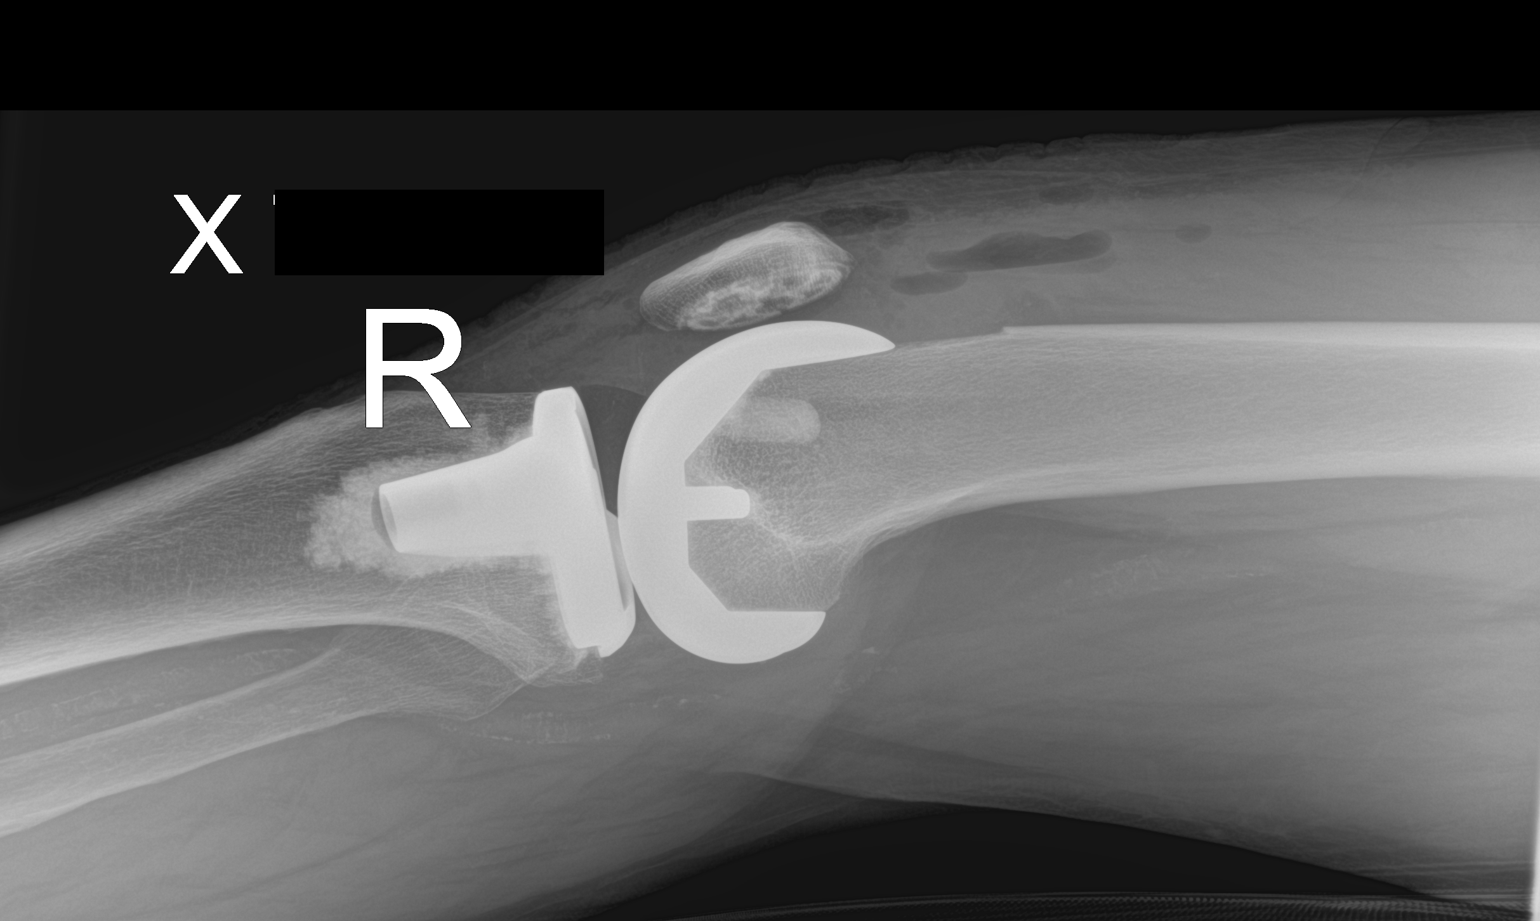

[2 of 2 positions shown; findings below may reference images not displayed]

FINDINGS: Right femoral and tibial components are well situated. Expected
postoperative changes are seen in the soft tissues anteriorly.
IMPRESSION: Status post right total knee arthroplasty.

## 2023-11-12 ENCOUNTER — Ambulatory Visit (INDEPENDENT_AMBULATORY_CARE_PROVIDER_SITE_OTHER): Admitting: Family

## 2023-11-12 ENCOUNTER — Other Ambulatory Visit (HOSPITAL_BASED_OUTPATIENT_CLINIC_OR_DEPARTMENT_OTHER): Payer: Self-pay

## 2023-11-12 ENCOUNTER — Encounter: Payer: Self-pay | Admitting: Family

## 2023-11-12 VITALS — BP 132/72 | HR 99 | Ht 70.0 in | Wt 180.6 lb

## 2023-11-12 DIAGNOSIS — M545 Low back pain, unspecified: Secondary | ICD-10-CM

## 2023-11-12 DIAGNOSIS — M79671 Pain in right foot: Secondary | ICD-10-CM

## 2023-11-12 MED ORDER — METHYLPREDNISOLONE 4 MG PO TBPK
ORAL_TABLET | ORAL | 0 refills | Status: DC
  Filled 2023-11-12: qty 21, 6d supply, fill #0

## 2023-11-12 MED ORDER — KETOROLAC TROMETHAMINE 30 MG/ML IJ SOLN
30.0000 mg | Freq: Once | INTRAMUSCULAR | Status: AC
  Administered 2023-11-12: 30 mg via INTRAMUSCULAR

## 2023-11-12 NOTE — Progress Notes (Signed)
 Erik Salazar is a 73 y.o. 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   Constipation 10/22/2022   Hypertrophied anal papilla 10/22/2022   Colon cancer screening 10/22/2022   Postop check 03/26/2022   Gait abnormality 01/29/2022   Status post total right knee replacement 01/18/2022   Coronary artery disease involving native coronary artery of native heart without angina pectoris 01/04/2022   Preop cardiovascular exam 01/04/2022   Primary osteoarthritis of right knee 01/04/2022   Acute right-sided low back pain without sciatica 12/25/2021   Aortic atherosclerosis (HCC) 09/04/2021   Spondylolisthesis at L4-L5 level 06/28/2021   S/P repair of ventral hernia 05/19/2021   S/P repair of recurrent ventral hernia 05/19/2021   Benign prostatic hyperplasia without lower urinary tract symptoms 10/14/2020   Chronic pain 10/14/2020   ED (erectile dysfunction) of organic origin 10/14/2020   Esophageal dysphagia 10/14/2020   Gastroesophageal reflux disease 10/14/2020   History of aortic valve replacement with bioprosthetic valve 10/14/2020   Hx of aortic aneurysm repair 10/14/2020   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   Thoracic aortic aneurysm without rupture (HCC) 10/14/2020   Recurrent incisional hernia 10/14/2020   Spinal stenosis in cervical region 05/04/2020   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   Cervical radiculopathy at C6 11/14/2017   Carpal tunnel 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 21 tablet 0   acetaminophen (TYLENOL) 325 MG tablet Take 650 mg by mouth every 6 (six) hours as needed for moderate pain. (Patient not taking: Reported on 07/02/2023)     amoxicillin (AMOXIL) 500 MG capsule Take 4 capsules by mouth 1 hour before dental appointment 16 capsule 0   ascorbic acid (VITAMIN C) 500 MG tablet Take 500 mg by mouth daily.     aspirin EC 81 MG tablet Take 81 mg by mouth daily. Swallow whole.     inclisiran (LEQVIO) 284 MG/1.5ML SOSY injection Inject 284 mg into the skin every 6 (six) months. 1.5 mL 0   levETIRAcetam (KEPPRA) 1000 MG tablet Take 1 tablet (1,000 mg total) by mouth 2 (two) times daily. 180 tablet 4   lubiprostone (AMITIZA) 8 MCG capsule Take 1 capsule (8 mcg total) by mouth 2 (two) times daily with a meal. (Patient taking differently: Take 8 mcg by mouth daily.) 180 capsule 3   metoprolol succinate (TOPROL-XL) 25 MG 24 hr tablet Take 1/2 tablet (12.5 mg total) by mouth daily. With or immediately following a meal. 45 tablet 3   Multiple Vitamins-Minerals (MULTIVITAMIN WITH MINERALS) tablet Take 1 tablet by mouth daily.     pravastatin (PRAVACHOL) 40 MG tablet Take 1 tablet (40 mg total) by mouth every evening. 90 tablet 1   sildenafil (REVATIO) 20 MG tablet Take 2-5 tablets (40-100 mg total) by mouth daily as needed 30 to 60 minutes before planned activity. 30 tablet 11   tamsulosin (FLOMAX) 0.4 MG CAPS capsule Take 2  capsules (0.8 mg total) by mouth daily. 180 capsule 3   No current facility-administered medications for this visit.    Allergies: Oxycodone hcl, Zetia [ezetimibe], Augmentin [amoxicillin-pot clavulanate], Chlorhexidine, Elemental sulfur, Oxytocin, and Sulfur  Past Medical History:  Diagnosis Date   Allergy see attached info   Arthritis    RA and OA   Blood transfusion without reported diagnosis     with 2nd heart surgery   Carpal tunnel syndrome on right 07/08/2017   Cervical radiculopathy at C6 11/14/2017   Right   Complication of anesthesia    Coronary artery disease    Enlarged prostate    Fibromyalgia    H/O: knee surgery    15    Hayfever    "WHEN YOUNG"   History of blood clots    History of kidney stones    History of open heart surgery    ASCENDING AORTIC ANEURYSM   Ischemic stroke of frontal lobe (HCC) 03/14/2017   Bilateral; post-redo CT surgery   Pneumonia    PONV (postoperative nausea and vomiting)    only after CABG surgeries   Seizure disorder (HCC) 03/14/2017   Seizures (HCC)    last one 02/2021,updated 09/25/22   Status post knee surgery    DVT POST KNEE SURGERY   Stroke [facility])     Past Surgical History:  Procedure Laterality Date   ANTERIOR CERVICAL DECOMP/DISCECTOMY FUSION N/A 05/04/2020   Procedure: Anterior Cervical Decompression/Discectomy Fuion Cervical three-four, Cervical four-five, Cervical five-six;  Surgeon: Donalee Citrin, MD;  Location: [facility] OR;  Service: Neurosurgery;  Laterality: N/A;   BACK SURGERY     lower back 06/28/2021   BALLOON DILATION N/A 06/23/2019   Procedure: BALLOON DILATION;  Surgeon: Kathi Der, MD;  Location: [facility];  Service: Gastroenterology;  Laterality: N/A;   BENTALL PROCEDURE  01/04/2016   Bentall with 23 mm pericardial AVR; SVG-LAD, SVG-CX [facility], [place])   BICEPS TENDON REPAIR Right    BIOPSY  06/23/2019   Procedure: BIOPSY;  Surgeon: Kathi Der, MD;  Location: [facility];  Service: Gastroenterology;;   CARDIAC SURGERY     ANUERSYM MAY 2017   Bentall procedure. Bioprosthetic aortic valve #23 mm bovine model #2700 TF ask, and 28 mm Gelweave woven vascular sinus of Valsalva graft   CARDIAC VALVE REPLACEMENT  02/2016, 02/2017   CARPAL TUNNEL RELEASE  08/2018   right hand    COLONOSCOPY WITH PROPOFOL N/A 10/22/2022   Procedure: COLONOSCOPY WITH PROPOFOL;  Surgeon: Shellia Cleverly,  DO;  Location: [facility];  Service: Gastroenterology;  Laterality: N/A;   CORONARY ARTERY BYPASS GRAFT  01/04/2016   VG to LAD & VG to LCX   CORONARY ARTERY BYPASS GRAFT  03/13/2017   LIMA to LAD with steril abcess removal from dacron graft   CYSTOSCOPY/URETEROSCOPY/HOLMIUM LASER/STENT PLACEMENT Left 06/12/2022   Procedure: CYSTOSCOPY/URETEROSCOPY/HOLMIUM LASER/STENT PLACEMENT;  Surgeon: Crista Elliot, MD;  Location: [facility];  Service: Urology;  Laterality: Left;   ESOPHAGOGASTRODUODENOSCOPY     Had done dilatation done about 2 or 3 times before in [place]   ESOPHAGOGASTRODUODENOSCOPY (EGD) WITH PROPOFOL N/A 06/23/2019   Procedure: ESOPHAGOGASTRODUODENOSCOPY (EGD) WITH PROPOFOL;  Surgeon: Kathi Der, MD;  Location: [facility];  Service: Gastroenterology;  Laterality: N/A;   FALSE ANEURYSM REPAIR  03/13/2017   redo sternotomy, sterile abscess removal from Dacron graft, omental flap around aorta, CABG: LIMA-LAD ([name], Dr. Elroy Channel)   GREATER OMENTAL FLAP CLOSURE  2018   Of pericardium 2018   INSERTION  OF MESH  10/14/2020   Procedure: INSERTION OF MESH;  Surgeon: Karie Soda, MD;  Location: [facility] OR;  Service: General;;   JOINT REPLACEMENT  knee   knee surgeries     13 surgeries on knee done before knee replacement    KNEE SURGERY  1983   LAPAROSCOPIC LYSIS OF ADHESIONS N/A 05/19/2021   Procedure: LYSIS OF ADHESIONS;  Surgeon: Karie Soda, MD;  Location: [facility] OR;  Service: General;  Laterality: N/A;  GEN & LOCAL   LYSIS OF ADHESION N/A 10/14/2020   Procedure: LYSIS OF ADHESION;  Surgeon: Karie Soda, MD;  Location: [facility] OR;  Service: General;  Laterality: N/A;   open heart surgery     03-13-2017   REPLACEMENT TOTAL KNEE Left 2015   ROTATOR CUFF REPAIR Right    done with biceps tendon repair   SPINE SURGERY  upper neck, lower spine   TONSILLECTOMY     removed as a child.   TOTAL KNEE ARTHROPLASTY Right 01/18/2022   Procedure: RIGHT TOTAL KNEE ARTHROPLASTY;  Surgeon: Tarry Kos, MD;  Location: [facility] OR;  Service: Orthopedics;  Laterality: Right;   VENTRAL HERNIA REPAIR N/A 10/14/2020   Procedure: LAPAROSCOPIC VENTRAL HERNIA REPAIR WITH TAP BLOCK BILATERAL;  Surgeon: Karie Soda, MD;  Location: [facility] OR;  Service: General;  Laterality: N/A;   VENTRAL HERNIA REPAIR N/A 05/19/2021   Procedure: LAPAROSCOPIC VENTRAL WALL HERNIA REPAIR;  Surgeon: Karie Soda, MD;  Location: [facility] OR;  Service: General;  Laterality: N/A;    Family History  Problem Relation Age of Onset   Ulcerative colitis Mother    Arthritis Mother    Aneurysm Mother        brain aneurysm for mother.    Arthritis Father    Ulcerative colitis Sister    Colon cancer Neg Hx    Esophageal cancer Neg Hx    Colon polyps Neg Hx    Crohn's disease Neg Hx    Rectal cancer Neg Hx    Stomach cancer Neg Hx     Social History   Tobacco Use   Smoking status: Former    Types: Cigars    Quit date: 2021    Years since quitting: 4.2   Smokeless tobacco: Never   Tobacco comments:    Only smoked cigars for 6 months  Substance Use Topics   Alcohol use: Yes    Alcohol/week: 1.0 standard drink of alcohol    Types: 1 Cans of beer per week    Comment: occassionally    Subjective:   Low Back pain x 4 days; "feels muscular in nature"; has had similar episodes in the past; typically responds to Toradol injection/ oral steroid; no changes in bowel/bladder habits;   Objective:  Vitals:   11/12/23 1354  BP: 132/72  Pulse: 99  SpO2: 100%  Weight: 180 lb 9.6 oz (81.9 kg)  Height: 5\' 10"  (1.778 m)    General: Well developed, well nourished, in no acute distress  Skin : Warm and dry.  Head: Normocephalic and atraumatic  Lungs: Respirations unlabored; clear to auscultation bilaterally without wheeze, rales, rhonchi  Musculoskeletal: No deformities; no active joint inflammation  Extremities: No edema, cyanosis, clubbing  Vessels: Symmetric bilaterally  Neurologic: Alert and oriented; speech intact; face  symmetrical; moves all extremities well; CNII-XII intact without focal deficit   Assessment:  1. Acute low back pain, unspecified back pain laterality, unspecified whether sciatica present   2. Right foot pain     Plan:  Toradol  IM 30 mg given as requested; per patient, this treatment has worked well for him in the past; start Medrol Dose pak tomorrow as directed; if no improvement by Friday, to call back for imaging and/or referral to orthopedist; Refer to podiatrist;   No follow-ups on file.  Orders Placed This Encounter  Procedures   Ambulatory referral to Podiatry    Referral Priority:   Routine    Referral Type:   Consultation    Referral Reason:   Specialty Services Required    Requested Specialty:   Podiatry    Number of Visits Requested:   1    Requested Prescriptions   Signed Prescriptions Disp Refills   methylPREDNISolone (MEDROL DOSEPAK) 4 MG TBPK tablet 21 tablet 0    Sig: Take as directed on package for 6 days.

## 2023-11-15 ENCOUNTER — Telehealth: Payer: Self-pay | Admitting: Pharmacist

## 2023-11-15 DIAGNOSIS — E782 Mixed hyperlipidemia: Secondary | ICD-10-CM

## 2023-11-15 NOTE — Telephone Encounter (Signed)
 Patient needs labs for [facility] He will go to [facility] on Monday

## 2023-11-18 ENCOUNTER — Encounter: Payer: Self-pay | Admitting: Family

## 2023-11-19 ENCOUNTER — Other Ambulatory Visit: Payer: Self-pay | Admitting: Family

## 2023-11-19 ENCOUNTER — Encounter: Payer: Self-pay | Admitting: Pharmacist

## 2023-11-19 ENCOUNTER — Telehealth: Payer: Self-pay

## 2023-11-19 DIAGNOSIS — M545 Low back pain, unspecified: Secondary | ICD-10-CM

## 2023-11-19 LAB — LIPID PANEL
Chol/HDL Ratio: 2.7 ratio (ref 0.0–5.0)
Cholesterol, Total: 117 mg/dL (ref 100–199)
HDL: 43 mg/dL (ref >39)
LDL Chol Calc (NIH): 47 mg/dL (ref 0–99)
Triglycerides: 163 mg/dL — ABNORMAL HIGH (ref 0–149)
VLDL Cholesterol Cal: 27 mg/dL (ref 5–40)

## 2023-11-19 NOTE — Telephone Encounter (Signed)
 Auth Submission: APPROVED - Renewal Site of care: Site of care: [facility] Payer: [employer] medicare Medication & CPT/J Code(s) submitted: Leqvio (Inclisiran) [phone] Route of submission (phone, fax, portal): Fax Phone # Fax # [phone] Auth type: Buy/Bill PB Units/visits requested: 284mg  x 2 doses Reference number: [id number] Approval from: 11/19/23 to 11/18/24

## 2023-11-26 ENCOUNTER — Encounter: Admitting: Orthopaedic Surgery

## 2023-11-28 ENCOUNTER — Encounter: Admitting: Orthopaedic Surgery

## 2023-12-04 ENCOUNTER — Encounter: Admitting: Physical Medicine and Rehabilitation

## 2023-12-10 ENCOUNTER — Other Ambulatory Visit (HOSPITAL_BASED_OUTPATIENT_CLINIC_OR_DEPARTMENT_OTHER): Payer: Self-pay

## 2023-12-10 MED ORDER — AMOXICILLIN 500 MG PO CAPS
2000.0000 mg | ORAL_CAPSULE | ORAL | 0 refills | Status: AC | PRN
  Filled 2023-12-10: qty 16, 4d supply, fill #0

## 2023-12-12 ENCOUNTER — Ambulatory Visit (INDEPENDENT_AMBULATORY_CARE_PROVIDER_SITE_OTHER): Payer: Medicare HMO | Admitting: *Deleted

## 2023-12-12 VITALS — BP 121/57 | HR 71 | Temp 97.6°F | Resp 16 | Ht 70.0 in | Wt 179.8 lb

## 2023-12-12 DIAGNOSIS — I639 Cerebral infarction, unspecified: Secondary | ICD-10-CM

## 2023-12-12 DIAGNOSIS — E782 Mixed hyperlipidemia: Secondary | ICD-10-CM | POA: Diagnosis not present

## 2023-12-12 DIAGNOSIS — I2510 Atherosclerotic heart disease of native coronary artery without angina pectoris: Secondary | ICD-10-CM | POA: Diagnosis not present

## 2023-12-12 MED ORDER — INCLISIRAN SODIUM 284 MG/1.5ML ~~LOC~~ SOSY
284.0000 mg | PREFILLED_SYRINGE | Freq: Once | SUBCUTANEOUS | Status: AC
  Administered 2023-12-12: 284 mg via SUBCUTANEOUS
  Filled 2023-12-12: qty 1.5

## 2023-12-12 NOTE — Progress Notes (Signed)
 Diagnosis: Hyperlipidemia  Provider:  Chilton Greathouse MD  Procedure: Injection  Leqvio (inclisiran), Dose: 284 mg, Site: subcutaneous, Number of injections: 1  Injection Site(s): Left arm  Post Care: Observation period completed  Discharge: Condition: Good, Destination: Home . AVS Provided  Performed by:  Forrest Moron, RN

## 2023-12-18 ENCOUNTER — Other Ambulatory Visit: Payer: Self-pay | Admitting: Cardiology

## 2023-12-19 ENCOUNTER — Other Ambulatory Visit (HOSPITAL_BASED_OUTPATIENT_CLINIC_OR_DEPARTMENT_OTHER): Payer: Self-pay

## 2023-12-19 MED ORDER — PRAVASTATIN SODIUM 40 MG PO TABS
40.0000 mg | ORAL_TABLET | Freq: Every evening | ORAL | 0 refills | Status: DC
  Filled 2023-12-19: qty 90, 90d supply, fill #0

## 2023-12-25 ENCOUNTER — Other Ambulatory Visit: Payer: Self-pay | Admitting: Gastroenterology

## 2023-12-25 ENCOUNTER — Other Ambulatory Visit (HOSPITAL_BASED_OUTPATIENT_CLINIC_OR_DEPARTMENT_OTHER): Payer: Self-pay

## 2023-12-25 ENCOUNTER — Other Ambulatory Visit: Payer: Self-pay

## 2023-12-25 MED ORDER — LUBIPROSTONE 8 MCG PO CAPS
8.0000 ug | ORAL_CAPSULE | Freq: Two times a day (BID) | ORAL | 0 refills | Status: DC
  Filled 2023-12-25: qty 180, 90d supply, fill #0

## 2023-12-26 ENCOUNTER — Other Ambulatory Visit: Payer: Self-pay

## 2023-12-31 ENCOUNTER — Ambulatory Visit (INDEPENDENT_AMBULATORY_CARE_PROVIDER_SITE_OTHER): Payer: Medicare HMO | Admitting: Neurology

## 2023-12-31 ENCOUNTER — Other Ambulatory Visit (HOSPITAL_BASED_OUTPATIENT_CLINIC_OR_DEPARTMENT_OTHER): Payer: Self-pay

## 2023-12-31 VITALS — BP 107/68 | HR 93 | Ht 70.0 in | Wt 181.5 lb

## 2023-12-31 DIAGNOSIS — G250 Essential tremor: Secondary | ICD-10-CM

## 2023-12-31 DIAGNOSIS — R569 Unspecified convulsions: Secondary | ICD-10-CM

## 2023-12-31 DIAGNOSIS — I639 Cerebral infarction, unspecified: Secondary | ICD-10-CM | POA: Diagnosis not present

## 2023-12-31 MED ORDER — LEVETIRACETAM 750 MG PO TABS
1500.0000 mg | ORAL_TABLET | Freq: Two times a day (BID) | ORAL | 4 refills | Status: AC
  Filled 2023-12-31: qty 360, 90d supply, fill #0
  Filled 2024-03-30: qty 360, 90d supply, fill #1
  Filled 2024-06-26: qty 360, 90d supply, fill #2
  Filled 2024-09-25: qty 360, 90d supply, fill #3
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
## 2024-01-09 ENCOUNTER — Other Ambulatory Visit (HOSPITAL_BASED_OUTPATIENT_CLINIC_OR_DEPARTMENT_OTHER): Payer: Self-pay | Admitting: Neurosurgery

## 2024-01-09 DIAGNOSIS — M4316 Spondylolisthesis, lumbar region: Secondary | ICD-10-CM

## 2024-01-15 ENCOUNTER — Ambulatory Visit (INDEPENDENT_AMBULATORY_CARE_PROVIDER_SITE_OTHER)

## 2024-01-15 VITALS — BP 116/71 | Ht 70.0 in | Wt 177.0 lb

## 2024-01-15 DIAGNOSIS — Z5320 Procedure and treatment not carried out because of patient's decision for unspecified reasons: Secondary | ICD-10-CM | POA: Diagnosis not present

## 2024-01-15 DIAGNOSIS — Z2821 Immunization not carried out because of patient refusal: Secondary | ICD-10-CM | POA: Diagnosis not present

## 2024-01-15 DIAGNOSIS — Z0000 Encounter for general adult medical examination without abnormal findings: Secondary | ICD-10-CM | POA: Diagnosis not present

## 2024-01-15 NOTE — Progress Notes (Signed)
 Because this visit was a virtual/telehealth visit,  certain criteria was not obtained, such a blood pressure, CBG if applicable, and timed get up and go. Any medications not marked as "taking" were not mentioned during the medication reconciliation part of the visit. Any vitals not documented were not able to be obtained due to this being a telehealth visit or patient was unable to self-report a recent blood pressure reading due to a lack of equipment at home via telehealth. Vitals that have been documented are verbally provided by the patient.   This visit was performed by a medical professional under my direct supervision. I was immediately available for consultation/collaboration. I have reviewed and agree with the Annual Wellness Visit documentation.  Subjective:   Erik Salazar is a 73 y.o. who presents for a Medicare Wellness preventive visit.  As a reminder, Annual Wellness Visits don't include a physical exam, and some assessments may be limited, especially if this visit is performed virtually. We may recommend an in-person follow-up visit with your provider if needed.  Visit Complete: Virtual I connected with  Erik Salazar on 01/15/24 by a audio enabled telemedicine application and verified that I am speaking with the correct person using two identifiers.  Patient Location: Home  Provider Location: Home Office  I discussed the limitations of evaluation and management by telemedicine. The patient expressed understanding and agreed to proceed.  Vital Signs: Because this visit was a virtual/telehealth visit, some criteria may be missing or patient reported. Any vitals not documented were not able to be obtained and vitals that have been documented are patient reported.  VideoDeclined- This patient declined Librarian, academic. Therefore the visit was completed with audio only.  Persons Participating in Visit: Patient.  AWV Questionnaire: No: Patient Medicare  AWV questionnaire was not completed prior to this visit.  Cardiac Risk Factors include: advanced age (>23men, >26 women);male gender;hypertension     Objective:     Today's Vitals   01/15/24 1135  BP: 116/71  Weight: 177 lb (80.3 kg)  Height: 5\' 10"  (1.778 m)   Body mass index is 25.4 kg/m.     01/15/2024   11:40 AM 12/27/2022    2:19 PM 10/22/2022    6:56 AM 06/23/2022    1:54 PM 04/03/2022    8:07 AM 02/01/2022   10:57 AM 01/11/2022    8:25 AM  Advanced Directives  Does Patient Have a Medical Advance Directive? Yes Yes No No Yes Yes Yes  Type of Advance Directive Living will Healthcare Power of [place];Living will   Healthcare Power of [place];Living will;Out of facility DNR (pink MOST or yellow form) Healthcare Power of [place];Living will;Out of facility DNR (pink MOST or yellow form) Healthcare Power of [place];Living will;Out of facility DNR (pink MOST or yellow form)  Does patient want to make changes to medical advance directive? No - Patient declined No - Patient declined    No - Patient declined No - Patient declined  Copy of Healthcare Power of Attorney in Chart? Yes - validated most recent copy scanned in chart (See row information) Yes - validated most recent copy scanned in chart (See row information)   Yes - validated most recent copy scanned in chart (See row information) Yes - validated most recent copy scanned in chart (See row information) Yes - validated most recent copy scanned in chart (See row information)  Would patient like information on creating a medical advance directive?   No - Patient declined  Current Medications (verified) Outpatient Encounter Medications as of 01/15/2024  Medication Sig   amoxicillin  (AMOXIL ) 500 MG capsule Take 4 capsules (2,000 mg total) by mouth 1 hour prior to dental appointment.   ascorbic acid  (VITAMIN C) 500 MG tablet Take 500 mg by mouth daily.   aspirin  EC 81 MG tablet Take 81 mg by mouth daily. Swallow whole.    inclisiran (LEQVIO ) 284 MG/1.5ML SOSY injection Inject 284 mg into the skin every 6 (six) months.   levETIRAcetam  (KEPPRA ) 750 MG tablet Take 2 tablets (1,500 mg total) by mouth 2 (two) times daily.   lubiprostone  (AMITIZA ) 8 MCG capsule Take 1 capsule (8 mcg total) by mouth 2 (two) times daily with a meal. Patient needs follow up appointment for future refills. Please call [phone] to schedule an appointment.   metoprolol  succinate (TOPROL -XL) 25 MG 24 hr tablet Take 1/2 tablet (12.5 mg total) by mouth daily. With or immediately following a meal.   Multiple Vitamins-Minerals (MULTIVITAMIN WITH MINERALS) tablet Take 1 tablet by mouth daily.   pravastatin  (PRAVACHOL ) 40 MG tablet Take 1 tablet (40 mg total) by mouth every evening.   sildenafil  (REVATIO ) 20 MG tablet Take 2-5 tablets (40-100 mg total) by mouth daily as needed 30 to 60 minutes before planned activity.   tamsulosin  (FLOMAX ) 0.4 MG CAPS capsule Take 2 capsules (0.8 mg total) by mouth daily.   acetaminophen  (TYLENOL ) 325 MG tablet Take 650 mg by mouth every 6 (six) hours as needed for moderate pain. (Patient not taking: Reported on 07/02/2023)   No facility-administered encounter medications on file as of 01/15/2024.    Allergies (verified) Oxycodone hcl, Zetia  [ezetimibe ], Augmentin [amoxicillin -pot clavulanate], Chlorhexidine , Elemental sulfur, Oxytocin, and Sulfur   History: Past Medical History:  Diagnosis Date   Allergy see attached info   Arthritis    RA and OA   Blood transfusion without reported diagnosis    with 2nd heart surgery   Carpal tunnel syndrome on right 07/08/2017   Cervical radiculopathy at C6 11/14/2017   Right   Complication of anesthesia    Coronary artery disease    Enlarged prostate    Fibromyalgia    H/O: knee surgery    15    Hayfever    "WHEN YOUNG"   History of blood clots    History of kidney stones    History of open heart surgery    ASCENDING AORTIC ANEURYSM   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   Bilateral; post-redo CT surgery   Pneumonia    PONV (postoperative nausea and vomiting)    only after CABG surgeries   Seizure disorder (HCC) 03/14/2017   Seizures (HCC)    last one 02/2021,updated 09/25/22   Status post knee surgery    DVT POST KNEE SURGERY   Stroke [facility])    Past Surgical History:  Procedure Laterality Date   ANTERIOR CERVICAL DECOMP/DISCECTOMY FUSION N/A 05/04/2020   Procedure: Anterior Cervical Decompression/Discectomy Fuion Cervical three-four, Cervical four-five, Cervical five-six;  Surgeon: Gearl Keens, MD;  [facility];  Service: Neurosurgery;  Laterality: N/A;   BACK SURGERY     lower back 06/28/2021   BALLOON DILATION N/A 06/23/2019   Procedure: BALLOON DILATION;  Surgeon: Felecia Hopper, MD;  Location: [facility] ENDOSCOPY;  Service: Gastroenterology;  Laterality: N/A;   BENTALL PROCEDURE  01/04/2016   Bentall with 23 mm pericardial AVR; SVG-LAD, SVG-CX [facility] [place])   BICEPS TENDON REPAIR Right    BIOPSY  06/23/2019   Procedure:  BIOPSY;  Surgeon: Felecia Hopper, MD;  Location: [facility];  Service: Gastroenterology;;   CARDIAC SURGERY     ANUERSYM MAY 2017   Bentall procedure. Bioprosthetic aortic valve #23 mm bovine model #2700 TF ask, and 28 mm Gelweave woven vascular sinus of Valsalva graft   CARDIAC VALVE REPLACEMENT  02/2016, 02/2017   CARPAL TUNNEL RELEASE  08/2018   right hand    COLONOSCOPY WITH PROPOFOL  N/A 10/22/2022   Procedure: COLONOSCOPY WITH PROPOFOL ;  Surgeon: Annis Kinder, DO;  Location: [facility];  Service: Gastroenterology;  Laterality: N/A;   CORONARY ARTERY BYPASS GRAFT  01/04/2016   VG to LAD & VG to LCX   CORONARY ARTERY BYPASS GRAFT  03/13/2017   LIMA to LAD with steril abcess removal from dacron graft   CYSTOSCOPY/URETEROSCOPY/HOLMIUM LASER/STENT PLACEMENT Left 06/12/2022   Procedure: CYSTOSCOPY/URETEROSCOPY/HOLMIUM LASER/STENT PLACEMENT;  Surgeon: Samson Croak, MD;   Location: [facility] OR[facility];  Service: Urology;  Laterality: Left;   ESOPHAGOGASTRODUODENOSCOPY     Had done dilatation done about 2 or 3 times before in [place]   ESOPHAGOGASTRODUODENOSCOPY (EGD) WITH PROPOFOL  N/A 06/23/2019   Procedure: ESOPHAGOGASTRODUODENOSCOPY (EGD) WITH PROPOFOL ;  Surgeon: Felecia Hopper, MD;  Location: [facility];  Service: Gastroenterology;  Laterality: N/A;   FALSE ANEURYSM REPAIR  03/13/2017   redo sternotomy, sterile abscess removal from Dacron graft, omental flap around aorta, CABG: LIMA-LAD ([name], Dr. Gevena Kugel)   GREATER OMENTAL FLAP CLOSURE  2018   Of pericardium 2018   INSERTION OF MESH  10/14/2020   Procedure: INSERTION OF MESH;  Surgeon: Candyce Champagne, MD;  Location: [facility] OR;  Service: General;;   JOINT REPLACEMENT  knee   knee surgeries     13 surgeries on knee done before knee replacement    KNEE SURGERY  1983   LAPAROSCOPIC LYSIS OF ADHESIONS N/A 05/19/2021   Procedure: LYSIS OF ADHESIONS;  Surgeon: Candyce Champagne, MD;  Location: [facility] OR;  Service: General;  Laterality: N/A;  GEN & LOCAL   LYSIS OF ADHESION N/A 10/14/2020   Procedure: LYSIS OF ADHESION;  Surgeon: Candyce Champagne, MD;  Location: [facility] OR;  Service: General;  Laterality: N/A;   open heart surgery     03-13-2017   REPLACEMENT TOTAL KNEE Left 2015   ROTATOR CUFF REPAIR Right    done with biceps tendon repair   SPINE SURGERY  upper neck, lower spine   TONSILLECTOMY     removed as a child.   TOTAL KNEE ARTHROPLASTY Right 01/18/2022   Procedure: RIGHT TOTAL KNEE ARTHROPLASTY;  Surgeon: Wes Hamman, MD;  Location: [facility] OR;  Service: Orthopedics;  Laterality: Right;   VENTRAL HERNIA REPAIR N/A 10/14/2020   Procedure: LAPAROSCOPIC VENTRAL HERNIA REPAIR WITH TAP BLOCK BILATERAL;  Surgeon: Candyce Champagne, MD;  Location[facility] OR;  Service: General;  Laterality: N/A;   VENTRAL HERNIA REPAIR N/A 05/19/2021   Procedure: LAPAROSCOPIC VENTRAL WALL HERNIA REPAIR;  Surgeon: Candyce Champagne, MD;  Location: [facility] OR;  Service:  General;  Laterality: N/A;   Family History  Problem Relation Age of Onset   Ulcerative colitis Mother    Arthritis Mother    Aneurysm Mother        brain aneurysm for mother.    Arthritis Father    Ulcerative colitis Sister    Colon cancer Neg Hx    Esophageal cancer Neg Hx    Colon polyps Neg Hx    Crohn's disease Neg Hx    Rectal cancer Neg Hx    Stomach  cancer Neg Hx    Social History   Socioeconomic History   Marital status: Married    Spouse name: Not on file   Number of children: 3   Years of education: Not on file   Highest education level: Some college, no degree  Occupational History   Occupation: RETIRED  Tobacco Use   Smoking status: Former    Types: Cigars    Quit date: 2021    Years since quitting: 4.4   Smokeless tobacco: Never   Tobacco comments:    Only smoked cigars for 6 months  Vaping Use   Vaping status: Never Used  Substance and Sexual Activity   Alcohol use: Yes    Alcohol/week: 1.0 standard drink of alcohol    Types: 1 Cans of beer per week    Comment: occassionally   Drug use: No   Sexual activity: Not on file  Other Topics Concern   Not on file  Social History Narrative   Lives at home with wife, is retired.  Education: 2 yrs college.   2 Children.   Social Drivers of Corporate investment banker Strain: Low Risk  (01/15/2024)   Overall Financial Resource Strain (CARDIA)    Difficulty of Paying Living Expenses: Not hard at all  Food Insecurity: No Food Insecurity (01/15/2024)   Hunger Vital Sign    Worried About Running Out of Food in the Last Year: Never true    Ran Out of Food in the Last Year: Never true  Transportation Needs: No Transportation Needs (01/15/2024)   PRAPARE - Administrator, Civil Service (Medical): No    Lack of Transportation (Non-Medical): No  Physical Activity: Sufficiently Active (01/15/2024)   Exercise Vital Sign    Days of Exercise per Week: 5 days    Minutes of Exercise per Session: 60 min   Recent Concern: Physical Activity - Insufficiently Active (11/11/2023)   Exercise Vital Sign    Days of Exercise per Week: 3 days    Minutes of Exercise per Session: 30 min  Stress: No Stress Concern Present (01/15/2024)   [employer] of Occupational Health - Occupational Stress Questionnaire    Feeling of Stress : Not at all  Social Connections: Unknown (01/15/2024)   Social Connection and Isolation Panel [NHANES]    Frequency of Communication with Friends and Family: Twice a week    Frequency of Social Gatherings with Friends and Family: Twice a week    Attends Religious Services: Patient declined    Database administrator or Organizations: No    Attends Engineer, structural: 1 to 4 times per year    Marital Status: Married    Tobacco Counseling Counseling given: Not Answered Tobacco comments: Only smoked cigars for 6 months    Clinical Intake:  Pre-visit preparation completed: Yes  Pain : No/denies pain     BMI - recorded: 25.4 Nutritional Status: BMI 25 -29 Overweight Nutritional Risks: None Diabetes: No  Lab Results  Component Value Date   HGBA1C 6.2 05/07/2022     How often do you need to have someone help you when you read instructions, pamphlets, or other written materials from your doctor or pharmacy?: 1 - Never What is the last grade level you completed in school?: some college  Interpreter Needed?: No  Information entered by :: Juliann Ochoa   Activities of Daily Living     01/15/2024   11:39 AM  In your present state of health, do you  have any difficulty performing the following activities:  Hearing? 0  Vision? 0  Difficulty concentrating or making decisions? 0  Walking or climbing stairs? 0  Dressing or bathing? 0  Doing errands, shopping? 0  Preparing Food and eating ? N  Using the Toilet? N  In the past six months, have you accidently leaked urine? N  Do you have problems with loss of bowel control? N  Managing your  Medications? N  Managing your Finances? N  Housekeeping or managing your Housekeeping? N    Patient Care Team: Adra Alanis, FNP as PCP - General (Internal Medicine) Hugh Madura, MD as PCP - Cardiology (Cardiology) Jinny Mounts, PA-C as Physician Assistant (Internal Medicine) Shon Downing, MD as Consulting Physician (Cardiothoracic Surgery) Felecia Hopper, MD as Consulting Physician (Gastroenterology) Candyce Champagne, MD as Consulting Physician (General Surgery) Wes Hamman, MD as Attending Physician (Orthopedic Surgery)  Indicate any recent Medical Services you may have received from other than [facility] providers in the past year (date may be approximate).     Assessment:    This is a routine wellness examination for Erik Salazar.  Hearing/Vision screen Hearing Screening - Comments:: No hearing difficulties  Vision Screening - Comments:: Patient only wears readers    Goals Addressed             This Visit's Progress    Patient Stated       Patient would like to walk 4 miles        Depression Screen     01/15/2024   11:41 AM 12/27/2022    2:28 PM 09/21/2022    8:41 AM 07/03/2022    2:09 PM 05/03/2022    1:05 PM 12/21/2021    9:57 AM 12/21/2021    9:55 AM  PHQ 2/9 Scores  PHQ - 2 Score 0 0 0 2 0 0 0  PHQ- 9 Score 0   4       Fall Risk     01/15/2024   11:40 AM 12/20/2022   10:05 AM 09/21/2022    8:40 AM 07/03/2022    1:05 PM 05/03/2022    1:05 PM  Fall Risk   Falls in the past year? 0 0 0 0 0  Number falls in past yr: 0 0 0 0 0  Injury with Fall? 0 0 0 0 0  Risk for fall due to : No Fall Risks No Fall Risks No Fall Risks No Fall Risks No Fall Risks  Follow up Falls evaluation completed Falls evaluation completed Falls evaluation completed Falls evaluation completed Falls evaluation completed    MEDICARE RISK AT HOME:  Medicare Risk at Home Any stairs in or around the home?: Yes If so, are there any without handrails?: No Home free of loose throw rugs  in walkways, pet beds, electrical cords, etc?: Yes Adequate lighting in your home to reduce risk of falls?: Yes Life alert?: No Use of a cane, walker or w/c?: No Grab bars in the bathroom?: Yes Shower chair or bench in shower?: Yes Elevated toilet seat or a handicapped toilet?: Yes  TIMED UP AND GO:  Was the test performed?  no  Cognitive Function: 6CIT completed    12/27/2022    2:32 PM  MMSE - Mini Mental State Exam  Not completed: Unable to complete        01/15/2024   11:36 AM 12/21/2021    9:59 AM  6CIT Screen  What Year? 0 points 0 points  What month? 0  points 0 points  What time? 0 points 0 points  Count back from 20 0 points 0 points  Months in reverse 0 points 0 points  Repeat phrase 0 points 0 points  Total Score 0 points 0 points    Immunizations Immunization History  Administered Date(s) Administered   Fluad Trivalent(High Dose 65+) 05/08/2023   PFIZER(Purple Top)SARS-COV-2 Vaccination 10/12/2019, 11/02/2019, 11/23/2020   Pfizer Covid-19 Vaccine Bivalent Booster 71yrs & up 05/10/2021, 12/18/2021   Pfizer(Comirnaty )Fall Seasonal Vaccine 12 years and older 06/06/2022, 05/08/2023   Pneumococcal Polysaccharide-23 05/21/2016   Respiratory Syncytial Virus Vaccine ,Recomb Aduvanted(Arexvy ) 10/10/2022   Tdap 11/14/2016   Zoster, Live 08/01/2017, 11/07/2017    Screening Tests Health Maintenance  Topic Date Due   Hepatitis C Screening  Never done   Zoster Vaccines- Shingrix (1 of 2) 04/28/1970   Pneumonia Vaccine 45+ Years old (2 of 2 - PCV) 05/21/2017   COVID-19 Vaccine (9 - Pfizer risk 2024-25 season) 11/05/2023   INFLUENZA VACCINE  03/20/2024   Medicare Annual Wellness (AWV)  01/14/2025   DTaP/Tdap/Td (2 - Td or Tdap) 11/15/2026   Colonoscopy  10/21/2032   HPV VACCINES  Aged Out   Meningococcal B Vaccine  Aged Out    Health Maintenance  Health Maintenance Due  Topic Date Due   Hepatitis C Screening  Never done   Zoster Vaccines- Shingrix (1 of 2)  04/28/1970   Pneumonia Vaccine 56+ Years old (2 of 2 - PCV) 05/21/2017   COVID-19 Vaccine (9 - Pfizer risk 2024-25 season) 11/05/2023   Health Maintenance Items Addressed:patient declined health maintenance   Additional Screening:  Vision Screening: Recommended annual ophthalmology exams for early detection of glaucoma and other disorders of the eye.  Dental Screening: Recommended annual dental exams for proper oral hygiene  Community Resource Referral / Chronic Care Management: CRR required this visit?  No   CCM required this visit?  No   Plan:    I have personally reviewed and noted the following in the patient's chart:   Medical and social history Use of alcohol, tobacco or illicit drugs  Current medications and supplements including opioid prescriptions. Patient is not currently taking opioid prescriptions. Functional ability and status Nutritional status Physical activity Advanced directives List of other physicians Hospitalizations, surgeries, and ER visits in previous 12 months Vitals Screenings to include cognitive, depression, and falls Referrals and appointments  In addition, I have reviewed and discussed with patient certain preventive protocols, quality metrics, and best practice recommendations. A written personalized care plan for preventive services as well as general preventive health recommendations were provided to patient.   Freeda Jerry, [place]   01/15/2024   After Visit Summary: (MyChart) Due to this being a telephonic visit, the after visit summary with patients personalized plan was offered to patient via MyChart   Notes: Nothing significant to report at this time.

## 2024-01-15 NOTE — Patient Instructions (Signed)
 Mr. Fizer , Thank you for taking time out of your busy schedule to complete your Annual Wellness Visit with me. I enjoyed our conversation and look forward to speaking with you again next year. I, as well as your care team,  appreciate your ongoing commitment to your health goals. Please review the following plan we discussed and let me know if I can assist you in the future. Your Game plan/ To Do List    Referrals: If you haven't heard from the office you've been referred to, please reach out to them at the phone provided.   Follow up Visits: Next Medicare AWV with our clinical staff: 01/19/2025   Have you seen your provider in the last 6 months (3 months if uncontrolled diabetes)? Yes Next Office Visit with your provider: n/a  Clinician Recommendations:  Aim for 30 minutes of exercise or brisk walking, 6-8 glasses of water, and 5 servings of fruits and vegetables each day.       This is a list of the screening recommended for you and due dates:  Health Maintenance  Topic Date Due   Hepatitis C Screening  Never done   Zoster (Shingles) Vaccine (1 of 2) 04/28/1970   Pneumonia Vaccine (2 of 2 - PCV) 05/21/2017   COVID-19 Vaccine (9 - Pfizer risk 2024-25 season) 11/05/2023   Flu Shot  03/20/2024   Medicare Annual Wellness Visit  01/14/2025   DTaP/Tdap/Td vaccine (2 - Td or Tdap) 11/15/2026   Colon Cancer Screening  10/21/2032   HPV Vaccine  Aged Out   Meningitis B Vaccine  Aged Out    Advanced directives: (Declined) Advance directive discussed with you today. Even though you declined this today, please call our office should you change your mind, and we can give you the proper paperwork for you to fill out. Advance Care Planning is important because it:  [x]  Makes sure you receive the medical care that is consistent with your values, goals, and preferences  [x]  It provides guidance to your family and loved ones and reduces their decisional burden about whether or not they are making  the right decisions based on your wishes.  Follow the link provided in your after visit summary or read over the paperwork we have mailed to you to help you started getting your Advance Directives in place. If you need assistance in completing these, please reach out to us  so that we can help you!  See attachments for Preventive Care and Fall Prevention Tips.

## 2024-01-26 ENCOUNTER — Ambulatory Visit (HOSPITAL_BASED_OUTPATIENT_CLINIC_OR_DEPARTMENT_OTHER)
Admission: RE | Admit: 2024-01-26 | Discharge: 2024-01-26 | Disposition: A | Discharge status: Home / Self Care | Source: Ambulatory Visit | Attending: Neurosurgery | Admitting: Neurosurgery

## 2024-01-26 ENCOUNTER — Other Ambulatory Visit: Payer: Self-pay | Admitting: Cardiology

## 2024-01-26 DIAGNOSIS — M4316 Spondylolisthesis, lumbar region: Secondary | ICD-10-CM | POA: Diagnosis present

## 2024-01-27 ENCOUNTER — Other Ambulatory Visit: Payer: Self-pay

## 2024-01-27 NOTE — Therapy (Signed)
 OUTPATIENT PHYSICAL THERAPY THORACOLUMBAR EVALUATION   Patient Name: Erik Salazar MRN: [id number] DOB:1951/07/21, 73 y.o., male Today's Date: 02/03/2024  END OF SESSION:  PT End of Session - 02/03/24 0847     Visit Number 1    Date for PT Re-Evaluation 03/30/24    Authorization Type [employer]    PT Start Time [phone]    PT Stop Time 0932    PT Time Calculation (min) 45 min    Activity Tolerance Patient tolerated treatment well;Patient limited by pain    Behavior During Therapy [facility] for tasks assessed/performed          Past Medical History:  Diagnosis Date   Allergy see attached info   Arthritis    RA and OA   Blood transfusion without reported diagnosis    with 2nd heart surgery   Carpal tunnel syndrome on right 07/08/2017   Cervical radiculopathy at C6 11/14/2017   Right   Complication of anesthesia    Coronary artery disease    Enlarged prostate    Fibromyalgia    H/O: knee surgery    15    Hayfever    WHEN YOUNG   History of blood clots    History of kidney stones    History of open heart surgery    ASCENDING AORTIC ANEURYSM   Ischemic stroke of frontal lobe (HCC) 03/14/2017   Bilateral; post-redo CT surgery   Pneumonia    PONV (postoperative nausea and vomiting)    only after CABG surgeries   Seizure disorder (HCC) 03/14/2017   Seizures (HCC)    last one 02/2021,updated 09/25/22   Status post knee surgery    DVT POST KNEE SURGERY   Stroke [facility])    Past Surgical History:  Procedure Laterality Date   ANTERIOR CERVICAL DECOMP/DISCECTOMY FUSION N/A 05/04/2020   Procedure: Anterior Cervical Decompression/Discectomy Fuion Cervical three-four, Cervical four-five, Cervical five-six;  Surgeon: Gearl Keens, MD;  Location: [facility] OR;  Service: Neurosurgery;  Laterality: N/A;   BACK SURGERY     lower back 06/28/2021   BALLOON DILATION N/A 06/23/2019   Procedure: BALLOON DILATION;  Surgeon: Felecia Hopper, MD;  Location: [facility];  Service: Gastroenterology;   Laterality: N/A;   BENTALL PROCEDURE  01/04/2016   Bentall with 23 mm pericardial AVR; SVG-LAD, SVG-CX [facility], [place])   BICEPS TENDON REPAIR Right    BIOPSY  06/23/2019   Procedure: BIOPSY;  Surgeon: Felecia Hopper, MD;  Location: [facility];  Service: Gastroenterology;;   CARDIAC SURGERY     ANUERSYM MAY 2017   Bentall procedure. Bioprosthetic aortic valve #23 mm bovine model #2700 TF ask, and 28 mm Gelweave woven vascular sinus of Valsalva graft   CARDIAC VALVE REPLACEMENT  02/2016, 02/2017   CARPAL TUNNEL RELEASE  08/2018   right hand    COLONOSCOPY WITH PROPOFOL  N/A 10/22/2022   Procedure: COLONOSCOPY WITH PROPOFOL ;  Surgeon: Annis Kinder, DO;  Location: [facility];  Service: Gastroenterology;  Laterality: N/A;   CORONARY ARTERY BYPASS GRAFT  01/04/2016   VG to LAD & VG to LCX   CORONARY ARTERY BYPASS GRAFT  03/13/2017   LIMA to LAD with steril abcess removal from dacron graft   CYSTOSCOPY/URETEROSCOPY/HOLMIUM LASER/STENT PLACEMENT Left 06/12/2022   Procedure: CYSTOSCOPY/URETEROSCOPY/HOLMIUM LASER/STENT PLACEMENT;  Surgeon: Samson Croak, MD;  Location: [facility] ORS;  Service: Urology;  Laterality: Left;   ESOPHAGOGASTRODUODENOSCOPY     Had done dilatation done about 2 or 3 times before in [place]   ESOPHAGOGASTRODUODENOSCOPY (EGD)  WITH PROPOFOL  N/A 06/23/2019   Procedure: ESOPHAGOGASTRODUODENOSCOPY (EGD) WITH PROPOFOL ;  Surgeon: Felecia Hopper, MD;  Location: [facility];  Service: Gastroenterology;  Laterality: N/A;   FALSE ANEURYSM REPAIR  03/13/2017   redo sternotomy, sterile abscess removal from Dacron graft, omental flap around aorta, CABG: LIMA-LAD ([name], Dr. Gevena Kugel)   GREATER OMENTAL FLAP CLOSURE  2018   Of pericardium 2018   INSERTION OF MESH  10/14/2020   Procedure: INSERTION OF MESH;  Surgeon: Candyce Champagne, MD;  Location: [facility] OR;  Service: General;;   JOINT REPLACEMENT  knee   knee surgeries     13 surgeries on knee done before knee  replacement    KNEE SURGERY  1983   LAPAROSCOPIC LYSIS OF ADHESIONS N/A 05/19/2021   Procedure: LYSIS OF ADHESIONS;  Surgeon: Candyce Champagne, MD;  Location: [facility] OR;  Service: General;  Laterality: N/A;  GEN & LOCAL   LYSIS OF ADHESION N/A 10/14/2020   Procedure: LYSIS OF ADHESION;  Surgeon: Candyce Champagne, MD;  Location: [facility] OR;  Service: General;  Laterality: N/A;   open heart surgery     03-13-2017   REPLACEMENT TOTAL KNEE Left 2015   ROTATOR CUFF REPAIR Right    done with biceps tendon repair   SPINE SURGERY  upper neck, lower spine   TONSILLECTOMY     removed as a child.   TOTAL KNEE ARTHROPLASTY Right 01/18/2022   Procedure: RIGHT TOTAL KNEE ARTHROPLASTY;  Surgeon: Wes Hamman, MD;  Location: [facility] OR;  Service: Orthopedics;  Laterality: Right;   VENTRAL HERNIA REPAIR N/A 10/14/2020   Procedure: LAPAROSCOPIC VENTRAL HERNIA REPAIR WITH TAP BLOCK BILATERAL;  Surgeon: Candyce Champagne, MD;  Location: [facility] OR;  Service: General;  Laterality: N/A;   VENTRAL HERNIA REPAIR N/A 05/19/2021   Procedure: LAPAROSCOPIC VENTRAL WALL HERNIA REPAIR;  Surgeon: Candyce Champagne, MD;  Location: [facility] OR;  Service: General;  Laterality: N/A;   Patient Active Problem List   Diagnosis Date Noted   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   Constipation 10/22/2022   Hypertrophied anal papilla 10/22/2022   Colon cancer screening 10/22/2022   Postop check 03/26/2022   Gait abnormality 01/29/2022   Status post total right knee replacement 01/18/2022   Coronary artery disease involving native coronary artery of native heart without angina pectoris 01/04/2022   Preop cardiovascular exam 01/04/2022   Primary osteoarthritis of right knee 01/04/2022   Acute right-sided low back pain without sciatica 12/25/2021   Aortic atherosclerosis (HCC) 09/04/2021   Spondylolisthesis at L4-L5 level 06/28/2021   S/P repair of ventral hernia 05/19/2021   S/P repair of recurrent ventral hernia 05/19/2021   Benign  prostatic hyperplasia without lower urinary tract symptoms 10/14/2020   Chronic pain 10/14/2020   ED (erectile dysfunction) of organic origin 10/14/2020   Esophageal dysphagia 10/14/2020   Gastroesophageal reflux disease 10/14/2020   History of aortic valve replacement with bioprosthetic valve 10/14/2020   Hx of aortic aneurysm repair 10/14/2020   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   Thoracic aortic aneurysm without rupture (HCC) 10/14/2020   Recurrent incisional hernia 10/14/2020   Spinal stenosis in cervical region 05/04/2020   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   Cervical radiculopathy at C6 11/14/2017   Carpal tunnel syndrome on right 07/08/2017   Tremor, essential 07/08/2017   Ischemic stroke of frontal lobe (HCC) 04/05/2017   Pain in right hand 04/05/2017   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 03/27/2017   Seizure (HCC) 03/14/2017   Presence of aortocoronary bypass  graft 03/13/2017   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HCC) 03/12/2017   Essential hypertension 02/26/2017   Mixed hyperlipidemia 02/26/2017    PCP: Adra Alanis, FNP   REFERRING PROVIDER: Gearl Keens, MD   REFERRING DIAG: [phone] (ICD-10-CM) - Spondylolisthesis, lumbar region   THERAPY DIAG:  Other low back pain  Muscle spasm of back  Muscle weakness (generalized)  RATIONALE FOR EVALUATION AND TREATMENT: Rehabilitation  ONSET DATE: Acute on chronic x ~2 months  NEXT MD VISIT: 02/06/2024   SUBJECTIVE:                                                                                                                                                                                                         SUBJECTIVE STATEMENT: Pt reports h/o neck and lower back surgery.  Over the past 2 months, he has been having progressively worsening midline lumbar/low back pain but denies sciatica or LE radicular pain. Also having pain in R lateral foot which changes how he moves.  PAIN: Are you having pain? Yes: NPRS  scale: 7/10 currently, up to 10/10  Pain location: Midline lumbar spine  Pain description: sharp  Aggravating factors: almost everything  Relieving factors: not doing anything, elevating his legs while sitting   PERTINENT HISTORY:  History chronic back pain, multiple back surgeries, ACDF, B TKA, RA and OA, DVT, CVA frontal lobe with memory deficits, seizures, open heart surgery to repair aneurism, CABG x 2, omental flap graft to mediastinum, hernia repair.   PRECAUTIONS: None  RED FLAGS: None  WEIGHT BEARING RESTRICTIONS: No  FALLS:  Has patient fallen in last 6 months? No  LIVING ENVIRONMENT:  Lives with: lives with their spouse Lives in: House/apartment Stairs: No Has following equipment at home: Single point cane, Walker - 2 wheeled, and bed side commode  OCCUPATION: Retired  PLOF: Independent and Leisure: exercises frequently - walk treadmill    PATIENT GOALS: Take away the pain.   OBJECTIVE: (objective measures completed at initial evaluation unless otherwise dated)  DIAGNOSTIC FINDINGS:  01/26/24 - MR Lumbar Spine: Results still pending as of 02/03/2024   04/08/21 - MR Lumbar Spine:  IMPRESSION: No change is appreciated compared to the study of last year.   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 Pseudo disc herniation. Bilateral foraminal stenosis could possibly affect either or both exiting L5 nerves.   L3-4: Mild multifactorial stenosis secondary to bulging of the disc and mild facet and ligamentous prominence. No visible neural compression.   Other levels show disc bulges and facet degeneration but no apparent neural  compressive stenosis.  PATIENT SURVEYS:  Modified Oswestry 25 / 50 = 50.0 %, severe disability   SCREENING FOR RED FLAGS: Bowel or bladder incontinence: No Spinal tumors: No Cauda equina syndrome: No Compression fracture: No Abdominal aneurysm: No  COGNITION:  Overall cognitive status: Impaired -  STM   SENSATION: WFL Numbness in hands from prior CVA  POSTURE:  decreased lumbar lordosis  PALPATION: TTP midline lumbar spine and over prior surgical incision. Increased muscle tension in B lumbar paraspinals but denies TTP.  LUMBAR ROM:   Active  Eval - p! w/ all motions  Flexion Hands to knees  Extension 60% limited  Right lateral flexion Hand to lateral knee  Left lateral flexion Hand to lateral knee  Right rotation 40% limited  Left rotation 40% limited  (Blank rows = not tested)  MUSCLE LENGTH: Hamstrings: mod tight R, mild tight L ITB: mod tight R, mild tight L Piriformis: mod/severe tight B Hip flexors: mod tight B Quads: mod tight B Heelcord:   LOWER EXTREMITY ROM:    Limited d/t h/o TKA and low back pain   LOWER EXTREMITY MMT:    MMT Right eval Left eval  Hip flexion 4 4+  Hip extension 4- 4  Hip abduction 4 4+  Hip adduction 4+ 4+  Hip internal rotation 4- p! Lateral knee 4+  Hip external rotation 4- p! Lateral knee 4  Knee flexion 4+ 5  Knee extension 4+ 5  Ankle dorsiflexion 3+ 3+  Ankle plantarflexion    Ankle inversion    Ankle eversion     (Blank rows = not tested)  LUMBAR SPECIAL TESTS:  Straight leg raise test: Positive    TODAY'S TREATMENT:   02/03/2024 - Eval SELF CARE:  Reviewed eval findings and role of PT in addressing identified deficits as well as instruction in initial HEP (see below).   THERAPEUTIC EXERCISE: To improve flexibility.  Demonstration, verbal and tactile cues throughout for technique.  Supine and hooklying SKTC stretches x 15 each - deferred due to increased LBP R>L Hooklying KTOS piriformis stretch x 15 - deferred due to increased LBP R>L Hooklying LTR 2 x 10 - deferred due to increased LBP R>L Hooklying HS stretch with strap x 30 bil Supine ITB stretch with strap x 30 bil   PATIENT EDUCATION:  Education details: PT eval findings, anticipated POC, initial HEP, and fee schedule for TPDN and lack of  insurance coverage requiring payment at time of service  Person educated: Patient Education method: Explanation, Demonstration, Verbal cues, and Handouts Education comprehension: verbalized understanding, returned demonstration, verbal cues required, and needs further education  HOME EXERCISE PROGRAM: Access Code: [email or web address] URL: [email or web address] Date: 02/03/2024 Prepared by: Felecia Hopper  Exercises - Supine Hamstring Stretch with Strap  - 2 x daily - 7 x weekly - 3 reps - 30 sec hold - Supine Iliotibial Band Stretch with Strap  - 2 x daily - 7 x weekly - 3 reps - 30 sec hold   ASSESSMENT:  CLINICAL IMPRESSION: Erik Salazar is a 73 y.o. male who was referred to physical therapy for evaluation and treatment for acute on chronic LBP secondary to lumbar spondylolisthesis.  Patient reports onset of current midline low back pain beginning ~2 months ago without known MOI.  He is unable to isolate aggravating or relieving factors.  Patient has deficits in lumbar ROM, proximal LE flexibility, B LE strength, abnormal posture, and TTP with abnormal muscle tension  which are interfering with ADLs and are impacting quality  of life.  On Modified Oswestry patient scored 25/50 demonstrating 50% or severe disability.  Kyle will benefit from skilled PT to address above deficits to improve mobility and activity tolerance with decreased pain interference.  Attempt at initial HEP creation limited by increased pain.  PT referral included orders for TPDN - patient made aware of recent change in rehab policy requiring OOP cash pay for TPDN at time of service in order to remain in regulatory and legal compliance with billing.  OBJECTIVE IMPAIRMENTS: decreased activity tolerance, decreased endurance, decreased knowledge of condition, decreased mobility, difficulty walking, decreased ROM, decreased strength, increased fascial restrictions, impaired perceived functional ability, increased muscle  spasms, impaired flexibility, improper body mechanics, postural dysfunction, and pain.   ACTIVITY LIMITATIONS: carrying, lifting, bending, sitting, standing, squatting, sleeping, stairs, transfers, bed mobility, locomotion level, and caring for others  PARTICIPATION LIMITATIONS: meal prep, cleaning, laundry, driving, shopping, community activity, and yard work  PERSONAL FACTORS: Age, Fitness, Past/current experiences, Time since onset of injury/illness/exacerbation, and 3+ comorbidities: History chronic back pain, multiple back surgeries, ACDF, B TKA, RA and OA, DVT, CVA frontal lobe with memory deficits, seizures, open heart surgery to repair aneurism, CABG x 2, omental flap graft to mediastinum, hernia repair.  are also affecting patient's functional outcome.   REHAB POTENTIAL: Good  CLINICAL DECISION MAKING: Evolving/moderate complexity  EVALUATION COMPLEXITY: Moderate   GOALS: Goals reviewed with patient? Yes  SHORT TERM GOALS: Target date: 03/02/2024  Patient will be independent with initial HEP to improve outcomes and carryover.  Baseline: Partial initial HEP provided on eval but limited by pain Goal status: INITIAL  2.  Patient will report 25% improvement in low back pain to improve QOL. Baseline: 7/10 on eval, up to 10/10 at worst Goal status: INITIAL  LONG TERM GOALS: Target date: 03/30/2024  Patient will be independent with ongoing/advanced HEP for self-management at home.  Baseline:  Goal status: INITIAL  2.  Patient will report 50-75% improvement in low back pain to improve QOL.  Baseline: 7/10 on eval, up to 10/10 at worst Goal status: INITIAL  3.  Patient to demonstrate ability to achieve and maintain good spinal alignment/posturing and body mechanics needed for daily activities. Baseline:  Goal status: INITIAL  4.  Patient will demonstrate functional pain free lumbar ROM to perform ADLs.   Baseline: Refer to above lumbar ROM table Goal status: INITIAL  5.   Patient will demonstrate improved B LE strength to >/= 4+/5 for improved stability and ease of mobility. Baseline: Refer to above LE MMT table Goal status: INITIAL  6. Patient will report </= 38% on Modified Oswestry (MCID = 12%) to demonstrate improved functional ability with decreased pain interference. Baseline: 25 / 50 = 50.0 % Goal status: INITIAL  7.  Patient will tolerate 20-30 min of sitting or standing w/o increased pain to allow for  improved mobility and activity tolerance. Baseline: Per modified Oswestry pain limits sitting >1/2-hour and standing >10 minutes Goal status: INITIAL   PLAN:  PT FREQUENCY: 2x/week  PT DURATION: 6-8 weeks  PLANNED INTERVENTIONS: [id number]- PT Re-evaluation, 97750- Physical Performance Testing, 97110-Therapeutic exercises, 97530- Therapeutic activity, [id number]- Neuromuscular re-education, 97535- Self Care, 16109- Manual therapy, [id number]- Gait training, [phone]- Aquatic Therapy, [phone]- Electrical stimulation (unattended), 97035- Ultrasound, 91478 (1-2 muscles), [id number] (3+ muscles)- Dry Needling, Patient/Family education, Balance training, Taping, Joint mobilization, Spinal mobilization, Cryotherapy, and Moist heat  PLAN FOR NEXT SESSION: Review and expand upon initial HEP for lumbopelvic flexibility and strengthening; MT +/- TPDN to address abnormal  muscle tension in lumbar paraspinals (patient aware of fee schedule for TPDN and seemed disinclined to pay OOP as of eval)   Francisco Irving, PT 02/03/2024, 6:10 PM

## 2024-01-28 ENCOUNTER — Other Ambulatory Visit (HOSPITAL_BASED_OUTPATIENT_CLINIC_OR_DEPARTMENT_OTHER): Payer: Self-pay

## 2024-01-28 MED ORDER — METOPROLOL SUCCINATE ER 25 MG PO TB24
12.5000 mg | ORAL_TABLET | Freq: Every day | ORAL | 0 refills | Status: DC
  Filled 2024-01-28: qty 45, 90d supply, fill #0

## 2024-01-31 ENCOUNTER — Other Ambulatory Visit (HOSPITAL_BASED_OUTPATIENT_CLINIC_OR_DEPARTMENT_OTHER): Payer: Self-pay

## 2024-02-03 ENCOUNTER — Ambulatory Visit: Attending: Neurosurgery | Admitting: Physical Therapy

## 2024-02-03 ENCOUNTER — Other Ambulatory Visit: Payer: Self-pay

## 2024-02-03 ENCOUNTER — Encounter: Payer: Self-pay | Admitting: Physical Therapy

## 2024-02-03 DIAGNOSIS — M6281 Muscle weakness (generalized): Secondary | ICD-10-CM | POA: Insufficient documentation

## 2024-02-03 DIAGNOSIS — M25661 Stiffness of right knee, not elsewhere classified: Secondary | ICD-10-CM | POA: Diagnosis not present

## 2024-02-03 DIAGNOSIS — M62830 Muscle spasm of back: Secondary | ICD-10-CM | POA: Diagnosis present

## 2024-02-03 DIAGNOSIS — G8929 Other chronic pain: Secondary | ICD-10-CM | POA: Insufficient documentation

## 2024-02-03 DIAGNOSIS — R262 Difficulty in walking, not elsewhere classified: Secondary | ICD-10-CM | POA: Diagnosis not present

## 2024-02-03 DIAGNOSIS — R252 Cramp and spasm: Secondary | ICD-10-CM | POA: Diagnosis not present

## 2024-02-03 DIAGNOSIS — M5442 Lumbago with sciatica, left side: Secondary | ICD-10-CM | POA: Insufficient documentation

## 2024-02-03 DIAGNOSIS — M25561 Pain in right knee: Secondary | ICD-10-CM | POA: Diagnosis not present

## 2024-02-03 DIAGNOSIS — M5459 Other low back pain: Secondary | ICD-10-CM | POA: Insufficient documentation

## 2024-02-03 DIAGNOSIS — R600 Localized edema: Secondary | ICD-10-CM | POA: Insufficient documentation

## 2024-02-10 ENCOUNTER — Ambulatory Visit

## 2024-02-10 DIAGNOSIS — M6281 Muscle weakness (generalized): Secondary | ICD-10-CM

## 2024-02-10 DIAGNOSIS — M62830 Muscle spasm of back: Secondary | ICD-10-CM

## 2024-02-10 DIAGNOSIS — M5459 Other low back pain: Secondary | ICD-10-CM | POA: Diagnosis not present

## 2024-02-10 NOTE — Therapy (Signed)
 OUTPATIENT PHYSICAL THERAPY THORACOLUMBAR TREATMENT   Patient Name: Erik Salazar MRN: [id number] DOB:November 30, 1950, 73 y.o., male Today's Date: 02/10/2024  END OF SESSION:  PT End of Session - 02/10/24 0941     Visit Number 2    Date for PT Re-Evaluation 03/30/24    Authorization Type [employer]    PT Start Time 0932    PT Stop Time 1014    PT Time Calculation (min) 42 min    Activity Tolerance Patient tolerated treatment well    Behavior During Therapy [facility] for tasks assessed/performed           Past Medical History:  Diagnosis Date   Allergy see attached info   Arthritis    RA and OA   Blood transfusion without reported diagnosis    with 2nd heart surgery   Carpal tunnel syndrome on right 07/08/2017   Cervical radiculopathy at C6 11/14/2017   Right   Complication of anesthesia    Coronary artery disease    Enlarged prostate    Fibromyalgia    H/O: knee surgery    15    Hayfever    WHEN YOUNG   History of blood clots    History of kidney stones    History of open heart surgery    ASCENDING AORTIC ANEURYSM   Ischemic stroke of frontal lobe (HCC) 03/14/2017   Bilateral; post-redo CT surgery   Pneumonia    PONV (postoperative nausea and vomiting)    only after CABG surgeries   Seizure disorder (HCC) 03/14/2017   Seizures (HCC)    last one 02/2021,updated 09/25/22   Status post knee surgery    DVT POST KNEE SURGERY   Stroke [facility])    Past Surgical History:  Procedure Laterality Date   ANTERIOR CERVICAL DECOMP/DISCECTOMY FUSION N/A 05/04/2020   Procedure: Anterior Cervical Decompression/Discectomy Fuion Cervical three-four, Cervical four-five, Cervical five-six;  Surgeon: Onetha Kuba, MD;  Location: [facility] OR;  Service: Neurosurgery;  Laterality: N/A;   BACK SURGERY     lower back 06/28/2021   BALLOON DILATION N/A 06/23/2019   Procedure: BALLOON DILATION;  Surgeon: Elicia Claw, MD;  Location: [facility];  Service: Gastroenterology;  Laterality: N/A;    BENTALL PROCEDURE  01/04/2016   Bentall with 23 mm pericardial AVR; SVG-LAD, SVG-CX [facility] [place])   BICEPS TENDON REPAIR Right    BIOPSY  06/23/2019   Procedure: BIOPSY;  Surgeon: Elicia Claw, MD;  Location: [facility];  Service: Gastroenterology;;   CARDIAC SURGERY     ANUERSYM MAY 2017   Bentall procedure. Bioprosthetic aortic valve #23 mm bovine model #2700 TF ask, and 28 mm Gelweave woven vascular sinus of Valsalva graft   CARDIAC VALVE REPLACEMENT  02/2016, 02/2017   CARPAL TUNNEL RELEASE  08/2018   right hand    COLONOSCOPY WITH PROPOFOL  N/A 10/22/2022   Procedure: COLONOSCOPY WITH PROPOFOL ;  Surgeon: San Sandor GAILS, DO;  Location: [facility];  Service: Gastroenterology;  Laterality: N/A;   CORONARY ARTERY BYPASS GRAFT  01/04/2016   VG to LAD & VG to LCX   CORONARY ARTERY BYPASS GRAFT  03/13/2017   LIMA to LAD with steril abcess removal from dacron graft   CYSTOSCOPY/URETEROSCOPY/HOLMIUM LASER/STENT PLACEMENT Left 06/12/2022   Procedure: CYSTOSCOPY/URETEROSCOPY/HOLMIUM LASER/STENT PLACEMENT;  Surgeon: Carolee Sherwood JONETTA DOUGLAS, MD;  Location: [facility];  Service: Urology;  Laterality: Left;   ESOPHAGOGASTRODUODENOSCOPY     Had done dilatation done about 2 or 3 times before in [place]   ESOPHAGOGASTRODUODENOSCOPY (EGD) WITH PROPOFOL   N/A 06/23/2019   Procedure: ESOPHAGOGASTRODUODENOSCOPY (EGD) WITH PROPOFOL ;  Surgeon: Elicia Claw, MD;  Location: [facility];  Service: Gastroenterology;  Laterality: N/A;   FALSE ANEURYSM REPAIR  03/13/2017   redo sternotomy, sterile abscess removal from Dacron graft, omental flap around aorta, CABG: LIMA-LAD ([name], Dr. Zachary Remington)   GREATER OMENTAL FLAP CLOSURE  2018   Of pericardium 2018   INSERTION OF MESH  10/14/2020   Procedure: INSERTION OF MESH;  Surgeon: Sheldon Standing, MD;  Location: [facility] OR;  Service: General;;   JOINT REPLACEMENT  knee   knee surgeries     13 surgeries on knee done before knee replacement    KNEE  SURGERY  1983   LAPAROSCOPIC LYSIS OF ADHESIONS N/A 05/19/2021   Procedure: LYSIS OF ADHESIONS;  Surgeon: Sheldon Standing, MD;  Location: [facility] OR;  Service: General;  Laterality: N/A;  GEN & LOCAL   LYSIS OF ADHESION N/A 10/14/2020   Procedure: LYSIS OF ADHESION;  Surgeon: Sheldon Standing, MD;  Location: [facility] OR;  Service: General;  Laterality: N/A;   open heart surgery     03-13-2017   REPLACEMENT TOTAL KNEE Left 2015   ROTATOR CUFF REPAIR Right    done with biceps tendon repair   SPINE SURGERY  upper neck, lower spine   TONSILLECTOMY     removed as a child.   TOTAL KNEE ARTHROPLASTY Right 01/18/2022   Procedure: RIGHT TOTAL KNEE ARTHROPLASTY;  Surgeon: Jerri Kay HERO, MD;  Location: [facility] OR;  Service: Orthopedics;  Laterality: Right;   VENTRAL HERNIA REPAIR N/A 10/14/2020   Procedure: LAPAROSCOPIC VENTRAL HERNIA REPAIR WITH TAP BLOCK BILATERAL;  Surgeon: Sheldon Standing, MD;  Location: [facility] OR;  Service: General;  Laterality: N/A;   VENTRAL HERNIA REPAIR N/A 05/19/2021   Procedure: LAPAROSCOPIC VENTRAL WALL HERNIA REPAIR;  Surgeon: Sheldon Standing, MD;  Location: [facility] OR;  Service: General;  Laterality: N/A;   Patient Active Problem List   Diagnosis Date Noted   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   Constipation 10/22/2022   Hypertrophied anal papilla 10/22/2022   Colon cancer screening 10/22/2022   Postop check 03/26/2022   Gait abnormality 01/29/2022   Status post total right knee replacement 01/18/2022   Coronary artery disease involving native coronary artery of native heart without angina pectoris 01/04/2022   Preop cardiovascular exam 01/04/2022   Primary osteoarthritis of right knee 01/04/2022   Acute right-sided low back pain without sciatica 12/25/2021   Aortic atherosclerosis (HCC) 09/04/2021   Spondylolisthesis at L4-L5 level 06/28/2021   S/P repair of ventral hernia 05/19/2021   S/P repair of recurrent ventral hernia 05/19/2021   Benign prostatic hyperplasia  without lower urinary tract symptoms 10/14/2020   Chronic pain 10/14/2020   ED (erectile dysfunction) of organic origin 10/14/2020   Esophageal dysphagia 10/14/2020   Gastroesophageal reflux disease 10/14/2020   History of aortic valve replacement with bioprosthetic valve 10/14/2020   Hx of aortic aneurysm repair 10/14/2020   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   Thoracic aortic aneurysm without rupture (HCC) 10/14/2020   Recurrent incisional hernia 10/14/2020   Spinal stenosis in cervical region 05/04/2020   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   Cervical radiculopathy at C6 11/14/2017   Carpal tunnel syndrome on right 07/08/2017   Tremor, essential 07/08/2017   Ischemic stroke of frontal lobe (HCC) 04/05/2017   Pain in right hand 04/05/2017   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 03/27/2017   Seizure (HCC) 03/14/2017   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/13/2017  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HCC) 03/12/2017   Essential hypertension 02/26/2017   Mixed hyperlipidemia 02/26/2017    PCP: Jason Leita Repine, FNP   REFERRING PROVIDER: Onetha Kuba, MD   REFERRING DIAG: [phone] (ICD-10-CM) - Spondylolisthesis, lumbar region   THERAPY DIAG:  Other low back pain  Muscle spasm of back  Muscle weakness (generalized)  RATIONALE FOR EVALUATION AND TREATMENT: Rehabilitation  ONSET DATE: Acute on chronic x ~2 months  NEXT MD VISIT: 02/06/2024   SUBJECTIVE:                                                                                                                                                                                                         SUBJECTIVE STATEMENT: Pt reports he has seizure earlier this morning. LBP this morning as well.  PAIN: Are you having pain? Yes: NPRS scale: 7/10 currently, up to 10/10  Pain location: Midline lumbar spine  Pain description: sharp  Aggravating factors: almost everything  Relieving factors: not doing anything, elevating  his legs while sitting   PERTINENT HISTORY:  History chronic back pain, multiple back surgeries, ACDF, B TKA, RA and OA, DVT, CVA frontal lobe with memory deficits, seizures, open heart surgery to repair aneurism, CABG x 2, omental flap graft to mediastinum, hernia repair.   PRECAUTIONS: None  RED FLAGS: None  WEIGHT BEARING RESTRICTIONS: No  FALLS:  Has patient fallen in last 6 months? No  LIVING ENVIRONMENT:  Lives with: lives with their spouse Lives in: House/apartment Stairs: No Has following equipment at home: Single point cane, Walker - 2 wheeled, and bed side commode  OCCUPATION: Retired  PLOF: Independent and Leisure: exercises frequently - walk treadmill    PATIENT GOALS: Take away the pain.   OBJECTIVE: (objective measures completed at initial evaluation unless otherwise dated)  DIAGNOSTIC FINDINGS:  01/26/24 - MR Lumbar Spine: Results still pending as of 02/03/2024   04/08/21 - MR Lumbar Spine:  IMPRESSION: No change is appreciated compared to the study of last year.   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 Pseudo disc herniation. Bilateral foraminal stenosis could possibly affect either or both exiting L5 nerves.   L3-4: Mild multifactorial stenosis secondary to bulging of the disc and mild facet and ligamentous prominence. No visible neural compression.   Other levels show disc bulges and facet degeneration but no apparent neural compressive stenosis.  PATIENT SURVEYS:  Modified Oswestry 25 / 50 = 50.0 %, severe disability   SCREENING FOR RED FLAGS: Bowel or bladder incontinence: No Spinal tumors: No Cauda equina syndrome: No  Compression fracture: No Abdominal aneurysm: No  COGNITION:  Overall cognitive status: Impaired - STM   SENSATION: WFL Numbness in hands from prior CVA  POSTURE:  decreased lumbar lordosis  PALPATION: TTP midline lumbar spine and over prior surgical incision. Increased muscle tension in B lumbar  paraspinals but denies TTP.  LUMBAR ROM:   Active  Eval - p! w/ all motions  Flexion Hands to knees  Extension 60% limited  Right lateral flexion Hand to lateral knee  Left lateral flexion Hand to lateral knee  Right rotation 40% limited  Left rotation 40% limited  (Blank rows = not tested)  MUSCLE LENGTH: Hamstrings: mod tight R, mild tight L ITB: mod tight R, mild tight L Piriformis: mod/severe tight B Hip flexors: mod tight B Quads: mod tight B Heelcord:   LOWER EXTREMITY ROM:    Limited d/t h/o TKA and low back pain   LOWER EXTREMITY MMT:    MMT Right eval Left eval  Hip flexion 4 4+  Hip extension 4- 4  Hip abduction 4 4+  Hip adduction 4+ 4+  Hip internal rotation 4- p! Lateral knee 4+  Hip external rotation 4- p! Lateral knee 4  Knee flexion 4+ 5  Knee extension 4+ 5  Ankle dorsiflexion 3+ 3+  Ankle plantarflexion    Ankle inversion    Ankle eversion     (Blank rows = not tested)  LUMBAR SPECIAL TESTS:  Straight leg raise test: Positive    TODAY'S TREATMENT:  02/10/24 THERAPEUTIC EXERCISE: To improve flexibility.  Demonstration, verbal and tactile cues throughout for technique. Nustep L4x83min UE/LE- for endurance NEUROMUSCULAR RE-EDUCATION: To improve coordination, kinesthesia, posture, and proprioception.  Seated ab sets orange pball 2x10 3 sec holds Seated pallof press RTB doubled 2x10 Seated rows RTB 2x10  Seated shoulder extension RTB 2x10 Standing heel/toe raise 10x3 B  02/03/2024 - Eval SELF CARE:  Reviewed eval findings and role of PT in addressing identified deficits as well as instruction in initial HEP (see below).   THERAPEUTIC EXERCISE: To improve flexibility.  Demonstration, verbal and tactile cues throughout for technique.  Supine and hooklying SKTC stretches x 15 each - deferred due to increased LBP R>L Hooklying KTOS piriformis stretch x 15 - deferred due to increased LBP R>L Hooklying LTR 2 x 10 - deferred due to increased LBP  R>L Hooklying HS stretch with strap x 30 bil Supine ITB stretch with strap x 30 bil   PATIENT EDUCATION:  Education details: PT eval findings, anticipated POC, initial HEP, and fee schedule for TPDN and lack of insurance coverage requiring payment at time of service  Person educated: Patient Education method: Explanation, Demonstration, Verbal cues, and Handouts Education comprehension: verbalized understanding, returned demonstration, verbal cues required, and needs further education  HOME EXERCISE PROGRAM: Access Code: [email or web address] URL: [email or web address] Date: 02/10/2024 Prepared by: Sol Gaskins  Exercises - Supine Hamstring Stretch with Strap  - 2 x daily - 7 x weekly - 3 reps - 30 sec hold - Supine Iliotibial Band Stretch with Strap  - 2 x daily - 7 x weekly - 3 reps - 30 sec hold - Standing Shoulder Row with Anchored Resistance  - 1 x daily - 7 x weekly - 3 sets - 10 reps - Shoulder extension with resistance - Neutral  - 1 x daily - 7 x weekly - 3 sets - 10 reps - Standing Anti-Rotation Press with Anchored Resistance  - 1 x daily - 7 x weekly - 3 sets -  10 reps   ASSESSMENT:  CLINICAL IMPRESSION: Focused primarily on neutral spine sore strengthening for initial treatment to avoid increasing spinal torque. Pt with good tolerance of exercises. He did report having a seizure earlier this morning as well.  Eval: Erik Salazar is a 73 y.o. male who was referred to physical therapy for evaluation and treatment for acute on chronic LBP secondary to lumbar spondylolisthesis.  Patient reports onset of current midline low back pain beginning ~2 months ago without known MOI.  He is unable to isolate aggravating or relieving factors.  Patient has deficits in lumbar ROM, proximal LE flexibility, B LE strength, abnormal posture, and TTP with abnormal muscle tension  which are interfering with ADLs and are impacting quality of life.  On Modified Oswestry patient scored 25/50  demonstrating 50% or severe disability.  Blayde will benefit from skilled PT to address above deficits to improve mobility and activity tolerance with decreased pain interference.  Attempt at initial HEP creation limited by increased pain.  PT referral included orders for TPDN - patient made aware of recent change in rehab policy requiring OOP cash pay for TPDN at time of service in order to remain in regulatory and legal compliance with billing.  OBJECTIVE IMPAIRMENTS: decreased activity tolerance, decreased endurance, decreased knowledge of condition, decreased mobility, difficulty walking, decreased ROM, decreased strength, increased fascial restrictions, impaired perceived functional ability, increased muscle spasms, impaired flexibility, improper body mechanics, postural dysfunction, and pain.   ACTIVITY LIMITATIONS: carrying, lifting, bending, sitting, standing, squatting, sleeping, stairs, transfers, bed mobility, locomotion level, and caring for others  PARTICIPATION LIMITATIONS: meal prep, cleaning, laundry, driving, shopping, community activity, and yard work  PERSONAL FACTORS: Age, Fitness, Past/current experiences, Time since onset of injury/illness/exacerbation, and 3+ comorbidities: History chronic back pain, multiple back surgeries, ACDF, B TKA, RA and OA, DVT, CVA frontal lobe with memory deficits, seizures, open heart surgery to repair aneurism, CABG x 2, omental flap graft to mediastinum, hernia repair.  are also affecting patient's functional outcome.   REHAB POTENTIAL: Good  CLINICAL DECISION MAKING: Evolving/moderate complexity  EVALUATION COMPLEXITY: Moderate   GOALS: Goals reviewed with patient? Yes  SHORT TERM GOALS: Target date: 03/02/2024  Patient will be independent with initial HEP to improve outcomes and carryover.  Baseline: Partial initial HEP provided on eval but limited by pain Goal status: INITIAL  2.  Patient will report 25% improvement in low back pain to  improve QOL. Baseline: 7/10 on eval, up to 10/10 at worst Goal status: INITIAL  LONG TERM GOALS: Target date: 03/30/2024  Patient will be independent with ongoing/advanced HEP for self-management at home.  Baseline:  Goal status: INITIAL  2.  Patient will report 50-75% improvement in low back pain to improve QOL.  Baseline: 7/10 on eval, up to 10/10 at worst Goal status: INITIAL  3.  Patient to demonstrate ability to achieve and maintain good spinal alignment/posturing and body mechanics needed for daily activities. Baseline:  Goal status: INITIAL  4.  Patient will demonstrate functional pain free lumbar ROM to perform ADLs.   Baseline: Refer to above lumbar ROM table Goal status: INITIAL  5.  Patient will demonstrate improved B LE strength to >/= 4+/5 for improved stability and ease of mobility. Baseline: Refer to above LE MMT table Goal status: INITIAL  6. Patient will report </= 38% on Modified Oswestry (MCID = 12%) to demonstrate improved functional ability with decreased pain interference. Baseline: 25 / 50 = 50.0 % Goal status: INITIAL  7.  Patient  will tolerate 20-30 min of sitting or standing w/o increased pain to allow for  improved mobility and activity tolerance. Baseline: Per modified Oswestry pain limits sitting >1/2-hour and standing >10 minutes Goal status: INITIAL   PLAN:  PT FREQUENCY: 2x/week  PT DURATION: 6-8 weeks  PLANNED INTERVENTIONS: 97164- PT Re-evaluation, 97750- Physical Performance Testing, 97110-Therapeutic exercises, 97530- Therapeutic activity, 97112- Neuromuscular re-education, 97535- Self Care, [id number]- Manual therapy, [phone]- Gait training, [phone]- Aquatic Therapy, [phone]- Electrical stimulation (unattended), 97035- Ultrasound, 79439 (1-2 muscles), [phone] (3+ muscles)- Dry Needling, Patient/Family education, Balance training, Taping, Joint mobilization, Spinal mobilization, Cryotherapy, and Moist heat  PLAN FOR NEXT SESSION:progress nuetral spine  exercises; MT +/- TPDN to address abnormal muscle tension in lumbar paraspinals (patient aware of fee schedule for TPDN and seemed disinclined to pay OOP as of eval)   Zniyah Midkiff L Westly Hinnant, PTA 02/10/2024, 10:14 AM

## 2024-02-12 ENCOUNTER — Ambulatory Visit

## 2024-02-12 DIAGNOSIS — M62830 Muscle spasm of back: Secondary | ICD-10-CM

## 2024-02-12 DIAGNOSIS — M6281 Muscle weakness (generalized): Secondary | ICD-10-CM

## 2024-02-12 DIAGNOSIS — M5459 Other low back pain: Secondary | ICD-10-CM

## 2024-02-12 DIAGNOSIS — G8929 Other chronic pain: Secondary | ICD-10-CM

## 2024-02-12 NOTE — Therapy (Signed)
 OUTPATIENT PHYSICAL THERAPY THORACOLUMBAR TREATMENT   Patient Name: Erik Salazar MRN: [id number] DOB:[birth year], 73 y.o., male Today's Date: 02/12/2024  END OF SESSION:  PT End of Session - 02/12/24 0941     Visit Number 3    Date for PT Re-Evaluation 03/30/24    Authorization Type [employer]    PT Start Time [phone]    PT Stop Time 1020    PT Time Calculation (min) 49 min    Activity Tolerance Patient tolerated treatment well    Behavior During Therapy [facility] for tasks assessed/performed            Past Medical History:  Diagnosis Date   Allergy see attached info   Arthritis    RA and OA   Blood transfusion without reported diagnosis    with 2nd heart surgery   Carpal tunnel syndrome on right 07/08/2017   Cervical radiculopathy at C6 11/14/2017   Right   Complication of anesthesia    Coronary artery disease    Enlarged prostate    Fibromyalgia    H/O: knee surgery    15    Hayfever    WHEN YOUNG   History of blood clots    History of kidney stones    History of open heart surgery    ASCENDING AORTIC ANEURYSM   Ischemic stroke of frontal lobe (HCC) 03/14/2017   Bilateral; post-redo CT surgery   Pneumonia    PONV (postoperative nausea and vomiting)    only after CABG surgeries   Seizure disorder (HCC) 03/14/2017   Seizures (HCC)    last one 02/2021,updated 09/25/22   Status post knee surgery    DVT POST KNEE SURGERY   Stroke [facility])    Past Surgical History:  Procedure Laterality Date   ANTERIOR CERVICAL DECOMP/DISCECTOMY FUSION N/A 05/04/2020   Procedure: Anterior Cervical Decompression/Discectomy Fuion Cervical three-four, Cervical four-five, Cervical five-six;  Surgeon: Onetha Kuba, MD;  Location: [facility] OR;  Service: Neurosurgery;  Laterality: N/A;   BACK SURGERY     lower back 06/28/2021   BALLOON DILATION N/A 06/23/2019   Procedure: BALLOON DILATION;  Surgeon: Elicia Claw, MD;  Location: [facility];  Service: Gastroenterology;  Laterality: N/A;    BENTALL PROCEDURE  01/04/2016   Bentall with 23 mm pericardial AVR; SVG-LAD, SVG-CX [facility] [place])   BICEPS TENDON REPAIR Right    BIOPSY  06/23/2019   Procedure: BIOPSY;  Surgeon: Elicia Claw, MD;  Location: [facility];  Service: Gastroenterology;;   CARDIAC SURGERY     ANUERSYM MAY 2017   Bentall procedure. Bioprosthetic aortic valve #23 mm bovine model #2700 TF ask, and 28 mm Gelweave woven vascular sinus of Valsalva graft   CARDIAC VALVE REPLACEMENT  02/2016, 02/2017   CARPAL TUNNEL RELEASE  08/2018   right hand    COLONOSCOPY WITH PROPOFOL  N/A 10/22/2022   Procedure: COLONOSCOPY WITH PROPOFOL ;  Surgeon: San Sandor GAILS, DO;  Location: [facility];  Service: Gastroenterology;  Laterality: N/A;   CORONARY ARTERY BYPASS GRAFT  01/04/2016   VG to LAD & VG to LCX   CORONARY ARTERY BYPASS GRAFT  03/13/2017   LIMA to LAD with steril abcess removal from dacron graft   CYSTOSCOPY/URETEROSCOPY/HOLMIUM LASER/STENT PLACEMENT Left 06/12/2022   Procedure: CYSTOSCOPY/URETEROSCOPY/HOLMIUM LASER/STENT PLACEMENT;  Surgeon: Carolee Sherwood JONETTA DOUGLAS, MD;  Location: [facility];  Service: Urology;  Laterality: Left;   ESOPHAGOGASTRODUODENOSCOPY     Had done dilatation done about 2 or 3 times before in [place]   ESOPHAGOGASTRODUODENOSCOPY (EGD) WITH  PROPOFOL  N/A 06/23/2019   Procedure: ESOPHAGOGASTRODUODENOSCOPY (EGD) WITH PROPOFOL ;  Surgeon: Elicia Claw, MD;  Location: [facility];  Service: Gastroenterology;  Laterality: N/A;   FALSE ANEURYSM REPAIR  03/13/2017   redo sternotomy, sterile abscess removal from Dacron graft, omental flap around aorta, CABG: LIMA-LAD ([name], Dr. Zachary Remington)   GREATER OMENTAL FLAP CLOSURE  2018   Of pericardium 2018   INSERTION OF MESH  10/14/2020   Procedure: INSERTION OF MESH;  Surgeon: Sheldon Standing, MD;  Location: [facility] OR;  Service: General;;   JOINT REPLACEMENT  knee   knee surgeries     13 surgeries on knee done before knee replacement    KNEE  SURGERY  1983   LAPAROSCOPIC LYSIS OF ADHESIONS N/A 05/19/2021   Procedure: LYSIS OF ADHESIONS;  Surgeon: Sheldon Standing, MD;  Location: [facility] OR;  Service: General;  Laterality: N/A;  GEN & LOCAL   LYSIS OF ADHESION N/A 10/14/2020   Procedure: LYSIS OF ADHESION;  Surgeon: Sheldon Standing, MD;  Location: [facility] OR;  Service: General;  Laterality: N/A;   open heart surgery     03-13-2017   REPLACEMENT TOTAL KNEE Left 2015   ROTATOR CUFF REPAIR Right    done with biceps tendon repair   SPINE SURGERY  upper neck, lower spine   TONSILLECTOMY     removed as a child.   TOTAL KNEE ARTHROPLASTY Right 01/18/2022   Procedure: RIGHT TOTAL KNEE ARTHROPLASTY;  Surgeon: Jerri Kay HERO, MD;  Location: [facility] OR;  Service: Orthopedics;  Laterality: Right;   VENTRAL HERNIA REPAIR N/A 10/14/2020   Procedure: LAPAROSCOPIC VENTRAL HERNIA REPAIR WITH TAP BLOCK BILATERAL;  Surgeon: Sheldon Standing, MD;  Location: [facility] OR;  Service: General;  Laterality: N/A;   VENTRAL HERNIA REPAIR N/A 05/19/2021   Procedure: LAPAROSCOPIC VENTRAL WALL HERNIA REPAIR;  Surgeon: Sheldon Standing, MD;  Location: [facility] OR;  Service: General;  Laterality: N/A;   Patient Active Problem List   Diagnosis Date Noted   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   Constipation 10/22/2022   Hypertrophied anal papilla 10/22/2022   Colon cancer screening 10/22/2022   Postop check 03/26/2022   Gait abnormality 01/29/2022   Status post total right knee replacement 01/18/2022   Coronary artery disease involving native coronary artery of native heart without angina pectoris 01/04/2022   Preop cardiovascular exam 01/04/2022   Primary osteoarthritis of right knee 01/04/2022   Acute right-sided low back pain without sciatica 12/25/2021   Aortic atherosclerosis (HCC) 09/04/2021   Spondylolisthesis at L4-L5 level 06/28/2021   S/P repair of ventral hernia 05/19/2021   S/P repair of recurrent ventral hernia 05/19/2021   Benign prostatic hyperplasia  without lower urinary tract symptoms 10/14/2020   Chronic pain 10/14/2020   ED (erectile dysfunction) of organic origin 10/14/2020   Esophageal dysphagia 10/14/2020   Gastroesophageal reflux disease 10/14/2020   History of aortic valve replacement with bioprosthetic valve 10/14/2020   Hx of aortic aneurysm repair 10/14/2020   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   Thoracic aortic aneurysm without rupture (HCC) 10/14/2020   Recurrent incisional hernia 10/14/2020   Spinal stenosis in cervical region 05/04/2020   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   Cervical radiculopathy at C6 11/14/2017   Carpal tunnel syndrome on right 07/08/2017   Tremor, essential 07/08/2017   Ischemic stroke of frontal lobe (HCC) 04/05/2017   Pain in right hand 04/05/2017   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 03/27/2017   Seizure (HCC) 03/14/2017   Presence of aortocoronary bypass graft  03/13/2017   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HCC) 03/12/2017   Essential hypertension 02/26/2017   Mixed hyperlipidemia 02/26/2017    PCP: Jason Leita Repine, FNP   REFERRING PROVIDER: Onetha Kuba, MD   REFERRING DIAG: [phone] (ICD-10-CM) - Spondylolisthesis, lumbar region   THERAPY DIAG:  Other low back pain  Muscle spasm of back  Muscle weakness (generalized)  Chronic midline low back pain with left-sided sciatica  RATIONALE FOR EVALUATION AND TREATMENT: Rehabilitation  ONSET DATE: Acute on chronic x ~2 months  NEXT MD VISIT: 02/06/2024   SUBJECTIVE:                                                                                                                                                                                                         SUBJECTIVE STATEMENT: Pt reports he has seizure earlier this morning. LBP this morning as well.   PAIN: Are you having pain? Yes: NPRS scale: 7/10 currently, up to 10/10  Pain location: Midline lumbar spine  Pain description: sharp  Aggravating factors: almost  everything  Relieving factors: not doing anything, elevating his legs while sitting   PERTINENT HISTORY:  History chronic back pain, multiple back surgeries, ACDF, B TKA, RA and OA, DVT, CVA frontal lobe with memory deficits, seizures, open heart surgery to repair aneurism, CABG x 2, omental flap graft to mediastinum, hernia repair.   PRECAUTIONS: None  RED FLAGS: None  WEIGHT BEARING RESTRICTIONS: No  FALLS:  Has patient fallen in last 6 months? No  LIVING ENVIRONMENT:  Lives with: lives with their spouse Lives in: House/apartment Stairs: No Has following equipment at home: Single point cane, Walker - 2 wheeled, and bed side commode  OCCUPATION: Retired  PLOF: Independent and Leisure: exercises frequently - walk treadmill    PATIENT GOALS: Take away the pain.   OBJECTIVE: (objective measures completed at initial evaluation unless otherwise dated)  DIAGNOSTIC FINDINGS:  01/26/24 - MR Lumbar Spine: Results still pending as of 02/03/2024   04/08/21 - MR Lumbar Spine:  IMPRESSION: No change is appreciated compared to the study of last year.   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 Pseudo disc herniation. Bilateral foraminal stenosis could possibly affect either or both exiting L5 nerves.   L3-4: Mild multifactorial stenosis secondary to bulging of the disc and mild facet and ligamentous prominence. No visible neural compression.   Other levels show disc bulges and facet degeneration but no apparent neural compressive stenosis.  PATIENT SURVEYS:  Modified Oswestry 25 / 50 = 50.0 %, severe disability   SCREENING FOR RED  FLAGS: Bowel or bladder incontinence: No Spinal tumors: No Cauda equina syndrome: No Compression fracture: No Abdominal aneurysm: No  COGNITION:  Overall cognitive status: Impaired - STM   SENSATION: WFL Numbness in hands from prior CVA  POSTURE:  decreased lumbar lordosis  PALPATION: TTP midline lumbar spine and over  prior surgical incision. Increased muscle tension in B lumbar paraspinals but denies TTP.  LUMBAR ROM:   Active  Eval - p! w/ all motions  Flexion Hands to knees  Extension 60% limited  Right lateral flexion Hand to lateral knee  Left lateral flexion Hand to lateral knee  Right rotation 40% limited  Left rotation 40% limited  (Blank rows = not tested)  MUSCLE LENGTH: Hamstrings: mod tight R, mild tight L ITB: mod tight R, mild tight L Piriformis: mod/severe tight B Hip flexors: mod tight B Quads: mod tight B Heelcord:   LOWER EXTREMITY ROM:    Limited d/t h/o TKA and low back pain   LOWER EXTREMITY MMT:    MMT Right eval Left eval  Hip flexion 4 4+  Hip extension 4- 4  Hip abduction 4 4+  Hip adduction 4+ 4+  Hip internal rotation 4- p! Lateral knee 4+  Hip external rotation 4- p! Lateral knee 4  Knee flexion 4+ 5  Knee extension 4+ 5  Ankle dorsiflexion 3+ 3+  Ankle plantarflexion    Ankle inversion    Ankle eversion     (Blank rows = not tested)  LUMBAR SPECIAL TESTS:  Straight leg raise test: Positive    TODAY'S TREATMENT:  02/12/24 THERAPEUTIC EXERCISE: To improve flexibility.  Demonstration, verbal and tactile cues throughout for technique. Nustep L5x8min UE/LE- for endurance Trunk rotations in sitting x 5 B Seated flexion pball rollout 10x3; 5x Rotation B   NEUROMUSCULAR RE-EDUCATION: To improve postural awareness and core activtion Lat pull downs 15lb 3x10 Seated rows 25lb 2x10 low grips Farmer walk with 10lb kettle bell x 2 laps unilateral OHP 10lb B 2x10 Multifidus walkout RTB 5x10   02/10/24 THERAPEUTIC EXERCISE: To improve flexibility.  Demonstration, verbal and tactile cues throughout for technique. Nustep L4x79min UE/LE- for endurance NEUROMUSCULAR RE-EDUCATION: To improve coordination, kinesthesia, posture, and proprioception.  Seated ab sets orange pball 2x10 3 sec holds Seated pallof press RTB doubled 2x10 Seated rows RTB 2x10   Seated shoulder extension RTB 2x10 Standing heel/toe raise 10x3 B  02/03/2024 - Eval SELF CARE:  Reviewed eval findings and role of PT in addressing identified deficits as well as instruction in initial HEP (see below).   THERAPEUTIC EXERCISE: To improve flexibility.  Demonstration, verbal and tactile cues throughout for technique.  Supine and hooklying SKTC stretches x 15 each - deferred due to increased LBP R>L Hooklying KTOS piriformis stretch x 15 - deferred due to increased LBP R>L Hooklying LTR 2 x 10 - deferred due to increased LBP R>L Hooklying HS stretch with strap x 30 bil Supine ITB stretch with strap x 30 bil   PATIENT EDUCATION:  Education details: HEP update  Person educated: Patient Education method: Explanation, Demonstration, Verbal cues, and Handouts Education comprehension: verbalized understanding, returned demonstration, verbal cues required, and needs further education  HOME EXERCISE PROGRAM: Access Code: [email or web address] URL: [email or web address] Date: 02/10/2024 Prepared by: Santrice Muzio  Exercises - Supine Hamstring Stretch with Strap  - 2 x daily - 7 x weekly - 3 reps - 30 sec hold - Supine Iliotibial Band Stretch with Strap  - 2 x daily - 7 x weekly - 3  reps - 30 sec hold - Standing Shoulder Row with Anchored Resistance  - 1 x daily - 7 x weekly - 3 sets - 10 reps - Shoulder extension with resistance - Neutral  - 1 x daily - 7 x weekly - 3 sets - 10 reps - Standing Anti-Rotation Press with Anchored Resistance  - 1 x daily - 7 x weekly - 3 sets - 10 reps   ASSESSMENT:  CLINICAL IMPRESSION: Progressed with postural strengthening and core activation interventions, to more resemble weight training and gym routine. Pt noted a little LBP with OHP but this was addressed with seated lumbar flexion/rotation stretches w/ green pball. Pt responded well overall to treatment. He is doing well with the nuetral spine approach to treatment.  Eval:  Cassey Waddle is a 73 y.o. male who was referred to physical therapy for evaluation and treatment for acute on chronic LBP secondary to lumbar spondylolisthesis.  Patient reports onset of current midline low back pain beginning ~2 months ago without known MOI.  He is unable to isolate aggravating or relieving factors.  Patient has deficits in lumbar ROM, proximal LE flexibility, B LE strength, abnormal posture, and TTP with abnormal muscle tension  which are interfering with ADLs and are impacting quality of life.  On Modified Oswestry patient scored 25/50 demonstrating 50% or severe disability.  Tristyn will benefit from skilled PT to address above deficits to improve mobility and activity tolerance with decreased pain interference.  Attempt at initial HEP creation limited by increased pain.  PT referral included orders for TPDN - patient made aware of recent change in rehab policy requiring OOP cash pay for TPDN at time of service in order to remain in regulatory and legal compliance with billing.  OBJECTIVE IMPAIRMENTS: decreased activity tolerance, decreased endurance, decreased knowledge of condition, decreased mobility, difficulty walking, decreased ROM, decreased strength, increased fascial restrictions, impaired perceived functional ability, increased muscle spasms, impaired flexibility, improper body mechanics, postural dysfunction, and pain.  ACTIVITY LIMITATIONS: carrying, lifting, bending, sitting, standing, squatting, sleeping, stairs, transfers, bed mobility, locomotion level, and caring for others   PARTICIPATION LIMITATIONS: meal prep, cleaning, laundry, driving, shopping, community activity, and yard work  PERSONAL FACTORS: Age, Fitness, Past/current experiences, Time since onset of injury/illness/exacerbation, and 3+ comorbidities: History chronic back pain, multiple back surgeries, ACDF, B TKA, RA and OA, DVT, CVA frontal lobe with memory deficits, seizures, open heart surgery to repair  aneurism, CABG x 2, omental flap graft to mediastinum, hernia repair.  are also affecting patient's functional outcome.   REHAB POTENTIAL: Good  CLINICAL DECISION MAKING: Evolving/moderate complexity  EVALUATION COMPLEXITY: Moderate   GOALS: Goals reviewed with patient? Yes  SHORT TERM GOALS: Target date: 03/02/2024  Patient will be independent with initial HEP to improve outcomes and carryover.  Baseline: Partial initial HEP provided on eval but limited by pain Goal status: IN PROGRESS- 02/12/24 reports   2.  Patient will report 25% improvement in low back pain to improve QOL. Baseline: 7/10 on eval, up to 10/10 at worst Goal status: IN PROGRESS  LONG TERM GOALS: Target date: 03/30/2024  Patient will be independent with ongoing/advanced HEP for self-management at home.  Baseline:  Goal status: IN PROGRESS  2.  Patient will report 50-75% improvement in low back pain to improve QOL.  Baseline: 7/10 on eval, up to 10/10 at worst Goal status: IN PROGRESS  3.  Patient to demonstrate ability to achieve and maintain good spinal alignment/posturing and body mechanics needed for daily activities. Baseline:  Goal status: IN PROGRESS  4.  Patient will demonstrate functional pain free lumbar ROM to perform ADLs.   Baseline: Refer to above lumbar ROM table Goal status: IN PROGRESS  5.  Patient will demonstrate improved B LE strength to >/= 4+/5 for improved stability and ease of mobility. Baseline: Refer to above LE MMT table Goal status: IN PROGRESS  6. Patient will report </= 38% on Modified Oswestry (MCID = 12%) to demonstrate improved functional ability with decreased pain interference. Baseline: 25 / 50 = 50.0 % Goal status: IN PROGRESS  7.  Patient will tolerate 20-30 min of sitting or standing w/o increased pain to allow for  improved mobility and activity tolerance. Baseline: Per modified Oswestry pain limits sitting >1/2-hour and standing >10 minutes Goal status: IN  PROGRESS   PLAN:  PT FREQUENCY: 2x/week  PT DURATION: 6-8 weeks  PLANNED INTERVENTIONS: 97164- PT Re-evaluation, 97750- Physical Performance Testing, 97110-Therapeutic exercises, 97530- Therapeutic activity, 97112- Neuromuscular re-education, 97535- Self Care, [id number]- Manual therapy, [id number]- Gait training, [phone]- Aquatic Therapy, [phone]- Electrical stimulation (unattended), 97035- Ultrasound, 79439 (1-2 muscles), 20561 (3+ muscles)- Dry Needling, Patient/Family education, Balance training, Taping, Joint mobilization, Spinal mobilization, Cryotherapy, and Moist heat  PLAN FOR NEXT SESSION:progress nuetral spine exercises; MT +/- TPDN to address abnormal muscle tension in lumbar paraspinals (patient aware of fee schedule for TPDN and seemed disinclined to pay OOP as of eval)   Sol LITTIE Gaskins, PTA 02/12/2024, 10:36 AM

## 2024-02-17 ENCOUNTER — Ambulatory Visit: Admitting: Rehabilitation

## 2024-02-17 ENCOUNTER — Encounter: Payer: Self-pay | Admitting: Rehabilitation

## 2024-02-17 DIAGNOSIS — M5459 Other low back pain: Secondary | ICD-10-CM | POA: Diagnosis not present

## 2024-02-17 DIAGNOSIS — M62830 Muscle spasm of back: Secondary | ICD-10-CM

## 2024-02-17 DIAGNOSIS — M6281 Muscle weakness (generalized): Secondary | ICD-10-CM

## 2024-02-17 NOTE — Therapy (Signed)
 OUTPATIENT PHYSICAL THERAPY THORACOLUMBAR TREATMENT   Patient Name: Erik Salazar MRN: [id number] DOB:[birth year], 73 y.o., male Today's Date: 02/17/2024  END OF SESSION:  PT End of Session - 02/17/24 0918     Visit Number 4    Date for PT Re-Evaluation 03/30/24    Authorization Type [employer]    PT Start Time [phone]    PT Stop Time 1015    PT Time Calculation (min) 50 min    Activity Tolerance Patient tolerated treatment well    Behavior During Therapy [facility] for tasks assessed/performed            Past Medical History:  Diagnosis Date   Allergy see attached info   Arthritis    RA and OA   Blood transfusion without reported diagnosis    with 2nd heart surgery   Carpal tunnel syndrome on right 07/08/2017   Cervical radiculopathy at C6 11/14/2017   Right   Complication of anesthesia    Coronary artery disease    Enlarged prostate    Fibromyalgia    H/O: knee surgery    15    Hayfever    WHEN YOUNG   History of blood clots    History of kidney stones    History of open heart surgery    ASCENDING AORTIC ANEURYSM   Ischemic stroke of frontal lobe (HCC) 03/14/2017   Bilateral; post-redo CT surgery   Pneumonia    PONV (postoperative nausea and vomiting)    only after CABG surgeries   Seizure disorder (HCC) 03/14/2017   Seizures (HCC)    last one 02/2021,updated 09/25/22   Status post knee surgery    DVT POST KNEE SURGERY   Stroke [facility])    Past Surgical History:  Procedure Laterality Date   ANTERIOR CERVICAL DECOMP/DISCECTOMY FUSION N/A 05/04/2020   Procedure: Anterior Cervical Decompression/Discectomy Fuion Cervical three-four, Cervical four-five, Cervical five-six;  Surgeon: Onetha Kuba, MD;  Location: [facility] OR;  Service: Neurosurgery;  Laterality: N/A;   BACK SURGERY     lower back 06/28/2021   BALLOON DILATION N/A 06/23/2019   Procedure: BALLOON DILATION;  Surgeon: Elicia Claw, MD;  Location: [facility] ENDOSCOPY;  Service: Gastroenterology;  Laterality: N/A;    BENTALL PROCEDURE  01/04/2016   Bentall with 23 mm pericardial AVR; SVG-LAD, SVG-CX [facility] [place])   BICEPS TENDON REPAIR Right    BIOPSY  06/23/2019   Procedure: BIOPSY;  Surgeon: Elicia Claw, MD;  Location: [facility];  Service: Gastroenterology;;   CARDIAC SURGERY     ANUERSYM MAY 2017   Bentall procedure. Bioprosthetic aortic valve #23 mm bovine model #2700 TF ask, and 28 mm Gelweave woven vascular sinus of Valsalva graft   CARDIAC VALVE REPLACEMENT  02/2016, 02/2017   CARPAL TUNNEL RELEASE  08/2018   right hand    COLONOSCOPY WITH PROPOFOL  N/A 10/22/2022   Procedure: COLONOSCOPY WITH PROPOFOL ;  Surgeon: San Sandor GAILS, DO;  Location: [facility];  Service: Gastroenterology;  Laterality: N/A;   CORONARY ARTERY BYPASS GRAFT  01/04/2016   VG to LAD & VG to LCX   CORONARY ARTERY BYPASS GRAFT  03/13/2017   LIMA to LAD with steril abcess removal from dacron graft   CYSTOSCOPY/URETEROSCOPY/HOLMIUM LASER/STENT PLACEMENT Left 06/12/2022   Procedure: CYSTOSCOPY/URETEROSCOPY/HOLMIUM LASER/STENT PLACEMENT;  Surgeon: Carolee Sherwood JONETTA DOUGLAS, MD;  Location: [facility] ORS;  Service: Urology;  Laterality: Left;   ESOPHAGOGASTRODUODENOSCOPY     Had done dilatation done about 2 or 3 times before in [place]   ESOPHAGOGASTRODUODENOSCOPY (EGD) WITH  PROPOFOL  N/A 06/23/2019   Procedure: ESOPHAGOGASTRODUODENOSCOPY (EGD) WITH PROPOFOL ;  Surgeon: Elicia Claw, MD;  Location: [facility];  Service: Gastroenterology;  Laterality: N/A;   FALSE ANEURYSM REPAIR  03/13/2017   redo sternotomy, sterile abscess removal from Dacron graft, omental flap around aorta, CABG: LIMA-LAD ([name], Dr. Zachary Remington)   GREATER OMENTAL FLAP CLOSURE  2018   Of pericardium 2018   INSERTION OF MESH  10/14/2020   Procedure: INSERTION OF MESH;  Surgeon: Sheldon Standing, MD;  Location: [facility] OR;  Service: General;;   JOINT REPLACEMENT  knee   knee surgeries     13 surgeries on knee done before knee replacement    KNEE  SURGERY  1983   LAPAROSCOPIC LYSIS OF ADHESIONS N/A 05/19/2021   Procedure: LYSIS OF ADHESIONS;  Surgeon: Sheldon Standing, MD;  Location: [facility] OR;  Service: General;  Laterality: N/A;  GEN & LOCAL   LYSIS OF ADHESION N/A 10/14/2020   Procedure: LYSIS OF ADHESION;  Surgeon: Sheldon Standing, MD;  Location: [facility] OR;  Service: General;  Laterality: N/A;   open heart surgery     03-13-2017   REPLACEMENT TOTAL KNEE Left 2015   ROTATOR CUFF REPAIR Right    done with biceps tendon repair   SPINE SURGERY  upper neck, lower spine   TONSILLECTOMY     removed as a child.   TOTAL KNEE ARTHROPLASTY Right 01/18/2022   Procedure: RIGHT TOTAL KNEE ARTHROPLASTY;  Surgeon: Jerri Kay HERO, MD;  Location: [facility] OR;  Service: Orthopedics;  Laterality: Right;   VENTRAL HERNIA REPAIR N/A 10/14/2020   Procedure: LAPAROSCOPIC VENTRAL HERNIA REPAIR WITH TAP BLOCK BILATERAL;  Surgeon: Sheldon Standing, MD;  Location: [facility] OR;  Service: General;  Laterality: N/A;   VENTRAL HERNIA REPAIR N/A 05/19/2021   Procedure: LAPAROSCOPIC VENTRAL WALL HERNIA REPAIR;  Surgeon: Sheldon Standing, MD;  Location: [facility] OR;  Service: General;  Laterality: N/A;   Patient Active Problem List   Diagnosis Date Noted   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   Constipation 10/22/2022   Hypertrophied anal papilla 10/22/2022   Colon cancer screening 10/22/2022   Postop check 03/26/2022   Gait abnormality 01/29/2022   Status post total right knee replacement 01/18/2022   Coronary artery disease involving native coronary artery of native heart without angina pectoris 01/04/2022   Preop cardiovascular exam 01/04/2022   Primary osteoarthritis of right knee 01/04/2022   Acute right-sided low back pain without sciatica 12/25/2021   Aortic atherosclerosis (HCC) 09/04/2021   Spondylolisthesis at L4-L5 level 06/28/2021   S/P repair of ventral hernia 05/19/2021   S/P repair of recurrent ventral hernia 05/19/2021   Benign prostatic hyperplasia  without lower urinary tract symptoms 10/14/2020   Chronic pain 10/14/2020   ED (erectile dysfunction) of organic origin 10/14/2020   Esophageal dysphagia 10/14/2020   Gastroesophageal reflux disease 10/14/2020   History of aortic valve replacement with bioprosthetic valve 10/14/2020   Hx of aortic aneurysm repair 10/14/2020   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   Thoracic aortic aneurysm without rupture (HCC) 10/14/2020   Recurrent incisional hernia 10/14/2020   Spinal stenosis in cervical region 05/04/2020   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   Cervical radiculopathy at C6 11/14/2017   Carpal tunnel syndrome on right 07/08/2017   Tremor, essential 07/08/2017   Ischemic stroke of frontal lobe (HCC) 04/05/2017   Pain in right hand 04/05/2017   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 03/27/2017   Seizure (HCC) 03/14/2017   Presence of aortocoronary bypass graft  03/13/2017   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HCC) 03/12/2017   Essential hypertension 02/26/2017   Mixed hyperlipidemia 02/26/2017    PCP: Jason Leita Repine, FNP   REFERRING PROVIDER: Onetha Kuba, MD   REFERRING DIAG: [phone] (ICD-10-CM) - Spondylolisthesis, lumbar region   THERAPY DIAG:  Other low back pain  Muscle spasm of back  Muscle weakness (generalized)  RATIONALE FOR EVALUATION AND TREATMENT: Rehabilitation  ONSET DATE: Acute on chronic x ~2 months  NEXT MD VISIT: 02/06/2024   SUBJECTIVE:                                                                                                                                                                                                         SUBJECTIVE STATEMENT: Pt denies any further seizures since before last visit.   States they increased his anticonvulsants and he has been ok.  Reports chief c/o is still midline LBP.  Denies any radicular/radiation symptoms.   States pain is worse with steps  PAIN: Are you having pain? Yes: NPRS scale: 6/10 currently, up to  10/10  Pain location: Midline lumbar spine  Pain description: sharp  Aggravating factors: almost everything  Relieving factors: not doing anything, elevating his legs while sitting   PERTINENT HISTORY:  History chronic back pain, multiple back surgeries, ACDF, B TKA, RA and OA, DVT, CVA frontal lobe with memory deficits, seizures, open heart surgery to repair aneurism, CABG x 2, omental flap graft to mediastinum, hernia repair.   PRECAUTIONS: None  RED FLAGS: None  WEIGHT BEARING RESTRICTIONS: No  FALLS:  Has patient fallen in last 6 months? No  LIVING ENVIRONMENT:  Lives with: lives with their spouse Lives in: House/apartment Stairs: No Has following equipment at home: Single point cane, Walker - 2 wheeled, and bed side commode  OCCUPATION: Retired  PLOF: Independent and Leisure: exercises frequently - walk treadmill    PATIENT GOALS: Take away the pain.   OBJECTIVE: (objective measures completed at initial evaluation unless otherwise dated)  DIAGNOSTIC FINDINGS:  01/26/24 - MR Lumbar Spine: Results still pending as of 02/03/2024   04/08/21 - MR Lumbar Spine:  IMPRESSION: No change is appreciated compared to the study of last year.   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 Pseudo disc herniation. Bilateral foraminal stenosis could possibly affect either or both exiting L5 nerves.   L3-4: Mild multifactorial stenosis secondary to bulging of the disc and mild facet and ligamentous prominence. No visible neural compression.   Other levels show disc bulges and facet degeneration but no apparent neural compressive stenosis.  PATIENT SURVEYS:  Modified Oswestry 25 / 50 = 50.0 %, severe disability   SCREENING FOR RED FLAGS: Bowel or bladder incontinence: No Spinal tumors: No Cauda equina syndrome: No Compression fracture: No Abdominal aneurysm: No  COGNITION:  Overall cognitive status: Impaired - STM   SENSATION: WFL Numbness in hands from  prior CVA  POSTURE:  decreased lumbar lordosis  PALPATION: TTP midline lumbar spine and over prior surgical incision. Increased muscle tension in B lumbar paraspinals but denies TTP.  LUMBAR ROM:   Active  Eval - p! w/ all motions  Flexion Hands to knees  Extension 60% limited  Right lateral flexion Hand to lateral knee  Left lateral flexion Hand to lateral knee  Right rotation 40% limited  Left rotation 40% limited  (Blank rows = not tested)  MUSCLE LENGTH: Hamstrings: mod tight R, mild tight L ITB: mod tight R, mild tight L Piriformis: mod/severe tight B Hip flexors: mod tight B Quads: mod tight B Heelcord:   LOWER EXTREMITY ROM:    Limited d/t h/o TKA and low back pain   LOWER EXTREMITY MMT:    MMT Right eval Left eval  Hip flexion 4 4+  Hip extension 4- 4  Hip abduction 4 4+  Hip adduction 4+ 4+  Hip internal rotation 4- p! Lateral knee 4+  Hip external rotation 4- p! Lateral knee 4  Knee flexion 4+ 5  Knee extension 4+ 5  Ankle dorsiflexion 3+ 3+  Ankle plantarflexion    Ankle inversion    Ankle eversion     (Blank rows = not tested)  LUMBAR SPECIAL TESTS:  Straight leg raise test: Positive    TODAY'S TREATMENT:  02/17/2024  THERAPEUTIC EXERCISE: To improve strength and endurance.  Demonstration, verbal and tactile cues throughout for technique. Nustep L5 x 10' UE/LE for endurance  SELF CARE: Provided education to prevent loss of gains achieved with physical therapy and to prevent future decline in function. HEP review: Supine SLR stretch w/ strap x 1' x 2 BLE Supine cross over piriformis stretch x 1' x 2 RLE FABER piriformis stretch x 1' x 2 LLE  NEUROMUSCULAR RE-EDUCATION: To improve posture and proprioception. Multifidus walkouts x 10 BLE Standing RTB pulls to side with core stabilization Step-ups with Ta's Seated ball rollouts BUE multi directional x 2' Supine ball rollouts BLE x 1' w/ TA Supine ball S/S BLE x 1' w/ TA Supine bridging x  30 w/ TA Supine pelvic tilts    02/12/24 THERAPEUTIC EXERCISE: To improve flexibility.  Demonstration, verbal and tactile cues throughout for technique. Nustep L5x8min UE/LE- for endurance Trunk rotations in sitting x 5 B Seated flexion pball rollout 10x3; 5x Rotation B   NEUROMUSCULAR RE-EDUCATION: To improve postural awareness and core activtion Lat pull downs 15lb 3x10 Seated rows 25lb 2x10 low grips Farmer walk with 10lb kettle bell x 2 laps unilateral OHP 10lb B 2x10 Multifidus walkout RTB 5x10    02/10/24 THERAPEUTIC EXERCISE: To improve flexibility.  Demonstration, verbal and tactile cues throughout for technique. Nustep L4x36min UE/LE- for endurance NEUROMUSCULAR RE-EDUCATION: To improve coordination, kinesthesia, posture, and proprioception.  Seated ab sets orange pball 2x10 3 sec holds Seated pallof press RTB doubled 2x10 Seated rows RTB 2x10  Seated shoulder extension RTB 2x10 Standing heel/toe raise 10x3 B  02/03/2024 - Eval SELF CARE:  Reviewed eval findings and role of PT in addressing identified deficits as well as instruction in initial HEP (see below).   THERAPEUTIC EXERCISE: To improve flexibility.  Demonstration,  verbal and tactile cues throughout for technique.  Supine and hooklying SKTC stretches x 15 each - deferred due to increased LBP R>L Hooklying KTOS piriformis stretch x 15 - deferred due to increased LBP R>L Hooklying LTR 2 x 10 - deferred due to increased LBP R>L Hooklying HS stretch with strap x 30 bil Supine ITB stretch with strap x 30 bil   PATIENT EDUCATION:  Education details: HEP update  Person educated: Patient Education method: Explanation, Demonstration, Verbal cues, and Handouts Education comprehension: verbalized understanding, returned demonstration, verbal cues required, and needs further education  HOME EXERCISE PROGRAM: Access Code: [email or web address] URL: [email or web address] Date: 02/10/2024 Prepared by:  Sol Gaskins  Exercises - Supine Hamstring Stretch with Strap  - 2 x daily - 7 x weekly - 3 reps - 30 sec hold - Supine Iliotibial Band Stretch with Strap  - 2 x daily - 7 x weekly - 3 reps - 30 sec hold - Standing Shoulder Row with Anchored Resistance  - 1 x daily - 7 x weekly - 3 sets - 10 reps - Shoulder extension with resistance - Neutral  - 1 x daily - 7 x weekly - 3 sets - 10 reps - Standing Anti-Rotation Press with Anchored Resistance  - 1 x daily - 7 x weekly - 3 sets - 10 reps   ASSESSMENT:  CLINICAL IMPRESSION: Continuing to work on maintaining neutral core with all activities.  Manual cueing required at times for neutral spine with certain activities.  Patient is tolerating well and improving with his spine stability.  Pain is slower to improve. He can do pelvic tilting but finds no position of relief in neutral.   However, needs education to understand importance of neutral.  Does well with TA contractions.     Eval: Erik Salazar is a 72 y.o. male who was referred to physical therapy for evaluation and treatment for acute on chronic LBP secondary to lumbar spondylolisthesis.  Patient reports onset of current midline low back pain beginning ~2 months ago without known MOI.  He is unable to isolate aggravating or relieving factors.  Patient has deficits in lumbar ROM, proximal LE flexibility, B LE strength, abnormal posture, and TTP with abnormal muscle tension  which are interfering with ADLs and are impacting quality of life.  On Modified Oswestry patient scored 25/50 demonstrating 50% or severe disability.  Jkwon will benefit from skilled PT to address above deficits to improve mobility and activity tolerance with decreased pain interference.  Attempt at initial HEP creation limited by increased pain.  PT referral included orders for TPDN - patient made aware of recent change in rehab policy requiring OOP cash pay for TPDN at time of service in order to remain in regulatory and legal  compliance with billing.  OBJECTIVE IMPAIRMENTS: decreased activity tolerance, decreased endurance, decreased knowledge of condition, decreased mobility, difficulty walking, decreased ROM, decreased strength, increased fascial restrictions, impaired perceived functional ability, increased muscle spasms, impaired flexibility, improper body mechanics, postural dysfunction, and pain.  ACTIVITY LIMITATIONS: carrying, lifting, bending, sitting, standing, squatting, sleeping, stairs, transfers, bed mobility, locomotion level, and caring for others   PARTICIPATION LIMITATIONS: meal prep, cleaning, laundry, driving, shopping, community activity, and yard work  PERSONAL FACTORS: Age, Fitness, Past/current experiences, Time since onset of injury/illness/exacerbation, and 3+ comorbidities: History chronic back pain, multiple back surgeries, ACDF, B TKA, RA and OA, DVT, CVA frontal lobe with memory deficits, seizures, open heart surgery to repair aneurism, CABG x 2, omental flap graft to mediastinum, hernia repair.  are also affecting patient's functional outcome.   REHAB POTENTIAL: Good  CLINICAL DECISION MAKING: Evolving/moderate complexity  EVALUATION COMPLEXITY: Moderate   GOALS: Goals reviewed with patient? Yes  SHORT TERM GOALS: Target date: 03/02/2024  Patient will be independent with initial HEP to improve outcomes and carryover.  Baseline: Partial initial HEP provided on eval but limited by pain Goal status: IN PROGRESS- 02/12/24 reports   2.  Patient will report 25% improvement in low back pain to improve QOL. Baseline: 7/10 on eval, up to 10/10 at worst Goal status: IN PROGRESS  LONG TERM GOALS: Target date: 03/30/2024  Patient will be independent with ongoing/advanced HEP for self-management at home.  Baseline:  Goal status: IN PROGRESS  2.  Patient will report 50-75% improvement in low back pain to improve QOL.  Baseline: 7/10 on eval, up to 10/10 at worst Goal status: IN  PROGRESS  3.  Patient to demonstrate ability to achieve and maintain good spinal alignment/posturing and body mechanics needed for daily activities. Baseline:  Goal status: IN PROGRESS  4.  Patient will demonstrate functional pain free lumbar ROM to perform ADLs.   Baseline: Refer to above lumbar ROM table Goal status: IN PROGRESS  5.  Patient will demonstrate improved B LE strength to >/= 4+/5 for improved stability and ease of mobility. Baseline: Refer to above LE MMT table Goal status: IN PROGRESS  6. Patient will report </= 38% on Modified Oswestry (MCID = 12%) to demonstrate improved functional ability with decreased pain interference. Baseline: 25 / 50 = 50.0 % Goal status: IN PROGRESS  7.  Patient will tolerate 20-30 min of sitting or standing w/o increased pain to allow for  improved mobility and activity tolerance. Baseline: Per modified Oswestry pain limits sitting >1/2-hour and standing >10 minutes Goal status: IN PROGRESS   PLAN:  PT FREQUENCY: 2x/week  PT DURATION: 6-8 weeks  PLANNED INTERVENTIONS: [id number]- PT Re-evaluation, 97750- Physical Performance Testing, 97110-Therapeutic exercises, 97530- Therapeutic activity, [id number]- Neuromuscular re-education, 97535- Self Care, [id number]- Manual therapy, [id number]- Gait training, [phone]- Aquatic Therapy, [phone]- Electrical stimulation (unattended), 97035- Ultrasound, 79439 (1-2 muscles), 20561 (3+ muscles)- Dry Needling, Patient/Family education, Balance training, Taping, Joint mobilization, Spinal mobilization, Cryotherapy, and Moist heat  PLAN FOR NEXT SESSION:  Continue machine weight training with core stabilization as patient is very interested in this;  Ball stabilization exercises. Not interested in heat or dry needling at this time.     Kasara Schomer, PT 02/17/2024, 12:59 PM

## 2024-02-18 ENCOUNTER — Ambulatory Visit (INDEPENDENT_AMBULATORY_CARE_PROVIDER_SITE_OTHER)

## 2024-02-18 ENCOUNTER — Encounter: Payer: Self-pay | Admitting: Podiatry

## 2024-02-18 ENCOUNTER — Ambulatory Visit (INDEPENDENT_AMBULATORY_CARE_PROVIDER_SITE_OTHER): Admitting: Podiatry

## 2024-02-18 DIAGNOSIS — M79671 Pain in right foot: Secondary | ICD-10-CM | POA: Diagnosis not present

## 2024-02-18 DIAGNOSIS — M21621 Bunionette of right foot: Secondary | ICD-10-CM

## 2024-02-18 DIAGNOSIS — M7751 Other enthesopathy of right foot: Secondary | ICD-10-CM | POA: Diagnosis not present

## 2024-02-18 MED ORDER — TRIAMCINOLONE ACETONIDE 10 MG/ML IJ SUSP
10.0000 mg | Freq: Once | INTRAMUSCULAR | Status: AC
  Administered 2024-02-18: 10 mg

## 2024-02-18 NOTE — Progress Notes (Signed)
 Patient present with pain plantar aspect of her left foot along the styloid process of the fifth metatarsal some tenderness at the MTP.  This began in the past few months.  Has not noticed any redness swelling or cyst.  Painful with walking.  Does not recall any injury to it   Physical exam:  General appearance: Pleasant, and in no acute distress. AOx3.  Vascular: Pedal pulses: DP 2 to/4 bilaterally, PT 2/4 bilaterally.  Mild edema lower legs bilaterally. Capillary fill time immediate.  Neurological: Light touch intact feet bilaterally.  Normal Achilles reflex bilaterally.  No clonus or spasticity noted.   Dermatologic:   Skin normal temperature bilaterally.  Skin thin atrophic no hair growth on the lower extremity.  Some atrophy of fat pad right   musculoskeletal: Tenderness in the fifth metatarsal base plantar laterally.  Tenderness to palpation.  No tenderness along the peroneal tendons right foot.  Slight tenderness on the pla met adductus deformity right ntar medial aspect of the fifth metatarsal phalangeal joint right.  Radiographs: 3 views right foot: Metadductus deformity.  Tailor's bunion deformity.  Hallux abductovalgus deformity.  No evidence of any fractures or dislocations.  No evidence of any bone tumors.  No osteophytic changes to the fifth metatarsal base  Diagnosis: 1.  Pain right foot 2.  Bursitis right foot. 3.  Tailor's bunion deformity right  Plan: -New patient visit office level 3 for evaluation and management.  Modifier 25. - Discussed with him proper shoes and socks to wear.  Discussed cushioning.  Discussed etiology and treatment.  Explained that this is usually managed in a conservative manner. -injected 3cc 2:1 mixture 0.5 cc Marcaine :Kenolog 10mg /71ml at [facility] plantar lateral foot fifth metatarsal base. -Dispensed OTC insoles     Return 2 weeks follow-up injection right foot

## 2024-02-20 ENCOUNTER — Encounter

## 2024-02-24 ENCOUNTER — Encounter: Payer: Self-pay | Admitting: Physical Therapy

## 2024-02-24 ENCOUNTER — Ambulatory Visit: Attending: Neurosurgery | Admitting: Physical Therapy

## 2024-02-24 DIAGNOSIS — M62830 Muscle spasm of back: Secondary | ICD-10-CM | POA: Insufficient documentation

## 2024-02-24 DIAGNOSIS — M5459 Other low back pain: Secondary | ICD-10-CM | POA: Diagnosis present

## 2024-02-24 DIAGNOSIS — M6281 Muscle weakness (generalized): Secondary | ICD-10-CM | POA: Insufficient documentation

## 2024-02-24 DIAGNOSIS — G8929 Other chronic pain: Secondary | ICD-10-CM | POA: Diagnosis present

## 2024-02-24 DIAGNOSIS — M5442 Lumbago with sciatica, left side: Secondary | ICD-10-CM | POA: Insufficient documentation
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
## 2024-02-27 ENCOUNTER — Ambulatory Visit

## 2024-02-27 DIAGNOSIS — M6281 Muscle weakness (generalized): Secondary | ICD-10-CM

## 2024-02-27 DIAGNOSIS — M62830 Muscle spasm of back: Secondary | ICD-10-CM

## 2024-02-27 DIAGNOSIS — M5459 Other low back pain: Secondary | ICD-10-CM | POA: Diagnosis not present

## 2024-02-27 DIAGNOSIS — G8929 Other chronic pain: Secondary | ICD-10-CM

## 2024-02-27 NOTE — Therapy (Signed)
 OUTPATIENT PHYSICAL THERAPY TREATMENT   Patient Name: Erik Salazar MRN: [id number] DOB:1950-12-20, 73 y.o., male Today's Date: 02/27/2024  END OF SESSION:  PT End of Session - 02/27/24 0853     Visit Number 6    Date for PT Re-Evaluation 03/30/24    Authorization Type [employer]    PT Start Time 0845    PT Stop Time 0932    PT Time Calculation (min) 47 min    Activity Tolerance Patient tolerated treatment well    Behavior During Therapy [facility] for tasks assessed/performed              Past Medical History:  Diagnosis Date   Allergy see attached info   Arthritis    RA and OA   Blood transfusion without reported diagnosis    with 2nd heart surgery   Carpal tunnel syndrome on right 07/08/2017   Cervical radiculopathy at C6 11/14/2017   Right   Complication of anesthesia    Coronary artery disease    Enlarged prostate    Fibromyalgia    H/O: knee surgery    15    Hayfever    WHEN YOUNG   History of blood clots    History of kidney stones    History of open heart surgery    ASCENDING AORTIC ANEURYSM   Ischemic stroke of frontal lobe (HCC) 03/14/2017   Bilateral; post-redo CT surgery   Pneumonia    PONV (postoperative nausea and vomiting)    only after CABG surgeries   Seizure disorder (HCC) 03/14/2017   Seizures (HCC)    last one 02/2021,updated 09/25/22   Status post knee surgery    DVT POST KNEE SURGERY   Stroke [facility])    Past Surgical History:  Procedure Laterality Date   ANTERIOR CERVICAL DECOMP/DISCECTOMY FUSION N/A 05/04/2020   Procedure: Anterior Cervical Decompression/Discectomy Fuion Cervical three-four, Cervical four-five, Cervical five-six;  Surgeon: Onetha Kuba, MD;  Location: [facility] OR;  Service: Neurosurgery;  Laterality: N/A;   BACK SURGERY     lower back 06/28/2021   BALLOON DILATION N/A 06/23/2019   Procedure: BALLOON DILATION;  Surgeon: Elicia Claw, MD;  Location: [facility] ENDOSCOPY;  Service: Gastroenterology;  Laterality: N/A;   BENTALL  PROCEDURE  01/04/2016   Bentall with 23 mm pericardial AVR; SVG-LAD, SVG-CX [facility] [place]   BICEPS TENDON REPAIR Right    BIOPSY  06/23/2019   Procedure: BIOPSY;  Surgeon: Elicia Claw, MD;  Location: [facility]OSCOPY;  Service: Gastroenterology;;   CARDIAC SURGERY     ANUERSYM MAY 2017   Bentall procedure. Bioprosthetic aortic valve #23 mm bovine model #2700 TF ask, and 28 mm Gelweave woven vascular sinus of Valsalva graft   CARDIAC VALVE REPLACEMENT  02/2016, 02/2017   CARPAL TUNNEL RELEASE  08/2018   right hand    COLONOSCOPY WITH PROPOFOL  N/A 10/22/2022   Procedure: COLONOSCOPY WITH PROPOFOL ;  Surgeon: San Sandor GAILS, DO;  Location: [facility];  Service: Gastroenterology;  Laterality: N/A;   CORONARY ARTERY BYPASS GRAFT  01/04/2016   VG to LAD & VG to LCX   CORONARY ARTERY BYPASS GRAFT  03/13/2017   LIMA to LAD with steril abcess removal from dacron graft   CYSTOSCOPY/URETEROSCOPY/HOLMIUM LASER/STENT PLACEMENT Left 06/12/2022   Procedure: CYSTOSCOPY/URETEROSCOPY/HOLMIUM LASER/STENT PLACEMENT;  Surgeon: Carolee Sherwood JONETTA DOUGLAS, MD;  Location: [facility] ORS;  Service: Urology;  Laterality: Left;   ESOPHAGOGASTRODUODENOSCOPY     Had done dilatation done about 2 or 3 times before in [place]   ESOPHAGOGASTRODUODENOSCOPY (EGD)  WITH PROPOFOL  N/A 06/23/2019   Procedure: ESOPHAGOGASTRODUODENOSCOPY (EGD) WITH PROPOFOL ;  Surgeon: Elicia Claw, MD;  Location: [facility];  Service: Gastroenterology;  Laterality: N/A;   FALSE ANEURYSM REPAIR  03/13/2017   redo sternotomy, sterile abscess removal from Dacron graft, omental flap around aorta, CABG: LIMA-LAD ([name], Dr. Zachary Remington)   GREATER OMENTAL FLAP CLOSURE  2018   Of pericardium 2018   INSERTION OF MESH  10/14/2020   Procedure: INSERTION OF MESH;  Surgeon: Sheldon Standing, MD;  Location: [facility] OR;  Service: General;;   JOINT REPLACEMENT  knee   knee surgeries     13 surgeries on knee done before knee replacement    KNEE SURGERY   1983   LAPAROSCOPIC LYSIS OF ADHESIONS N/A 05/19/2021   Procedure: LYSIS OF ADHESIONS;  Surgeon: Sheldon Standing, MD;  Location: [facility] OR;  Service: General;  Laterality: N/A;  GEN & LOCAL   LYSIS OF ADHESION N/A 10/14/2020   Procedure: LYSIS OF ADHESION;  Surgeon: Sheldon Standing, MD;  Location: [facility] OR;  Service: General;  Laterality: N/A;   open heart surgery     03-13-2017   REPLACEMENT TOTAL KNEE Left 2015   ROTATOR CUFF REPAIR Right    done with biceps tendon repair   SPINE SURGERY  upper neck, lower spine   TONSILLECTOMY     removed as a child.   TOTAL KNEE ARTHROPLASTY Right 01/18/2022   Procedure: RIGHT TOTAL KNEE ARTHROPLASTY;  Surgeon: Jerri Kay HERO, MD;  Location: [facility] OR;  Service: Orthopedics;  Laterality: Right;   VENTRAL HERNIA REPAIR N/A 10/14/2020   Procedure: LAPAROSCOPIC VENTRAL HERNIA REPAIR WITH TAP BLOCK BILATERAL;  Surgeon: Sheldon Standing, MD;  Location: [facility] OR;  Service: General;  Laterality: N/A;   VENTRAL HERNIA REPAIR N/A 05/19/2021   Procedure: LAPAROSCOPIC VENTRAL WALL HERNIA REPAIR;  Surgeon: Sheldon Standing, MD;  Location: [facility] OR;  Service: General;  Laterality: N/A;   Patient Active Problem List   Diagnosis Date Noted   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   Constipation 10/22/2022   Hypertrophied anal papilla 10/22/2022   Colon cancer screening 10/22/2022   Postop check 03/26/2022   Gait abnormality 01/29/2022   Status post total right knee replacement 01/18/2022   Coronary artery disease involving native coronary artery of native heart without angina pectoris 01/04/2022   Preop cardiovascular exam 01/04/2022   Primary osteoarthritis of right knee 01/04/2022   Acute right-sided low back pain without sciatica 12/25/2021   Aortic atherosclerosis (HCC) 09/04/2021   Spondylolisthesis at L4-L5 level 06/28/2021   S/P repair of ventral hernia 05/19/2021   S/P repair of recurrent ventral hernia 05/19/2021   Benign prostatic hyperplasia without  lower urinary tract symptoms 10/14/2020   Chronic pain 10/14/2020   ED (erectile dysfunction) of organic origin 10/14/2020   Esophageal dysphagia 10/14/2020   Gastroesophageal reflux disease 10/14/2020   History of aortic valve replacement with bioprosthetic valve 10/14/2020   Hx of aortic aneurysm repair 10/14/2020   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   Thoracic aortic aneurysm without rupture (HCC) 10/14/2020   Recurrent incisional hernia 10/14/2020   Spinal stenosis in cervical region 05/04/2020   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   Cervical radiculopathy at C6 11/14/2017   Carpal tunnel syndrome on right 07/08/2017   Tremor, essential 07/08/2017   Ischemic stroke of frontal lobe (HCC) 04/05/2017   Pain in right hand 04/05/2017   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 03/27/2017   Seizure (HCC) 03/14/2017   Presence of aortocoronary bypass  graft 03/13/2017   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HCC) 03/12/2017   Essential hypertension 02/26/2017   Mixed hyperlipidemia 02/26/2017    PCP: Jason Leita Repine, FNP   REFERRING PROVIDER: Onetha Kuba, MD   REFERRING DIAG: [phone] (ICD-10-CM) - Spondylolisthesis, lumbar region   THERAPY DIAG:  Other low back pain  Muscle spasm of back  Muscle weakness (generalized)  Chronic midline low back pain with left-sided sciatica  RATIONALE FOR EVALUATION AND TREATMENT: Rehabilitation  ONSET DATE: Acute on chronic x ~2 months  NEXT MD VISIT: ESI 02/25/2024; MD f/u ~03/17/2024   SUBJECTIVE:                                                                                                                                                                                                         SUBJECTIVE STATEMENT: Pt reports he had six ESI, three on each side of his low back, reported increased pain afterward. Will f/u with Dr. Onetha in ~ 3 weeks.  PAIN: Are you having pain? Yes: NPRS scale: 8/10   Pain location: Midline lumbar spine  Pain  description: sharp  Aggravating factors: almost everything  Relieving factors: not doing anything, elevating his legs while sitting   PERTINENT HISTORY:  History chronic back pain, multiple back surgeries, ACDF, B TKA, RA and OA, DVT, CVA frontal lobe with memory deficits, seizures, open heart surgery to repair aneurism, CABG x 2, omental flap graft to mediastinum, hernia repair.   PRECAUTIONS: None  RED FLAGS: None  WEIGHT BEARING RESTRICTIONS: No  FALLS:  Has patient fallen in last 6 months? No  LIVING ENVIRONMENT:  Lives with: lives with their spouse Lives in: House/apartment Stairs: No Has following equipment at home: Single point cane, Walker - 2 wheeled, and bed side commode  OCCUPATION: Retired  PLOF: Independent and Leisure: exercises frequently - walk treadmill    PATIENT GOALS: Take away the pain.   OBJECTIVE: (objective measures completed at initial evaluation unless otherwise dated)  DIAGNOSTIC FINDINGS:  01/26/24 - MR Lumbar Spine: Results still pending as of 02/03/2024   04/08/21 - MR Lumbar Spine:  IMPRESSION: No change is appreciated compared to the study of last year.   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 Pseudo disc herniation. Bilateral foraminal stenosis could possibly affect either or both exiting L5 nerves.   L3-4: Mild multifactorial stenosis secondary to bulging of the disc and mild facet and ligamentous prominence. No visible neural compression.   Other levels show disc bulges and facet degeneration but no apparent neural compressive stenosis.  PATIENT SURVEYS:  Modified Oswestry 25 / 50 = 50.0 %, severe disability   SCREENING FOR RED FLAGS: Bowel or bladder incontinence: No Spinal tumors: No Cauda equina syndrome: No Compression fracture: No Abdominal aneurysm: No  COGNITION:  Overall cognitive status: Impaired - STM   SENSATION: WFL Numbness in hands from prior CVA  POSTURE:  decreased lumbar  lordosis  PALPATION: TTP midline lumbar spine and over prior surgical incision. Increased muscle tension in B lumbar paraspinals but denies TTP.  LUMBAR ROM:   Active  Eval - p! w/ all motions 02/27/24  Flexion Hands to knees Hands to ankles  Extension 60% limited 50% limited  Right lateral flexion Hand to lateral knee Lateral knee  Left lateral flexion Hand to lateral knee Lateral knee  Right rotation 40% limited   Left rotation 40% limited   (Blank rows = not tested)  MUSCLE LENGTH: Hamstrings: mod tight R, mild tight L ITB: mod tight R, mild tight L Piriformis: mod/severe tight B Hip flexors: mod tight B Quads: mod tight B Heelcord:   LOWER EXTREMITY ROM:    Limited d/t h/o TKA and low back pain   LOWER EXTREMITY MMT:    MMT Right eval Left eval  Hip flexion 4 4+  Hip extension 4- 4  Hip abduction 4 4+  Hip adduction 4+ 4+  Hip internal rotation 4- p! Lateral knee 4+  Hip external rotation 4- p! Lateral knee 4  Knee flexion 4+ 5  Knee extension 4+ 5  Ankle dorsiflexion 3+ 3+  Ankle plantarflexion    Ankle inversion    Ankle eversion     (Blank rows = not tested)  LUMBAR SPECIAL TESTS:  Straight leg raise test: Positive    TODAY'S TREATMENT:  02/27/24 NEUROMUSCULAR RE-EDUCATION: To improve kinesthesia, posture, and proprioception.  Hooklying clam blue TB + TrA 2x10 Bridge with orange pball x 10- mild pain KTC w/ orange pball x 20 Seated rows 35lb high grips x 10 chest support; x 10 no chest support Standing trunk rotation GTB x 20 B  THERAPEUTIC EXERCISE: To improve strength, endurance, ROM, and flexibility. Recumbent Bike L3x10 min MANUAL THERAPY: To promote normalized muscle tension and for pain modulation  IASTM with foam roll to B lumbar paraspinals  02/24/2024 THERAPEUTIC EXERCISE: To improve strength, endurance, and flexibility.  Demonstration, verbal and tactile cues throughout for technique.  Rec Bike - L2 x 10 min Hooklying HS stretch with  strap 3 x 30 bil Supine ITB stretch with strap 3 x 30 bil Supine bent knee cross-body glute stretch - deferred d/t increased LBP  NEUROMUSCULAR RE-EDUCATION: To improve coordination, kinesthesia, and posture. Hooklying TrA + alternating unilateral GTB hip ABD/ER bent-knee fallout 10 x 3 Bridge + GTB hip ABD isometric 10 x 3  MANUAL THERAPY: To promote normalized muscle tension and reduced pain utilizing connective tissue massage and therapeutic massage.  IASTM with foam roller to B lumbar paraspinals  SELF CARE: Provided education to prevent loss of gains achieved with physical therapy. Provided instruction in self-STM techniques to lumbar paraspinals using foam roller on wall.   02/17/2024 THERAPEUTIC EXERCISE: To improve strength and endurance.  Demonstration, verbal and tactile cues throughout for technique. Nustep L5 x 10' UE/LE for endurance  SELF CARE: Provided education to prevent loss of gains achieved with physical therapy and to prevent future decline in function. HEP review: Supine SLR stretch w/ strap x 1' x 2 BLE Supine cross over piriformis stretch x 1' x 2 RLE FABER piriformis stretch x 1'  x 2 LLE  NEUROMUSCULAR RE-EDUCATION: To improve posture and proprioception. Multifidus walkouts x 10 BLE Standing RTB pulls to side with core stabilization Step-ups with Ta's Seated ball rollouts BUE multi directional x 2' Supine ball rollouts BLE x 1' w/ TA Supine ball S/S BLE x 1' w/ TA Supine bridging x 30 w/ TA Supine pelvic tilts   02/12/24 THERAPEUTIC EXERCISE: To improve flexibility.  Demonstration, verbal and tactile cues throughout for technique. Nustep L5x8min UE/LE- for endurance Trunk rotations in sitting x 5 B Seated flexion pball rollout 10x3; 5x Rotation B  NEUROMUSCULAR RE-EDUCATION: To improve postural awareness and core activtion Lat pull downs 15lb 3x10 Seated rows 25lb 2x10 low grips Farmer walk with 10lb kettle bell x 2 laps unilateral OHP 10lb B  2x10 Multifidus walkout RTB 5x10    02/10/24 THERAPEUTIC EXERCISE: To improve flexibility.  Demonstration, verbal and tactile cues throughout for technique. Nustep L4x61min UE/LE- for endurance NEUROMUSCULAR RE-EDUCATION: To improve coordination, kinesthesia, posture, and proprioception.  Seated ab sets orange pball 2x10 3 sec holds Seated pallof press RTB doubled 2x10 Seated rows RTB 2x10  Seated shoulder extension RTB 2x10 Standing heel/toe raise 10x3 B   02/03/2024 - Eval SELF CARE:  Reviewed eval findings and role of PT in addressing identified deficits as well as instruction in initial HEP (see below).   THERAPEUTIC EXERCISE: To improve flexibility.  Demonstration, verbal and tactile cues throughout for technique.  Supine and hooklying SKTC stretches x 15 each - deferred due to increased LBP R>L Hooklying KTOS piriformis stretch x 15 - deferred due to increased LBP R>L Hooklying LTR 2 x 10 - deferred due to increased LBP R>L Hooklying HS stretch with strap x 30 bil Supine ITB stretch with strap x 30 bil   PATIENT EDUCATION:  Education details: HEP update  Person educated: Patient Education method: Explanation, Demonstration, Verbal cues, and Handouts Education comprehension: verbalized understanding, returned demonstration, verbal cues required, and needs further education  HOME EXERCISE PROGRAM: Access Code: [email or web address] URL: [email or web address] Date: 02/10/2024 Prepared by: Sol Gaskins  Exercises - Supine Hamstring Stretch with Strap  - 2 x daily - 7 x weekly - 3 reps - 30 sec hold - Supine Iliotibial Band Stretch with Strap  - 2 x daily - 7 x weekly - 3 reps - 30 sec hold - Standing Shoulder Row with Anchored Resistance  - 1 x daily - 7 x weekly - 3 sets - 10 reps - Shoulder extension with resistance - Neutral  - 1 x daily - 7 x weekly - 3 sets - 10 reps - Standing Anti-Rotation Press with Anchored Resistance  - 1 x daily - 7 x weekly - 3 sets -  10 reps   ASSESSMENT:  CLINICAL IMPRESSION: Pt has received ESI 2 days ago for low back, three for each side of his low back, he reports mild increased pain from this ever since. Lumbar AROM increased for flexion and extension. Continued with core stabilization but incorporating more trunk rotational movements for mobility as well. Pt responded well, will continue efforts to improve function. Ege will benefit from continued skilled PT to address ongoing ROM, flexibility and strength deficits to improve mobility and activity tolerance with decreased pain interference.    Eval: Imran Renz is a 73 y.o. male who was referred to physical therapy for evaluation and treatment for acute on chronic LBP secondary to lumbar spondylolisthesis.  Patient reports onset of current midline low back pain beginning ~2 months ago without known MOI.  He is unable to isolate aggravating or relieving factors.  Patient has deficits in lumbar ROM, proximal LE flexibility, B LE strength, abnormal posture, and TTP with abnormal muscle tension  which are interfering with ADLs and are impacting quality of life.  On Modified Oswestry patient scored 25/50 demonstrating 50% or severe disability.  Xavien will benefit from skilled PT to address above deficits to improve mobility and activity tolerance with decreased pain interference.  Attempt at initial HEP creation limited by increased pain.  PT referral included orders for TPDN - patient made aware of recent change in rehab policy requiring OOP cash pay for TPDN at time of service in order to remain in regulatory and legal compliance with billing.  OBJECTIVE IMPAIRMENTS: decreased activity tolerance, decreased endurance, decreased knowledge of condition, decreased mobility, difficulty walking, decreased ROM, decreased strength, increased fascial restrictions, impaired perceived functional ability, increased muscle spasms, impaired flexibility, improper body mechanics, postural  dysfunction, and pain.  ACTIVITY LIMITATIONS: carrying, lifting, bending, sitting, standing, squatting, sleeping, stairs, transfers, bed mobility, locomotion level, and caring for others   PARTICIPATION LIMITATIONS: meal prep, cleaning, laundry, driving, shopping, community activity, and yard work  PERSONAL FACTORS: Age, Fitness, Past/current experiences, Time since onset of injury/illness/exacerbation, and 3+ comorbidities: History chronic back pain, multiple back surgeries, ACDF, B TKA, RA and OA, DVT, CVA frontal lobe with memory deficits, seizures, open heart surgery to repair aneurism, CABG x 2, omental flap graft to mediastinum, hernia repair.  are also affecting patient's functional outcome.   REHAB POTENTIAL: Good  CLINICAL DECISION MAKING: Evolving/moderate complexity  EVALUATION COMPLEXITY: Moderate   GOALS: Goals reviewed with patient? Yes  SHORT TERM GOALS: Target date: 03/02/2024  Patient will be independent with initial HEP to improve outcomes and carryover.  Baseline: Partial initial HEP provided on eval but limited by pain Goal status: MET - 02/24/24   2.  Patient will report 25% improvement in low back pain to improve QOL. Baseline: 7/10 on eval, up to 10/10 at worst Goal status: IN PROGRESS - 02/24/24 - no significant change since eval  LONG TERM GOALS: Target date: 03/30/2024  Patient will be independent with ongoing/advanced HEP for self-management at home.  Baseline:  Goal status: IN PROGRESS  2.  Patient will report 50-75% improvement in low back pain to improve QOL.  Baseline: 7/10 on eval, up to 10/10 at worst Goal status: IN PROGRESS  3.  Patient to demonstrate ability to achieve and maintain good spinal alignment/posturing and body mechanics needed for daily activities. Baseline:  Goal status: IN PROGRESS  4.  Patient will demonstrate functional pain free lumbar ROM to perform ADLs.   Baseline: Refer to above lumbar ROM table Goal status: IN PROGRESS-  02/27/24  5.  Patient will demonstrate improved B LE strength to >/= 4+/5 for improved stability and ease of mobility. Baseline: Refer to above LE MMT table Goal status: IN PROGRESS  6. Patient will report </= 38% on Modified Oswestry (MCID = 12%) to demonstrate improved functional ability with decreased pain interference. Baseline: 25 / 50 = 50.0 % Goal status: IN PROGRESS  7.  Patient will tolerate 20-30 min of sitting or standing w/o increased pain to allow for  improved mobility and activity tolerance. Baseline: Per modified Oswestry pain limits sitting >1/2-hour and standing >10 minutes Goal status: IN PROGRESS   PLAN:  PT FREQUENCY: 2x/week  PT DURATION: 6-8 weeks  PLANNED INTERVENTIONS: 97164- PT Re-evaluation, 97750- Physical Performance Testing, 97110-Therapeutic exercises, 97530- Therapeutic activity, [id number]- Neuromuscular re-education, 97535-  Self Care, [id number]- Manual therapy, [phone]- Gait training, [phone]- Aquatic Therapy, [phone]- Electrical stimulation (unattended), [phone]- Ultrasound, [phone] (1-2 muscles), [phone] (3+ muscles)- Dry Needling, Patient/Family education, Balance training, Taping, Joint mobilization, Spinal mobilization, Cryotherapy, and Moist heat  PLAN FOR NEXT SESSION:  Incorporate more lumbar rotational and side bend motion gently; Continue machine weight training with core stabilization as patient is very interested in this;  Ball stabilization exercises. Not interested in heat or dry needling at this time.     Sol LITTIE Gaskins, PTA 02/27/2024, 10:30 AM

## 2024-03-02 ENCOUNTER — Other Ambulatory Visit: Payer: Self-pay | Admitting: Surgery

## 2024-03-02 ENCOUNTER — Ambulatory Visit: Admitting: Physical Therapy

## 2024-03-02 ENCOUNTER — Encounter: Payer: Self-pay | Admitting: Physical Therapy

## 2024-03-02 DIAGNOSIS — I7121 Aneurysm of the ascending aorta, without rupture: Secondary | ICD-10-CM

## 2024-03-02 DIAGNOSIS — M5459 Other low back pain: Secondary | ICD-10-CM | POA: Diagnosis not present

## 2024-03-02 DIAGNOSIS — M62830 Muscle spasm of back: Secondary | ICD-10-CM

## 2024-03-02 DIAGNOSIS — M6281 Muscle weakness (generalized): Secondary | ICD-10-CM

## 2024-03-02 NOTE — Therapy (Signed)
 OUTPATIENT PHYSICAL THERAPY TREATMENT   Patient Name: Erik Salazar MRN: [id number] DOB:24-Sep-1950, 73 y.o., male Today's Date: 03/02/2024  END OF SESSION:  PT End of Session - 03/02/24 0849     Visit Number 7    Date for PT Re-Evaluation 03/30/24    Authorization Type [employer]    PT Start Time [phone]    PT Stop Time 0936    PT Time Calculation (min) 47 min    Activity Tolerance Patient tolerated treatment well    Behavior During Therapy [facility] for tasks assessed/performed               Past Medical History:  Diagnosis Date   Allergy see attached info   Arthritis    RA and OA   Blood transfusion without reported diagnosis    with 2nd heart surgery   Carpal tunnel syndrome on right 07/08/2017   Cervical radiculopathy at C6 11/14/2017   Right   Complication of anesthesia    Coronary artery disease    Enlarged prostate    Fibromyalgia    H/O: knee surgery    15    Hayfever    WHEN YOUNG   History of blood clots    History of kidney stones    History of open heart surgery    ASCENDING AORTIC ANEURYSM   Ischemic stroke of frontal lobe (HCC) 03/14/2017   Bilateral; post-redo CT surgery   Pneumonia    PONV (postoperative nausea and vomiting)    only after CABG surgeries   Seizure disorder (HCC) 03/14/2017   Seizures (HCC)    last one 02/2021,updated 09/25/22   Status post knee surgery    DVT POST KNEE SURGERY   Stroke [facility])    Past Surgical History:  Procedure Laterality Date   ANTERIOR CERVICAL DECOMP/DISCECTOMY FUSION N/A 05/04/2020   Procedure: Anterior Cervical Decompression/Discectomy Fuion Cervical three-four, Cervical four-five, Cervical five-six;  Surgeon: Onetha Kuba, MD;  Location: [facility] OR;  Service: Neurosurgery;  Laterality: N/A;   BACK SURGERY     lower back 06/28/2021   BALLOON DILATION N/A 06/23/2019   Procedure: BALLOON DILATION;  Surgeon: Elicia Claw, MD;  Location: [facility];  Service: Gastroenterology;  Laterality: N/A;   BENTALL  PROCEDURE  01/04/2016   Bentall with 23 mm pericardial AVR; SVG-LAD, SVG-CX [facility] [place]   BICEPS TENDON REPAIR Right    BIOPSY  06/23/2019   Procedure: BIOPSY;  Surgeon: Elicia Claw, MD;  Location: [facility];  Service: Gastroenterology;;   CARDIAC SURGERY     ANUERSYM MAY 2017   Bentall procedure. Bioprosthetic aortic valve #23 mm bovine model #2700 TF ask, and 28 mm Gelweave woven vascular sinus of Valsalva graft   CARDIAC VALVE REPLACEMENT  02/2016, 02/2017   CARPAL TUNNEL RELEASE  08/2018   right hand    COLONOSCOPY WITH PROPOFOL  N/A 10/22/2022   Procedure: COLONOSCOPY WITH PROPOFOL ;  Surgeon: San Sandor GAILS, DO;  Location: [facility];  Service: Gastroenterology;  Laterality: N/A;   CORONARY ARTERY BYPASS GRAFT  01/04/2016   VG to LAD & VG to LCX   CORONARY ARTERY BYPASS GRAFT  03/13/2017   LIMA to LAD with steril abcess removal from dacron graft   CYSTOSCOPY/URETEROSCOPY/HOLMIUM LASER/STENT PLACEMENT Left 06/12/2022   Procedure: CYSTOSCOPY/URETEROSCOPY/HOLMIUM LASER/STENT PLACEMENT;  Surgeon: Carolee Sherwood JONETTA DOUGLAS, MD;  Location: [facility];  Service: Urology;  Laterality: Left;   ESOPHAGOGASTRODUODENOSCOPY     Had done dilatation done about 2 or 3 times before in [place]   ESOPHAGOGASTRODUODENOSCOPY (  EGD) WITH PROPOFOL  N/A 06/23/2019   Procedure: ESOPHAGOGASTRODUODENOSCOPY (EGD) WITH PROPOFOL ;  Surgeon: Elicia Claw, MD;  Location: [facility];  Service: Gastroenterology;  Laterality: N/A;   FALSE ANEURYSM REPAIR  03/13/2017   redo sternotomy, sterile abscess removal from Dacron graft, omental flap around aorta, CABG: LIMA-LAD ([name], Dr. Zachary Remington)   GREATER OMENTAL FLAP CLOSURE  2018   Of pericardium 2018   INSERTION OF MESH  10/14/2020   Procedure: INSERTION OF MESH;  Surgeon: Sheldon Standing, MD;  Location: [facility] OR;  Service: General;;   JOINT REPLACEMENT  knee   knee surgeries     13 surgeries on knee done before knee replacement    KNEE SURGERY   1983   LAPAROSCOPIC LYSIS OF ADHESIONS N/A 05/19/2021   Procedure: LYSIS OF ADHESIONS;  Surgeon: Sheldon Standing, MD;  Location: [facility] OR;  Service: General;  Laterality: N/A;  GEN & LOCAL   LYSIS OF ADHESION N/A 10/14/2020   Procedure: LYSIS OF ADHESION;  Surgeon: Sheldon Standing, MD;  Location: [facility] OR;  Service: General;  Laterality: N/A;   open heart surgery     03-13-2017   REPLACEMENT TOTAL KNEE Left 2015   ROTATOR CUFF REPAIR Right    done with biceps tendon repair   SPINE SURGERY  upper neck, lower spine   TONSILLECTOMY     removed as a child.   TOTAL KNEE ARTHROPLASTY Right 01/18/2022   Procedure: RIGHT TOTAL KNEE ARTHROPLASTY;  Surgeon: Jerri Kay HERO, MD;  Location: [facility] OR;  Service: Orthopedics;  Laterality: Right;   VENTRAL HERNIA REPAIR N/A 10/14/2020   Procedure: LAPAROSCOPIC VENTRAL HERNIA REPAIR WITH TAP BLOCK BILATERAL;  Surgeon: Sheldon Standing, MD;  Location: [facility] OR;  Service: General;  Laterality: N/A;   VENTRAL HERNIA REPAIR N/A 05/19/2021   Procedure: LAPAROSCOPIC VENTRAL WALL HERNIA REPAIR;  Surgeon: Sheldon Standing, MD;  Location: [facility] OR;  Service: General;  Laterality: N/A;   Patient Active Problem List   Diagnosis Date Noted   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   Constipation 10/22/2022   Hypertrophied anal papilla 10/22/2022   Colon cancer screening 10/22/2022   Postop check 03/26/2022   Gait abnormality 01/29/2022   Status post total right knee replacement 01/18/2022   Coronary artery disease involving native coronary artery of native heart without angina pectoris 01/04/2022   Preop cardiovascular exam 01/04/2022   Primary osteoarthritis of right knee 01/04/2022   Acute right-sided low back pain without sciatica 12/25/2021   Aortic atherosclerosis (HCC) 09/04/2021   Spondylolisthesis at L4-L5 level 06/28/2021   S/P repair of ventral hernia 05/19/2021   S/P repair of recurrent ventral hernia 05/19/2021   Benign prostatic hyperplasia without  lower urinary tract symptoms 10/14/2020   Chronic pain 10/14/2020   ED (erectile dysfunction) of organic origin 10/14/2020   Esophageal dysphagia 10/14/2020   Gastroesophageal reflux disease 10/14/2020   History of aortic valve replacement with bioprosthetic valve 10/14/2020   Hx of aortic aneurysm repair 10/14/2020   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   Thoracic aortic aneurysm without rupture (HCC) 10/14/2020   Recurrent incisional hernia 10/14/2020   Spinal stenosis in cervical region 05/04/2020   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   Cervical radiculopathy at C6 11/14/2017   Carpal tunnel syndrome on right 07/08/2017   Tremor, essential 07/08/2017   Ischemic stroke of frontal lobe (HCC) 04/05/2017   Pain in right hand 04/05/2017   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 03/27/2017   Seizure (HCC) 03/14/2017   Presence of aortocoronary  bypass graft 03/13/2017   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HCC) 03/12/2017   Essential hypertension 02/26/2017   Mixed hyperlipidemia 02/26/2017    PCP: Jason Leita Repine, FNP   REFERRING PROVIDER: Onetha Kuba, MD   REFERRING DIAG: [phone] (ICD-10-CM) - Spondylolisthesis, lumbar region   THERAPY DIAG:  Other low back pain  Muscle spasm of back  Muscle weakness (generalized)  RATIONALE FOR EVALUATION AND TREATMENT: Rehabilitation  ONSET DATE: Acute on chronic x ~2 months  NEXT MD VISIT: ESI 02/25/2024; MD f/u ~03/17/2024   SUBJECTIVE:                                                                                                                                                                                                         SUBJECTIVE STATEMENT: Increased pain after ESIs seems to be subsiding, but still not really helping with his baseline pain.  PAIN: Are you having pain? Yes: NPRS scale: 6/10   Pain location: Midline lumbar spine  Pain description: sharp  Aggravating factors: almost everything  Relieving factors: not doing  anything, elevating his legs while sitting   PERTINENT HISTORY:  History chronic back pain, multiple back surgeries, ACDF, B TKA, RA and OA, DVT, CVA frontal lobe with memory deficits, seizures, open heart surgery to repair aneurism, CABG x 2, omental flap graft to mediastinum, hernia repair.   PRECAUTIONS: None  RED FLAGS: None  WEIGHT BEARING RESTRICTIONS: No  FALLS:  Has patient fallen in last 6 months? No  LIVING ENVIRONMENT:  Lives with: lives with their spouse Lives in: House/apartment Stairs: No Has following equipment at home: Single point cane, Walker - 2 wheeled, and bed side commode  OCCUPATION: Retired  PLOF: Independent and Leisure: exercises frequently - walk treadmill    PATIENT GOALS: Take away the pain.   OBJECTIVE: (objective measures completed at initial evaluation unless otherwise dated)  DIAGNOSTIC FINDINGS:  01/26/24 - MR Lumbar Spine: Results still pending as of 02/03/2024   04/08/21 - MR Lumbar Spine:  IMPRESSION: No change is appreciated compared to the study of last year.   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 Pseudo disc herniation. Bilateral foraminal stenosis could possibly affect either or both exiting L5 nerves.   L3-4: Mild multifactorial stenosis secondary to bulging of the disc and mild facet and ligamentous prominence. No visible neural compression.   Other levels show disc bulges and facet degeneration but no apparent neural compressive stenosis.  PATIENT SURVEYS:  Modified Oswestry 25 / 50 = 50.0 %, severe disability   SCREENING FOR RED FLAGS: Bowel or  bladder incontinence: No Spinal tumors: No Cauda equina syndrome: No Compression fracture: No Abdominal aneurysm: No  COGNITION:  Overall cognitive status: Impaired - STM   SENSATION: WFL Numbness in hands from prior CVA  POSTURE:  decreased lumbar lordosis  PALPATION: TTP midline lumbar spine and over prior surgical incision. Increased muscle  tension in B lumbar paraspinals but denies TTP.  LUMBAR ROM:   Active  Eval - p! w/ all motions 02/27/24  Flexion Hands to knees Hands to ankles  Extension 60% limited 50% limited  Right lateral flexion Hand to lateral knee Lateral knee  Left lateral flexion Hand to lateral knee Lateral knee  Right rotation 40% limited   Left rotation 40% limited   (Blank rows = not tested)  MUSCLE LENGTH: Hamstrings: mod tight R, mild tight L ITB: mod tight R, mild tight L Piriformis: mod/severe tight B Hip flexors: mod tight B Quads: mod tight B Heelcord:   LOWER EXTREMITY ROM:    Limited d/t h/o TKA and low back pain   LOWER EXTREMITY MMT:    MMT Right eval Left eval  Hip flexion 4 4+  Hip extension 4- 4  Hip abduction 4 4+  Hip adduction 4+ 4+  Hip internal rotation 4- p! Lateral knee 4+  Hip external rotation 4- p! Lateral knee 4  Knee flexion 4+ 5  Knee extension 4+ 5  Ankle dorsiflexion 3+ 3+  Ankle plantarflexion    Ankle inversion    Ankle eversion     (Blank rows = not tested)  LUMBAR SPECIAL TESTS:  Straight leg raise test: Positive    TODAY'S TREATMENT:   03/02/24 THERAPEUTIC EXERCISE: To improve strength, endurance, and flexibility.   Rec Bike - L3 x 10 min  NEUROMUSCULAR RE-EDUCATION: To improve coordination, kinesthesia, posture, and proprioception. Seated yellow med ball diagonals x 20 bil Standing GTB pallof press x 10 bil Standing GTB trunk rotation x 10 bil Standing GTB 4-way SLR x 10 each bil, intermittent UE support on back of chair for balance  MANUAL THERAPY: To promote normalized muscle tension, improved flexibility, and pain modulation utilizing connective tissue massage, therapeutic massage, and manual TP therapy.  STM/DTM and IASTM with foam roller to R>L lumbar paraspinals and glutes/piriformis   02/27/24 NEUROMUSCULAR RE-EDUCATION: To improve kinesthesia, posture, and proprioception.  Hooklying clam blue TB + TrA 2x10 Bridge with orange pball  x 10- mild pain KTC w/ orange pball x 20 Seated rows 35lb high grips x 10 chest support; x 10 no chest support Standing trunk rotation GTB x 20 B  THERAPEUTIC EXERCISE: To improve strength, endurance, ROM, and flexibility. Recumbent Bike L3x10 min  MANUAL THERAPY: To promote normalized muscle tension and for pain modulation  IASTM with foam roll to B lumbar paraspinals   02/24/2024 THERAPEUTIC EXERCISE: To improve strength, endurance, and flexibility.  Demonstration, verbal and tactile cues throughout for technique.  Rec Bike - L2 x 10 min Hooklying HS stretch with strap 3 x 30 bil Supine ITB stretch with strap 3 x 30 bil Supine bent knee cross-body glute stretch - deferred d/t increased LBP  NEUROMUSCULAR RE-EDUCATION: To improve coordination, kinesthesia, and posture. Hooklying TrA + alternating unilateral GTB hip ABD/ER bent-knee fallout 10 x 3 Bridge + GTB hip ABD isometric 10 x 3  MANUAL THERAPY: To promote normalized muscle tension and reduced pain utilizing connective tissue massage and therapeutic massage.  IASTM with foam roller to B lumbar paraspinals  SELF CARE: Provided education to prevent loss of gains achieved  with physical therapy. Provided instruction in self-STM techniques to lumbar paraspinals using foam roller on wall.   02/17/2024 THERAPEUTIC EXERCISE: To improve strength and endurance.  Demonstration, verbal and tactile cues throughout for technique. Nustep L5 x 10' UE/LE for endurance  SELF CARE: Provided education to prevent loss of gains achieved with physical therapy and to prevent future decline in function. HEP review: Supine SLR stretch w/ strap x 1' x 2 BLE Supine cross over piriformis stretch x 1' x 2 RLE FABER piriformis stretch x 1' x 2 LLE  NEUROMUSCULAR RE-EDUCATION: To improve posture and proprioception. Multifidus walkouts x 10 BLE Standing RTB pulls to side with core stabilization Step-ups with Ta's Seated ball rollouts BUE multi  directional x 2' Supine ball rollouts BLE x 1' w/ TA Supine ball S/S BLE x 1' w/ TA Supine bridging x 30 w/ TA Supine pelvic tilts   02/12/24 THERAPEUTIC EXERCISE: To improve flexibility.  Demonstration, verbal and tactile cues throughout for technique. Nustep L5x8min UE/LE- for endurance Trunk rotations in sitting x 5 B Seated flexion pball rollout 10x3; 5x Rotation B  NEUROMUSCULAR RE-EDUCATION: To improve postural awareness and core activtion Lat pull downs 15lb 3x10 Seated rows 25lb 2x10 low grips Farmer walk with 10lb kettle bell x 2 laps unilateral OHP 10lb B 2x10 Multifidus walkout RTB 5x10    02/10/24 THERAPEUTIC EXERCISE: To improve flexibility.  Demonstration, verbal and tactile cues throughout for technique. Nustep L4x55min UE/LE- for endurance NEUROMUSCULAR RE-EDUCATION: To improve coordination, kinesthesia, posture, and proprioception.  Seated ab sets orange pball 2x10 3 sec holds Seated pallof press RTB doubled 2x10 Seated rows RTB 2x10  Seated shoulder extension RTB 2x10 Standing heel/toe raise 10x3 B   02/03/2024 - Eval SELF CARE:  Reviewed eval findings and role of PT in addressing identified deficits as well as instruction in initial HEP (see below).   THERAPEUTIC EXERCISE: To improve flexibility.  Demonstration, verbal and tactile cues throughout for technique.  Supine and hooklying SKTC stretches x 15 each - deferred due to increased LBP R>L Hooklying KTOS piriformis stretch x 15 - deferred due to increased LBP R>L Hooklying LTR 2 x 10 - deferred due to increased LBP R>L Hooklying HS stretch with strap x 30 bil Supine ITB stretch with strap x 30 bil   PATIENT EDUCATION:  Education details: HEP update  Person educated: Patient Education method: Explanation, Demonstration, Verbal cues, and Handouts Education comprehension: verbalized understanding, returned demonstration, verbal cues required, and needs further education  HOME EXERCISE  PROGRAM: Access Code: [email or web address] URL: [email or web address] Date: 02/10/2024 Prepared by: Sol Gaskins  Exercises - Supine Hamstring Stretch with Strap  - 2 x daily - 7 x weekly - 3 reps - 30 sec hold - Supine Iliotibial Band Stretch with Strap  - 2 x daily - 7 x weekly - 3 reps - 30 sec hold - Standing Shoulder Row with Anchored Resistance  - 1 x daily - 7 x weekly - 3 sets - 10 reps - Shoulder extension with resistance - Neutral  - 1 x daily - 7 x weekly - 3 sets - 10 reps - Standing Anti-Rotation Press with Anchored Resistance  - 1 x daily - 7 x weekly - 3 sets - 10 reps   ASSESSMENT:  CLINICAL IMPRESSION: Erik Salazar reports increased pain from ESIs seems to be subsiding slightly but he has not felt any of the expected relief from the ESIs.  Continued with core and lumbopelvic stabilization and strengthening, continuing emphasis on trunk rotation as well  as progressing proximal LE strengthening with core stabilization, with patient noting slight increase in pain following exercises.  Some relief of pain achieved with MT incorporating foam roller to R>L lumbar paraspinals and glutes/piriformis.  Giving ongoing increased pain and limited response to ESIs, may consider trial of TENS unit and/or TPDN.  Erik Salazar will benefit from continued skilled PT to address ongoing ROM, flexibility and strength deficits to improve mobility and activity tolerance with decreased pain interference.    Eval: Erik Salazar is a 74 y.o. male who was referred to physical therapy for evaluation and treatment for acute on chronic LBP secondary to lumbar spondylolisthesis.  Patient reports onset of current midline low back pain beginning ~2 months ago without known MOI.  He is unable to isolate aggravating or relieving factors.  Patient has deficits in lumbar ROM, proximal LE flexibility, B LE strength, abnormal posture, and TTP with abnormal muscle tension  which are interfering with ADLs and are impacting quality  of life.  On Modified Oswestry patient scored 25/50 demonstrating 50% or severe disability.  Erik Salazar will benefit from skilled PT to address above deficits to improve mobility and activity tolerance with decreased pain interference.  Attempt at initial HEP creation limited by increased pain.  PT referral included orders for TPDN - patient made aware of recent change in rehab policy requiring OOP cash pay for TPDN at time of service in order to remain in regulatory and legal compliance with billing.  OBJECTIVE IMPAIRMENTS: decreased activity tolerance, decreased endurance, decreased knowledge of condition, decreased mobility, difficulty walking, decreased ROM, decreased strength, increased fascial restrictions, impaired perceived functional ability, increased muscle spasms, impaired flexibility, improper body mechanics, postural dysfunction, and pain.  ACTIVITY LIMITATIONS: carrying, lifting, bending, sitting, standing, squatting, sleeping, stairs, transfers, bed mobility, locomotion level, and caring for others   PARTICIPATION LIMITATIONS: meal prep, cleaning, laundry, driving, shopping, community activity, and yard work  PERSONAL FACTORS: Age, Fitness, Past/current experiences, Time since onset of injury/illness/exacerbation, and 3+ comorbidities: History chronic back pain, multiple back surgeries, ACDF, B TKA, RA and OA, DVT, CVA frontal lobe with memory deficits, seizures, open heart surgery to repair aneurism, CABG x 2, omental flap graft to mediastinum, hernia repair.  are also affecting patient's functional outcome.   REHAB POTENTIAL: Good  CLINICAL DECISION MAKING: Evolving/moderate complexity  EVALUATION COMPLEXITY: Moderate   GOALS: Goals reviewed with patient? Yes  SHORT TERM GOALS: Target date: 03/02/2024  Patient will be independent with initial HEP to improve outcomes and carryover.  Baseline: Partial initial HEP provided on eval but limited by pain Goal status: MET - 02/24/24    2.  Patient will report 25% improvement in low back pain to improve QOL. Baseline: 7/10 on eval, up to 10/10 at worst 02/24/24 - no significant change since eval Goal status: IN PROGRESS - 03/02/24 - pain increased since ESIs  LONG TERM GOALS: Target date: 03/30/2024  Patient will be independent with ongoing/advanced HEP for self-management at home.  Baseline:  Goal status: IN PROGRESS  2.  Patient will report 50-75% improvement in low back pain to improve QOL.  Baseline: 7/10 on eval, up to 10/10 at worst Goal status: IN PROGRESS  3.  Patient to demonstrate ability to achieve and maintain good spinal alignment/posturing and body mechanics needed for daily activities. Baseline:  Goal status: IN PROGRESS  4.  Patient will demonstrate functional pain free lumbar ROM to perform ADLs.   Baseline: Refer to above lumbar ROM table Goal status: IN PROGRESS- 02/27/24  5.  Patient will demonstrate improved B LE strength to >/= 4+/5 for improved stability and ease of mobility. Baseline: Refer to above LE MMT table Goal status: IN PROGRESS  6. Patient will report </= 38% on Modified Oswestry (MCID = 12%) to demonstrate improved functional ability with decreased pain interference. Baseline: 25 / 50 = 50.0 % Goal status: IN PROGRESS  7.  Patient will tolerate 20-30 min of sitting or standing w/o increased pain to allow for  improved mobility and activity tolerance. Baseline: Per modified Oswestry pain limits sitting >1/2-hour and standing >10 minutes Goal status: IN PROGRESS   PLAN:  PT FREQUENCY: 2x/week  PT DURATION: 6-8 weeks  PLANNED INTERVENTIONS: [id number]- PT Re-evaluation, 97750- Physical Performance Testing, 97110-Therapeutic exercises, 97530- Therapeutic activity, [id number]- Neuromuscular re-education, 97535- Self Care, [id number]- Manual therapy, [phone]- Gait training, [phone]- Aquatic Therapy, [phone]- Electrical stimulation (unattended), 97035- Ultrasound, 79439 (1-2 muscles), [phone] (3+  muscles)- Dry Needling, Patient/Family education, Balance training, Taping, Joint mobilization, Spinal mobilization, Cryotherapy, and Moist heat  PLAN FOR NEXT SESSION:  Incorporate more lumbar rotational and side bend motion gently; Continue machine weight training with core stabilization as patient is very interested in this;  Ball stabilization exercises.  Consider possible e-stim/TENS and/or TPDN (both discussed with patient but not attempted yet).       Elijah CHRISTELLA Hidden, PT 03/02/2024, 10:38 AM

## 2024-03-03 ENCOUNTER — Encounter: Payer: Self-pay | Admitting: Podiatry

## 2024-03-03 ENCOUNTER — Ambulatory Visit (INDEPENDENT_AMBULATORY_CARE_PROVIDER_SITE_OTHER): Admitting: Podiatry

## 2024-03-03 DIAGNOSIS — M7751 Other enthesopathy of right foot: Secondary | ICD-10-CM | POA: Diagnosis not present

## 2024-03-03 NOTE — Progress Notes (Signed)
 Presents for follow-up injection of bursa right foot.  Doing about 50 to 60% better.  Still been some pain could get the insoles in her shoes.   Physical exam:  General appearance: Pleasant, and in no acute distress. AOx3.  Vascular: Pedal pulses: DP 2/4 bilaterally, PT 2/4 bilaterally.  Mild edema lower legs bilaterally. Capillary fill time immediate bilaterally.  Neurological: Grossly intact bilaterally.    Dermatologic:   Skin normal temperature bilaterally.  Skin normal color, tone, and texture bilaterally.   Musculoskeletal: Tenderness at the base of the fifth metatarsal right.  No tenderness along peroneal brevis tendon right.  No tenderness in the fourth fifth met cuboid joint.    Diagnosis: 1.  Bursitis plantar lateral aspect right foot  Plan: -Dispensed horseshoe pads to help offload the base of the fifth metatarsal right.  -injected 3cc 2:1 mixture 0.5 cc Marcaine :Kenolog 10mg /76ml at bursitis plantar lateral aspect right foot at the base of the fifth metatarsal.     Return 2 weeks follow-up injection right

## 2024-03-06 ENCOUNTER — Ambulatory Visit: Admitting: Physical Therapy

## 2024-03-06 DIAGNOSIS — M5459 Other low back pain: Secondary | ICD-10-CM | POA: Diagnosis not present

## 2024-03-06 DIAGNOSIS — M6281 Muscle weakness (generalized): Secondary | ICD-10-CM

## 2024-03-06 DIAGNOSIS — M62830 Muscle spasm of back: Secondary | ICD-10-CM

## 2024-03-06 NOTE — Therapy (Signed)
 OUTPATIENT PHYSICAL THERAPY TREATMENT   Patient Name: Erik Salazar MRN: [id number] DOB:Jun 04, 1951, 73 y.o., male Today's Date: 03/06/2024  END OF SESSION:  PT End of Session - 03/06/24 0842     Visit Number 8    Date for PT Re-Evaluation 03/30/24    Authorization Type [employer]    PT Start Time [phone]    PT Stop Time 0926    PT Time Calculation (min) 43 min               Past Medical History:  Diagnosis Date   Allergy see attached info   Arthritis    RA and OA   Blood transfusion without reported diagnosis    with 2nd heart surgery   Carpal tunnel syndrome on right 07/08/2017   Cervical radiculopathy at C6 11/14/2017   Right   Complication of anesthesia    Coronary artery disease    Enlarged prostate    Fibromyalgia    H/O: knee surgery    15    Hayfever    WHEN YOUNG   History of blood clots    History of kidney stones    History of open heart surgery    ASCENDING AORTIC ANEURYSM   Ischemic stroke of frontal lobe (HCC) 03/14/2017   Bilateral; post-redo CT surgery   Pneumonia    PONV (postoperative nausea and vomiting)    only after CABG surgeries   Seizure disorder (HCC) 03/14/2017   Seizures (HCC)    last one 02/2021,updated 09/25/22   Status post knee surgery    DVT POST KNEE SURGERY   Stroke [facility])    Past Surgical History:  Procedure Laterality Date   ANTERIOR CERVICAL DECOMP/DISCECTOMY FUSION N/A 05/04/2020   Procedure: Anterior Cervical Decompression/Discectomy Fuion Cervical three-four, Cervical four-five, Cervical five-six;  Surgeon: Onetha Kuba, MD;  Location: [facility] OR;  Service: Neurosurgery;  Laterality: N/A;   BACK SURGERY     lower back 06/28/2021   BALLOON DILATION N/A 06/23/2019   Procedure: BALLOON DILATION;  Surgeon: Elicia Claw, MD;  Location: [facility]COPY;  Service: Gastroenterology;  Laterality: N/A;   BENTALL PROCEDURE  01/04/2016   Bentall with 23 mm pericardial AVR; SVG-LAD, SVG-CX [facility] [place])   BICEPS  TENDON REPAIR Right    BIOPSY  06/23/2019   Procedure: BIOPSY;  Surgeon: Elicia Claw, MD;  Location: [facility];  Service: Gastroenterology;;   CARDIAC SURGERY     ANUERSYM MAY 2017   Bentall procedure. Bioprosthetic aortic valve #23 mm bovine model #2700 TF ask, and 28 mm Gelweave woven vascular sinus of Valsalva graft   CARDIAC VALVE REPLACEMENT  02/2016, 02/2017   CARPAL TUNNEL RELEASE  08/2018   right hand    COLONOSCOPY WITH PROPOFOL  N/A 10/22/2022   Procedure: COLONOSCOPY WITH PROPOFOL ;  Surgeon: San Sandor GAILS, DO;  Location: [facility];  Service: Gastroenterology;  Laterality: N/A;   CORONARY ARTERY BYPASS GRAFT  01/04/2016   VG to LAD & VG to LCX   CORONARY ARTERY BYPASS GRAFT  03/13/2017   LIMA to LAD with steril abcess removal from dacron graft   CYSTOSCOPY/URETEROSCOPY/HOLMIUM LASER/STENT PLACEMENT Left 06/12/2022   Procedure: CYSTOSCOPY/URETEROSCOPY/HOLMIUM LASER/STENT PLACEMENT;  Surgeon: Carolee Sherwood JONETTA DOUGLAS, MD;  Location: [facility] ORS;  Service: Urology;  Laterality: Left;   ESOPHAGOGASTRODUODENOSCOPY     Had done dilatation done about 2 or 3 times before in [place]   ESOPHAGOGASTRODUODENOSCOPY (EGD) WITH PROPOFOL  N/A 06/23/2019   Procedure: ESOPHAGOGASTRODUODENOSCOPY (EGD) WITH PROPOFOL ;  Surgeon: Elicia Claw, MD;  Location:  [facility];  Service: Gastroenterology;  Laterality: N/A;   FALSE ANEURYSM REPAIR  03/13/2017   redo sternotomy, sterile abscess removal from Dacron graft, omental flap around aorta, CABG: LIMA-LAD ([name], Dr. Zachary Remington)   GREATER OMENTAL FLAP CLOSURE  2018   Of pericardium 2018   INSERTION OF MESH  10/14/2020   Procedure: INSERTION OF MESH;  Surgeon: Sheldon Standing, MD;  Location: [facility] OR;  Service: General;;   JOINT REPLACEMENT  knee   knee surgeries     13 surgeries on knee done before knee replacement    KNEE SURGERY  1983   LAPAROSCOPIC LYSIS OF ADHESIONS N/A 05/19/2021   Procedure: LYSIS OF ADHESIONS;  Surgeon: Sheldon Standing, MD;   Location: [facility] OR;  Service: General;  Laterality: N/A;  GEN & LOCAL   LYSIS OF ADHESION N/A 10/14/2020   Procedure: LYSIS OF ADHESION;  Surgeon: Sheldon Standing, MD;  Location: [facility] OR;  Service: General;  Laterality: N/A;   open heart surgery     03-13-2017   REPLACEMENT TOTAL KNEE Left 2015   ROTATOR CUFF REPAIR Right    done with biceps tendon repair   SPINE SURGERY  upper neck, lower spine   TONSILLECTOMY     removed as a child.   TOTAL KNEE ARTHROPLASTY Right 01/18/2022   Procedure: RIGHT TOTAL KNEE ARTHROPLASTY;  Surgeon: Jerri Kay HERO, MD;  Location: [facility] OR;  Service: Orthopedics;  Laterality: Right;   VENTRAL HERNIA REPAIR N/A 10/14/2020   Procedure: LAPAROSCOPIC VENTRAL HERNIA REPAIR WITH TAP BLOCK BILATERAL;  Surgeon: Sheldon Standing, MD;  Location: [facility] OR;  Service: General;  Laterality: N/A;   VENTRAL HERNIA REPAIR N/A 05/19/2021   Procedure: LAPAROSCOPIC VENTRAL WALL HERNIA REPAIR;  Surgeon: Sheldon Standing, MD;  Location[facility] OR;  Service: General;  Laterality: N/A;   Patient Active Problem List   Diagnosis Date Noted   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   Constipation 10/22/2022   Hypertrophied anal papilla 10/22/2022   Colon cancer screening 10/22/2022   Postop check 03/26/2022   Gait abnormality 01/29/2022   Status post total right knee replacement 01/18/2022   Coronary artery disease involving native coronary artery of native heart without angina pectoris 01/04/2022   Preop cardiovascular exam 01/04/2022   Primary osteoarthritis of right knee 01/04/2022   Acute right-sided low back pain without sciatica 12/25/2021   Aortic atherosclerosis (HCC) 09/04/2021   Spondylolisthesis at L4-L5 level 06/28/2021   S/P repair of ventral hernia 05/19/2021   S/P repair of recurrent ventral hernia 05/19/2021   Benign prostatic hyperplasia without lower urinary tract symptoms 10/14/2020   Chronic pain 10/14/2020   ED (erectile dysfunction) of organic origin  10/14/2020   Esophageal dysphagia 10/14/2020   Gastroesophageal reflux disease 10/14/2020   History of aortic valve replacement with bioprosthetic valve 10/14/2020   Hx of aortic aneurysm repair 10/14/2020   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   Thoracic aortic aneurysm without rupture (HCC) 10/14/2020   Recurrent incisional hernia 10/14/2020   Spinal stenosis in cervical region 05/04/2020   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   Cervical radiculopathy at C6 11/14/2017   Carpal tunnel syndrome on right 07/08/2017   Tremor, essential 07/08/2017   Ischemic stroke of frontal lobe (HCC) 04/05/2017   Pain in right hand 04/05/2017   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 03/27/2017   Seizure (HCC) 03/14/2017   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/13/2017   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HCC) 03/12/2017   Essential hypertension 02/26/2017   Mixed hyperlipidemia  02/26/2017    PCP: Jason Leita Repine, FNP   REFERRING PROVIDER: Onetha Kuba, MD   REFERRING DIAG: [phone] (ICD-10-CM) - Spondylolisthesis, lumbar region   THERAPY DIAG:  Other low back pain  Muscle spasm of back  Muscle weakness (generalized)  RATIONALE FOR EVALUATION AND TREATMENT: Rehabilitation  ONSET DATE: Acute on chronic x ~2 months  NEXT MD VISIT: ESI 02/25/2024; MD f/u ~03/17/2024   SUBJECTIVE:                                                                                                                                                                                                         SUBJECTIVE STATEMENT: Pt is still experiencing pain since last visit, causing him to have much difficulty sleeping last night. Pt also says he did lots of walking yesterday. Expressed wanting to try TENS today to see if he will have any relief with it.   PAIN: Are you having pain? Yes: NPRS scale: 6/10   Pain location: Midline lumbar spine  Pain description: sharp  Aggravating factors: almost everything  Relieving  factors: not doing anything, elevating his legs while sitting   PERTINENT HISTORY:  History chronic back pain, multiple back surgeries, ACDF, B TKA, RA and OA, DVT, CVA frontal lobe with memory deficits, seizures, open heart surgery to repair aneurism, CABG x 2, omental flap graft to mediastinum, hernia repair.   PRECAUTIONS: None  RED FLAGS: None  WEIGHT BEARING RESTRICTIONS: No  FALLS:  Has patient fallen in last 6 months? No  LIVING ENVIRONMENT:  Lives with: lives with their spouse Lives in: House/apartment Stairs: No Has following equipment at home: Single point cane, Walker - 2 wheeled, and bed side commode  OCCUPATION: Retired  PLOF: Independent and Leisure: exercises frequently - walk treadmill    PATIENT GOALS: Take away the pain.   OBJECTIVE: (objective measures completed at initial evaluation unless otherwise dated)  DIAGNOSTIC FINDINGS:  01/26/24 - MR Lumbar Spine: Results still pending as of 02/03/2024   04/08/21 - MR Lumbar Spine:  IMPRESSION: No change is appreciated compared to the study of last year.   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 Pseudo disc herniation. Bilateral foraminal stenosis could possibly affect either or both exiting L5 nerves.   L3-4: Mild multifactorial stenosis secondary to bulging of the disc and mild facet and ligamentous prominence. No visible neural compression.   Other levels show disc bulges and facet degeneration but no apparent neural compressive stenosis.  PATIENT SURVEYS:  Modified Oswestry 25 / 50 = 50.0 %, severe disability  SCREENING FOR RED FLAGS: Bowel or bladder incontinence: No Spinal tumors: No Cauda equina syndrome: No Compression fracture: No Abdominal aneurysm: No  COGNITION:  Overall cognitive status: Impaired - STM   SENSATION: WFL Numbness in hands from prior CVA  POSTURE:  decreased lumbar lordosis  PALPATION: TTP midline lumbar spine and over prior surgical  incision. Increased muscle tension in B lumbar paraspinals but denies TTP.  LUMBAR ROM:   Active  Eval - p! w/ all motions 02/27/24  Flexion Hands to knees Hands to ankles  Extension 60% limited 50% limited  Right lateral flexion Hand to lateral knee Lateral knee  Left lateral flexion Hand to lateral knee Lateral knee  Right rotation 40% limited   Left rotation 40% limited   (Blank rows = not tested)  MUSCLE LENGTH: Hamstrings: mod tight R, mild tight L ITB: mod tight R, mild tight L Piriformis: mod/severe tight B Hip flexors: mod tight B Quads: mod tight B Heelcord:   LOWER EXTREMITY ROM:    Limited d/t h/o TKA and low back pain   LOWER EXTREMITY MMT:    MMT Right eval Left eval  Hip flexion 4 4+  Hip extension 4- 4  Hip abduction 4 4+  Hip adduction 4+ 4+  Hip internal rotation 4- p! Lateral knee 4+  Hip external rotation 4- p! Lateral knee 4  Knee flexion 4+ 5  Knee extension 4+ 5  Ankle dorsiflexion 3+ 3+  Ankle plantarflexion    Ankle inversion    Ankle eversion     (Blank rows = not tested)  LUMBAR SPECIAL TESTS:  Straight leg raise test: Positive    TODAY'S TREATMENT:   03/06/24:  THERAPEUTIC EXERCISE: To improve strength, endurance, and flexibility. Rec Bike- L3 x 10 mins BATCA-Seated Rows 35#,2x10 BATCA-Seated Pull-downs 15#, 2x10  BATCA- Leg Press 35#, 2x10-removed TENS unit after  NEUROMUSCULAR RE-EDUCATION: To improve coordination, kinesthesia, posture, and proprioception (all w/ TENS unit on lower back)  3 way [employer] w/ 30s hold Seated Marches on flat table (TENS)  Seated marches on dynadisc w/ feet on 4 step  Seated marches on dynadisc w/ feet on 4 step + alternating opposite arm raises  Seated diagonals on dynadisc w/ yellow medball x10 B UE  MANUAL THERAPY: To promote normalized muscle tension, improved flexibility, and pain modulation utilizing connective tissue massage, therapeutic massage, and manual TP therapy.  IASTM with foam  roller to R>L lumbar paraspinals, glutes, piriformis   MODALITIES:  TENS:  pt was shown how to use the unit & set the intensity at home if he were to purchase his own, used while doing exercises today.   03/02/24 THERAPEUTIC EXERCISE: To improve strength, endurance, and flexibility.   Rec Bike - L3 x 10 min  NEUROMUSCULAR RE-EDUCATION: To improve coordination, kinesthesia, posture, and proprioception. Seated yellow med ball diagonals x 20 bil Standing GTB pallof press x 10 bil Standing GTB trunk rotation x 10 bil Standing GTB 4-way SLR x 10 each bil, intermittent UE support on back of chair for balance  MANUAL THERAPY: To promote normalized muscle tension, improved flexibility, and pain modulation utilizing connective tissue massage, therapeutic massage, and manual TP therapy.  STM/DTM and IASTM with foam roller to R>L lumbar paraspinals and glutes/piriformis   02/27/24 NEUROMUSCULAR RE-EDUCATION: To improve kinesthesia, posture, and proprioception.  Hooklying clam blue TB + TrA 2x10 Bridge with orange pball x 10- mild pain KTC w/ orange pball x 20 Seated rows 35lb high grips x 10 chest  support; x 10 no chest support Standing trunk rotation GTB x 20 B  THERAPEUTIC EXERCISE: To improve strength, endurance, ROM, and flexibility. Recumbent Bike L3x10 min  MANUAL THERAPY: To promote normalized muscle tension and for pain modulation  IASTM with foam roll to B lumbar paraspinals   02/24/2024 THERAPEUTIC EXERCISE: To improve strength, endurance, and flexibility.  Demonstration, verbal and tactile cues throughout for technique.  Rec Bike - L2 x 10 min Hooklying HS stretch with strap 3 x 30 bil Supine ITB stretch with strap 3 x 30 bil Supine bent knee cross-body glute stretch - deferred d/t increased LBP  NEUROMUSCULAR RE-EDUCATION: To improve coordination, kinesthesia, and posture. Hooklying TrA + alternating unilateral GTB hip ABD/ER bent-knee fallout 10 x 3 Bridge + GTB hip ABD  isometric 10 x 3  MANUAL THERAPY: To promote normalized muscle tension and reduced pain utilizing connective tissue massage and therapeutic massage.  IASTM with foam roller to B lumbar paraspinals  SELF CARE: Provided education to prevent loss of gains achieved with physical therapy. Provided instruction in self-STM techniques to lumbar paraspinals using foam roller on wall.   02/17/2024 THERAPEUTIC EXERCISE: To improve strength and endurance.  Demonstration, verbal and tactile cues throughout for technique. Nustep L5 x 10' UE/LE for endurance  SELF CARE: Provided education to prevent loss of gains achieved with physical therapy and to prevent future decline in function. HEP review: Supine SLR stretch w/ strap x 1' x 2 BLE Supine cross over piriformis stretch x 1' x 2 RLE FABER piriformis stretch x 1' x 2 LLE  NEUROMUSCULAR RE-EDUCATION: To improve posture and proprioception. Multifidus walkouts x 10 BLE Standing RTB pulls to side with core stabilization Step-ups with Ta's Seated ball rollouts BUE multi directional x 2' Supine ball rollouts BLE x 1' w/ TA Supine ball S/S BLE x 1' w/ TA Supine bridging x 30 w/ TA Supine pelvic tilts   02/12/24 THERAPEUTIC EXERCISE: To improve flexibility.  Demonstration, verbal and tactile cues throughout for technique. Nustep L5x8min UE/LE- for endurance Trunk rotations in sitting x 5 B Seated flexion pball rollout 10x3; 5x Rotation B  NEUROMUSCULAR RE-EDUCATION: To improve postural awareness and core activtion Lat pull downs 15lb 3x10 Seated rows 25lb 2x10 low grips Farmer walk with 10lb kettle bell x 2 laps unilateral OHP 10lb B 2x10 Multifidus walkout RTB 5x10    02/10/24 THERAPEUTIC EXERCISE: To improve flexibility.  Demonstration, verbal and tactile cues throughout for technique. Nustep L4x21min UE/LE- for endurance NEUROMUSCULAR RE-EDUCATION: To improve coordination, kinesthesia, posture, and proprioception.  Seated ab sets  orange pball 2x10 3 sec holds Seated pallof press RTB doubled 2x10 Seated rows RTB 2x10  Seated shoulder extension RTB 2x10 Standing heel/toe raise 10x3 B   02/03/2024 - Eval SELF CARE:  Reviewed eval findings and role of PT in addressing identified deficits as well as instruction in initial HEP (see below).   THERAPEUTIC EXERCISE: To improve flexibility.  Demonstration, verbal and tactile cues throughout for technique.  Supine and hooklying SKTC stretches x 15 each - deferred due to increased LBP R>L Hooklying KTOS piriformis stretch x 15 - deferred due to increased LBP R>L Hooklying LTR 2 x 10 - deferred due to increased LBP R>L Hooklying HS stretch with strap x 30 bil Supine ITB stretch with strap x 30 bil   PATIENT EDUCATION:  Education details: HEP update  Person educated: Patient Education method: Explanation, Demonstration, Verbal cues, and Handouts Education comprehension: verbalized understanding, returned demonstration, verbal cues required, and needs further education  HOME EXERCISE PROGRAM: Access Code: [email or web address] URL: [email or web address] Date: 02/10/2024 Prepared by: Sol Gaskins  Exercises - Supine Hamstring Stretch with Strap  - 2 x daily - 7 x weekly - 3 reps - 30 sec hold - Supine Iliotibial Band Stretch with Strap  - 2 x daily - 7 x weekly - 3 reps - 30 sec hold - Standing Shoulder Row with Anchored Resistance  - 1 x daily - 7 x weekly - 3 sets - 10 reps - Shoulder extension with resistance - Neutral  - 1 x daily - 7 x weekly - 3 sets - 10 reps - Standing Anti-Rotation Press with Anchored Resistance  - 1 x daily - 7 x weekly - 3 sets - 10 reps   ASSESSMENT:  CLINICAL IMPRESSION: Erik Salazar reports experiencing continued pain since ESIs and having done more walking yesterday. His pain caused him to be unable to sleep well through last night. He reported a 6/10 pain and agreed to try a TENS unit on his lower back while doing his exercises for  today. Pt was able to perform all activities in today's session but, reported that the TENS unit did not make his pain any better or worse. Continued to work on LE strengthening, core and lumbopelvic stabilization still incorporating trunk rotation components today. Pt felt some relief with MT using a foam roller to R>L lumbar paraspinals, glutes, and piriformis again today. Erik Salazar expressed wanting to give TENS another try on his next visit to see if it helps at all. Pt will continue to benefit from skilled PT intervention to address his deficits in ROM, strength, flexibility in order to improve mobility and activity tolerance w/ dec'd pain.    Eval: Erik Salazar is a 73 y.o. male who was referred to physical therapy for evaluation and treatment for acute on chronic LBP secondary to lumbar spondylolisthesis.  Patient reports onset of current midline low back pain beginning ~2 months ago without known MOI.  He is unable to isolate aggravating or relieving factors.  Patient has deficits in lumbar ROM, proximal LE flexibility, B LE strength, abnormal posture, and TTP with abnormal muscle tension  which are interfering with ADLs and are impacting quality of life.  On Modified Oswestry patient scored 25/50 demonstrating 50% or severe disability.  [place] will benefit from skilled PT to address above deficits to improve mobility and activity tolerance with decreased pain interference.  Attempt at initial HEP creation limited by increased pain.  PT referral included orders for TPDN - patient made aware of recent change in rehab policy requiring OOP cash pay for TPDN at time of service in order to remain in regulatory and legal compliance with billing.  OBJECTIVE IMPAIRMENTS: decreased activity tolerance, decreased endurance, decreased knowledge of condition, decreased mobility, difficulty walking, decreased ROM, decreased strength, increased fascial restrictions, impaired perceived functional ability, increased  muscle spasms, impaired flexibility, improper body mechanics, postural dysfunction, and pain.  ACTIVITY LIMITATIONS: carrying, lifting, bending, sitting, standing, squatting, sleeping, stairs, transfers, bed mobility, locomotion level, and caring for others   PARTICIPATION LIMITATIONS: meal prep, cleaning, laundry, driving, shopping, community activity, and yard work  PERSONAL FACTORS: Age, Fitness, Past/current experiences, Time since onset of injury/illness/exacerbation, and 3+ comorbidities: History chronic back pain, multiple back surgeries, ACDF, B TKA, RA and OA, DVT, CVA frontal lobe with memory deficits, seizures, open heart surgery to repair aneurism, CABG x 2, omental flap graft to mediastinum, hernia repair.  are also affecting patient's functional outcome.   REHAB POTENTIAL: Good  CLINICAL DECISION MAKING: Evolving/moderate complexity  EVALUATION COMPLEXITY: Moderate   GOALS: Goals reviewed with patient? Yes  SHORT TERM GOALS: Target date: 03/02/2024  Patient will be independent with initial HEP to improve outcomes and carryover.  Baseline: Partial initial HEP provided on eval but limited by pain Goal status: MET - 02/24/24   2.  Patient will report 25% improvement in low back pain to improve QOL. Baseline: 7/10 on eval, up to 10/10 at worst 02/24/24 - no significant change since eval Goal status: MET - 03/06/24: pt reports 25% improvement in LBP  03/02/24 - pain increased since ESIs  LONG TERM GOALS: Target date: 03/30/2024  Patient will be independent with ongoing/advanced HEP for self-management at home.  Baseline:  Goal status: IN PROGRESS  2.  Patient will report 50-75% improvement in low back pain to improve QOL.  Baseline: 7/10 on eval, up to 10/10 at worst Goal status: IN PROGRESS  3.  Patient to demonstrate ability to achieve and maintain good spinal alignment/posturing and body mechanics needed for daily activities. Baseline:  Goal status: IN PROGRESS  4.   Patient will demonstrate functional pain free lumbar ROM to perform ADLs.   Baseline: Refer to above lumbar ROM table Goal status: IN PROGRESS- 02/27/24  5.  Patient will demonstrate improved B LE strength to >/= 4+/5 for improved stability and ease of mobility. Baseline: Refer to above LE MMT table Goal status: IN PROGRESS  6. Patient will report </= 38% on Modified Oswestry (MCID = 12%) to demonstrate improved functional ability with decreased pain interference. Baseline: 25 / 50 = 50.0 % Goal status: IN PROGRESS  7.  Patient will tolerate 20-30 min of sitting or standing w/o increased pain to allow for  improved mobility and activity tolerance. Baseline: Per modified Oswestry pain limits sitting >1/2-hour and standing >10 minutes Goal status: IN PROGRESS   PLAN:  PT FREQUENCY: 2x/week  PT DURATION: 6-8 weeks  PLANNED INTERVENTIONS: [id number]- PT Re-evaluation, 97750- Physical Performance Testing, 97110-Therapeutic exercises, 97530- Therapeutic activity, [id number]- Neuromuscular re-education, 97535- Self Care, [id number]- Manual therapy, [id number]- Gait training, [phone]- Aquatic Therapy, [phone]- Electrical stimulation (unattended), 97035- Ultrasound, 79439 (1-2 muscles), 20561 (3+ muscles)- Dry Needling, Patient/Family education, Balance training, Taping, Joint mobilization, Spinal mobilization, Cryotherapy, and Moist heat  PLAN FOR NEXT SESSION:  Incorporate more lumbar rotational and side bend motion gently; Continue machine weight training with core stabilization;  Ball stabilization exercises.  Consider possible e-stim/TENS and/or TPDN (both discussed with patient, pt tried TENS-would like to try again).       Maryclare Nydam Jerrye, Student-PT 03/06/2024, 12:15 PM

## 2024-03-09 ENCOUNTER — Ambulatory Visit: Attending: Cardiology | Admitting: Cardiology

## 2024-03-09 ENCOUNTER — Encounter: Payer: Self-pay | Admitting: Cardiology

## 2024-03-09 VITALS — BP 104/66 | HR 77 | Ht 70.0 in | Wt 181.8 lb

## 2024-03-09 DIAGNOSIS — Z9889 Other specified postprocedural states: Secondary | ICD-10-CM

## 2024-03-09 DIAGNOSIS — Z8679 Personal history of other diseases of the circulatory system: Secondary | ICD-10-CM

## 2024-03-09 DIAGNOSIS — Z789 Other specified health status: Secondary | ICD-10-CM

## 2024-03-09 DIAGNOSIS — E782 Mixed hyperlipidemia: Secondary | ICD-10-CM

## 2024-03-09 DIAGNOSIS — I639 Cerebral infarction, unspecified: Secondary | ICD-10-CM

## 2024-03-09 DIAGNOSIS — I2510 Atherosclerotic heart disease of native coronary artery without angina pectoris: Secondary | ICD-10-CM

## 2024-03-09 NOTE — Progress Notes (Signed)
 Cardiology Office Note:    Date:  03/09/2024   ID:  Erik Salazar, DOB 1950/08/31, MRN [id number]  PCP:  Jason Leita Repine, FNP   [facility] Providers Cardiologist:  Oneil Parchment, MD     Referring MD: Jason Leita Repine,*     History of Present Illness:    Erik Salazar is a 73 y.o. male with a hx of Bentall procedure here for follow-up.  Also seen by Dr. Obadiah over the last few years to monitor aorta.  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  This could be a thrombosed vein graft.  Currently on inclisiran for hyperlipidemia.  Injectable.  LDL 47.   Still having seizure activity.  Cannot drive.  Seeing neurology.  Notes reviewed.  Short-term memory loss.  Bentall procedure was done at [facility].  Past Medical History:  Diagnosis Date   Allergy see attached info   Arthritis    RA and OA   Blood transfusion without reported diagnosis    with 2nd heart surgery   Carpal tunnel syndrome on right 07/08/2017   Cervical radiculopathy at C6 11/14/2017   Right   Complication of anesthesia    Coronary artery disease    Enlarged prostate    Fibromyalgia    H/O: knee surgery    15    Hayfever    WHEN YOUNG   History of blood clots    History of kidney stones    History of open heart surgery    ASCENDING AORTIC ANEURYSM   Ischemic stroke of frontal lobe (HCC) 03/14/2017   Bilateral; post-redo CT surgery   Pneumonia    PONV (postoperative nausea and vomiting)    only after CABG surgeries   Seizure disorder (HCC) 03/14/2017   Seizures (HCC)    last one 02/2021,updated 09/25/22   Status post knee surgery    DVT POST KNEE SURGERY   Stroke [facility])     Past Surgical History:  Procedure Laterality Date   ANTERIOR CERVICAL DECOMP/DISCECTOMY FUSION N/A 05/04/2020   Procedure: Anterior Cervical Decompression/Discectomy Fuion Cervical three-four, Cervical four-five, Cervical five-six;  Surgeon: Onetha Kuba, MD;  Location: [facility] OR;  Service:  Neurosurgery;  Laterality: N/A;   BACK SURGERY     lower back 06/28/2021   BALLOON DILATION N/A 06/23/2019   Procedure: BALLOON DILATION;  Surgeon: Elicia Claw, MD;  Location: [facility] ENDOSCOPY;  Service: Gastroenterology;  Laterality: N/A;   BENTALL PROCEDURE  01/04/2016   Bentall with 23 mm pericardial AVR; SVG-LAD, SVG-CX [facility] [place])   BICEPS TENDON REPAIR Right    BIOPSY  06/23/2019   Procedure: BIOPSY;  Surgeon: Elicia Claw, MD;  Location: [facility] ENDOSCOPY;  Service: Gastroenterology;;   CARDIAC SURGERY     ANUERSYM MAY 2017   Bentall procedure. Bioprosthetic aortic valve #23 mm bovine model #2700 TF ask, and 28 mm Gelweave woven vascular sinus of Valsalva graft   CARDIAC VALVE REPLACEMENT  02/2016, 02/2017   CARPAL TUNNEL RELEASE  08/2018   right hand    COLONOSCOPY WITH PROPOFOL  N/A 10/22/2022   Procedure: COLONOSCOPY WITH PROPOFOL ;  Surgeon: San Sandor GAILS, DO;  Location: [facility];  Service: Gastroenterology;  Laterality: N/A;   CORONARY ARTERY BYPASS GRAFT  01/04/2016   VG to LAD & VG to LCX   CORONARY ARTERY BYPASS GRAFT  03/13/2017   LIMA to LAD with steril abcess removal from dacron graft   CYSTOSCOPY/URETEROSCOPY/HOLMIUM LASER/STENT PLACEMENT Left 06/12/2022   Procedure: CYSTOSCOPY/URETEROSCOPY/HOLMIUM LASER/STENT PLACEMENT;  Surgeon: Carolee,  Sherwood JONETTA MOULD, MD;  Location: [facility] ORS;  Service: Urology;  Laterality: Left;   ESOPHAGOGASTRODUODENOSCOPY     Had done dilatation done about 2 or 3 times before in [place]   ESOPHAGOGASTRODUODENOSCOPY (EGD) WITH PROPOFOL  N/A 06/23/2019   Procedure: ESOPHAGOGASTRODUODENOSCOPY (EGD) WITH PROPOFOL ;  Surgeon: Elicia Claw, MD;  Location: [facility];  Service: Gastroenterology;  Laterality: N/A;   FALSE ANEURYSM REPAIR  03/13/2017   redo sternotomy, sterile abscess removal from Dacron graft, omental flap around aorta, CABG: LIMA-LAD ([name], Dr. Zachary Remington)   GREATER OMENTAL FLAP CLOSURE  2018   Of pericardium  2018   INSERTION OF MESH  10/14/2020   Procedure: INSERTION OF MESH;  Surgeon: Sheldon Standing, MD;  Location: [facility] OR;  Service: General;;   JOINT REPLACEMENT  knee   knee surgeries     13 surgeries on knee done before knee replacement    KNEE SURGERY  1983   LAPAROSCOPIC LYSIS OF ADHESIONS N/A 05/19/2021   Procedure: LYSIS OF ADHESIONS;  Surgeon: Sheldon Standing, MD;  Location: [facility] OR;  Service: General;  Laterality: N/A;  GEN & LOCAL   LYSIS OF ADHESION N/A 10/14/2020   Procedure: LYSIS OF ADHESION;  Surgeon: Sheldon Standing, MD;  Location: [facility] OR;  Service: General;  Laterality: N/A;   open heart surgery     03-13-2017   REPLACEMENT TOTAL KNEE Left 2015   ROTATOR CUFF REPAIR Right    done with biceps tendon repair   SPINE SURGERY  upper neck, lower spine   TONSILLECTOMY     removed as a child.   TOTAL KNEE ARTHROPLASTY Right 01/18/2022   Procedure: RIGHT TOTAL KNEE ARTHROPLASTY;  Surgeon: Jerri Kay HERO, MD;  Location: [facility] OR;  Service: Orthopedics;  Laterality: Right;   VENTRAL HERNIA REPAIR N/A 10/14/2020   Procedure: LAPAROSCOPIC VENTRAL HERNIA REPAIR WITH TAP BLOCK BILATERAL;  Surgeon: Sheldon Standing, MD;  Location: [facility] OR;  Service: General;  Laterality: N/A;   VENTRAL HERNIA REPAIR N/A 05/19/2021   Procedure: LAPAROSCOPIC VENTRAL WALL HERNIA REPAIR;  Surgeon: Sheldon Standing, MD;  Location: [facility] OR;  Service: General;  Laterality: N/A;    Current Medications: Current Meds  Medication Sig   amoxicillin  (AMOXIL ) 500 MG capsule Take 4 capsules (2,000 mg total) by mouth 1 hour prior to dental appointment.   ascorbic acid  (VITAMIN C) 500 MG tablet Take 500 mg by mouth daily.   aspirin  EC 81 MG tablet Take 81 mg by mouth daily. Swallow whole.   inclisiran (LEQVIO ) 284 MG/1.5ML SOSY injection Inject 284 mg into the skin every 6 (six) months.   levETIRAcetam  (KEPPRA ) 750 MG tablet Take 2 tablets (1,500 mg total) by mouth 2 (two) times daily. (Patient taking differently: Take 1,000 mg by mouth 2  (two) times daily.)   lubiprostone  (AMITIZA ) 8 MCG capsule Take 1 capsule (8 mcg total) by mouth 2 (two) times daily with a meal. Patient needs follow up appointment for future refills. Please call [phone] to schedule an appointment.   metoprolol  succinate (TOPROL -XL) 25 MG 24 hr tablet Take 1/2 tablet (12.5 mg total) by mouth daily. With or immediately following a meal.   Multiple Vitamins-Minerals (MULTIVITAMIN WITH MINERALS) tablet Take 1 tablet by mouth daily.   pravastatin  (PRAVACHOL ) 40 MG tablet Take 1 tablet (40 mg total) by mouth every evening.   tamsulosin  (FLOMAX ) 0.4 MG CAPS capsule Take 2 capsules (0.8 mg total) by mouth daily.     Allergies:   Oxycodone hcl, Zetia  [ezetimibe ], Augmentin [amoxicillin -pot clavulanate], Chlorhexidine , Elemental  sulfur, Oxytocin, and Sulfur   Social History   Socioeconomic History   Marital status: Married    Spouse name: Not on file   Number of children: 3   Years of education: Not on file   Highest education level: Some college, no degree  Occupational History   Occupation: RETIRED  Tobacco Use   Smoking status: Former    Types: Cigars    Quit date: 2021    Years since quitting: 4.5   Smokeless tobacco: Never   Tobacco comments:    Only smoked cigars for 6 months  Vaping Use   Vaping status: Never Used  Substance and Sexual Activity   Alcohol use: Yes    Alcohol/week: 1.0 standard drink of alcohol    Types: 1 Cans of beer per week    Comment: occassionally   Drug use: No   Sexual activity: Not on file  Other Topics Concern   Not on file  Social History Narrative   Lives at home with wife, is retired.  Education: 2 yrs college.   2 Children.   Social Drivers of Corporate investment banker Strain: Low Risk  (01/15/2024)   Overall Financial Resource Strain (CARDIA)    Difficulty of Paying Living Expenses: Not hard at all  Food Insecurity: No Food Insecurity (01/15/2024)   Hunger Vital Sign    Worried About Running Out of  Food in the Last Year: Never true    Ran Out of Food in the Last Year: Never true  Transportation Needs: No Transportation Needs (01/15/2024)   PRAPARE - Administrator, Civil Service (Medical): No    Lack of Transportation (Non-Medical): No  Physical Activity: Sufficiently Active (01/15/2024)   Exercise Vital Sign    Days of Exercise per Week: 5 days    Minutes of Exercise per Session: 60 min  Recent Concern: Physical Activity - Insufficiently Active (11/11/2023)   Exercise Vital Sign    Days of Exercise per Week: 3 days    Minutes of Exercise per Session: 30 min  Stress: No Stress Concern Present (01/15/2024)   [employer] of Occupational Health - Occupational Stress Questionnaire    Feeling of Stress : Not at all  Social Connections: Unknown (01/15/2024)   Social Connection and Isolation Panel    Frequency of Communication with Friends and Family: Twice a week    Frequency of Social Gatherings with Friends and Family: Twice a week    Attends Religious Services: Patient declined    Database administrator or Organizations: No    Attends Engineer, structural: 1 to 4 times per year    Marital Status: Married     Family History: The patient's family history includes Aneurysm in his mother; Arthritis in his father and mother; Ulcerative colitis in his mother and sister. There is no history of Colon cancer, Esophageal cancer, Colon polyps, Crohn's disease, Rectal cancer, or Stomach cancer.  ROS:   Please see the history of present illness.    No fevers no chills.  All other systems reviewed and are negative.  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      Recent Labs: No results found for requested labs within last 365 days.  Recent Lipid Panel     Component Value Date/Time   CHOL  117 11/18/2023 0756   TRIG 163 (H) 11/18/2023 0756   HDL 43 11/18/2023 0756   CHOLHDL 2.7 11/18/2023 0756   LDLCALC 47 11/18/2023 0756     Risk Assessment/Calculations:               Physical Exam:    VS:  BP 104/66   Pulse 77   Ht 5' 10 (1.778 m)   Wt 181 lb 12.8 oz (82.5 kg)   SpO2 93%   BMI 26.09 kg/m     Wt Readings from Last 3 Encounters:  03/09/24 181 lb 12.8 oz (82.5 kg)  01/15/24 177 lb (80.3 kg)  12/31/23 181 lb 8 oz (82.3 kg)     GEN:  Well nourished, well developed in no acute distress HEENT: Normal NECK: No JVD; No carotid bruits LYMPHATICS: No lymphadenopathy CARDIAC: RRR, no murmurs, rubs, gallops RESPIRATORY:  Clear to auscultation without rales, wheezing or rhonchi  ABDOMEN: Soft, non-tender, non-distended MUSCULOSKELETAL:  No edema; No deformity  SKIN: Warm and dry NEUROLOGIC:  Alert and oriented x 3 PSYCHIATRIC:  Normal affect   ASSESSMENT:    1. Coronary artery disease involving native coronary artery of native heart without angina pectoris   2. Mixed hyperlipidemia   3. Statin intolerance   4. Hx of aortic aneurysm repair   5. Ischemic stroke of frontal lobe (HCC)    PLAN:    In order of problems listed above:  Bentall procedure status post severe aortic regurgitation -Stable, no shortness of breath doing well.  Continue with Mediterranean diet, exercise.  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  This would be seen on a cardiac catheterization for instance.  I do not think that this is an actual aneurysm.  He is seeing Dr. Lucas soon.  Nonetheless, continue with current management.  Previously went back to [facility] for a relook, fluid collection was noted, drained but no evidence of aneurysm was noted.  Seizures - Per neurology.  Frustrating.  Cannot drive.  Hyperlipidemia - Doing excellent on both pravastatin  and  inclisiran.  LDL at goal  Coronary artery disease - Previous he describes mild nonobstructive CAD on catheterization.  2017 cath report reviewed in epic from outside hospital.  This is the probable reason why vein graft thrombosed.  Prior ascending aortic aneurysm - Previously approximately 5 cm, corrected with Bentall.  Prior bilateral strokes - [facility] for seizures       1 year follow-up   Medication Adjustments/Labs and Tests Ordered: Current medicines are reviewed at length with the patient today.  Concerns regarding medicines are outlined above.  Orders Placed This Encounter  Procedures   EKG 12-Lead   No orders of the defined types were placed in this encounter.   Patient Instructions  Medication Instructions:  Your physician recommends that you continue on your current medications as directed. Please refer to the Current Medication list given to you today.  *If you need a refill on your cardiac medications before your next appointment, please call your pharmacy*  Lab Work: None ordered.  If you have labs (blood work) drawn today and your tests are completely normal, you will receive your results only by: MyChart Message (if you have MyChart) OR A paper copy in the mail If you have any lab test that is abnormal or we need to change your treatment, we will call you to review the results.  Testing/Procedures: None ordered.  Follow-Up: At [facility], you and your health needs are our priority.  As part of our continuing mission to provide you with exceptional heart care, our providers are all part of one team.  This team includes your primary Cardiologist (physician) and Advanced Practice Providers or APPs (Physician Assistants and Nurse Practitioners) who all work together to provide you with the care you need, when you need it.  Your next appointment:   12 months with Dr Jeffrie       Signed, Oneil Jeffrie, MD  03/09/2024 8:45 AM    [facility]

## 2024-03-09 NOTE — Patient Instructions (Signed)
 Medication Instructions:  Your physician recommends that you continue on your current medications as directed. Please refer to the Current Medication list given to you today.  *If you need a refill on your cardiac medications before your next appointment, please call your pharmacy*  Lab Work: None ordered.  If you have labs (blood work) drawn today and your tests are completely normal, you will receive your results only by: MyChart Message (if you have MyChart) OR A paper copy in the mail If you have any lab test that is abnormal or we need to change your treatment, we will call you to review the results.  Testing/Procedures: None ordered.   Follow-Up: At [facility], you and your health needs are our priority.  As part of our continuing mission to provide you with exceptional heart care, our providers are all part of one team.  This team includes your primary Cardiologist (physician) and Advanced Practice Providers or APPs (Physician Assistants and Nurse Practitioners) who all work together to provide you with the care you need, when you need it.  Your next appointment:   12 months with Dr Renna Cary

## 2024-03-11 ENCOUNTER — Ambulatory Visit: Admitting: Physical Therapy

## 2024-03-13 ENCOUNTER — Ambulatory Visit

## 2024-03-13 DIAGNOSIS — M6281 Muscle weakness (generalized): Secondary | ICD-10-CM

## 2024-03-13 DIAGNOSIS — M5459 Other low back pain: Secondary | ICD-10-CM | POA: Diagnosis not present

## 2024-03-13 DIAGNOSIS — M62830 Muscle spasm of back: Secondary | ICD-10-CM

## 2024-03-13 NOTE — Therapy (Signed)
 OUTPATIENT PHYSICAL THERAPY TREATMENT   Patient Name: Erik Salazar MRN: [id number] DOB:October 19, 1950, 73 y.o., male Today's Date: 03/13/2024  END OF SESSION:  PT End of Session - 03/13/24 0856     Visit Number 9    Date for PT Re-Evaluation 03/30/24    Authorization Type [employer]    PT Start Time 0848    PT Stop Time 0931    PT Time Calculation (min) 43 min    Activity Tolerance Patient tolerated treatment well    Behavior During Therapy [facility] for tasks assessed/performed                Past Medical History:  Diagnosis Date   Allergy see attached info   Arthritis    RA and OA   Blood transfusion without reported diagnosis    with 2nd heart surgery   Carpal tunnel syndrome on right 07/08/2017   Cervical radiculopathy at C6 11/14/2017   Right   Complication of anesthesia    Coronary artery disease    Enlarged prostate    Fibromyalgia    H/O: knee surgery    15    Hayfever    WHEN YOUNG   History of blood clots    History of kidney stones    History of open heart surgery    ASCENDING AORTIC ANEURYSM   Ischemic stroke of frontal lobe (HCC) 03/14/2017   Bilateral; post-redo CT surgery   Pneumonia    PONV (postoperative nausea and vomiting)    only after CABG surgeries   Seizure disorder (HCC) 03/14/2017   Seizures (HCC)    last one 02/2021,updated 09/25/22   Status post knee surgery    DVT POST KNEE SURGERY   Stroke [facility])    Past Surgical History:  Procedure Laterality Date   ANTERIOR CERVICAL DECOMP/DISCECTOMY FUSION N/A 05/04/2020   Procedure: Anterior Cervical Decompression/Discectomy Fuion Cervical three-four, Cervical four-five, Cervical five-six;  Surgeon: Onetha Kuba, MD;  Location: [facility] OR;  Service: Neurosurgery;  Laterality: N/A;   BACK SURGERY     lower back 06/28/2021   BALLOON DILATION N/A 06/23/2019   Procedure: BALLOON DILATION;  Surgeon: Elicia Claw, MD;  Location: [facility] ENDOSCOPY;  Service: Gastroenterology;  Laterality: N/A;   BENTALL  PROCEDURE  01/04/2016   Bentall with 23 mm pericardial AVR; SVG-LAD, SVG-CX [facility], [place])   BICEPS TENDON REPAIR Right    BIOPSY  06/23/2019   Procedure: BIOPSY;  Surgeon: Elicia Claw, MD;  Location: [facility];  Service: Gastroenterology;;   CARDIAC SURGERY     ANUERSYM MAY 2017   Bentall procedure. Bioprosthetic aortic valve #23 mm bovine model #2700 TF ask, and 28 mm Gelweave woven vascular sinus of Valsalva graft   CARDIAC VALVE REPLACEMENT  02/2016, 02/2017   CARPAL TUNNEL RELEASE  08/2018   right hand    COLONOSCOPY WITH PROPOFOL  N/A 10/22/2022   Procedure: COLONOSCOPY WITH PROPOFOL ;  Surgeon: San Sandor GAILS, DO;  Location: [facility];  Service: Gastroenterology;  Laterality: N/A;   CORONARY ARTERY BYPASS GRAFT  01/04/2016   VG to LAD & VG to LCX   CORONARY ARTERY BYPASS GRAFT  03/13/2017   LIMA to LAD with steril abcess removal from dacron graft   CYSTOSCOPY/URETEROSCOPY/HOLMIUM LASER/STENT PLACEMENT Left 06/12/2022   Procedure: CYSTOSCOPY/URETEROSCOPY/HOLMIUM LASER/STENT PLACEMENT;  Surgeon: Carolee Sherwood JONETTA DOUGLAS, MD;  Location: [facility] ORS;  Service: Urology;  Laterality: Left;   ESOPHAGOGASTRODUODENOSCOPY     Had done dilatation done about 2 or 3 times before in [place]  ESOPHAGOGASTRODUODENOSCOPY (EGD) WITH PROPOFOL  N/A 06/23/2019   Procedure: ESOPHAGOGASTRODUODENOSCOPY (EGD) WITH PROPOFOL ;  Surgeon: Elicia Claw, MD;  Location: [facility];  Service: Gastroenterology;  Laterality: N/A;   FALSE ANEURYSM REPAIR  03/13/2017   redo sternotomy, sterile abscess removal from Dacron graft, omental flap around aorta, CABG: LIMA-LAD ([name], Dr. Zachary Remington)   GREATER OMENTAL FLAP CLOSURE  2018   Of pericardium 2018   INSERTION OF MESH  10/14/2020   Procedure: INSERTION OF MESH;  Surgeon: Sheldon Standing, MD;  Location: [facility] OR;  Service: General;;   JOINT REPLACEMENT  knee   knee surgeries     13 surgeries on knee done before knee replacement    KNEE SURGERY   1983   LAPAROSCOPIC LYSIS OF ADHESIONS N/A 05/19/2021   Procedure: LYSIS OF ADHESIONS;  Surgeon: Sheldon Standing, MD;  Location: [facility] OR;  Service: General;  Laterality: N/A;  GEN & LOCAL   LYSIS OF ADHESION N/A 10/14/2020   Procedure: LYSIS OF ADHESION;  Surgeon: Sheldon Standing, MD;  Location: [facility] OR;  Service: General;  Laterality: N/A;   open heart surgery     03-13-2017   REPLACEMENT TOTAL KNEE Left 2015   ROTATOR CUFF REPAIR Right    done with biceps tendon repair   SPINE SURGERY  upper neck, lower spine   TONSILLECTOMY     removed as a child.   TOTAL KNEE ARTHROPLASTY Right 01/18/2022   Procedure: RIGHT TOTAL KNEE ARTHROPLASTY;  Surgeon: Jerri Kay HERO, MD;  Location: [facility] OR;  Service: Orthopedics;  Laterality: Right;   VENTRAL HERNIA REPAIR N/A 10/14/2020   Procedure: LAPAROSCOPIC VENTRAL HERNIA REPAIR WITH TAP BLOCK BILATERAL;  Surgeon: Sheldon Standing, MD;  Location: [facility] OR;  Service: General;  Laterality: N/A;   VENTRAL HERNIA REPAIR N/A 05/19/2021   Procedure: LAPAROSCOPIC VENTRAL WALL HERNIA REPAIR;  Surgeon: Sheldon Standing, MD;  Location: [facility] OR;  Service: General;  Laterality: N/A;   Patient Active Problem List   Diagnosis Date Noted   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   Constipation 10/22/2022   Hypertrophied anal papilla 10/22/2022   Colon cancer screening 10/22/2022   Postop check 03/26/2022   Gait abnormality 01/29/2022   Status post total right knee replacement 01/18/2022   Coronary artery disease involving native coronary artery of native heart without angina pectoris 01/04/2022   Preop cardiovascular exam 01/04/2022   Primary osteoarthritis of right knee 01/04/2022   Acute right-sided low back pain without sciatica 12/25/2021   Aortic atherosclerosis (HCC) 09/04/2021   Spondylolisthesis at L4-L5 level 06/28/2021   S/P repair of ventral hernia 05/19/2021   S/P repair of recurrent ventral hernia 05/19/2021   Benign prostatic hyperplasia without  lower urinary tract symptoms 10/14/2020   Chronic pain 10/14/2020   ED (erectile dysfunction) of organic origin 10/14/2020   Esophageal dysphagia 10/14/2020   Gastroesophageal reflux disease 10/14/2020   History of aortic valve replacement with bioprosthetic valve 10/14/2020   Hx of aortic aneurysm repair 10/14/2020   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   Thoracic aortic aneurysm without rupture (HCC) 10/14/2020   Recurrent incisional hernia 10/14/2020   Spinal stenosis in cervical region 05/04/2020   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   Cervical radiculopathy at C6 11/14/2017   Carpal tunnel syndrome on right 07/08/2017   Tremor, essential 07/08/2017   Ischemic stroke of frontal lobe (HCC) 04/05/2017   Pain in right hand 04/05/2017   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 03/27/2017   Seizure (HCC) 03/14/2017   Presence of  aortocoronary bypass graft 03/13/2017   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HCC) 03/12/2017   Essential hypertension 02/26/2017   Mixed hyperlipidemia 02/26/2017    PCP: Jason Leita Repine, FNP   REFERRING PROVIDER: Onetha Kuba, MD   REFERRING DIAG: M43.16 (ICD-10-CM) - Spondylolisthesis, lumbar region   THERAPY DIAG:  Other low back pain  Muscle spasm of back  Muscle weakness (generalized)  RATIONALE FOR EVALUATION AND TREATMENT: Rehabilitation  ONSET DATE: Acute on chronic x ~2 months  NEXT MD VISIT: ESI 02/25/2024; MD f/u ~03/17/2024   SUBJECTIVE:                                                                                                                                                                                                         SUBJECTIVE STATEMENT: Pt reports the massage is the main thing that gives relief. He reports feeling frustrated with continued pain.  PAIN: Are you having pain? Yes: NPRS scale: 6/10   Pain location: Midline lumbar spine  Pain description: sharp  Aggravating factors: almost everything  Relieving factors: not  doing anything, elevating his legs while sitting   PERTINENT HISTORY:  History chronic back pain, multiple back surgeries, ACDF, B TKA, RA and OA, DVT, CVA frontal lobe with memory deficits, seizures, open heart surgery to repair aneurism, CABG x 2, omental flap graft to mediastinum, hernia repair.   PRECAUTIONS: None  RED FLAGS: None  WEIGHT BEARING RESTRICTIONS: No  FALLS:  Has patient fallen in last 6 months? No  LIVING ENVIRONMENT:  Lives with: lives with their spouse Lives in: House/apartment Stairs: No Has following equipment at home: Single point cane, Walker - 2 wheeled, and bed side commode  OCCUPATION: Retired  PLOF: Independent and Leisure: exercises frequently - walk treadmill    PATIENT GOALS: Take away the pain.   OBJECTIVE: (objective measures completed at initial evaluation unless otherwise dated)  DIAGNOSTIC FINDINGS:  01/26/24 - MR Lumbar Spine: Results still pending as of 02/03/2024   04/08/21 - MR Lumbar Spine:  IMPRESSION: No change is appreciated compared to the study of last year.   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 Pseudo disc herniation. Bilateral foraminal stenosis could possibly affect either or both exiting L5 nerves.   L3-4: Mild multifactorial stenosis secondary to bulging of the disc and mild facet and ligamentous prominence. No visible neural compression.   Other levels show disc bulges and facet degeneration but no apparent neural compressive stenosis.  PATIENT SURVEYS:  Modified Oswestry 25 / 50 = 50.0 %, severe disability   SCREENING FOR RED FLAGS:  Bowel or bladder incontinence: No Spinal tumors: No Cauda equina syndrome: No Compression fracture: No Abdominal aneurysm: No  COGNITION:  Overall cognitive status: Impaired - STM   SENSATION: WFL Numbness in hands from prior CVA  POSTURE:  decreased lumbar lordosis  PALPATION: TTP midline lumbar spine and over prior surgical incision. Increased  muscle tension in B lumbar paraspinals but denies TTP.  LUMBAR ROM:   Active  Eval - p! w/ all motions 02/27/24  Flexion Hands to knees Hands to ankles  Extension 60% limited 50% limited  Right lateral flexion Hand to lateral knee Lateral knee  Left lateral flexion Hand to lateral knee Lateral knee  Right rotation 40% limited   Left rotation 40% limited   (Blank rows = not tested)  MUSCLE LENGTH: Hamstrings: mod tight R, mild tight L ITB: mod tight R, mild tight L Piriformis: mod/severe tight B Hip flexors: mod tight B Quads: mod tight B Heelcord:   LOWER EXTREMITY ROM:    Limited d/t h/o TKA and low back pain   LOWER EXTREMITY MMT:    MMT Right eval Left eval R 03/13/24 L 03/13/24   Hip flexion 4 4+ 4+ 4+  Hip extension 4- 4 4- 4-  Hip abduction 4 4+ 4 4+  Hip adduction 4+ 4+    Hip internal rotation 4- p! Lateral knee 4+ 4 4+  Hip external rotation 4- p! Lateral knee 4 4+ 4+  Knee flexion 4+ 5 5 5   Knee extension 4+ 5 4 5   Ankle dorsiflexion 3+ 3+ 4- 4-  Ankle plantarflexion      Ankle inversion      Ankle eversion       (Blank rows = not tested)  LUMBAR SPECIAL TESTS:  Straight leg raise test: Positive    TODAY'S TREATMENT:  03/13/24 THERAPEUTIC EXERCISE: To improve strength, endurance, and flexibility. Rec Bike- L4 x 10 min LE MMT Wall push up 2x10 Wall walk ups with ladder 5x  NEUROMUSCULAR RE-EDUCATION: Standing hip abduction RTB x 10 B Standing hip extension x 10 RTB B MANUAL THERAPY: To promote normalized muscle tension, improved flexibility, and pain modulation utilizing connective tissue massage, therapeutic massage, and manual TP therapy.  IASTM with foam roller to R>L lumbar paraspinals  03/06/24:  THERAPEUTIC EXERCISE: To improve strength, endurance, and flexibility. Rec Bike- L3 x 10 mins BATCA-Seated Rows 35#,2x10 BATCA-Seated Pull-downs 15#, 2x10  BATCA- Leg Press 35#, 2x10-removed TENS unit after  NEUROMUSCULAR RE-EDUCATION: To improve  coordination, kinesthesia, posture, and proprioception (all w/ TENS unit on lower back)  3 way [employer] w/ 30s hold Seated Marches on flat table (TENS)  Seated marches on dynadisc w/ feet on 4 step  Seated marches on dynadisc w/ feet on 4 step + alternating opposite arm raises  Seated diagonals on dynadisc w/ yellow medball x10 B UE  MANUAL THERAPY: To promote normalized muscle tension, improved flexibility, and pain modulation utilizing connective tissue massage, therapeutic massage, and manual TP therapy.  IASTM with foam roller to R>L lumbar paraspinals, glutes, piriformis   MODALITIES:  TENS:  pt was shown how to use the unit & set the intensity at home if he were to purchase his own, used while doing exercises today.   03/02/24 THERAPEUTIC EXERCISE: To improve strength, endurance, and flexibility.   Rec Bike - L3 x 10 min  NEUROMUSCULAR RE-EDUCATION: To improve coordination, kinesthesia, posture, and proprioception. Seated yellow med ball diagonals x 20 bil Standing GTB pallof press x 10 bil Standing  GTB trunk rotation x 10 bil Standing GTB 4-way SLR x 10 each bil, intermittent UE support on back of chair for balance  MANUAL THERAPY: To promote normalized muscle tension, improved flexibility, and pain modulation utilizing connective tissue massage, therapeutic massage, and manual TP therapy.  STM/DTM and IASTM with foam roller to R>L lumbar paraspinals and glutes/piriformis   02/27/24 NEUROMUSCULAR RE-EDUCATION: To improve kinesthesia, posture, and proprioception.  Hooklying clam blue TB + TrA 2x10 Bridge with orange pball x 10- mild pain KTC w/ orange pball x 20 Seated rows 35lb high grips x 10 chest support; x 10 no chest support Standing trunk rotation GTB x 20 B  THERAPEUTIC EXERCISE: To improve strength, endurance, ROM, and flexibility. Recumbent Bike L3x10 min  MANUAL THERAPY: To promote normalized muscle tension and for pain modulation  IASTM with foam roll to  B lumbar paraspinals   02/24/2024 THERAPEUTIC EXERCISE: To improve strength, endurance, and flexibility.  Demonstration, verbal and tactile cues throughout for technique.  Rec Bike - L2 x 10 min Hooklying HS stretch with strap 3 x 30 bil Supine ITB stretch with strap 3 x 30 bil Supine bent knee cross-body glute stretch - deferred d/t increased LBP  NEUROMUSCULAR RE-EDUCATION: To improve coordination, kinesthesia, and posture. Hooklying TrA + alternating unilateral GTB hip ABD/ER bent-knee fallout 10 x 3 Bridge + GTB hip ABD isometric 10 x 3  MANUAL THERAPY: To promote normalized muscle tension and reduced pain utilizing connective tissue massage and therapeutic massage.  IASTM with foam roller to B lumbar paraspinals  SELF CARE: Provided education to prevent loss of gains achieved with physical therapy. Provided instruction in self-STM techniques to lumbar paraspinals using foam roller on wall.   02/17/2024 THERAPEUTIC EXERCISE: To improve strength and endurance.  Demonstration, verbal and tactile cues throughout for technique. Nustep L5 x 10' UE/LE for endurance  SELF CARE: Provided education to prevent loss of gains achieved with physical therapy and to prevent future decline in function. HEP review: Supine SLR stretch w/ strap x 1' x 2 BLE Supine cross over piriformis stretch x 1' x 2 RLE FABER piriformis stretch x 1' x 2 LLE  NEUROMUSCULAR RE-EDUCATION: To improve posture and proprioception. Multifidus walkouts x 10 BLE Standing RTB pulls to side with core stabilization Step-ups with Ta's Seated ball rollouts BUE multi directional x 2' Supine ball rollouts BLE x 1' w/ TA Supine ball S/S BLE x 1' w/ TA Supine bridging x 30 w/ TA Supine pelvic tilts   02/12/24 THERAPEUTIC EXERCISE: To improve flexibility.  Demonstration, verbal and tactile cues throughout for technique. Nustep L5x8min UE/LE- for endurance Trunk rotations in sitting x 5 B Seated flexion pball rollout  10x3; 5x Rotation B  NEUROMUSCULAR RE-EDUCATION: To improve postural awareness and core activtion Lat pull downs 15lb 3x10 Seated rows 25lb 2x10 low grips Farmer walk with 10lb kettle bell x 2 laps unilateral OHP 10lb B 2x10 Multifidus walkout RTB 5x10    02/10/24 THERAPEUTIC EXERCISE: To improve flexibility.  Demonstration, verbal and tactile cues throughout for technique. Nustep L4x35min UE/LE- for endurance NEUROMUSCULAR RE-EDUCATION: To improve coordination, kinesthesia, posture, and proprioception.  Seated ab sets orange pball 2x10 3 sec holds Seated pallof press RTB doubled 2x10 Seated rows RTB 2x10  Seated shoulder extension RTB 2x10 Standing heel/toe raise 10x3 B   02/03/2024 - Eval SELF CARE:  Reviewed eval findings and role of PT in addressing identified deficits as well as instruction in initial HEP (see below).   THERAPEUTIC EXERCISE: To improve flexibility.  Demonstration, verbal and tactile cues throughout for technique.  Supine and hooklying SKTC stretches x 15 each - deferred due to increased LBP R>L Hooklying KTOS piriformis stretch x 15 - deferred due to increased LBP R>L Hooklying LTR 2 x 10 - deferred due to increased LBP R>L Hooklying HS stretch with strap x 30 bil Supine ITB stretch with strap x 30 bil   PATIENT EDUCATION:  Education details: HEP update  Person educated: Patient Education method: Explanation, Demonstration, Verbal cues, and Handouts Education comprehension: verbalized understanding, returned demonstration, verbal cues required, and needs further education  HOME EXERCISE PROGRAM: Access Code: [email or web address] URL: [email or web address] Date: 02/10/2024 Prepared by: Sol Gaskins  Exercises - Supine Hamstring Stretch with Strap  - 2 x daily - 7 x weekly - 3 reps - 30 sec hold - Supine Iliotibial Band Stretch with Strap  - 2 x daily - 7 x weekly - 3 reps - 30 sec hold - Standing Shoulder Row with Anchored Resistance  - 1 x  daily - 7 x weekly - 3 sets - 10 reps - Shoulder extension with resistance - Neutral  - 1 x daily - 7 x weekly - 3 sets - 10 reps - Standing Anti-Rotation Press with Anchored Resistance  - 1 x daily - 7 x weekly - 3 sets - 10 reps   ASSESSMENT:  CLINICAL IMPRESSION: Erik Salazar reports experiencing continued pain since ESIs and having done more walking yesterday. His pain caused him to be unable to sleep well through last night. He reported a 6/10 pain and agreed to try a TENS unit on his lower back while doing his exercises for today. Pt was able to perform all activities in today's session but, reported that the TENS unit did not make his pain any better or worse. Continued to work on LE strengthening, core and lumbopelvic stabilization still incorporating trunk rotation components today. Pt felt some relief with MT using a foam roller to R>L lumbar paraspinals, glutes, and piriformis again today. Erik Salazar expressed wanting to give TENS another try on his next visit to see if it helps at all. Pt will continue to benefit from skilled PT intervention to address his deficits in ROM, strength, flexibility in order to improve mobility and activity tolerance w/ dec'd pain.    Eval: Erik Salazar is a 73 y.o. male who was referred to physical therapy for evaluation and treatment for acute on chronic LBP secondary to lumbar spondylolisthesis.  Patient reports onset of current midline low back pain beginning ~2 months ago without known MOI.  He is unable to isolate aggravating or relieving factors.  Patient has deficits in lumbar ROM, proximal LE flexibility, B LE strength, abnormal posture, and TTP with abnormal muscle tension  which are interfering with ADLs and are impacting quality of life.  On Modified Oswestry patient scored 25/50 demonstrating 50% or severe disability.  Erik Salazar will benefit from skilled PT to address above deficits to improve mobility and activity tolerance with decreased pain interference.  Attempt  at initial HEP creation limited by increased pain.  PT referral included orders for TPDN - patient made aware of recent change in rehab policy requiring OOP cash pay for TPDN at time of service in order to remain in regulatory and legal compliance with billing.  OBJECTIVE IMPAIRMENTS: decreased activity tolerance, decreased endurance, decreased knowledge of condition, decreased mobility, difficulty walking, decreased ROM, decreased strength, increased fascial restrictions, impaired perceived functional ability, increased muscle spasms, impaired flexibility, improper body mechanics, postural dysfunction, and pain.  ACTIVITY LIMITATIONS: carrying, lifting, bending, sitting, standing, squatting, sleeping, stairs, transfers, bed mobility, locomotion level, and caring for others   PARTICIPATION LIMITATIONS: meal prep, cleaning, laundry, driving, shopping, community activity, and yard work  PERSONAL FACTORS: Age, Fitness, Past/current experiences, Time since onset of injury/illness/exacerbation, and 3+ comorbidities: History chronic back pain, multiple back surgeries, ACDF, B TKA, RA and OA, DVT, CVA frontal lobe with memory deficits, seizures, open heart surgery to repair aneurism, CABG x 2, omental flap graft to mediastinum, hernia repair.  are also affecting patient's functional outcome.   REHAB POTENTIAL: Good  CLINICAL DECISION MAKING: Evolving/moderate complexity  EVALUATION COMPLEXITY: Moderate   GOALS: Goals reviewed with patient? Yes  SHORT TERM GOALS: Target date: 03/02/2024  Patient will be independent with initial HEP to improve outcomes and carryover.  Baseline: Partial initial HEP provided on eval but limited by pain Goal status: MET - 02/24/24   2.  Patient will report 25% improvement in low back pain to improve QOL. Baseline: 7/10 on eval, up to 10/10 at worst 02/24/24 - no significant change since eval Goal status: MET - 03/06/24: pt reports 25% improvement in LBP  03/02/24 - pain  increased since ESIs  LONG TERM GOALS: Target date: 03/30/2024  Patient will be independent with ongoing/advanced HEP for self-management at home.  Baseline:  Goal status: IN PROGRESS  2.  Patient will report 50-75% improvement in low back pain to improve QOL.  Baseline: 7/10 on eval, up to 10/10 at worst Goal status: IN PROGRESS  3.  Patient to demonstrate ability to achieve and maintain good spinal alignment/posturing and body mechanics needed for daily activities. Baseline:  Goal status: IN PROGRESS  4.  Patient will demonstrate functional pain free lumbar ROM to perform ADLs.   Baseline: Refer to above lumbar ROM table Goal status: IN PROGRESS- 02/27/24  5.  Patient will demonstrate improved B LE strength to >/= 4+/5 for improved stability and ease of mobility. Baseline: Refer to above LE MMT table Goal status: IN PROGRESS- 03/13/24 see chart  6. Patient will report </= 38% on Modified Oswestry (MCID = 12%) to demonstrate improved functional ability with decreased pain interference. Baseline: 25 / 50 = 50.0 % Goal status: IN PROGRESS  7.  Patient will tolerate 20-30 min of sitting or standing w/o increased pain to allow for  improved mobility and activity tolerance. Baseline: Per modified Oswestry pain limits sitting >1/2-hour and standing >10 minutes Goal status: IN PROGRESS   PLAN:  PT FREQUENCY: 2x/week  PT DURATION: 6-8 weeks  PLANNED INTERVENTIONS: [id number]- PT Re-evaluation, 97750- Physical Performance Testing, 97110-Therapeutic exercises, 97530- Therapeutic activity, [id number]- Neuromuscular re-education, 97535- Self Care, [id number]- Manual therapy, [id number]- Gait training, [phone]- Aquatic Therapy, [phone]- Electrical stimulation (unattended), 97035- Ultrasound, 79439 (1-2 muscles), 20561 (3+ muscles)- Dry Needling, Patient/Family education, Balance training, Taping, Joint mobilization, Spinal mobilization, Cryotherapy, and Moist heat  PLAN FOR NEXT SESSION:  Incorporate more lumbar  rotational and side bend motion gently; Continue machine weight training with core stabilization;  Ball stabilization exercises.  Consider possible e-stim/TENS and/or TPDN (both discussed with patient, pt tried TENS-would like to try again).       Phenix Grein L Cande Mastropietro, PTA 03/13/2024, 9:32 AM

## 2024-03-16 ENCOUNTER — Ambulatory Visit

## 2024-03-18 ENCOUNTER — Encounter: Payer: Self-pay | Admitting: Podiatry

## 2024-03-18 ENCOUNTER — Ambulatory Visit (INDEPENDENT_AMBULATORY_CARE_PROVIDER_SITE_OTHER): Admitting: Podiatry

## 2024-03-18 DIAGNOSIS — M7751 Other enthesopathy of right foot: Secondary | ICD-10-CM | POA: Diagnosis not present

## 2024-03-18 MED ORDER — TRIAMCINOLONE ACETONIDE 10 MG/ML IJ SUSP
10.0000 mg | Freq: Once | INTRAMUSCULAR | Status: AC
  Administered 2024-03-18: 10 mg

## 2024-03-18 NOTE — Progress Notes (Signed)
 Patient presents follow-up injection inflamed bursa plantar lateral foot right.  Second shot did not help as much the first 1.  Some considerable pain.   Physical exam:  General appearance: Pleasant, and in no acute distress. AOx3.  Vascular: Pedal pulses: DP 2/4 bilaterally, PT 2/4 bilaterally.   Neurological: Grossly intact bilaterally  Dermatologic:   Skin normal temperature bilaterally.  Skin normal color, tone, and texture bilaterally.   Musculoskeletal: Tenderness at the plantar lateral base of the fifth metatarsal right.  No tenderness at the fifth TMT or the peroneal tendon.    Diagnosis: 1 bursitis foot right.  Plan: -Continue wearing comfortable shoes use of the pad did not really help.  Discussed with him and ultimately we might need to do a custom orthotic to accommodate the met adductus deformity deformity. -injected 3cc 2:1 mixture 0.5 cc Marcaine :Kenolog 10mg /62ml at inflamed bursa plantar lateral aspect fifth metatarsal base right.    Return 2 weeks follow-up injection right

## 2024-03-19 ENCOUNTER — Ambulatory Visit: Admitting: Physical Therapy

## 2024-03-19 ENCOUNTER — Encounter: Payer: Self-pay | Admitting: Physical Therapy

## 2024-03-19 DIAGNOSIS — M62830 Muscle spasm of back: Secondary | ICD-10-CM

## 2024-03-19 DIAGNOSIS — M6281 Muscle weakness (generalized): Secondary | ICD-10-CM

## 2024-03-19 DIAGNOSIS — M5459 Other low back pain: Secondary | ICD-10-CM | POA: Diagnosis not present

## 2024-03-19 NOTE — Therapy (Signed)
 OUTPATIENT PHYSICAL THERAPY TREATMENT  Progress Note  Reporting Period 02/03/2024 to 03/19/2024   See note below for Objective Data and Assessment of Progress/Goals.     Patient Name: Erik Salazar MRN: [id number] DOB:[birth year], 73 y.o., male Today's Date: 03/19/2024  END OF SESSION:  PT End of Session - 03/19/24 0851     Visit Number 10    Date for PT Re-Evaluation 03/30/24    Authorization Type [employer]    PT Start Time [phone]    PT Stop Time 0933    PT Time Calculation (min) 42 min    Activity Tolerance Patient tolerated treatment well    Behavior During Therapy [facility] for tasks assessed/performed            Past Medical History:  Diagnosis Date   Allergy see attached info   Arthritis    RA and OA   Blood transfusion without reported diagnosis    with 2nd heart surgery   Carpal tunnel syndrome on right 07/08/2017   Cervical radiculopathy at C6 11/14/2017   Right   Complication of anesthesia    Coronary artery disease    Enlarged prostate    Fibromyalgia    H/O: knee surgery    15    Hayfever    WHEN YOUNG   History of blood clots    History of kidney stones    History of open heart surgery    ASCENDING AORTIC ANEURYSM   Ischemic stroke of frontal lobe (HCC) 03/14/2017   Bilateral; post-redo CT surgery   Pneumonia    PONV (postoperative nausea and vomiting)    only after CABG surgeries   Seizure disorder (HCC) 03/14/2017   Seizures (HCC)    last one 02/2021,updated 09/25/22   Status post knee surgery    DVT POST KNEE SURGERY   Stroke [facility])    Past Surgical History:  Procedure Laterality Date   ANTERIOR CERVICAL DECOMP/DISCECTOMY FUSION N/A 05/04/2020   Procedure: Anterior Cervical Decompression/Discectomy Fuion Cervical three-four, Cervical four-five, Cervical five-six;  Surgeon: Onetha Kuba, MD;  Location: [facility] OR;  Service: Neurosurgery;  Laterality: N/A;   BACK SURGERY     lower back 06/28/2021   BALLOON DILATION N/A 06/23/2019   Procedure:  BALLOON DILATION;  Surgeon: Elicia Claw, MD;  Location: [facility];  Service: Gastroenterology;  Laterality: N/A;   BENTALL PROCEDURE  01/04/2016   Bentall with 23 mm pericardial AVR; SVG-LAD, SVG-CX [facility], [place])   BICEPS TENDON REPAIR Right    BIOPSY  06/23/2019   Procedure: BIOPSY;  Surgeon: Elicia Claw, MD;  Location: [facility];  Service: Gastroenterology;;   CARDIAC SURGERY     ANUERSYM MAY 2017   Bentall procedure. Bioprosthetic aortic valve #23 mm bovine model #2700 TF ask, and 28 mm Gelweave woven vascular sinus of Valsalva graft   CARDIAC VALVE REPLACEMENT  02/2016, 02/2017   CARPAL TUNNEL RELEASE  08/2018   right hand    COLONOSCOPY WITH PROPOFOL  N/A 10/22/2022   Procedure: COLONOSCOPY WITH PROPOFOL ;  Surgeon: San Sandor GAILS, DO;  Location: [facility];  Service: Gastroenterology;  Laterality: N/A;   CORONARY ARTERY BYPASS GRAFT  01/04/2016   VG to LAD & VG to LCX   CORONARY ARTERY BYPASS GRAFT  03/13/2017   LIMA to LAD with steril abcess removal from dacron graft   CYSTOSCOPY/URETEROSCOPY/HOLMIUM LASER/STENT PLACEMENT Left 06/12/2022   Procedure: CYSTOSCOPY/URETEROSCOPY/HOLMIUM LASER/STENT PLACEMENT;  Surgeon: Carolee Sherwood JONETTA DOUGLAS, MD;  Location: [facility] ORS;  Service: Urology;  Laterality: Left;  ESOPHAGOGASTRODUODENOSCOPY     Had done dilatation done about 2 or 3 times before in [place]   ESOPHAGOGASTRODUODENOSCOPY (EGD) WITH PROPOFOL  N/A 06/23/2019   Procedure: ESOPHAGOGASTRODUODENOSCOPY (EGD) WITH PROPOFOL ;  Surgeon: Elicia Claw, MD;  Location: [facility];  Service: Gastroenterology;  Laterality: N/A;   FALSE ANEURYSM REPAIR  03/13/2017   redo sternotomy, sterile abscess removal from Dacron graft, omental flap around aorta, CABG: LIMA-LAD ([name], Dr. Zachary Remington)   GREATER OMENTAL FLAP CLOSURE  2018   Of pericardium 2018   INSERTION OF MESH  10/14/2020   Procedure: INSERTION OF MESH;  Surgeon: Sheldon Standing, MD;  Location: [facility] OR;   Service: General;;   JOINT REPLACEMENT  knee   knee surgeries     13 surgeries on knee done before knee replacement    KNEE SURGERY  1983   LAPAROSCOPIC LYSIS OF ADHESIONS N/A 05/19/2021   Procedure: LYSIS OF ADHESIONS;  Surgeon: Sheldon Standing, MD;  Location: [facility] OR;  Service: General;  Laterality: N/A;  GEN & LOCAL   LYSIS OF ADHESION N/A 10/14/2020   Procedure: LYSIS OF ADHESION;  Surgeon: Sheldon Standing, MD;  Location: [facility] OR;  Service: General;  Laterality: N/A;   open heart surgery     03-13-2017   REPLACEMENT TOTAL KNEE Left 2015   ROTATOR CUFF REPAIR Right    done with biceps tendon repair   SPINE SURGERY  upper neck, lower spine   TONSILLECTOMY     removed as a child.   TOTAL KNEE ARTHROPLASTY Right 01/18/2022   Procedure: RIGHT TOTAL KNEE ARTHROPLASTY;  Surgeon: Jerri Kay HERO, MD;  Location: [facility] OR;  Service: Orthopedics;  Laterality: Right;   VENTRAL HERNIA REPAIR N/A 10/14/2020   Procedure: LAPAROSCOPIC VENTRAL HERNIA REPAIR WITH TAP BLOCK BILATERAL;  Surgeon: Sheldon Standing, MD;  Location: [facility];  Service: General;  Laterality: N/A;   VENTRAL HERNIA REPAIR N/A 05/19/2021   Procedure: LAPAROSCOPIC VENTRAL WALL HERNIA REPAIR;  Surgeon: Sheldon Standing, MD;  Location: [facility] OR;  Service: General;  Laterality: N/A;   Patient Active Problem List   Diagnosis Date Noted   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   Constipation 10/22/2022   Hypertrophied anal papilla 10/22/2022   Colon cancer screening 10/22/2022   Postop check 03/26/2022   Gait abnormality 01/29/2022   Status post total right knee replacement 01/18/2022   Coronary artery disease involving native coronary artery of native heart without angina pectoris 01/04/2022   Preop cardiovascular exam 01/04/2022   Primary osteoarthritis of right knee 01/04/2022   Acute right-sided low back pain without sciatica 12/25/2021   Aortic atherosclerosis (HCC) 09/04/2021   Spondylolisthesis at L4-L5 level 06/28/2021    S/P repair of ventral hernia 05/19/2021   S/P repair of recurrent ventral hernia 05/19/2021   Benign prostatic hyperplasia without lower urinary tract symptoms 10/14/2020   Chronic pain 10/14/2020   ED (erectile dysfunction) of organic origin 10/14/2020   Esophageal dysphagia 10/14/2020   Gastroesophageal reflux disease 10/14/2020   History of aortic valve replacement with bioprosthetic valve 10/14/2020   Hx of aortic aneurysm repair 10/14/2020   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   Thoracic aortic aneurysm without rupture (HCC) 10/14/2020   Recurrent incisional hernia 10/14/2020   Spinal stenosis in cervical region 05/04/2020   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   Cervical radiculopathy at C6 11/14/2017   Carpal tunnel syndrome on right 07/08/2017   Tremor, essential 07/08/2017   Ischemic stroke of frontal lobe (HCC) 04/05/2017   Pain in right hand 04/05/2017  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 03/27/2017   Seizure (HCC) 03/14/2017   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/13/2017   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HCC) 03/12/2017   Essential hypertension 02/26/2017   Mixed hyperlipidemia 02/26/2017    PCP: Jason Leita Repine, FNP   REFERRING PROVIDER: Onetha Kuba, MD   REFERRING DIAG: M43.16 (ICD-10-CM) - Spondylolisthesis, lumbar region   THERAPY DIAG:  Other low back pain  Muscle spasm of back  Muscle weakness (generalized)  RATIONALE FOR EVALUATION AND TREATMENT: Rehabilitation  ONSET DATE: Acute on chronic x ~2 months  NEXT MD VISIT: ~05/04/2024   SUBJECTIVE:                                                                                                                                                                                                         SUBJECTIVE STATEMENT: Pt reports he saw Dr. Eliott PA who has scheduled him for another series of 6 injections higher up in his back on 04/13/24 and he will then f/u with Dr. Onetha 3 weeks  later.  PAIN: Are you having pain? Yes: NPRS scale: 5/10   Pain location: Midline lumbar spine  Pain description: sharp  Aggravating factors: almost everything  Relieving factors: not doing anything, elevating his legs while sitting   PERTINENT HISTORY:  History chronic back pain, multiple back surgeries, ACDF, B TKA, RA and OA, DVT, CVA frontal lobe with memory deficits, seizures, open heart surgery to repair aneurism, CABG x 2, omental flap graft to mediastinum, hernia repair.   PRECAUTIONS: None  RED FLAGS: None  WEIGHT BEARING RESTRICTIONS: No  FALLS:  Has patient fallen in last 6 months? No  LIVING ENVIRONMENT:  Lives with: lives with their spouse Lives in: House/apartment Stairs: No Has following equipment at home: Single point cane, Walker - 2 wheeled, and bed side commode  OCCUPATION: Retired  PLOF: Independent and Leisure: exercises frequently - walk treadmill    PATIENT GOALS: Take away the pain.   OBJECTIVE: (objective measures completed at initial evaluation unless otherwise dated)  DIAGNOSTIC FINDINGS:  01/26/24 - MR Lumbar Spine: Results still pending as of 02/03/2024  04/08/21 - MR Lumbar Spine:  IMPRESSION: No change is appreciated compared to the study of last year.   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 Pseudo disc herniation. Bilateral foraminal stenosis could possibly affect either or both exiting L5 nerves.   L3-4: Mild multifactorial stenosis secondary to bulging of the disc and mild facet and ligamentous prominence. No visible neural compression.   Other levels show disc bulges  and facet degeneration but no apparent neural compressive stenosis.  PATIENT SURVEYS:  Modified Oswestry 25 / 50 = 50.0 %, severe disability  03/19/24: 19 / 50 = 38.0 %, moderate disability  SCREENING FOR RED FLAGS: Bowel or bladder incontinence: No Spinal tumors: No Cauda equina syndrome: No Compression fracture: No Abdominal aneurysm:  No  COGNITION:  Overall cognitive status: Impaired - STM   SENSATION: WFL Numbness in hands from prior CVA  POSTURE:  decreased lumbar lordosis  PALPATION: TTP midline lumbar spine and over prior surgical incision. Increased muscle tension in B lumbar paraspinals but denies TTP.  LUMBAR ROM:   Active  Eval - p! w/ all motions 02/27/24  Flexion Hands to knees Hands to ankles  Extension 60% limited 50% limited  Right lateral flexion Hand to lateral knee Lateral knee  Left lateral flexion Hand to lateral knee Lateral knee  Right rotation 40% limited   Left rotation 40% limited   (Blank rows = not tested)  MUSCLE LENGTH: Hamstrings: mod tight R, mild tight L ITB: mod tight R, mild tight L Piriformis: mod/severe tight B Hip flexors: mod tight B Quads: mod tight B Heelcord:   LOWER EXTREMITY ROM:    Limited d/t h/o TKA and low back pain   LOWER EXTREMITY MMT:    MMT Right eval Left eval R 03/13/24 L 03/13/24   Hip flexion 4 4+ 4+ 4+  Hip extension 4- 4 4- 4-  Hip abduction 4 4+ 4 4+  Hip adduction 4+ 4+    Hip internal rotation 4- p! Lateral knee 4+ 4 4+  Hip external rotation 4- p! Lateral knee 4 4+ 4+  Knee flexion 4+ 5 5 5   Knee extension 4+ 5 4 5   Ankle dorsiflexion 3+ 3+ 4- 4-  Ankle plantarflexion      Ankle inversion      Ankle eversion       (Blank rows = not tested)  LUMBAR SPECIAL TESTS:  Straight leg raise test: Positive    TODAY'S TREATMENT:   03/19/2024 THERAPEUTIC EXERCISE: To improve strength and endurance.   TM 2.5 mph x 10'  THERAPEUTIC ACTIVITIES: To improve functional performance.  Demonstration, verbal and tactile cues throughout for technique. Modified Oswestry: 19 / 50 = 38.0 %, moderate disability  NEUROMUSCULAR RE-EDUCATION: To improve balance, coordination, kinesthesia, posture, and proprioception. Standing TrA +  shoulder row GTB x 10, Blue TB x 10 Standing TrA +  shoulder extension GTB x 10, Blue TB x 10  MODALITIES: Auvon  TENS unit with 1 channel on each thoracolumbar paraspinals, intensity to pt tolerance during therapeutic exercises  MANUAL THERAPY: To promote normalized muscle tension, improved flexibility, pain modulation, and reduced pain utilizing connective tissue massage and therapeutic massage.  IASTM with foam roller to R>L lumbar paraspinals   03/13/24 THERAPEUTIC EXERCISE: To improve strength, endurance, and flexibility. Rec Bike- L4 x 10 min LE MMT Wall push up 2x10 Wall walk ups with ladder 5x  NEUROMUSCULAR RE-EDUCATION: Standing hip abduction RTB x 10 B Standing hip extension x 10 RTB B  MANUAL THERAPY: To promote normalized muscle tension, improved flexibility, and pain modulation utilizing connective tissue massage, therapeutic massage, and manual TP therapy.  IASTM with foam roller to R>L lumbar paraspinals   03/06/24:  THERAPEUTIC EXERCISE: To improve strength, endurance, and flexibility. Rec Bike- L3 x 10 mins BATCA-Seated Rows 35#,2x10 BATCA-Seated Pull-downs 15#, 2x10  BATCA- Leg Press 35#, 2x10-removed TENS unit after   NEUROMUSCULAR RE-EDUCATION: To improve coordination, kinesthesia, posture,  and proprioception (all w/ TENS unit on lower back)  3 way [employer] w/ 30s hold Seated Marches on flat table (TENS)  Seated marches on dynadisc w/ feet on 4 step  Seated marches on dynadisc w/ feet on 4 step + alternating opposite arm raises  Seated diagonals on dynadisc w/ yellow medball x10 B UE   MANUAL THERAPY: To promote normalized muscle tension, improved flexibility, and pain modulation utilizing connective tissue massage, therapeutic massage, and manual TP therapy.  IASTM with foam roller to R>L lumbar paraspinals, glutes, piriformis   MODALITIES:  TENS:  pt was shown how to use the unit & set the intensity at home if he were to purchase his own, used while doing exercises today.    03/02/24 THERAPEUTIC EXERCISE: To improve strength, endurance, and flexibility.    Rec Bike - L3 x 10 min  NEUROMUSCULAR RE-EDUCATION: To improve coordination, kinesthesia, posture, and proprioception. Seated yellow med ball diagonals x 20 bil Standing GTB pallof press x 10 bil Standing GTB trunk rotation x 10 bil Standing GTB 4-way SLR x 10 each bil, intermittent UE support on back of chair for balance  MANUAL THERAPY: To promote normalized muscle tension, improved flexibility, and pain modulation utilizing connective tissue massage, therapeutic massage, and manual TP therapy.  STM/DTM and IASTM with foam roller to R>L lumbar paraspinals and glutes/piriformis   02/27/24 NEUROMUSCULAR RE-EDUCATION: To improve kinesthesia, posture, and proprioception.  Hooklying clam blue TB + TrA 2x10 Bridge with orange pball x 10- mild pain KTC w/ orange pball x 20 Seated rows 35lb high grips x 10 chest support; x 10 no chest support Standing trunk rotation GTB x 20 B  THERAPEUTIC EXERCISE: To improve strength, endurance, ROM, and flexibility. Recumbent Bike L3x10 min  MANUAL THERAPY: To promote normalized muscle tension and for pain modulation  IASTM with foam roll to B lumbar paraspinals   02/24/2024 THERAPEUTIC EXERCISE: To improve strength, endurance, and flexibility.  Demonstration, verbal and tactile cues throughout for technique.  Rec Bike - L2 x 10 min Hooklying HS stretch with strap 3 x 30 bil Supine ITB stretch with strap 3 x 30 bil Supine bent knee cross-body glute stretch - deferred d/t increased LBP  NEUROMUSCULAR RE-EDUCATION: To improve coordination, kinesthesia, and posture. Hooklying TrA + alternating unilateral GTB hip ABD/ER bent-knee fallout 10 x 3 Bridge + GTB hip ABD isometric 10 x 3  MANUAL THERAPY: To promote normalized muscle tension and reduced pain utilizing connective tissue massage and therapeutic massage.  IASTM with foam roller to B lumbar paraspinals  SELF CARE: Provided education to prevent loss of gains achieved with physical  therapy. Provided instruction in self-STM techniques to lumbar paraspinals using foam roller on wall.   02/17/2024 THERAPEUTIC EXERCISE: To improve strength and endurance.  Demonstration, verbal and tactile cues throughout for technique. Nustep L5 x 10' UE/LE for endurance  SELF CARE: Provided education to prevent loss of gains achieved with physical therapy and to prevent future decline in function. HEP review: Supine SLR stretch w/ strap x 1' x 2 BLE Supine cross over piriformis stretch x 1' x 2 RLE FABER piriformis stretch x 1' x 2 LLE  NEUROMUSCULAR RE-EDUCATION: To improve posture and proprioception. Multifidus walkouts x 10 BLE Standing RTB pulls to side with core stabilization Step-ups with Ta's Seated ball rollouts BUE multi directional x 2' Supine ball rollouts BLE x 1' w/ TA Supine ball S/S BLE x 1' w/ TA Supine bridging x 30 w/ TA Supine pelvic tilts  02/12/24 THERAPEUTIC EXERCISE: To improve flexibility.  Demonstration, verbal and tactile cues throughout for technique. Nustep L5x8min UE/LE- for endurance Trunk rotations in sitting x 5 B Seated flexion pball rollout 10x3; 5x Rotation B  NEUROMUSCULAR RE-EDUCATION: To improve postural awareness and core activtion Lat pull downs 15lb 3x10 Seated rows 25lb 2x10 low grips Farmer walk with 10lb kettle bell x 2 laps unilateral OHP 10lb B 2x10 Multifidus walkout RTB 5x10    02/10/24 THERAPEUTIC EXERCISE: To improve flexibility.  Demonstration, verbal and tactile cues throughout for technique. Nustep L4x80min UE/LE- for endurance NEUROMUSCULAR RE-EDUCATION: To improve coordination, kinesthesia, posture, and proprioception.  Seated ab sets orange pball 2x10 3 sec holds Seated pallof press RTB doubled 2x10 Seated rows RTB 2x10  Seated shoulder extension RTB 2x10 Standing heel/toe raise 10x3 B   02/03/2024 - Eval SELF CARE:  Reviewed eval findings and role of PT in addressing identified deficits as well as  instruction in initial HEP (see below).   THERAPEUTIC EXERCISE: To improve flexibility.  Demonstration, verbal and tactile cues throughout for technique.  Supine and hooklying SKTC stretches x 15 each - deferred due to increased LBP R>L Hooklying KTOS piriformis stretch x 15 - deferred due to increased LBP R>L Hooklying LTR 2 x 10 - deferred due to increased LBP R>L Hooklying HS stretch with strap x 30 bil Supine ITB stretch with strap x 30 bil   PATIENT EDUCATION:  Education details: HEP update  Person educated: Patient Education method: Explanation, Demonstration, Verbal cues, and Handouts Education comprehension: verbalized understanding, returned demonstration, verbal cues required, and needs further education  HOME EXERCISE PROGRAM: Access Code: [email or web address] URL: [email or web address] Date: 02/10/2024 Prepared by: Sol Gaskins  Exercises - Supine Hamstring Stretch with Strap  - 2 x daily - 7 x weekly - 3 reps - 30 sec hold - Supine Iliotibial Band Stretch with Strap  - 2 x daily - 7 x weekly - 3 reps - 30 sec hold - Standing Shoulder Row with Anchored Resistance  - 1 x daily - 7 x weekly - 3 sets - 10 reps - Shoulder extension with resistance - Neutral  - 1 x daily - 7 x weekly - 3 sets - 10 reps - Standing Anti-Rotation Press with Anchored Resistance  - 1 x daily - 7 x weekly - 3 sets - 10 reps   ASSESSMENT:  CLINICAL IMPRESSION: Manford reports only minimal (10 to 15%) improvement in his back pain thus far however modified Oswestry demonstrating 12% improvement from 50% or severe disability to 38% or moderate disability.  Some gains noted with recent LE MMT however continued proximal weakness evident.  He requested to try the TENS unit again during the therapeutic exercises today, although did not really note much difference in his pain whether using the TENS unit or not.  He was able to progress resistance to blue TB with standing TrA + scapular retraction with  rows/extension, with blue band provided for home use.  Rana is demonstrating progress toward his PT goals and will benefit from continues skilled PT to address ongoing ROM and strength deficits to improve mobility and activity tolerance with decreased pain interference.   Eval: Eileen Duchesne is a 73 y.o. male who was referred to physical therapy for evaluation and treatment for acute on chronic LBP secondary to lumbar spondylolisthesis.  Patient reports onset of current midline low back pain beginning ~2 months ago without known MOI.  He is unable to isolate aggravating or relieving factors.  Patient has deficits  in lumbar ROM, proximal LE flexibility, B LE strength, abnormal posture, and TTP with abnormal muscle tension  which are interfering with ADLs and are impacting quality of life.  On Modified Oswestry patient scored 25/50 demonstrating 50% or severe disability.  Quandarius will benefit from skilled PT to address above deficits to improve mobility and activity tolerance with decreased pain interference.  Attempt at initial HEP creation limited by increased pain.  PT referral included orders for TPDN - patient made aware of recent change in rehab policy requiring OOP cash pay for TPDN at time of service in order to remain in regulatory and legal compliance with billing.  OBJECTIVE IMPAIRMENTS: decreased activity tolerance, decreased endurance, decreased knowledge of condition, decreased mobility, difficulty walking, decreased ROM, decreased strength, increased fascial restrictions, impaired perceived functional ability, increased muscle spasms, impaired flexibility, improper body mechanics, postural dysfunction, and pain.  ACTIVITY LIMITATIONS: carrying, lifting, bending, sitting, standing, squatting, sleeping, stairs, transfers, bed mobility, locomotion level, and caring for others   PARTICIPATION LIMITATIONS: meal prep, cleaning, laundry, driving, shopping, community activity, and yard work  PERSONAL  FACTORS: Age, Fitness, Past/current experiences, Time since onset of injury/illness/exacerbation, and 3+ comorbidities: History chronic back pain, multiple back surgeries, ACDF, B TKA, RA and OA, DVT, CVA frontal lobe with memory deficits, seizures, open heart surgery to repair aneurism, CABG x 2, omental flap graft to mediastinum, hernia repair.  are also affecting patient's functional outcome.   REHAB POTENTIAL: Good  CLINICAL DECISION MAKING: Evolving/moderate complexity  EVALUATION COMPLEXITY: Moderate   GOALS: Goals reviewed with patient? Yes  SHORT TERM GOALS: Target date: 03/02/2024  Patient will be independent with initial HEP to improve outcomes and carryover.  Baseline: Partial initial HEP provided on eval but limited by pain Goal status: MET - 02/24/24   2.  Patient will report 25% improvement in low back pain to improve QOL. Baseline: 7/10 on eval, up to 10/10 at worst 02/24/24 - no significant change since eval 03/02/24 - pain increased since ESIs Goal status: MET - 03/06/24: pt reports 25% improvement in LBP    LONG TERM GOALS: Target date: 03/30/2024  Patient will be independent with ongoing/advanced HEP for self-management at home.  Baseline:  Goal status: IN PROGRESS - 03/17/24 - met for current HEP, anticipate further updates  2.  Patient will report 50-75% improvement in low back pain to improve QOL.  Baseline: 7/10 on eval, up to 10/10 at worst Goal status: IN PROGRESS - 03/17/24 - 10-15% improvement currently   3.  Patient to demonstrate ability to achieve and maintain good spinal alignment/posturing and body mechanics needed for daily activities. Baseline:  Goal status: IN PROGRESS  4.  Patient will demonstrate functional pain free lumbar ROM to perform ADLs.   Baseline: Refer to above lumbar ROM table Goal status: IN PROGRESS - 02/27/24  5.  Patient will demonstrate improved B LE strength to >/= 4+/5 for improved stability and ease of mobility. Baseline: Refer  to above LE MMT table Goal status: IN PROGRESS - 03/13/24 - see chart  6. Patient will report </= 38% on Modified Oswestry (MCID = 12%) to demonstrate improved functional ability with decreased pain interference. Baseline: 25 / 50 = 50.0 % Goal status: MET - 03/19/24 - 19 / 50 = 38.0 %  7.  Patient will tolerate 20-30 min of sitting or standing w/o increased pain to allow for  improved mobility and activity tolerance. Baseline: Per modified Oswestry pain limits sitting >1/2-hour and standing >10 minutes Goal status: IN  PROGRESS   PLAN:  PT FREQUENCY: 2x/week  PT DURATION: 6-8 weeks  PLANNED INTERVENTIONS: 97164- PT Re-evaluation, 97750- Physical Performance Testing, 97110-Therapeutic exercises, 97530- Therapeutic activity, [id number]- Neuromuscular re-education, 97535- Self Care, [id number]- Manual therapy, [phone]- Gait training, [phone]- Aquatic Therapy, [phone]- Electrical stimulation (unattended), 97035- Ultrasound, 79439 (1-2 muscles), [id number] (3+ muscles)- Dry Needling, Patient/Family education, Balance training, Taping, Joint mobilization, Spinal mobilization, Cryotherapy, and Moist heat  PLAN FOR NEXT SESSION:  Posture and body mechanics education; Incorporate more lumbar rotational and side bend motion gently; Continue machine weight training with core stabilization;  Ball stabilization exercises.  Consider possible e-stim/TENS and/or TPDN (both discussed with patient, pt tried TENS - try again and provide info on home unit if interested?).       Elijah CHRISTELLA Hidden, PT 03/19/2024, 12:12 PM

## 2024-03-23 ENCOUNTER — Ambulatory Visit: Attending: Neurosurgery

## 2024-03-23 DIAGNOSIS — G8929 Other chronic pain: Secondary | ICD-10-CM | POA: Diagnosis present

## 2024-03-23 DIAGNOSIS — R252 Cramp and spasm: Secondary | ICD-10-CM | POA: Diagnosis present

## 2024-03-23 DIAGNOSIS — M6281 Muscle weakness (generalized): Secondary | ICD-10-CM | POA: Insufficient documentation

## 2024-03-23 DIAGNOSIS — M62830 Muscle spasm of back: Secondary | ICD-10-CM | POA: Diagnosis present

## 2024-03-23 DIAGNOSIS — M5459 Other low back pain: Secondary | ICD-10-CM | POA: Diagnosis present

## 2024-03-23 DIAGNOSIS — M5442 Lumbago with sciatica, left side: Secondary | ICD-10-CM | POA: Diagnosis present

## 2024-03-23 NOTE — Therapy (Signed)
 OUTPATIENT PHYSICAL THERAPY TREATMENT     Patient Name: Erik Salazar MRN: [id number] DOB:Jun 22, 1951, 73 y.o., male Today's Date: 03/23/2024  END OF SESSION:  PT End of Session - 03/23/24 0912     Visit Number 11    Date for PT Re-Evaluation 03/30/24    Authorization Type [employer]    PT Start Time [phone]    PT Stop Time 0933    PT Time Calculation (min) 44 min    Activity Tolerance Patient tolerated treatment well    Behavior During Therapy [facility] for tasks assessed/performed             Past Medical History:  Diagnosis Date   Allergy see attached info   Arthritis    RA and OA   Blood transfusion without reported diagnosis    with 2nd heart surgery   Carpal tunnel syndrome on right 07/08/2017   Cervical radiculopathy at C6 11/14/2017   Right   Complication of anesthesia    Coronary artery disease    Enlarged prostate    Fibromyalgia    H/O: knee surgery    15    Hayfever    WHEN YOUNG   History of blood clots    History of kidney stones    History of open heart surgery    ASCENDING AORTIC ANEURYSM   Ischemic stroke of frontal lobe (HCC) 03/14/2017   Bilateral; post-redo CT surgery   Pneumonia    PONV (postoperative nausea and vomiting)    only after CABG surgeries   Seizure disorder (HCC) 03/14/2017   Seizures (HCC)    last one 02/2021,updated 09/25/22   Status post knee surgery    DVT POST KNEE SURGERY   Stroke [facility])    Past Surgical History:  Procedure Laterality Date   ANTERIOR CERVICAL DECOMP/DISCECTOMY FUSION N/A 05/04/2020   Procedure: Anterior Cervical Decompression/Discectomy Fuion Cervical three-four, Cervical four-five, Cervical five-six;  Surgeon: Onetha Kuba, MD;  Location: [facility] OR;  Service: Neurosurgery;  Laterality: N/A;   BACK SURGERY     lower back 06/28/2021   BALLOON DILATION N/A 06/23/2019   Procedure: BALLOON DILATION;  Surgeon: Elicia Claw, MD;  Location: [facility];  Service: Gastroenterology;  Laterality: N/A;   BENTALL  PROCEDURE  01/04/2016   Bentall with 23 mm pericardial AVR; SVG-LAD, SVG-CX [facility] [place])   BICEPS TENDON REPAIR Right    BIOPSY  06/23/2019   Procedure: BIOPSY;  Surgeon: Elicia Claw, MD;  Location: [facility];  Service: Gastroenterology;;   CARDIAC SURGERY     ANUERSYM MAY 2017   Bentall procedure. Bioprosthetic aortic valve #23 mm bovine model #2700 TF ask, and 28 mm Gelweave woven vascular sinus of Valsalva graft   CARDIAC VALVE REPLACEMENT  02/2016, 02/2017   CARPAL TUNNEL RELEASE  08/2018   right hand    COLONOSCOPY WITH PROPOFOL  N/A 10/22/2022   Procedure: COLONOSCOPY WITH PROPOFOL ;  Surgeon: San Sandor GAILS, DO;  Location: [facility];  Service: Gastroenterology;  Laterality: N/A;   CORONARY ARTERY BYPASS GRAFT  01/04/2016   VG to LAD & VG to LCX   CORONARY ARTERY BYPASS GRAFT  03/13/2017   LIMA to LAD with steril abcess removal from dacron graft   CYSTOSCOPY/URETEROSCOPY/HOLMIUM LASER/STENT PLACEMENT Left 06/12/2022   Procedure: CYSTOSCOPY/URETEROSCOPY/HOLMIUM LASER/STENT PLACEMENT;  Surgeon: Carolee Sherwood JONETTA DOUGLAS, MD;  Location: [facility];  Service: Urology;  Laterality: Left;   ESOPHAGOGASTRODUODENOSCOPY     Had done dilatation done about 2 or 3 times before in [place]   ESOPHAGOGASTRODUODENOSCOPY (  EGD) WITH PROPOFOL  N/A 06/23/2019   Procedure: ESOPHAGOGASTRODUODENOSCOPY (EGD) WITH PROPOFOL ;  Surgeon: Elicia Claw, MD;  Location: [facility];  Service: Gastroenterology;  Laterality: N/A;   FALSE ANEURYSM REPAIR  03/13/2017   redo sternotomy, sterile abscess removal from Dacron graft, omental flap around aorta, CABG: LIMA-LAD ([name], Dr. Zachary Remington)   GREATER OMENTAL FLAP CLOSURE  2018   Of pericardium 2018   INSERTION OF MESH  10/14/2020   Procedure: INSERTION OF MESH;  Surgeon: Sheldon Standing, MD;  Location: [facility] OR;  Service: General;;   JOINT REPLACEMENT  knee   knee surgeries     13 surgeries on knee done before knee replacement    KNEE SURGERY   1983   LAPAROSCOPIC LYSIS OF ADHESIONS N/A 05/19/2021   Procedure: LYSIS OF ADHESIONS;  Surgeon: Sheldon Standing, MD;  Location: [facility] OR;  Service: General;  Laterality: N/A;  GEN & LOCAL   LYSIS OF ADHESION N/A 10/14/2020   Procedure: LYSIS OF ADHESION;  Surgeon: Sheldon Standing, MD;  Location: [facility] OR;  Service: General;  Laterality: N/A;   open heart surgery     03-13-2017   REPLACEMENT TOTAL KNEE Left 2015   ROTATOR CUFF REPAIR Right    done with biceps tendon repair   SPINE SURGERY  upper neck, lower spine   TONSILLECTOMY     removed as a child.   TOTAL KNEE ARTHROPLASTY Right 01/18/2022   Procedure: RIGHT TOTAL KNEE ARTHROPLASTY;  Surgeon: Jerri Kay HERO, MD;  Location: [facility] OR;  Service: Orthopedics;  Laterality: Right;   VENTRAL HERNIA REPAIR N/A 10/14/2020   Procedure: LAPAROSCOPIC VENTRAL HERNIA REPAIR WITH TAP BLOCK BILATERAL;  Surgeon: Sheldon Standing, MD;  Location: [facility];  Service: General;  Laterality: N/A;   VENTRAL HERNIA REPAIR N/A 05/19/2021   Procedure: LAPAROSCOPIC VENTRAL WALL HERNIA REPAIR;  Surgeon: Sheldon Standing, MD;  Location: [facility] OR;  Service: General;  Laterality: N/A;   Patient Active Problem List   Diagnosis Date Noted   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   Constipation 10/22/2022   Hypertrophied anal papilla 10/22/2022   Colon cancer screening 10/22/2022   Postop check 03/26/2022   Gait abnormality 01/29/2022   Status post total right knee replacement 01/18/2022   Coronary artery disease involving native coronary artery of native heart without angina pectoris 01/04/2022   Preop cardiovascular exam 01/04/2022   Primary osteoarthritis of right knee 01/04/2022   Acute right-sided low back pain without sciatica 12/25/2021   Aortic atherosclerosis (HCC) 09/04/2021   Spondylolisthesis at L4-L5 level 06/28/2021   S/P repair of ventral hernia 05/19/2021   S/P repair of recurrent ventral hernia 05/19/2021   Benign prostatic hyperplasia without  lower urinary tract symptoms 10/14/2020   Chronic pain 10/14/2020   ED (erectile dysfunction) of organic origin 10/14/2020   Esophageal dysphagia 10/14/2020   Gastroesophageal reflux disease 10/14/2020   History of aortic valve replacement with bioprosthetic valve 10/14/2020   Hx of aortic aneurysm repair 10/14/2020   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   Thoracic aortic aneurysm without rupture (HCC) 10/14/2020   Recurrent incisional hernia 10/14/2020   Spinal stenosis in cervical region 05/04/2020   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   Cervical radiculopathy at C6 11/14/2017   Carpal tunnel syndrome on right 07/08/2017   Tremor, essential 07/08/2017   Ischemic stroke of frontal lobe (HCC) 04/05/2017   Pain in right hand 04/05/2017   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 03/27/2017   Seizure (HCC) 03/14/2017   Presence of aortocoronary  bypass graft 03/13/2017   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HCC) 03/12/2017   Essential hypertension 02/26/2017   Mixed hyperlipidemia 02/26/2017    PCP: Jason Leita Repine, FNP   REFERRING PROVIDER: Onetha Kuba, MD   REFERRING DIAG: M43.16 (ICD-10-CM) - Spondylolisthesis, lumbar region   THERAPY DIAG:  Other low back pain  Muscle spasm of back  Muscle weakness (generalized)  RATIONALE FOR EVALUATION AND TREATMENT: Rehabilitation  ONSET DATE: Acute on chronic x ~2 months  NEXT MD VISIT: ~05/04/2024   SUBJECTIVE:                                                                                                                                                                                                         SUBJECTIVE STATEMENT: Nothing different today  PAIN: Are you having pain? Yes: NPRS scale: 5/10   Pain location: Midline lumbar spine  Pain description: sharp  Aggravating factors: almost everything  Relieving factors: not doing anything, elevating his legs while sitting   PERTINENT HISTORY:  History chronic back pain,  multiple back surgeries, ACDF, B TKA, RA and OA, DVT, CVA frontal lobe with memory deficits, seizures, open heart surgery to repair aneurism, CABG x 2, omental flap graft to mediastinum, hernia repair.   PRECAUTIONS: None  RED FLAGS: None  WEIGHT BEARING RESTRICTIONS: No  FALLS:  Has patient fallen in last 6 months? No  LIVING ENVIRONMENT:  Lives with: lives with their spouse Lives in: House/apartment Stairs: No Has following equipment at home: Single point cane, Walker - 2 wheeled, and bed side commode  OCCUPATION: Retired  PLOF: Independent and Leisure: exercises frequently - walk treadmill    PATIENT GOALS: Take away the pain.   OBJECTIVE: (objective measures completed at initial evaluation unless otherwise dated)  DIAGNOSTIC FINDINGS:  01/26/24 - MR Lumbar Spine: Results still pending as of 02/03/2024  04/08/21 - MR Lumbar Spine:  IMPRESSION: No change is appreciated compared to the study of last year.   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 Pseudo disc herniation. Bilateral foraminal stenosis could possibly affect either or both exiting L5 nerves.   L3-4: Mild multifactorial stenosis secondary to bulging of the disc and mild facet and ligamentous prominence. No visible neural compression.   Other levels show disc bulges and facet degeneration but no apparent neural compressive stenosis.  PATIENT SURVEYS:  Modified Oswestry 25 / 50 = 50.0 %, severe disability  03/19/24: 19 / 50 = 38.0 %, moderate disability  SCREENING FOR RED FLAGS: Bowel or bladder incontinence: No Spinal tumors: No Cauda equina syndrome: No  Compression fracture: No Abdominal aneurysm: No  COGNITION:  Overall cognitive status: Impaired - STM   SENSATION: WFL Numbness in hands from prior CVA  POSTURE:  decreased lumbar lordosis  PALPATION: TTP midline lumbar spine and over prior surgical incision. Increased muscle tension in B lumbar paraspinals but denies  TTP.  LUMBAR ROM:   Active  Eval - p! w/ all motions 02/27/24  Flexion Hands to knees Hands to ankles  Extension 60% limited 50% limited  Right lateral flexion Hand to lateral knee Lateral knee  Left lateral flexion Hand to lateral knee Lateral knee  Right rotation 40% limited   Left rotation 40% limited   (Blank rows = not tested)  MUSCLE LENGTH: Hamstrings: mod tight R, mild tight L ITB: mod tight R, mild tight L Piriformis: mod/severe tight B Hip flexors: mod tight B Quads: mod tight B Heelcord:   LOWER EXTREMITY ROM:    Limited d/t h/o TKA and low back pain   LOWER EXTREMITY MMT:    MMT Right eval Left eval R 03/13/24 L 03/13/24   Hip flexion 4 4+ 4+ 4+  Hip extension 4- 4 4- 4-  Hip abduction 4 4+ 4 4+  Hip adduction 4+ 4+    Hip internal rotation 4- p! Lateral knee 4+ 4 4+  Hip external rotation 4- p! Lateral knee 4 4+ 4+  Knee flexion 4+ 5 5 5   Knee extension 4+ 5 4 5   Ankle dorsiflexion 3+ 3+ 4- 4-  Ankle plantarflexion      Ankle inversion      Ankle eversion       (Blank rows = not tested)  LUMBAR SPECIAL TESTS:  Straight leg raise test: Positive    TODAY'S TREATMENT:  03/23/24 THERAPEUTIC EXERCISE: To improve strength, endurance, and flexibility. Nustep L6x64min UE/LE  TENS unit utilized to B thoracolumbar PS during session for pain modulation: NEUROMUSCULAR RE-EDUCATION: To improve balance, posture, and proprioception. Standing TrA +  shoulder row Blue TB x 20 Standing TrA +  shoulder extension  Blue TB x 20 Standing trunk rotation Blue TB x 10 B Standing pallof press Blue TB 2 x 10 B Clock balance from airex to floor 12 to 6 o'clock  Toe taps from airex to 8' step x 20 Ball toss and catch from airex Sit to stands x 15 Posture/body mechanics education 03/19/2024 THERAPEUTIC EXERCISE: To improve strength and endurance.   TM 2.5 mph x 10'  THERAPEUTIC ACTIVITIES: To improve functional performance.  Demonstration, verbal and tactile cues throughout  for technique. Modified Oswestry: 19 / 50 = 38.0 %, moderate disability  NEUROMUSCULAR RE-EDUCATION: To improve balance, coordination, kinesthesia, posture, and proprioception. Standing TrA +  shoulder row GTB x 10, Blue TB x 10 Standing TrA +  shoulder extension GTB x 10, Blue TB x 10  MODALITIES: Auvon TENS unit with 1 channel on each thoracolumbar paraspinals, intensity to pt tolerance during therapeutic exercises  MANUAL THERAPY: To promote normalized muscle tension, improved flexibility, pain modulation, and reduced pain utilizing connective tissue massage and therapeutic massage.  IASTM with foam roller to R>L lumbar paraspinals   03/13/24 THERAPEUTIC EXERCISE: To improve strength, endurance, and flexibility. Rec Bike- L4 x 10 min LE MMT Wall push up 2x10 Wall walk ups with ladder 5x  NEUROMUSCULAR RE-EDUCATION: Standing hip abduction RTB x 10 B Standing hip extension x 10 RTB B  MANUAL THERAPY: To promote normalized muscle tension, improved flexibility, and pain modulation utilizing connective tissue massage, therapeutic massage, and manual  TP therapy.  IASTM with foam roller to R>L lumbar paraspinals   03/06/24:  THERAPEUTIC EXERCISE: To improve strength, endurance, and flexibility. Rec Bike- L3 x 10 mins BATCA-Seated Rows 35#,2x10 BATCA-Seated Pull-downs 15#, 2x10  BATCA- Leg Press 35#, 2x10-removed TENS unit after   NEUROMUSCULAR RE-EDUCATION: To improve coordination, kinesthesia, posture, and proprioception (all w/ TENS unit on lower back)  3 way [employer] w/ 30s hold Seated Marches on flat table (TENS)  Seated marches on dynadisc w/ feet on 4 step  Seated marches on dynadisc w/ feet on 4 step + alternating opposite arm raises  Seated diagonals on dynadisc w/ yellow medball x10 B UE   MANUAL THERAPY: To promote normalized muscle tension, improved flexibility, and pain modulation utilizing connective tissue massage, therapeutic massage, and manual TP therapy.   IASTM with foam roller to R>L lumbar paraspinals, glutes, piriformis   MODALITIES:  TENS:  pt was shown how to use the unit & set the intensity at home if he were to purchase his own, used while doing exercises today.    03/02/24 THERAPEUTIC EXERCISE: To improve strength, endurance, and flexibility.   Rec Bike - L3 x 10 min  NEUROMUSCULAR RE-EDUCATION: To improve coordination, kinesthesia, posture, and proprioception. Seated yellow med ball diagonals x 20 bil Standing GTB pallof press x 10 bil Standing GTB trunk rotation x 10 bil Standing GTB 4-way SLR x 10 each bil, intermittent UE support on back of chair for balance  MANUAL THERAPY: To promote normalized muscle tension, improved flexibility, and pain modulation utilizing connective tissue massage, therapeutic massage, and manual TP therapy.  STM/DTM and IASTM with foam roller to R>L lumbar paraspinals and glutes/piriformis   02/27/24 NEUROMUSCULAR RE-EDUCATION: To improve kinesthesia, posture, and proprioception.  Hooklying clam blue TB + TrA 2x10 Bridge with orange pball x 10- mild pain KTC w/ orange pball x 20 Seated rows 35lb high grips x 10 chest support; x 10 no chest support Standing trunk rotation GTB x 20 B  THERAPEUTIC EXERCISE: To improve strength, endurance, ROM, and flexibility. Recumbent Bike L3x10 min  MANUAL THERAPY: To promote normalized muscle tension and for pain modulation  IASTM with foam roll to B lumbar paraspinals   02/24/2024 THERAPEUTIC EXERCISE: To improve strength, endurance, and flexibility.  Demonstration, verbal and tactile cues throughout for technique.  Rec Bike - L2 x 10 min Hooklying HS stretch with strap 3 x 30 bil Supine ITB stretch with strap 3 x 30 bil Supine bent knee cross-body glute stretch - deferred d/t increased LBP  NEUROMUSCULAR RE-EDUCATION: To improve coordination, kinesthesia, and posture. Hooklying TrA + alternating unilateral GTB hip ABD/ER bent-knee fallout 10 x  3 Bridge + GTB hip ABD isometric 10 x 3  MANUAL THERAPY: To promote normalized muscle tension and reduced pain utilizing connective tissue massage and therapeutic massage.  IASTM with foam roller to B lumbar paraspinals  SELF CARE: Provided education to prevent loss of gains achieved with physical therapy. Provided instruction in self-STM techniques to lumbar paraspinals using foam roller on wall.   02/17/2024 THERAPEUTIC EXERCISE: To improve strength and endurance.  Demonstration, verbal and tactile cues throughout for technique. Nustep L5 x 10' UE/LE for endurance  SELF CARE: Provided education to prevent loss of gains achieved with physical therapy and to prevent future decline in function. HEP review: Supine SLR stretch w/ strap x 1' x 2 BLE Supine cross over piriformis stretch x 1' x 2 RLE FABER piriformis stretch x 1' x 2 LLE  NEUROMUSCULAR RE-EDUCATION:  To improve posture and proprioception. Multifidus walkouts x 10 BLE Standing RTB pulls to side with core stabilization Step-ups with Ta's Seated ball rollouts BUE multi directional x 2' Supine ball rollouts BLE x 1' w/ TA Supine ball S/S BLE x 1' w/ TA Supine bridging x 30 w/ TA Supine pelvic tilts   02/12/24 THERAPEUTIC EXERCISE: To improve flexibility.  Demonstration, verbal and tactile cues throughout for technique. Nustep L5x8min UE/LE- for endurance Trunk rotations in sitting x 5 B Seated flexion pball rollout 10x3; 5x Rotation B  NEUROMUSCULAR RE-EDUCATION: To improve postural awareness and core activtion Lat pull downs 15lb 3x10 Seated rows 25lb 2x10 low grips Farmer walk with 10lb kettle bell x 2 laps unilateral OHP 10lb B 2x10 Multifidus walkout RTB 5x10    02/10/24 THERAPEUTIC EXERCISE: To improve flexibility.  Demonstration, verbal and tactile cues throughout for technique. Nustep L4x77min UE/LE- for endurance NEUROMUSCULAR RE-EDUCATION: To improve coordination, kinesthesia, posture, and  proprioception.  Seated ab sets orange pball 2x10 3 sec holds Seated pallof press RTB doubled 2x10 Seated rows RTB 2x10  Seated shoulder extension RTB 2x10 Standing heel/toe raise 10x3 B   02/03/2024 - Eval SELF CARE:  Reviewed eval findings and role of PT in addressing identified deficits as well as instruction in initial HEP (see below).   THERAPEUTIC EXERCISE: To improve flexibility.  Demonstration, verbal and tactile cues throughout for technique.  Supine and hooklying SKTC stretches x 15 each - deferred due to increased LBP R>L Hooklying KTOS piriformis stretch x 15 - deferred due to increased LBP R>L Hooklying LTR 2 x 10 - deferred due to increased LBP R>L Hooklying HS stretch with strap x 30 bil Supine ITB stretch with strap x 30 bil   PATIENT EDUCATION:  Education details: HEP update  Person educated: Patient Education method: Explanation, Demonstration, Verbal cues, and Handouts Education comprehension: verbalized understanding, returned demonstration, verbal cues required, and needs further education  HOME EXERCISE PROGRAM: Access Code: [email or web address] URL: [email or web address] Date: 02/10/2024 Prepared by: Sol Gaskins  Exercises - Supine Hamstring Stretch with Strap  - 2 x daily - 7 x weekly - 3 reps - 30 sec hold - Supine Iliotibial Band Stretch with Strap  - 2 x daily - 7 x weekly - 3 reps - 30 sec hold - Standing Shoulder Row with Anchored Resistance  - 1 x daily - 7 x weekly - 3 sets - 10 reps - Shoulder extension with resistance - Neutral  - 1 x daily - 7 x weekly - 3 sets - 10 reps - Standing Anti-Rotation Press with Anchored Resistance  - 1 x daily - 7 x weekly - 3 sets - 10 reps   ASSESSMENT:  CLINICAL IMPRESSION: Continued progressing core strengthening and incorporated balance activities today. Pt responded well to treatment. Instruction given and cues provided as needed to correct form with exercises. Erik Salazar is demonstrating progress  toward his PT goals and will benefit from continues skilled PT to address ongoing ROM and strength deficits to improve mobility and activity tolerance with decreased pain interference.   Eval: Erik Salazar is a 73 y.o. male who was referred to physical therapy for evaluation and treatment for acute on chronic LBP secondary to lumbar spondylolisthesis.  Patient reports onset of current midline low back pain beginning ~2 months ago without known MOI.  He is unable to isolate aggravating or relieving factors.  Patient has deficits in lumbar ROM, proximal LE flexibility, B LE strength, abnormal posture, and TTP with abnormal muscle tension  which are interfering with ADLs and are impacting quality of life.  On Modified Oswestry patient scored 25/50 demonstrating 50% or severe disability.  Erik Salazar will benefit from skilled PT to address above deficits to improve mobility and activity tolerance with decreased pain interference.  Attempt at initial HEP creation limited by increased pain.  PT referral included orders for TPDN - patient made aware of recent change in rehab policy requiring OOP cash pay for TPDN at time of service in order to remain in regulatory and legal compliance with billing.  OBJECTIVE IMPAIRMENTS: decreased activity tolerance, decreased endurance, decreased knowledge of condition, decreased mobility, difficulty walking, decreased ROM, decreased strength, increased fascial restrictions, impaired perceived functional ability, increased muscle spasms, impaired flexibility, improper body mechanics, postural dysfunction, and pain.  ACTIVITY LIMITATIONS: carrying, lifting, bending, sitting, standing, squatting, sleeping, stairs, transfers, bed mobility, locomotion level, and caring for others   PARTICIPATION LIMITATIONS: meal prep, cleaning, laundry, driving, shopping, community activity, and yard work  PERSONAL FACTORS: Age, Fitness, Past/current experiences, Time since onset of  injury/illness/exacerbation, and 3+ comorbidities: History chronic back pain, multiple back surgeries, ACDF, B TKA, RA and OA, DVT, CVA frontal lobe with memory deficits, seizures, open heart surgery to repair aneurism, CABG x 2, omental flap graft to mediastinum, hernia repair.  are also affecting patient's functional outcome.   REHAB POTENTIAL: Good  CLINICAL DECISION MAKING: Evolving/moderate complexity  EVALUATION COMPLEXITY: Moderate   GOALS: Goals reviewed with patient? Yes  SHORT TERM GOALS: Target date: 03/02/2024  Patient will be independent with initial HEP to improve outcomes and carryover.  Baseline: Partial initial HEP provided on eval but limited by pain Goal status: MET - 02/24/24   2.  Patient will report 25% improvement in low back pain to improve QOL. Baseline: 7/10 on eval, up to 10/10 at worst 02/24/24 - no significant change since eval 03/02/24 - pain increased since ESIs Goal status: MET - 03/06/24: pt reports 25% improvement in LBP    LONG TERM GOALS: Target date: 03/30/2024  Patient will be independent with ongoing/advanced HEP for self-management at home.  Baseline:  Goal status: IN PROGRESS - 03/17/24 - met for current HEP, anticipate further updates  2.  Patient will report 50-75% improvement in low back pain to improve QOL.  Baseline: 7/10 on eval, up to 10/10 at worst Goal status: IN PROGRESS - 03/17/24 - 10-15% improvement currently   3.  Patient to demonstrate ability to achieve and maintain good spinal alignment/posturing and body mechanics needed for daily activities. Baseline:  Goal status: IN PROGRESS  4.  Patient will demonstrate functional pain free lumbar ROM to perform ADLs.   Baseline: Refer to above lumbar ROM table Goal status: IN PROGRESS - 02/27/24  5.  Patient will demonstrate improved B LE strength to >/= 4+/5 for improved stability and ease of mobility. Baseline: Refer to above LE MMT table Goal status: IN PROGRESS - 03/13/24 - see  chart  6. Patient will report </= 38% on Modified Oswestry (MCID = 12%) to demonstrate improved functional ability with decreased pain interference. Baseline: 25 / 50 = 50.0 % Goal status: MET - 03/19/24 - 19 / 50 = 38.0 %  7.  Patient will tolerate 20-30 min of sitting or standing w/o increased pain to allow for  improved mobility and activity tolerance. Baseline: Per modified Oswestry pain limits sitting >1/2-hour and standing >10 minutes Goal status: IN PROGRESS   PLAN:  PT FREQUENCY: 2x/week  PT DURATION: 6-8 weeks  PLANNED INTERVENTIONS: [id number]- PT  Re-evaluation, 97750- Physical Performance Testing, 97110-Therapeutic exercises, 97530- Therapeutic activity, [id number]- Neuromuscular re-education, [phone]- Self Care, [id number]- Manual therapy, [phone]- Gait training, [phone]- Aquatic Therapy, [phone]- Electrical stimulation (unattended), 97035- Ultrasound, 20560 (1-2 muscles), 20561 (3+ muscles)- Dry Needling, Patient/Family education, Balance training, Taping, Joint mobilization, Spinal mobilization, Cryotherapy, and Moist heat  PLAN FOR NEXT SESSION:  give Posture and body mechanics handout; Incorporate more lumbar rotational and side bend motion gently; Continue machine weight training with core stabilization;  Ball stabilization exercises.  Consider possible e-stim/TENS and/or TPDN (both discussed with patient, pt tried TENS - try again and provide info on home unit if interested?).       Alayne Estrella L Harris Kistler, PTA 03/23/2024, 9:40 AM

## 2024-03-26 ENCOUNTER — Ambulatory Visit: Admitting: Physical Therapy

## 2024-03-30 ENCOUNTER — Other Ambulatory Visit: Payer: Self-pay

## 2024-03-30 ENCOUNTER — Other Ambulatory Visit: Payer: Self-pay | Admitting: Cardiology

## 2024-03-31 ENCOUNTER — Other Ambulatory Visit: Payer: Self-pay

## 2024-03-31 ENCOUNTER — Other Ambulatory Visit (HOSPITAL_BASED_OUTPATIENT_CLINIC_OR_DEPARTMENT_OTHER): Payer: Self-pay

## 2024-03-31 ENCOUNTER — Ambulatory Visit: Admitting: Physical Therapy

## 2024-03-31 ENCOUNTER — Encounter: Payer: Self-pay | Admitting: Physical Therapy

## 2024-03-31 DIAGNOSIS — M62830 Muscle spasm of back: Secondary | ICD-10-CM

## 2024-03-31 DIAGNOSIS — M5459 Other low back pain: Secondary | ICD-10-CM

## 2024-03-31 DIAGNOSIS — M6281 Muscle weakness (generalized): Secondary | ICD-10-CM

## 2024-03-31 MED ORDER — PRAVASTATIN SODIUM 40 MG PO TABS
40.0000 mg | ORAL_TABLET | Freq: Every evening | ORAL | 3 refills | Status: AC
  Filled 2024-03-31: qty 90, 90d supply, fill #0
  Filled 2024-06-26: qty 90, 90d supply, fill #1
  Filled 2024-09-25: qty 90, 90d supply, fill #2

## 2024-03-31 NOTE — Therapy (Signed)
 OUTPATIENT PHYSICAL THERAPY TREATMENT / RECERTIFICATION  Progress Note  Reporting Period 03/19/2024 to 03/31/2024  See note below for Objective Data and Assessment of Progress/Goals.      Patient Name: Erik Salazar MRN: [id number] DOB:[birth year], 73 y.o., male Today's Date: 03/31/2024  END OF SESSION:  PT End of Session - 03/31/24 0928     Visit Number 12    Date for PT Re-Evaluation 05/12/24    Authorization Type [employer]    Progress Note Due on Visit 22   Recert on visit #12 - 03/31/24   PT Start Time 0928    PT Stop Time 1015    PT Time Calculation (min) 47 min    Activity Tolerance Patient tolerated treatment well    Behavior During Therapy Erik Salazar for tasks assessed/performed              Past Medical History:  Diagnosis Date   Allergy see attached info   Arthritis    RA and OA   Blood transfusion without reported diagnosis    with 2nd heart surgery   Carpal tunnel syndrome on right 07/08/2017   Cervical radiculopathy at C6 11/14/2017   Right   Complication of anesthesia    Coronary artery disease    Enlarged prostate    Fibromyalgia    H/O: knee surgery    15    Hayfever    WHEN YOUNG   History of blood clots    History of kidney stones    History of open heart surgery    ASCENDING AORTIC ANEURYSM   Ischemic stroke of frontal lobe (HCC) 03/14/2017   Bilateral; post-redo CT surgery   Pneumonia    PONV (postoperative nausea and vomiting)    only after CABG surgeries   Seizure disorder (HCC) 03/14/2017   Seizures (HCC)    last one 02/2021,updated 09/25/22   Status post knee surgery    DVT POST KNEE SURGERY   Stroke [facility])    Past Surgical History:  Procedure Laterality Date   ANTERIOR CERVICAL DECOMP/DISCECTOMY FUSION N/A 05/04/2020   Procedure: Anterior Cervical Decompression/Discectomy Fuion Cervical three-four, Cervical four-five, Cervical five-six;  Surgeon: Onetha Kuba, MD;  Location: [facility] OR;  Service: Neurosurgery;  Laterality: N/A;   BACK  SURGERY     lower back 06/28/2021   BALLOON DILATION N/A 06/23/2019   Procedure: BALLOON DILATION;  Surgeon: Elicia Claw, MD;  Location: [facility];  Service: Gastroenterology;  Laterality: N/A;   BENTALL PROCEDURE  01/04/2016   Bentall with 23 mm pericardial AVR; SVG-LAD, SVG-CX [facility] [place])   BICEPS TENDON REPAIR Right    BIOPSY  06/23/2019   Procedure: BIOPSY;  Surgeon: Elicia Claw, MD;  Location: [facility];  Service: Gastroenterology;;   CARDIAC SURGERY     ANUERSYM MAY 2017   Bentall procedure. Bioprosthetic aortic valve #23 mm bovine model #2700 TF ask, and 28 mm Gelweave woven vascular sinus of Valsalva graft   CARDIAC VALVE REPLACEMENT  02/2016, 02/2017   CARPAL TUNNEL RELEASE  08/2018   right hand    COLONOSCOPY WITH PROPOFOL  N/A 10/22/2022   Procedure: COLONOSCOPY WITH PROPOFOL ;  Surgeon: San Sandor GAILS, DO;  Location: [facility];  Service: Gastroenterology;  Laterality: N/A;   CORONARY ARTERY BYPASS GRAFT  01/04/2016   VG to LAD & VG to LCX   CORONARY ARTERY BYPASS GRAFT  03/13/2017   LIMA to LAD with steril abcess removal from dacron graft   CYSTOSCOPY/URETEROSCOPY/HOLMIUM LASER/STENT PLACEMENT Left 06/12/2022   Procedure: CYSTOSCOPY/URETEROSCOPY/HOLMIUM LASER/STENT  PLACEMENT;  Surgeon: Carolee Sherwood JONETTA DOUGLAS, MD;  Location: [facility];  Service: Urology;  Laterality: Left;   ESOPHAGOGASTRODUODENOSCOPY     Had done dilatation done about 2 or 3 times before in [place]   ESOPHAGOGASTRODUODENOSCOPY (EGD) WITH PROPOFOL  N/A 06/23/2019   Procedure: ESOPHAGOGASTRODUODENOSCOPY (EGD) WITH PROPOFOL ;  Surgeon: Elicia Claw, MD;  Location: [facility];  Service: Gastroenterology;  Laterality: N/A;   FALSE ANEURYSM REPAIR  03/13/2017   redo sternotomy, sterile abscess removal from Dacron graft, omental flap around aorta, CABG: LIMA-LAD ([name], Dr. Zachary Remington)   GREATER OMENTAL FLAP CLOSURE  2018   Of pericardium 2018   INSERTION OF MESH  10/14/2020    Procedure: INSERTION OF MESH;  Surgeon: Sheldon Standing, MD;  Location: [facility] OR;  Service: General;;   JOINT REPLACEMENT  knee   knee surgeries     13 surgeries on knee done before knee replacement    KNEE SURGERY  1983   LAPAROSCOPIC LYSIS OF ADHESIONS N/A 05/19/2021   Procedure: LYSIS OF ADHESIONS;  Surgeon: Sheldon Standing, MD;  Location: [facility] OR;  Service: General;  Laterality: N/A;  GEN & LOCAL   LYSIS OF ADHESION N/A 10/14/2020   Procedure: LYSIS OF ADHESION;  Surgeon: Sheldon Standing, MD;  Location: [facility] OR;  Service: General;  Laterality: N/A;   open heart surgery     03-13-2017   REPLACEMENT TOTAL KNEE Left 2015   ROTATOR CUFF REPAIR Right    done with biceps tendon repair   SPINE SURGERY  upper neck, lower spine   TONSILLECTOMY     removed as a child.   TOTAL KNEE ARTHROPLASTY Right 01/18/2022   Procedure: RIGHT TOTAL KNEE ARTHROPLASTY;  Surgeon: Jerri Kay HERO, MD;  Location: [facility] OR;  Service: Orthopedics;  Laterality: Right;   VENTRAL HERNIA REPAIR N/A 10/14/2020   Procedure: LAPAROSCOPIC VENTRAL HERNIA REPAIR WITH TAP BLOCK BILATERAL;  Surgeon: Sheldon Standing, MD;  Location: [facility];  Service: General;  Laterality: N/A;   VENTRAL HERNIA REPAIR N/A 05/19/2021   Procedure: LAPAROSCOPIC VENTRAL WALL HERNIA REPAIR;  Surgeon: Sheldon Standing, MD;  Location: [facility] OR;  Service: General;  Laterality: N/A;   Patient Active Problem List   Diagnosis Date Noted   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   Constipation 10/22/2022   Hypertrophied anal papilla 10/22/2022   Colon cancer screening 10/22/2022   Postop check 03/26/2022   Gait abnormality 01/29/2022   Status post total right knee replacement 01/18/2022   Coronary artery disease involving native coronary artery of native heart without angina pectoris 01/04/2022   Preop cardiovascular exam 01/04/2022   Primary osteoarthritis of right knee 01/04/2022   Acute right-sided low back pain without sciatica 12/25/2021   Aortic  atherosclerosis (HCC) 09/04/2021   Spondylolisthesis at L4-L5 level 06/28/2021   S/P repair of ventral hernia 05/19/2021   S/P repair of recurrent ventral hernia 05/19/2021   Benign prostatic hyperplasia without lower urinary tract symptoms 10/14/2020   Chronic pain 10/14/2020   ED (erectile dysfunction) of organic origin 10/14/2020   Esophageal dysphagia 10/14/2020   Gastroesophageal reflux disease 10/14/2020   History of aortic valve replacement with bioprosthetic valve 10/14/2020   Hx of aortic aneurysm repair 10/14/2020   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   Thoracic aortic aneurysm without rupture (HCC) 10/14/2020   Recurrent incisional hernia 10/14/2020   Spinal stenosis in cervical region 05/04/2020   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   Cervical radiculopathy at C6 11/14/2017   Carpal tunnel syndrome on right 07/08/2017  Tremor, essential 07/08/2017   Ischemic stroke of frontal lobe (HCC) 04/05/2017   Pain in right hand 04/05/2017   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 03/27/2017   Seizure (HCC) 03/14/2017   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/13/2017   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HCC) 03/12/2017   Essential hypertension 02/26/2017   Mixed hyperlipidemia 02/26/2017    PCP: Jason Leita Repine, FNP   REFERRING PROVIDER: Onetha Kuba, MD   REFERRING DIAG: M43.16 (ICD-10-CM) - Spondylolisthesis, lumbar region   THERAPY DIAG:  Other low back pain  Muscle spasm of back  Muscle weakness (generalized)  RATIONALE FOR EVALUATION AND TREATMENT: Rehabilitation  ONSET DATE: Acute on chronic x ~2 months  NEXT MD VISIT: ~05/04/2024   SUBJECTIVE:                                                                                                                                                                                                         SUBJECTIVE STATEMENT: Pt reports he missed the last visit due to not feeling well after having a seizure earlier in the  day.  He feels that things are improving but still experiences midline low back/lumbar spine pain nearly all the time.  PAIN: Are you having pain? Yes: NPRS scale: 4/10   Pain location: Midline lumbar spine  Pain description: steady/constant aching or throbbing  Aggravating factors: prolonged sitting or standing, riding in the car  Relieving factors: changing positions, TENS during exercises, foam rolling to back   PERTINENT HISTORY:  History chronic back pain, multiple back surgeries, ACDF, B TKA, RA and OA, DVT, CVA frontal lobe with memory deficits, seizures, open heart surgery to repair aneurism, CABG x 2, omental flap graft to mediastinum, hernia repair.   PRECAUTIONS: None  RED FLAGS: None  WEIGHT BEARING RESTRICTIONS: No  FALLS:  Has patient fallen in last 6 months? No  LIVING ENVIRONMENT:  Lives with: lives with their spouse Lives in: House/apartment Stairs: No Has following equipment at home: Single point cane, Walker - 2 wheeled, and bed side commode  OCCUPATION: Retired  PLOF: Independent and Leisure: exercises frequently - walk treadmill    PATIENT GOALS: Take away the pain.   OBJECTIVE: (objective measures completed at initial evaluation unless otherwise dated)  DIAGNOSTIC FINDINGS:  01/26/24 - MR Lumbar Spine: Results still pending as of 02/03/2024  04/08/21 - MR Lumbar Spine:  IMPRESSION: No change is appreciated compared to the study of last year.   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 Pseudo disc herniation. Bilateral foraminal stenosis could possibly affect  either or both exiting L5 nerves.   L3-4: Mild multifactorial stenosis secondary to bulging of the disc and mild facet and ligamentous prominence. No visible neural compression.   Other levels show disc bulges and facet degeneration but no apparent neural compressive stenosis.  PATIENT SURVEYS:  Modified Oswestry 25 / 50 = 50.0 %, severe disability  03/19/24: 19 / 50 =  38.0 %, moderate disability  SCREENING FOR RED FLAGS: Bowel or bladder incontinence: No Spinal tumors: No Cauda equina syndrome: No Compression fracture: No Abdominal aneurysm: No  COGNITION:  Overall cognitive status: Impaired - STM   SENSATION: WFL Numbness in hands from prior CVA  POSTURE:  decreased lumbar lordosis  PALPATION: TTP midline lumbar spine and over prior surgical incision. Increased muscle tension in B lumbar paraspinals but denies TTP.  LUMBAR ROM:   Active  Eval - p! w/ all motions 02/27/24 03/31/24  Flexion Hands to knees Hands to ankles Hands to ankles  Extension 60% limited 50% limited 25% limited  Right lateral flexion Hand to lateral knee Lateral knee Hand to mid calf - p! R midline  Left lateral flexion Hand to lateral knee Lateral knee Hand to mid calf  Right rotation 40% limited  25% limited - p! R midline  Left rotation 40% limited  15% limited  (Blank rows = not tested)  MUSCLE LENGTH: Hamstrings: mod tight R, mild tight L ITB: mod tight R, mild tight L Piriformis: mod/severe tight B Hip flexors: mod tight B Quads: mod tight B Heelcord:   LOWER EXTREMITY ROM:    Limited d/t h/o TKA and low back pain   LOWER EXTREMITY MMT:    MMT Right eval Left eval R 03/13/24 L 03/13/24 R 03/31/24 L 03/31/24  Hip flexion 4 4+ 4+ 4+ 4+ 4+  Hip extension 4- 4 4- 4- 4 4-  Hip abduction 4 4+ 4 4+ 4+ 4  Hip adduction 4+ 4+   4+ 4+  Hip internal rotation 4- p! Lateral knee 4+ 4 4+ 4 p! lateral knee 4+  Hip external rotation 4- p! Lateral knee 4 4+ 4+ 4 p! lateral knee 4+  Knee flexion 4+ 5 5 5 5 5   Knee extension 4+ 5 4 5 5 5   Ankle dorsiflexion 3+ 3+ 4- 4- 4 4  Ankle plantarflexion        Ankle inversion        Ankle eversion         (Blank rows = not tested)  LUMBAR SPECIAL TESTS:  Straight leg raise test: Positive   TODAY'S TREATMENT:   03/31/24 THERAPEUTIC EXERCISE: To improve strength, endurance, and ROM.  Demonstration, verbal and tactile cues  throughout for technique. Rec Bike - L4 x 10 min Verbal HEP review Bridge x 10 Bridge + GTB hip ABD x 10 - pt reporting better awareness of glute activation Standing hip extension with slight fwd lean   THERAPEUTIC ACTIVITIES: To improve functional performance.  Demonstration, verbal and tactile cues throughout for technique. Lumbar ROM assessment LE MMT Goal assessment  SELF CARE: Provided education to increase independence with ADLs and to facilitate performance of basic household cleaning/chores.  Provided education in proper posture and body mechanics for typical daily positioning and household chores to minimize strain on low back and neck. Information provided on home TENS unit option (unit used in clinic during exercises).   03/23/24 THERAPEUTIC EXERCISE: To improve strength, endurance, and flexibility. Nustep L6x52min UE/LE  TENS unit utilized to B thoracolumbar PS during session  for pain modulation: NEUROMUSCULAR RE-EDUCATION: To improve balance, posture, and proprioception. Standing TrA +  shoulder row Blue TB x 20 Standing TrA +  shoulder extension  Blue TB x 20 Standing trunk rotation Blue TB x 10 B Standing pallof press Blue TB 2 x 10 B Clock balance from airex to floor 12 to 6 o'clock  Toe taps from airex to 8' step x 20 Ball toss and catch from airex Sit to stands x 15 Posture/body mechanics education   03/19/2024 THERAPEUTIC EXERCISE: To improve strength and endurance.   TM 2.5 mph x 10'  THERAPEUTIC ACTIVITIES: To improve functional performance.  Demonstration, verbal and tactile cues throughout for technique. Modified Oswestry: 19 / 50 = 38.0 %, moderate disability  NEUROMUSCULAR RE-EDUCATION: To improve balance, coordination, kinesthesia, posture, and proprioception. Standing TrA +  shoulder row GTB x 10, Blue TB x 10 Standing TrA +  shoulder extension GTB x 10, Blue TB x 10  MODALITIES: Auvon TENS unit with 1 channel on each thoracolumbar paraspinals,  intensity to pt tolerance during therapeutic exercises  MANUAL THERAPY: To promote normalized muscle tension, improved flexibility, pain modulation, and reduced pain utilizing connective tissue massage and therapeutic massage.  IASTM with foam roller to R>L lumbar paraspinals   PATIENT EDUCATION:  Education details: progress with PT, ongoing PT POC, HEP review, HEP update - hip extensor strengthening, posture and body mechanics for typical daily postioning, mobility and household tasks, and home TENS unit options  Person educated: Patient Education method: Explanation, Demonstration, Verbal cues, and Handouts Education comprehension: verbalized understanding, returned demonstration, verbal cues required, and needs further education  HOME EXERCISE PROGRAM: Access Code: [email or web address] URL: [email or web address] Date: 03/31/2024 Prepared by: Erik Salazar Hidden  Exercises - Supine Hamstring Stretch with Strap  - 2 x daily - 7 x weekly - 3 reps - 30 sec hold - Supine Iliotibial Band Stretch with Strap  - 2 x daily - 7 x weekly - 3 reps - 30 sec hold - Standing Shoulder Row with Anchored Resistance  - 1 x daily - 7 x weekly - 3 sets - 10 reps - Shoulder extension with resistance - Neutral  - 1 x daily - 7 x weekly - 3 sets - 10 reps - Standing Anti-Rotation Press with Anchored Resistance  - 1 x daily - 7 x weekly - 3 sets - 10 reps - Hooklying Clamshell with Resistance  - 1 x daily - 7 x weekly - 2 sets - 10 reps - 3 sec hold - Bridge with Hip Abduction and Resistance - Ground Touches  - 1 x daily - 3-4 x weekly - 2 sets - 10 reps - 3 sec hold - Prone Hip Extension on Table  - 1 x daily - 3-4 x weekly - 2 sets - 10 reps - 3 sec hold  Patient Education - Posture and Body Mechanics   ASSESSMENT:  CLINICAL IMPRESSION: Erik Salazar reports 20-25% improvement in low back/lumbar spine pain since eval with pain temporarily increased for a while following his ESIs. He continues to experience  steady/near constant pain in the midline lumbar spine, slightly more pronounced to R of spine with continued TTP and tightness in R>L lumbar paraspinals.  He reports TPDN only gave him very short-term relief on previous PT episode and does not wish to pay the OOP cost for TPDN with only brief relief noted.  Lumbar ROM has improved with increased pain still present with extension > flexion as well as R rotation and side bending. Overall  LE strength has improved but continued weakness still evident in hip extensors, R hip IR/ER (limited by R lateral knee pain), L hip ABD and B ankle DF.  HEP reviewed and updated to increase emphasis on hip extensor strengthening.  We also reviewed proper posture and body mechanics for typical daily positioning and household chores to minimize strain on low back and neck, with handout provided for home reference.  Erik Salazar is demonstrating progress toward his PT goals and will benefit from continued skilled PT to address ongoing ROM and strength deficits to improve mobility and activity tolerance with decreased pain interference, therefore will recommend recert for additional 2x/wk for up to 4-6 weeks. .   Eval: Erik Salazar is a 73 y.o. male who was referred to physical therapy for evaluation and treatment for acute on chronic LBP secondary to lumbar spondylolisthesis.  Patient reports onset of current midline low back pain beginning ~2 months ago without known MOI.  He is unable to isolate aggravating or relieving factors.  Patient has deficits in lumbar ROM, proximal LE flexibility, B LE strength, abnormal posture, and TTP with abnormal muscle tension  which are interfering with ADLs and are impacting quality of life.  On Modified Oswestry patient scored 25/50 demonstrating 50% or severe disability.  [place] will benefit from skilled PT to address above deficits to improve mobility and activity tolerance with decreased pain interference.  Attempt at initial HEP creation limited by  increased pain.  PT referral included orders for TPDN - patient made aware of recent change in rehab policy requiring OOP cash pay for TPDN at time of service in order to remain in regulatory and legal compliance with billing.  OBJECTIVE IMPAIRMENTS: decreased activity tolerance, decreased endurance, decreased knowledge of condition, decreased mobility, difficulty walking, decreased ROM, decreased strength, increased fascial restrictions, impaired perceived functional ability, increased muscle spasms, impaired flexibility, improper body mechanics, postural dysfunction, and pain.  ACTIVITY LIMITATIONS: carrying, lifting, bending, sitting, standing, squatting, sleeping, stairs, transfers, bed mobility, locomotion level, and caring for others   PARTICIPATION LIMITATIONS: meal prep, cleaning, laundry, driving, shopping, community activity, and yard work  PERSONAL FACTORS: Age, Fitness, Past/current experiences, Time since onset of injury/illness/exacerbation, and 3+ comorbidities: History chronic back pain, multiple back surgeries, ACDF, B TKA, RA and OA, DVT, CVA frontal lobe with memory deficits, seizures, open heart surgery to repair aneurism, CABG x 2, omental flap graft to mediastinum, hernia repair.  are also affecting patient's functional outcome.   REHAB POTENTIAL: Good  CLINICAL DECISION MAKING: Evolving/moderate complexity  EVALUATION COMPLEXITY: Moderate   GOALS: Goals reviewed with patient? Yes  SHORT TERM GOALS: Target date: 03/02/2024  Patient will be independent with initial HEP to improve outcomes and carryover.  Baseline: Partial initial HEP provided on eval but limited by pain Goal status: MET - 02/24/24   2.  Patient will report 25% improvement in low back pain to improve QOL. Baseline: 7/10 on eval, up to 10/10 at worst 02/24/24 - no significant change since eval 03/02/24 - pain increased since ESIs Goal status: MET - 03/06/24 - pt reports 25% improvement in LBP    LONG  TERM GOALS: Target date: 03/30/2024, extended to 05/12/2024  Patient will be independent with ongoing/advanced HEP for self-management at home.  Baseline:  03/17/24 - met for current HEP, anticipate further updates Goal status: IN PROGRESS - 03/31/24 - HEP reviewed and updated  2.  Patient will report 50-75% improvement in low back pain to improve QOL.  Baseline: 7/10 on eval, up to  10/10 at worst 03/17/24 - 10-15% improvement currently  Goal status: IN PROGRESS - 03/31/24 - 20-25% improvement  3.  Patient to demonstrate ability to achieve and maintain good spinal alignment/posturing and body mechanics needed for daily activities. Baseline:  Goal status: IN PROGRESS - 03/31/24 - reviewed posture and body mechanics for typical daily activities with handout provided  4.  Patient will demonstrate functional pain free lumbar ROM to perform ADLs.   Baseline: Refer to above lumbar ROM table Goal status: IN PROGRESS - 03/31/24 - Improving ROM with pain now only associated with extension > flexion and R side-bending and rotation  5.  Patient will demonstrate improved B LE strength to >/= 4+/5 for improved stability and ease of mobility. Baseline: Refer to above LE MMT table Goal status: PARTIALLY MET - 03/31/24 - continued weakness in B hip extension and some lateral motions  6. Patient will report </= 38% on Modified Oswestry (MCID = 12%) to demonstrate improved functional ability with decreased pain interference. Baseline: 25 / 50 = 50.0 % Goal status: MET - 03/19/24 - 19 / 50 = 38.0 %  7.  Patient will tolerate 20-30 min of sitting or standing w/o increased pain to allow for  improved mobility and activity tolerance. Baseline: Per modified Oswestry pain limits sitting >1/2-hour and standing >10 minutes Goal status: IN PROGRESS - 03/31/24 - Pt reports he is able to sit for 20-30 minutes before needing to change position due to pain   PLAN:  PT FREQUENCY: 2x/week  PT DURATION: 6-8 weeks  PLANNED  INTERVENTIONS: 02835- PT Re-evaluation, 97750- Physical Performance Testing, 97110-Therapeutic exercises, 97530- Therapeutic activity, [id number]- Neuromuscular re-education, 97535- Self Care, [id number]- Manual therapy, [id number]- Gait training, [phone]- Aquatic Therapy, [phone]- Electrical stimulation (unattended), 97035- Ultrasound, [id number] (1-2 muscles), [id number] (3+ muscles)- Dry Needling, Patient/Family education, Balance training, Taping, Joint mobilization, Spinal mobilization, Cryotherapy, and Moist heat  PLAN FOR NEXT SESSION:  Incorporate more lumbar rotational and side bend motion gently; Progress core/lumbopelvic (emphasis on hip extension) and ankle strengthening; Continue machine weight training with core stabilization;  Ball stabilization exercises.  E-stim/TENS as benefit noted - provide education in self-setup for home unit PRN if pt purchases TENS unit.       Erik Salazar CHRISTELLA Hidden, PT 03/31/2024, 10:35 AM

## 2024-04-01 ENCOUNTER — Ambulatory Visit (HOSPITAL_COMMUNITY)
Admission: RE | Admit: 2024-04-01 | Discharge: 2024-04-01 | Disposition: A | Discharge status: Home / Self Care | Source: Ambulatory Visit | Attending: Cardiology | Admitting: Cardiology

## 2024-04-01 DIAGNOSIS — I700 Atherosclerosis of aorta: Secondary | ICD-10-CM | POA: Insufficient documentation

## 2024-04-01 DIAGNOSIS — T82867A Thrombosis of cardiac prosthetic devices, implants and grafts, initial encounter: Secondary | ICD-10-CM | POA: Insufficient documentation

## 2024-04-01 DIAGNOSIS — Z952 Presence of prosthetic heart valve: Secondary | ICD-10-CM | POA: Diagnosis not present

## 2024-04-01 DIAGNOSIS — I7121 Aneurysm of the ascending aorta, without rupture: Secondary | ICD-10-CM | POA: Diagnosis present

## 2024-04-01 MED ORDER — IOHEXOL 350 MG/ML SOLN
75.0000 mL | Freq: Once | INTRAVENOUS | Status: AC | PRN
  Administered 2024-04-01 (×2): 75 mL via INTRAVENOUS

## 2024-04-02 ENCOUNTER — Ambulatory Visit: Admitting: Physical Therapy

## 2024-04-02 ENCOUNTER — Encounter: Payer: Self-pay | Admitting: Physical Therapy

## 2024-04-02 DIAGNOSIS — M62830 Muscle spasm of back: Secondary | ICD-10-CM

## 2024-04-02 DIAGNOSIS — M6281 Muscle weakness (generalized): Secondary | ICD-10-CM

## 2024-04-02 DIAGNOSIS — M5459 Other low back pain: Secondary | ICD-10-CM | POA: Diagnosis not present
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
## 2024-04-03 ENCOUNTER — Ambulatory Visit: Admitting: Podiatry

## 2024-04-06 ENCOUNTER — Ambulatory Visit

## 2024-04-06 DIAGNOSIS — M5459 Other low back pain: Secondary | ICD-10-CM

## 2024-04-06 DIAGNOSIS — M6281 Muscle weakness (generalized): Secondary | ICD-10-CM

## 2024-04-06 DIAGNOSIS — R252 Cramp and spasm: Secondary | ICD-10-CM

## 2024-04-06 DIAGNOSIS — M62830 Muscle spasm of back: Secondary | ICD-10-CM

## 2024-04-06 DIAGNOSIS — G8929 Other chronic pain: Secondary | ICD-10-CM

## 2024-04-06 NOTE — Therapy (Signed)
 OUTPATIENT PHYSICAL THERAPY TREATMENT      Patient Name: Erik Salazar MRN: [id number] DOB:Nov 26, 1950, 73 y.o., male Today's Date: 04/06/2024  END OF SESSION:  PT End of Session - 04/06/24 0926     Visit Number 14    Date for PT Re-Evaluation 05/12/24    Authorization Type [employer]    Progress Note Due on Visit 22    PT Start Time [phone]    PT Stop Time 0930    PT Time Calculation (min) 43 min    Activity Tolerance Patient tolerated treatment well    Behavior During Therapy [facility] for tasks assessed/performed               Past Medical History:  Diagnosis Date   Allergy see attached info   Arthritis    RA and OA   Blood transfusion without reported diagnosis    with 2nd heart surgery   Carpal tunnel syndrome on right 07/08/2017   Cervical radiculopathy at C6 11/14/2017   Right   Complication of anesthesia    Coronary artery disease    Enlarged prostate    Fibromyalgia    H/O: knee surgery    15    Hayfever    WHEN YOUNG   History of blood clots    History of kidney stones    History of open heart surgery    ASCENDING AORTIC ANEURYSM   Ischemic stroke of frontal lobe (HCC) 03/14/2017   Bilateral; post-redo CT surgery   Pneumonia    PONV (postoperative nausea and vomiting)    only after CABG surgeries   Seizure disorder (HCC) 03/14/2017   Seizures (HCC)    last one 02/2021,updated 09/25/22   Status post knee surgery    DVT POST KNEE SURGERY   Stroke [facility])    Past Surgical History:  Procedure Laterality Date   ANTERIOR CERVICAL DECOMP/DISCECTOMY FUSION N/A 05/04/2020   Procedure: Anterior Cervical Decompression/Discectomy Fuion Cervical three-four, Cervical four-five, Cervical five-six;  Surgeon: Onetha Kuba, MD;  Location: [facility] OR;  Service: Neurosurgery;  Laterality: N/A;   BACK SURGERY     lower back 06/28/2021   BALLOON DILATION N/A 06/23/2019   Procedure: BALLOON DILATION;  Surgeon: Elicia Claw, MD;  Location: [facility] ENDOSCOPY;  Service:  Gastroenterology;  Laterality: N/A;   BENTALL PROCEDURE  01/04/2016   Bentall with 23 mm pericardial AVR; SVG-LAD, SVG-CX [facility], [place])   BICEPS TENDON REPAIR Right    BIOPSY  06/23/2019   Procedure: BIOPSY;  Surgeon: Elicia Claw, MD;  Location: [facility]OSCOPY;  Service: Gastroenterology;;   CARDIAC SURGERY     ANUERSYM MAY 2017   Bentall procedure. Bioprosthetic aortic valve #23 mm bovine model #2700 TF ask, and 28 mm Gelweave woven vascular sinus of Valsalva graft   CARDIAC VALVE REPLACEMENT  02/2016, 02/2017   CARPAL TUNNEL RELEASE  08/2018   right hand    COLONOSCOPY WITH PROPOFOL  N/A 10/22/2022   Procedure: COLONOSCOPY WITH PROPOFOL ;  Surgeon: San Sandor GAILS, DO;  Location: [facility];  Service: Gastroenterology;  Laterality: N/A;   CORONARY ARTERY BYPASS GRAFT  01/04/2016   VG to LAD & VG to LCX   CORONARY ARTERY BYPASS GRAFT  03/13/2017   LIMA to LAD with steril abcess removal from dacron graft   CYSTOSCOPY/URETEROSCOPY/HOLMIUM LASER/STENT PLACEMENT Left 06/12/2022   Procedure: CYSTOSCOPY/URETEROSCOPY/HOLMIUM LASER/STENT PLACEMENT;  Surgeon: Carolee Sherwood JONETTA DOUGLAS, MD;  Location: [facility] ORS;  Service: Urology;  Laterality: Left;   ESOPHAGOGASTRODUODENOSCOPY     Had done dilatation  done about 2 or 3 times before in [place]   ESOPHAGOGASTRODUODENOSCOPY (EGD) WITH PROPOFOL  N/A 06/23/2019   Procedure: ESOPHAGOGASTRODUODENOSCOPY (EGD) WITH PROPOFOL ;  Surgeon: Elicia Claw, MD;  Location: [facility];  Service: Gastroenterology;  Laterality: N/A;   FALSE ANEURYSM REPAIR  03/13/2017   redo sternotomy, sterile abscess removal from Dacron graft, omental flap around aorta, CABG: LIMA-LAD ([name], Dr. Zachary Remington)   GREATER OMENTAL FLAP CLOSURE  2018   Of pericardium 2018   INSERTION OF MESH  10/14/2020   Procedure: INSERTION OF MESH;  Surgeon: Sheldon Standing, MD;  Location: [facility] OR;  Service: General;;   JOINT REPLACEMENT  knee   knee surgeries     13 surgeries on knee  done before knee replacement    KNEE SURGERY  1983   LAPAROSCOPIC LYSIS OF ADHESIONS N/A 05/19/2021   Procedure: LYSIS OF ADHESIONS;  Surgeon: Sheldon Standing, MD;  Location: [facility] OR;  Service: General;  Laterality: N/A;  GEN & LOCAL   LYSIS OF ADHESION N/A 10/14/2020   Procedure: LYSIS OF ADHESION;  Surgeon: Sheldon Standing, MD;  Location: [facility] OR;  Service: General;  Laterality: N/A;   open heart surgery     03-13-2017   REPLACEMENT TOTAL KNEE Left 2015   ROTATOR CUFF REPAIR Right    done with biceps tendon repair   SPINE SURGERY  upper neck, lower spine   TONSILLECTOMY     removed as a child.   TOTAL KNEE ARTHROPLASTY Right 01/18/2022   Procedure: RIGHT TOTAL KNEE ARTHROPLASTY;  Surgeon: Jerri Kay HERO, MD;  Location: [facility] OR;  Service: Orthopedics;  Laterality: Right;   VENTRAL HERNIA REPAIR N/A 10/14/2020   Procedure: LAPAROSCOPIC VENTRAL HERNIA REPAIR WITH TAP BLOCK BILATERAL;  Surgeon: Sheldon Standing, MD;  Location: [facility] OR;  Service: General;  Laterality: N/A;   VENTRAL HERNIA REPAIR N/A 05/19/2021   Procedure: LAPAROSCOPIC VENTRAL WALL HERNIA REPAIR;  Surgeon: Sheldon Standing, MD;  Location: [facility] OR;  Service: General;  Laterality: N/A;   Patient Active Problem List   Diagnosis Date Noted   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   Constipation 10/22/2022   Hypertrophied anal papilla 10/22/2022   Colon cancer screening 10/22/2022   Postop check 03/26/2022   Gait abnormality 01/29/2022   Status post total right knee replacement 01/18/2022   Coronary artery disease involving native coronary artery of native heart without angina pectoris 01/04/2022   Preop cardiovascular exam 01/04/2022   Primary osteoarthritis of right knee 01/04/2022   Acute right-sided low back pain without sciatica 12/25/2021   Aortic atherosclerosis (HCC) 09/04/2021   Spondylolisthesis at L4-L5 level 06/28/2021   S/P repair of ventral hernia 05/19/2021   S/P repair of recurrent ventral hernia  05/19/2021   Benign prostatic hyperplasia without lower urinary tract symptoms 10/14/2020   Chronic pain 10/14/2020   ED (erectile dysfunction) of organic origin 10/14/2020   Esophageal dysphagia 10/14/2020   Gastroesophageal reflux disease 10/14/2020   History of aortic valve replacement with bioprosthetic valve 10/14/2020   Hx of aortic aneurysm repair 10/14/2020   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   Thoracic aortic aneurysm without rupture (HCC) 10/14/2020   Recurrent incisional hernia 10/14/2020   Spinal stenosis in cervical region 05/04/2020   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   Cervical radiculopathy at C6 11/14/2017   Carpal tunnel syndrome on right 07/08/2017   Tremor, essential 07/08/2017   Ischemic stroke of frontal lobe (HCC) 04/05/2017   Pain in right hand 04/05/2017   History of omental flap graft to  mediastinum 03/27/2017   Seizure (HCC) 03/14/2017   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/13/2017   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HCC) 03/12/2017   Essential hypertension 02/26/2017   Mixed hyperlipidemia 02/26/2017    PCP: Jason Leita Repine, FNP   REFERRING PROVIDER: Onetha Kuba, MD   REFERRING DIAG: [phone] (ICD-10-CM) - Spondylolisthesis, lumbar region   THERAPY DIAG:  Other low back pain  Muscle spasm of back  Muscle weakness (generalized)  Chronic midline low back pain with left-sided sciatica  Cramp and spasm  RATIONALE FOR EVALUATION AND TREATMENT: Rehabilitation  ONSET DATE: Acute on chronic x ~2 months  NEXT MD VISIT: ~05/04/2024   SUBJECTIVE:                                                                                                                                                                                                         SUBJECTIVE STATEMENT: Was in pain after last session but feels maybe he wasn't used to doing everything he did.   PAIN: Are you having pain? Yes: NPRS scale: 5/10   Pain location: Midline lumbar spine   Pain description: steady/constant aching or throbbing  Aggravating factors: prolonged sitting or standing, riding in the car  Relieving factors: changing positions, TENS during exercises, foam rolling to back   PERTINENT HISTORY:  History chronic back pain, multiple back surgeries, ACDF, B TKA, RA and OA, DVT, CVA frontal lobe with memory deficits, seizures, open heart surgery to repair aneurism, CABG x 2, omental flap graft to mediastinum, hernia repair.   PRECAUTIONS: None  RED FLAGS: None  WEIGHT BEARING RESTRICTIONS: No  FALLS:  Has patient fallen in last 6 months? No  LIVING ENVIRONMENT:  Lives with: lives with their spouse Lives in: House/apartment Stairs: No Has following equipment at home: Single point cane, Walker - 2 wheeled, and bed side commode  OCCUPATION: Retired  PLOF: Independent and Leisure: exercises frequently - walk treadmill    PATIENT GOALS: Take away the pain.   OBJECTIVE: (objective measures completed at initial evaluation unless otherwise dated)  DIAGNOSTIC FINDINGS:  01/26/24 - MR Lumbar Spine: Results still pending as of 02/03/2024  04/08/21 - MR Lumbar Spine:  IMPRESSION: No change is appreciated compared to the study of last year.   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 Pseudo disc herniation. Bilateral foraminal stenosis could possibly affect either or both exiting L5 nerves.   L3-4: Mild multifactorial stenosis secondary to bulging of the disc and mild facet and ligamentous prominence. No visible neural compression.   Other levels show disc bulges  and facet degeneration but no apparent neural compressive stenosis.  PATIENT SURVEYS:  Modified Oswestry 25 / 50 = 50.0 %, severe disability  03/19/24: 19 / 50 = 38.0 %, moderate disability  SCREENING FOR RED FLAGS: Bowel or bladder incontinence: No Spinal tumors: No Cauda equina syndrome: No Compression fracture: No Abdominal aneurysm: No  COGNITION:  Overall  cognitive status: Impaired - STM   SENSATION: WFL Numbness in hands from prior CVA  POSTURE:  decreased lumbar lordosis  PALPATION: TTP midline lumbar spine and over prior surgical incision. Increased muscle tension in B lumbar paraspinals but denies TTP.  LUMBAR ROM:   Active  Eval - p! w/ all motions 02/27/24 03/31/24  Flexion Hands to knees Hands to ankles Hands to ankles  Extension 60% limited 50% limited 25% limited  Right lateral flexion Hand to lateral knee Lateral knee Hand to mid calf - p! R midline  Left lateral flexion Hand to lateral knee Lateral knee Hand to mid calf  Right rotation 40% limited  25% limited - p! R midline  Left rotation 40% limited  15% limited  (Blank rows = not tested)  MUSCLE LENGTH: Hamstrings: mod tight R, mild tight L ITB: mod tight R, mild tight L Piriformis: mod/severe tight B Hip flexors: mod tight B Quads: mod tight B Heelcord:   LOWER EXTREMITY ROM:    Limited d/t h/o TKA and low back pain   LOWER EXTREMITY MMT:    MMT Right eval Left eval R 03/13/24 L 03/13/24 R 03/31/24 L 03/31/24  Hip flexion 4 4+ 4+ 4+ 4+ 4+  Hip extension 4- 4 4- 4- 4 4-  Hip abduction 4 4+ 4 4+ 4+ 4  Hip adduction 4+ 4+   4+ 4+  Hip internal rotation 4- p! Lateral knee 4+ 4 4+ 4 p! lateral knee 4+  Hip external rotation 4- p! Lateral knee 4 4+ 4+ 4 p! lateral knee 4+  Knee flexion 4+ 5 5 5 5 5   Knee extension 4+ 5 4 5 5 5   Ankle dorsiflexion 3+ 3+ 4- 4- 4 4  Ankle plantarflexion        Ankle inversion        Ankle eversion         (Blank rows = not tested)  LUMBAR SPECIAL TESTS:  Straight leg raise test: Positive   TODAY'S TREATMENT:  04/06/24 THERAPEUTIC EXERCISE: To improve strength, endurance, ROM, and flexibility Nustep L5x10 min Seated fwd roll outs w/ green pball x 30   THERAPEUTIC ACTIVITIES: To improve functional performance.  Demonstration, verbal and tactile cues throughout for technique.  Standing side bend with 10lb kettle bell x 15  B Diagonal bend and reach to elevated mat table 10lb each side 2x10 Chop with blue TB x 10 B NEUROMUSCULAR RE-EDUCATION: To improve kinesthesia, posture, and proprioception.  Seated on green pball:  4 way Weight Shifts x20 B  Trunk rotations x 10 B Marches x 15 B Seated LAQs x20 B  Standing lat pull down + march Blue TB 2x10 04/02/24 THERAPEUTIC EXERCISE: To improve strength, endurance, ROM, and flexibility Rec Bike L4x10 min Seated 3-way roll outs w/ green pball x10 ea way  Seated Chest Press 20# 2x15  THERAPEUTIC ACTIVITIES: To improve functional performance.  Demonstration, verbal and tactile cues throughout for technique.  Farmers Carry 2x5# kb, 2x10# kb (20')- cues to maintain posture  Suitcase carries 10# kb 4x20'- cues to be careful of leaning to weighted side  NEUROMUSCULAR RE-EDUCATION: To improve kinesthesia,  posture, and proprioception.  STS 2x10- 5# kb at chest with cues for upright posture  Seated on green pball in corner:  Seated Weight Shifts x20 B  Seated Marches x20  Seated Marches with opposite arm raise x10- pt had difficulty with rhythm  Seated LAQs x10 B  Standing on airex pad in corner: ball tosses + catches- 2x10 (chest pass)  Standing on airex pad in corner: ball tosses + catches- 2x10 (overhead pass and catch) Standing Scap Squeeze + Shoulder Row Blue TB 2x10  Standing Scap Squeeze + Shoulder Ext Blue TB x20    03/31/24 THERAPEUTIC EXERCISE: To improve strength, endurance, and ROM.  Demonstration, verbal and tactile cues throughout for technique. Rec Bike - L4 x 10 min Verbal HEP review Bridge x 10 Bridge + GTB hip ABD x 10 - pt reporting better awareness of glute activation Standing hip extension with slight fwd lean   THERAPEUTIC ACTIVITIES: To improve functional performance.  Demonstration, verbal and tactile cues throughout for technique. Lumbar ROM assessment LE MMT Goal assessment  SELF CARE: Provided education to increase independence with  ADLs and to facilitate performance of basic household cleaning/chores.  Provided education in proper posture and body mechanics for typical daily positioning and household chores to minimize strain on low back and neck. Information provided on home TENS unit option (unit used in clinic during exercises).   03/23/24 THERAPEUTIC EXERCISE: To improve strength, endurance, and flexibility. Nustep L6x22min UE/LE  TENS unit utilized to B thoracolumbar PS during session for pain modulation: NEUROMUSCULAR RE-EDUCATION: To improve balance, posture, and proprioception. Standing TrA +  shoulder row Blue TB x 20 Standing TrA +  shoulder extension  Blue TB x 20 Standing trunk rotation Blue TB x 10 B Standing pallof press Blue TB 2 x 10 B Clock balance from airex to floor 12 to 6 o'clock  Toe taps from airex to 8' step x 20 Ball toss and catch from airex Sit to stands x 15 Posture/body mechanics education   03/19/2024 THERAPEUTIC EXERCISE: To improve strength and endurance.   TM 2.5 mph x 10'  THERAPEUTIC ACTIVITIES: To improve functional performance.  Demonstration, verbal and tactile cues throughout for technique. Modified Oswestry: 19 / 50 = 38.0 %, moderate disability  NEUROMUSCULAR RE-EDUCATION: To improve balance, coordination, kinesthesia, posture, and proprioception. Standing TrA +  shoulder row GTB x 10, Blue TB x 10 Standing TrA +  shoulder extension GTB x 10, Blue TB x 10  MODALITIES: Auvon TENS unit with 1 channel on each thoracolumbar paraspinals, intensity to pt tolerance during therapeutic exercises  MANUAL THERAPY: To promote normalized muscle tension, improved flexibility, pain modulation, and reduced pain utilizing connective tissue massage and therapeutic massage.  IASTM with foam roller to R>L lumbar paraspinals   PATIENT EDUCATION:  Education details: ongoing PT POC and continue with current HEP  Person educated: Patient Education method: Explanation, Demonstration, and  Verbal cues Education comprehension: verbalized understanding, returned demonstration, verbal cues required, and needs further education  HOME EXERCISE PROGRAM: Access Code: [email or web address] URL: [email or web address] Date: 03/31/2024 Prepared by: Elijah Hidden  Exercises - Supine Hamstring Stretch with Strap  - 2 x daily - 7 x weekly - 3 reps - 30 sec hold - Supine Iliotibial Band Stretch with Strap  - 2 x daily - 7 x weekly - 3 reps - 30 sec hold - Standing Shoulder Row with Anchored Resistance  - 1 x daily - 7 x weekly - 3 sets - 10 reps - Shoulder extension with  resistance - Neutral  - 1 x daily - 7 x weekly - 3 sets - 10 reps - Standing Anti-Rotation Press with Anchored Resistance  - 1 x daily - 7 x weekly - 3 sets - 10 reps - Hooklying Clamshell with Resistance  - 1 x daily - 7 x weekly - 2 sets - 10 reps - 3 sec hold - Bridge with Hip Abduction and Resistance - Ground Touches  - 1 x daily - 3-4 x weekly - 2 sets - 10 reps - 3 sec hold - Prone Hip Extension on Table  - 1 x daily - 3-4 x weekly - 2 sets - 10 reps - 3 sec hold  Patient Education - Posture and Body Mechanics   ASSESSMENT:  CLINICAL IMPRESSION: Able to progress interventions as tolerated today w/o increased pain. Advanced more rotational trunk strengthening and functional movement patterns. Minor cues required today to correct form. Pt will continue to benefit from skilled PT intervention to address deficits for improvement in mobility and activity tolerance w/ dec'd pain interference.   Eval: Erik Salazar is a 73 y.o. male who was referred to physical therapy for evaluation and treatment for acute on chronic LBP secondary to lumbar spondylolisthesis.  Patient reports onset of current midline low back pain beginning ~2 months ago without known MOI.  He is unable to isolate aggravating or relieving factors.  Patient has deficits in lumbar ROM, proximal LE flexibility, B LE strength, abnormal posture, and TTP with  abnormal muscle tension  which are interfering with ADLs and are impacting quality of life.  On Modified Oswestry patient scored 25/50 demonstrating 50% or severe disability.  Osric will benefit from skilled PT to address above deficits to improve mobility and activity tolerance with decreased pain interference.  Attempt at initial HEP creation limited by increased pain.  PT referral included orders for TPDN - patient made aware of recent change in rehab policy requiring OOP cash pay for TPDN at time of service in order to remain in regulatory and legal compliance with billing.  OBJECTIVE IMPAIRMENTS: decreased activity tolerance, decreased endurance, decreased knowledge of condition, decreased mobility, difficulty walking, decreased ROM, decreased strength, increased fascial restrictions, impaired perceived functional ability, increased muscle spasms, impaired flexibility, improper body mechanics, postural dysfunction, and pain.  ACTIVITY LIMITATIONS: carrying, lifting, bending, sitting, standing, squatting, sleeping, stairs, transfers, bed mobility, locomotion level, and caring for others   PARTICIPATION LIMITATIONS: meal prep, cleaning, laundry, driving, shopping, community activity, and yard work  PERSONAL FACTORS: Age, Fitness, Past/current experiences, Time since onset of injury/illness/exacerbation, and 3+ comorbidities: History chronic back pain, multiple back surgeries, ACDF, B TKA, RA and OA, DVT, CVA frontal lobe with memory deficits, seizures, open heart surgery to repair aneurism, CABG x 2, omental flap graft to mediastinum, hernia repair.  are also affecting patient's functional outcome.   REHAB POTENTIAL: Good  CLINICAL DECISION MAKING: Evolving/moderate complexity  EVALUATION COMPLEXITY: Moderate   GOALS: Goals reviewed with patient? Yes  SHORT TERM GOALS: Target date: 03/02/2024  Patient will be independent with initial HEP to improve outcomes and carryover.  Baseline:  Partial initial HEP provided on eval but limited by pain Goal status: MET - 02/24/24   2.  Patient will report 25% improvement in low back pain to improve QOL. Baseline: 7/10 on eval, up to 10/10 at worst 02/24/24 - no significant change since eval 03/02/24 - pain increased since ESIs Goal status: MET - 03/06/24 - pt reports 25% improvement in LBP    LONG  TERM GOALS: Target date: 03/30/2024, extended to 05/12/2024  Patient will be independent with ongoing/advanced HEP for self-management at home.  Baseline:  03/17/24 - met for current HEP, anticipate further updates Goal status: IN PROGRESS - 03/31/24 - HEP reviewed and updated  2.  Patient will report 50-75% improvement in low back pain to improve QOL.  Baseline: 7/10 on eval, up to 10/10 at worst 03/17/24 - 10-15% improvement currently  Goal status: IN PROGRESS - 03/31/24 - 20-25% improvement  3.  Patient to demonstrate ability to achieve and maintain good spinal alignment/posturing and body mechanics needed for daily activities. Baseline:  Goal status: IN PROGRESS - 03/31/24 - reviewed posture and body mechanics for typical daily activities with handout provided  4.  Patient will demonstrate functional pain free lumbar ROM to perform ADLs.   Baseline: Refer to above lumbar ROM table Goal status: IN PROGRESS - 03/31/24 - Improving ROM with pain now only associated with extension > flexion and R side-bending and rotation  5.  Patient will demonstrate improved B LE strength to >/= 4+/5 for improved stability and ease of mobility. Baseline: Refer to above LE MMT table Goal status: PARTIALLY MET - 03/31/24 - continued weakness in B hip extension and some lateral motions  6. Patient will report </= 38% on Modified Oswestry (MCID = 12%) to demonstrate improved functional ability with decreased pain interference. Baseline: 25 / 50 = 50.0 % Goal status: MET - 03/19/24 - 19 / 50 = 38.0 %  7.  Patient will tolerate 20-30 min of sitting or standing w/o  increased pain to allow for  improved mobility and activity tolerance. Baseline: Per modified Oswestry pain limits sitting >1/2-hour and standing >10 minutes Goal status: IN PROGRESS - 03/31/24 - Pt reports he is able to sit for 20-30 minutes before needing to change position due to pain   PLAN:  PT FREQUENCY: 2x/week  PT DURATION: 6-8 weeks  PLANNED INTERVENTIONS: 02835- PT Re-evaluation, 97750- Physical Performance Testing, 97110-Therapeutic exercises, 97530- Therapeutic activity, [id number]- Neuromuscular re-education, 97535- Self Care, [id number]- Manual therapy, [id number]- Gait training, [phone]- Aquatic Therapy, [phone]- Electrical stimulation (unattended), 97035- Ultrasound, 79439 (1-2 muscles), [phone] (3+ muscles)- Dry Needling, Patient/Family education, Balance training, Taping, Joint mobilization, Spinal mobilization, Cryotherapy, and Moist heat  PLAN FOR NEXT SESSION:  Incorporate more lumbar rotational and side bend motion gently; Progress core/lumbopelvic (emphasis on hip extension) and ankle strengthening; Continue machine weight training with core stabilization;  Ball stabilization exercises.  E-stim/TENS as benefit noted - provide education in self-setup for home unit PRN if pt purchases TENS unit.       Pantelis Elgersma L Aeralyn Barna, PTA 04/06/2024, 9:39 AM

## 2024-04-09 ENCOUNTER — Ambulatory Visit: Admitting: Physical Therapy

## 2024-04-09 ENCOUNTER — Encounter: Payer: Self-pay | Admitting: Physical Therapy

## 2024-04-09 DIAGNOSIS — M5459 Other low back pain: Secondary | ICD-10-CM

## 2024-04-09 DIAGNOSIS — M6281 Muscle weakness (generalized): Secondary | ICD-10-CM

## 2024-04-09 DIAGNOSIS — M62830 Muscle spasm of back: Secondary | ICD-10-CM

## 2024-04-09 NOTE — Therapy (Signed)
 OUTPATIENT PHYSICAL THERAPY TREATMENT      Patient Name: Erik Salazar MRN: [id number] DOB:[birth year], 73 y.o., male Today's Date: 04/09/2024  END OF SESSION:  PT End of Session - 04/09/24 0845     Visit Number 15    Date for PT Re-Evaluation 05/12/24    Authorization Type [employer]    Progress Note Due on Visit 22    PT Start Time 0845    PT Stop Time 0931    PT Time Calculation (min) 46 min    Activity Tolerance Patient tolerated treatment well    Behavior During Therapy [facility] for tasks assessed/performed                Past Medical History:  Diagnosis Date   Allergy see attached info   Arthritis    RA and OA   Blood transfusion without reported diagnosis    with 2nd heart surgery   Carpal tunnel syndrome on right 07/08/2017   Cervical radiculopathy at C6 11/14/2017   Right   Complication of anesthesia    Coronary artery disease    Enlarged prostate    Fibromyalgia    H/O: knee surgery    15    Hayfever    WHEN YOUNG   History of blood clots    History of kidney stones    History of open heart surgery    ASCENDING AORTIC ANEURYSM   Ischemic stroke of frontal lobe (HCC) 03/14/2017   Bilateral; post-redo CT surgery   Pneumonia    PONV (postoperative nausea and vomiting)    only after CABG surgeries   Seizure disorder (HCC) 03/14/2017   Seizures (HCC)    last one 02/2021,updated 09/25/22   Status post knee surgery    DVT POST KNEE SURGERY   Stroke [facility])    Past Surgical History:  Procedure Laterality Date   ANTERIOR CERVICAL DECOMP/DISCECTOMY FUSION N/A 05/04/2020   Procedure: Anterior Cervical Decompression/Discectomy Fuion Cervical three-four, Cervical four-five, Cervical five-six;  Surgeon: Onetha Kuba, MD;  Location: [facility] OR;  Service: Neurosurgery;  Laterality: N/A;   BACK SURGERY     lower back 06/28/2021   BALLOON DILATION N/A 06/23/2019   Procedure: BALLOON DILATION;  Surgeon: Elicia Claw, MD;  Location: [facility] ENDOSCOPY;  Service:  Gastroenterology;  Laterality: N/A;   BENTALL PROCEDURE  01/04/2016   Bentall with 23 mm pericardial AVR; SVG-LAD, SVG-CX [facility], [place])   BICEPS TENDON REPAIR Right    BIOPSY  06/23/2019   Procedure: BIOPSY;  Surgeon: Elicia Claw, MD;  Location: [facility] ENDOSCOPY;  Service: Gastroenterology;;   CARDIAC SURGERY     ANUERSYM MAY 2017   Bentall procedure. Bioprosthetic aortic valve #23 mm bovine model #2700 TF ask, and 28 mm Gelweave woven vascular sinus of Valsalva graft   CARDIAC VALVE REPLACEMENT  02/2016, 02/2017   CARPAL TUNNEL RELEASE  08/2018   right hand    COLONOSCOPY WITH PROPOFOL  N/A 10/22/2022   Procedure: COLONOSCOPY WITH PROPOFOL ;  Surgeon: San Sandor GAILS, DO;  Location: [facility];  Service: Gastroenterology;  Laterality: N/A;   CORONARY ARTERY BYPASS GRAFT  01/04/2016   VG to LAD & VG to LCX   CORONARY ARTERY BYPASS GRAFT  03/13/2017   LIMA to LAD with steril abcess removal from dacron graft   CYSTOSCOPY/URETEROSCOPY/HOLMIUM LASER/STENT PLACEMENT Left 06/12/2022   Procedure: CYSTOSCOPY/URETEROSCOPY/HOLMIUM LASER/STENT PLACEMENT;  Surgeon: Carolee Sherwood JONETTA DOUGLAS, MD;  Location: [facility] ORS;  Service: Urology;  Laterality: Left;   ESOPHAGOGASTRODUODENOSCOPY     Had done  dilatation done about 2 or 3 times before in [place]   ESOPHAGOGASTRODUODENOSCOPY (EGD) WITH PROPOFOL  N/A 06/23/2019   Procedure: ESOPHAGOGASTRODUODENOSCOPY (EGD) WITH PROPOFOL ;  Surgeon: Elicia Claw, MD;  Location: [facility];  Service: Gastroenterology;  Laterality: N/A;   FALSE ANEURYSM REPAIR  03/13/2017   redo sternotomy, sterile abscess removal from Dacron graft, omental flap around aorta, CABG: LIMA-LAD ([name], Dr. Zachary Remington)   GREATER OMENTAL FLAP CLOSURE  2018   Of pericardium 2018   INSERTION OF MESH  10/14/2020   Procedure: INSERTION OF MESH;  Surgeon: Sheldon Standing, MD;  Location: [facility] OR;  Service: General;;   JOINT REPLACEMENT  knee   knee surgeries     13 surgeries on knee  done before knee replacement    KNEE SURGERY  1983   LAPAROSCOPIC LYSIS OF ADHESIONS N/A 05/19/2021   Procedure: LYSIS OF ADHESIONS;  Surgeon: Sheldon Standing, MD;  Location: [facility] OR;  Service: General;  Laterality: N/A;  GEN & LOCAL   LYSIS OF ADHESION N/A 10/14/2020   Procedure: LYSIS OF ADHESION;  Surgeon: Sheldon Standing, MD;  Location: [facility] OR;  Service: General;  Laterality: N/A;   open heart surgery     03-13-2017   REPLACEMENT TOTAL KNEE Left 2015   ROTATOR CUFF REPAIR Right    done with biceps tendon repair   SPINE SURGERY  upper neck, lower spine   TONSILLECTOMY     removed as a child.   TOTAL KNEE ARTHROPLASTY Right 01/18/2022   Procedure: RIGHT TOTAL KNEE ARTHROPLASTY;  Surgeon: Jerri Kay HERO, MD;  Location: [facility] OR;  Service: Orthopedics;  Laterality: Right;   VENTRAL HERNIA REPAIR N/A 10/14/2020   Procedure: LAPAROSCOPIC VENTRAL HERNIA REPAIR WITH TAP BLOCK BILATERAL;  Surgeon: Sheldon Standing, MD;  Location: [facility] OR;  Service: General;  Laterality: N/A;   VENTRAL HERNIA REPAIR N/A 05/19/2021   Procedure: LAPAROSCOPIC VENTRAL WALL HERNIA REPAIR;  Surgeon: Sheldon Standing, MD;  Location: [facility] OR;  Service: General;  Laterality: N/A;   Patient Active Problem List   Diagnosis Date Noted   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   Constipation 10/22/2022   Hypertrophied anal papilla 10/22/2022   Colon cancer screening 10/22/2022   Postop check 03/26/2022   Gait abnormality 01/29/2022   Status post total right knee replacement 01/18/2022   Coronary artery disease involving native coronary artery of native heart without angina pectoris 01/04/2022   Preop cardiovascular exam 01/04/2022   Primary osteoarthritis of right knee 01/04/2022   Acute right-sided low back pain without sciatica 12/25/2021   Aortic atherosclerosis (HCC) 09/04/2021   Spondylolisthesis at L4-L5 level 06/28/2021   S/P repair of ventral hernia 05/19/2021   S/P repair of recurrent ventral hernia  05/19/2021   Benign prostatic hyperplasia without lower urinary tract symptoms 10/14/2020   Chronic pain 10/14/2020   ED (erectile dysfunction) of organic origin 10/14/2020   Esophageal dysphagia 10/14/2020   Gastroesophageal reflux disease 10/14/2020   History of aortic valve replacement with bioprosthetic valve 10/14/2020   Hx of aortic aneurysm repair 10/14/2020   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   Thoracic aortic aneurysm without rupture (HCC) 10/14/2020   Recurrent incisional hernia 10/14/2020   Spinal stenosis in cervical region 05/04/2020   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   Cervical radiculopathy at C6 11/14/2017   Carpal tunnel syndrome on right 07/08/2017   Tremor, essential 07/08/2017   Ischemic stroke of frontal lobe (HCC) 04/05/2017   Pain in right hand 04/05/2017   History of omental flap graft  to mediastinum 03/27/2017   Seizure (HCC) 03/14/2017   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/13/2017   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HCC) 03/12/2017   Essential hypertension 02/26/2017   Mixed hyperlipidemia 02/26/2017    PCP: Jason Leita Repine, FNP   REFERRING PROVIDER: Onetha Kuba, MD   REFERRING DIAG: M43.16 (ICD-10-CM) - Spondylolisthesis, lumbar region   THERAPY DIAG:  Other low back pain  Muscle spasm of back  Muscle weakness (generalized)  RATIONALE FOR EVALUATION AND TREATMENT: Rehabilitation  ONSET DATE: Acute on chronic x ~2 months  NEXT MD VISIT: ~05/04/2024   SUBJECTIVE:                                                                                                                                                                                                         SUBJECTIVE STATEMENT: Pt reports his pain was pretty bad yesterday.  He is hoping the shots he is scheduled for on Monday will help.   PAIN: Are you having pain? Yes: NPRS scale: 5/10   Pain location: Midline lumbar spine  Pain description: steady/constant aching or throbbing   Aggravating factors: prolonged sitting or standing, riding in the car  Relieving factors: changing positions, TENS during exercises, foam rolling to back   PERTINENT HISTORY:  History chronic back pain, multiple back surgeries, ACDF, B TKA, RA and OA, DVT, CVA frontal lobe with memory deficits, seizures, open heart surgery to repair aneurism, CABG x 2, omental flap graft to mediastinum, hernia repair.   PRECAUTIONS: None  RED FLAGS: None  WEIGHT BEARING RESTRICTIONS: No  FALLS:  Has patient fallen in last 6 months? No  LIVING ENVIRONMENT:  Lives with: lives with their spouse Lives in: House/apartment Stairs: No Has following equipment at home: Single point cane, Walker - 2 wheeled, and bed side commode  OCCUPATION: Retired  PLOF: Independent and Leisure: exercises frequently - walk treadmill    PATIENT GOALS: Take away the pain.   OBJECTIVE: (objective measures completed at initial evaluation unless otherwise dated)  DIAGNOSTIC FINDINGS:  01/26/24 - MR Lumbar Spine: Results still pending as of 02/03/2024  04/08/21 - MR Lumbar Spine:  IMPRESSION: No change is appreciated compared to the study of last year.   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 Pseudo disc herniation. Bilateral foraminal stenosis could possibly affect either or both exiting L5 nerves.   L3-4: Mild multifactorial stenosis secondary to bulging of the disc and mild facet and ligamentous prominence. No visible neural compression.   Other levels show disc bulges and facet degeneration but no apparent neural  compressive stenosis.  PATIENT SURVEYS:  Modified Oswestry 25 / 50 = 50.0 %, severe disability  03/19/24: 19 / 50 = 38.0 %, moderate disability  SCREENING FOR RED FLAGS: Bowel or bladder incontinence: No Spinal tumors: No Cauda equina syndrome: No Compression fracture: No Abdominal aneurysm: No  COGNITION:  Overall cognitive status: Impaired -  STM   SENSATION: WFL Numbness in hands from prior CVA  POSTURE:  decreased lumbar lordosis  PALPATION: TTP midline lumbar spine and over prior surgical incision. Increased muscle tension in B lumbar paraspinals but denies TTP.  LUMBAR ROM:   Active  Eval - p! w/ all motions 02/27/24 03/31/24  Flexion Hands to knees Hands to ankles Hands to ankles  Extension 60% limited 50% limited 25% limited  Right lateral flexion Hand to lateral knee Lateral knee Hand to mid calf - p! R midline  Left lateral flexion Hand to lateral knee Lateral knee Hand to mid calf  Right rotation 40% limited  25% limited - p! R midline  Left rotation 40% limited  15% limited  (Blank rows = not tested)  MUSCLE LENGTH: Hamstrings: mod tight R, mild tight L ITB: mod tight R, mild tight L Piriformis: mod/severe tight B Hip flexors: mod tight B Quads: mod tight B Heelcord:   LOWER EXTREMITY ROM:    Limited d/t h/o TKA and low back pain   LOWER EXTREMITY MMT:    MMT Right eval Left eval R 03/13/24 L 03/13/24 R 03/31/24 L 03/31/24  Hip flexion 4 4+ 4+ 4+ 4+ 4+  Hip extension 4- 4 4- 4- 4 4-  Hip abduction 4 4+ 4 4+ 4+ 4  Hip adduction 4+ 4+   4+ 4+  Hip internal rotation 4- p! Lateral knee 4+ 4 4+ 4 p! lateral knee 4+  Hip external rotation 4- p! Lateral knee 4 4+ 4+ 4 p! lateral knee 4+  Knee flexion 4+ 5 5 5 5 5   Knee extension 4+ 5 4 5 5 5   Ankle dorsiflexion 3+ 3+ 4- 4- 4 4  Ankle plantarflexion        Ankle inversion        Ankle eversion         (Blank rows = not tested)  LUMBAR SPECIAL TESTS:  Straight leg raise test: Positive   TODAY'S TREATMENT:   04/09/24 THERAPEUTIC EXERCISE: To improve strength, endurance, and flexibility.  Demonstration, verbal and tactile cues throughout for technique.  Rec Bike - L4 x 10 min Seated 3-way roll outs w/ green pball x 30 forward and x 10 to each side   NEUROMUSCULAR RE-EDUCATION: To improve balance, coordination, kinesthesia, posture, and  proprioception. Standing GTB 4-way SLR x 10 bil - mostly w/o UE support but intermittent light UE support on back of chair for hip extension and abduction Wall pushups focusing on core stability (avoiding hip hinge or sway back) 2 x 10  THERAPEUTIC ACTIVITIES: To improve functional performance.  Demonstration, verbal and tactile cues throughout for technique. Floor to hip height 10# kettle bell lift focusing on hip and knee flexion (squatting) while avoiding trunk flexion/extension x 10 Floor to chest height 10# kettle bell lift focusing on hip and knee flexion (squatting) while avoiding trunk flexion/extension - deferred d/t increased LBP  MANUAL THERAPY: To promote normalized muscle tension and pain modulation utilizing connective tissue massage and therapeutic massage.  IASTM with foam roller to B lumbar paraspinals   04/06/24 THERAPEUTIC EXERCISE: To improve strength, endurance, ROM, and flexibility Nustep L5x10 min  Seated fwd roll outs w/ green pball x 30   THERAPEUTIC ACTIVITIES: To improve functional performance.  Demonstration, verbal and tactile cues throughout for technique.  Standing side bend with 10lb kettle bell x 15 B Diagonal bend and reach to elevated mat table 10lb each side 2x10 Chop with blue TB x 10 B NEUROMUSCULAR RE-EDUCATION: To improve kinesthesia, posture, and proprioception.  Seated on green pball:  4 way Weight Shifts x20 B  Trunk rotations x 10 B Marches x 15 B Seated LAQs x20 B  Standing lat pull down + march Blue TB 2x10   04/02/24 THERAPEUTIC EXERCISE: To improve strength, endurance, ROM, and flexibility Rec Bike L4x10 min Seated 3-way roll outs w/ green pball x10 ea way  Seated Chest Press 20# 2x15   THERAPEUTIC ACTIVITIES: To improve functional performance.  Demonstration, verbal and tactile cues throughout for technique.  Farmers Carry 2x5# kb, 2x10# kb (20')- cues to maintain posture  Suitcase carries 10# kb 4x20'- cues to be careful of leaning  to weighted side   NEUROMUSCULAR RE-EDUCATION: To improve kinesthesia, posture, and proprioception.  STS 2x10- 5# kb at chest with cues for upright posture  Seated on green pball in corner:  Seated Weight Shifts x20 B  Seated Marches x20  Seated Marches with opposite arm raise x10- pt had difficulty with rhythm  Seated LAQs x10 B  Standing on airex pad in corner: ball tosses + catches- 2x10 (chest pass)  Standing on airex pad in corner: ball tosses + catches- 2x10 (overhead pass and catch) Standing Scap Squeeze + Shoulder Row Blue TB 2x10  Standing Scap Squeeze + Shoulder Ext Blue TB x20    03/31/24 THERAPEUTIC EXERCISE: To improve strength, endurance, and ROM.  Demonstration, verbal and tactile cues throughout for technique. Rec Bike - L4 x 10 min Verbal HEP review Bridge x 10 Bridge + GTB hip ABD x 10 - pt reporting better awareness of glute activation Standing hip extension with slight fwd lean   THERAPEUTIC ACTIVITIES: To improve functional performance.  Demonstration, verbal and tactile cues throughout for technique. Lumbar ROM assessment LE MMT Goal assessment  SELF CARE: Provided education to increase independence with ADLs and to facilitate performance of basic household cleaning/chores.  Provided education in proper posture and body mechanics for typical daily positioning and household chores to minimize strain on low back and neck. Information provided on home TENS unit option (unit used in clinic during exercises).   03/23/24 THERAPEUTIC EXERCISE: To improve strength, endurance, and flexibility. Nustep L6x16min UE/LE  TENS unit utilized to B thoracolumbar PS during session for pain modulation: NEUROMUSCULAR RE-EDUCATION: To improve balance, posture, and proprioception. Standing TrA +  shoulder row Blue TB x 20 Standing TrA +  shoulder extension  Blue TB x 20 Standing trunk rotation Blue TB x 10 B Standing pallof press Blue TB 2 x 10 B Clock balance from airex to  floor 12 to 6 o'clock  Toe taps from airex to 8' step x 20 Ball toss and catch from airex Sit to stands x 15 Posture/body mechanics education   03/19/2024 THERAPEUTIC EXERCISE: To improve strength and endurance.   TM 2.5 mph x 10'  THERAPEUTIC ACTIVITIES: To improve functional performance.  Demonstration, verbal and tactile cues throughout for technique. Modified Oswestry: 19 / 50 = 38.0 %, moderate disability  NEUROMUSCULAR RE-EDUCATION: To improve balance, coordination, kinesthesia, posture, and proprioception. Standing TrA +  shoulder row GTB x 10, Blue TB x 10 Standing TrA +  shoulder extension GTB  x 10, Blue TB x 10  MODALITIES: Auvon TENS unit with 1 channel on each thoracolumbar paraspinals, intensity to pt tolerance during therapeutic exercises  MANUAL THERAPY: To promote normalized muscle tension, improved flexibility, pain modulation, and reduced pain utilizing connective tissue massage and therapeutic massage.  IASTM with foam roller to R>L lumbar paraspinals   PATIENT EDUCATION:  Education details: continue with current HEP and posture and body mechanics for typical daily postioning, mobility and household tasks  Person educated: Patient Education method: Explanation, Demonstration, and Verbal cues Education comprehension: verbalized understanding, returned demonstration, verbal cues required, and needs further education  HOME EXERCISE PROGRAM: Access Code: [email or web address] URL: [email or web address] Date: 03/31/2024 Prepared by: Elijah Hidden  Exercises - Supine Hamstring Stretch with Strap  - 2 x daily - 7 x weekly - 3 reps - 30 sec hold - Supine Iliotibial Band Stretch with Strap  - 2 x daily - 7 x weekly - 3 reps - 30 sec hold - Standing Shoulder Row with Anchored Resistance  - 1 x daily - 7 x weekly - 3 sets - 10 reps - Shoulder extension with resistance - Neutral  - 1 x daily - 7 x weekly - 3 sets - 10 reps - Standing Anti-Rotation Press with Anchored  Resistance  - 1 x daily - 7 x weekly - 3 sets - 10 reps - Hooklying Clamshell with Resistance  - 1 x daily - 7 x weekly - 2 sets - 10 reps - 3 sec hold - Bridge with Hip Abduction and Resistance - Ground Touches  - 1 x daily - 3-4 x weekly - 2 sets - 10 reps - 3 sec hold - Prone Hip Extension on Table  - 1 x daily - 3-4 x weekly - 2 sets - 10 reps - 3 sec hold  Patient Education - Posture and Body Mechanics   ASSESSMENT:  CLINICAL IMPRESSION: Continued to focus on proximal LE/hip strengthening while incorporating SLS balance for improved postural and core stability, with patient requiring less UE support for balance.  Theophil denied need for review of posture and body mechanics, but cue necessary during functional lifting exercises to lift with legs while stabilizing back vs using trunk flexion/extension.  He is scheduled for another round of injections next week and is hopeful that this will give him some more consistent pain relief.  Jerrelle will benefit from skilled PT to address ongoing ROM, flexibility and strength deficits to improve mobility and activity tolerance with decreased pain interference.   Eval: Aureliano Evinger is a 73 y.o. male who was referred to physical therapy for evaluation and treatment for acute on chronic LBP secondary to lumbar spondylolisthesis.  Patient reports onset of current midline low back pain beginning ~2 months ago without known MOI.  He is unable to isolate aggravating or relieving factors.  Patient has deficits in lumbar ROM, proximal LE flexibility, B LE strength, abnormal posture, and TTP with abnormal muscle tension  which are interfering with ADLs and are impacting quality of life.  On Modified Oswestry patient scored 25/50 demonstrating 50% or severe disability.  Mikias will benefit from skilled PT to address above deficits to improve mobility and activity tolerance with decreased pain interference.  Attempt at initial HEP creation limited by increased pain.  PT  referral included orders for TPDN - patient made aware of recent change in rehab policy requiring OOP cash pay for TPDN at time of service in order to remain in regulatory and legal compliance with billing.  OBJECTIVE IMPAIRMENTS: decreased activity tolerance, decreased endurance, decreased knowledge of condition, decreased mobility, difficulty walking, decreased ROM, decreased strength, increased fascial restrictions, impaired perceived functional ability, increased muscle spasms, impaired flexibility, improper body mechanics, postural dysfunction, and pain.  ACTIVITY LIMITATIONS: carrying, lifting, bending, sitting, standing, squatting, sleeping, stairs, transfers, bed mobility, locomotion level, and caring for others   PARTICIPATION LIMITATIONS: meal prep, cleaning, laundry, driving, shopping, community activity, and yard work  PERSONAL FACTORS: Age, Fitness, Past/current experiences, Time since onset of injury/illness/exacerbation, and 3+ comorbidities: History chronic back pain, multiple back surgeries, ACDF, B TKA, RA and OA, DVT, CVA frontal lobe with memory deficits, seizures, open heart surgery to repair aneurism, CABG x 2, omental flap graft to mediastinum, hernia repair.  are also affecting patient's functional outcome.   REHAB POTENTIAL: Good  CLINICAL DECISION MAKING: Evolving/moderate complexity  EVALUATION COMPLEXITY: Moderate   GOALS: Goals reviewed with patient? Yes  SHORT TERM GOALS: Target date: 03/02/2024  Patient will be independent with initial HEP to improve outcomes and carryover.  Baseline: Partial initial HEP provided on eval but limited by pain Goal status: MET - 02/24/24   2.  Patient will report 25% improvement in low back pain to improve QOL. Baseline: 7/10 on eval, up to 10/10 at worst 02/24/24 - no significant change since eval 03/02/24 - pain increased since ESIs Goal status: MET - 03/06/24 - pt reports 25% improvement in LBP    LONG TERM GOALS: Target  date: 03/30/2024, extended to 05/12/2024  Patient will be independent with ongoing/advanced HEP for self-management at home.  Baseline:  03/17/24 - met for current HEP, anticipate further updates Goal status: IN PROGRESS - 03/31/24 - HEP reviewed and updated  2.  Patient will report 50-75% improvement in low back pain to improve QOL.  Baseline: 7/10 on eval, up to 10/10 at worst 03/17/24 - 10-15% improvement currently  Goal status: IN PROGRESS - 03/31/24 - 20-25% improvement  3.  Patient to demonstrate ability to achieve and maintain good spinal alignment/posturing and body mechanics needed for daily activities. Baseline:  03/31/24 - reviewed posture and body mechanics for typical daily activities with handout provided Goal status: PARTIALLY MET - 04/09/24 - Pt denied any questions but cues still needed for proper lifting mechanics during visit  4.  Patient will demonstrate functional pain free lumbar ROM to perform ADLs.   Baseline: Refer to above lumbar ROM table Goal status: IN PROGRESS - 03/31/24 - Improving ROM with pain now only associated with extension > flexion and R side-bending and rotation  5.  Patient will demonstrate improved B LE strength to >/= 4+/5 for improved stability and ease of mobility. Baseline: Refer to above LE MMT table Goal status: PARTIALLY MET - 03/31/24 - continued weakness in B hip extension and some lateral motions  6. Patient will report </= 38% on Modified Oswestry (MCID = 12%) to demonstrate improved functional ability with decreased pain interference. Baseline: 25 / 50 = 50.0 % Goal status: MET - 03/19/24 - 19 / 50 = 38.0 %  7.  Patient will tolerate 20-30 min of sitting or standing w/o increased pain to allow for  improved mobility and activity tolerance. Baseline: Per modified Oswestry pain limits sitting >1/2-hour and standing >10 minutes Goal status: IN PROGRESS - 03/31/24 - Pt reports he is able to sit for 20-30 minutes before needing to change position  due to pain   PLAN:  PT FREQUENCY: 2x/week  PT DURATION: 6-8 weeks  PLANNED INTERVENTIONS: 02835- PT Re-evaluation, 97750-  Physical Performance Testing, 97110-Therapeutic exercises, 97530- Therapeutic activity, [id number]- Neuromuscular re-education, [phone]- Self Care, 02859- Manual therapy, [phone]- Gait training, [phone]- Aquatic Therapy, [phone]- Electrical stimulation (unattended), [phone]- Ultrasound, [phone] (1-2 muscles), [id number] (3+ muscles)- Dry Needling, Patient/Family education, Balance training, Taping, Joint mobilization, Spinal mobilization, Cryotherapy, and Moist heat  PLAN FOR NEXT SESSION:  Incorporate more lumbar rotational and side bend motion gently; Progress core/lumbopelvic (emphasis on hip extension) and ankle strengthening; Continue machine weight training with core stabilization;  Ball stabilization exercises.  E-stim/TENS as benefit noted - provide education in self-setup for home unit PRN if pt purchases TENS unit.       Elijah CHRISTELLA Hidden, PT 04/09/2024, 10:48 AM

## 2024-04-10 ENCOUNTER — Encounter: Payer: Self-pay | Admitting: Podiatry

## 2024-04-10 ENCOUNTER — Ambulatory Visit (INDEPENDENT_AMBULATORY_CARE_PROVIDER_SITE_OTHER): Admitting: Podiatry

## 2024-04-10 DIAGNOSIS — B070 Plantar wart: Secondary | ICD-10-CM

## 2024-04-10 NOTE — Progress Notes (Signed)
 Patient presents with complaint of still having some pain in the left plantar lateral aspect of the foot is not as bad as before but there is still tenderness there.   Physical exam:  General appearance: Pleasant, and in no acute distress. AOx3.  Vascular: Pedal pulses: DP 2/4 bilaterally, PT 2/4 bilaterally.  Normal edema lower legs bilaterally. Capillary fill time immediate lateral.  Neurological: Grossly intact bilaterally   Dermatologic:   3 mm diam  Verrucous appearing lesion plantar lateral right foot.  Skin lines go partially around the lesion.  There are some small pinpoint hemorrhages present. Skin normal temperature bilaterally.  Skin normal color, tone, and texture bilaterally.   Musculoskeletal: Some tenderness at the base of fifth metatarsal right.  Not as tender as before    Diagnosis: 1.  Plantar verruca right foot  Plan: -Discussed with him the tenderness.  Most of the tenderness today seems to be coming at the lesion itself.  Discussed plantar verruca versus benign neoplasm.  Probably -Applied Salinocaine compound to lesion(s) as noted in physical exam after debriding lesions to pinpoint bleeding.  Salinocaine applied to lesion(s) and covered with an occlusive dressing with Coban wrap.  Written and oral instructions given to patient.    Return 2 weeks follow-up lesion right foot.

## 2024-04-15 ENCOUNTER — Ambulatory Visit: Admitting: Surgery

## 2024-04-15 ENCOUNTER — Ambulatory Visit

## 2024-04-15 VITALS — BP 137/73 | HR 78 | Resp 20 | Ht 70.0 in | Wt 183.0 lb

## 2024-04-15 DIAGNOSIS — I712 Thoracic aortic aneurysm, without rupture, unspecified: Secondary | ICD-10-CM

## 2024-04-15 DIAGNOSIS — I719 Aortic aneurysm of unspecified site, without rupture: Secondary | ICD-10-CM | POA: Diagnosis not present

## 2024-04-15 NOTE — Progress Notes (Signed)
 [address] [place] [address]             [phone]            Erik Salazar [id number] July 04, 1951   History of Present Illness:  Mr. Erik Salazar is a 73 year old man with medical history of hypertension, ischemic stroke, seizures, essential tremor, carpal tunnel, GERD, and hyperlipidemia who presents for continued follow-up post Bentall procedure in 2017 and subsequent reexploration at [facility] in 2018 for possible pseudoaneurysm and replacement of one of the 2 vein grafts with a mammary to the LAD.  He continues to be followed with chest CTA annually.  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   He presents today to the clinic with his wife.  He states that he has been having more seizures recently and has been working with his neurologist on medication changes.  He does shortness of breath, chest pain and lower leg swelling.    Current Outpatient Medications on File Prior to Visit  Medication Sig Dispense Refill   acetaminophen  (TYLENOL ) 325 MG tablet Take 650 mg by mouth every 6 (six) hours as needed for moderate pain.     amoxicillin  (AMOXIL ) 500 MG capsule Take 4 capsules (2,000 mg total) by mouth 1 hour prior to dental appointment. 16 capsule 0   ascorbic acid  (VITAMIN C) 500 MG tablet Take 500 mg by mouth daily.     aspirin  EC 81 MG tablet Take 81 mg by mouth daily. Swallow whole.     inclisiran (LEQVIO ) 284 MG/1.5ML SOSY injection Inject 284 mg into the skin every 6 (six) months. 1.5 mL 0   levETIRAcetam  (KEPPRA ) 750 MG tablet Take 2 tablets (1,500 mg total) by mouth 2 (two) times daily. (Patient taking differently: Take 1,000 mg by mouth 2 (two) times daily.) 360 tablet 4   lubiprostone  (AMITIZA ) 8 MCG capsule Take 1 capsule (8 mcg total) by mouth 2 (two) times daily with a meal. Patient needs follow up appointment for future refills. Please call  [phone] to schedule an appointment. 180 capsule 0   metoprolol  succinate (TOPROL -XL) 25 MG 24 hr tablet Take 1/2 tablet (12.5 mg total) by mouth daily. With or immediately following a meal. 45 tablet 0   Multiple Vitamins-Minerals (MULTIVITAMIN WITH MINERALS) tablet Take 1 tablet by mouth daily.     pravastatin  (PRAVACHOL ) 40 MG tablet Take 1 tablet (40 mg total) by mouth every evening. 90 tablet 3   sildenafil  (REVATIO ) 20 MG tablet Take 2-5 tablets (40-100 mg total) by mouth daily as needed 30 to 60 minutes before planned activity. 30 tablet 11   tamsulosin  (FLOMAX ) 0.4 MG CAPS capsule Take 2 capsules (0.8 mg total) by mouth daily. 180 capsule 3   No current facility-administered medications on file prior to visit.     ROS: Review of Systems  Constitutional: Negative.  Negative for malaise/fatigue.  Respiratory: Negative.  Negative for cough and shortness of breath.   Cardiovascular: Negative.  Negative for chest pain and leg swelling.  Neurological:  Positive for seizures.     BP 137/73   Pulse 78   Resp 20   Ht 5' 10 (1.778 m)   Wt 183 lb (83 kg)   SpO2 95% Comment: RA  BMI 26.26 kg/m   Physical Exam Constitutional:      Appearance: Normal appearance.  HENT:     Head: Normocephalic and atraumatic.  Cardiovascular:     Rate and Rhythm: Normal rate and regular rhythm.     Heart sounds: Normal heart sounds, S1 normal and S2 normal.  Pulmonary:     Effort: Pulmonary effort is normal.     Breath sounds: Normal breath sounds.  Skin:    General: Skin is warm and dry.  Neurological:     General: No focal deficit present.     Mental Status: He is alert and oriented to person, place, and time.      Imaging: CLINICAL DATA:  Aortic aneurysm suspected   EXAM: CT ANGIOGRAPHY CHEST WITH CONTRAST   TECHNIQUE: Multidetector CT imaging through the chest was performed using the standard protocol during bolus administration of intravenous contrast. Multiplanar  reconstructed images and MIPs were obtained and reviewed to evaluate the vascular anatomy.   RADIATION DOSE REDUCTION: This exam was performed according to the departmental dose-optimization program which includes automated exposure control, adjustment of the mA and/or kV according to patient size and/or use of iterative reconstruction technique.   CONTRAST:  75mL OMNIPAQUE  IOHEXOL  350 MG/ML SOLN   COMPARISON:  April 01, 2023, March 19, 2022, March 09, 2020, July 29, 2019   FINDINGS: Pulmonary Embolism: No pulmonary embolism.   Cardiovascular: No cardiomegaly or pericardial effusion. Prior aortic valve replacement with ascending aortic tube graft repair and vein grafts from the aorta to the left circumflex and the LAD territories. There is an unchanged infundibulum at the ostium of the left circumflex vein graft, which is patent. 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 Focal outpouching along the ascending aorta adjacent to the thrombosed LAD vein graft, which measures 1.4 x 2.2 x 1.5 cm (previously, 1.2 x 1.5 x 1.5 cm) (APxTRxCC). The great vessels are widely patent with scattered aortic atherosclerosis noted. No aneurysm within the aortic arch or descending aorta. No aortic dissection.   Mediastinum/Nodes: No mediastinal mass. No mediastinal, hilar, or axillary lymphadenopathy.   Lungs/Pleura: The midline trachea and bronchi are patent. No focal airspace consolidation, pleural effusion, or pneumothorax. Posterior bibasilar dependent atelectasis. Tiny calcified granuloma in the left upper lobe posteriorly.   Musculoskeletal: No acute fracture or destructive bone lesion. Sternotomy wires without sternal dehiscence. Thoracic DISH.   Upper Abdomen: No acute abnormality within the partially visualized upper abdomen.   Review of the MIP images confirms the above findings.   IMPRESSION: 1. Redemonstrated changes of an aortic valve replacement with  tube graft repair of the ascending aorta. 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 2. 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 Similar appearance of the infundibulum of the vein graft to the left circumflex territory. 3. No pneumonia, pulmonary edema, or pleural effusion.   Aortic Atherosclerosis (ICD10-I70.0). Aortic aneurysm (ICD10-I71.9).     Electronically Signed   By: Rogelia Myers M.D.   On: 04/01/2024 10:35     A/P: Thoracic aortic aneurysm without rupture, unspecified part [facility]) -Discussed CTA scan with Dr. Lucas.  There is a pseudoaneurysm along the suture line of the graft that measures roughly the same size as previous year.  Also chronically thrombosed vein graft from aorta to LAD is similar to previous year CTA scan.  After discussion with Dr. Lucas,  will continue to follow at this  time.  He is to return in 1 year with CTA of chest.      Manuelita CHRISTELLA Rough, PA-C 04/15/24

## 2024-04-15 NOTE — Patient Instructions (Signed)
Follow up in one year with CTA of chest

## 2024-04-17 ENCOUNTER — Encounter: Admitting: Physical Therapy

## 2024-04-21 ENCOUNTER — Ambulatory Visit: Admitting: Physical Therapy

## 2024-04-22 ENCOUNTER — Other Ambulatory Visit: Payer: Self-pay | Admitting: Cardiology

## 2024-04-23 ENCOUNTER — Encounter: Payer: Self-pay | Admitting: Physical Therapy

## 2024-04-23 ENCOUNTER — Ambulatory Visit: Attending: Neurosurgery | Admitting: Physical Therapy

## 2024-04-23 ENCOUNTER — Other Ambulatory Visit (HOSPITAL_BASED_OUTPATIENT_CLINIC_OR_DEPARTMENT_OTHER): Payer: Self-pay

## 2024-04-23 DIAGNOSIS — M62830 Muscle spasm of back: Secondary | ICD-10-CM | POA: Insufficient documentation

## 2024-04-23 DIAGNOSIS — M5459 Other low back pain: Secondary | ICD-10-CM | POA: Insufficient documentation

## 2024-04-23 DIAGNOSIS — M5442 Lumbago with sciatica, left side: Secondary | ICD-10-CM | POA: Diagnosis present

## 2024-04-23 DIAGNOSIS — M6281 Muscle weakness (generalized): Secondary | ICD-10-CM | POA: Insufficient documentation

## 2024-04-23 DIAGNOSIS — R252 Cramp and spasm: Secondary | ICD-10-CM | POA: Diagnosis present

## 2024-04-23 DIAGNOSIS — G8929 Other chronic pain: Secondary | ICD-10-CM | POA: Diagnosis present

## 2024-04-23 MED ORDER — METOPROLOL SUCCINATE ER 25 MG PO TB24
12.5000 mg | ORAL_TABLET | Freq: Every day | ORAL | 3 refills | Status: AC
  Filled 2024-04-23: qty 45, 90d supply, fill #0
  Filled 2024-07-23: qty 45, 90d supply, fill #1

## 2024-04-23 NOTE — Therapy (Signed)
 OUTPATIENT PHYSICAL THERAPY TREATMENT      Patient Name: Erik Salazar MRN: [id number] DOB:1951-03-30, 73 y.o., male Today's Date: 04/23/2024  END OF SESSION:  PT End of Session - 04/23/24 0856     Visit Number 16    Date for PT Re-Evaluation 05/12/24    Authorization Type [employer]    Progress Note Due on Visit 22    PT Start Time 0856    PT Stop Time 0936    PT Time Calculation (min) 40 min    Activity Tolerance Patient tolerated treatment well    Behavior During Therapy [facility] for tasks assessed/performed                Past Medical History:  Diagnosis Date   Allergy see attached info   Arthritis    RA and OA   Blood transfusion without reported diagnosis    with 2nd heart surgery   Carpal tunnel syndrome on right 07/08/2017   Cervical radiculopathy at C6 11/14/2017   Right   Complication of anesthesia    Coronary artery disease    Enlarged prostate    Fibromyalgia    H/O: knee surgery    15    Hayfever    WHEN YOUNG   History of blood clots    History of kidney stones    History of open heart surgery    ASCENDING AORTIC ANEURYSM   Ischemic stroke of frontal lobe (HCC) 03/14/2017   Bilateral; post-redo CT surgery   Pneumonia    PONV (postoperative nausea and vomiting)    only after CABG surgeries   Seizure disorder (HCC) 03/14/2017   Seizures (HCC)    last one 02/2021,updated 09/25/22   Status post knee surgery    DVT POST KNEE SURGERY   Stroke [facility])    Past Surgical History:  Procedure Laterality Date   ANTERIOR CERVICAL DECOMP/DISCECTOMY FUSION N/A 05/04/2020   Procedure: Anterior Cervical Decompression/Discectomy Fuion Cervical three-four, Cervical four-five, Cervical five-six;  Surgeon: Onetha Kuba, MD;  Location: [facility] OR;  Service: Neurosurgery;  Laterality: N/A;   BACK SURGERY     lower back 06/28/2021   BALLOON DILATION N/A 06/23/2019   Procedure: BALLOON DILATION;  Surgeon: Elicia Claw, MD;  Location: [facility] ENDOSCOPY;  Service:  Gastroenterology;  Laterality: N/A;   BENTALL PROCEDURE  01/04/2016   Bentall with 23 mm pericardial AVR; SVG-LAD, SVG-CX [facility], [place])   BICEPS TENDON REPAIR Right    BIOPSY  06/23/2019   Procedure: BIOPSY;  Surgeon: Elicia Claw, MD;  Location: [facility] ENDOSCOPY;  Service: Gastroenterology;;   CARDIAC SURGERY     ANUERSYM MAY 2017   Bentall procedure. Bioprosthetic aortic valve #23 mm bovine model #2700 TF ask, and 28 mm Gelweave woven vascular sinus of Valsalva graft   CARDIAC VALVE REPLACEMENT  02/2016, 02/2017   CARPAL TUNNEL RELEASE  08/2018   right hand    COLONOSCOPY WITH PROPOFOL  N/A 10/22/2022   Procedure: COLONOSCOPY WITH PROPOFOL ;  Surgeon: San Sandor GAILS, DO;  Location: [facility];  Service: Gastroenterology;  Laterality: N/A;   CORONARY ARTERY BYPASS GRAFT  01/04/2016   VG to LAD & VG to LCX   CORONARY ARTERY BYPASS GRAFT  03/13/2017   LIMA to LAD with steril abcess removal from dacron graft   CYSTOSCOPY/URETEROSCOPY/HOLMIUM LASER/STENT PLACEMENT Left 06/12/2022   Procedure: CYSTOSCOPY/URETEROSCOPY/HOLMIUM LASER/STENT PLACEMENT;  Surgeon: Carolee Sherwood JONETTA DOUGLAS, MD;  Location: [facility] ORS;  Service: Urology;  Laterality: Left;   ESOPHAGOGASTRODUODENOSCOPY     Had done  dilatation done about 2 or 3 times before in [place]   ESOPHAGOGASTRODUODENOSCOPY (EGD) WITH PROPOFOL  N/A 06/23/2019   Procedure: ESOPHAGOGASTRODUODENOSCOPY (EGD) WITH PROPOFOL ;  Surgeon: Elicia Claw, MD;  Location: [facility];  Service: Gastroenterology;  Laterality: N/A;   FALSE ANEURYSM REPAIR  03/13/2017   redo sternotomy, sterile abscess removal from Dacron graft, omental flap around aorta, CABG: LIMA-LAD ([name], Dr. Zachary Remington)   GREATER OMENTAL FLAP CLOSURE  2018   Of pericardium 2018   INSERTION OF MESH  10/14/2020   Procedure: INSERTION OF MESH;  Surgeon: Sheldon Standing, MD;  Location: [facility] OR;  Service: General;;   JOINT REPLACEMENT  knee   knee surgeries     13 surgeries on knee  done before knee replacement    KNEE SURGERY  1983   LAPAROSCOPIC LYSIS OF ADHESIONS N/A 05/19/2021   Procedure: LYSIS OF ADHESIONS;  Surgeon: Sheldon Standing, MD;  Location: [facility] OR;  Service: General;  Laterality: N/A;  GEN & LOCAL   LYSIS OF ADHESION N/A 10/14/2020   Procedure: LYSIS OF ADHESION;  Surgeon: Sheldon Standing, MD;  Location: [facility] OR;  Service: General;  Laterality: N/A;   open heart surgery     03-13-2017   REPLACEMENT TOTAL KNEE Left 2015   ROTATOR CUFF REPAIR Right    done with biceps tendon repair   SPINE SURGERY  upper neck, lower spine   TONSILLECTOMY     removed as a child.   TOTAL KNEE ARTHROPLASTY Right 01/18/2022   Procedure: RIGHT TOTAL KNEE ARTHROPLASTY;  Surgeon: Jerri Kay HERO, MD;  Location: [facility] OR;  Service: Orthopedics;  Laterality: Right;   VENTRAL HERNIA REPAIR N/A 10/14/2020   Procedure: LAPAROSCOPIC VENTRAL HERNIA REPAIR WITH TAP BLOCK BILATERAL;  Surgeon: Sheldon Standing, MD;  Location: [facility] OR;  Service: General;  Laterality: N/A;   VENTRAL HERNIA REPAIR N/A 05/19/2021   Procedure: LAPAROSCOPIC VENTRAL WALL HERNIA REPAIR;  Surgeon: Sheldon Standing, MD;  Location: [facility] OR;  Service: General;  Laterality: N/A;   Patient Active Problem List   Diagnosis Date Noted   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   Constipation 10/22/2022   Hypertrophied anal papilla 10/22/2022   Colon cancer screening 10/22/2022   Postop check 03/26/2022   Gait abnormality 01/29/2022   Status post total right knee replacement 01/18/2022   Coronary artery disease involving native coronary artery of native heart without angina pectoris 01/04/2022   Preop cardiovascular exam 01/04/2022   Primary osteoarthritis of right knee 01/04/2022   Acute right-sided low back pain without sciatica 12/25/2021   Aortic atherosclerosis (HCC) 09/04/2021   Spondylolisthesis at L4-L5 level 06/28/2021   S/P repair of ventral hernia 05/19/2021   S/P repair of recurrent ventral hernia  05/19/2021   Benign prostatic hyperplasia without lower urinary tract symptoms 10/14/2020   Chronic pain 10/14/2020   ED (erectile dysfunction) of organic origin 10/14/2020   Esophageal dysphagia 10/14/2020   Gastroesophageal reflux disease 10/14/2020   History of aortic valve replacement with bioprosthetic valve 10/14/2020   Hx of aortic aneurysm repair 10/14/2020   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   Thoracic aortic aneurysm without rupture (HCC) 10/14/2020   Recurrent incisional hernia 10/14/2020   Spinal stenosis in cervical region 05/04/2020   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   Cervical radiculopathy at C6 11/14/2017   Carpal tunnel syndrome on right 07/08/2017   Tremor, essential 07/08/2017   Ischemic stroke of frontal lobe (HCC) 04/05/2017   Pain in right hand 04/05/2017   History of omental flap graft  to mediastinum 03/27/2017   Seizure (HCC) 03/14/2017   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/13/2017   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HCC) 03/12/2017   Essential hypertension 02/26/2017   Mixed hyperlipidemia 02/26/2017    PCP: Jason Leita Repine, FNP   REFERRING PROVIDER: Onetha Kuba, MD   REFERRING DIAG: [phone] (ICD-10-CM) - Spondylolisthesis, lumbar region   THERAPY DIAG:  Other low back pain  Muscle spasm of back  Muscle weakness (generalized)  RATIONALE FOR EVALUATION AND TREATMENT: Rehabilitation  ONSET DATE: Acute on chronic x ~2 months  NEXT MD VISIT: ~05/04/2024   SUBJECTIVE:                                                                                                                                                                                                         SUBJECTIVE STATEMENT: Pt reports the recent round of nerve block injections made his pain worse and has made mobility harder.   PAIN: Are you having pain? Yes: NPRS scale: 7/10   Pain location: Midline lumbosacral spine  Pain description: steady/constant aching or throbbing   Aggravating factors: prolonged sitting or standing, riding in the car  Relieving factors: changing positions, TENS during exercises, foam rolling to back   PERTINENT HISTORY:  History chronic back pain, multiple back surgeries, ACDF, B TKA, RA and OA, DVT, CVA frontal lobe with memory deficits, seizures, open heart surgery to repair aneurism, CABG x 2, omental flap graft to mediastinum, hernia repair.   PRECAUTIONS: None  RED FLAGS: None  WEIGHT BEARING RESTRICTIONS: No  FALLS:  Has patient fallen in last 6 months? No  LIVING ENVIRONMENT:  Lives with: lives with their spouse Lives in: House/apartment Stairs: No Has following equipment at home: Single point cane, Walker - 2 wheeled, and bed side commode  OCCUPATION: Retired  PLOF: Independent and Leisure: exercises frequently - walk treadmill    PATIENT GOALS: Take away the pain.   OBJECTIVE: (objective measures completed at initial evaluation unless otherwise dated)  DIAGNOSTIC FINDINGS:  01/26/24 - MR Lumbar Spine:  IMPRESSION: 1. Interval posterior lumbar interbody fusion and decompression at L5-S1. No central canal or foraminal stenosis. 2. At L2-3 there is a mild disc bulge. Mild bilateral facet arthropathy. Mild bilateral lateral recess stenosis. Moderate-severe left foraminal stenosis. Mild-moderate right foraminal stenosis. Mild central canal stenosis. 3. At L3-4 there is a moderate disc bulge. Mild bilateral facet arthropathy. Mild bilateral lateral recess stenosis and mild central canal stenosis. Moderate bilateral foraminal stenosis. 4. At L4-5 there is a mild disc bulge. No significant stenosis. Moderate bilateral foraminal stenosis.  5. No acute osseous injury of the lumbar spine.  04/08/21 - MR Lumbar Spine:  IMPRESSION: No change is appreciated compared to the study of last year.   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 Pseudo disc herniation. Bilateral foraminal stenosis could possibly  affect either or both exiting L5 nerves.   L3-4: Mild multifactorial stenosis secondary to bulging of the disc and mild facet and ligamentous prominence. No visible neural compression.   Other levels show disc bulges and facet degeneration but no apparent neural compressive stenosis.  PATIENT SURVEYS:  Modified Oswestry 25 / 50 = 50.0 %, severe disability  03/19/24: 19 / 50 = 38.0 %, moderate disability  SCREENING FOR RED FLAGS: Bowel or bladder incontinence: No Spinal tumors: No Cauda equina syndrome: No Compression fracture: No Abdominal aneurysm: No  COGNITION:  Overall cognitive status: Impaired - STM   SENSATION: WFL Numbness in hands from prior CVA  POSTURE:  decreased lumbar lordosis  PALPATION: TTP midline lumbar spine and over prior surgical incision. Increased muscle tension in B lumbar paraspinals but denies TTP.  LUMBAR ROM:   Active  Eval - p! w/ all motions 02/27/24 03/31/24  Flexion Hands to knees Hands to ankles Hands to ankles  Extension 60% limited 50% limited 25% limited  Right lateral flexion Hand to lateral knee Lateral knee Hand to mid calf - p! R midline  Left lateral flexion Hand to lateral knee Lateral knee Hand to mid calf  Right rotation 40% limited  25% limited - p! R midline  Left rotation 40% limited  15% limited  (Blank rows = not tested)  MUSCLE LENGTH: Hamstrings: mod tight R, mild tight L ITB: mod tight R, mild tight L Piriformis: mod/severe tight B Hip flexors: mod tight B Quads: mod tight B Heelcord:   LOWER EXTREMITY ROM:    Limited d/t h/o TKA and low back pain   LOWER EXTREMITY MMT:    MMT Right eval Left eval R 03/13/24 L 03/13/24 R 03/31/24 L 03/31/24  Hip flexion 4 4+ 4+ 4+ 4+ 4+  Hip extension 4- 4 4- 4- 4 4-  Hip abduction 4 4+ 4 4+ 4+ 4  Hip adduction 4+ 4+   4+ 4+  Hip internal rotation 4- p! Lateral knee 4+ 4 4+ 4 p! lateral knee 4+  Hip external rotation 4- p! Lateral knee 4 4+ 4+ 4 p! lateral knee 4+  Knee flexion  4+ 5 5 5 5 5   Knee extension 4+ 5 4 5 5 5   Ankle dorsiflexion 3+ 3+ 4- 4- 4 4  Ankle plantarflexion        Ankle inversion        Ankle eversion         (Blank rows = not tested)  LUMBAR SPECIAL TESTS:  Straight leg raise test: Positive   TODAY'S TREATMENT:   04/23/24 THERAPEUTIC EXERCISE: To improve strength, endurance, ROM, and flexibility.  Demonstration, verbal and tactile cues throughout for technique.  Rec Bike - L4 x 10 min Standing lumbar flexion stretch holding onto wall ladder 3 x 30 Standing lumbar flexion stretch holding onto wall ladder with slight lateral emphasis 2 x 30 bil Supine DKTC with heels on orange Pball 2 x 10 Supine LTR with lower legs on orange Pball x 10 Hooklying TrA + hip flexion march 3 x 10, last 2 sets with looped GTB at knees  MANUAL THERAPY: To promote normalized muscle tension and pain modulation utilizing connective tissue massage and therapeutic  massage.  IASTM with foam roller to B lumbar paraspinals   04/09/24 THERAPEUTIC EXERCISE: To improve strength, endurance, and flexibility.  Demonstration, verbal and tactile cues throughout for technique.  Rec Bike - L4 x 10 min Seated 3-way roll outs w/ green pball x 30 forward and x 10 to each side   NEUROMUSCULAR RE-EDUCATION: To improve balance, coordination, kinesthesia, posture, and proprioception. Standing GTB 4-way SLR x 10 bil - mostly w/o UE support but intermittent light UE support on back of chair for hip extension and abduction Wall pushups focusing on core stability (avoiding hip hinge or sway back) 2 x 10  THERAPEUTIC ACTIVITIES: To improve functional performance.  Demonstration, verbal and tactile cues throughout for technique. Floor to hip height 10# kettle bell lift focusing on hip and knee flexion (squatting) while avoiding trunk flexion/extension x 10 Floor to chest height 10# kettle bell lift focusing on hip and knee flexion (squatting) while avoiding trunk flexion/extension -  deferred d/t increased LBP  MANUAL THERAPY: To promote normalized muscle tension and pain modulation utilizing connective tissue massage and therapeutic massage.  IASTM with foam roller to B lumbar paraspinals   04/06/24 THERAPEUTIC EXERCISE: To improve strength, endurance, ROM, and flexibility Nustep L5x10 min Seated fwd roll outs w/ green pball x 30   THERAPEUTIC ACTIVITIES: To improve functional performance.  Demonstration, verbal and tactile cues throughout for technique.  Standing side bend with 10lb kettle bell x 15 B Diagonal bend and reach to elevated mat table 10lb each side 2x10 Chop with blue TB x 10 B NEUROMUSCULAR RE-EDUCATION: To improve kinesthesia, posture, and proprioception.  Seated on green pball:  4 way Weight Shifts x20 B  Trunk rotations x 10 B Marches x 15 B Seated LAQs x20 B  Standing lat pull down + march Blue TB 2x10   04/02/24 THERAPEUTIC EXERCISE: To improve strength, endurance, ROM, and flexibility Rec Bike L4x10 min Seated 3-way roll outs w/ green pball x10 ea way  Seated Chest Press 20# 2x15   THERAPEUTIC ACTIVITIES: To improve functional performance.  Demonstration, verbal and tactile cues throughout for technique.  Farmers Carry 2x5# kb, 2x10# kb (20')- cues to maintain posture  Suitcase carries 10# kb 4x20'- cues to be careful of leaning to weighted side   NEUROMUSCULAR RE-EDUCATION: To improve kinesthesia, posture, and proprioception.  STS 2x10- 5# kb at chest with cues for upright posture  Seated on green pball in corner:  Seated Weight Shifts x20 B  Seated Marches x20  Seated Marches with opposite arm raise x10- pt had difficulty with rhythm  Seated LAQs x10 B  Standing on airex pad in corner: ball tosses + catches- 2x10 (chest pass)  Standing on airex pad in corner: ball tosses + catches- 2x10 (overhead pass and catch) Standing Scap Squeeze + Shoulder Row Blue TB 2x10  Standing Scap Squeeze + Shoulder Ext Blue TB x20     03/31/24 THERAPEUTIC EXERCISE: To improve strength, endurance, and ROM.  Demonstration, verbal and tactile cues throughout for technique. Rec Bike - L4 x 10 min Verbal HEP review Bridge x 10 Bridge + GTB hip ABD x 10 - pt reporting better awareness of glute activation Standing hip extension with slight fwd lean   THERAPEUTIC ACTIVITIES: To improve functional performance.  Demonstration, verbal and tactile cues throughout for technique. Lumbar ROM assessment LE MMT Goal assessment  SELF CARE: Provided education to increase independence with ADLs and to facilitate performance of basic household cleaning/chores.  Provided education in proper posture and body  mechanics for typical daily positioning and household chores to minimize strain on low back and neck. Information provided on home TENS unit option (unit used in clinic during exercises).   03/23/24 THERAPEUTIC EXERCISE: To improve strength, endurance, and flexibility. Nustep L6x53min UE/LE  TENS unit utilized to B thoracolumbar PS during session for pain modulation: NEUROMUSCULAR RE-EDUCATION: To improve balance, posture, and proprioception. Standing TrA +  shoulder row Blue TB x 20 Standing TrA +  shoulder extension  Blue TB x 20 Standing trunk rotation Blue TB x 10 B Standing pallof press Blue TB 2 x 10 B Clock balance from airex to floor 12 to 6 o'clock  Toe taps from airex to 8' step x 20 Ball toss and catch from airex Sit to stands x 15 Posture/body mechanics education   03/19/2024 THERAPEUTIC EXERCISE: To improve strength and endurance.   TM 2.5 mph x 10'  THERAPEUTIC ACTIVITIES: To improve functional performance.  Demonstration, verbal and tactile cues throughout for technique. Modified Oswestry: 19 / 50 = 38.0 %, moderate disability  NEUROMUSCULAR RE-EDUCATION: To improve balance, coordination, kinesthesia, posture, and proprioception. Standing TrA +  shoulder row GTB x 10, Blue TB x 10 Standing TrA +  shoulder  extension GTB x 10, Blue TB x 10  MODALITIES: Auvon TENS unit with 1 channel on each thoracolumbar paraspinals, intensity to pt tolerance during therapeutic exercises  MANUAL THERAPY: To promote normalized muscle tension, improved flexibility, pain modulation, and reduced pain utilizing connective tissue massage and therapeutic massage.  IASTM with foam roller to R>L lumbar paraspinals   PATIENT EDUCATION:  Education details: continue with current HEP  Person educated: Patient Education method: Explanation Education comprehension: verbalized understanding  HOME EXERCISE PROGRAM: Access Code: [email or web address] URL: [email or web address] Date: 03/31/2024 Prepared by: Elijah Hidden  Exercises - Supine Hamstring Stretch with Strap  - 2 x daily - 7 x weekly - 3 reps - 30 sec hold - Supine Iliotibial Band Stretch with Strap  - 2 x daily - 7 x weekly - 3 reps - 30 sec hold - Standing Shoulder Row with Anchored Resistance  - 1 x daily - 7 x weekly - 3 sets - 10 reps - Shoulder extension with resistance - Neutral  - 1 x daily - 7 x weekly - 3 sets - 10 reps - Standing Anti-Rotation Press with Anchored Resistance  - 1 x daily - 7 x weekly - 3 sets - 10 reps - Hooklying Clamshell with Resistance  - 1 x daily - 7 x weekly - 2 sets - 10 reps - 3 sec hold - Bridge with Hip Abduction and Resistance - Ground Touches  - 1 x daily - 3-4 x weekly - 2 sets - 10 reps - 3 sec hold - Prone Hip Extension on Table  - 1 x daily - 3-4 x weekly - 2 sets - 10 reps - 3 sec hold  Patient Education - Posture and Body Mechanics   ASSESSMENT:  CLINICAL IMPRESSION: Hassen reports increased pain since the latest round of nerve block injections and notes more difficulty working through [employer] and performing ADL's since the injections.  Therapeutic exercises focusing on flexion activities to open up space in lumbar spine for decompression as well as MT for STM/IASTM to reduce muscle tightness and pain with pt noting  some relief by end of session.  Ehtan will benefit from skilled PT to address ongoing ROM, flexibility and strength deficits to improve mobility and activity tolerance with decreased pain interference.   Eval:  Kester Stimpson is a 73 y.o. male who was referred to physical therapy for evaluation and treatment for acute on chronic LBP secondary to lumbar spondylolisthesis.  Patient reports onset of current midline low back pain beginning ~2 months ago without known MOI.  He is unable to isolate aggravating or relieving factors.  Patient has deficits in lumbar ROM, proximal LE flexibility, B LE strength, abnormal posture, and TTP with abnormal muscle tension  which are interfering with ADLs and are impacting quality of life.  On Modified Oswestry patient scored 25/50 demonstrating 50% or severe disability.  Nero will benefit from skilled PT to address above deficits to improve mobility and activity tolerance with decreased pain interference.  Attempt at initial HEP creation limited by increased pain.  PT referral included orders for TPDN - patient made aware of recent change in rehab policy requiring OOP cash pay for TPDN at time of service in order to remain in regulatory and legal compliance with billing.  OBJECTIVE IMPAIRMENTS: decreased activity tolerance, decreased endurance, decreased knowledge of condition, decreased mobility, difficulty walking, decreased ROM, decreased strength, increased fascial restrictions, impaired perceived functional ability, increased muscle spasms, impaired flexibility, improper body mechanics, postural dysfunction, and pain.  ACTIVITY LIMITATIONS: carrying, lifting, bending, sitting, standing, squatting, sleeping, stairs, transfers, bed mobility, locomotion level, and caring for others   PARTICIPATION LIMITATIONS: meal prep, cleaning, laundry, driving, shopping, community activity, and yard work  PERSONAL FACTORS: Age, Fitness, Past/current experiences, Time since onset of  injury/illness/exacerbation, and 3+ comorbidities: History chronic back pain, multiple back surgeries, ACDF, B TKA, RA and OA, DVT, CVA frontal lobe with memory deficits, seizures, open heart surgery to repair aneurism, CABG x 2, omental flap graft to mediastinum, hernia repair.  are also affecting patient's functional outcome.   REHAB POTENTIAL: Good  CLINICAL DECISION MAKING: Evolving/moderate complexity  EVALUATION COMPLEXITY: Moderate   GOALS: Goals reviewed with patient? Yes  SHORT TERM GOALS: Target date: 03/02/2024  Patient will be independent with initial HEP to improve outcomes and carryover.  Baseline: Partial initial HEP provided on eval but limited by pain Goal status: MET - 02/24/24   2.  Patient will report 25% improvement in low back pain to improve QOL. Baseline: 7/10 on eval, up to 10/10 at worst 02/24/24 - no significant change since eval 03/02/24 - pain increased since ESIs Goal status: MET - 03/06/24 - pt reports 25% improvement in LBP    LONG TERM GOALS: Target date: 03/30/2024, extended to 05/12/2024  Patient will be independent with ongoing/advanced HEP for self-management at home.  Baseline:  03/17/24 - met for current HEP, anticipate further updates Goal status: IN PROGRESS - 03/31/24 - HEP reviewed and updated  2.  Patient will report 50-75% improvement in low back pain to improve QOL.  Baseline: 7/10 on eval, up to 10/10 at worst 03/17/24 - 10-15% improvement currently  03/31/24 - 20-25% improvement Goal status: IN PROGRESS - 04/23/24 - pain worse since latest round of nerve block injections  3.  Patient to demonstrate ability to achieve and maintain good spinal alignment/posturing and body mechanics needed for daily activities. Baseline:  03/31/24 - reviewed posture and body mechanics for typical daily activities with handout provided Goal status: PARTIALLY MET - 04/09/24 - Pt denied any questions but cues still needed for proper lifting mechanics during  visit  4.  Patient will demonstrate functional pain free lumbar ROM to perform ADLs.   Baseline: Refer to above lumbar ROM table Goal status: IN PROGRESS - 03/31/24 - Improving ROM  with pain now only associated with extension > flexion and R side-bending and rotation  5.  Patient will demonstrate improved B LE strength to >/= 4+/5 for improved stability and ease of mobility. Baseline: Refer to above LE MMT table Goal status: PARTIALLY MET - 03/31/24 - continued weakness in B hip extension and some lateral motions  6. Patient will report </= 38% on Modified Oswestry (MCID = 12%) to demonstrate improved functional ability with decreased pain interference. Baseline: 25 / 50 = 50.0 % Goal status: MET - 03/19/24 - 19 / 50 = 38.0 %  7.  Patient will tolerate 20-30 min of sitting or standing w/o increased pain to allow for  improved mobility and activity tolerance. Baseline: Per modified Oswestry pain limits sitting >1/2-hour and standing >10 minutes Goal status: IN PROGRESS - 03/31/24 - Pt reports he is able to sit for 20-30 minutes before needing to change position due to pain   PLAN:  PT FREQUENCY: 2x/week  PT DURATION: 6-8 weeks  PLANNED INTERVENTIONS: 02835- PT Re-evaluation, 97750- Physical Performance Testing, 97110-Therapeutic exercises, 97530- Therapeutic activity, [id number]- Neuromuscular re-education, 97535- Self Care, 02859- Manual therapy, [id number]- Gait training, [phone]- Aquatic Therapy, [id number]- Electrical stimulation (unattended), 97035- Ultrasound, 79439 (1-2 muscles), 20561 (3+ muscles)- Dry Needling, Patient/Family education, Balance training, Taping, Joint mobilization, Spinal mobilization, Cryotherapy, and Moist heat  PLAN FOR NEXT SESSION:  Incorporate more lumbar rotational and side bend motion gently; Progress core/lumbopelvic (emphasis on hip extension) and ankle strengthening; Continue machine weight training with core stabilization;  Ball stabilization exercises.  E-stim/TENS as  benefit noted - provide education in self-setup for home unit PRN if pt purchases TENS unit.       Elijah CHRISTELLA Hidden, PT 04/23/2024, 3:05 PM

## 2024-04-24 ENCOUNTER — Other Ambulatory Visit (HOSPITAL_BASED_OUTPATIENT_CLINIC_OR_DEPARTMENT_OTHER): Payer: Self-pay

## 2024-04-24 ENCOUNTER — Other Ambulatory Visit: Payer: Self-pay | Admitting: Family

## 2024-04-24 MED ORDER — COVID-19 MRNA VAC-TRIS(PFIZER) 30 MCG/0.3ML IM SUSY
0.3000 mL | PREFILLED_SYRINGE | Freq: Once | INTRAMUSCULAR | 0 refills | Status: AC
  Filled 2024-04-24 – 2024-05-01 (×2): qty 0.3, 1d supply, fill #0

## 2024-04-27 ENCOUNTER — Other Ambulatory Visit (HOSPITAL_BASED_OUTPATIENT_CLINIC_OR_DEPARTMENT_OTHER): Payer: Self-pay

## 2024-04-27 ENCOUNTER — Ambulatory Visit (INDEPENDENT_AMBULATORY_CARE_PROVIDER_SITE_OTHER): Admitting: Podiatry

## 2024-04-27 DIAGNOSIS — B070 Plantar wart: Secondary | ICD-10-CM

## 2024-04-27 NOTE — Progress Notes (Signed)
 Patient presents follow-up for plantar verruca right foot.  Says doing much better not hurting as much.   Physical exam:  General appearance: Pleasant, and in no acute distress. AOx3.  Vascular: Pedal pulses: DP 2/4 bilaterally, PT 2/4 bilaterally. Miod edema lower legs bilaterally.   Neurological: Grossly intact bilaterally  Dermatologic:   Verrucous lesion about 2 mm in diameter plantar lateral aspect right foot with skin lines going around the lesion and some small pinpoint hemorrhages.  Reduced in size and depth from last visit.  Skin normal temperature bilaterally.  Skin normal color, tone, and texture bilaterally.   Musculoskeletal:    Diagnosis: Plantar verruca right  Plan: -Responded well to first Salinocaine treatment.  Will do a second today. -Applied Salinocaine compound to lesion(s) as noted in physical exam after debriding lesions to pinpoint bleeding.  Salinocaine applied to lesion(s) and covered with an occlusive dressing with Coban wrap.  Written and oral instructions given to patient.    Return 2 weeks follow-up wart

## 2024-04-28 ENCOUNTER — Ambulatory Visit: Admitting: Physical Therapy

## 2024-04-30 ENCOUNTER — Encounter: Payer: Self-pay | Admitting: Cardiology

## 2024-04-30 ENCOUNTER — Ambulatory Visit

## 2024-04-30 ENCOUNTER — Other Ambulatory Visit (HOSPITAL_BASED_OUTPATIENT_CLINIC_OR_DEPARTMENT_OTHER): Payer: Self-pay

## 2024-04-30 DIAGNOSIS — R252 Cramp and spasm: Secondary | ICD-10-CM

## 2024-04-30 DIAGNOSIS — M6281 Muscle weakness (generalized): Secondary | ICD-10-CM

## 2024-04-30 DIAGNOSIS — M5459 Other low back pain: Secondary | ICD-10-CM | POA: Diagnosis not present

## 2024-04-30 DIAGNOSIS — M62830 Muscle spasm of back: Secondary | ICD-10-CM

## 2024-04-30 DIAGNOSIS — G8929 Other chronic pain: Secondary | ICD-10-CM

## 2024-04-30 MED ORDER — PREVNAR 20 0.5 ML IM SUSY
0.5000 mL | PREFILLED_SYRINGE | Freq: Once | INTRAMUSCULAR | 0 refills | Status: AC
  Filled 2024-04-30: qty 0.5, 1d supply, fill #0

## 2024-04-30 MED ORDER — FLUZONE HIGH-DOSE 0.5 ML IM SUSY
0.5000 mL | PREFILLED_SYRINGE | Freq: Once | INTRAMUSCULAR | 0 refills | Status: AC
Start: 2024-04-30 — End: 2024-05-01
  Filled 2024-04-30: qty 0.5, 1d supply, fill #0

## 2024-04-30 NOTE — Therapy (Signed)
 OUTPATIENT PHYSICAL THERAPY TREATMENT      Patient Name: Erik Salazar MRN: [id number] DOB:23-Jun-1951, 73 y.o., male Today's Date: 04/30/2024  END OF SESSION:  PT End of Session - 04/30/24 1013     Visit Number 17    Date for PT Re-Evaluation 05/12/24    Authorization Type [employer]    Progress Note Due on Visit 22    PT Start Time 0932    PT Stop Time 1014    PT Time Calculation (min) 42 min    Activity Tolerance Patient tolerated treatment well    Behavior During Therapy WFL for tasks assessed/performed                 Past Medical History:  Diagnosis Date   Allergy see attached info   Arthritis    RA and OA   Blood transfusion without reported diagnosis    with 2nd heart surgery   Carpal tunnel syndrome on right 07/08/2017   Cervical radiculopathy at C6 11/14/2017   Right   Complication of anesthesia    Coronary artery disease    Enlarged prostate    Fibromyalgia    H/O: knee surgery    15    Hayfever    WHEN YOUNG   History of blood clots    History of kidney stones    History of open heart surgery    ASCENDING AORTIC ANEURYSM   Ischemic stroke of frontal lobe (HCC) 03/14/2017   Bilateral; post-redo CT surgery   Pneumonia    PONV (postoperative nausea and vomiting)    only after CABG surgeries   Seizure disorder (HCC) 03/14/2017   Seizures (HCC)    last one 02/2021,updated 09/25/22   Status post knee surgery    DVT POST KNEE SURGERY   Stroke [facility])    Past Surgical History:  Procedure Laterality Date   ANTERIOR CERVICAL DECOMP/DISCECTOMY FUSION N/A 05/04/2020   Procedure: Anterior Cervical Decompression/Discectomy Fuion Cervical three-four, Cervical four-five, Cervical five-six;  Surgeon: Onetha Kuba, MD;  Location: [facility] OR;  Service: Neurosurgery;  Laterality: N/A;   BACK SURGERY     lower back 06/28/2021   BALLOON DILATION N/A 06/23/2019   Procedure: BALLOON DILATION;  Surgeon: Elicia Claw, MD;  Location: [facility] ENDOSCOPY;  Service:  Gastroenterology;  Laterality: N/A;   BENTALL PROCEDURE  01/04/2016   Bentall with 23 mm pericardial AVR; SVG-LAD, SVG-CX [facility], [place])   BICEPS TENDON REPAIR Right    BIOPSY  06/23/2019   Procedure: BIOPSY;  Surgeon: Elicia Claw, MD;  Location: [facility];  Service: Gastroenterology;;   CARDIAC SURGERY     ANUERSYM MAY 2017   Bentall procedure. Bioprosthetic aortic valve #23 mm bovine model #2700 TF ask, and 28 mm Gelweave woven vascular sinus of Valsalva graft   CARDIAC VALVE REPLACEMENT  02/2016, 02/2017   CARPAL TUNNEL RELEASE  08/2018   right hand    COLONOSCOPY WITH PROPOFOL  N/A 10/22/2022   Procedure: COLONOSCOPY WITH PROPOFOL ;  Surgeon: San Sandor GAILS, DO;  Location: [facility];  Service: Gastroenterology;  Laterality: N/A;   CORONARY ARTERY BYPASS GRAFT  01/04/2016   VG to LAD & VG to LCX   CORONARY ARTERY BYPASS GRAFT  03/13/2017   LIMA to LAD with steril abcess removal from dacron graft   CYSTOSCOPY/URETEROSCOPY/HOLMIUM LASER/STENT PLACEMENT Left 06/12/2022   Procedure: CYSTOSCOPY/URETEROSCOPY/HOLMIUM LASER/STENT PLACEMENT;  Surgeon: Carolee Sherwood JONETTA DOUGLAS, MD;  Location: [facility] ORS;  Service: Urology;  Laterality: Left;   ESOPHAGOGASTRODUODENOSCOPY     Had  done dilatation done about 2 or 3 times before in [place]   ESOPHAGOGASTRODUODENOSCOPY (EGD) WITH PROPOFOL  N/A 06/23/2019   Procedure: ESOPHAGOGASTRODUODENOSCOPY (EGD) WITH PROPOFOL ;  Surgeon: Elicia Claw, MD;  Location: [facility];  Service: Gastroenterology;  Laterality: N/A;   FALSE ANEURYSM REPAIR  03/13/2017   redo sternotomy, sterile abscess removal from Dacron graft, omental flap around aorta, CABG: LIMA-LAD ([name], Dr. Zachary Remington)   GREATER OMENTAL FLAP CLOSURE  2018   Of pericardium 2018   INSERTION OF MESH  10/14/2020   Procedure: INSERTION OF MESH;  Surgeon: Sheldon Standing, MD;  Location: [facility] OR;  Service: General;;   JOINT REPLACEMENT  knee   knee surgeries     13 surgeries on knee  done before knee replacement    KNEE SURGERY  1983   LAPAROSCOPIC LYSIS OF ADHESIONS N/A 05/19/2021   Procedure: LYSIS OF ADHESIONS;  Surgeon: Sheldon Standing, MD;  Location: [facility] OR;  Service: General;  Laterality: N/A;  GEN & LOCAL   LYSIS OF ADHESION N/A 10/14/2020   Procedure: LYSIS OF ADHESION;  Surgeon: Sheldon Standing, MD;  Location: [facility] OR;  Service: General;  Laterality: N/A;   open heart surgery     03-13-2017   REPLACEMENT TOTAL KNEE Left 2015   ROTATOR CUFF REPAIR Right    done with biceps tendon repair   SPINE SURGERY  upper neck, lower spine   TONSILLECTOMY     removed as a child.   TOTAL KNEE ARTHROPLASTY Right 01/18/2022   Procedure: RIGHT TOTAL KNEE ARTHROPLASTY;  Surgeon: Jerri Kay HERO, MD;  Location: [facility] OR;  Service: Orthopedics;  Laterality: Right;   VENTRAL HERNIA REPAIR N/A 10/14/2020   Procedure: LAPAROSCOPIC VENTRAL HERNIA REPAIR WITH TAP BLOCK BILATERAL;  Surgeon: Sheldon Standing, MD;  Location: [facility] OR;  Service: General;  Laterality: N/A;   VENTRAL HERNIA REPAIR N/A 05/19/2021   Procedure: LAPAROSCOPIC VENTRAL WALL HERNIA REPAIR;  Surgeon: Sheldon Standing, MD;  Location: [facility] OR;  Service: General;  Laterality: N/A;   Patient Active Problem List   Diagnosis Date Noted   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   Constipation 10/22/2022   Hypertrophied anal papilla 10/22/2022   Colon cancer screening 10/22/2022   Postop check 03/26/2022   Gait abnormality 01/29/2022   Status post total right knee replacement 01/18/2022   Coronary artery disease involving native coronary artery of native heart without angina pectoris 01/04/2022   Preop cardiovascular exam 01/04/2022   Primary osteoarthritis of right knee 01/04/2022   Acute right-sided low back pain without sciatica 12/25/2021   Aortic atherosclerosis (HCC) 09/04/2021   Spondylolisthesis at L4-L5 level 06/28/2021   S/P repair of ventral hernia 05/19/2021   S/P repair of recurrent ventral hernia  05/19/2021   Benign prostatic hyperplasia without lower urinary tract symptoms 10/14/2020   Chronic pain 10/14/2020   ED (erectile dysfunction) of organic origin 10/14/2020   Esophageal dysphagia 10/14/2020   Gastroesophageal reflux disease 10/14/2020   History of aortic valve replacement with bioprosthetic valve 10/14/2020   Hx of aortic aneurysm repair 10/14/2020   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   Thoracic aortic aneurysm without rupture (HCC) 10/14/2020   Recurrent incisional hernia 10/14/2020   Spinal stenosis in cervical region 05/04/2020   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   Cervical radiculopathy at C6 11/14/2017   Carpal tunnel syndrome on right 07/08/2017   Tremor, essential 07/08/2017   Ischemic stroke of frontal lobe (HCC) 04/05/2017   Pain in right hand 04/05/2017   History of omental flap  graft to mediastinum 03/27/2017   Seizure (HCC) 03/14/2017   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/13/2017   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HCC) 03/12/2017   Essential hypertension 02/26/2017   Mixed hyperlipidemia 02/26/2017    PCP: Jason Leita Repine, FNP   REFERRING PROVIDER: Onetha Kuba, MD   REFERRING DIAG: [phone] (ICD-10-CM) - Spondylolisthesis, lumbar region   THERAPY DIAG:  Other low back pain  Muscle spasm of back  Muscle weakness (generalized)  Chronic midline low back pain with left-sided sciatica  Cramp and spasm  RATIONALE FOR EVALUATION AND TREATMENT: Rehabilitation  ONSET DATE: Acute on chronic x ~2 months  NEXT MD VISIT: ~05/04/2024   SUBJECTIVE:                                                                                                                                                                                                         SUBJECTIVE STATEMENT: Pt reports he had a mini seizure two days ago  PAIN: Are you having pain? Yes: NPRS scale: 5/10   Pain location: Midline lumbosacral spine  Pain description: steady/constant aching  or throbbing  Aggravating factors: prolonged sitting or standing, riding in the car  Relieving factors: changing positions, TENS during exercises, foam rolling to back   PERTINENT HISTORY:  History chronic back pain, multiple back surgeries, ACDF, B TKA, RA and OA, DVT, CVA frontal lobe with memory deficits, seizures, open heart surgery to repair aneurism, CABG x 2, omental flap graft to mediastinum, hernia repair.   PRECAUTIONS: None  RED FLAGS: None  WEIGHT BEARING RESTRICTIONS: No  FALLS:  Has patient fallen in last 6 months? No  LIVING ENVIRONMENT:  Lives with: lives with their spouse Lives in: House/apartment Stairs: No Has following equipment at home: Single point cane, Walker - 2 wheeled, and bed side commode  OCCUPATION: Retired  PLOF: Independent and Leisure: exercises frequently - walk treadmill    PATIENT GOALS: Take away the pain.   OBJECTIVE: (objective measures completed at initial evaluation unless otherwise dated)  DIAGNOSTIC FINDINGS:  01/26/24 - MR Lumbar Spine:  IMPRESSION: 1. Interval posterior lumbar interbody fusion and decompression at L5-S1. No central canal or foraminal stenosis. 2. At L2-3 there is a mild disc bulge. Mild bilateral facet arthropathy. Mild bilateral lateral recess stenosis. Moderate-severe left foraminal stenosis. Mild-moderate right foraminal stenosis. Mild central canal stenosis. 3. At L3-4 there is a moderate disc bulge. Mild bilateral facet arthropathy. Mild bilateral lateral recess stenosis and mild central canal stenosis. Moderate bilateral foraminal stenosis. 4. At L4-5 there is a mild disc bulge. No significant  stenosis. Moderate bilateral foraminal stenosis. 5. No acute osseous injury of the lumbar spine.  04/08/21 - MR Lumbar Spine:  IMPRESSION: No change is appreciated compared to the study of last year.   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 Pseudo disc herniation. Bilateral foraminal stenosis  could possibly affect either or both exiting L5 nerves.   L3-4: Mild multifactorial stenosis secondary to bulging of the disc and mild facet and ligamentous prominence. No visible neural compression.   Other levels show disc bulges and facet degeneration but no apparent neural compressive stenosis.  PATIENT SURVEYS:  Modified Oswestry 25 / 50 = 50.0 %, severe disability  03/19/24: 19 / 50 = 38.0 %, moderate disability  SCREENING FOR RED FLAGS: Bowel or bladder incontinence: No Spinal tumors: No Cauda equina syndrome: No Compression fracture: No Abdominal aneurysm: No  COGNITION:  Overall cognitive status: Impaired - STM   SENSATION: WFL Numbness in hands from prior CVA  POSTURE:  decreased lumbar lordosis  PALPATION: TTP midline lumbar spine and over prior surgical incision. Increased muscle tension in B lumbar paraspinals but denies TTP.  LUMBAR ROM:   Active  Eval - p! w/ all motions 02/27/24 03/31/24 04/30/24  Flexion Hands to knees Hands to ankles Hands to ankles Ankles- p!  Extension 60% limited 50% limited 25% limited 15% limit- p!  Right lateral flexion Hand to lateral knee Lateral knee Hand to mid calf - p! R midline Mid calf- mild p!  Left lateral flexion Hand to lateral knee Lateral knee Hand to mid calf Mid calf- mild p!  Right rotation 40% limited  25% limited - p! R midline   Left rotation 40% limited  15% limited   (Blank rows = not tested)  MUSCLE LENGTH: Hamstrings: mod tight R, mild tight L ITB: mod tight R, mild tight L Piriformis: mod/severe tight B Hip flexors: mod tight B Quads: mod tight B Heelcord:   LOWER EXTREMITY ROM:    Limited d/t h/o TKA and low back pain   LOWER EXTREMITY MMT:    MMT Right eval Left eval R 03/13/24 L 03/13/24 R 03/31/24 L 03/31/24  Hip flexion 4 4+ 4+ 4+ 4+ 4+  Hip extension 4- 4 4- 4- 4 4-  Hip abduction 4 4+ 4 4+ 4+ 4  Hip adduction 4+ 4+   4+ 4+  Hip internal rotation 4- p! Lateral knee 4+ 4 4+ 4 p! lateral knee 4+   Hip external rotation 4- p! Lateral knee 4 4+ 4+ 4 p! lateral knee 4+  Knee flexion 4+ 5 5 5 5 5   Knee extension 4+ 5 4 5 5 5   Ankle dorsiflexion 3+ 3+ 4- 4- 4 4  Ankle plantarflexion        Ankle inversion        Ankle eversion         (Blank rows = not tested)  LUMBAR SPECIAL TESTS:  Straight leg raise test: Positive   TODAY'S TREATMENT:  04/30/24 THERAPEUTIC EXERCISE: To improve strength, endurance, ROM, and flexibility.  Demonstration, verbal and tactile cues throughout for technique.  Rec Bike - L4 x 10 min NEUROMUSCULAR RE-EDUCATION: To improve balance, coordination, kinesthesia, posture, and proprioception. Standing D1 ext with trunk rotation 2x10 Blue TB B Standing D2 flex RTB with trunk rotation 2x10 RTB Standing cross lat pulldown one arm 10lb 2x10 B UE Standing rows with cable 20lb 2x10    MANUAL THERAPY: To promote normalized muscle tension and pain modulation utilizing connective tissue  massage and therapeutic massage.  IASTM with foam roller to B lumbar paraspinals  04/23/24 THERAPEUTIC EXERCISE: To improve strength, endurance, ROM, and flexibility.  Demonstration, verbal and tactile cues throughout for technique.  Rec Bike - L4 x 10 min Standing lumbar flexion stretch holding onto wall ladder 3 x 30 Standing lumbar flexion stretch holding onto wall ladder with slight lateral emphasis 2 x 30 bil Supine DKTC with heels on orange Pball 2 x 10 Supine LTR with lower legs on orange Pball x 10 Hooklying TrA + hip flexion march 3 x 10, last 2 sets with looped GTB at knees  MANUAL THERAPY: To promote normalized muscle tension and pain modulation utilizing connective tissue massage and therapeutic massage.  IASTM with foam roller to B lumbar paraspinals   04/09/24 THERAPEUTIC EXERCISE: To improve strength, endurance, and flexibility.  Demonstration, verbal and tactile cues throughout for technique.  Rec Bike - L4 x 10 min Seated 3-way roll outs w/ green pball x 30  forward and x 10 to each side   NEUROMUSCULAR RE-EDUCATION: To improve balance, coordination, kinesthesia, posture, and proprioception. Standing GTB 4-way SLR x 10 bil - mostly w/o UE support but intermittent light UE support on back of chair for hip extension and abduction Wall pushups focusing on core stability (avoiding hip hinge or sway back) 2 x 10  THERAPEUTIC ACTIVITIES: To improve functional performance.  Demonstration, verbal and tactile cues throughout for technique. Floor to hip height 10# kettle bell lift focusing on hip and knee flexion (squatting) while avoiding trunk flexion/extension x 10 Floor to chest height 10# kettle bell lift focusing on hip and knee flexion (squatting) while avoiding trunk flexion/extension - deferred d/t increased LBP  MANUAL THERAPY: To promote normalized muscle tension and pain modulation utilizing connective tissue massage and therapeutic massage.  IASTM with foam roller to B lumbar paraspinals   04/06/24 THERAPEUTIC EXERCISE: To improve strength, endurance, ROM, and flexibility Nustep L5x10 min Seated fwd roll outs w/ green pball x 30   THERAPEUTIC ACTIVITIES: To improve functional performance.  Demonstration, verbal and tactile cues throughout for technique.  Standing side bend with 10lb kettle bell x 15 B Diagonal bend and reach to elevated mat table 10lb each side 2x10 Chop with blue TB x 10 B NEUROMUSCULAR RE-EDUCATION: To improve kinesthesia, posture, and proprioception.  Seated on green pball:  4 way Weight Shifts x20 B  Trunk rotations x 10 B Marches x 15 B Seated LAQs x20 B  Standing lat pull down + march Blue TB 2x10   04/02/24 THERAPEUTIC EXERCISE: To improve strength, endurance, ROM, and flexibility Rec Bike L4x10 min Seated 3-way roll outs w/ green pball x10 ea way  Seated Chest Press 20# 2x15   THERAPEUTIC ACTIVITIES: To improve functional performance.  Demonstration, verbal and tactile cues throughout for technique.   Farmers Carry 2x5# kb, 2x10# kb (20')- cues to maintain posture  Suitcase carries 10# kb 4x20'- cues to be careful of leaning to weighted side   NEUROMUSCULAR RE-EDUCATION: To improve kinesthesia, posture, and proprioception.  STS 2x10- 5# kb at chest with cues for upright posture  Seated on green pball in corner:  Seated Weight Shifts x20 B  Seated Marches x20  Seated Marches with opposite arm raise x10- pt had difficulty with rhythm  Seated LAQs x10 B  Standing on airex pad in corner: ball tosses + catches- 2x10 (chest pass)  Standing on airex pad in corner: ball tosses + catches- 2x10 (overhead pass and catch) Standing Scap Squeeze +  Shoulder Row Blue TB 2x10  Standing Scap Squeeze + Shoulder Ext Blue TB x20    03/31/24 THERAPEUTIC EXERCISE: To improve strength, endurance, and ROM.  Demonstration, verbal and tactile cues throughout for technique. Rec Bike - L4 x 10 min Verbal HEP review Bridge x 10 Bridge + GTB hip ABD x 10 - pt reporting better awareness of glute activation Standing hip extension with slight fwd lean   THERAPEUTIC ACTIVITIES: To improve functional performance.  Demonstration, verbal and tactile cues throughout for technique. Lumbar ROM assessment LE MMT Goal assessment  SELF CARE: Provided education to increase independence with ADLs and to facilitate performance of basic household cleaning/chores.  Provided education in proper posture and body mechanics for typical daily positioning and household chores to minimize strain on low back and neck. Information provided on home TENS unit option (unit used in clinic during exercises).   03/23/24 THERAPEUTIC EXERCISE: To improve strength, endurance, and flexibility. Nustep L6x9min UE/LE  TENS unit utilized to B thoracolumbar PS during session for pain modulation: NEUROMUSCULAR RE-EDUCATION: To improve balance, posture, and proprioception. Standing TrA +  shoulder row Blue TB x 20 Standing TrA +  shoulder  extension  Blue TB x 20 Standing trunk rotation Blue TB x 10 B Standing pallof press Blue TB 2 x 10 B Clock balance from airex to floor 12 to 6 o'clock  Toe taps from airex to 8' step x 20 Ball toss and catch from airex Sit to stands x 15 Posture/body mechanics education   03/19/2024 THERAPEUTIC EXERCISE: To improve strength and endurance.   TM 2.5 mph x 10'  THERAPEUTIC ACTIVITIES: To improve functional performance.  Demonstration, verbal and tactile cues throughout for technique. Modified Oswestry: 19 / 50 = 38.0 %, moderate disability  NEUROMUSCULAR RE-EDUCATION: To improve balance, coordination, kinesthesia, posture, and proprioception. Standing TrA +  shoulder row GTB x 10, Blue TB x 10 Standing TrA +  shoulder extension GTB x 10, Blue TB x 10  MODALITIES: Auvon TENS unit with 1 channel on each thoracolumbar paraspinals, intensity to pt tolerance during therapeutic exercises  MANUAL THERAPY: To promote normalized muscle tension, improved flexibility, pain modulation, and reduced pain utilizing connective tissue massage and therapeutic massage.  IASTM with foam roller to R>L lumbar paraspinals   PATIENT EDUCATION:  Education details: continue with current HEP  Person educated: Patient Education method: Explanation Education comprehension: verbalized understanding  HOME EXERCISE PROGRAM: Access Code: [email or web address] URL: [email or web address] Date: 03/31/2024 Prepared by: Elijah Hidden  Exercises - Supine Hamstring Stretch with Strap  - 2 x daily - 7 x weekly - 3 reps - 30 sec hold - Supine Iliotibial Band Stretch with Strap  - 2 x daily - 7 x weekly - 3 reps - 30 sec hold - Standing Shoulder Row with Anchored Resistance  - 1 x daily - 7 x weekly - 3 sets - 10 reps - Shoulder extension with resistance - Neutral  - 1 x daily - 7 x weekly - 3 sets - 10 reps - Standing Anti-Rotation Press with Anchored Resistance  - 1 x daily - 7 x weekly - 3 sets - 10 reps -  Hooklying Clamshell with Resistance  - 1 x daily - 7 x weekly - 2 sets - 10 reps - 3 sec hold - Bridge with Hip Abduction and Resistance - Ground Touches  - 1 x daily - 3-4 x weekly - 2 sets - 10 reps - 3 sec hold - Prone Hip Extension on Table  -  1 x daily - 3-4 x weekly - 2 sets - 10 reps - 3 sec hold  Patient Education - Posture and Body Mechanics   ASSESSMENT:  CLINICAL IMPRESSION:  Interventions focusing on trunk mobility and strength, with stabilization/engaging core as well as MT for STM/IASTM to reduce muscle tightness and pain with pt noting some relief by end of session.  [place] will benefit from skilled PT to address ongoing ROM, flexibility and strength deficits to improve mobility and activity tolerance with decreased pain interference.   Eval: Erik Salazar is a 73 y.o. male who was referred to physical therapy for evaluation and treatment for acute on chronic LBP secondary to lumbar spondylolisthesis.  Patient reports onset of current midline low back pain beginning ~2 months ago without known MOI.  He is unable to isolate aggravating or relieving factors.  Patient has deficits in lumbar ROM, proximal LE flexibility, B LE strength, abnormal posture, and TTP with abnormal muscle tension  which are interfering with ADLs and are impacting quality of life.  On Modified Oswestry patient scored 25/50 demonstrating 50% or severe disability.  [place] will benefit from skilled PT to address above deficits to improve mobility and activity tolerance with decreased pain interference.  Attempt at initial HEP creation limited by increased pain.  PT referral included orders for TPDN - patient made aware of recent change in rehab policy requiring OOP cash pay for TPDN at time of service in order to remain in regulatory and legal compliance with billing.  OBJECTIVE IMPAIRMENTS: decreased activity tolerance, decreased endurance, decreased knowledge of condition, decreased mobility, difficulty walking,  decreased ROM, decreased strength, increased fascial restrictions, impaired perceived functional ability, increased muscle spasms, impaired flexibility, improper body mechanics, postural dysfunction, and pain.  ACTIVITY LIMITATIONS: carrying, lifting, bending, sitting, standing, squatting, sleeping, stairs, transfers, bed mobility, locomotion level, and caring for others   PARTICIPATION LIMITATIONS: meal prep, cleaning, laundry, driving, shopping, community activity, and yard work  PERSONAL FACTORS: Age, Fitness, Past/current experiences, Time since onset of injury/illness/exacerbation, and 3+ comorbidities: History chronic back pain, multiple back surgeries, ACDF, B TKA, RA and OA, DVT, CVA frontal lobe with memory deficits, seizures, open heart surgery to repair aneurism, CABG x 2, omental flap graft to mediastinum, hernia repair.  are also affecting patient's functional outcome.   REHAB POTENTIAL: Good  CLINICAL DECISION MAKING: Evolving/moderate complexity  EVALUATION COMPLEXITY: Moderate   GOALS: Goals reviewed with patient? Yes  SHORT TERM GOALS: Target date: 03/02/2024  Patient will be independent with initial HEP to improve outcomes and carryover.  Baseline: Partial initial HEP provided on eval but limited by pain Goal status: MET - 02/24/24   2.  Patient will report 25% improvement in low back pain to improve QOL. Baseline: 7/10 on eval, up to 10/10 at worst 02/24/24 - no significant change since eval 03/02/24 - pain increased since ESIs Goal status: MET - 03/06/24 - pt reports 25% improvement in LBP    LONG TERM GOALS: Target date: 03/30/2024, extended to 05/12/2024  Patient will be independent with ongoing/advanced HEP for self-management at home.  Baseline:  03/17/24 - met for current HEP, anticipate further updates Goal status: IN PROGRESS - 03/31/24 - HEP reviewed and updated  2.  Patient will report 50-75% improvement in low back pain to improve QOL.  Baseline: 7/10 on  eval, up to 10/10 at worst 03/17/24 - 10-15% improvement currently  03/31/24 - 20-25% improvement Goal status: IN PROGRESS - 04/23/24 - pain worse since latest round of nerve block  injections  3.  Patient to demonstrate ability to achieve and maintain good spinal alignment/posturing and body mechanics needed for daily activities. Baseline:  03/31/24 - reviewed posture and body mechanics for typical daily activities with handout provided Goal status: PARTIALLY MET - 04/09/24 - Pt denied any questions but cues still needed for proper lifting mechanics during visit  4.  Patient will demonstrate functional pain free lumbar ROM to perform ADLs.   Baseline: Refer to above lumbar ROM table Goal status: IN PROGRESS - 03/31/24 - Improving ROM with pain now only associated with extension > flexion and R side-bending and rotation  5.  Patient will demonstrate improved B LE strength to >/= 4+/5 for improved stability and ease of mobility. Baseline: Refer to above LE MMT table Goal status: PARTIALLY MET - 03/31/24 - continued weakness in B hip extension and some lateral motions  6. Patient will report </= 38% on Modified Oswestry (MCID = 12%) to demonstrate improved functional ability with decreased pain interference. Baseline: 25 / 50 = 50.0 % Goal status: MET - 03/19/24 - 19 / 50 = 38.0 %  7.  Patient will tolerate 20-30 min of sitting or standing w/o increased pain to allow for  improved mobility and activity tolerance. Baseline: Per modified Oswestry pain limits sitting >1/2-hour and standing >10 minutes Goal status: IN PROGRESS - 03/31/24 - Pt reports he is able to sit for 20-30 minutes before needing to change position due to pain   PLAN:  PT FREQUENCY: 2x/week  PT DURATION: 6-8 weeks  PLANNED INTERVENTIONS: 02835- PT Re-evaluation, 97750- Physical Performance Testing, 97110-Therapeutic exercises, 97530- Therapeutic activity, [id number]- Neuromuscular re-education, 97535- Self Care, 02859- Manual therapy,  [id number]- Gait training, [phone]- Aquatic Therapy, [phone]- Electrical stimulation (unattended), 97035- Ultrasound, 79439 (1-2 muscles), [id number] (3+ muscles)- Dry Needling, Patient/Family education, Balance training, Taping, Joint mobilization, Spinal mobilization, Cryotherapy, and Moist heat  PLAN FOR NEXT SESSION:  Incorporate more lumbar rotational and side bend motion gently; Progress core/lumbopelvic (emphasis on hip extension) and ankle strengthening; Continue machine weight training with core stabilization;  Ball stabilization exercises.  E-stim/TENS as benefit noted - provide education in self-setup for home unit PRN if pt purchases TENS unit.       Shantese Raven L Osiris Odriscoll, PTA 04/30/2024, 10:14 AM

## 2024-05-01 ENCOUNTER — Other Ambulatory Visit (HOSPITAL_BASED_OUTPATIENT_CLINIC_OR_DEPARTMENT_OTHER): Payer: Self-pay

## 2024-05-04 ENCOUNTER — Encounter: Admitting: Physical Therapy

## 2024-05-05 ENCOUNTER — Other Ambulatory Visit: Payer: Self-pay | Admitting: Neurosurgery

## 2024-05-05 DIAGNOSIS — M47816 Spondylosis without myelopathy or radiculopathy, lumbar region: Secondary | ICD-10-CM

## 2024-05-07 ENCOUNTER — Ambulatory Visit

## 2024-05-07 DIAGNOSIS — M5459 Other low back pain: Secondary | ICD-10-CM

## 2024-05-07 DIAGNOSIS — G8929 Other chronic pain: Secondary | ICD-10-CM

## 2024-05-07 DIAGNOSIS — M62830 Muscle spasm of back: Secondary | ICD-10-CM

## 2024-05-07 DIAGNOSIS — M6281 Muscle weakness (generalized): Secondary | ICD-10-CM

## 2024-05-07 DIAGNOSIS — R252 Cramp and spasm: Secondary | ICD-10-CM

## 2024-05-07 NOTE — Therapy (Addendum)
 OUTPATIENT PHYSICAL THERAPY TREATMENT / DISCHARGE SUMMARY     Patient Name: Erik Salazar MRN: [id number] DOB:October 28, 1950, 73 y.o., male Today's Date: 05/07/2024  END OF SESSION:  PT End of Session - 05/07/24 0931     Visit Number 18    Date for PT Re-Evaluation 05/12/24    Authorization Type [employer]    Progress Note Due on Visit 22    PT Start Time [phone]    PT Stop Time 0930    PT Time Calculation (min) 43 min    Activity Tolerance Patient tolerated treatment well    Behavior During Therapy [facility] for tasks assessed/performed                  Past Medical History:  Diagnosis Date   Allergy see attached info   Arthritis    RA and OA   Blood transfusion without reported diagnosis    with 2nd heart surgery   Carpal tunnel syndrome on right 07/08/2017   Cervical radiculopathy at C6 11/14/2017   Right   Complication of anesthesia    Coronary artery disease    Enlarged prostate    Fibromyalgia    H/O: knee surgery    15    Hayfever    WHEN YOUNG   History of blood clots    History of kidney stones    History of open heart surgery    ASCENDING AORTIC ANEURYSM   Ischemic stroke of frontal lobe (HCC) 03/14/2017   Bilateral; post-redo CT surgery   Pneumonia    PONV (postoperative nausea and vomiting)    only after CABG surgeries   Seizure disorder (HCC) 03/14/2017   Seizures (HCC)    last one 02/2021,updated 09/25/22   Status post knee surgery    DVT POST KNEE SURGERY   Stroke [facility])    Past Surgical History:  Procedure Laterality Date   ANTERIOR CERVICAL DECOMP/DISCECTOMY FUSION N/A 05/04/2020   Procedure: Anterior Cervical Decompression/Discectomy Fuion Cervical three-four, Cervical four-five, Cervical five-six;  Surgeon: Onetha Kuba, MD;  Location: [facility] OR;  Service: Neurosurgery;  Laterality: N/A;   BACK SURGERY     lower back 06/28/2021   BALLOON DILATION N/A 06/23/2019   Procedure: BALLOON DILATION;  Surgeon: Elicia Claw, MD;  Location: [facility]  ENDOSCOPY;  Service: Gastroenterology;  Laterality: N/A;   BENTALL PROCEDURE  01/04/2016   Bentall with 23 mm pericardial AVR; SVG-LAD, SVG-CX [facility], [place])   BICEPS TENDON REPAIR Right    BIOPSY  06/23/2019   Procedure: BIOPSY;  Surgeon: Elicia Claw, MD;  Location: [facility] ENDOSCOPY;  Service: Gastroenterology;;   CARDIAC SURGERY     ANUERSYM MAY 2017   Bentall procedure. Bioprosthetic aortic valve #23 mm bovine model #2700 TF ask, and 28 mm Gelweave woven vascular sinus of Valsalva graft   CARDIAC VALVE REPLACEMENT  02/2016, 02/2017   CARPAL TUNNEL RELEASE  08/2018   right hand    COLONOSCOPY WITH PROPOFOL  N/A 10/22/2022   Procedure: COLONOSCOPY WITH PROPOFOL ;  Surgeon: San Sandor GAILS, DO;  Location: [facility] ENDOSCOPY;  Service: Gastroenterology;  Laterality: N/A;   CORONARY ARTERY BYPASS GRAFT  01/04/2016   VG to LAD & VG to LCX   CORONARY ARTERY BYPASS GRAFT  03/13/2017   LIMA to LAD with steril abcess removal from dacron graft   CYSTOSCOPY/URETEROSCOPY/HOLMIUM LASER/STENT PLACEMENT Left 06/12/2022   Procedure: CYSTOSCOPY/URETEROSCOPY/HOLMIUM LASER/STENT PLACEMENT;  Surgeon: Carolee Sherwood JONETTA DOUGLAS, MD;  Location: [facility] ORS;  Service: Urology;  Laterality: Left;   ESOPHAGOGASTRODUODENOSCOPY  Had done dilatation done about 2 or 3 times before in [place]   ESOPHAGOGASTRODUODENOSCOPY (EGD) WITH PROPOFOL  N/A 06/23/2019   Procedure: ESOPHAGOGASTRODUODENOSCOPY (EGD) WITH PROPOFOL ;  Surgeon: Elicia Claw, MD;  Location: [facility] ENDOSCOPY;  Service: Gastroenterology;  Laterality: N/A;   FALSE ANEURYSM REPAIR  03/13/2017   redo sternotomy, sterile abscess removal from Dacron graft, omental flap around aorta, CABG: LIMA-LAD ([name], Dr. Zachary Remington)   GREATER OMENTAL FLAP CLOSURE  2018   Of pericardium 2018   INSERTION OF MESH  10/14/2020   Procedure: INSERTION OF MESH;  Surgeon: Sheldon Standing, MD;  Location: [facility] OR;  Service: General;;   JOINT REPLACEMENT  knee   knee surgeries     13  surgeries on knee done before knee replacement    KNEE SURGERY  1983   LAPAROSCOPIC LYSIS OF ADHESIONS N/A 05/19/2021   Procedure: LYSIS OF ADHESIONS;  Surgeon: Sheldon Standing, MD;  Location: [facility] OR;  Service: General;  Laterality: N/A;  GEN & LOCAL   LYSIS OF ADHESION N/A 10/14/2020   Procedure: LYSIS OF ADHESION;  Surgeon: Sheldon Standing, MD;  Location: [facility] OR;  Service: General;  Laterality: N/A;   open heart surgery     03-13-2017   REPLACEMENT TOTAL KNEE Left 2015   ROTATOR CUFF REPAIR Right    done with biceps tendon repair   SPINE SURGERY  upper neck, lower spine   TONSILLECTOMY     removed as a child.   TOTAL KNEE ARTHROPLASTY Right 01/18/2022   Procedure: RIGHT TOTAL KNEE ARTHROPLASTY;  Surgeon: Jerri Kay HERO, MD;  Location: [facility] OR;  Service: Orthopedics;  Laterality: Right;   VENTRAL HERNIA REPAIR N/A 10/14/2020   Procedure: LAPAROSCOPIC VENTRAL HERNIA REPAIR WITH TAP BLOCK BILATERAL;  Surgeon: Sheldon Standing, MD;  Location: [facility] OR;  Service: General;  Laterality: N/A;   VENTRAL HERNIA REPAIR N/A 05/19/2021   Procedure: LAPAROSCOPIC VENTRAL WALL HERNIA REPAIR;  Surgeon: Sheldon Standing, MD;  Location: [facility] OR;  Service: General;  Laterality: N/A;   Patient Active Problem List   Diagnosis Date Noted   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   Constipation 10/22/2022   Hypertrophied anal papilla 10/22/2022   Colon cancer screening 10/22/2022   Postop check 03/26/2022   Gait abnormality 01/29/2022   Status post total right knee replacement 01/18/2022   Coronary artery disease involving native coronary artery of native heart without angina pectoris 01/04/2022   Preop cardiovascular exam 01/04/2022   Primary osteoarthritis of right knee 01/04/2022   Acute right-sided low back pain without sciatica 12/25/2021   Aortic atherosclerosis (HCC) 09/04/2021   Spondylolisthesis at L4-L5 level 06/28/2021   S/P repair of ventral hernia 05/19/2021   S/P repair of recurrent  ventral hernia 05/19/2021   Benign prostatic hyperplasia without lower urinary tract symptoms 10/14/2020   Chronic pain 10/14/2020   ED (erectile dysfunction) of organic origin 10/14/2020   Esophageal dysphagia 10/14/2020   Gastroesophageal reflux disease 10/14/2020   History of aortic valve replacement with bioprosthetic valve 10/14/2020   Hx of aortic aneurysm repair 10/14/2020   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   Thoracic aortic aneurysm without rupture (HCC) 10/14/2020   Recurrent incisional hernia 10/14/2020   Spinal stenosis in cervical region 05/04/2020   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   Cervical radiculopathy at C6 11/14/2017   Carpal tunnel syndrome on right 07/08/2017   Tremor, essential 07/08/2017   Ischemic stroke of frontal lobe (HCC) 04/05/2017   Pain in right hand 04/05/2017   History of omental  flap graft to mediastinum 03/27/2017   Seizure (HCC) 03/14/2017   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/13/2017   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HCC) 03/12/2017   Essential hypertension 02/26/2017   Mixed hyperlipidemia 02/26/2017    PCP: Jason Leita Repine, FNP   REFERRING PROVIDER: Onetha Kuba, MD   REFERRING DIAG: [phone] (ICD-10-CM) - Spondylolisthesis, lumbar region   THERAPY DIAG:  Other low back pain  Muscle spasm of back  Muscle weakness (generalized)  Chronic midline low back pain with left-sided sciatica  Cramp and spasm  RATIONALE FOR EVALUATION AND TREATMENT: Rehabilitation  ONSET DATE: Acute on chronic x ~2 months  NEXT MD VISIT: ~05/04/2024   SUBJECTIVE:                                                                                                                                                                                                         SUBJECTIVE STATEMENT: Pt thought he was going to have a seizure earlier this morning but it didn't happen. He reports his doctor waiting on insurance approval for Myelogram for  spine  PAIN: Are you having pain? Yes: NPRS scale: 5/10   Pain location: Midline lumbosacral spine  Pain description: steady/constant aching or throbbing  Aggravating factors: prolonged sitting or standing, riding in the car  Relieving factors: changing positions, TENS during exercises, foam rolling to back   PERTINENT HISTORY:  History chronic back pain, multiple back surgeries, ACDF, B TKA, RA and OA, DVT, CVA frontal lobe with memory deficits, seizures, open heart surgery to repair aneurism, CABG x 2, omental flap graft to mediastinum, hernia repair.   PRECAUTIONS: None  RED FLAGS: None  WEIGHT BEARING RESTRICTIONS: No  FALLS:  Has patient fallen in last 6 months? No  LIVING ENVIRONMENT:  Lives with: lives with their spouse Lives in: House/apartment Stairs: No Has following equipment at home: Single point cane, Walker - 2 wheeled, and bed side commode  OCCUPATION: Retired  PLOF: Independent and Leisure: exercises frequently - walk treadmill    PATIENT GOALS: Take away the pain.   OBJECTIVE: (objective measures completed at initial evaluation unless otherwise dated)  DIAGNOSTIC FINDINGS:  01/26/24 - MR Lumbar Spine:  IMPRESSION: 1. Interval posterior lumbar interbody fusion and decompression at L5-S1. No central canal or foraminal stenosis. 2. At L2-3 there is a mild disc bulge. Mild bilateral facet arthropathy. Mild bilateral lateral recess stenosis. Moderate-severe left foraminal stenosis. Mild-moderate right foraminal stenosis. Mild central canal stenosis. 3. At L3-4 there is a moderate disc bulge. Mild bilateral facet arthropathy. Mild bilateral lateral recess stenosis and  mild central canal stenosis. Moderate bilateral foraminal stenosis. 4. At L4-5 there is a mild disc bulge. No significant stenosis. Moderate bilateral foraminal stenosis. 5. No acute osseous injury of the lumbar spine.  04/08/21 - MR Lumbar Spine:  IMPRESSION: No change is appreciated compared  to the study of last year.   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 Pseudo disc herniation. Bilateral foraminal stenosis could possibly affect either or both exiting L5 nerves.   L3-4: Mild multifactorial stenosis secondary to bulging of the disc and mild facet and ligamentous prominence. No visible neural compression.   Other levels show disc bulges and facet degeneration but no apparent neural compressive stenosis.  PATIENT SURVEYS:  Modified Oswestry 25 / 50 = 50.0 %, severe disability  03/19/24: 19 / 50 = 38.0 %, moderate disability  SCREENING FOR RED FLAGS: Bowel or bladder incontinence: No Spinal tumors: No Cauda equina syndrome: No Compression fracture: No Abdominal aneurysm: No  COGNITION:  Overall cognitive status: Impaired - STM   SENSATION: WFL Numbness in hands from prior CVA  POSTURE:  decreased lumbar lordosis  PALPATION: TTP midline lumbar spine and over prior surgical incision. Increased muscle tension in B lumbar paraspinals but denies TTP.  LUMBAR ROM:   Active  Eval - p! w/ all motions 02/27/24 03/31/24 04/30/24  Flexion Hands to knees Hands to ankles Hands to ankles Ankles- p!  Extension 60% limited 50% limited 25% limited 15% limit- p!  Right lateral flexion Hand to lateral knee Lateral knee Hand to mid calf - p! R midline Mid calf- mild p!  Left lateral flexion Hand to lateral knee Lateral knee Hand to mid calf Mid calf- mild p!  Right rotation 40% limited  25% limited - p! R midline   Left rotation 40% limited  15% limited   (Blank rows = not tested)  MUSCLE LENGTH: Hamstrings: mod tight R, mild tight L ITB: mod tight R, mild tight L Piriformis: mod/severe tight B Hip flexors: mod tight B Quads: mod tight B Heelcord:   LOWER EXTREMITY ROM:    Limited d/t h/o TKA and low back pain   LOWER EXTREMITY MMT:    MMT Right eval Left eval R 03/13/24 L 03/13/24 R 03/31/24 L 03/31/24 R 05/07/24 L 05/07/24  Hip flexion 4 4+ 4+ 4+ 4+ 4+  4+ 5  Hip extension 4- 4 4- 4- 4 4-    Hip abduction 4 4+ 4 4+ 4+ 4 5 4+  Hip adduction 4+ 4+   4+ 4+    Hip internal rotation 4- p! Lateral knee 4+ 4 4+ 4 p! lateral knee 4+ 4+ 4+  Hip external rotation 4- p! Lateral knee 4 4+ 4+ 4 p! lateral knee 4+ 4 4+  Knee flexion 4+ 5 5 5 5 5     Knee extension 4+ 5 4 5 5 5     Ankle dorsiflexion 3+ 3+ 4- 4- 4 4 4+ 4+  Ankle plantarflexion          Ankle inversion          Ankle eversion           (Blank rows = not tested)  LUMBAR SPECIAL TESTS:  Straight leg raise test: Positive   TODAY'S TREATMENT:  05/07/24 THERAPEUTIC EXERCISE: To improve strength, endurance, ROM, and flexibility.  Demonstration, verbal and tactile cues throughout for technique.  Rec Bike - L4 x 10 min Tested strength- see above table  NEUROMUSCULAR RE-EDUCATION: To improve balance, coordination, kinesthesia, posture,  and proprioception. Multifidus walkouts 5lb x 10 B Resisted gait black TB fwd 4x36ft; sidesteps 2x57ft each way    MANUAL THERAPY: To promote normalized muscle tension and pain modulation utilizing connective tissue massage and therapeutic massage.  IASTM with foam roller to B lumbar paraspinals  04/30/24 THERAPEUTIC EXERCISE: To improve strength, endurance, ROM, and flexibility.  Demonstration, verbal and tactile cues throughout for technique.  Rec Bike - L4 x 10 min NEUROMUSCULAR RE-EDUCATION: To improve balance, coordination, kinesthesia, posture, and proprioception. Standing D1 ext with trunk rotation 2x10 Blue TB B Standing D2 flex RTB with trunk rotation 2x10 RTB Standing cross lat pulldown one arm 10lb 2x10 B UE Standing rows with cable 20lb 2x10    MANUAL THERAPY: To promote normalized muscle tension and pain modulation utilizing connective tissue massage and therapeutic massage.  IASTM with foam roller to B lumbar paraspinals  04/23/24 THERAPEUTIC EXERCISE: To improve strength, endurance, ROM, and flexibility.  Demonstration, verbal and  tactile cues throughout for technique.  Rec Bike - L4 x 10 min Standing lumbar flexion stretch holding onto wall ladder 3 x 30 Standing lumbar flexion stretch holding onto wall ladder with slight lateral emphasis 2 x 30 bil Supine DKTC with heels on orange Pball 2 x 10 Supine LTR with lower legs on orange Pball x 10 Hooklying TrA + hip flexion march 3 x 10, last 2 sets with looped GTB at knees  MANUAL THERAPY: To promote normalized muscle tension and pain modulation utilizing connective tissue massage and therapeutic massage.  IASTM with foam roller to B lumbar paraspinals   04/09/24 THERAPEUTIC EXERCISE: To improve strength, endurance, and flexibility.  Demonstration, verbal and tactile cues throughout for technique.  Rec Bike - L4 x 10 min Seated 3-way roll outs w/ green pball x 30 forward and x 10 to each side   NEUROMUSCULAR RE-EDUCATION: To improve balance, coordination, kinesthesia, posture, and proprioception. Standing GTB 4-way SLR x 10 bil - mostly w/o UE support but intermittent light UE support on back of chair for hip extension and abduction Wall pushups focusing on core stability (avoiding hip hinge or sway back) 2 x 10  THERAPEUTIC ACTIVITIES: To improve functional performance.  Demonstration, verbal and tactile cues throughout for technique. Floor to hip height 10# kettle bell lift focusing on hip and knee flexion (squatting) while avoiding trunk flexion/extension x 10 Floor to chest height 10# kettle bell lift focusing on hip and knee flexion (squatting) while avoiding trunk flexion/extension - deferred d/t increased LBP  MANUAL THERAPY: To promote normalized muscle tension and pain modulation utilizing connective tissue massage and therapeutic massage.  IASTM with foam roller to B lumbar paraspinals   04/06/24 THERAPEUTIC EXERCISE: To improve strength, endurance, ROM, and flexibility Nustep L5x10 min Seated fwd roll outs w/ green pball x 30   THERAPEUTIC ACTIVITIES:  To improve functional performance.  Demonstration, verbal and tactile cues throughout for technique.  Standing side bend with 10lb kettle bell x 15 B Diagonal bend and reach to elevated mat table 10lb each side 2x10 Chop with blue TB x 10 B NEUROMUSCULAR RE-EDUCATION: To improve kinesthesia, posture, and proprioception.  Seated on green pball:  4 way Weight Shifts x20 B  Trunk rotations x 10 B Marches x 15 B Seated LAQs x20 B  Standing lat pull down + march Blue TB 2x10   04/02/24 THERAPEUTIC EXERCISE: To improve strength, endurance, ROM, and flexibility Rec Bike L4x10 min Seated 3-way roll outs w/ green pball x10 ea way  Seated Chest Press  20# 2x15   THERAPEUTIC ACTIVITIES: To improve functional performance.  Demonstration, verbal and tactile cues throughout for technique.  Farmers Carry 2x5# kb, 2x10# kb (20')- cues to maintain posture  Suitcase carries 10# kb 4x20'- cues to be careful of leaning to weighted side   NEUROMUSCULAR RE-EDUCATION: To improve kinesthesia, posture, and proprioception.  STS 2x10- 5# kb at chest with cues for upright posture  Seated on green pball in corner:  Seated Weight Shifts x20 B  Seated Marches x20  Seated Marches with opposite arm raise x10- pt had difficulty with rhythm  Seated LAQs x10 B  Standing on airex pad in corner: ball tosses + catches- 2x10 (chest pass)  Standing on airex pad in corner: ball tosses + catches- 2x10 (overhead pass and catch) Standing Scap Squeeze + Shoulder Row Blue TB 2x10  Standing Scap Squeeze + Shoulder Ext Blue TB x20    03/31/24 THERAPEUTIC EXERCISE: To improve strength, endurance, and ROM.  Demonstration, verbal and tactile cues throughout for technique. Rec Bike - L4 x 10 min Verbal HEP review Bridge x 10 Bridge + GTB hip ABD x 10 - pt reporting better awareness of glute activation Standing hip extension with slight fwd lean   THERAPEUTIC ACTIVITIES: To improve functional performance.  Demonstration, verbal  and tactile cues throughout for technique. Lumbar ROM assessment LE MMT Goal assessment  SELF CARE: Provided education to increase independence with ADLs and to facilitate performance of basic household cleaning/chores.  Provided education in proper posture and body mechanics for typical daily positioning and household chores to minimize strain on low back and neck. Information provided on home TENS unit option (unit used in clinic during exercises).   03/23/24 THERAPEUTIC EXERCISE: To improve strength, endurance, and flexibility. Nustep L6x28min UE/LE  TENS unit utilized to B thoracolumbar PS during session for pain modulation: NEUROMUSCULAR RE-EDUCATION: To improve balance, posture, and proprioception. Standing TrA +  shoulder row Blue TB x 20 Standing TrA +  shoulder extension  Blue TB x 20 Standing trunk rotation Blue TB x 10 B Standing pallof press Blue TB 2 x 10 B Clock balance from airex to floor 12 to 6 o'clock  Toe taps from airex to 8' step x 20 Ball toss and catch from airex Sit to stands x 15 Posture/body mechanics education   03/19/2024 THERAPEUTIC EXERCISE: To improve strength and endurance.   TM 2.5 mph x 10'  THERAPEUTIC ACTIVITIES: To improve functional performance.  Demonstration, verbal and tactile cues throughout for technique. Modified Oswestry: 19 / 50 = 38.0 %, moderate disability  NEUROMUSCULAR RE-EDUCATION: To improve balance, coordination, kinesthesia, posture, and proprioception. Standing TrA +  shoulder row GTB x 10, Blue TB x 10 Standing TrA +  shoulder extension GTB x 10, Blue TB x 10  MODALITIES: Auvon TENS unit with 1 channel on each thoracolumbar paraspinals, intensity to pt tolerance during therapeutic exercises  MANUAL THERAPY: To promote normalized muscle tension, improved flexibility, pain modulation, and reduced pain utilizing connective tissue massage and therapeutic massage.  IASTM with foam roller to R>L lumbar paraspinals   PATIENT  EDUCATION:  Education details: continue with current HEP  Person educated: Patient Education method: Explanation Education comprehension: verbalized understanding  HOME EXERCISE PROGRAM: Access Code: [email or web address] URL: [email or web address] Date: 03/31/2024 Prepared by: Elijah Hidden  Exercises - Supine Hamstring Stretch with Strap  - 2 x daily - 7 x weekly - 3 reps - 30 sec hold - Supine Iliotibial Band Stretch with Strap  - 2 x daily -  7 x weekly - 3 reps - 30 sec hold - Standing Shoulder Row with Anchored Resistance  - 1 x daily - 7 x weekly - 3 sets - 10 reps - Shoulder extension with resistance - Neutral  - 1 x daily - 7 x weekly - 3 sets - 10 reps - Standing Anti-Rotation Press with Anchored Resistance  - 1 x daily - 7 x weekly - 3 sets - 10 reps - Hooklying Clamshell with Resistance  - 1 x daily - 7 x weekly - 2 sets - 10 reps - 3 sec hold - Bridge with Hip Abduction and Resistance - Ground Touches  - 1 x daily - 3-4 x weekly - 2 sets - 10 reps - 3 sec hold - Prone Hip Extension on Table  - 1 x daily - 3-4 x weekly - 2 sets - 10 reps - 3 sec hold  Patient Education - Posture and Body Mechanics   ASSESSMENT:  CLINICAL IMPRESSION:  Added resisted gait motions for stabilization and functional strengthening. LE strength showing some progress with most weakness in RLE. Navi will benefit from skilled PT to address ongoing ROM, flexibility and strength deficits to improve mobility and activity tolerance with decreased pain interference.   Eval: Tyquan Heckart is a 73 y.o. male who was referred to physical therapy for evaluation and treatment for acute on chronic LBP secondary to lumbar spondylolisthesis.  Patient reports onset of current midline low back pain beginning ~2 months ago without known MOI.  He is unable to isolate aggravating or relieving factors.  Patient has deficits in lumbar ROM, proximal LE flexibility, B LE strength, abnormal posture, and TTP with abnormal  muscle tension  which are interfering with ADLs and are impacting quality of life.  On Modified Oswestry patient scored 25/50 demonstrating 50% or severe disability.  Earnie will benefit from skilled PT to address above deficits to improve mobility and activity tolerance with decreased pain interference.  Attempt at initial HEP creation limited by increased pain.  PT referral included orders for TPDN - patient made aware of recent change in rehab policy requiring OOP cash pay for TPDN at time of service in order to remain in regulatory and legal compliance with billing.  OBJECTIVE IMPAIRMENTS: decreased activity tolerance, decreased endurance, decreased knowledge of condition, decreased mobility, difficulty walking, decreased ROM, decreased strength, increased fascial restrictions, impaired perceived functional ability, increased muscle spasms, impaired flexibility, improper body mechanics, postural dysfunction, and pain.  ACTIVITY LIMITATIONS: carrying, lifting, bending, sitting, standing, squatting, sleeping, stairs, transfers, bed mobility, locomotion level, and caring for others   PARTICIPATION LIMITATIONS: meal prep, cleaning, laundry, driving, shopping, community activity, and yard work  PERSONAL FACTORS: Age, Fitness, Past/current experiences, Time since onset of injury/illness/exacerbation, and 3+ comorbidities: History chronic back pain, multiple back surgeries, ACDF, B TKA, RA and OA, DVT, CVA frontal lobe with memory deficits, seizures, open heart surgery to repair aneurism, CABG x 2, omental flap graft to mediastinum, hernia repair.  are also affecting patient's functional outcome.   REHAB POTENTIAL: Good  CLINICAL DECISION MAKING: Evolving/moderate complexity  EVALUATION COMPLEXITY: Moderate   GOALS: Goals reviewed with patient? Yes  SHORT TERM GOALS: Target date: 03/02/2024  Patient will be independent with initial HEP to improve outcomes and carryover.  Baseline: Partial initial  HEP provided on eval but limited by pain Goal status: MET - 02/24/24   2.  Patient will report 25% improvement in low back pain to improve QOL. Baseline: 7/10 on eval, up to 10/10  at worst 02/24/24 - no significant change since eval 03/02/24 - pain increased since ESIs Goal status: MET - 03/06/24 - pt reports 25% improvement in LBP    LONG TERM GOALS: Target date: 03/30/2024, extended to 05/12/2024  Patient will be independent with ongoing/advanced HEP for self-management at home.  Baseline:  03/17/24 - met for current HEP, anticipate further updates Goal status: IN PROGRESS - 03/31/24 - HEP reviewed and updated  2.  Patient will report 50-75% improvement in low back pain to improve QOL.  Baseline: 7/10 on eval, up to 10/10 at worst 03/17/24 - 10-15% improvement currently  03/31/24 - 20-25% improvement Goal status: IN PROGRESS - 04/23/24 - pain worse since latest round of nerve block injections  3.  Patient to demonstrate ability to achieve and maintain good spinal alignment/posturing and body mechanics needed for daily activities. Baseline:  03/31/24 - reviewed posture and body mechanics for typical daily activities with handout provided Goal status: PARTIALLY MET - 04/09/24 - Pt denied any questions but cues still needed for proper lifting mechanics during visit  4.  Patient will demonstrate functional pain free lumbar ROM to perform ADLs.   Baseline: Refer to above lumbar ROM table Goal status: IN PROGRESS - 03/31/24 - Improving ROM with pain now only associated with extension > flexion and R side-bending and rotation  5.  Patient will demonstrate improved B LE strength to >/= 4+/5 for improved stability and ease of mobility. Baseline: Refer to above LE MMT table Goal status: PARTIALLY MET - 03/31/24 - continued weakness in B hip extension and some lateral motions  6. Patient will report </= 38% on Modified Oswestry (MCID = 12%) to demonstrate improved functional ability with decreased pain  interference. Baseline: 25 / 50 = 50.0 % Goal status: MET - 03/19/24 - 19 / 50 = 38.0 %  7.  Patient will tolerate 20-30 min of sitting or standing w/o increased pain to allow for  improved mobility and activity tolerance. Baseline: Per modified Oswestry pain limits sitting >1/2-hour and standing >10 minutes Goal status: IN PROGRESS - 03/31/24 - Pt reports he is able to sit for 20-30 minutes before needing to change position due to pain   PLAN:  PT FREQUENCY: 2x/week  PT DURATION: 6-8 weeks  PLANNED INTERVENTIONS: 02835- PT Re-evaluation, 97750- Physical Performance Testing, 97110-Therapeutic exercises, 97530- Therapeutic activity, [id number]- Neuromuscular re-education, 97535- Self Care, 02859- Manual therapy, [id number]- Gait training, [phone]- Aquatic Therapy, [id number]- Electrical stimulation (unattended), 97035- Ultrasound, 79439 (1-2 muscles), 20561 (3+ muscles)- Dry Needling, Patient/Family education, Balance training, Taping, Joint mobilization, Spinal mobilization, Cryotherapy, and Moist heat  PLAN FOR NEXT SESSION:  Incorporate more lumbar rotational and side bend motion gently; Progress core/lumbopelvic (emphasis on hip extension) and ankle strengthening; Continue machine weight training with core stabilization;  Ball stabilization exercises.  E-stim/TENS as benefit noted - provide education in self-setup for home unit PRN if pt purchases TENS unit.       Asia Favata L Kaitlynn Tramontana, PTA 05/07/2024, 9:37 AM   PHYSICAL THERAPY DISCHARGE SUMMARY  Visits from Start of Care: 18  Current functional level related to goals / functional outcomes: Refer to above clinical impression and goal assessment for status as of last visit on 05/07/24. Patient cancelled his last scheduled visit due to inability to walk on his foot after a procedure and did not schedule any further visits with 30 days, therefore will proceed with discharge from PT for this episode.     Remaining deficits: As above. Unable to formally assess  status at discharge due to failure to return to PT.    Education / Equipment: Dietitian   Patient agrees to discharge. Patient goals were partially met. Patient is being discharged due to not returning since the last visit.  Elijah EMERSON Hidden, PT 07/28/2024, 10:54 AM  [facility] [address]  [address] [place], [place], [address] Phone: [phone]   Fax:  [phone]

## 2024-05-11 ENCOUNTER — Ambulatory Visit (INDEPENDENT_AMBULATORY_CARE_PROVIDER_SITE_OTHER): Admitting: Podiatry

## 2024-05-11 ENCOUNTER — Encounter: Payer: Self-pay | Admitting: Podiatry

## 2024-05-11 DIAGNOSIS — B070 Plantar wart: Secondary | ICD-10-CM | POA: Diagnosis not present

## 2024-05-11 NOTE — Progress Notes (Signed)
 Presents today follow-up plantar verruca right foot.  Doing much better no pain.  Has had 2 Salinocaine treatments.   Physical exam:  General appearance: Pleasant, and in no acute distress. AOx3.  Vascular: Pedal pulses: DP 2/4 bilaterally, PT 2/4 bilaterally. Mild edema lower legs bilaterally. Capillary fill time immediate b/l.  Neurological: Grossly intact bilaterally  Dermatologic:   Verrucous lesion resolved plantar right foot.  Skin normal temperature bilaterally.  Skin normal color, tone, and texture bilaterally.   Musculoskeletal:    Radiographs:   Diagnosis: 1.  Plantar verruca right foot  Plan: -Established office visit for evaluation and management level 3. - Verrucous lesion appears to be resolved.  Discussed with him things to look out for for signs of recurrence.  Explained that there is about a 70 to 75% recurrence rate with the plantar verruca.   Return as needed

## 2024-05-12 ENCOUNTER — Ambulatory Visit: Admitting: Physical Therapy

## 2024-05-25 ENCOUNTER — Other Ambulatory Visit

## 2024-06-02 ENCOUNTER — Telehealth: Payer: Self-pay | Admitting: Cardiology

## 2024-06-02 NOTE — Telephone Encounter (Signed)
 Spoke with Pt. Pt states Dr Jeffrie ordered an infusion at the infusion center and he needed to know if he needed Labwork before hand. Advised pt that I did not see anything about that but would be happy to get that over to Dr Jeffrie to review. Pt stated understanding.

## 2024-06-02 NOTE — Telephone Encounter (Signed)
 Pt is having infusion done at the Infusion Center and wants to know if he needs labs done first. Please advise

## 2024-06-08 NOTE — Telephone Encounter (Signed)
 Jeffrie Oneil BROCKS, MD to Radames Corean SAILOR, RN  Theotis Sharlet PARAS, RN     06/07/24  3:10 AM No blood work needed as of now  S/w the patient and gave the information above. He verbalized understanding.

## 2024-06-15 ENCOUNTER — Ambulatory Visit

## 2024-06-15 VITALS — BP 110/72 | HR 82 | Temp 97.7°F | Resp 18 | Ht 70.0 in | Wt 182.8 lb

## 2024-06-15 DIAGNOSIS — E782 Mixed hyperlipidemia: Secondary | ICD-10-CM | POA: Diagnosis not present

## 2024-06-15 DIAGNOSIS — I639 Cerebral infarction, unspecified: Secondary | ICD-10-CM

## 2024-06-15 DIAGNOSIS — I2510 Atherosclerotic heart disease of native coronary artery without angina pectoris: Secondary | ICD-10-CM | POA: Diagnosis not present

## 2024-06-15 MED ORDER — INCLISIRAN SODIUM 284 MG/1.5ML ~~LOC~~ SOSY
284.0000 mg | PREFILLED_SYRINGE | Freq: Once | SUBCUTANEOUS | Status: AC
  Administered 2024-06-15: 284 mg via SUBCUTANEOUS
  Filled 2024-06-15: qty 1.5

## 2024-06-15 NOTE — Progress Notes (Signed)
 Diagnosis: Hyperlipidemia  Provider:  Mannam, Praveen MD  Procedure: Injection  Leqvio  (inclisiran), Dose: 284 mg, Site: subcutaneous, Number of injections: 1  Injection Site(s): Left arm  Post Care:     Discharge: Condition: Good, Destination: Home . AVS Declined  Performed by:  Virtie Bungert, RN

## 2024-06-19 ENCOUNTER — Other Ambulatory Visit: Payer: Self-pay | Admitting: Gastroenterology

## 2024-06-22 ENCOUNTER — Encounter: Payer: Self-pay | Admitting: Radiology

## 2024-06-24 ENCOUNTER — Other Ambulatory Visit (HOSPITAL_BASED_OUTPATIENT_CLINIC_OR_DEPARTMENT_OTHER): Payer: Self-pay

## 2024-06-24 ENCOUNTER — Other Ambulatory Visit: Payer: Self-pay | Admitting: Gastroenterology

## 2024-06-24 MED ORDER — LUBIPROSTONE 8 MCG PO CAPS
8.0000 ug | ORAL_CAPSULE | Freq: Two times a day (BID) | ORAL | 0 refills | Status: DC
  Filled 2024-06-24: qty 30, 15d supply, fill #0

## 2024-06-26 ENCOUNTER — Other Ambulatory Visit: Payer: Self-pay

## 2024-06-26 ENCOUNTER — Other Ambulatory Visit (HOSPITAL_BASED_OUTPATIENT_CLINIC_OR_DEPARTMENT_OTHER): Payer: Self-pay

## 2024-06-29 ENCOUNTER — Other Ambulatory Visit: Payer: Self-pay

## 2024-07-29 ENCOUNTER — Ambulatory Visit: Admitting: Family

## 2024-07-29 ENCOUNTER — Other Ambulatory Visit (HOSPITAL_BASED_OUTPATIENT_CLINIC_OR_DEPARTMENT_OTHER): Payer: Self-pay

## 2024-07-29 ENCOUNTER — Encounter: Payer: Self-pay | Admitting: Family

## 2024-07-29 VITALS — BP 125/55 | HR 79 | Temp 98.2°F | Resp 16 | Ht 70.0 in | Wt 182.0 lb

## 2024-07-29 DIAGNOSIS — Z9189 Other specified personal risk factors, not elsewhere classified: Secondary | ICD-10-CM

## 2024-07-29 DIAGNOSIS — E782 Mixed hyperlipidemia: Secondary | ICD-10-CM

## 2024-07-29 DIAGNOSIS — N400 Enlarged prostate without lower urinary tract symptoms: Secondary | ICD-10-CM

## 2024-07-29 DIAGNOSIS — K589 Irritable bowel syndrome without diarrhea: Secondary | ICD-10-CM | POA: Insufficient documentation

## 2024-07-29 DIAGNOSIS — G8929 Other chronic pain: Secondary | ICD-10-CM

## 2024-07-29 DIAGNOSIS — N529 Male erectile dysfunction, unspecified: Secondary | ICD-10-CM

## 2024-07-29 DIAGNOSIS — I10 Essential (primary) hypertension: Secondary | ICD-10-CM

## 2024-07-29 DIAGNOSIS — I639 Cerebral infarction, unspecified: Secondary | ICD-10-CM

## 2024-07-29 DIAGNOSIS — G40909 Epilepsy, unspecified, not intractable, without status epilepticus: Secondary | ICD-10-CM

## 2024-07-29 DIAGNOSIS — Z8673 Personal history of transient ischemic attack (TIA), and cerebral infarction without residual deficits: Secondary | ICD-10-CM | POA: Insufficient documentation

## 2024-07-29 DIAGNOSIS — K581 Irritable bowel syndrome with constipation: Secondary | ICD-10-CM

## 2024-07-29 DIAGNOSIS — I2510 Atherosclerotic heart disease of native coronary artery without angina pectoris: Secondary | ICD-10-CM

## 2024-07-29 LAB — COMPREHENSIVE METABOLIC PANEL WITH GFR
ALT: 22 U/L (ref 0–53)
AST: 22 U/L (ref 0–37)
Albumin: 4.3 g/dL (ref 3.5–5.2)
Alkaline Phosphatase: 101 U/L (ref 39–117)
BUN: 15 mg/dL (ref 6–23)
CO2: 28 meq/L (ref 19–32)
Calcium: 9.5 mg/dL (ref 8.4–10.5)
Chloride: 98 meq/L (ref 96–112)
Creatinine, Ser: 1.06 mg/dL (ref 0.40–1.50)
GFR: 69.75 mL/min (ref >60.00)
Glucose, Bld: 93 mg/dL (ref 70–99)
Potassium: 4.5 meq/L (ref 3.5–5.1)
Sodium: 135 meq/L (ref 135–145)
Total Bilirubin: 1 mg/dL (ref 0.2–1.2)
Total Protein: 6.8 g/dL (ref 6.0–8.3)

## 2024-07-29 LAB — LIPID PANEL
Cholesterol: 102 mg/dL (ref 0–200)
HDL: 43.5 mg/dL (ref >39.00)
LDL Cholesterol: 33 mg/dL (ref 0–99)
NonHDL: 58.75
Total CHOL/HDL Ratio: 2
Triglycerides: 128 mg/dL (ref 0.0–149.0)
VLDL: 25.6 mg/dL (ref 0.0–40.0)

## 2024-07-29 MED ORDER — LUBIPROSTONE 8 MCG PO CAPS
8.0000 ug | ORAL_CAPSULE | Freq: Two times a day (BID) | ORAL | 0 refills | Status: AC
  Filled 2024-07-29: qty 180, 90d supply, fill #0

## 2024-07-29 NOTE — Assessment & Plan Note (Signed)
 Followed by Dr. Onita, [facility] neuro.

## 2024-07-29 NOTE — Progress Notes (Signed)
 Subjective:     Patient ID: Erik Salazar, male    DOB: 09-14-1950, 73 y.o.   MRN: [id number]  No chief complaint on file.   HPI  Discussed the use of AI scribe software for clinical note transcription with the patient, who gave verbal consent to proceed.  History of Present Illness Erik Salazar is a 73 year old male with a history of stroke and seizures who presents for a follow-up visit. He is accompanied by his wife.   He experienced a stroke in 2018 following open-heart surgery, which resulted in dysphagia and required him to relearn how to eat. Initially, he could speak but not eat. Since the stroke, he has developed seizures, which began approximately two years ago. He is currently under the care of a neurologist and is on Keppra  for seizure management. He mentions that he cannot drive for six months following a seizure.  He is on pravastatin  and receives Leqvio  injections every six months to manage his cholesterol levels, with his LDL currently at 47. He also takes a baby aspirin  for heart attack and stroke prevention. He has a history of open-heart surgery in 2017 and 2018, with aortic aneurysm repair and a pig valve replacement.  He has a history of low back pain, for which he sees Dr. Onetha. He underwent surgery with rod placement and fusion but still experiences pain. He has previously been referred to pain management but reports no improvement from injections.  He has an enlarged prostate and takes Flomax , which helps with urination, allowing him to wake only once at night.  He has a history of carpal tunnel syndrome, which is no longer an issue, and has had multiple surgeries on his knees, including replacements in 2016 and 2023. He also had a ventral hernia repair in 2022, which required a second surgery due to complications.  He takes metoprolol  XL for blood pressure, which is currently well-controlled at 125/55.  He is retired, previously worked as a emergency planning/management officer (and  also worked at [employer]), and has two daughters living in [place]  and [place] . He has one grandchild on the way. No current issues with carpal tunnel syndrome.  Has an aortic pig valve replacement.     Health Maintenance Due  Topic Date Due   Hepatitis C Screening  Never done   Zoster Vaccines- Shingrix (1 of 2) 04/28/1970    Past Medical History:  Diagnosis Date   Allergy see attached info   Arthritis    RA and OA   Blood transfusion without reported diagnosis    with 2nd heart surgery   Carpal tunnel syndrome on right 07/08/2017   Cervical radiculopathy at C6 11/14/2017   Right   Complication of anesthesia    Coronary artery disease    Enlarged prostate    Fibromyalgia    H/O: knee surgery    15    Hayfever    WHEN YOUNG   History of blood clots    History of kidney stones    History of open heart surgery    ASCENDING AORTIC ANEURYSM   Ischemic stroke of frontal lobe (HCC) 03/14/2017   Bilateral; post-redo CT surgery   Pneumonia    PONV (postoperative nausea and vomiting)    only after CABG surgeries   Seizure disorder (HCC) 03/14/2017   Seizures (HCC)    last one 02/2021,updated 09/25/22   Spinal stenosis in cervical region 05/04/2020   Status post knee surgery    DVT POST KNEE SURGERY  Stroke [facility])     Past Surgical History:  Procedure Laterality Date   ANTERIOR CERVICAL DECOMP/DISCECTOMY FUSION N/A 05/04/2020   Procedure: Anterior Cervical Decompression/Discectomy Fuion Cervical three-four, Cervical four-five, Cervical five-six;  Surgeon: Onetha Kuba, MD;  Location: [facility] OR;  Service: Neurosurgery;  Laterality: N/A;   BACK SURGERY     lower back 06/28/2021-fusion/hardware placement   BALLOON DILATION N/A 06/23/2019   Procedure: BALLOON DILATION;  Surgeon: Elicia Claw, MD;  Location: [facility];  Service: Gastroenterology;  Laterality: N/A;   BENTALL PROCEDURE  01/04/2016   Bentall with 23 mm pericardial AVR; SVG-LAD, SVG-CX  [facility] [place])   BICEPS TENDON REPAIR Right    BIOPSY  06/23/2019   Procedure: BIOPSY;  Surgeon: Elicia Claw, MD;  Location: [facility];  Service: Gastroenterology;;   CARDIAC SURGERY     ANUERSYM MAY 2017   Bentall procedure. Bioprosthetic aortic valve #23 mm bovine model #2700 TF ask, and 28 mm Gelweave woven vascular sinus of Valsalva graft   CARDIAC VALVE REPLACEMENT  02/2016, 02/2017   pig valve   CARPAL TUNNEL RELEASE Right 08/2018   right hand    COLONOSCOPY WITH PROPOFOL  N/A 10/22/2022   Procedure: COLONOSCOPY WITH PROPOFOL ;  Surgeon: San Sandor GAILS, DO;  [facility]: [facility];  Service: Gastroenterology;  Laterality: N/A;   CORONARY ARTERY BYPASS GRAFT  01/04/2016   VG to LAD & VG to LCX   CORONARY ARTERY BYPASS GRAFT  03/13/2017   LIMA to LAD with steril abcess removal from dacron graft   CYSTOSCOPY/URETEROSCOPY/HOLMIUM LASER/STENT PLACEMENT Left 06/12/2022   Procedure: CYSTOSCOPY/URETEROSCOPY/HOLMIUM LASER/STENT PLACEMENT;  Surgeon: Carolee Sherwood JONETTA DOUGLAS, MD;  Location: [facility];  Service: Urology;  Laterality: Left;   ESOPHAGOGASTRODUODENOSCOPY     Had done dilatation done about 2 or 3 times before in [place]   ESOPHAGOGASTRODUODENOSCOPY (EGD) WITH PROPOFOL  N/A 06/23/2019   Procedure: ESOPHAGOGASTRODUODENOSCOPY (EGD) WITH PROPOFOL ;  Surgeon: Elicia Claw, MD;  Location: [facility];  Service: Gastroenterology;  Laterality: N/A;   FALSE ANEURYSM REPAIR  03/13/2017   redo sternotomy, sterile abscess removal from Dacron graft, omental flap around aorta, CABG: LIMA-LAD ([name], Dr. Zachary Remington)   GREATER OMENTAL FLAP CLOSURE  2018   Of pericardium 2018   INSERTION OF MESH  10/14/2020   Procedure: INSERTION OF MESH;  Surgeon: Sheldon Standing, MD;  Location: [facility] OR;  Service: General;;   JOINT REPLACEMENT  knee   knee surgeries     13 surgeries on knee done before knee replacement    KNEE SURGERY  1983   LAPAROSCOPIC LYSIS OF ADHESIONS N/A 05/19/2021    Procedure: LYSIS OF ADHESIONS;  Surgeon: Sheldon Standing, MD;  Location: [facility] OR;  Service: General;  Laterality: N/A;  GEN & LOCAL   left total knee replacemet  2016   LYSIS OF ADHESION N/A 10/14/2020   Procedure: LYSIS OF ADHESION;  Surgeon: Sheldon Standing, MD;  Location: [facility] OR;  Service: General;  Laterality: N/A;   REPLACEMENT TOTAL KNEE Left 2015   ROTATOR CUFF REPAIR Right    done with biceps tendon repair   TONSILLECTOMY     removed as a child.   TOTAL KNEE ARTHROPLASTY Right 01/18/2022   Procedure: RIGHT TOTAL KNEE ARTHROPLASTY;  Surgeon: Jerri Kay HERO, MD;  Location: [facility] OR;  Service: Orthopedics;  Laterality: Right;   VENTRAL HERNIA REPAIR N/A 10/14/2020   Procedure: LAPAROSCOPIC VENTRAL HERNIA REPAIR WITH TAP BLOCK BILATERAL;  Surgeon: Sheldon Standing, MD;  Location: [facility];  Service: General;  Laterality: N/A;  VENTRAL HERNIA REPAIR N/A 05/19/2021   Procedure: LAPAROSCOPIC VENTRAL WALL HERNIA REPAIR;  Surgeon: Sheldon Standing, MD;  Location: [facility];  Service: General;  Laterality: N/A;    Family History  Problem Relation Age of Onset   Ulcerative colitis Mother    Arthritis Mother    Aneurysm Mother        brain aneurysm for mother.    Colitis Mother    Aneurysm Father    Colon cancer Neg Hx    Esophageal cancer Neg Hx    Colon polyps Neg Hx    Crohn's disease Neg Hx    Rectal cancer Neg Hx    Stomach cancer Neg Hx     Social History   Socioeconomic History   Marital status: Married    Spouse name: Not on file   Number of children: 3   Years of education: Not on file   Highest education level: Associate degree: academic program  Occupational History   Occupation: RETIRED  Tobacco Use   Smoking status: Former    Types: Cigars    Quit date: 2021    Years since quitting: 4.9   Smokeless tobacco: Never   Tobacco comments:    Only smoked cigars for 6 months  Vaping Use   Vaping status: Never Used  Substance and Sexual Activity   Alcohol use: Not Currently   Drug  use: No   Sexual activity: Yes    Partners: Female  Other Topics Concern   Not on file  Social History Narrative   Lives at home with wife, is retired emergency planning/management officer, originally fro [employer]   Education: 2 yrs college.     2 Children. One in [facility] and one in [place] - both daughters, awaiting first grandchild   Used to enjoy working out   Social Drivers of [employer]: Low Risk  (07/23/2024)   Overall Financial Resource Strain (CARDIA)    Difficulty of Paying Living Expenses: Not hard at all  Food Insecurity: No Food Insecurity (07/23/2024)   Hunger Vital Sign    Worried About Running Out of Food in the Last Year: Never true    Ran Out of Food in the Last Year: Never true  Transportation Needs: No Transportation Needs (07/23/2024)   PRAPARE - Administrator, Civil Service (Medical): No    Lack of Transportation (Non-Medical): No  Physical Activity: Insufficiently Active (07/23/2024)   Exercise Vital Sign    Days of Exercise per Week: 3 days    Minutes of Exercise per Session: 20 min  Stress: Stress Concern Present (07/23/2024)   [employer] of Occupational Health - Occupational Stress Questionnaire    Feeling of Stress: To some extent  Social Connections: Socially Integrated (07/23/2024)   Social Connection and Isolation Panel    Frequency of Communication with Friends and Family: Three times a week    Frequency of Social Gatherings with Friends and Family: Patient declined    Attends Religious Services: More than 4 times per year    Active Member of [employer] or Organizations: Yes    Attends Banker Meetings: 1 to 4 times per year    Marital Status: Married  Catering Manager Violence: Not At Risk (01/15/2024)   Humiliation, Afraid, Rape, and Kick questionnaire    Fear of Current or Ex-Partner: No    Emotionally Abused: No    Physically Abused: No    Sexually Abused: No    Outpatient Medications Prior to Visit  Medication Sig  Dispense Refill   acetaminophen  (TYLENOL ) 325 MG tablet Take 650 mg by mouth every 6 (six) hours as needed for moderate pain.     amoxicillin  (AMOXIL ) 500 MG capsule Take 4 capsules (2,000 mg total) by mouth 1 hour prior to dental appointment. 16 capsule 0   ascorbic acid  (VITAMIN C) 500 MG tablet Take 500 mg by mouth daily.     aspirin  EC 81 MG tablet Take 81 mg by mouth daily. Swallow whole.     inclisiran (LEQVIO ) 284 MG/1.5ML SOSY injection Inject 284 mg into the skin every 6 (six) months. 1.5 mL 0   levETIRAcetam  (KEPPRA ) 750 MG tablet Take 2 tablets (1,500 mg total) by mouth 2 (two) times daily. (Patient taking differently: Take 1,000 mg by mouth 2 (two) times daily.) 360 tablet 4   metoprolol  succinate (TOPROL -XL) 25 MG 24 hr tablet Take 1/2 tablet (12.5 mg total) by mouth daily. With or immediately following a meal. 45 tablet 3   Multiple Vitamins-Minerals (MULTIVITAMIN WITH MINERALS) tablet Take 1 tablet by mouth daily.     pravastatin  (PRAVACHOL ) 40 MG tablet Take 1 tablet (40 mg total) by mouth every evening. 90 tablet 3   sildenafil  (REVATIO ) 20 MG tablet Take 2-5 tablets (40-100 mg total) by mouth daily as needed 30 to 60 minutes before planned activity. 30 tablet 11   tamsulosin  (FLOMAX ) 0.4 MG CAPS capsule Take 2 capsules (0.8 mg total) by mouth daily. 180 capsule 3   lubiprostone  (AMITIZA ) 8 MCG capsule Take 1 capsule (8 mcg total) by mouth 2 (two) times daily with a meal. Patient needs follow up appointment for future refills. Please call [phone] to schedule an appointment. 30 capsule 0   No facility-administered medications prior to visit.    Allergies  Allergen Reactions   Oxycodone Hcl Other (See Comments)    DELUSIONAL   Zetia  [Ezetimibe ] Other (See Comments)    Leg cramps   Augmentin [Amoxicillin -Pot Clavulanate] Nausea Only    Dizzy   Chlorhexidine  Rash   Elemental Sulfur Rash   Oxytocin Other (See Comments)    Unknown reaction    Sulfur Rash    ROS See  HPI    Objective:    Physical Exam Constitutional:      General: He is not in acute distress.    Appearance: He is well-developed.  HENT:     Head: Normocephalic and atraumatic.  Cardiovascular:     Rate and Rhythm: Normal rate and regular rhythm.     Heart sounds: No murmur heard. Pulmonary:     Effort: Pulmonary effort is normal. No respiratory distress.     Breath sounds: Normal breath sounds. No wheezing or rales.  Musculoskeletal:        General: No swelling.  Skin:    General: Skin is warm and dry.  Neurological:     General: No focal deficit present.     Mental Status: He is alert and oriented to person, place, and time.  Psychiatric:        Behavior: Behavior normal.        Thought Content: Thought content normal.      BP (!) 125/55 (BP Location: Right Arm, Patient Position: Sitting, Cuff Size: Normal)   Pulse 79   Temp 98.2 F (36.8 C) (Oral)   Resp 16   Ht 5' 10 (1.778 m)   Wt 182 lb (82.6 kg)   SpO2 99%   BMI 26.11 kg/m  Wt Readings from Last 3 Encounters:  07/29/24 182 lb (82.6 kg)  06/15/24 182 lb 12.8 oz (82.9 kg)  04/15/24 183 lb (83 kg)       Assessment & Plan:   Problem List Items Addressed This Visit       Unprioritized   Seizure disorder (HCC)   Maintained on Keppra , management per neuro, Dr. Onita. He is not currently driving.       Mixed hyperlipidemia   Maintained on Leqvio /Pravastatin  for hyperlipidemia/secondary stroke prevention.  Lab Results  Component Value Date   CHOL 117 11/18/2023   HDL 43 11/18/2023   LDLCALC 47 11/18/2023   TRIG 163 (H) 11/18/2023   CHOLHDL 2.7 11/18/2023   LDL at goal.       Relevant Orders   Comp Met (CMET)   Lipid panel   Ischemic stroke of frontal lobe (HCC)   2022, no obvious neuro deficits on exam today.       IBS (irritable bowel syndrome)   Stable on amitiza .  Continue same.       Relevant Medications   lubiprostone  (AMITIZA ) 8 MCG capsule   History of stroke - Primary    Followed by Dr. Onita, [place] neuro.        Essential hypertension   BP Readings from Last 3 Encounters:  07/29/24 (!) 125/55  06/15/24 110/72  04/15/24 137/73   BP looks great on toprol  xl 12.5 mg daily.       ED (erectile dysfunction) of organic origin   Reports that he has not been sexually active following the onset of his seizures.      Coronary artery disease involving native coronary artery of native heart without angina pectoris   Follows with Dr. Oneil Parch[place].  Continue lipid management and aspirin  for secondary prevention.       Chronic low back pain   He follows with Dr. Onetha and previously saw pain management.        Benign prostatic hyperplasia without lower urinary tract symptoms   Clinically stable on flomax .       Other Visit Diagnoses       History of sun exposure, moderate       Relevant Orders   Ambulatory referral to Dermatology     I personally spent a total of 52 minutes in the care of the patient today including preparing to see the patient, performing a medically appropriate exam/evaluation, counseling and educating, placing orders, and coordinating care.   I am having Bekim Wandell maintain his ascorbic acid , Leqvio , aspirin  EC, multivitamin with minerals, acetaminophen , sildenafil , tamsulosin , amoxicillin , levETIRAcetam , pravastatin , metoprolol  succinate, and lubiprostone .  Meds ordered this encounter  Medications   lubiprostone  (AMITIZA ) 8 MCG capsule    Sig: Take 1 capsule (8 mcg total) by mouth 2 (two) times daily with a meal. Patient needs follow up appointment for future refills. Please call [phone] to schedule an appointment.    Dispense:  180 capsule    Refill:  0    Patient needs follow up appointment for future refills. Please call [phone] to schedule an appointment.    Supervising Provider:   DOMENICA BLACKBIRD A [phone]

## 2024-07-29 NOTE — Assessment & Plan Note (Signed)
 2022, no obvious neuro deficits on exam today.

## 2024-07-29 NOTE — Assessment & Plan Note (Signed)
 He follows with Dr. Onetha and previously saw pain management.

## 2024-07-29 NOTE — Assessment & Plan Note (Signed)
 Maintained on Keppra , management per neuro, Dr. Onita. He is not currently driving.

## 2024-07-29 NOTE — Assessment & Plan Note (Signed)
 Stable on amitiza .  Continue same.

## 2024-07-29 NOTE — Patient Instructions (Signed)
°  VISIT SUMMARY: Today, you came in for a follow-up visit. We reviewed your history of stroke, heart disease, cholesterol levels, low back pain, prostate health, and other ongoing health issues. You are currently managing your conditions with various medications and treatments, and we discussed some adjustments and follow-up plans.  YOUR PLAN: -HISTORY OF STROKE: You had a stroke in 2018, which caused difficulty swallowing and led to seizures. You are taking Keppra  to manage the seizures. Please continue taking Keppra  as prescribed.  -ATHEROSCLEROTIC HEART DISEASE: You have heart disease and have had surgeries including a coronary artery bypass and aortic valve replacement. Your cholesterol levels are well-managed with pravastatin  and Leqvio , and your blood pressure is controlled with metoprolol  XL. Please continue taking these medications as prescribed.  -MIXED HYPERLIPIDEMIA: This means you have high levels of different types of fats in your blood. Your LDL cholesterol is at a good level, but your triglycerides are slightly high. Please continue your current medications and try to make dietary changes to lower your triglycerides.  -CHRONIC LOW BACK PAIN: You have ongoing low back pain despite previous surgery and treatments. Please continue with your current pain management plan as needed.  -BENIGN PROSTATIC HYPERPLASIA: This is an enlarged prostate, which can cause urinary issues. You are taking Flomax , which helps with urination. Please continue taking Flomax  as prescribed.  -IRRITABLE BOWEL SYNDROME WITH CONSTIPATION: This condition affects your digestive system and causes constipation. You are taking Amitiza  to help manage this. Please continue taking Amitiza  as prescribed.  -ESSENTIAL HYPERTENSION: This is high blood pressure. Your blood pressure is well-controlled with metoprolol  XL. Please continue taking metoprolol  XL as prescribed.  -GENERAL HEALTH MAINTENANCE: We discussed some skin  lesions and have referred you to a dermatologist for further evaluation.  INSTRUCTIONS: Please follow up with the dermatologist for your skin lesions as discussed. Continue taking all your medications as prescribed and make the recommended dietary changes to help manage your triglyceride levels.

## 2024-07-29 NOTE — Assessment & Plan Note (Signed)
 Maintained on Leqvio /Pravastatin  for hyperlipidemia/secondary stroke prevention.  Lab Results  Component Value Date   CHOL 117 11/18/2023   HDL 43 11/18/2023   LDLCALC 47 11/18/2023   TRIG 163 (H) 11/18/2023   CHOLHDL 2.7 11/18/2023   LDL at goal.

## 2024-07-29 NOTE — Assessment & Plan Note (Signed)
 Follows with Dr. Oneil Parchment.  Continue lipid management and aspirin  for secondary prevention.

## 2024-07-29 NOTE — Assessment & Plan Note (Signed)
 Reports that he has not been sexually active following the onset of his seizures.

## 2024-07-29 NOTE — Assessment & Plan Note (Addendum)
 BP Readings from Last 3 Encounters:  07/29/24 (!) 125/55  06/15/24 110/72  04/15/24 137/73   BP looks great on toprol  xl 12.5 mg daily.

## 2024-07-29 NOTE — Assessment & Plan Note (Signed)
Clinically stable on flomax.

## 2024-07-30 ENCOUNTER — Ambulatory Visit: Payer: Self-pay | Admitting: Family

## 2024-08-10 ENCOUNTER — Other Ambulatory Visit (HOSPITAL_BASED_OUTPATIENT_CLINIC_OR_DEPARTMENT_OTHER): Payer: Self-pay

## 2024-08-11 ENCOUNTER — Ambulatory Visit: Admitting: Neurology
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
## 2024-08-23 ENCOUNTER — Emergency Department (HOSPITAL_BASED_OUTPATIENT_CLINIC_OR_DEPARTMENT_OTHER)

## 2024-08-23 ENCOUNTER — Encounter (HOSPITAL_BASED_OUTPATIENT_CLINIC_OR_DEPARTMENT_OTHER): Payer: Self-pay

## 2024-08-23 ENCOUNTER — Other Ambulatory Visit: Payer: Self-pay

## 2024-08-23 ENCOUNTER — Emergency Department (HOSPITAL_BASED_OUTPATIENT_CLINIC_OR_DEPARTMENT_OTHER)
Admission: EM | Admit: 2024-08-23 | Discharge: 2024-08-23 | Disposition: A | Discharge status: Home / Self Care | Attending: Emergency Medicine | Admitting: Emergency Medicine

## 2024-08-23 DIAGNOSIS — Z8673 Personal history of transient ischemic attack (TIA), and cerebral infarction without residual deficits: Secondary | ICD-10-CM | POA: Insufficient documentation

## 2024-08-23 DIAGNOSIS — I2510 Atherosclerotic heart disease of native coronary artery without angina pectoris: Secondary | ICD-10-CM | POA: Insufficient documentation

## 2024-08-23 DIAGNOSIS — Z79899 Other long term (current) drug therapy: Secondary | ICD-10-CM | POA: Insufficient documentation

## 2024-08-23 DIAGNOSIS — K449 Diaphragmatic hernia without obstruction or gangrene: Secondary | ICD-10-CM | POA: Insufficient documentation

## 2024-08-23 DIAGNOSIS — R0789 Other chest pain: Secondary | ICD-10-CM | POA: Insufficient documentation

## 2024-08-23 DIAGNOSIS — Z87891 Personal history of nicotine dependence: Secondary | ICD-10-CM | POA: Insufficient documentation

## 2024-08-23 DIAGNOSIS — Z953 Presence of xenogenic heart valve: Secondary | ICD-10-CM | POA: Insufficient documentation

## 2024-08-23 DIAGNOSIS — Z7982 Long term (current) use of aspirin: Secondary | ICD-10-CM | POA: Insufficient documentation

## 2024-08-23 DIAGNOSIS — I10 Essential (primary) hypertension: Secondary | ICD-10-CM | POA: Insufficient documentation

## 2024-08-23 DIAGNOSIS — R079 Chest pain, unspecified: Secondary | ICD-10-CM

## 2024-08-23 DIAGNOSIS — Z951 Presence of aortocoronary bypass graft: Secondary | ICD-10-CM | POA: Insufficient documentation

## 2024-08-23 DIAGNOSIS — Z955 Presence of coronary angioplasty implant and graft: Secondary | ICD-10-CM | POA: Insufficient documentation

## 2024-08-23 DIAGNOSIS — Z96651 Presence of right artificial knee joint: Secondary | ICD-10-CM | POA: Insufficient documentation

## 2024-08-23 LAB — RESP PANEL BY RT-PCR (RSV, FLU A&B, COVID)  RVPGX2
Influenza A by PCR: NEGATIVE
Influenza B by PCR: NEGATIVE
Resp Syncytial Virus by PCR: NEGATIVE
SARS Coronavirus 2 by RT PCR: NEGATIVE

## 2024-08-23 LAB — HEPATIC FUNCTION PANEL
ALT: 21 U/L (ref 0–44)
AST: 24 U/L (ref 15–41)
Albumin: 4.5 g/dL (ref 3.5–5.0)
Alkaline Phosphatase: 109 U/L (ref 38–126)
Bilirubin, Direct: 0.4 mg/dL — ABNORMAL HIGH (ref 0.0–0.2)
Indirect Bilirubin: 0.6 mg/dL (ref 0.3–0.9)
Total Bilirubin: 1 mg/dL (ref 0.0–1.2)
Total Protein: 7.6 g/dL (ref 6.5–8.1)

## 2024-08-23 LAB — LIPASE, BLOOD: Lipase: 19 U/L (ref 11–51)

## 2024-08-23 LAB — CBC
HCT: 48.6 % (ref 39.0–52.0)
Hemoglobin: 16.1 g/dL (ref 13.0–17.0)
MCH: 28.1 pg (ref 26.0–34.0)
MCHC: 33.1 g/dL (ref 30.0–36.0)
MCV: 84.8 fL (ref 80.0–100.0)
Platelets: 265 K/uL (ref 150–400)
RBC: 5.73 MIL/uL (ref 4.22–5.81)
RDW: 13.3 % (ref 11.5–15.5)
WBC: 10.5 K/uL (ref 4.0–10.5)
nRBC: 0 % (ref 0.0–0.2)

## 2024-08-23 LAB — TROPONIN T, HIGH SENSITIVITY
Troponin T High Sensitivity: <15 ng/L (ref 0–19)
Troponin T High Sensitivity: <15 ng/L (ref 0–19)

## 2024-08-23 LAB — BASIC METABOLIC PANEL WITH GFR
Anion gap: 14 (ref 5–15)
BUN: 12 mg/dL (ref 8–23)
CO2: 24 mmol/L (ref 22–32)
Calcium: 9.3 mg/dL (ref 8.9–10.3)
Chloride: 98 mmol/L (ref 98–111)
Creatinine, Ser: 1.11 mg/dL (ref 0.61–1.24)
GFR, Estimated: >60 mL/min
Glucose, Bld: 105 mg/dL — ABNORMAL HIGH (ref 70–99)
Potassium: 4.3 mmol/L (ref 3.5–5.1)
Sodium: 136 mmol/L (ref 135–145)

## 2024-08-23 MED ORDER — SODIUM CHLORIDE 0.9 % IV BOLUS
500.0000 mL | Freq: Once | INTRAVENOUS | Status: AC
  Administered 2024-08-23: 500 mL via INTRAVENOUS

## 2024-08-23 MED ORDER — IOHEXOL 350 MG/ML SOLN
75.0000 mL | Freq: Once | INTRAVENOUS | Status: AC | PRN
  Administered 2024-08-23: 75 mL via INTRAVENOUS

## 2024-08-23 MED ORDER — ALUM & MAG HYDROXIDE-SIMETH 200-200-20 MG/5ML PO SUSP
30.0000 mL | Freq: Once | ORAL | Status: AC
  Administered 2024-08-23: 30 mL via ORAL
  Filled 2024-08-23: qty 30

## 2024-08-23 NOTE — Discharge Instructions (Signed)
 While you were in the emergency room, you had blood work done that was normal.  Like we discussed with your history, it is always important to have this checked out when you experience the symptoms.  Please call your heart doctor this week for follow-up appointment.  Return to the emergency room for any new or worsening symptoms.

## 2024-08-23 NOTE — ED Provider Notes (Signed)
" °  Physical Exam  BP (!) 141/66   Pulse 74   Temp (!) 97.5 F (36.4 C) (Oral)   Resp 19   SpO2 96%   Physical Exam  Procedures  Procedures  ED Course / MDM   Clinical Course as of 08/23/24 1658  Sun Aug 23, 2024  1435 CT stable [SG]    Clinical Course User Index [SG] Elnor Jayson LABOR, DO   Medical Decision Making Amount and/or Complexity of Data Reviewed Labs: ordered. Radiology: ordered.  Risk OTC drugs. Prescription drug management.   I, Larnell Gravely, assumed care for this patient.  In brief 74 year old male history of bypass surgery here today for chest pain.  He reports he did similar episode last week, followed with his cardiothoracic surgeon.  Patient second troponin also negative.  I discussed this with the patient.  He is continue to have some residual discomfort.  I offered admission to this patient given his cardiac risk factors, however given his negative troponins as well as the symptoms occurring last week been abided by his cardiothoracic surgeon he felt safe for discharge.  Advised return precaution to patient patient's wife at bedside.  They are going to follow-up with their surgeon this week.       Gravely Pac T, DO 08/23/24 1701  "

## 2024-08-23 NOTE — ED Provider Notes (Signed)
 " [place] [facility] Provider Note  CSN: [id number] Arrival date & time: 08/23/24 1237  Chief Complaint(s) Chest Pain  HPI Erik Salazar is a 74 y.o. male with past medical history as below, significant for ascending aortic aneurysm, ischemic stroke, seizure disorder, CABG, ventral hernia,, hypertension, seizure disorder who presents to the ED with complaint of chest pain  He reports chest pain over the past 2 days, seems to be becoming more constant initially is intermittent.  Worsen when he tries to lay down flat especially at bedtime and improves with sitting upright.  Pain feels like a pressure or tightness sensation.  Feels similar to when he had his prior open heart surgery.  Nausea no vomiting.  No change in bowel or bladder function.  No significant changes appetite.  No fevers or chills.  No significant dyspnea.  No recent travel or sick contacts.  Reports he is compliant with his typical medications.  Reports prior esophageal stricture status post dilation  Past Medical History Past Medical History:  Diagnosis Date   Allergy see attached info   Arthritis    RA and OA   Blood transfusion without reported diagnosis    with 2nd heart surgery   Carpal tunnel syndrome on right 07/08/2017   Cervical radiculopathy at C6 11/14/2017   Right   Complication of anesthesia    Coronary artery disease    Enlarged prostate    Fibromyalgia    H/O: knee surgery    15    Hayfever    WHEN YOUNG   History of blood clots    History of kidney stones    History of open heart surgery    ASCENDING AORTIC ANEURYSM   Ischemic stroke of frontal lobe (HCC) 03/14/2017   Bilateral; post-redo CT surgery   Pneumonia    PONV (postoperative nausea and vomiting)    only after CABG surgeries   Seizure disorder (HCC) 03/14/2017   Seizures (HCC)    last one 02/2021,updated 09/25/22   Spinal stenosis in cervical region 05/04/2020   Status post knee surgery    DVT  POST KNEE SURGERY   Stroke [facility])    Patient Active Problem List   Diagnosis Date Noted   History of stroke 07/29/2024   IBS (irritable bowel syndrome) 07/29/2024   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   Hypertrophied anal papilla 10/22/2022   Coronary artery disease involving native coronary artery of native heart without angina pectoris 01/04/2022   Chronic low back pain 12/25/2021   Aortic atherosclerosis 09/04/2021   Spondylolisthesis at L4-L5 level 06/28/2021   S/P repair of ventral hernia 05/19/2021   S/P repair of recurrent ventral hernia 05/19/2021   Benign prostatic hyperplasia without lower urinary tract symptoms 10/14/2020   Chronic pain 10/14/2020   ED (erectile dysfunction) of organic origin 10/14/2020   Esophageal dysphagia 10/14/2020   Gastroesophageal reflux disease 10/14/2020   History of aortic valve replacement with bioprosthetic valve 10/14/2020   Hx of aortic aneurysm repair 10/14/2020   Pseudoaneurysm of aorta 10/14/2020   Thoracic aortic aneurysm without rupture 10/14/2020   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   Cervical radiculopathy at C6 11/14/2017   Tremor, essential 07/08/2017   Ischemic stroke of frontal lobe (HCC) 04/05/2017   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 03/27/2017   Seizure disorder (HCC) 03/14/2017   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/13/2017   Bypass graft stenosis 03/12/2017   Essential hypertension 02/26/2017   Mixed hyperlipidemia 02/26/2017  Home Medication(s) Prior to Admission medications  Medication Sig Start Date End Date Taking? Authorizing Provider  acetaminophen  (TYLENOL ) 325 MG tablet Take 650 mg by mouth every 6 (six) hours as needed for moderate pain.    [provider]  amoxicillin  (AMOXIL ) 500 MG capsule Take 4 capsules (2,000 mg total) by mouth 1 hour prior to dental appointment. 12/10/23     ascorbic acid  (VITAMIN C) 500 MG tablet Take 500 mg by mouth daily.    [provider]  aspirin  EC 81 MG tablet Take 81 mg by mouth daily. Swallow whole.    [provider]  inclisiran (LEQVIO ) 284 MG/1.5ML SOSY injection Inject 284 mg into the skin every 6 (six) months. 02/13/22   Jeffrie Oneil BROCKS, MD  levETIRAcetam  (KEPPRA ) 750 MG tablet Take 2 tablets (1,500 mg total) by mouth 2 (two) times daily. Patient taking differently: Take 1,000 mg by mouth 2 (two) times daily. 12/31/23   Onita Duos, MD  lubiprostone  (AMITIZA ) 8 MCG capsule Take 1 capsule (8 mcg total) by mouth 2 (two) times daily with a meal. Patient needs follow up appointment for future refills. Please call [phone] to schedule an appointment. 07/29/24   O'Sullivan, Melissa, NP  metoprolol  succinate (TOPROL -XL) 25 MG 24 hr tablet Take 1/2 tablet (12.5 mg total) by mouth daily. With or immediately following a meal. 04/23/24   Jeffrie Oneil BROCKS, MD  Multiple Vitamins-Minerals (MULTIVITAMIN WITH MINERALS) tablet Take 1 tablet by mouth daily.    [provider]  pravastatin  (PRAVACHOL ) 40 MG tablet Take 1 tablet (40 mg total) by mouth every evening. 03/31/24   Jeffrie Oneil BROCKS, MD  sildenafil  (REVATIO ) 20 MG tablet Take 2-5 tablets (40-100 mg total) by mouth daily as needed 30 to 60 minutes before planned activity. 10/29/23     tamsulosin  (FLOMAX ) 0.4 MG CAPS capsule Take 2 capsules (0.8 mg total) by mouth daily. 10/29/23                                                                                                                                       Past Surgical History Past Surgical History:  Procedure Laterality Date   ANTERIOR CERVICAL DECOMP/DISCECTOMY FUSION N/A 05/04/2020   Procedure: Anterior Cervical Decompression/Discectomy Fuion Cervical three-four, Cervical four-five, Cervical five-six;  Surgeon: Onetha Kuba, MD;  Location: [facility] OR;  Service: Neurosurgery;  Laterality: N/A;   BACK SURGERY     lower back 06/28/2021-fusion/hardware placement   BALLOON DILATION N/A 06/23/2019   Procedure:  BALLOON DILATION;  Surgeon: Elicia Claw, MD;  Location: [facility] ENDOSCOPY;  Service: Gastroenterology;  Laterality: N/A;   BENTALL PROCEDURE  01/04/2016   Bentall with 23 mm pericardial AVR; SVG-LAD, SVG-CX [facility] [place]   BICEPS TENDON REPAIR Right    BIOPSY  06/23/2019   Procedure: BIOPSY;  Surgeon: Elicia Claw, MD;  Location: [facility]OS[facility];  Service: Gastroenterology;;   CARDIAC SURGERY  ANUERSYM MAY 2017   Bentall procedure. Bioprosthetic aortic valve #23 mm bovine model #2700 TF ask, and 28 mm Gelweave woven vascular sinus of Valsalva graft   CARDIAC VALVE REPLACEMENT  02/2016, 02/2017   pig valve   CARPAL TUNNEL RELEASE Right 08/2018   right hand    COLONOSCOPY WITH PROPOFOL  N/A 10/22/2022   Procedure: COLONOSCOPY WITH PROPOFOL ;  Surgeon: San Sandor GAILS, DO;  Location: [facility];  Service: Gastroenterology;  Laterality: N/A;   CORONARY ARTERY BYPASS GRAFT  01/04/2016   VG to LAD & VG to LCX   CORONARY ARTERY BYPASS GRAFT  03/13/2017   LIMA to LAD with steril abcess removal from dacron graft   CYSTOSCOPY/URETEROSCOPY/HOLMIUM LASER/STENT PLACEMENT Left 06/12/2022   Procedure: CYSTOSCOPY/URETEROSCOPY/HOLMIUM LASER/STENT PLACEMENT;  Surgeon: Carolee Sherwood JONETTA DOUGLAS, MD;  Location: [facility] ORS;  Service: Urology;  Laterality: Left;   ESOPHAGOGASTRODUODENOSCOPY     Had done dilatation done about 2 or 3 times before in [place]   ESOPHAGOGASTRODUODENOSCOPY (EGD) WITH PROPOFOL  N/A 06/23/2019   Procedure: ESOPHAGOGASTRODUODENOSCOPY (EGD) WITH PROPOFOL ;  Surgeon: Elicia Claw, MD;  Location: [facility];  Service: Gastroenterology;  Laterality: N/A;   FALSE ANEURYSM REPAIR  03/13/2017   redo sternotomy, sterile abscess removal from Dacron graft, omental flap around aorta, CABG: LIMA-LAD ([name], Dr. Zachary Remington)   GREATER OMENTAL FLAP CLOSURE  2018   Of pericardium 2018   INSERTION OF MESH  10/14/2020   Procedure: INSERTION OF MESH;  Surgeon: Sheldon Standing, MD;   Location: [facility] OR;  Service: General;;   JOINT REPLACEMENT  knee   knee surgeries     13 surgeries on knee done before knee replacement    KNEE SURGERY  1983   LAPAROSCOPIC LYSIS OF ADHESIONS N/A 05/19/2021   Procedure: LYSIS OF ADHESIONS;  Surgeon: Sheldon Standing, MD;  Location: [facility] OR;  Service: General;  Laterality: N/A;  GEN & LOCAL   left total knee replacemet  2016   LYSIS OF ADHESION N/A 10/14/2020   Procedure: LYSIS OF ADHESION;  Surgeon: Sheldon Standing, MD;  Location: [facility] OR;  Service: General;  Laterality: N/A;   REPLACEMENT TOTAL KNEE Left 2015   ROTATOR CUFF REPAIR Right    done with biceps tendon repair   TONSILLECTOMY     removed as a child.   TOTAL KNEE ARTHROPLASTY Right 01/18/2022   Procedure: RIGHT TOTAL KNEE ARTHROPLASTY;  Surgeon: Jerri Kay HERO, MD;  Location: [facility] OR;  Service: Orthopedics;  Laterality: Right;   VENTRAL HERNIA REPAIR N/A 10/14/2020   Procedure: LAPAROSCOPIC VENTRAL HERNIA REPAIR WITH TAP BLOCK BILATERAL;  Surgeon: Sheldon Standing, MD;  Location: [facility] OR;  Service: General;  Laterality: N/A;   VENTRAL HERNIA REPAIR N/A 05/19/2021   Procedure: LAPAROSCOPIC VENTRAL WALL HERNIA REPAIR;  Surgeon: Sheldon Standing, MD;  Location: [facility] OR;  Service: General;  Laterality: N/A;   Family History Family History  Problem Relation Age of Onset   Ulcerative colitis Mother    Arthritis Mother    Aneurysm Mother        brain aneurysm for mother.    Colitis Mother    Aneurysm Father    Colon cancer Neg Hx    Esophageal cancer Neg Hx    Colon polyps Neg Hx    Crohn's disease Neg Hx    Rectal cancer Neg Hx    Stomach cancer Neg Hx     Social History Social History[1] Allergies Oxycodone hcl, Zetia  [ezetimibe ], Augmentin [amoxicillin -pot clavulanate], Chlorhexidine , Elemental sulfur, Oxytocin, and Sulfur  Review of  Systems A thorough review of systems was obtained and all systems are negative except as noted in the HPI and PMH.   Physical Exam Vital Signs  I have  reviewed the triage vital signs BP (!) 136/58   Pulse 97   Temp (!) 97.5 F (36.4 C) (Oral)   Resp 16   SpO2 99%  Physical Exam Vitals and nursing note reviewed.  Constitutional:      General: He is not in acute distress.    Appearance: He is well-developed.  HENT:     Head: Normocephalic and atraumatic.     Right Ear: External ear normal.     Left Ear: External ear normal.     Mouth/Throat:     Mouth: Mucous membranes are moist.  Eyes:     General: No scleral icterus. Cardiovascular:     Rate and Rhythm: Normal rate and regular rhythm.     Pulses: Normal pulses.     Heart sounds: Normal heart sounds.  Pulmonary:     Effort: Pulmonary effort is normal. No respiratory distress.     Breath sounds: Normal breath sounds.  Abdominal:     General: Abdomen is flat.     Palpations: Abdomen is soft.     Tenderness: There is no abdominal tenderness.  Musculoskeletal:     Cervical back: No rigidity.     Right lower leg: No edema.     Left lower leg: No edema.  Skin:    General: Skin is warm and dry.     Capillary Refill: Capillary refill takes less than 2 seconds.  Neurological:     Mental Status: He is alert.  Psychiatric:        Mood and Affect: Mood normal.        Behavior: Behavior normal.     ED Results and Treatments Labs (all labs ordered are listed, but only abnormal results are displayed) Labs Reviewed  BASIC METABOLIC PANEL WITH GFR - Abnormal; Notable for the following components:      Result Value   Glucose, Bld 105 (*)    All other components within normal limits  RESP PANEL BY RT-PCR (RSV, FLU A&B, COVID)  RVPGX2  CBC  HEPATIC FUNCTION PANEL  LIPASE, BLOOD  TROPONIN T, HIGH SENSITIVITY                                                                                                                          Radiology DG Chest 2 View Result Date: 08/23/2024 EXAM: 2 VIEW(S) XRAY OF THE CHEST 08/23/2024 01:11:00 PM COMPARISON: 04/01/2024. CLINICAL HISTORY:  chest pain FINDINGS: LUNGS AND PLEURA: Left base linear scarring, unchanged. No pleural effusion. No pneumothorax. HEART AND MEDIASTINUM: Aortic calcification. Prosthetic aortic valve. BONES AND SOFT TISSUES: Cervical spine surgical hardware. Median sternotomy noted. Thoracic degenerative changes. IMPRESSION: 1. No acute cardiopulmonary abnormality. Electronically signed by: Waddell Calk MD 08/23/2024 01:18 PM EST RP Workstation: [id number]    Pertinent labs & imaging results that  were available during my care of the patient were reviewed by me and considered in my medical decision making (see MDM for details).  Medications Ordered in ED Medications  sodium chloride  0.9 % bolus 500 mL (has no administration in time range)  alum & mag hydroxide-simeth (MAALOX/MYLANTA) 200-200-20 MG/5ML suspension 30 mL (30 mLs Oral Given 08/23/24 1333)                                                                                                                                     Procedures Procedures  (including critical care time)  Medical Decision Making / ED Course    Medical Decision Making:    Erik Salazar is a 74 y.o. male with past medical history as below, significant for ascending aortic aneurysm, ischemic stroke, seizure disorder, CABG, ventral hernia,, hypertension, seizure disorder who presents to the ED with complaint of chest pain. The complaint involves an extensive differential diagnosis and also carries with it a high risk of complications and morbidity.  Serious etiology was considered. Ddx includes but is not limited to: Differential includes all life-threatening causes for chest pain. This includes but is not exclusive to acute coronary syndrome, aortic dissection, pulmonary embolism, cardiac tamponade, community-acquired pneumonia, pericarditis, musculoskeletal chest wall pain, etc.   Complete initial physical exam performed, notably the patient was in no acute distress, HDS.     Reviewed and confirmed nursing documentation for past medical history, family history, social history.  Vital signs reviewed.    Chest pain History of CABG, aortic aneurysm, aortic valve replacement> - Reports symptoms today feel similar to when he had prior open heart surgery. - EKG is similar to prior, nonischemic.  Troponin initially less than 15 - Chest x-ray stable - Give GI cocktail - CT PE       ***               Additional history obtained: -Additional history obtained from {wsadditionalhistorian:28072} -External records from outside source obtained and reviewed including: Chart review including previous notes, labs, imaging, consultation notes including  ***   Lab Tests: -I ordered, reviewed, and interpreted labs.   The pertinent results include:   Labs Reviewed  BASIC METABOLIC PANEL WITH GFR - Abnormal; Notable for the following components:      Result Value   Glucose, Bld 105 (*)    All other components within normal limits  RESP PANEL BY RT-PCR (RSV, FLU A&B, COVID)  RVPGX2  CBC  HEPATIC FUNCTION PANEL  LIPASE, BLOOD  TROPONIN T, HIGH SENSITIVITY    Notable for ***  EKG   EKG Interpretation Date/Time:  Sunday August 23 2024 12:46:12 EST Ventricular Rate:  95 PR Interval:  172 QRS Duration:  140 QT Interval:  371 QTC Calculation: 467 R Axis:   59  Text Interpretation: Sinus rhythm Ventricular premature complex Right bundle branch block similar to prior tracing Confirmed by Elnor Savant ([id number]) on  08/23/2024 12:50:35 PM         Imaging Studies ordered: I ordered imaging studies including *** I independently visualized the following imaging with scope of interpretation limited to determining acute life threatening conditions related to emergency care; findings noted above I agree with the radiologist interpretation If any imaging was obtained with contrast I closely monitored patient for any possible adverse reaction a/w contrast  administration in the emergency department   Medicines ordered and prescription drug management: Meds ordered this encounter  Medications   alum & mag hydroxide-simeth (MAALOX/MYLANTA) 200-200-20 MG/5ML suspension 30 mL   sodium chloride  0.9 % bolus 500 mL    -I have reviewed the patients home medicines and have made adjustments as needed   Consultations Obtained: I requested consultation with the ***,  and discussed lab and imaging findings as well as pertinent plan - they recommend: ***   Cardiac Monitoring: The patient was maintained on a cardiac monitor.  I personally viewed and interpreted the cardiac monitored which showed an underlying rhythm of: *** Continuous pulse oximetry interpreted by myself, ***% on ***.    Social Determinants of Health:  Diagnosis or treatment significantly limited by social determinants of health: {wssoc:28071}   Reevaluation: After the interventions noted above, I reevaluated the patient and found that they have {resolved/improved/worsened:23923::improved}  Co morbidities that complicate the patient evaluation  Past Medical History:  Diagnosis Date   Allergy see attached info   Arthritis    RA and OA   Blood transfusion without reported diagnosis    with 2nd heart surgery   Carpal tunnel syndrome on right 07/08/2017   Cervical radiculopathy at C6 11/14/2017   Right   Complication of anesthesia    Coronary artery disease    Enlarged prostate    Fibromyalgia    H/O: knee surgery    15    Hayfever    WHEN YOUNG   History of blood clots    History of kidney stones    History of open heart surgery    ASCENDING AORTIC ANEURYSM   Ischemic stroke of frontal lobe (HCC) 03/14/2017   Bilateral; post-redo CT surgery   Pneumonia    PONV (postoperative nausea and vomiting)    only after CABG surgeries   Seizure disorder (HCC) 03/14/2017   Seizures (HCC)    last one 02/2021,updated 09/25/22   Spinal stenosis in cervical region 05/04/2020    Status post knee surgery    DVT POST KNEE SURGERY   Stroke [facility])       Dispostion: Disposition decision including need for hospitalization was considered, and patient {wsdispo:28070::discharged from emergency department.}    Final Clinical Impression(s) / ED Diagnoses Final diagnoses:  None         [1]  Social History Tobacco Use   Smoking status: Former    Types: Cigars    Quit date: 2021    Years since quitting: 5.0   Smokeless tobacco: Never   Tobacco comments:    Only smoked cigars for 6 months  Vaping Use   Vaping status: Never Used  Substance Use Topics   Alcohol use: Not Currently   Drug use: No   "

## 2024-08-23 NOTE — ED Notes (Signed)
 Patient transported to X-ray

## 2024-08-23 NOTE — ED Triage Notes (Signed)
 Mid chest pain, nausea for 2 days. Denies SHOB, emesis, fevers  Also reports cough and congestion

## 2024-12-14 ENCOUNTER — Ambulatory Visit

## 2025-01-19 ENCOUNTER — Ambulatory Visit

## 2025-01-27 ENCOUNTER — Ambulatory Visit: Admitting: Family

## 2025-03-04 ENCOUNTER — Ambulatory Visit: Admitting: Neurology
